# Patient Record
Sex: Female | Born: 1990
Health system: Southern US, Community
[De-identification: ages and names within clinical notes are randomized; demographics above are authoritative.]

## PROBLEM LIST (undated history)

## (undated) DIAGNOSIS — G4730 Sleep apnea, unspecified: Secondary | ICD-10-CM

## (undated) DIAGNOSIS — F319 Bipolar disorder, unspecified: Secondary | ICD-10-CM

## (undated) DIAGNOSIS — R159 Full incontinence of feces: Secondary | ICD-10-CM

## (undated) DIAGNOSIS — R519 Headache, unspecified: Secondary | ICD-10-CM

## (undated) DIAGNOSIS — M1990 Unspecified osteoarthritis, unspecified site: Secondary | ICD-10-CM

## (undated) DIAGNOSIS — F32A Depression, unspecified: Secondary | ICD-10-CM

## (undated) DIAGNOSIS — F79 Unspecified intellectual disabilities: Secondary | ICD-10-CM

## (undated) DIAGNOSIS — F329 Major depressive disorder, single episode, unspecified: Secondary | ICD-10-CM

## (undated) DIAGNOSIS — E119 Type 2 diabetes mellitus without complications: Secondary | ICD-10-CM

## (undated) HISTORY — PX: TOOTH EXTRACTION: SUR596

## (undated) HISTORY — DX: Unspecified intellectual disabilities: F79

## (undated) HISTORY — DX: Full incontinence of feces: R15.9

## (undated) HISTORY — PX: FOOT SURGERY: SHX648

---
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
## 2009-01-13 ENCOUNTER — Other Ambulatory Visit: Admission: RE | Admit: 2009-01-13 | Discharge: 2009-01-13 | Payer: Self-pay | Admitting: Family Medicine

## 2010-04-14 ENCOUNTER — Other Ambulatory Visit: Admission: RE | Admit: 2010-04-14 | Discharge: 2010-04-14 | Payer: Self-pay | Admitting: Family Medicine

## 2011-03-07 ENCOUNTER — Ambulatory Visit: Payer: Medicaid Other | Attending: Orthopedic Surgery | Admitting: Physical Therapy

## 2011-03-07 DIAGNOSIS — IMO0001 Reserved for inherently not codable concepts without codable children: Secondary | ICD-10-CM | POA: Insufficient documentation

## 2011-03-07 DIAGNOSIS — M25669 Stiffness of unspecified knee, not elsewhere classified: Secondary | ICD-10-CM | POA: Insufficient documentation

## 2011-03-07 DIAGNOSIS — M25569 Pain in unspecified knee: Secondary | ICD-10-CM | POA: Insufficient documentation

## 2011-03-08 ENCOUNTER — Encounter: Payer: Medicaid Other | Admitting: Physical Therapy

## 2011-03-14 ENCOUNTER — Ambulatory Visit: Payer: Medicaid Other | Admitting: Physical Therapy

## 2011-03-29 ENCOUNTER — Encounter: Payer: Medicaid Other | Admitting: Physical Therapy

## 2011-04-04 ENCOUNTER — Ambulatory Visit: Payer: Medicaid Other | Attending: Orthopedic Surgery | Admitting: Physical Therapy

## 2011-04-04 DIAGNOSIS — M25669 Stiffness of unspecified knee, not elsewhere classified: Secondary | ICD-10-CM | POA: Insufficient documentation

## 2011-04-04 DIAGNOSIS — IMO0001 Reserved for inherently not codable concepts without codable children: Secondary | ICD-10-CM | POA: Insufficient documentation

## 2011-04-04 DIAGNOSIS — M25569 Pain in unspecified knee: Secondary | ICD-10-CM | POA: Insufficient documentation

## 2011-04-18 ENCOUNTER — Ambulatory Visit: Payer: Medicaid Other | Admitting: Physical Therapy

## 2011-05-30 LAB — RAPID URINE DRUG SCREEN, HOSP PERFORMED
Barbiturates: NOT DETECTED
Benzodiazepines: NOT DETECTED
Cocaine: NOT DETECTED

## 2011-05-30 LAB — COMPREHENSIVE METABOLIC PANEL
Alkaline Phosphatase: 74
BUN: 9
Chloride: 107
Creatinine, Ser: 0.73
Glucose, Bld: 93
Potassium: 3.9
Total Bilirubin: 0.8

## 2011-05-30 LAB — DIFFERENTIAL
Basophils Absolute: 0.1
Eosinophils Absolute: 0.1
Lymphocytes Relative: 27
Monocytes Absolute: 0.4
Neutrophils Relative %: 64

## 2011-05-30 LAB — URINALYSIS, ROUTINE W REFLEX MICROSCOPIC
Bilirubin Urine: NEGATIVE
Glucose, UA: NEGATIVE
Ketones, ur: NEGATIVE
Leukocytes, UA: NEGATIVE
Nitrite: NEGATIVE
Protein, ur: 30 — AB
pH: 6

## 2011-05-30 LAB — CBC
HCT: 37.6
Hemoglobin: 12.8
MCV: 88.3
Platelets: 113 — ABNORMAL LOW
RDW: 13
WBC: 5.2

## 2011-05-30 LAB — URINE MICROSCOPIC-ADD ON

## 2011-05-30 LAB — ETHANOL: Alcohol, Ethyl (B): <5

## 2011-07-30 ENCOUNTER — Emergency Department (INDEPENDENT_AMBULATORY_CARE_PROVIDER_SITE_OTHER)
Admission: EM | Admit: 2011-07-30 | Discharge: 2011-07-30 | Disposition: A | Discharge status: Nursing Home (Includes ICF) | Payer: Medicare Other | Source: Home / Self Care | Attending: Emergency Medicine | Admitting: Emergency Medicine

## 2011-07-30 ENCOUNTER — Encounter (HOSPITAL_COMMUNITY): Payer: Self-pay | Admitting: *Deleted

## 2011-07-30 ENCOUNTER — Emergency Department (HOSPITAL_COMMUNITY)
Admission: EM | Admit: 2011-07-30 | Discharge: 2011-07-31 | Disposition: A | Discharge status: Home / Self Care | Payer: Medicaid Other | Attending: Emergency Medicine | Admitting: Emergency Medicine

## 2011-07-30 ENCOUNTER — Encounter: Payer: Self-pay | Admitting: Emergency Medicine

## 2011-07-30 ENCOUNTER — Emergency Department (INDEPENDENT_AMBULATORY_CARE_PROVIDER_SITE_OTHER): Payer: Medicare Other

## 2011-07-30 DIAGNOSIS — R109 Unspecified abdominal pain: Secondary | ICD-10-CM

## 2011-07-30 DIAGNOSIS — K5900 Constipation, unspecified: Secondary | ICD-10-CM | POA: Insufficient documentation

## 2011-07-30 DIAGNOSIS — R1110 Vomiting, unspecified: Secondary | ICD-10-CM | POA: Insufficient documentation

## 2011-07-30 LAB — POCT PREGNANCY, URINE: Preg Test, Ur: NEGATIVE

## 2011-07-30 IMAGING — CR DG ABDOMEN 1V
1 series · 1 of 1 positions shown · non-contrast
Comparison: None.

CLINICAL DATA: Left flank pain for 2-3 weeks

ABDOMEN - 1 VIEW

[view not recorded]
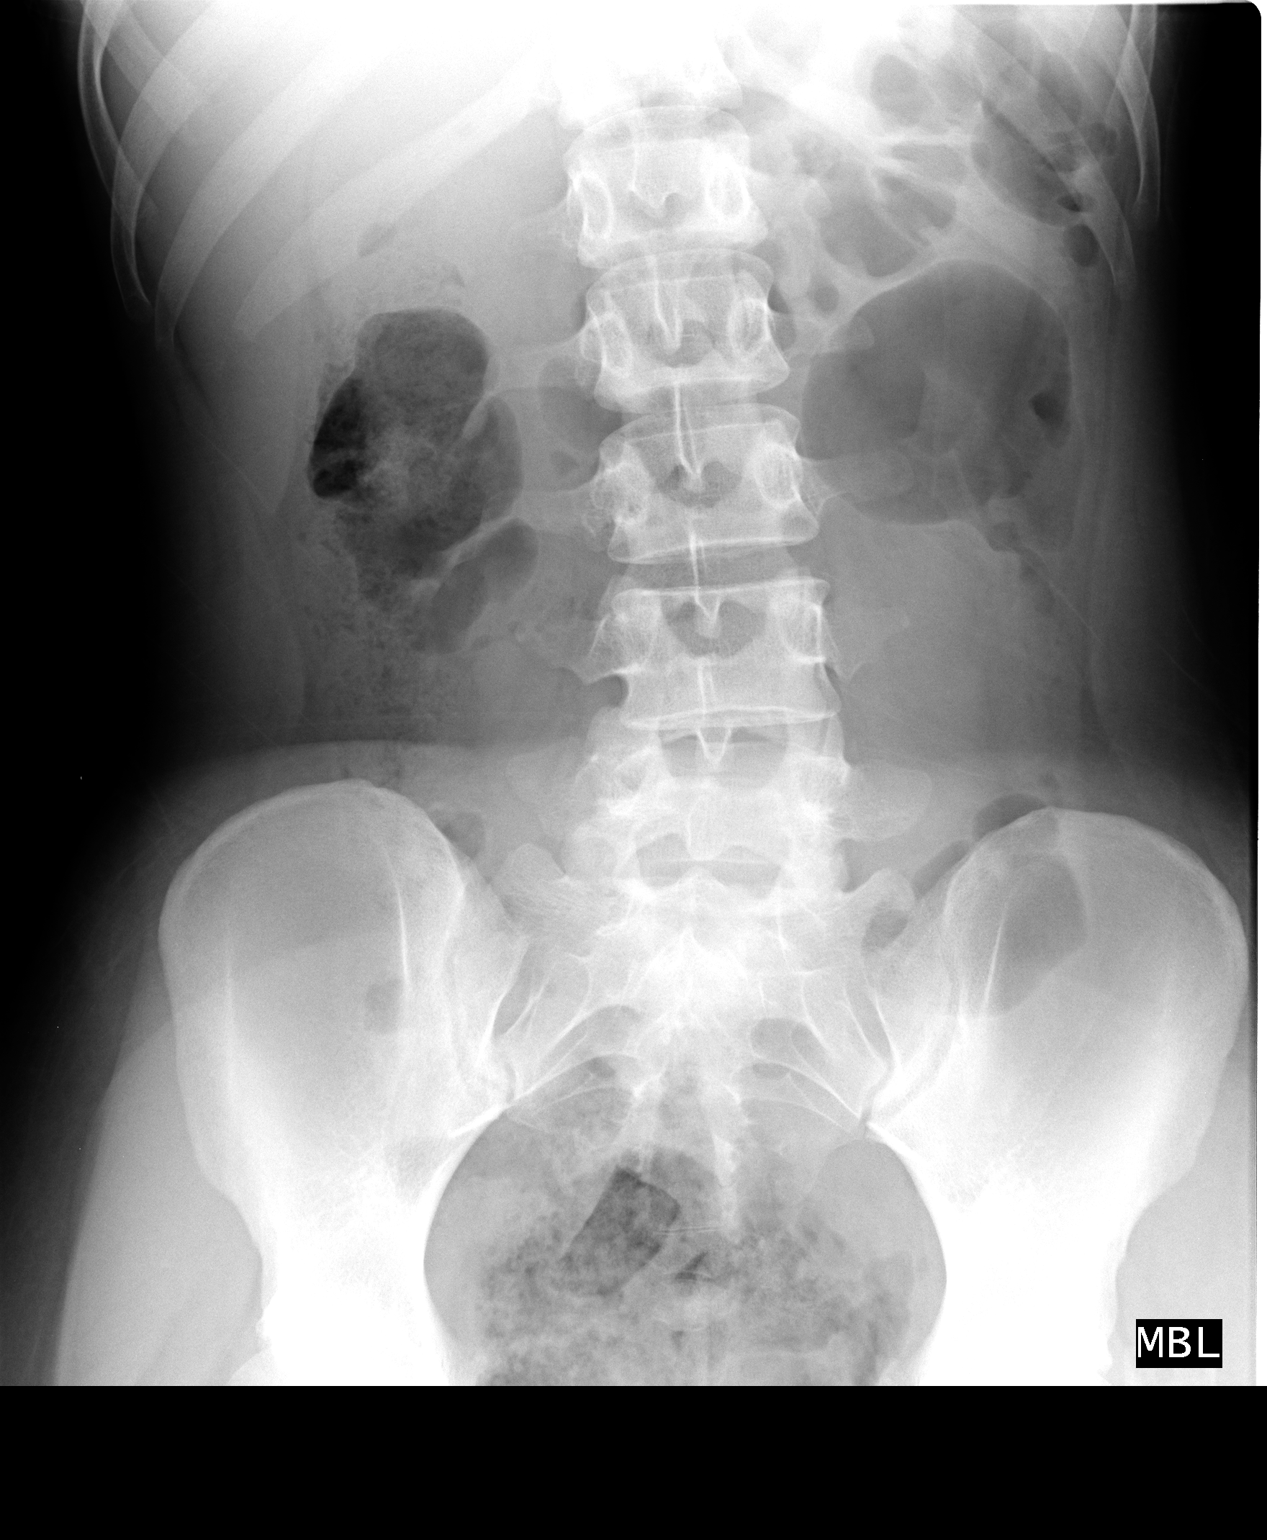

[1 of 1 positions shown; findings below may reference images not displayed]

FINDINGS: There is focal gaseous distention of a portion of the
bowel loop in the left abdomen.  This is favored to be focal
dilatation of a small segment of the colon. Otherwise, the bowel
gas pattern is within normal limits.  No significant stool burden.
No radiopaque urinary tract calculus. No evidence of free
peritoneal air on this supine view of the abdomen.  No acute or
suspicious bony abnormality.
IMPRESSION: Focal bowel dilatation in the left abdomen, favored to be colonic.
It is uncertain if this could be transient gaseous distention
related to peristalsis, or pathologic dilatation.

## 2011-07-30 NOTE — ED Provider Notes (Signed)
History     CSN: [id number] Arrival date & time: 07/30/2011  5:32 PM   First MD Initiated Contact with Patient 07/30/11 1707      Chief Complaint  Patient presents with  . Abdominal Cramping  . Constipation  . Emesis    (Consider location/radiation/quality/duration/timing/severity/associated sxs/prior treatment) Patient is a 20 y.o. female presenting with cramps, constipation, and vomiting. The history is provided by the patient and a relative.  Abdominal Cramping The primary symptoms of the illness include abdominal pain and vomiting. The primary symptoms of the illness do not include fever, fatigue, shortness of breath or vaginal discharge. The current episode started more than 2 days ago (patient vague historian" I don't know maybe 2 weeks"). The problem has been gradually worsening.  Additional symptoms associated with the illness include constipation. Symptoms associated with the illness do not include chills, anorexia, diaphoresis, urgency, hematuria, frequency or back pain. Significant associated medical issues do not include PUD.  Constipation  Associated symptoms include abdominal pain and vomiting. Pertinent negatives include no anorexia, no fever, no hematuria, no vaginal discharge and no coughing.  Emesis  Associated symptoms include abdominal pain. Pertinent negatives include no chills, no cough and no fever.    History reviewed. No pertinent past medical history.  History reviewed. No pertinent past surgical history.  No family history on file.  History  Substance Use Topics  . Smoking status: Never Smoker   . Smokeless tobacco: Not on file  . Alcohol Use: No    OB History    Grav Para Term Preterm Abortions TAB SAB Ect Mult Living                  Review of Systems  Constitutional: Negative for fever, chills, diaphoresis and fatigue.  Respiratory: Negative for cough and shortness of breath.   Gastrointestinal: Positive for vomiting, abdominal pain and  constipation. Negative for anorexia.  Genitourinary: Negative for urgency, frequency, hematuria and vaginal discharge.  Musculoskeletal: Negative for back pain.    Allergies  Review of patient's allergies indicates no known allergies.  Home Medications   Current Outpatient Rx  Name Route Sig Dispense Refill  . ARIPIPRAZOLE 30 MG PO TABS Oral Take 30 mg by mouth daily.      . SERTRALINE HCL 50 MG PO TABS Oral Take 50 mg by mouth daily.        BP 127/80  Pulse 86  Temp(Src) 98.1 F (36.7 C) (Oral)  Resp 16  SpO2 100%  Physical Exam  Nursing note and vitals reviewed. Constitutional: She appears well-developed and well-nourished. No distress.  HENT:  Head: Normocephalic.  Eyes: Conjunctivae are normal. Pupils are equal, round, and reactive to light.  Abdominal: Soft. She exhibits no shifting dullness, no distension, no ascites, no pulsatile midline mass and no mass. There is no hepatosplenomegaly. There is tenderness in the right lower quadrant, periumbilical area and left upper quadrant. There is rigidity. There is no rebound, no guarding, no tenderness at McBurney's point and negative Murphy's sign. No hernia.  Lymphadenopathy:    She has no cervical adenopathy.    ED Course  Procedures (including critical care time)   Labs Reviewed  POCT PREGNANCY, URINE  POCT PREGNANCY, URINE   Dg Abd 1 View  07/30/2011  *RADIOLOGY REPORT*  Clinical Data: Left flank pain for 2-3 weeks  ABDOMEN - 1 VIEW  Comparison: None.  Findings: There is focal gaseous distention of a portion of the bowel loop in the left abdomen.  This  is favored to be focal dilatation of a small segment of the colon. Otherwise, the bowel gas pattern is within normal limits.  No significant stool burden. No radiopaque urinary tract calculus. No evidence of free peritoneal air on this supine view of the abdomen.  No acute or suspicious bony abnormality.  IMPRESSION: Focal bowel dilatation in the left abdomen, favored to  be colonic. It is uncertain if this could be transient gaseous distention related to peristalsis, or pathologic dilatation.  Original Report Authenticated By: Britta Mccreedy, M.D.     1. Abdominal pain   2. Constipation       MDM  Ongoing abdominal pian x 2 weeks- Concerning exam- single small bowel loop (isolated) pathological? No fevers, No diarrheas. Afebrile transferred to ED for further evaluation.        Cindy Molly, MD 07/30/11 [phone]

## 2011-07-30 NOTE — ED Notes (Signed)
Pt c/o lt and pain for 2 weeks no nv or diarrhea

## 2011-07-30 NOTE — ED Notes (Signed)
Pt here with c/o abdominal intermitt cramping and pain in lower abd with vomiting and unable to tell the last time she had bm.sx started 07/23/11.no fever,chills.pt reports of x 5 emesis today

## 2011-08-06 ENCOUNTER — Encounter (HOSPITAL_COMMUNITY): Payer: Self-pay | Admitting: Emergency Medicine

## 2011-08-06 ENCOUNTER — Emergency Department (HOSPITAL_COMMUNITY): Payer: Medicaid Other

## 2011-08-06 ENCOUNTER — Emergency Department (HOSPITAL_COMMUNITY)
Admission: EM | Admit: 2011-08-06 | Discharge: 2011-08-06 | Disposition: A | Discharge status: Home / Self Care | Payer: Medicaid Other | Attending: Emergency Medicine | Admitting: Emergency Medicine

## 2011-08-06 DIAGNOSIS — R51 Headache: Secondary | ICD-10-CM | POA: Insufficient documentation

## 2011-08-06 DIAGNOSIS — K5904 Chronic idiopathic constipation: Secondary | ICD-10-CM

## 2011-08-06 DIAGNOSIS — R1084 Generalized abdominal pain: Secondary | ICD-10-CM | POA: Insufficient documentation

## 2011-08-06 DIAGNOSIS — K5909 Other constipation: Secondary | ICD-10-CM | POA: Insufficient documentation

## 2011-08-06 LAB — URINALYSIS, ROUTINE W REFLEX MICROSCOPIC
Glucose, UA: NEGATIVE mg/dL
Ketones, ur: 15 mg/dL — AB
Leukocytes, UA: NEGATIVE
Nitrite: NEGATIVE
Protein, ur: NEGATIVE mg/dL

## 2011-08-06 LAB — POCT PREGNANCY, URINE: Preg Test, Ur: NEGATIVE

## 2011-08-06 IMAGING — CR DG ABDOMEN 1V
2 series · 2 of 2 positions shown · non-contrast
Comparison: [DATE]

CLINICAL DATA: Lower abdominal pain, constipation.

ABDOMEN - 1 VIEW

[t abdomen supine (1 of 2)]
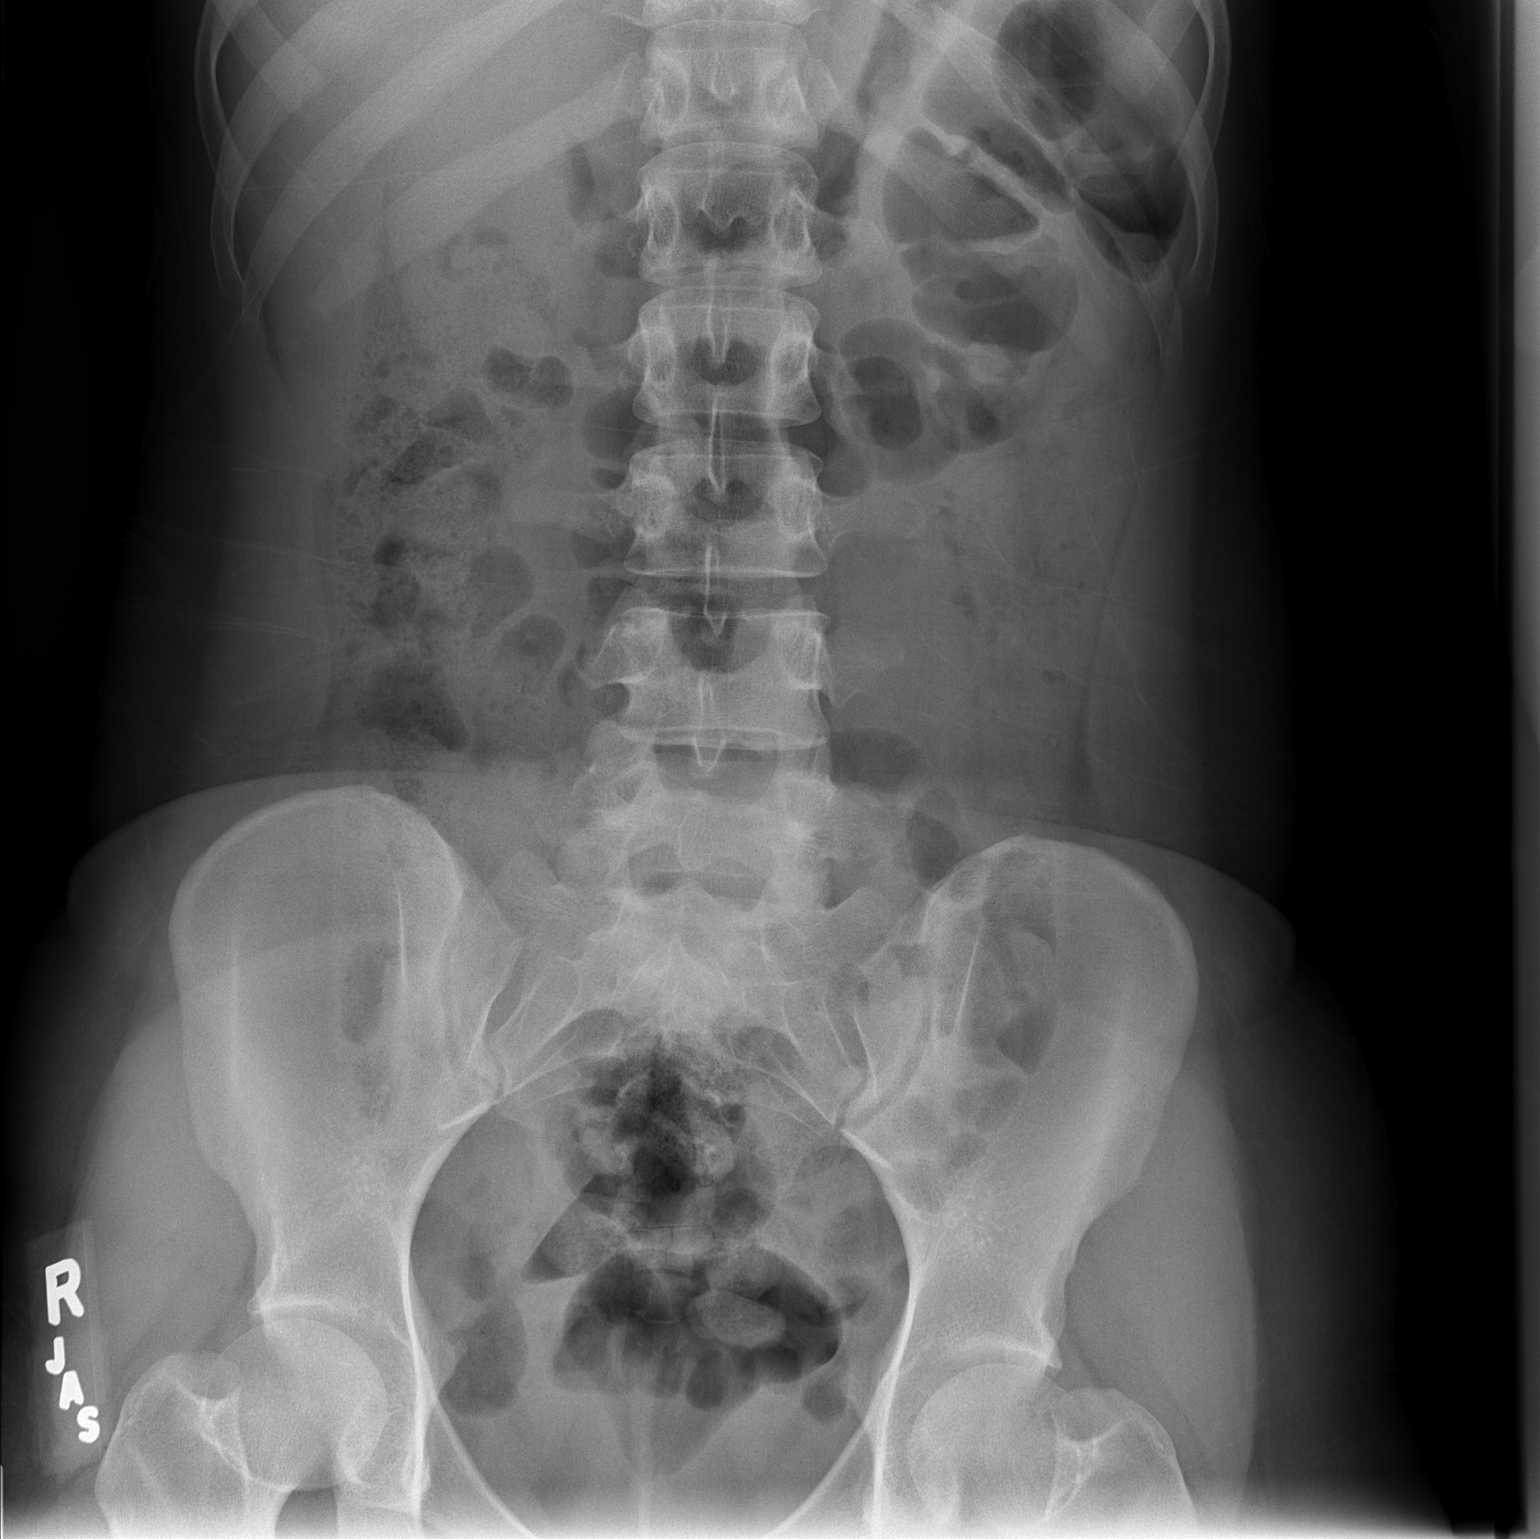

[t abdomen supine (2 of 2)]
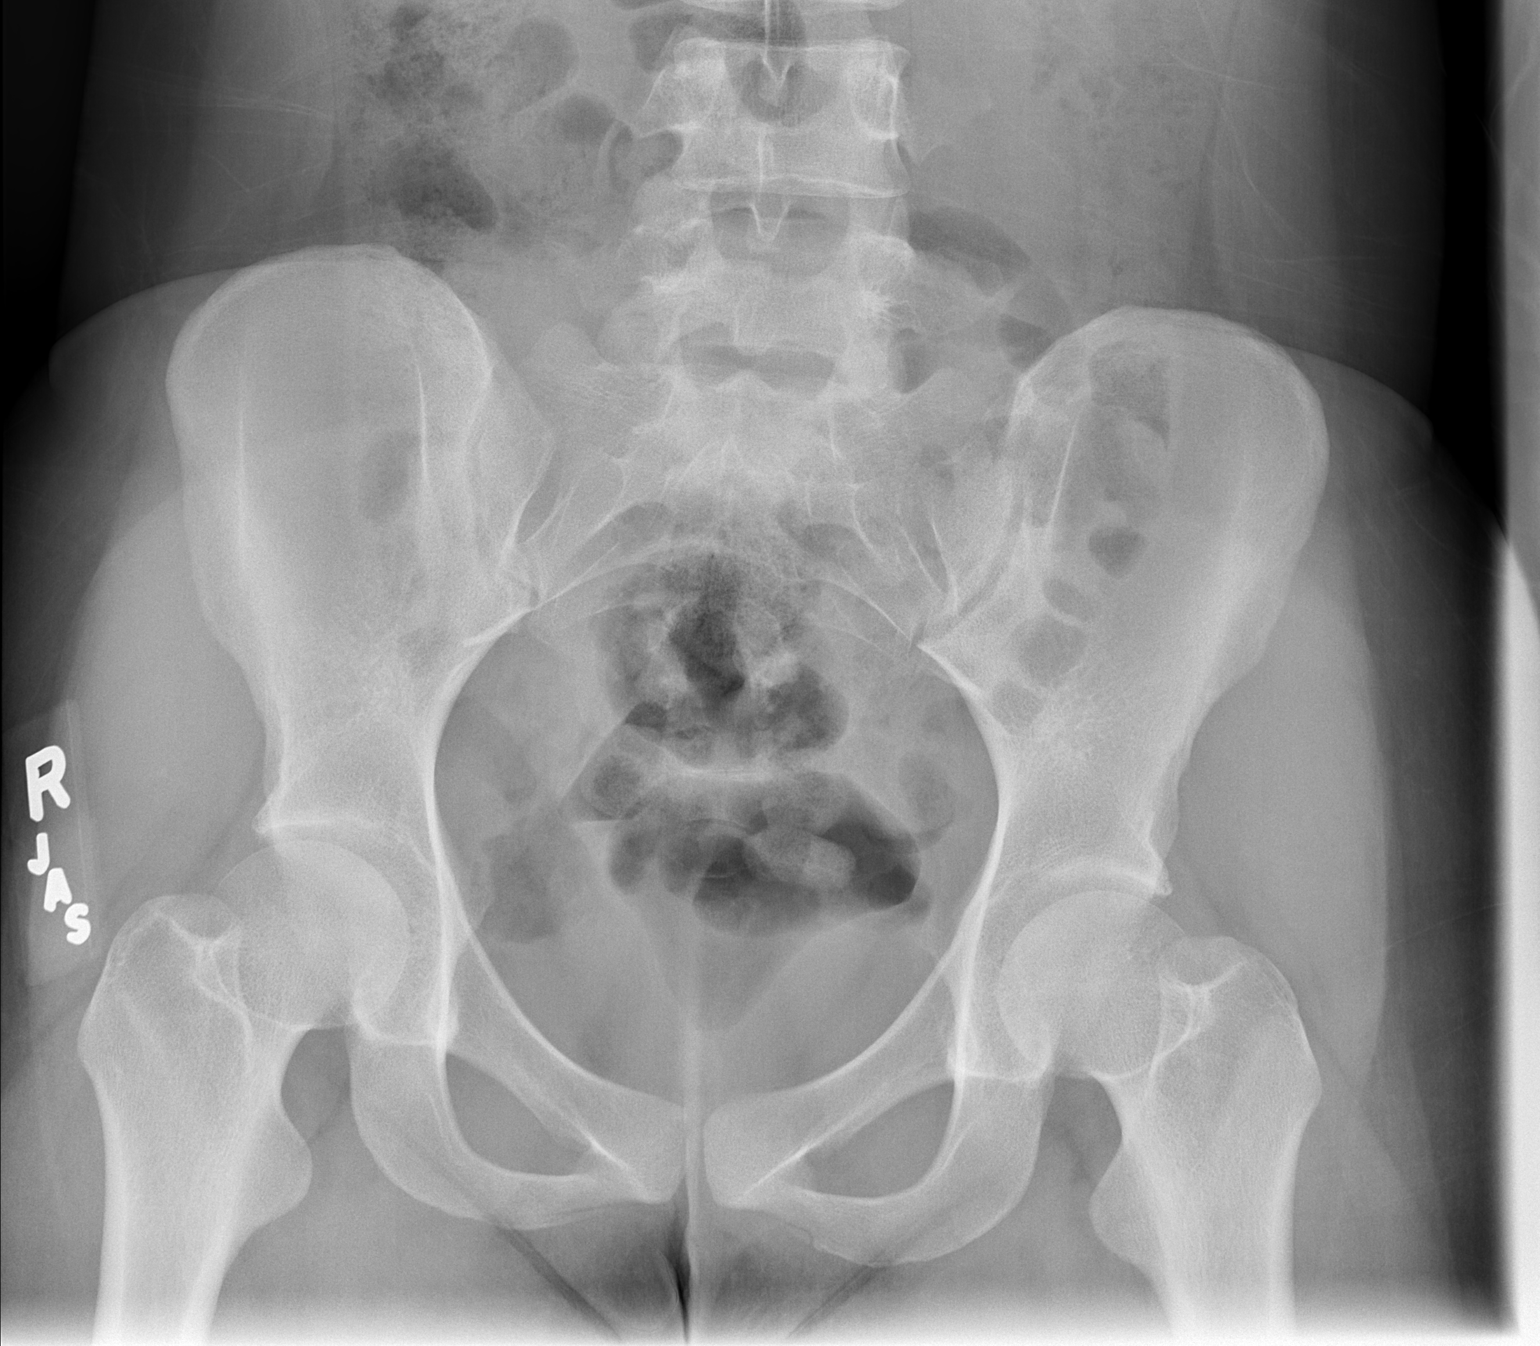

[2 of 2 positions shown; findings below may reference images not displayed]

FINDINGS: Hemidiaphragms are excluded from the image.
Nonobstructive bowel gas pattern. No abnormal loops of dilatation.
Organ outlines normal where seen.  No acute osseous abnormality.
IMPRESSION: Nonobstructive bowel gas pattern.

## 2011-08-06 MED ORDER — KETOROLAC TROMETHAMINE 30 MG/ML IJ SOLN: 30.0000 mg | Freq: Once | INTRAMUSCULAR | Status: DC

## 2011-08-06 MED ORDER — KETOROLAC TROMETHAMINE 60 MG/2ML IM SOLN
30.0000 mg | Freq: Once | INTRAMUSCULAR | Status: AC
  Administered 2011-08-06: 30 mg via INTRAMUSCULAR

## 2011-08-06 MED ORDER — KETOROLAC TROMETHAMINE 60 MG/2ML IM SOLN
INTRAMUSCULAR | Status: AC
  Administered 2011-08-06: 30 mg via INTRAMUSCULAR
  Filled 2011-08-06: qty 2

## 2011-08-06 MED ORDER — BISACODYL 5 MG PO TBEC: 5.0000 mg | DELAYED_RELEASE_TABLET | Freq: Every day | ORAL | Status: AC | PRN

## 2011-08-06 NOTE — ED Notes (Signed)
rx x 1, pt voiced understanding to f/u with [facility] and return for worsening of condition

## 2011-08-06 NOTE — ED Provider Notes (Signed)
History     CSN: [id number] Arrival date & time: 08/06/2011  4:27 PM   None     Chief Complaint  Patient presents with  . Abdominal Pain  . Headache    (Consider location/radiation/quality/duration/timing/severity/associated sxs/prior treatment) HPI Comments: Patient reports, that she has not had a bowel movement in over 2, weeks.  She's tried any over-the-counter meds to treat and MiraLAX without results.  Now having mild diffuse abdominal discomfort.  Also reports a frontal headache for a week has intermittently tried ibuprofen by mouth without relief.  She last took any medication for her headache 3 days ago.  Denies nasal congestion, seasonal allergies, sore throat, UTI symptoms, visual disturbances blurry vision, change in her eyeglass prescription.  Patient is a 20 y.o. female presenting with abdominal pain and headaches. The history is provided by the patient.  Abdominal Pain The primary symptoms of the illness include abdominal pain. The primary symptoms of the illness do not include fever, shortness of breath, nausea, vomiting, diarrhea, dysuria or vaginal discharge. The current episode started more than 2 days ago. The onset of the illness was gradual. The problem has been gradually worsening.  The abdominal pain began more than 2 days ago. The pain came on gradually. The abdominal pain has been gradually worsening since its onset. The abdominal pain is generalized. The severity of the abdominal pain is 6/10. The abdominal pain is exacerbated by certain positions.  The patient states that she believes she is currently not pregnant. Additional symptoms associated with the illness include constipation. Symptoms associated with the illness do not include frequency or back pain.  Headache  Pertinent negatives include no fever, no shortness of breath, no nausea and no vomiting.    History reviewed. No pertinent past medical history.  History reviewed. No pertinent past surgical  history.  History reviewed. No pertinent family history.  History  Substance Use Topics  . Smoking status: Never Smoker   . Smokeless tobacco: Not on file  . Alcohol Use: No    OB History    Grav Para Term Preterm Abortions TAB SAB Ect Mult Living                  Review of Systems  Constitutional: Negative for fever.  HENT: Negative for congestion, sore throat, rhinorrhea, neck pain, dental problem, sinus pressure and ear discharge.   Eyes: Negative for visual disturbance.  Respiratory: Negative for cough and shortness of breath.   Cardiovascular: Negative for chest pain.  Gastrointestinal: Positive for abdominal pain and constipation. Negative for nausea, vomiting and diarrhea.  Genitourinary: Negative for dysuria, frequency and vaginal discharge.  Musculoskeletal: Negative for back pain.  Skin: Negative.   Neurological: Positive for headaches. Negative for dizziness and weakness.  Hematological: Negative.   Psychiatric/Behavioral: Negative.     Allergies  Review of patient's allergies indicates no known allergies.  Home Medications   Current Outpatient Rx  Name Route Sig Dispense Refill  . ARIPIPRAZOLE 30 MG PO TABS Oral Take 30 mg by mouth daily.     . SERTRALINE HCL 50 MG PO TABS Oral Take 50 mg by mouth daily.       BP 93/59  Pulse 74  Temp(Src) 97.4 F (36.3 C) (Oral)  Resp 16  Ht 6' (1.829 m)  Wt 150 lb (68.04 kg)  BMI 20.34 kg/m2  SpO2 100%  Physical Exam  ED Course  Procedures (including critical care time)  Labs Reviewed  URINALYSIS, ROUTINE W REFLEX MICROSCOPIC - Abnormal;  Notable for the following:    Bilirubin Urine SMALL (*)    Ketones, ur 15 (*)    All other components within normal limits  POCT PREGNANCY, URINE  POCT PREGNANCY, URINE   Dg Abd 1 View  08/06/2011  *RADIOLOGY REPORT*  Clinical Data: Lower abdominal pain, constipation.  ABDOMEN - 1 VIEW  Comparison: 07/30/2011  Findings: Hemidiaphragms are excluded from the image.  Nonobstructive bowel gas pattern. No abnormal loops of dilatation. Organ outlines normal where seen.  No acute osseous abnormality.  IMPRESSION: Nonobstructive bowel gas pattern.  Original Report Authenticated By: Waneta Martins, M.D.     1. Constipation - functional   2. Headache       MDM  This is most likely simple constipation.  Will check a KUB.  Will treat headache with IM Toradol and reassess        Arman Filter, NP 08/06/11 2136  Arman Filter, NP 08/06/11 2318  Arman Filter, NP 08/06/11 2318  Arman Filter, NP 08/06/11 [phone]

## 2011-08-06 NOTE — ED Notes (Signed)
Pt c/o lower abd pain and HA x 1 week; pt does not know when last period was; pt denies UTI sx of vaginal discharge

## 2011-08-08 NOTE — ED Provider Notes (Signed)
Medical screening examination/treatment/procedure(s) were performed by non-physician practitioner and as supervising physician I was immediately available for consultation/collaboration.  Rayola Everhart, MD 08/08/11 0923 

## 2011-11-20 ENCOUNTER — Ambulatory Visit (INDEPENDENT_AMBULATORY_CARE_PROVIDER_SITE_OTHER): Payer: Medicare Other | Admitting: Family Medicine

## 2011-11-20 VITALS — BP 115/75 | HR 60 | Temp 98.6°F | Resp 16 | Ht 72.0 in | Wt 161.2 lb

## 2011-11-20 DIAGNOSIS — M25561 Pain in right knee: Secondary | ICD-10-CM

## 2011-11-20 DIAGNOSIS — F319 Bipolar disorder, unspecified: Secondary | ICD-10-CM

## 2011-11-20 DIAGNOSIS — M25559 Pain in unspecified hip: Secondary | ICD-10-CM

## 2011-11-20 DIAGNOSIS — M222X9 Patellofemoral disorders, unspecified knee: Secondary | ICD-10-CM

## 2011-11-20 DIAGNOSIS — F3289 Other specified depressive episodes: Secondary | ICD-10-CM

## 2011-11-20 DIAGNOSIS — M12269 Villonodular synovitis (pigmented), unspecified knee: Secondary | ICD-10-CM

## 2011-11-20 NOTE — Progress Notes (Signed)
  Subjective:    Patient ID: Cindy Phillips, female    DOB: May 12, 1991, 21 y.o.   MRN: [id number]  HPI Cindy Phillips is a 21 y.o. female Prior pt of [facility] - wants to transfer care here.  Will schedule physical and obtain records for establishing care.  R knee pain - started 2 weeks ago - NKI,  No prior R knee pain, but has sprained L knee 2/12.  Saw  Dr. Turner Daniels at [facility]. No swelling.  Tx: ice, no other tx's.  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  Non smoker.  Has form for therapy - will be having therapy with Cindy Phillips - to help with anger.  Hx of Bipolar disorder, psychiatrist at [facility] center.     Review of Systems  Psychiatric/Behavioral: Positive for dysphoric mood. The patient is nervous/anxious.        Objective:   Physical Exam  Constitutional: She is oriented to person, place, and time. She appears well-developed and well-nourished.  HENT:  Head: Normocephalic and atraumatic.  Pulmonary/Chest: Effort normal.  Musculoskeletal:       Right knee: She exhibits normal range of motion, no swelling, no effusion, no ecchymosis, no deformity, normal alignment, no LCL laxity and normal patellar mobility.       Legs: Neurological: She is alert and oriented to person, place, and time.  Skin: Skin is warm and dry.  Psychiatric: She has a normal mood and affect. Her behavior is normal.       Good eye contact.       Assessment & Plan:  Cindy Phillips is a 21 y.o. female 1.. right knee pain. suspected patellofemoral pain syndrome. No apparent effusion on exam guarded for anterior cruciate ligament testing but overall exam was reassuring. Hand out given from sports medicine database: Patellofemoral pain syndrome and discussed VMO strengthening. Can use over-the-counter ibuprofen or Aleve as needed and recheck in the next 2 weeks if not improving. Discussed x-ray today versus at recheck in 2 weeks - pt would prefer to wait until 2 weeks  recheck.  2. bipolar disorder, currently followed by psychiatrist at the [facility] center, and has a therapist scheduled. Paperwork completed for therapy/counseling.  Patient plans on arranging primary care here.  we'll obtain a release of information from her previous physician and can schedule a physical at [address].

## 2012-03-13 DIAGNOSIS — Z01419 Encounter for gynecological examination (general) (routine) without abnormal findings: Secondary | ICD-10-CM | POA: Diagnosis not present

## 2012-03-13 DIAGNOSIS — Z124 Encounter for screening for malignant neoplasm of cervix: Secondary | ICD-10-CM | POA: Diagnosis not present

## 2012-03-13 DIAGNOSIS — Z113 Encounter for screening for infections with a predominantly sexual mode of transmission: Secondary | ICD-10-CM | POA: Diagnosis not present

## 2012-03-28 DIAGNOSIS — Z3049 Encounter for surveillance of other contraceptives: Secondary | ICD-10-CM | POA: Diagnosis not present

## 2012-05-08 DIAGNOSIS — IMO0002 Reserved for concepts with insufficient information to code with codable children: Secondary | ICD-10-CM | POA: Diagnosis not present

## 2012-05-20 DIAGNOSIS — M1710 Unilateral primary osteoarthritis, unspecified knee: Secondary | ICD-10-CM | POA: Diagnosis not present

## 2012-05-28 DIAGNOSIS — M25569 Pain in unspecified knee: Secondary | ICD-10-CM | POA: Diagnosis not present

## 2012-06-03 ENCOUNTER — Ambulatory Visit: Payer: Self-pay

## 2012-06-04 ENCOUNTER — Ambulatory Visit (INDEPENDENT_AMBULATORY_CARE_PROVIDER_SITE_OTHER): Payer: Medicare Other | Admitting: Emergency Medicine

## 2012-06-04 VITALS — BP 110/72 | HR 80 | Temp 98.2°F | Resp 18 | Wt 162.0 lb

## 2012-06-04 DIAGNOSIS — H1030 Unspecified acute conjunctivitis, unspecified eye: Secondary | ICD-10-CM | POA: Diagnosis not present

## 2012-06-04 DIAGNOSIS — J018 Other acute sinusitis: Secondary | ICD-10-CM | POA: Diagnosis not present

## 2012-06-04 MED ORDER — AMOXICILLIN-POT CLAVULANATE 875-125 MG PO TABS: 1.0000 | ORAL_TABLET | Freq: Two times a day (BID) | ORAL | Status: DC

## 2012-06-04 MED ORDER — PSEUDOEPHEDRINE-GUAIFENESIN ER 60-600 MG PO TB12: 1.0000 | ORAL_TABLET | Freq: Two times a day (BID) | ORAL | Status: AC

## 2012-06-04 MED ORDER — SULFACETAMIDE SODIUM 10 % OP SOLN: 1.0000 [drp] | OPHTHALMIC | Status: DC

## 2012-06-04 NOTE — Progress Notes (Signed)
[facility] [address] [place] [address] [phone]  Date:  06/04/2012   Name:  Cindy Phillips   DOB:  October 18, 1990   MRN:  [id number]  PCP:  Per Patient No Pcp    Chief Complaint: Eye Pain   History of Present Illness:  Cindy Phillips is a 21 y.o. very pleasant female patient who presents with the following:  1 week duration of nasal congestion with a purulent drainage. No fever or chills.  Has non productive cough.  No wheezing or shortness of breath.  Has a sore throat.  This morning her left eye was glued shut.  Not painful and no light sensitivity or FB sensation.  Injected and red with yellow drainage.  There is no problem list on file for this patient.   No past medical history on file.  No past surgical history on file.  History  Substance Use Topics  . Smoking status: Never Smoker   . Smokeless tobacco: Not on file  . Alcohol Use: No    No family history on file.  No Known Allergies  Medication list has been reviewed and updated.  Current Outpatient Prescriptions on File Prior to Visit  Medication Sig Dispense Refill  . ARIPiprazole (ABILIFY) 30 MG tablet Take 10 mg by mouth daily.       . sertraline (ZOLOFT) 50 MG tablet Take 50 mg by mouth daily.         Review of Systems:  As per HPI, otherwise negative.    Physical Examination: Filed Vitals:   06/04/12 1502  BP: 110/72  Pulse: 80  Temp: 98.2 F (36.8 C)  Resp: 18   Filed Vitals:   06/04/12 1502  Weight: 162 lb (73.483 kg)   There is no height on file to calculate BMI. Ideal Body Weight:    GEN: WDWN, NAD, Non-toxic, A & O x 3.  No shortness of breath or sepsis or rash HEENT: Atraumatic, Normocephalic. Neck supple. No masses, No LAD.  Oropharynx negative.  Left scleral injection with drainage.  No FB or abrasion Ears and Nose: No external deformity.  TM negative.  Green nasal drainage. CV: RRR, No M/G/R. No JVD. No thrill. No extra heart sounds. PULM:  CTA B, no wheezes, crackles, rhonchi. No retractions. No resp. distress. No accessory muscle use. ABD: S, NT, ND, +BS. No rebound. No HSM. EXTR: No c/c/e NEURO Normal gait.  PSYCH: Normally interactive. Conversant. Not depressed or anxious appearing.  Calm demeanor.    Assessment and Plan: Conjunctivitis:  Sulfacetamide Sinusitis;  Augmentin, mucinex d Follow up as needed  Carmelina Dane, MD  I have reviewed and agree with documentation. Robert P. Merla Riches, M.D.

## 2012-06-05 ENCOUNTER — Ambulatory Visit: Payer: Medicare Other | Admitting: Rehabilitation

## 2012-06-12 ENCOUNTER — Ambulatory Visit: Payer: Medicare Other | Attending: Orthopedic Surgery | Admitting: Physical Therapy

## 2012-06-12 DIAGNOSIS — IMO0001 Reserved for inherently not codable concepts without codable children: Secondary | ICD-10-CM | POA: Diagnosis not present

## 2012-06-12 DIAGNOSIS — M25569 Pain in unspecified knee: Secondary | ICD-10-CM | POA: Insufficient documentation

## 2012-06-16 ENCOUNTER — Ambulatory Visit: Payer: Medicare Other | Admitting: Physical Therapy

## 2012-06-18 ENCOUNTER — Ambulatory Visit: Payer: Medicare Other | Admitting: Physical Therapy

## 2012-06-23 ENCOUNTER — Ambulatory Visit: Payer: Medicare Other | Attending: Orthopedic Surgery | Admitting: Physical Therapy

## 2012-06-23 DIAGNOSIS — IMO0001 Reserved for inherently not codable concepts without codable children: Secondary | ICD-10-CM | POA: Insufficient documentation

## 2012-06-23 DIAGNOSIS — M25569 Pain in unspecified knee: Secondary | ICD-10-CM | POA: Insufficient documentation

## 2012-06-25 ENCOUNTER — Ambulatory Visit: Payer: Medicare Other | Admitting: Physical Therapy

## 2012-06-30 ENCOUNTER — Ambulatory Visit: Payer: Medicare Other | Admitting: Physical Therapy

## 2012-07-02 ENCOUNTER — Encounter: Payer: Medicare Other | Admitting: Physical Therapy

## 2012-07-07 ENCOUNTER — Encounter: Payer: Medicare Other | Admitting: Physical Therapy

## 2012-07-08 ENCOUNTER — Ambulatory Visit: Payer: Medicare Other | Admitting: Physical Therapy

## 2012-07-09 ENCOUNTER — Ambulatory Visit: Payer: Medicare Other | Admitting: Physical Therapy

## 2012-07-14 ENCOUNTER — Encounter: Payer: Medicare Other | Admitting: Physical Therapy

## 2012-07-16 ENCOUNTER — Encounter: Payer: Medicare Other | Admitting: Physical Therapy

## 2012-07-16 DIAGNOSIS — M25569 Pain in unspecified knee: Secondary | ICD-10-CM | POA: Diagnosis not present

## 2012-08-26 DIAGNOSIS — M25569 Pain in unspecified knee: Secondary | ICD-10-CM | POA: Diagnosis not present

## 2012-09-02 DIAGNOSIS — M19019 Primary osteoarthritis, unspecified shoulder: Secondary | ICD-10-CM | POA: Diagnosis not present

## 2012-09-05 DIAGNOSIS — Z3202 Encounter for pregnancy test, result negative: Secondary | ICD-10-CM | POA: Diagnosis not present

## 2012-09-05 DIAGNOSIS — Z3161 Procreative counseling and advice using natural family planning: Secondary | ICD-10-CM | POA: Diagnosis not present

## 2012-09-08 DIAGNOSIS — F319 Bipolar disorder, unspecified: Secondary | ICD-10-CM | POA: Diagnosis not present

## 2012-09-09 DIAGNOSIS — M25569 Pain in unspecified knee: Secondary | ICD-10-CM | POA: Diagnosis not present

## 2012-09-24 DIAGNOSIS — F70 Mild intellectual disabilities: Secondary | ICD-10-CM | POA: Diagnosis not present

## 2012-09-24 DIAGNOSIS — F3132 Bipolar disorder, current episode depressed, moderate: Secondary | ICD-10-CM | POA: Diagnosis not present

## 2012-10-16 DIAGNOSIS — F70 Mild intellectual disabilities: Secondary | ICD-10-CM | POA: Diagnosis not present

## 2012-10-16 DIAGNOSIS — F3132 Bipolar disorder, current episode depressed, moderate: Secondary | ICD-10-CM | POA: Diagnosis not present

## 2012-11-17 DIAGNOSIS — F319 Bipolar disorder, unspecified: Secondary | ICD-10-CM | POA: Diagnosis not present

## 2012-11-25 DIAGNOSIS — H6690 Otitis media, unspecified, unspecified ear: Secondary | ICD-10-CM | POA: Diagnosis not present

## 2012-11-25 DIAGNOSIS — Z3009 Encounter for other general counseling and advice on contraception: Secondary | ICD-10-CM | POA: Diagnosis not present

## 2013-01-19 DIAGNOSIS — F319 Bipolar disorder, unspecified: Secondary | ICD-10-CM | POA: Diagnosis not present

## 2013-02-11 DIAGNOSIS — R040 Epistaxis: Secondary | ICD-10-CM | POA: Diagnosis not present

## 2013-05-12 DIAGNOSIS — N390 Urinary tract infection, site not specified: Secondary | ICD-10-CM | POA: Diagnosis not present

## 2013-06-11 DIAGNOSIS — F319 Bipolar disorder, unspecified: Secondary | ICD-10-CM | POA: Diagnosis not present

## 2013-07-28 DIAGNOSIS — F319 Bipolar disorder, unspecified: Secondary | ICD-10-CM | POA: Diagnosis not present

## 2013-08-06 DIAGNOSIS — F70 Mild intellectual disabilities: Secondary | ICD-10-CM | POA: Diagnosis not present

## 2013-08-06 DIAGNOSIS — F3132 Bipolar disorder, current episode depressed, moderate: Secondary | ICD-10-CM | POA: Diagnosis not present

## 2013-09-23 DIAGNOSIS — N390 Urinary tract infection, site not specified: Secondary | ICD-10-CM | POA: Diagnosis not present

## 2013-09-23 DIAGNOSIS — N912 Amenorrhea, unspecified: Secondary | ICD-10-CM | POA: Diagnosis not present

## 2013-10-21 ENCOUNTER — Other Ambulatory Visit: Payer: Self-pay | Admitting: Family Medicine

## 2013-10-21 ENCOUNTER — Other Ambulatory Visit (HOSPITAL_COMMUNITY)
Admission: RE | Admit: 2013-10-21 | Discharge: 2013-10-21 | Disposition: A | Discharge status: Home / Self Care | Payer: Medicare Other | Source: Ambulatory Visit | Attending: Family Medicine | Admitting: Family Medicine

## 2013-10-21 DIAGNOSIS — N760 Acute vaginitis: Secondary | ICD-10-CM | POA: Insufficient documentation

## 2013-10-21 DIAGNOSIS — Z113 Encounter for screening for infections with a predominantly sexual mode of transmission: Secondary | ICD-10-CM | POA: Diagnosis not present

## 2013-10-21 DIAGNOSIS — Z124 Encounter for screening for malignant neoplasm of cervix: Secondary | ICD-10-CM | POA: Diagnosis not present

## 2013-10-21 DIAGNOSIS — E781 Pure hyperglyceridemia: Secondary | ICD-10-CM | POA: Diagnosis not present

## 2013-10-21 DIAGNOSIS — Z01419 Encounter for gynecological examination (general) (routine) without abnormal findings: Secondary | ICD-10-CM | POA: Diagnosis not present

## 2014-01-07 DIAGNOSIS — F319 Bipolar disorder, unspecified: Secondary | ICD-10-CM | POA: Diagnosis not present

## 2014-02-10 DIAGNOSIS — J029 Acute pharyngitis, unspecified: Secondary | ICD-10-CM | POA: Diagnosis not present

## 2014-02-11 DIAGNOSIS — Z0000 Encounter for general adult medical examination without abnormal findings: Secondary | ICD-10-CM | POA: Diagnosis not present

## 2014-02-18 DIAGNOSIS — J029 Acute pharyngitis, unspecified: Secondary | ICD-10-CM | POA: Diagnosis not present

## 2014-02-24 DIAGNOSIS — F259 Schizoaffective disorder, unspecified: Secondary | ICD-10-CM | POA: Diagnosis not present

## 2014-03-01 DIAGNOSIS — F259 Schizoaffective disorder, unspecified: Secondary | ICD-10-CM | POA: Diagnosis not present

## 2014-03-02 DIAGNOSIS — M25569 Pain in unspecified knee: Secondary | ICD-10-CM | POA: Diagnosis not present

## 2014-03-08 DIAGNOSIS — F259 Schizoaffective disorder, unspecified: Secondary | ICD-10-CM | POA: Diagnosis not present

## 2014-03-25 DIAGNOSIS — M9420 Chondromalacia, unspecified site: Secondary | ICD-10-CM | POA: Diagnosis not present

## 2014-03-29 DIAGNOSIS — F259 Schizoaffective disorder, unspecified: Secondary | ICD-10-CM | POA: Diagnosis not present

## 2014-03-30 DIAGNOSIS — M9420 Chondromalacia, unspecified site: Secondary | ICD-10-CM | POA: Diagnosis not present

## 2014-04-01 DIAGNOSIS — F319 Bipolar disorder, unspecified: Secondary | ICD-10-CM | POA: Diagnosis not present

## 2014-04-07 ENCOUNTER — Ambulatory Visit: Payer: Medicare Other | Attending: Orthopedic Surgery

## 2014-04-07 DIAGNOSIS — M6281 Muscle weakness (generalized): Secondary | ICD-10-CM | POA: Insufficient documentation

## 2014-04-07 DIAGNOSIS — M25569 Pain in unspecified knee: Secondary | ICD-10-CM | POA: Diagnosis not present

## 2014-04-07 DIAGNOSIS — IMO0001 Reserved for inherently not codable concepts without codable children: Secondary | ICD-10-CM | POA: Diagnosis not present

## 2014-04-14 ENCOUNTER — Ambulatory Visit: Payer: Medicare Other | Admitting: Physical Therapy

## 2014-04-14 DIAGNOSIS — IMO0001 Reserved for inherently not codable concepts without codable children: Secondary | ICD-10-CM | POA: Diagnosis not present

## 2014-04-20 ENCOUNTER — Encounter: Payer: Medicare Other | Admitting: Rehabilitation

## 2014-04-21 ENCOUNTER — Encounter: Payer: Medicare Other | Admitting: Physical Therapy

## 2014-04-21 ENCOUNTER — Ambulatory Visit: Payer: Medicare Other | Attending: Orthopedic Surgery | Admitting: Physical Therapy

## 2014-04-21 DIAGNOSIS — M25569 Pain in unspecified knee: Secondary | ICD-10-CM | POA: Diagnosis not present

## 2014-04-21 DIAGNOSIS — IMO0001 Reserved for inherently not codable concepts without codable children: Secondary | ICD-10-CM | POA: Insufficient documentation

## 2014-04-21 DIAGNOSIS — M6281 Muscle weakness (generalized): Secondary | ICD-10-CM | POA: Insufficient documentation

## 2014-04-23 ENCOUNTER — Ambulatory Visit: Payer: Medicare Other

## 2014-04-23 DIAGNOSIS — IMO0001 Reserved for inherently not codable concepts without codable children: Secondary | ICD-10-CM | POA: Diagnosis not present

## 2014-04-27 ENCOUNTER — Encounter: Payer: Medicare Other | Admitting: Rehabilitation

## 2014-04-28 ENCOUNTER — Ambulatory Visit: Payer: Medicare Other | Admitting: Physical Therapy

## 2014-04-28 DIAGNOSIS — IMO0001 Reserved for inherently not codable concepts without codable children: Secondary | ICD-10-CM | POA: Diagnosis not present

## 2014-05-04 ENCOUNTER — Encounter: Payer: Medicare Other | Admitting: Physical Therapy

## 2014-05-06 DIAGNOSIS — Z3009 Encounter for other general counseling and advice on contraception: Secondary | ICD-10-CM | POA: Diagnosis not present

## 2014-05-07 ENCOUNTER — Ambulatory Visit: Payer: Medicare Other

## 2014-05-07 DIAGNOSIS — IMO0001 Reserved for inherently not codable concepts without codable children: Secondary | ICD-10-CM | POA: Diagnosis not present

## 2014-05-10 ENCOUNTER — Ambulatory Visit: Payer: Medicare Other | Admitting: Physical Therapy

## 2014-05-10 DIAGNOSIS — IMO0001 Reserved for inherently not codable concepts without codable children: Secondary | ICD-10-CM | POA: Diagnosis not present

## 2014-05-13 DIAGNOSIS — F319 Bipolar disorder, unspecified: Secondary | ICD-10-CM | POA: Diagnosis not present

## 2014-05-14 ENCOUNTER — Ambulatory Visit: Payer: Medicare Other

## 2014-05-18 ENCOUNTER — Ambulatory Visit: Payer: Medicare Other

## 2014-05-18 DIAGNOSIS — IMO0001 Reserved for inherently not codable concepts without codable children: Secondary | ICD-10-CM | POA: Diagnosis not present

## 2014-05-20 DIAGNOSIS — M545 Low back pain: Secondary | ICD-10-CM | POA: Diagnosis not present

## 2014-05-20 DIAGNOSIS — M25561 Pain in right knee: Secondary | ICD-10-CM | POA: Diagnosis not present

## 2014-05-31 ENCOUNTER — Ambulatory Visit: Payer: Medicare Other

## 2014-06-08 ENCOUNTER — Ambulatory Visit: Payer: Medicare Other | Attending: Orthopedic Surgery | Admitting: Physical Therapy

## 2014-06-08 DIAGNOSIS — M545 Low back pain: Secondary | ICD-10-CM | POA: Insufficient documentation

## 2014-06-10 ENCOUNTER — Ambulatory Visit: Payer: Medicare Other | Admitting: Physical Therapy

## 2014-06-10 DIAGNOSIS — M545 Low back pain: Secondary | ICD-10-CM | POA: Diagnosis not present

## 2014-06-15 DIAGNOSIS — F322 Major depressive disorder, single episode, severe without psychotic features: Secondary | ICD-10-CM | POA: Diagnosis not present

## 2014-06-16 ENCOUNTER — Encounter: Payer: Medicare Other | Admitting: Rehabilitation

## 2014-06-28 ENCOUNTER — Ambulatory Visit: Payer: Medicare Other | Attending: Orthopedic Surgery | Admitting: Physical Therapy

## 2014-06-28 DIAGNOSIS — M545 Low back pain: Secondary | ICD-10-CM | POA: Insufficient documentation

## 2014-06-29 DIAGNOSIS — F322 Major depressive disorder, single episode, severe without psychotic features: Secondary | ICD-10-CM | POA: Diagnosis not present

## 2014-06-30 ENCOUNTER — Encounter: Payer: Medicare Other | Admitting: Physical Therapy

## 2014-07-08 ENCOUNTER — Ambulatory Visit: Payer: Medicare Other | Admitting: Rehabilitation

## 2014-07-08 DIAGNOSIS — F322 Major depressive disorder, single episode, severe without psychotic features: Secondary | ICD-10-CM | POA: Diagnosis not present

## 2014-07-19 ENCOUNTER — Emergency Department (HOSPITAL_COMMUNITY)
Admission: EM | Admit: 2014-07-19 | Discharge: 2014-07-20 | Disposition: A | Discharge status: Home / Self Care | Payer: Medicare Other | Attending: Emergency Medicine | Admitting: Emergency Medicine

## 2014-07-19 ENCOUNTER — Encounter (HOSPITAL_COMMUNITY): Payer: Self-pay | Admitting: Emergency Medicine

## 2014-07-19 DIAGNOSIS — Z3202 Encounter for pregnancy test, result negative: Secondary | ICD-10-CM | POA: Diagnosis not present

## 2014-07-19 DIAGNOSIS — R45851 Suicidal ideations: Secondary | ICD-10-CM | POA: Diagnosis not present

## 2014-07-19 DIAGNOSIS — R519 Headache, unspecified: Secondary | ICD-10-CM

## 2014-07-19 DIAGNOSIS — Z79899 Other long term (current) drug therapy: Secondary | ICD-10-CM | POA: Insufficient documentation

## 2014-07-19 DIAGNOSIS — R51 Headache: Secondary | ICD-10-CM | POA: Insufficient documentation

## 2014-07-19 LAB — SALICYLATE LEVEL: Salicylate Lvl: <2 mg/dL — ABNORMAL LOW (ref 2.8–20.0)

## 2014-07-19 LAB — BASIC METABOLIC PANEL
Anion gap: 12 (ref 5–15)
BUN: 11 mg/dL (ref 6–23)
CALCIUM: 9.3 mg/dL (ref 8.4–10.5)
CHLORIDE: 103 meq/L (ref 96–112)
CO2: 22 meq/L (ref 19–32)
CREATININE: 0.79 mg/dL (ref 0.50–1.10)
GFR calc Af Amer: >90 mL/min (ref >90)
GFR calc non Af Amer: >90 mL/min (ref >90)
GLUCOSE: 96 mg/dL (ref 70–99)
Potassium: 4.5 mEq/L (ref 3.7–5.3)
SODIUM: 137 meq/L (ref 137–147)

## 2014-07-19 LAB — ACETAMINOPHEN LEVEL: Acetaminophen (Tylenol), Serum: <15 ug/mL (ref 10–30)

## 2014-07-19 LAB — ETHANOL

## 2014-07-19 MED ORDER — IBUPROFEN 400 MG PO TABS
600.0000 mg | ORAL_TABLET | Freq: Once | ORAL | Status: AC
  Administered 2014-07-19: 600 mg via ORAL
  Filled 2014-07-19 (×2): qty 1

## 2014-07-19 NOTE — ED Provider Notes (Signed)
CSN: [id number]     Arrival date & time 07/19/14  1918 History   First MD Initiated Contact with Patient 07/19/14 2128     Chief Complaint  Patient presents with  . Headache     (Consider location/radiation/quality/duration/timing/severity/associated sxs/prior Treatment) HPI ASANTI CRAIGO is a 23 y.o. female who presents to ED with complaint of "blacking out" episodes. Patient initially was sent to [facility], for suicidal thoughts and these blackouts. Patient is currently IVC. Patient states she has had these episodes for the last 17 years. States that she forgets what she is doing. She gives example of "walking down the street and suddenly forget what I'm doing, however I sent her member or someone has to tell me." She states she never falls down to the ground during this episodes. States never stops walking. She reports her mother has witnessed these episodes and reports him to be brief. She denies any prior evaluation of the same. Patient also reports intermittent headaches that she's had for years. Worsen in the last 2 days. She states she normally takes aspirin with some relief. No medications taken today. States at this time headache is mild. Reports associated photophobia. No changes in vision. No nausea, vomiting. No fevers.  History reviewed. No pertinent past medical history. History reviewed. No pertinent past surgical history. No family history on file. History  Substance Use Topics  . Smoking status: Never Smoker   . Smokeless tobacco: Not on file  . Alcohol Use: No   OB History    No data available     Review of Systems  Constitutional: Negative for fever and chills.  Respiratory: Negative for cough, chest tightness and shortness of breath.   Cardiovascular: Negative for chest pain, palpitations and leg swelling.  Gastrointestinal: Negative for nausea, vomiting, abdominal pain and diarrhea.  Genitourinary: Negative for dysuria and flank pain.  Musculoskeletal:  Negative for myalgias, arthralgias, neck pain and neck stiffness.  Skin: Negative for rash.  Neurological: Positive for headaches. Negative for dizziness and weakness.  All other systems reviewed and are negative.     Allergies  Review of patient's allergies indicates no known allergies.  Home Medications   Prior to Admission medications   Medication Sig Start Date End Date Taking? Authorizing Provider  ARIPiprazole (ABILIFY) 30 MG tablet Take 10 mg by mouth daily.    Yes Historical Provider, MD  hydrOXYzine (ATARAX/VISTARIL) 25 MG tablet Take 25 mg by mouth at bedtime.   Yes Historical Provider, MD  sertraline (ZOLOFT) 50 MG tablet Take 100 mg by mouth daily.    Yes Historical Provider, MD  amoxicillin-clavulanate (AUGMENTIN) 875-125 MG per tablet Take 1 tablet by mouth 2 (two) times daily. Patient not taking: Reported on 07/19/2014 06/04/12   Roselee Culver, MD  sulfacetamide (BLEPH-10) 10 % ophthalmic solution Place 1 drop into the left eye every 3 (three) hours. Patient not taking: Reported on 07/19/2014 06/04/12   Roselee Culver, MD   BP 111/74 mmHg  Pulse 55  Temp(Src) 98 F (36.7 C) (Oral)  Resp 18  Ht 6' (1.829 m)  Wt 140 lb (63.504 kg)  BMI 18.98 kg/m2  SpO2 100% Physical Exam  Constitutional: She is oriented to person, place, and time. She appears well-developed and well-nourished. No distress.  HENT:  Head: Normocephalic.  Right Ear: External ear normal.  Left Ear: External ear normal.  Nose: Nose normal.  Mouth/Throat: Oropharynx is clear and moist.  Eyes: Conjunctivae and EOM are normal. Pupils are equal, round, and  reactive to light.  Neck: Normal range of motion. Neck supple.  Cardiovascular: Normal rate, regular rhythm and normal heart sounds.   Pulmonary/Chest: Effort normal and breath sounds normal. No respiratory distress. She has no wheezes. She has no rales.  Abdominal: Soft. Bowel sounds are normal. She exhibits no distension. There is no  tenderness. There is no rebound.  Musculoskeletal: She exhibits no edema.  Neurological: She is alert and oriented to person, place, and time. No cranial nerve deficit. Coordination normal.  Skin: Skin is warm and dry.  Psychiatric: She has a normal mood and affect. Her behavior is normal.  Nursing note and vitals reviewed.   ED Course  Procedures (including critical care time) Labs Review Labs Reviewed  CBC WITH DIFFERENTIAL - Abnormal; Notable for the following:    RBC 3.69 (*)    Hemoglobin 10.7 (*)    HCT 32.2 (*)    All other components within normal limits  SALICYLATE LEVEL - Abnormal; Notable for the following:    Salicylate Lvl <7.8 (*)    All other components within normal limits  URINE RAPID DRUG SCREEN (HOSP PERFORMED)  ETHANOL  ACETAMINOPHEN LEVEL  BASIC METABOLIC PANEL  I-STAT CHEM 8, ED  POC URINE PREG, ED    Imaging Review No results found.   EKG Interpretation None      MDM   Final diagnoses:  Nonintractable headache, unspecified chronicity pattern, unspecified headache type  Suicidal ideation    Patient sent here for EEG given headaches and these blackout episodes. Unable to do EKG in emergency department. I talked to Dr. Jeanell Sparrow who agrees. Recommended referring patient for outpatient follow-up. These symptoms have been going on for 14 years, and it very unlikely that these are seizures from her description. Will send back to [facility]  11:06 PM Spoke with [facility] Sessions, they do want full medical labs for clearance. We'll place lab work and urine drug screen in. The paper patient came in with only asked for EEG, I verified with [facility] nurse, she did say that she wants full medical clearance.  12:35 AM Pt medically cleared. Will need follow up with neurology on outpatient basis. Given referral.   Filed Vitals:   07/19/14 2300 07/19/14 2330 07/19/14 2345 07/20/14 0000  BP: 108/68 116/63 109/71 103/63  Pulse: 50 52 49 55  Temp:      TempSrc:      Resp:       Height:      Weight:      SpO2: 100% 100% 100% 99%     Renold Genta, PA-C 07/20/14 0036  Shaune Pollack, MD 07/24/14 [phone]

## 2014-07-19 NOTE — ED Notes (Addendum)
Pt brought to ED from [facility] to have EEG for blacking out spells.  Pt is currently IVC'd and needs to be medically cleared.  Pt c/o headache onset this am, denies nausea or vomiting

## 2014-07-20 DIAGNOSIS — F70 Mild intellectual disabilities: Secondary | ICD-10-CM | POA: Diagnosis not present

## 2014-07-20 DIAGNOSIS — R45850 Homicidal ideations: Secondary | ICD-10-CM | POA: Diagnosis present

## 2014-07-20 DIAGNOSIS — D509 Iron deficiency anemia, unspecified: Secondary | ICD-10-CM | POA: Diagnosis present

## 2014-07-20 DIAGNOSIS — R4183 Borderline intellectual functioning: Secondary | ICD-10-CM | POA: Diagnosis not present

## 2014-07-20 DIAGNOSIS — F250 Schizoaffective disorder, bipolar type: Secondary | ICD-10-CM | POA: Diagnosis not present

## 2014-07-20 DIAGNOSIS — Z23 Encounter for immunization: Secondary | ICD-10-CM | POA: Diagnosis not present

## 2014-07-20 DIAGNOSIS — R45851 Suicidal ideations: Secondary | ICD-10-CM | POA: Diagnosis not present

## 2014-07-20 DIAGNOSIS — F332 Major depressive disorder, recurrent severe without psychotic features: Secondary | ICD-10-CM | POA: Diagnosis not present

## 2014-07-20 LAB — RAPID URINE DRUG SCREEN, HOSP PERFORMED
AMPHETAMINES: NOT DETECTED
BARBITURATES: NOT DETECTED
BENZODIAZEPINES: NOT DETECTED
Cocaine: NOT DETECTED
Opiates: NOT DETECTED
Tetrahydrocannabinol: NOT DETECTED

## 2014-07-20 LAB — CBC WITH DIFFERENTIAL/PLATELET
Basophils Absolute: 0 10*3/uL (ref 0.0–0.1)
Basophils Relative: 0 % (ref 0–1)
EOS PCT: 1 % (ref 0–5)
Eosinophils Absolute: 0.1 10*3/uL (ref 0.0–0.7)
HEMATOCRIT: 32.2 % — AB (ref 36.0–46.0)
HEMOGLOBIN: 10.7 g/dL — AB (ref 12.0–15.0)
LYMPHS ABS: 2.2 10*3/uL (ref 0.7–4.0)
LYMPHS PCT: 39 % (ref 12–46)
MCH: 29 pg (ref 26.0–34.0)
MCHC: 33.2 g/dL (ref 30.0–36.0)
MCV: 87.3 fL (ref 78.0–100.0)
MONO ABS: 0.3 10*3/uL (ref 0.1–1.0)
MONOS PCT: 6 % (ref 3–12)
Neutro Abs: 3 10*3/uL (ref 1.7–7.7)
Neutrophils Relative %: 54 % (ref 43–77)
Platelets: 166 10*3/uL (ref 150–400)
RBC: 3.69 MIL/uL — AB (ref 3.87–5.11)
RDW: 12.8 % (ref 11.5–15.5)
WBC: 5.6 10*3/uL (ref 4.0–10.5)
nRBC: 0 /100 WBC

## 2014-07-20 LAB — POC URINE PREG, ED: Preg Test, Ur: NEGATIVE

## 2014-07-20 NOTE — Discharge Instructions (Signed)
Labs unremarkable today. Please report back to [facility] for further evaluation. These episodes of confusion will need further evaluation on outpatient basis with neurology. '

## 2014-07-20 NOTE — ED Notes (Signed)
Pt dc per MD order, VSS, [facility] called for report and lab results faxed over, pt verbalize understanding of dc instructions. Officer at the bedside with her. Pt awaiting for a call back from [facility] to be transfer back.

## 2014-07-29 DIAGNOSIS — F322 Major depressive disorder, single episode, severe without psychotic features: Secondary | ICD-10-CM | POA: Diagnosis not present

## 2014-07-30 ENCOUNTER — Emergency Department (HOSPITAL_COMMUNITY)
Admission: EM | Admit: 2014-07-30 | Discharge: 2014-07-30 | Disposition: A | Discharge status: Home / Self Care | Payer: Medicare Other | Attending: Emergency Medicine | Admitting: Emergency Medicine

## 2014-07-30 ENCOUNTER — Encounter (HOSPITAL_COMMUNITY): Payer: Self-pay | Admitting: Emergency Medicine

## 2014-07-30 DIAGNOSIS — Y998 Other external cause status: Secondary | ICD-10-CM | POA: Insufficient documentation

## 2014-07-30 DIAGNOSIS — Z3202 Encounter for pregnancy test, result negative: Secondary | ICD-10-CM | POA: Insufficient documentation

## 2014-07-30 DIAGNOSIS — F329 Major depressive disorder, single episode, unspecified: Secondary | ICD-10-CM | POA: Insufficient documentation

## 2014-07-30 DIAGNOSIS — R112 Nausea with vomiting, unspecified: Secondary | ICD-10-CM | POA: Insufficient documentation

## 2014-07-30 DIAGNOSIS — Y9289 Other specified places as the place of occurrence of the external cause: Secondary | ICD-10-CM | POA: Diagnosis not present

## 2014-07-30 DIAGNOSIS — T50905A Adverse effect of unspecified drugs, medicaments and biological substances, initial encounter: Secondary | ICD-10-CM

## 2014-07-30 DIAGNOSIS — T421X5A Adverse effect of iminostilbenes, initial encounter: Secondary | ICD-10-CM | POA: Diagnosis not present

## 2014-07-30 DIAGNOSIS — Z79899 Other long term (current) drug therapy: Secondary | ICD-10-CM | POA: Diagnosis not present

## 2014-07-30 HISTORY — DX: Depression, unspecified: F32.A

## 2014-07-30 HISTORY — DX: Major depressive disorder, single episode, unspecified: F32.9

## 2014-07-30 LAB — URINALYSIS, ROUTINE W REFLEX MICROSCOPIC
BILIRUBIN URINE: NEGATIVE
Glucose, UA: NEGATIVE mg/dL
Ketones, ur: NEGATIVE mg/dL
Nitrite: NEGATIVE
Protein, ur: NEGATIVE mg/dL
Specific Gravity, Urine: <1.005 — ABNORMAL LOW (ref 1.005–1.030)
Urobilinogen, UA: 0.2 mg/dL (ref 0.0–1.0)
pH: 7 (ref 5.0–8.0)

## 2014-07-30 LAB — COMPREHENSIVE METABOLIC PANEL
ALK PHOS: 62 U/L (ref 39–117)
ALT: 12 U/L (ref 0–35)
AST: 16 U/L (ref 0–37)
Albumin: 4.2 g/dL (ref 3.5–5.2)
Anion gap: 13 (ref 5–15)
BILIRUBIN TOTAL: 0.4 mg/dL (ref 0.3–1.2)
BUN: 6 mg/dL (ref 6–23)
CHLORIDE: 101 meq/L (ref 96–112)
CO2: 23 mEq/L (ref 19–32)
CREATININE: 0.69 mg/dL (ref 0.50–1.10)
Calcium: 9.6 mg/dL (ref 8.4–10.5)
GFR calc Af Amer: >90 mL/min (ref >90)
GFR calc non Af Amer: >90 mL/min (ref >90)
Glucose, Bld: 91 mg/dL (ref 70–99)
POTASSIUM: 4.5 meq/L (ref 3.7–5.3)
SODIUM: 137 meq/L (ref 137–147)
Total Protein: 7.9 g/dL (ref 6.0–8.3)

## 2014-07-30 LAB — URINE MICROSCOPIC-ADD ON

## 2014-07-30 LAB — CBC WITH DIFFERENTIAL/PLATELET
BASOS PCT: 0 % (ref 0–1)
Basophils Absolute: 0 10*3/uL (ref 0.0–0.1)
Eosinophils Absolute: 0 10*3/uL (ref 0.0–0.7)
Eosinophils Relative: 1 % (ref 0–5)
HCT: 34.2 % — ABNORMAL LOW (ref 36.0–46.0)
Hemoglobin: 11.5 g/dL — ABNORMAL LOW (ref 12.0–15.0)
Lymphocytes Relative: 27 % (ref 12–46)
Lymphs Abs: 1 10*3/uL (ref 0.7–4.0)
MCH: 29.4 pg (ref 26.0–34.0)
MCHC: 33.6 g/dL (ref 30.0–36.0)
MCV: 87.5 fL (ref 78.0–100.0)
Monocytes Absolute: 0.3 10*3/uL (ref 0.1–1.0)
Monocytes Relative: 7 % (ref 3–12)
NEUTROS ABS: 2.5 10*3/uL (ref 1.7–7.7)
NEUTROS PCT: 65 % (ref 43–77)
PLATELETS: 132 10*3/uL — AB (ref 150–400)
RBC: 3.91 MIL/uL (ref 3.87–5.11)
RDW: 13.2 % (ref 11.5–15.5)
WBC: 3.9 10*3/uL — ABNORMAL LOW (ref 4.0–10.5)

## 2014-07-30 LAB — POC URINE PREG, ED: Preg Test, Ur: NEGATIVE

## 2014-07-30 LAB — LIPASE, BLOOD: LIPASE: 15 U/L (ref 11–59)

## 2014-07-30 LAB — CARBAMAZEPINE LEVEL, TOTAL: CARBAMAZEPINE LVL: 5.2 ug/mL (ref 4.0–12.0)

## 2014-07-30 MED ORDER — ONDANSETRON 4 MG PO TBDP
8.0000 mg | ORAL_TABLET | Freq: Once | ORAL | Status: AC
  Administered 2014-07-30: 8 mg via ORAL
  Filled 2014-07-30: qty 2

## 2014-07-30 MED ORDER — FENTANYL CITRATE 0.05 MG/ML IJ SOLN
50.0000 ug | Freq: Once | INTRAMUSCULAR | Status: AC
  Administered 2014-07-30: 50 ug via INTRAVENOUS
  Filled 2014-07-30: qty 2

## 2014-07-30 MED ORDER — SODIUM CHLORIDE 0.9 % IV BOLUS (SEPSIS)
1000.0000 mL | Freq: Once | INTRAVENOUS | Status: AC
  Administered 2014-07-30: 1000 mL via INTRAVENOUS

## 2014-07-30 MED ORDER — CARBAMAZEPINE ER 100 MG PO TB12: 100.0000 mg | ORAL_TABLET | Freq: Two times a day (BID) | ORAL | Status: DC

## 2014-07-30 MED ORDER — PROMETHAZINE HCL 25 MG/ML IJ SOLN
12.5000 mg | INTRAMUSCULAR | Status: DC | PRN
  Administered 2014-07-30: 12.5 mg via INTRAVENOUS
  Filled 2014-07-30: qty 1

## 2014-07-30 MED ORDER — PROMETHAZINE HCL 25 MG PO TABS: 25.0000 mg | ORAL_TABLET | Freq: Four times a day (QID) | ORAL | Status: DC | PRN

## 2014-07-30 NOTE — ED Provider Notes (Signed)
CSN: [id number]     Arrival date & time 07/30/14  1332 History   First MD Initiated Contact with Patient 07/30/14 1515     Chief Complaint  Patient presents with  . Emesis      HPI Patient been having nausea and vomiting in the last 4-48 hours.  She started 3 Medications on Friday last week.  She it's very nauseated after taking the carbamazepine.  That seems to be the medicine that triggers her nausea and vomiting. Past Medical History  Diagnosis Date  . Depression    History reviewed. No pertinent past surgical history. History reviewed. No pertinent family history. History  Substance Use Topics  . Smoking status: Never Smoker   . Smokeless tobacco: Not on file  . Alcohol Use: No   OB History    No data available     Review of Systems  All other systems reviewed and are negative  Allergies  Review of patient's allergies indicates no known allergies.  Home Medications   Prior to Admission medications   Medication Sig Start Date End Date Taking? Authorizing Provider  prazosin (MINIPRESS) 1 MG capsule Take 1 mg by mouth at bedtime.   Yes Historical Provider, MD  risperiDONE (RISPERDAL) 2 MG tablet Take 2 mg by mouth at bedtime. 07/24/14  Yes Historical Provider, MD  amoxicillin-clavulanate (AUGMENTIN) 875-125 MG per tablet Take 1 tablet by mouth 2 (two) times daily. Patient not taking: Reported on 07/19/2014 06/04/12   Roselee Culver, MD  ARIPiprazole (ABILIFY) 30 MG tablet Take 10 mg by mouth daily.     Historical Provider, MD  carbamazepine (TEGRETOL-XR) 100 MG 12 hr tablet Take 1 tablet (100 mg total) by mouth 2 (two) times daily. 07/30/14   Dot Lanes, MD  hydrOXYzine (ATARAX/VISTARIL) 25 MG tablet Take 25 mg by mouth at bedtime.    Historical Provider, MD  promethazine (PHENERGAN) 25 MG tablet Take 1 tablet (25 mg total) by mouth every 6 (six) hours as needed for nausea or vomiting. 07/30/14   Dot Lanes, MD  sertraline (ZOLOFT) 50 MG tablet Take 100  mg by mouth daily.     Historical Provider, MD  sulfacetamide (BLEPH-10) 10 % ophthalmic solution Place 1 drop into the left eye every 3 (three) hours. Patient not taking: Reported on 07/19/2014 06/04/12   Roselee Culver, MD   BP 128/72 mmHg  Pulse 81  Temp(Src) 98.4 F (36.9 C) (Oral)  Resp 18  SpO2 100% Physical Exam Physical Exam  Nursing note and vitals reviewed. Constitutional: She is oriented to person, place, and time. She appears well-developed and well-nourished. No distress.  HENT:  Head: Normocephalic and atraumatic.  Eyes: Pupils are equal, round, and reactive to light.  Neck: Normal range of motion.  Cardiovascular: Normal rate and intact distal pulses.   Pulmonary/Chest: No respiratory distress.  Abdominal: Normal appearance. She exhibits no distension.  Musculoskeletal: Normal range of motion.  Neurological: She is alert and oriented to person, place, and time. No cranial nerve deficit.  Skin: Skin is warm and dry. No rash noted.  Psychiatric: She has a normal mood and affect. Her behavior is normal.   ED Course  Procedures (including critical care time) Medications  promethazine (PHENERGAN) injection 12.5 mg (12.5 mg Intravenous Given 07/30/14 1553)  ondansetron (ZOFRAN-ODT) disintegrating tablet 8 mg (8 mg Oral Given 07/30/14 1452)  sodium chloride 0.9 % bolus 1,000 mL (1,000 mLs Intravenous New Bag/Given 07/30/14 1553)  fentaNYL (SUBLIMAZE) injection 50 mcg (50 mcg  Intravenous Given 07/30/14 1553)   After treatment in the ED the patient feels back to baseline and wants to go home. Labs Review Labs Reviewed  CBC WITH DIFFERENTIAL - Abnormal; Notable for the following:    WBC 3.9 (*)    Hemoglobin 11.5 (*)    HCT 34.2 (*)    Platelets 132 (*)    All other components within normal limits  URINALYSIS, ROUTINE W REFLEX MICROSCOPIC - Abnormal; Notable for the following:    Color, Urine STRAW (*)    APPearance HAZY (*)    Specific Gravity, Urine <1.005 (*)     Hgb urine dipstick SMALL (*)    Leukocytes, UA SMALL (*)    All other components within normal limits  URINE MICROSCOPIC-ADD ON - Abnormal; Notable for the following:    Squamous Epithelial / LPF MANY (*)    Bacteria, UA FEW (*)    All other components within normal limits  COMPREHENSIVE METABOLIC PANEL  LIPASE, BLOOD  CARBAMAZEPINE LEVEL, TOTAL  POC URINE PREG, ED         MDM   Final diagnoses:  Adverse effects of medication, initial encounter        Dot Lanes, MD 07/30/14 [phone]

## 2014-07-30 NOTE — ED Notes (Signed)
Pt c/o N/V since stating new psych meds earlier this week

## 2014-07-30 NOTE — Discharge Instructions (Signed)
Stop the carbamazepine 200 mg tablets and switch to 100 mg tablets but take twice a day   Nausea and Vomiting Nausea means you feel sick to your stomach. Throwing up (vomiting) is a reflex where stomach contents come out of your mouth. HOME CARE   Take medicine as told by your doctor.  Do not force yourself to eat. However, you do need to drink fluids.  If you feel like eating, eat a normal diet as told by your doctor.  Eat rice, wheat, potatoes, bread, lean meats, yogurt, fruits, and vegetables.  Avoid high-fat foods.  Drink enough fluids to keep your pee (urine) clear or pale yellow.  Ask your doctor how to replace body fluid losses (rehydrate). Signs of body fluid loss (dehydration) include:  Feeling very thirsty.  Dry lips and mouth.  Feeling dizzy.  Dark pee.  Peeing less than normal.  Feeling confused.  Fast breathing or heart rate. GET HELP RIGHT AWAY IF:   You have blood in your throw up.  You have black or bloody poop (stool).  You have a bad headache or stiff neck.  You feel confused.  You have bad belly (abdominal) pain.  You have chest pain or trouble breathing.  You do not pee at least once every 8 hours.  You have cold, clammy skin.  You keep throwing up after 24 to 48 hours.  You have a fever. MAKE SURE YOU:   Understand these instructions.  Will watch your condition.  Will get help right away if you are not doing well or get worse. Document Released: 01/23/2008 Document Revised: 10/29/2011 Document Reviewed: 01/05/2011 [facility] Patient Information 2015 [place], [place]. This information is not intended to replace advice given to you by your health care provider. Make sure you discuss any questions you have with your health care provider.

## 2014-08-02 DIAGNOSIS — R109 Unspecified abdominal pain: Secondary | ICD-10-CM | POA: Diagnosis not present

## 2014-08-02 DIAGNOSIS — Z3009 Encounter for other general counseling and advice on contraception: Secondary | ICD-10-CM | POA: Diagnosis not present

## 2014-08-06 DIAGNOSIS — R1030 Lower abdominal pain, unspecified: Secondary | ICD-10-CM | POA: Diagnosis not present

## 2014-08-09 DIAGNOSIS — R1030 Lower abdominal pain, unspecified: Secondary | ICD-10-CM | POA: Diagnosis not present

## 2014-08-26 DIAGNOSIS — F3132 Bipolar disorder, current episode depressed, moderate: Secondary | ICD-10-CM | POA: Diagnosis not present

## 2014-08-26 DIAGNOSIS — F339 Major depressive disorder, recurrent, unspecified: Secondary | ICD-10-CM | POA: Diagnosis not present

## 2014-08-30 DIAGNOSIS — F3132 Bipolar disorder, current episode depressed, moderate: Secondary | ICD-10-CM | POA: Diagnosis not present

## 2014-08-30 DIAGNOSIS — F339 Major depressive disorder, recurrent, unspecified: Secondary | ICD-10-CM | POA: Diagnosis not present

## 2014-08-31 DIAGNOSIS — F3132 Bipolar disorder, current episode depressed, moderate: Secondary | ICD-10-CM | POA: Diagnosis not present

## 2014-08-31 DIAGNOSIS — F339 Major depressive disorder, recurrent, unspecified: Secondary | ICD-10-CM | POA: Diagnosis not present

## 2014-09-02 DIAGNOSIS — F3132 Bipolar disorder, current episode depressed, moderate: Secondary | ICD-10-CM | POA: Diagnosis not present

## 2014-09-02 DIAGNOSIS — F339 Major depressive disorder, recurrent, unspecified: Secondary | ICD-10-CM | POA: Diagnosis not present

## 2014-09-09 ENCOUNTER — Encounter (HOSPITAL_COMMUNITY): Payer: Self-pay | Admitting: Emergency Medicine

## 2014-09-09 ENCOUNTER — Emergency Department (HOSPITAL_COMMUNITY)
Admission: EM | Admit: 2014-09-09 | Discharge: 2014-09-10 | Disposition: A | Discharge status: Other Acute Inpatient Hospital | Payer: Medicare Other | Attending: Emergency Medicine | Admitting: Emergency Medicine

## 2014-09-09 DIAGNOSIS — R630 Anorexia: Secondary | ICD-10-CM | POA: Insufficient documentation

## 2014-09-09 DIAGNOSIS — R4689 Other symptoms and signs involving appearance and behavior: Secondary | ICD-10-CM

## 2014-09-09 DIAGNOSIS — R45851 Suicidal ideations: Secondary | ICD-10-CM | POA: Insufficient documentation

## 2014-09-09 DIAGNOSIS — Z3202 Encounter for pregnancy test, result negative: Secondary | ICD-10-CM | POA: Insufficient documentation

## 2014-09-09 DIAGNOSIS — R45850 Homicidal ideations: Secondary | ICD-10-CM | POA: Diagnosis not present

## 2014-09-09 DIAGNOSIS — G4700 Insomnia, unspecified: Secondary | ICD-10-CM | POA: Diagnosis not present

## 2014-09-09 DIAGNOSIS — Z046 Encounter for general psychiatric examination, requested by authority: Secondary | ICD-10-CM | POA: Diagnosis present

## 2014-09-09 DIAGNOSIS — Z792 Long term (current) use of antibiotics: Secondary | ICD-10-CM | POA: Insufficient documentation

## 2014-09-09 DIAGNOSIS — Z79899 Other long term (current) drug therapy: Secondary | ICD-10-CM | POA: Diagnosis not present

## 2014-09-09 DIAGNOSIS — R4589 Other symptoms and signs involving emotional state: Secondary | ICD-10-CM

## 2014-09-09 HISTORY — DX: Bipolar disorder, unspecified: F31.9

## 2014-09-09 LAB — COMPREHENSIVE METABOLIC PANEL
ALBUMIN: 4.8 g/dL (ref 3.5–5.2)
ALT: 11 U/L (ref 0–35)
AST: 16 U/L (ref 0–37)
Alkaline Phosphatase: 59 U/L (ref 39–117)
Anion gap: 8 (ref 5–15)
BILIRUBIN TOTAL: 0.6 mg/dL (ref 0.3–1.2)
BUN: 12 mg/dL (ref 6–23)
CO2: 25 mmol/L (ref 19–32)
CREATININE: 0.92 mg/dL (ref 0.50–1.10)
Calcium: 9.5 mg/dL (ref 8.4–10.5)
Chloride: 103 mEq/L (ref 96–112)
GFR calc Af Amer: >90 mL/min (ref >90)
GFR, EST NON AFRICAN AMERICAN: 86 mL/min — AB (ref >90)
Glucose, Bld: 99 mg/dL (ref 70–99)
Potassium: 4 mmol/L (ref 3.5–5.1)
Sodium: 136 mmol/L (ref 135–145)
Total Protein: 8.3 g/dL (ref 6.0–8.3)

## 2014-09-09 LAB — URINE MICROSCOPIC-ADD ON

## 2014-09-09 LAB — URINALYSIS, ROUTINE W REFLEX MICROSCOPIC
GLUCOSE, UA: NEGATIVE mg/dL
Ketones, ur: NEGATIVE mg/dL
Leukocytes, UA: NEGATIVE
NITRITE: NEGATIVE
PH: 6 (ref 5.0–8.0)
Protein, ur: 30 mg/dL — AB
SPECIFIC GRAVITY, URINE: 1.025 (ref 1.005–1.030)
UROBILINOGEN UA: 1 mg/dL (ref 0.0–1.0)

## 2014-09-09 LAB — ETHANOL

## 2014-09-09 LAB — CBC WITH DIFFERENTIAL/PLATELET
Basophils Absolute: 0 10*3/uL (ref 0.0–0.1)
Basophils Relative: 0 % (ref 0–1)
Eosinophils Absolute: 0.1 10*3/uL (ref 0.0–0.7)
Eosinophils Relative: 1 % (ref 0–5)
HCT: 34.1 % — ABNORMAL LOW (ref 36.0–46.0)
Hemoglobin: 11.6 g/dL — ABNORMAL LOW (ref 12.0–15.0)
LYMPHS ABS: 1.9 10*3/uL (ref 0.7–4.0)
Lymphocytes Relative: 28 % (ref 12–46)
MCH: 30.1 pg (ref 26.0–34.0)
MCHC: 34 g/dL (ref 30.0–36.0)
MCV: 88.6 fL (ref 78.0–100.0)
Monocytes Absolute: 0.5 10*3/uL (ref 0.1–1.0)
Monocytes Relative: 7 % (ref 3–12)
Neutro Abs: 4.4 10*3/uL (ref 1.7–7.7)
Neutrophils Relative %: 64 % (ref 43–77)
PLATELETS: 172 10*3/uL (ref 150–400)
RBC: 3.85 MIL/uL — ABNORMAL LOW (ref 3.87–5.11)
RDW: 12.7 % (ref 11.5–15.5)
WBC: 6.9 10*3/uL (ref 4.0–10.5)

## 2014-09-09 LAB — POC URINE PREG, ED: Preg Test, Ur: NEGATIVE

## 2014-09-09 LAB — RAPID URINE DRUG SCREEN, HOSP PERFORMED
AMPHETAMINES: NOT DETECTED
BARBITURATES: NOT DETECTED
Benzodiazepines: NOT DETECTED
Cocaine: NOT DETECTED
Opiates: NOT DETECTED
TETRAHYDROCANNABINOL: NOT DETECTED

## 2014-09-09 NOTE — ED Notes (Signed)
Belongings have been properly documented.  Pt has been seen and wanded by security.

## 2014-09-09 NOTE — ED Provider Notes (Signed)
CSN: [id number]     Arrival date & time 09/09/14  1804 History   First MD Initiated Contact with Patient 09/09/14 2020     Chief Complaint  Patient presents with  . IVC      (Consider location/radiation/quality/duration/timing/severity/associated sxs/prior Treatment) Patient is a 24 y.o. female presenting with mental health disorder. The history is provided by the patient.  Mental Health Problem Presenting symptoms: depression, homicidal ideas and suicidal thoughts   Patient accompanied by:  Law enforcement Degree of incapacity (severity):  Severe Onset quality:  Gradual Duration:  2 months Timing:  Constant Progression:  Worsening Chronicity:  Recurrent Context: noncompliance and stressful life event   Context: not alcohol use and not drug abuse   Context comment:  Not getting along with roommate and things not working out Treatment compliance:  Untreated Time since last psychoactive medication taken:  1 month Relieved by:  None tried Worsened by:  Lack of sleep Ineffective treatments:  None tried Associated symptoms: anhedonia, appetite change, feelings of worthlessness, insomnia and poor judgment   Risk factors: hx of mental illness and hx of suicide attempts     Past Medical History  Diagnosis Date  . Depression   . Bipolar 1 disorder    History reviewed. No pertinent past surgical history. No family history on file. History  Substance Use Topics  . Smoking status: Never Smoker   . Smokeless tobacco: Not on file  . Alcohol Use: No   OB History    No data available     Review of Systems  Constitutional: Positive for appetite change.  Psychiatric/Behavioral: Positive for suicidal ideas and homicidal ideas. The patient has insomnia.   All other systems reviewed and are negative.     Allergies  Review of patient's allergies indicates no known allergies.  Home Medications   Prior to Admission medications   Medication Sig Start Date End Date Taking?  Authorizing Provider  amoxicillin-clavulanate (AUGMENTIN) 875-125 MG per tablet Take 1 tablet by mouth 2 (two) times daily. Patient not taking: Reported on 07/19/2014 06/04/12   Roselee Culver, MD  carbamazepine (TEGRETOL-XR) 100 MG 12 hr tablet Take 1 tablet (100 mg total) by mouth 2 (two) times daily. 07/30/14   Dot Lanes, MD  prazosin (MINIPRESS) 1 MG capsule Take 1 mg by mouth at bedtime.    Historical Provider, MD  promethazine (PHENERGAN) 25 MG tablet Take 1 tablet (25 mg total) by mouth every 6 (six) hours as needed for nausea or vomiting. 07/30/14   Dot Lanes, MD  risperiDONE (RISPERDAL) 2 MG tablet Take 2 mg by mouth at bedtime. 07/24/14   Historical Provider, MD  sertraline (ZOLOFT) 50 MG tablet Take 100 mg by mouth daily.     Historical Provider, MD  sulfacetamide (BLEPH-10) 10 % ophthalmic solution Place 1 drop into the left eye every 3 (three) hours. Patient not taking: Reported on 07/19/2014 06/04/12   Roselee Culver, MD   BP 123/67 mmHg  Pulse 76  Temp(Src) 98.1 F (36.7 C) (Oral)  Resp 18  SpO2 100% Physical Exam  Constitutional: She is oriented to person, place, and time. She appears well-developed and well-nourished. No distress.  HENT:  Head: Normocephalic and atraumatic.  Mouth/Throat: Oropharynx is clear and moist.  Eyes: Conjunctivae and EOM are normal. Pupils are equal, round, and reactive to light.  Neck: Normal range of motion. Neck supple.  Cardiovascular: Normal rate, regular rhythm and intact distal pulses.   No murmur heard. Pulmonary/Chest: Effort normal  and breath sounds normal. No respiratory distress. She has no wheezes. She has no rales.  Abdominal: Soft. She exhibits no distension. There is no tenderness. There is no rebound and no guarding.  Musculoskeletal: Normal range of motion. She exhibits no edema or tenderness.  Neurological: She is alert and oriented to person, place, and time.  Facial tics consistent with tardive dyskinesia   Skin: Skin is warm and dry. No rash noted. No erythema.  Psychiatric: Her speech is normal. She is actively hallucinating. She exhibits a depressed mood. She expresses homicidal and suicidal ideation. She expresses suicidal plans.  States that she is hearing things but it just sounds like paper cracking  Nursing note and vitals reviewed.   ED Course  Procedures (including critical care time) Labs Review Labs Reviewed  CBC WITH DIFFERENTIAL - Abnormal; Notable for the following:    RBC 3.85 (*)    Hemoglobin 11.6 (*)    HCT 34.1 (*)    All other components within normal limits  COMPREHENSIVE METABOLIC PANEL - Abnormal; Notable for the following:    GFR calc non Af Amer 86 (*)    All other components within normal limits  URINALYSIS, ROUTINE W REFLEX MICROSCOPIC - Abnormal; Notable for the following:    Color, Urine AMBER (*)    APPearance CLOUDY (*)    Hgb urine dipstick LARGE (*)    Bilirubin Urine SMALL (*)    Protein, ur 30 (*)    All other components within normal limits  URINE MICROSCOPIC-ADD ON - Abnormal; Notable for the following:    Casts HYALINE CASTS (*)    All other components within normal limits  URINE RAPID DRUG SCREEN (HOSP PERFORMED)  ETHANOL  POC URINE PREG, ED    Imaging Review No results found.   EKG Interpretation None      MDM   Final diagnoses:  Suicidal behavior  Homicidal behavior    Patient brought in by [employer] with IVC papers. She reports SI and HI that's worsened over the last several months. She thinks it's because she's not taking her medication and she is not getting along with her roommate and the situation is not working out. She states she wants to hang herself. She saw her psychiatrist today who sent her to behavioral health who sent her over here for further evaluation.  Patient is disheveled and depressed on exam.  Labs are within normal limits and patient is medically cleared. TTS to see and evaluate    Blanchie Dessert,  MD 09/09/14 2038

## 2014-09-09 NOTE — ED Notes (Addendum)
Pt under IVC presents with complaint of feeling HI towards others, plan to use her fist.  Denies SI.  Feeling hopeless, denies street drugs or alcohol use.  Pt reports diagnosed with Bipolar DO, was admitted to [facility] last month. Reports off meds x 1-2 mos.  Admits to hearing voices, states the voices are jumbled and visual hallucinations seeing one particular person.  Pt calm & cooperative at present, no distress noted, will monitor for safety.

## 2014-09-09 NOTE — ED Notes (Signed)
Per pt, took IVC papers out on herself-states she is sad because she has court dates -stopped taking meds because it made her sick-states she is suicidal and wants to hurt others

## 2014-09-09 NOTE — ED Notes (Signed)
Pt brought in by [employer] with IVC papers.  Papers reports pt has hx of Bipolar DO.  Was committed  In [facility] in November 2015. Reports pt has thoughts of hanging herself and her roommate.  Reports she has thoughts of just wanting to hit people she is around.  Pt is calm and cooperative at this time.

## 2014-09-10 ENCOUNTER — Encounter (HOSPITAL_COMMUNITY): Payer: Self-pay | Admitting: Psychiatry

## 2014-09-10 ENCOUNTER — Inpatient Hospital Stay (HOSPITAL_COMMUNITY)
Admission: AD | Admit: 2014-09-10 | Discharge: 2014-09-14 | DRG: 885 | Disposition: A | Discharge status: Home / Self Care | Payer: Medicare Other | Source: Intra-hospital | Attending: Psychiatry | Admitting: Psychiatry

## 2014-09-10 DIAGNOSIS — F3162 Bipolar disorder, current episode mixed, moderate: Secondary | ICD-10-CM | POA: Diagnosis not present

## 2014-09-10 DIAGNOSIS — R45851 Suicidal ideations: Secondary | ICD-10-CM | POA: Diagnosis present

## 2014-09-10 DIAGNOSIS — R45850 Homicidal ideations: Secondary | ICD-10-CM

## 2014-09-10 DIAGNOSIS — F79 Unspecified intellectual disabilities: Secondary | ICD-10-CM | POA: Diagnosis not present

## 2014-09-10 DIAGNOSIS — F6381 Intermittent explosive disorder: Secondary | ICD-10-CM | POA: Diagnosis not present

## 2014-09-10 DIAGNOSIS — F250 Schizoaffective disorder, bipolar type: Secondary | ICD-10-CM | POA: Diagnosis not present

## 2014-09-10 DIAGNOSIS — Z9114 Patient's other noncompliance with medication regimen: Secondary | ICD-10-CM | POA: Diagnosis present

## 2014-09-10 DIAGNOSIS — Z792 Long term (current) use of antibiotics: Secondary | ICD-10-CM | POA: Diagnosis not present

## 2014-09-10 DIAGNOSIS — F3163 Bipolar disorder, current episode mixed, severe, without psychotic features: Secondary | ICD-10-CM | POA: Diagnosis not present

## 2014-09-10 DIAGNOSIS — F419 Anxiety disorder, unspecified: Secondary | ICD-10-CM | POA: Diagnosis present

## 2014-09-10 DIAGNOSIS — F3164 Bipolar disorder, current episode mixed, severe, with psychotic features: Secondary | ICD-10-CM | POA: Diagnosis not present

## 2014-09-10 DIAGNOSIS — F70 Mild intellectual disabilities: Secondary | ICD-10-CM | POA: Diagnosis present

## 2014-09-10 DIAGNOSIS — G4700 Insomnia, unspecified: Secondary | ICD-10-CM | POA: Diagnosis not present

## 2014-09-10 DIAGNOSIS — R630 Anorexia: Secondary | ICD-10-CM | POA: Diagnosis not present

## 2014-09-10 DIAGNOSIS — Z3202 Encounter for pregnancy test, result negative: Secondary | ICD-10-CM | POA: Diagnosis not present

## 2014-09-10 MED ORDER — LAMOTRIGINE 25 MG PO TABS
25.0000 mg | ORAL_TABLET | Freq: Every day | ORAL | Status: DC
Start: 2014-09-10 — End: 2014-09-14
  Administered 2014-09-11 – 2014-09-14 (×4): 25 mg via ORAL
  Filled 2014-09-10 (×4): qty 1
  Filled 2014-09-10: qty 4
  Filled 2014-09-10 (×2): qty 1

## 2014-09-10 MED ORDER — LORAZEPAM 0.5 MG PO TABS
ORAL_TABLET | ORAL | Status: AC
  Administered 2014-09-10: 13:00:00
  Filled 2014-09-10: qty 1

## 2014-09-10 MED ORDER — BENZTROPINE MESYLATE 1 MG PO TABS: 1.0000 mg | ORAL_TABLET | Freq: Four times a day (QID) | ORAL | Status: DC | PRN

## 2014-09-10 MED ORDER — HALOPERIDOL 5 MG PO TABS: 5.0000 mg | ORAL_TABLET | Freq: Four times a day (QID) | ORAL | Status: DC | PRN

## 2014-09-10 MED ORDER — OLANZAPINE 5 MG PO TBDP
5.0000 mg | ORAL_TABLET | Freq: Every day | ORAL | Status: DC
  Administered 2014-09-10 – 2014-09-11 (×2): 5 mg via ORAL
  Filled 2014-09-10 (×5): qty 1

## 2014-09-10 MED ORDER — LORAZEPAM 0.5 MG PO TABS
0.5000 mg | ORAL_TABLET | Freq: Four times a day (QID) | ORAL | Status: DC | PRN
  Administered 2014-09-10: 0.5 mg via ORAL

## 2014-09-10 MED ORDER — TRAZODONE HCL 50 MG PO TABS
50.0000 mg | ORAL_TABLET | Freq: Every evening | ORAL | Status: AC | PRN
  Administered 2014-09-11: 50 mg via ORAL
  Filled 2014-09-10: qty 1

## 2014-09-10 MED ORDER — HYDROXYZINE HCL 25 MG PO TABS
25.0000 mg | ORAL_TABLET | ORAL | Status: DC | PRN
  Administered 2014-09-12: 25 mg via ORAL
  Filled 2014-09-10: qty 1

## 2014-09-10 MED ORDER — RISPERIDONE 2 MG PO TBDP: 2.0000 mg | ORAL_TABLET | Freq: Three times a day (TID) | ORAL | Status: DC | PRN

## 2014-09-10 MED ORDER — DIVALPROEX SODIUM 250 MG PO DR TAB: 250.0000 mg | DELAYED_RELEASE_TABLET | Freq: Two times a day (BID) | ORAL | Status: DC

## 2014-09-10 MED ORDER — BENZTROPINE MESYLATE 0.5 MG PO TABS
0.5000 mg | ORAL_TABLET | Freq: Every day | ORAL | Status: DC
  Administered 2014-09-10 – 2014-09-13 (×4): 0.5 mg via ORAL
  Filled 2014-09-10: qty 4
  Filled 2014-09-10 (×5): qty 1

## 2014-09-10 MED ORDER — ZIPRASIDONE MESYLATE 20 MG IM SOLR: 20.0000 mg | INTRAMUSCULAR | Status: DC | PRN

## 2014-09-10 MED ORDER — MAGNESIUM HYDROXIDE 400 MG/5ML PO SUSP: 30.0000 mL | Freq: Every day | ORAL | Status: DC | PRN

## 2014-09-10 MED ORDER — ALUM & MAG HYDROXIDE-SIMETH 200-200-20 MG/5ML PO SUSP: 30.0000 mL | ORAL | Status: DC | PRN

## 2014-09-10 MED ORDER — RISPERIDONE 1 MG PO TABS
1.0000 mg | ORAL_TABLET | Freq: Every day | ORAL | Status: DC
  Filled 2014-09-10: qty 1

## 2014-09-10 MED ORDER — ACETAMINOPHEN 325 MG PO TABS
650.0000 mg | ORAL_TABLET | Freq: Four times a day (QID) | ORAL | Status: DC | PRN
  Administered 2014-09-10 – 2014-09-14 (×4): 650 mg via ORAL
  Filled 2014-09-10 (×5): qty 2

## 2014-09-10 MED ORDER — LORAZEPAM 1 MG PO TABS: 1.0000 mg | ORAL_TABLET | ORAL | Status: DC | PRN

## 2014-09-10 NOTE — Tx Team (Signed)
  Interdisciplinary Treatment Plan Update   Date Reviewed:  09/10/2014  Time Reviewed:  11:32 AM  Progress in Treatment:   Attending groups: Yes Participating in groups: Appears uncomfortable in the group setting Taking medication as prescribed: Yes  Tolerating medication: Yes Family/Significant other contact made: Yes  Patient understands diagnosis: Yes AEB asking for help with depression, voices Discussing patient identified problems/goals with staff: Yes  See initial care plan Medical problems stabilized or resolved: Yes Denies suicidal/homicidal ideation: Yes  In tx team Patient has not harmed self or others: Yes  For review of initial/current patient goals, please see plan of care.  Estimated Length of Stay:  4-5 days  Reason for Continuation of Hospitalization: Depression Hallucinations Medication stabilization  New Problems/Goals identified:  N/A  Discharge Plan or Barriers:   return home, follow up outpt  Additional Comments:  This patient was brought in on IVC. She has HI towards someone and is also hearing voices. Patient is very quiet and does not give much detail to questions. She does endorse SI but has no plan. She has no previous attempts. Patient has HI towards a former roommate but has no plan or intention at this time. She has been hearing muffled, unintelligible voices and also sees someone but she does not know who it is. Patient lives with her family. She had moved out recently to move in with this roommate but it did not work out so she had to move back with family. Patient is non compliant with medications because she said that they make her sick. She has not taken any since December. Patient says she goes to [facility] and has an appt next week.   Zyprexa trail  Attendees:  Signature: Steva Colder, MD 09/10/2014 11:32 AM   Signature: Ripley Fraise, LCSW 09/10/2014 11:32 AM  Signature:  09/10/2014 11:32 AM  Signature: Marcello Moores, RN 09/10/2014 11:32 AM   Signature:  09/10/2014 11:32 AM  Signature:  09/10/2014 11:32 AM  Signature:   09/10/2014 11:32 AM  Signature:    Signature:    Signature:    Signature:    Signature:    Signature:      Scribe for Treatment Team:   Ripley Fraise, LCSW  09/10/2014 11:32 AM

## 2014-09-10 NOTE — H&P (Addendum)
Psychiatric Admission Assessment Adult  Patient Identification: Cindy Phillips MRN:  [id number] Date of Evaluation:  09/10/2014 Chief Complaint:Patient states "I am hearing voices .'    Principal Diagnosis: Bipolar 1 disorder, mixed, severe Diagnosis:   Primary Psychiatric Diagnosis: Bipolar disorder type I mixed ,severe with psychosis  Secondary Psychiatric Diagnosis: Intermittent explosive disorder Intellectual disability ,mild    Non Psychiatric Diagnosis: See pmh    Patient Active Problem List   Diagnosis Date Noted  . Bipolar 1 disorder, mixed, severe [F31.63] 09/10/2014  . Intellectual disability [F79] 09/10/2014     History of Present Illness::Cindy Phillips is a 24 y.o. AA female was brought in on IVC. Patient presented with  HI towards someone and  also hearing voices.Patient also has Intellectual disability , is a poor historian. She does endorse SI but has no plan. She has no previous attempts. Patient has HI towards a former roommate but has no plan or intention at this time. She has been hearing muffled, unintelligible voices . Patient also reports VH of a person, but does not know who it is.  Patient with previous hx of Bipolar disorder , reports medications as not working.Patient was tried on Risperdal which made her feel sick . She was also on Abilify in the past.  Patient does report some mood lability ,irritability at times. Reports sleep and appetite as fair.  Per initial evaluation patient lives with her family. She had moved out recently to move in with this roommate but it did not work out so she had to move back with family.   Patient used to follow up with [facility] in the past.  Patient reports several hospitalizations in the past -[place] ,[place] as well as [place].Patient was recently at [facility] a month ago.   Elements:  Location:  mood sx,psychosis. Quality:  irritability , anger issues ,AH of voices as well as VH  of seeing a person. Severity:  severe. Timing:  constant. Duration:  few days, worsening. Context:  hx of bipolar disorder,ID. Associated Signs/Symptoms: Depression Symptoms:  depressed mood, psychomotor agitation, feelings of worthlessness/guilt, difficulty concentrating, suicidal thoughts without plan, anxiety, disturbed sleep, (Hypo) Manic Symptoms:  Delusions, Distractibility, Elevated Mood, Impulsivity, Irritable Mood, Labiality of Mood, Anxiety Symptoms:  Excessive Worry, Psychotic Symptoms:  Delusions, Hallucinations: Auditory Visual Paranoia, PTSD Symptoms: Negative Total Time spent with patient: 1 hour  Past Medical History:  Past Medical History  Diagnosis Date  . Depression   . Bipolar 1 disorder    History reviewed. No pertinent past surgical history. Family History: History reviewed. No pertinent family history. Social History:  History  Alcohol Use No     History  Drug Use No    History   Social History  . Marital Status: Single    Spouse Name: N/A    Number of Children: N/A  . Years of Education: N/A   Social History Main Topics  . Smoking status: Never Smoker   . Smokeless tobacco: None  . Alcohol Use: No  . Drug Use: No  . Sexual Activity: None   Other Topics Concern  . None   Social History Narrative   Additional Social History:Patient lives with her mother. Patient is her own guardian. Patient reports a hx of ID ,likely mild. Patient was in special classes, graduated HS. Patient denies hx of sexual or physical abuse. Patient does report a hx of several legal charges in the past. Patient is on SSD.Patient is single ,has no children. Patient is religious.  Musculoskeletal: Strength & Muscle Tone: within normal limits Gait & Station: normal Patient leans: N/A  Psychiatric Specialty Exam: Physical Exam  Constitutional: She is oriented to person, place, and time. She appears well-nourished.   HENT:  Head: Normocephalic and atraumatic.  Eyes: Conjunctivae are normal. Pupils are equal, round, and reactive to light.  Neck: Normal range of motion.  Cardiovascular: Normal rate.   Respiratory: Effort normal.  GI: Soft.  Musculoskeletal: Normal range of motion.  Neurological: She is alert and oriented to person, place, and time.  Skin: Skin is warm.  Psychiatric: Her mood appears anxious. Her speech is delayed. She is withdrawn. Thought content is paranoid. Cognition and memory are impaired. She expresses impulsivity. She exhibits a depressed mood. She expresses suicidal ideation.    Review of Systems  Constitutional: Negative.   HENT: Negative.   Eyes: Negative.   Respiratory: Negative.   Cardiovascular: Negative.   Gastrointestinal: Negative.   Genitourinary: Negative.   Musculoskeletal: Negative.   Skin: Negative.   Neurological: Negative.   Psychiatric/Behavioral: Positive for depression, suicidal ideas and hallucinations. The patient is nervous/anxious.     Blood pressure 118/75, pulse 81, temperature 97.8 F (36.6 C), temperature source Oral, resp. rate 20, height '5\' 11"'  (1.803 m), weight 71.215 kg (157 lb), last menstrual period 09/09/2014.Body mass index is 21.91 kg/(m^2).  General Appearance: Disheveled  Eye Sport and exercise psychologist::  Fair  Speech:  Clear and Coherent  Volume:  Normal  Mood:  Anxious, Depressed, Dysphoric and Irritable  Affect:  Labile  Thought Process:  Disorganized  Orientation:  Full (Time, Place, and Person)  Thought Content:  Delusions, Hallucinations: Auditory Visual and Paranoid Ideation  Suicidal Thoughts:  No  Homicidal Thoughts:  Yes.  without intent/plan  Memory:  Immediate;   Fair Recent;   Fair Remote;   Poor  Judgement:  Impaired  Insight:  Lacking  Psychomotor Activity:  Restlessness  Concentration:  Poor  Recall:  [employer] of Knowledge:Fair  Language: Fair  Akathisia:  No  Handed:  Right  AIMS (if indicated):     Assets:  Physical  Health Social Support  ADL's:  Intact  Cognition: Impaired,  Mild  Sleep:  Number of Hours: 0.5   Risk to Self:   Risk to Others:   Prior Inpatient Therapy:   Prior Outpatient Therapy:    Alcohol Screening: 1. How often do you have a drink containing alcohol?: Never 9. Have you or someone else been injured as a result of your drinking?: No 10. Has a relative or friend or a doctor or another health worker been concerned about your drinking or suggested you cut down?: No Alcohol Use Disorder Identification Test Final Score (AUDIT): 0 Brief Intervention: AUDIT score less than 7 or less-screening does not suggest unhealthy drinking-brief intervention not indicated  Allergies:  No Known Allergies Lab Results:  Results for orders placed or performed during the hospital encounter of 09/09/14 (from the past 48 hour(s))  CBC WITH DIFFERENTIAL     Status: Abnormal   Collection Time: 09/09/14  6:46 PM  Result Value Ref Range   WBC 6.9 4.0 - 10.5 K/uL   RBC 3.85 (L) 3.87 - 5.11 MIL/uL   Hemoglobin 11.6 (L) 12.0 - 15.0 g/dL   HCT 34.1 (L) 36.0 - 46.0 %   MCV 88.6 78.0 - 100.0 fL   MCH 30.1 26.0 - 34.0 pg   MCHC 34.0 30.0 - 36.0 g/dL   RDW 12.7 11.5 - 15.5 %   Platelets 172 150 -  400 K/uL   Neutrophils Relative % 64 43 - 77 %   Neutro Abs 4.4 1.7 - 7.7 K/uL   Lymphocytes Relative 28 12 - 46 %   Lymphs Abs 1.9 0.7 - 4.0 K/uL   Monocytes Relative 7 3 - 12 %   Monocytes Absolute 0.5 0.1 - 1.0 K/uL   Eosinophils Relative 1 0 - 5 %   Eosinophils Absolute 0.1 0.0 - 0.7 K/uL   Basophils Relative 0 0 - 1 %   Basophils Absolute 0.0 0.0 - 0.1 K/uL  Comprehensive metabolic panel     Status: Abnormal   Collection Time: 09/09/14  6:46 PM  Result Value Ref Range   Sodium 136 135 - 145 mmol/L    Comment: Please note change in reference range.   Potassium 4.0 3.5 - 5.1 mmol/L    Comment: Please note change in reference range.   Chloride 103 96 - 112 mEq/L   CO2 25 19 - 32 mmol/L   Glucose, Bld  99 70 - 99 mg/dL   BUN 12 6 - 23 mg/dL   Creatinine, Ser 0.92 0.50 - 1.10 mg/dL   Calcium 9.5 8.4 - 10.5 mg/dL   Total Protein 8.3 6.0 - 8.3 g/dL   Albumin 4.8 3.5 - 5.2 g/dL   AST 16 0 - 37 U/L   ALT 11 0 - 35 U/L   Alkaline Phosphatase 59 39 - 117 U/L   Total Bilirubin 0.6 0.3 - 1.2 mg/dL   GFR calc non Af Amer 86 (L) >90 mL/min   GFR calc Af Amer >90 >90 mL/min    Comment: (NOTE) The eGFR has been calculated using the CKD EPI equation. This calculation has not been validated in all clinical situations. eGFR's persistently <90 mL/min signify possible Chronic Kidney Disease.    Anion gap 8 5 - 15  Ethanol     Status: None   Collection Time: 09/09/14  6:46 PM  Result Value Ref Range   Alcohol, Ethyl (B) <5 0 - 9 mg/dL    Comment:        LOWEST DETECTABLE LIMIT FOR SERUM ALCOHOL IS 11 mg/dL FOR MEDICAL PURPOSES ONLY   Drug screen panel, emergency     Status: None   Collection Time: 09/09/14  7:38 PM  Result Value Ref Range   Opiates NONE DETECTED NONE DETECTED   Cocaine NONE DETECTED NONE DETECTED   Benzodiazepines NONE DETECTED NONE DETECTED   Amphetamines NONE DETECTED NONE DETECTED   Tetrahydrocannabinol NONE DETECTED NONE DETECTED   Barbiturates NONE DETECTED NONE DETECTED    Comment:        DRUG SCREEN FOR MEDICAL PURPOSES ONLY.  IF CONFIRMATION IS NEEDED FOR ANY PURPOSE, NOTIFY LAB WITHIN 5 DAYS.        LOWEST DETECTABLE LIMITS FOR URINE DRUG SCREEN Drug Class       Cutoff (ng/mL) Amphetamine      1000 Barbiturate      200 Benzodiazepine   400 Tricyclics       867 Opiates          300 Cocaine          300 THC              50   Urinalysis, Routine w reflex microscopic     Status: Abnormal   Collection Time: 09/09/14  7:38 PM  Result Value Ref Range   Color, Urine AMBER (A) YELLOW    Comment: BIOCHEMICALS MAY BE AFFECTED BY COLOR  APPearance CLOUDY (A) CLEAR   Specific Gravity, Urine 1.025 1.005 - 1.030   pH 6.0 5.0 - 8.0   Glucose, UA NEGATIVE  NEGATIVE mg/dL   Hgb urine dipstick LARGE (A) NEGATIVE   Bilirubin Urine SMALL (A) NEGATIVE   Ketones, ur NEGATIVE NEGATIVE mg/dL   Protein, ur 30 (A) NEGATIVE mg/dL   Urobilinogen, UA 1.0 0.0 - 1.0 mg/dL   Nitrite NEGATIVE NEGATIVE   Leukocytes, UA NEGATIVE NEGATIVE  Urine microscopic-add on     Status: Abnormal   Collection Time: 09/09/14  7:38 PM  Result Value Ref Range   Squamous Epithelial / LPF RARE RARE   WBC, UA 0-2 <3 WBC/hpf   RBC / HPF 11-20 <3 RBC/hpf   Bacteria, UA RARE RARE   Casts HYALINE CASTS (A) NEGATIVE   Urine-Other MUCOUS PRESENT   POC Urine Pregnancy, ED (do NOT order at [facility])     Status: None   Collection Time: 09/09/14  7:42 PM  Result Value Ref Range   Preg Test, Ur NEGATIVE NEGATIVE    Comment:        THE SENSITIVITY OF THIS METHODOLOGY IS >24 mIU/mL    Current Medications: Current Facility-Administered Medications  Medication Dose Route Frequency Provider Last Rate Last Dose  . acetaminophen (TYLENOL) tablet 650 mg  650 mg Oral Q6H PRN Ursula Alert, MD      . alum & mag hydroxide-simeth (MAALOX/MYLANTA) 200-200-20 MG/5ML suspension 30 mL  30 mL Oral Q4H PRN Cresta Riden, MD      . benztropine (COGENTIN) tablet 0.5 mg  0.5 mg Oral QHS Halden Phegley, MD      . haloperidol (HALDOL) tablet 5 mg  5 mg Oral Q6H PRN Ursula Alert, MD       And  . benztropine (COGENTIN) tablet 1 mg  1 mg Oral Q6H PRN Ursula Alert, MD      . lamoTRIgine (LAMICTAL) tablet 25 mg  25 mg Oral Daily Arael Piccione, MD   25 mg at 09/10/14 0900  . LORazepam (ATIVAN) 0.5 MG tablet           . LORazepam (ATIVAN) tablet 0.5 mg  0.5 mg Oral Q6H PRN Jenne Campus, MD   0.5 mg at 09/10/14 1322  . magnesium hydroxide (MILK OF MAGNESIA) suspension 30 mL  30 mL Oral Daily PRN Mikki Ziff, MD      . OLANZapine zydis (ZYPREXA) disintegrating tablet 5 mg  5 mg Oral QHS Hadasa Gasner, MD      . traZODone (DESYREL) tablet 50 mg  50 mg Oral QHS PRN Ursula Alert, MD       PTA  Medications: Prescriptions prior to admission  Medication Sig Dispense Refill Last Dose  . amoxicillin-clavulanate (AUGMENTIN) 875-125 MG per tablet Take 1 tablet by mouth 2 (two) times daily. (Patient not taking: Reported on 07/19/2014) 20 tablet 0   . carbamazepine (TEGRETOL XR) 200 MG 12 hr tablet Take 200 mg by mouth at bedtime.    unknown at Unknown time  . carbamazepine (TEGRETOL-XR) 100 MG 12 hr tablet Take 1 tablet (100 mg total) by mouth 2 (two) times daily. 20 tablet 0 unknown  . medroxyPROGESTERone (DEPO-PROVERA) 150 MG/ML injection Inject 150 mg into the muscle every 3 (three) months.   unknown  . pantoprazole (PROTONIX) 40 MG tablet Take 40 mg by mouth daily.   unknown  . prazosin (MINIPRESS) 1 MG capsule Take 1 mg by mouth at bedtime.   unknown  . promethazine (PHENERGAN) 25 MG  tablet Take 1 tablet (25 mg total) by mouth every 6 (six) hours as needed for nausea or vomiting. 30 tablet 0 unknown  . risperiDONE (RISPERDAL) 2 MG tablet Take 2 mg by mouth at bedtime.  0 unknown  . sertraline (ZOLOFT) 100 MG tablet Take 100 mg by mouth daily.  0   . sertraline (ZOLOFT) 50 MG tablet Take 100 mg by mouth daily.    unknown  . sulfacetamide (BLEPH-10) 10 % ophthalmic solution Place 1 drop into the left eye every 3 (three) hours. (Patient not taking: Reported on 07/19/2014) 15 mL 0     Previous Psychotropic Medications: Yes   Substance Abuse History in the last 12 months:  No.    Consequences of Substance Abuse: NA  Results for orders placed or performed during the hospital encounter of 09/09/14 (from the past 72 hour(s))  CBC WITH DIFFERENTIAL     Status: Abnormal   Collection Time: 09/09/14  6:46 PM  Result Value Ref Range   WBC 6.9 4.0 - 10.5 K/uL   RBC 3.85 (L) 3.87 - 5.11 MIL/uL   Hemoglobin 11.6 (L) 12.0 - 15.0 g/dL   HCT 34.1 (L) 36.0 - 46.0 %   MCV 88.6 78.0 - 100.0 fL   MCH 30.1 26.0 - 34.0 pg   MCHC 34.0 30.0 - 36.0 g/dL   RDW 12.7 11.5 - 15.5 %   Platelets 172 150 -  400 K/uL   Neutrophils Relative % 64 43 - 77 %   Neutro Abs 4.4 1.7 - 7.7 K/uL   Lymphocytes Relative 28 12 - 46 %   Lymphs Abs 1.9 0.7 - 4.0 K/uL   Monocytes Relative 7 3 - 12 %   Monocytes Absolute 0.5 0.1 - 1.0 K/uL   Eosinophils Relative 1 0 - 5 %   Eosinophils Absolute 0.1 0.0 - 0.7 K/uL   Basophils Relative 0 0 - 1 %   Basophils Absolute 0.0 0.0 - 0.1 K/uL  Comprehensive metabolic panel     Status: Abnormal   Collection Time: 09/09/14  6:46 PM  Result Value Ref Range   Sodium 136 135 - 145 mmol/L    Comment: Please note change in reference range.   Potassium 4.0 3.5 - 5.1 mmol/L    Comment: Please note change in reference range.   Chloride 103 96 - 112 mEq/L   CO2 25 19 - 32 mmol/L   Glucose, Bld 99 70 - 99 mg/dL   BUN 12 6 - 23 mg/dL   Creatinine, Ser 0.92 0.50 - 1.10 mg/dL   Calcium 9.5 8.4 - 10.5 mg/dL   Total Protein 8.3 6.0 - 8.3 g/dL   Albumin 4.8 3.5 - 5.2 g/dL   AST 16 0 - 37 U/L   ALT 11 0 - 35 U/L   Alkaline Phosphatase 59 39 - 117 U/L   Total Bilirubin 0.6 0.3 - 1.2 mg/dL   GFR calc non Af Amer 86 (L) >90 mL/min   GFR calc Af Amer >90 >90 mL/min    Comment: (NOTE) The eGFR has been calculated using the CKD EPI equation. This calculation has not been validated in all clinical situations. eGFR's persistently <90 mL/min signify possible Chronic Kidney Disease.    Anion gap 8 5 - 15  Ethanol     Status: None   Collection Time: 09/09/14  6:46 PM  Result Value Ref Range   Alcohol, Ethyl (B) <5 0 - 9 mg/dL    Comment:  LOWEST DETECTABLE LIMIT FOR SERUM ALCOHOL IS 11 mg/dL FOR MEDICAL PURPOSES ONLY   Drug screen panel, emergency     Status: None   Collection Time: 09/09/14  7:38 PM  Result Value Ref Range   Opiates NONE DETECTED NONE DETECTED   Cocaine NONE DETECTED NONE DETECTED   Benzodiazepines NONE DETECTED NONE DETECTED   Amphetamines NONE DETECTED NONE DETECTED   Tetrahydrocannabinol NONE DETECTED NONE DETECTED   Barbiturates NONE DETECTED  NONE DETECTED    Comment:        DRUG SCREEN FOR MEDICAL PURPOSES ONLY.  IF CONFIRMATION IS NEEDED FOR ANY PURPOSE, NOTIFY LAB WITHIN 5 DAYS.        LOWEST DETECTABLE LIMITS FOR URINE DRUG SCREEN Drug Class       Cutoff (ng/mL) Amphetamine      1000 Barbiturate      200 Benzodiazepine   176 Tricyclics       160 Opiates          300 Cocaine          300 THC              50   Urinalysis, Routine w reflex microscopic     Status: Abnormal   Collection Time: 09/09/14  7:38 PM  Result Value Ref Range   Color, Urine AMBER (A) YELLOW    Comment: BIOCHEMICALS MAY BE AFFECTED BY COLOR   APPearance CLOUDY (A) CLEAR   Specific Gravity, Urine 1.025 1.005 - 1.030   pH 6.0 5.0 - 8.0   Glucose, UA NEGATIVE NEGATIVE mg/dL   Hgb urine dipstick LARGE (A) NEGATIVE   Bilirubin Urine SMALL (A) NEGATIVE   Ketones, ur NEGATIVE NEGATIVE mg/dL   Protein, ur 30 (A) NEGATIVE mg/dL   Urobilinogen, UA 1.0 0.0 - 1.0 mg/dL   Nitrite NEGATIVE NEGATIVE   Leukocytes, UA NEGATIVE NEGATIVE  Urine microscopic-add on     Status: Abnormal   Collection Time: 09/09/14  7:38 PM  Result Value Ref Range   Squamous Epithelial / LPF RARE RARE   WBC, UA 0-2 <3 WBC/hpf   RBC / HPF 11-20 <3 RBC/hpf   Bacteria, UA RARE RARE   Casts HYALINE CASTS (A) NEGATIVE   Urine-Other MUCOUS PRESENT   POC Urine Pregnancy, ED (do NOT order at [facility])     Status: None   Collection Time: 09/09/14  7:42 PM  Result Value Ref Range   Preg Test, Ur NEGATIVE NEGATIVE    Comment:        THE SENSITIVITY OF THIS METHODOLOGY IS >24 mIU/mL     Observation Level/Precautions:  15 minute checks  Laboratory:  HbAIC LIPID PANEL,TSH,EKG  Psychotherapy:  Group and individual therapy  Medications:  As needed  Consultations:  As needed  Discharge Concerns:stability and safety    Estimated LOS:5-7 days  Other:     Psychological Evaluations: No      Assessment: Patient with hx of Bipolar disorder ,Intellectual disability ,likely mild  ,presented with HI as well as mood swings,noncompliant on medications. Would start her on Zyprexa as well as Lamictal.Will try to obtain collateral from mother.   Treatment Plan Summary: Daily contact with patient to assess and evaluate symptoms and progress in treatment and Medication management    Patient will benefit from inpatient treatment and stabilization.  Estimated length of stay is 5-7 days.  Reviewed past medical records,treatment plan.   Will start a trial of Zyprexa Zydis 5 mg po qhs for psychosis. Will start Lamictal 25  mg po daily. Patient with hx of hypoprothrombinemia , hence Depakote may not be a good option even though she would like to be on it. Will make prn medications available for agitation.  Will continue to monitor vitals ,medication compliance and treatment side effects while patient is here.  Will monitor for medical issues as well as call consult as needed.  Reviewed labs ,will order TSH,Hba1c,lipid panel,ekg if not already done. CSW will start working on disposition.  Patient to participate in therapeutic milieu .          Medical Decision Making:  New problem, with additional work up planned, Review of Psycho-Social Stressors (1), Review or order clinical lab tests (1), Established Problem, Worsening (2), Review of Medication Regimen & Side Effects (2) and Review of New Medication or Change in Dosage (2)  I certify that inpatient services furnished can reasonably be expected to improve the patient's condition.   Shawndale Kilpatrick md 1/22/20161:39 PM

## 2014-09-10 NOTE — [facility] Suicide Risk Assessment (Deleted)
[facility] Admission Suicide Risk Assessment   Nursing information obtained from:    Demographic factors:    Current Mental Status:    Loss Factors:    Historical Factors:    Risk Reduction Factors:    Total Time spent with patient: 30 minutes Principal Problem: Bipolar 1 disorder, mixed, moderate Diagnosis:   Patient Active Problem List   Diagnosis Date Noted  . Bipolar 1 disorder, mixed, moderate [F31.62] 09/10/2014  . Intellectual disability [F79] 09/10/2014     Continued Clinical Symptoms:  Alcohol Use Disorder Identification Test Final Score (AUDIT): 0 The "Alcohol Use Disorders Identification Test", Guidelines for Use in Primary Care, Second Edition.  World Pharmacologist [facility]). Score between 0-7:  no or low risk or alcohol related problems. Score between 8-15:  moderate risk of alcohol related problems. Score between 16-19:  high risk of alcohol related problems. Score 20 or above:  warrants further diagnostic evaluation for alcohol dependence and treatment.   CLINICAL FACTORS:   Bipolar Disorder:   Mixed State Unstable or Poor Therapeutic Relationship Previous Psychiatric Diagnoses and Treatments   Musculoskeletal: Strength & Muscle Tone: within normal limits Gait & Station: normal Patient leans: N/A  Psychiatric Specialty Exam: Physical Exam  ROS  Blood pressure 118/75, pulse 81, temperature 97.8 F (36.6 C), temperature source Oral, resp. rate 20, height 5\' 11"  (1.803 m), weight 71.215 kg (157 lb), last menstrual period 09/09/2014.Body mass index is 21.91 kg/(m^2).   Please see H&P.                                     See h&p                       SUICIDE RISK:   Moderate:  Frequent suicidal ideation with limited intensity, and duration, some specificity in terms of plans, no associated intent, good self-control, limited dysphoria/symptomatology, some risk factors present, and identifiable protective factors, including available  and accessible social support.  PLAN OF CARE: Please see H&P.   Medical Decision Making:  Review of Psycho-Social Stressors (1), Review or order clinical lab tests (1), Established Problem, Worsening (2), Review of Last Therapy Session (1), Review or order medicine tests (1), Review of Medication Regimen & Side Effects (2) and Review of New Medication or Change in Dosage (2)  I certify that inpatient services furnished can reasonably be expected to improve the patient's condition.   Kimiyah Blick  md  09/10/2014, 12:52 PM

## 2014-09-10 NOTE — BHH Counselor (Signed)
Adult Comprehensive Assessment  Patient ID: Cindy Phillips, female   DOB: [birth year], 24 y.o.   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 / Lack of resources (include bankruptcy): Fixed income, dependent upon others  Living/Environment/Situation:  Living Arrangements: Parent Living conditions (as described by patient or guardian): good  "my mother takes care of me" How long has patient lived in current situation?: "All my life" What is atmosphere in current home: Comfortable, Supportive, Loving  Family History:  Marital status: Single Does patient have children?: No  Childhood History:  By whom was/is the patient raised?: Mother Additional childhood history information: "Met my father a couple of times." Description of patient's relationship with caregiver when they were a child: good Patient's description of current relationship with people who raised him/her: good Does patient have siblings?: No Did patient suffer any verbal/emotional/physical/sexual abuse as a child?: No Did patient suffer from severe childhood neglect?: No Has patient ever been sexually abused/assaulted/raped as an adolescent or adult?: No Was the patient ever a victim of a crime or a disaster?: No Witnessed domestic violence?: No Has patient been effected by domestic violence as an adult?: No  Education:  Learning disability?: Yes What learning problems does patient have?: Low IQ   Now participates in Surveyor, mining.  Employment/Work Situation:   Employment situation: On disability Why is patient on disability: "You would have to ask my mother" How long has patient been on disability: "All my life" Patient's job has been impacted by current illness: No What is the longest time patient has a held a job?: Unable to say Where was the patient employed at that  time?: Unable to say Has patient ever been in the [employer]?: No Has patient ever served in Recruitment consultant?: No  Financial Resources:   Financial resources: Teacher, early years/pre Does patient have a Programmer, applications or guardian?: No  Alcohol/Substance Abuse:      Social Support System:   Pensions consultant Support System: Good Describe Community Support System: Mother, family Type of faith/religion: Baptist How does patient's faith help to cope with current illness?: Unable to say  Leisure/Recreation:   Leisure and Hobbies: Play basketball with special olympics  Strengths/Needs:   What things does the patient do well?: basketball In what areas does patient struggle / problems for patient: Unable to say  Discharge Plan:   Does patient have access to transportation?: Yes Will patient be returning to same living situation after discharge?: Yes Currently receiving community mental health services: Yes (From Whom) Beverly Sessions) Does patient have financial barriers related to discharge medications?: No  Summary/Recommendations:   Summary and Recommendations (to be completed by the evaluator): Cindy Phillips is a 24 YO AA female who was admitted due to [facility] and [facility].  She is open at [facility], and has had multiple hospitalizations in the past, the most recent being a month ago at [facility] for same symptoms.  Her IQ is lower than average, and she depends on her mother for help, with whom she lives.  She currently has 4 misdeameanor charges, and admits that she has spent time in jail in the past.  She can benefit from crises stabilization, medication management, therapeutic milieu and referral for services.  Cindy Phillips. 09/10/2014

## 2014-09-10 NOTE — ED Notes (Signed)
Report called to [facility], [facility],  [employer] transport requested.

## 2014-09-10 NOTE — BH Assessment (Addendum)
Tele Assessment Note   Cindy Phillips is an 24 y.o. female.  -Clinician called to talk to Dr. Maryan Rued but she had gone.  Dr. Roxanne Gates was unfamiliar with this patient.  This patient was brought in on IVC.  She has HI towards someone and is also hearing voices.  Patient is very quiet and does not give much detail to questions.  She does endorse SI but has no plan.  She has no previous attempts.  Patient has HI towards a former roommate but has no plan or intention at this time.  She has been hearing muffled, unintelligible voices and also sees someone but she does not know who it is.  Patient lives with her family.  She had moved out recently to move in with this roommate but it did not work out so she had to move back with family.  Patient is non compliant with medications because she said that they make her sick.  She has not taken any since December.  Patient says she goes to [facility] and has an appt next week.  -Pt care discussed with Nena Polio, PA who said she would accept patient to [facility] [phone]   Pt will go to Dr. Shea Evans.  Dr. Doy Mince at [facility] was informed of the acceptance and will complete the 1st Opinion paper.  Axis I: Bipolar, Depressed Axis II: Deferred Axis III:  Past Medical History  Diagnosis Date  . Depression   . Bipolar 1 disorder    Axis IV: occupational problems, other psychosocial or environmental problems and problems related to social environment Axis V: 31-40 impairment in reality testing  Past Medical History:  Past Medical History  Diagnosis Date  . Depression   . Bipolar 1 disorder     History reviewed. No pertinent past surgical history.  Family History: No family history on file.  Social History:  reports that she has never smoked. She does not have any smokeless tobacco history on file. She reports that she does not drink alcohol or use illicit drugs.  Additional Social History:  Alcohol / Drug Use Pain Medications: Pt denies  Prescriptions:  Pt has not taken her psychiatric meds since December '15 Over the Counter: None History of alcohol / drug use?: No history of alcohol / drug abuse  CIWA: CIWA-Ar BP: 99/61 mmHg Pulse Rate: 69 COWS:    PATIENT STRENGTHS: (choose at least two) Communication skills Supportive family/friends  Allergies: No Known Allergies  Home Medications:  (Not in a hospital admission)  OB/GYN Status:  No LMP recorded. Patient has had an injection.  General Assessment Data Location of Assessment: [facility] ED Is this a Tele or Face-to-Face Assessment?: Tele Assessment Is this an Initial Assessment or a Re-assessment for this encounter?: Initial Assessment Living Arrangements: Other relatives (Pt lives with family.) Can pt return to current living arrangement?: Yes Admission Status: Involuntary Is patient capable of signing voluntary admission?: No Transfer from: [facility] Referral Source: Self/Family/Friend     [place] Living Arrangements: Other relatives (Pt lives with family.) Name of Psychiatrist: Beverly Sessions (Pt said she has an appt next week.)  Education Status Highest grade of school patient has completed: 12th grade  Risk to self with the past 6 months Suicidal Ideation: Yes-Currently Present Suicidal Intent: Yes-Currently Present Is patient at risk for suicide?: Yes Suicidal Plan?: No Access to Means: No What has been your use of drugs/alcohol within the last 12 months?: Pt denies Previous Attempts/Gestures: No How many times?: 0 Other  Self Harm Risks: None Triggers for Past Attempts: None known Intentional Self Injurious Behavior: None Family Suicide History: No Recent stressful life event(s): Conflict (Comment) (Had a relationship fail recently.) Persecutory voices/beliefs?: Yes Depression: Yes Depression Symptoms: Despondent, Feeling worthless/self pity, Insomnia, Loss of interest in usual pleasures, Isolating Substance abuse history and/or treatment for  substance abuse?: No Suicide prevention information given to non-admitted patients: Not applicable  Risk to Others within the past 6 months Homicidal Ideation: Yes-Currently Present Thoughts of Harm to Others: No Current Homicidal Intent: No Current Homicidal Plan: No Access to Homicidal Means: No Identified Victim: Her former roommate. History of harm to others?: Yes Assessment of Violence: In distant past Violent Behavior Description: Pt is calm and cooperative Does patient have access to weapons?: Yes (Comment) Criminal Charges Pending?: No Does patient have a court date: Yes Court Date: 09/14/14  Psychosis Hallucinations: Auditory, Visual (Can see a person; unintelligible voices.) Delusions: None noted  Mental Status Report Appear/Hygiene: Disheveled, In scrubs Eye Contact: Poor Motor Activity: Freedom of movement, Unremarkable Speech: Logical/coherent, Soft Level of Consciousness: Quiet/awake Mood: Anxious, Sad, Empty Affect: Depressed, Anxious, Sad Anxiety Level: Severe Thought Processes: Coherent, Relevant Judgement: Unimpaired Orientation: Person, Place, Situation, Time Obsessive Compulsive Thoughts/Behaviors: None  Cognitive Functioning Concentration: Decreased Memory: Recent Impaired, Remote Intact IQ: Average Insight: Poor Impulse Control: Poor Appetite: Poor Weight Loss: 0 Weight Gain: 0 Sleep: No Change Total Hours of Sleep: 6 Vegetative Symptoms: None  ADLScreening [facility] Assessment Services) Patient's cognitive ability adequate to safely complete daily activities?: Yes Patient able to express need for assistance with ADLs?: Yes Independently performs ADLs?: Yes (appropriate for developmental age)  Prior Inpatient Therapy Prior Inpatient Therapy: Yes Prior Therapy Dates: December 2015 Prior Therapy Facilty/Provider(s): [facility] Reason for Treatment: SI  Prior Outpatient Therapy Prior Outpatient Therapy: Yes Prior Therapy Dates: Years Prior Therapy  Facilty/Provider(s): [facility] Reason for Treatment: med management  ADL Screening (condition at time of admission) Patient's cognitive ability adequate to safely complete daily activities?: Yes Is the patient deaf or have difficulty hearing?: No Does the patient have difficulty seeing, even when wearing glasses/contacts?: No (Pt wears glasses.) Does the patient have difficulty concentrating, remembering, or making decisions?: Yes Patient able to express need for assistance with ADLs?: Yes Does the patient have difficulty dressing or bathing?: No Independently performs ADLs?: Yes (appropriate for developmental age) Does the patient have difficulty walking or climbing stairs?: No Weakness of Legs: None Weakness of Arms/Hands: None       Abuse/Neglect Assessment (Assessment to be complete while patient is alone) Physical Abuse: Denies Verbal Abuse: Denies Sexual Abuse: Denies Exploitation of patient/patient's resources: Denies Self-Neglect: Denies     Regulatory affairs officer (For Healthcare) Does patient have an advance directive?: No Would patient like information on creating an advanced directive?: No - patient declined information    Additional Information 1:1 In Past 12 Months?: No CIRT Risk: No Elopement Risk: No Does patient have medical clearance?: Yes     Disposition:  Disposition Initial Assessment Completed for this Encounter: Yes Disposition of Patient: Inpatient treatment program, Referred to Type of inpatient treatment program: Adult Patient referred to:  (Pt to be run by Nena Polio, PA)  Curlene Dolphin Ray 09/10/2014 12:28 AM

## 2014-09-10 NOTE — Progress Notes (Signed)
Cindy Phillips is adjusting to being in the hospital . She  Has begun to come into the dayrooms. Admission assessment is complete and she has attended 1 group..   A She has a "tic-like" movement that is observed as she stands.With one shoulder  Crunched higher than the other. She requested medicine  " for my anxiety"  At 1322 and she was given Ativan 0.5 mg po and said 1 hr later , that she felt better, but she wasn't sure if it was the medicine or not. She contracts for safety and says she is here because " I wasn't getting along with my roommqate".   R Safety in place and poc moves forward.

## 2014-09-10 NOTE — Progress Notes (Signed)
Incomplete Admission. Pt refused to signed admission forms. Pt also refused to answer assessment questions to allow writer to complete admission. Pt actively hallucinating.   Pt admitted for SI/HI/AVH. Pt identified no targets or stressors to this Probation officer. Pt was guarded and refused to complete the admission process. Pt possibly noted for "facial tics associated with tardive dyskinesia. Pt reported being off her medications for at least a month.

## 2014-09-10 NOTE — BHH Suicide Risk Assessment (Signed)
[facility] Admission Suicide Risk Assessment   Nursing information obtained from:    Demographic factors:    Current Mental Status:    Loss Factors:    Historical Factors:    Risk Reduction Factors:    Total Time spent with patient: 30 minutes Principal Problem: Bipolar 1 disorder, mixed, moderate Diagnosis:   Patient Active Problem List   Diagnosis Date Noted  . Bipolar 1 disorder, mixed, moderate [F31.62] 09/10/2014  . Intellectual disability [F79] 09/10/2014     Continued Clinical Symptoms:  Alcohol Use Disorder Identification Test Final Score (AUDIT): 0 The "Alcohol Use Disorders Identification Test", Guidelines for Use in Primary Care, Second Edition.  World Pharmacologist [facility]). Score between 0-7:  no or low risk or alcohol related problems. Score between 8-15:  moderate risk of alcohol related problems. Score between 16-19:  high risk of alcohol related problems. Score 20 or above:  warrants further diagnostic evaluation for alcohol dependence and treatment.   CLINICAL FACTORS:   Previous Psychiatric Diagnoses and Treatments Medical Diagnoses and Treatments/Surgeries   Musculoskeletal: Strength & Muscle Tone: within normal limits Gait & Station: normal Patient leans: N/A  Psychiatric Specialty Exam: Physical Exam  Review of Systems  Constitutional: Negative.   HENT: Negative.   Eyes: Negative.   Respiratory: Negative.   Cardiovascular: Negative.   Gastrointestinal: Negative.   Genitourinary: Negative.   Musculoskeletal: Negative.   Skin: Negative.   Neurological: Negative.   Psychiatric/Behavioral: Positive for depression. The patient is nervous/anxious and has insomnia.     Blood pressure 118/75, pulse 81, temperature 97.8 F (36.6 C), temperature source Oral, resp. rate 20, height 5\' 11"  (1.803 m), weight 71.215 kg (157 lb), last menstrual period 09/09/2014.Body mass index is 21.91 kg/(m^2).   SUICIDE RISK:   Mild:  Suicidal ideation of limited  frequency, intensity, duration, and specificity.  There are no identifiable plans, no associated intent, mild dysphoria and related symptoms, good self-control (both objective and subjective assessment), few other risk factors, and identifiable protective factors, including available and accessible social support.  PLAN OF CARE: Please see H&P.   Medical Decision Making:  New problem, with additional work up planned, Review of Psycho-Social Stressors (1), Order AIMS Test (2), Established Problem, Worsening (2), New Problem, with no additional work-up planned (3), Review of Last Therapy Session (1), Review of Medication Regimen & Side Effects (2) and Review of New Medication or Change in Dosage (2)  I certify that inpatient services furnished can reasonably be expected to improve the patient's condition.   Gennie Eisinger MD 09/10/2014, 9:40 AM

## 2014-09-11 DIAGNOSIS — F3163 Bipolar disorder, current episode mixed, severe, without psychotic features: Secondary | ICD-10-CM

## 2014-09-11 LAB — LIPID PANEL
CHOL/HDL RATIO: 4.1 ratio
Cholesterol: 184 mg/dL (ref 0–200)
HDL: 45 mg/dL (ref >39)
LDL CALC: 126 mg/dL — AB (ref 0–99)
Triglycerides: 66 mg/dL (ref <150)
VLDL: 13 mg/dL (ref 0–40)

## 2014-09-11 LAB — HEMOGLOBIN A1C
Hgb A1c MFr Bld: 5.4 % (ref <5.7)
Mean Plasma Glucose: 108 mg/dL (ref <117)

## 2014-09-11 LAB — TSH: TSH: 3.374 u[IU]/mL (ref 0.350–4.500)

## 2014-09-11 NOTE — Progress Notes (Signed)
.  Psychoeducational Group Note    Date: 09/11/2014 Time: 1000    Goal Setting Purpose of Group: To be able to set a goal that is measurable and that can be accomplished in one day Participation Level:  Active  Participation Quality:  Appropriate  Affect:  Flat and Lethargic  Cognitive:  Disorganized  Insight:  Limited  Engagement in Group:  Limited  Additional Comments:  Pt attended the group, paid attention and did provide feedback for a goal.  engaged in the group.  Paulino Rily

## 2014-09-11 NOTE — Progress Notes (Signed)
D) Pt has been attending the groups and interacting with her peers. Affect is brighter with interaction. Eye contact poor. Pt verbalized worrying about having to go to court this Tuesday. Wanted to make sure she would be able to go and that if not, she would be able to get a letter from the doctor. Pt rates her depression at a 5, hopelessness at a 3 and her anxiety at a 7. Admits to thoughts of SI. States "I am trying to getting voices out my head and to stop seeing the same person". States the auditory and visual hallucinations bother her a lot. Hurt her right thumb today and requested and received tylenol for the pain. A) Given support, reassurance and praise. Encouragement given. Provided with a 1:1. Therapeutic humor used. R) Contracting for her safety.

## 2014-09-11 NOTE — Progress Notes (Signed)
D: Patient sleeping on approach. Pt suspicious about her medications and needs reassurance to take it. Pt appeared to responding to internal stimuli but denied hallucinating. Pt denies pain.   A: Medications administered as prescribed. Emotional support given and will continue to monitor pt's progress for stabilization.  R: Patient remains safe and complaint with medications.

## 2014-09-11 NOTE — BHH Group Notes (Signed)
[place] LCSW Group Therapy Note  09/11/2014 / 10:30 AM  Type of Therapy and Topic:  Group Therapy: Avoiding Self-Sabotaging and Enabling Behaviors  Participation Level:  Minimal  Therapeutic Goals: 1. Patient will identify one obstacle that relates to self-sabotage and enabling behaviors 2. Patient will identify one personal self-sabotaging or enabling behavior they did prior to admission 3. Patient able to establish a plan to change the above identified behavior they did prior to admission:  4. Patient will demonstrate ability to communicate their needs through discussion and/or role plays.   Summary of Patient Progress: The main focus of today's process group was to discuss what "self-sabotage" means and use Motivational Interviewing to discuss what benefits, negative or positive, were involved in a self-identified self-sabotaging behavior. We then talked about reasons the patient may want to change the behavior and current desire to change. The patient identified with self sabotaging in the form of negative self talk. She was engaged in group process as evidenced by her tracking of group processing although she remained quiet and detached.     Therapeutic Modalities:   Cognitive Behavioral Therapy Person-Centered Therapy Motivational Interviewing   Sheilah Pigeon, LCSW

## 2014-09-11 NOTE — Progress Notes (Signed)
D: Patient mood and affect a lot brighter than yesterday. Pt more engaging to peers and staff. Pt concerned about her medication and use. Pt endorses SI/HI towards roommate at home but stated "I don't like feeling this way'. Pt reports the situation with the roommate is complicated and will not elaborate further. Pt contracted for safety and agreed to come to staff if feeling unsafe. Pt denies pain.   A: Medications administered as prescribed. Emotional support given and will continue to monitor pt's progress for stabilization.  R: Patient remains safe and complaint with medications.

## 2014-09-11 NOTE — Plan of Care (Signed)
Problem: Diagnosis: Increased Risk For Suicide Attempt Goal: STG-Patient Will Comply With Medication Regime Outcome: Progressing Pt compliant with scheduled medication regime

## 2014-09-12 MED ORDER — OLANZAPINE 10 MG PO TBDP
10.0000 mg | ORAL_TABLET | Freq: Every day | ORAL | Status: DC
  Administered 2014-09-12: 10 mg via ORAL
  Filled 2014-09-12 (×4): qty 1

## 2014-09-12 MED ORDER — HYDROXYZINE HCL 50 MG PO TABS
50.0000 mg | ORAL_TABLET | ORAL | Status: DC | PRN
  Administered 2014-09-12 – 2014-09-13 (×2): 50 mg via ORAL
  Filled 2014-09-12: qty 6
  Filled 2014-09-12 (×2): qty 1

## 2014-09-12 MED ORDER — TRAZODONE HCL 50 MG PO TABS
50.0000 mg | ORAL_TABLET | Freq: Every day | ORAL | Status: AC
  Administered 2014-09-12: 50 mg via ORAL
  Filled 2014-09-12: qty 1

## 2014-09-12 NOTE — Progress Notes (Addendum)
[facility] MD Progress Note  09/12/2014 11:03 AM Cindy Phillips  MRN:  [id number] Subjective:  "I'm ok." Objective:  Cindy Phillips is a 24 y.o. Patient presented with HI towards someone and also hearing voices.  Patient states that she is still hearing the voices and is easily irritated by other patients on the unit.  She stated it was difficult to control the voices that are muffled and unintelligible.  Reports sleep and appetite as fair.  Principal Problem: Bipolar 1 disorder, mixed, severe Diagnosis:   Patient Active Problem List   Diagnosis Date Noted  . Bipolar 1 disorder, mixed, severe [F31.63] 09/10/2014  . Intellectual disability [F79] 09/10/2014   Total Time spent with patient: 30 minutes   Past Medical History:  Past Medical History  Diagnosis Date  . Depression   . Bipolar 1 disorder    History reviewed. No pertinent past surgical history. Family History: History reviewed. No pertinent family history. Social History:  History  Alcohol Use No     History  Drug Use No    History   Social History  . Marital Status: Single    Spouse Name: N/A    Number of Children: N/A  . Years of Education: N/A   Social History Main Topics  . Smoking status: Never Smoker   . Smokeless tobacco: None  . Alcohol Use: No  . Drug Use: No  . Sexual Activity: None   Other Topics Concern  . None   Social History Narrative   Additional History:    Sleep: Fair  Appetite:  Fair  Assessment:  Cindy Phillips is a 24 yo that came in with a hx of bipolar mood disorder.  She is also experiencing AVH (voices and seeing an "unidentified" person).    Musculoskeletal: Strength & Muscle Tone: within normal limits Gait & Station: normal Patient leans: N/A  Psychiatric Specialty Exam: Physical Exam  Vitals reviewed. Psychiatric: Her mood appears anxious. Her affect is labile. She is agitated. She expresses impulsivity.    Review of Systems  Constitutional: Negative.   HENT:  Negative.   Eyes: Negative.   Respiratory: Negative.   Cardiovascular: Negative.   Gastrointestinal: Negative.   Genitourinary: Negative.   Musculoskeletal: Negative.   Skin: Negative.   Neurological: Negative.   Endo/Heme/Allergies: Negative.   Psychiatric/Behavioral: Positive for depression. Negative for suicidal ideas, hallucinations, memory loss and substance abuse. The patient is nervous/anxious. The patient does not have insomnia.     Blood pressure 125/63, pulse 85, temperature 98.7 F (37.1 C), temperature source Oral, resp. rate 18, height 5\' 11"  (1.803 m), weight 71.215 kg (157 lb), last menstrual period 09/09/2014.Body mass index is 21.91 kg/(m^2).   General Appearance: Disheveled  Eye Sport and exercise psychologist:: Fair  Speech: Clear and Coherent  Volume: Normal  Mood: Anxious, Depressed, Dysphoric and Irritable  Affect: Labile  Thought Process: Disorganized  Orientation: Full (Time, Place, and Person)  Thought Content: Delusions, Hallucinations: Auditory Visual and Paranoid Ideation  Suicidal Thoughts: No  Homicidal Thoughts: Yes. without intent/plan  Memory: Immediate; Fair Recent; Fair Remote; Poor  Judgement: Impaired  Insight: Lacking  Psychomotor Activity: Restlessness  Concentration: Poor  Recall: [employer] of Knowledge:Fair  Language: Fair  Akathisia: No  Handed: Right  AIMS (if indicated):    Assets: Physical Health Social Support  ADL's: Intact  Cognition: Impaired, Mild  Sleep: Number of Hours: 0.5    Current Medications: Current Facility-Administered Medications  Medication Dose Route Frequency Provider Last Rate Last Dose  . acetaminophen (  TYLENOL) tablet 650 mg  650 mg Oral Q6H PRN Ursula Alert, MD   650 mg at 09/11/14 0938  . alum & mag hydroxide-simeth (MAALOX/MYLANTA) 200-200-20 MG/5ML suspension 30 mL  30 mL Oral Q4H PRN Ursula Alert, MD      . benztropine (COGENTIN) tablet 0.5 mg  0.5 mg Oral  QHS Saramma Eappen, MD   0.5 mg at 09/11/14 2124  . haloperidol (HALDOL) tablet 5 mg  5 mg Oral Q6H PRN Ursula Alert, MD       And  . benztropine (COGENTIN) tablet 1 mg  1 mg Oral Q6H PRN Ursula Alert, MD      . hydrOXYzine (ATARAX/VISTARIL) tablet 25 mg  25 mg Oral Q4H PRN Ursula Alert, MD   25 mg at 09/12/14 0834  . lamoTRIgine (LAMICTAL) tablet 25 mg  25 mg Oral Daily Ursula Alert, MD   25 mg at 09/12/14 0832  . LORazepam (ATIVAN) tablet 0.5 mg  0.5 mg Oral Q6H PRN Jenne Campus, MD   0.5 mg at 09/10/14 1322  . magnesium hydroxide (MILK OF MAGNESIA) suspension 30 mL  30 mL Oral Daily PRN Ursula Alert, MD      . OLANZapine zydis (ZYPREXA) disintegrating tablet 5 mg  5 mg Oral QHS Ursula Alert, MD   5 mg at 09/11/14 2124    Lab Results:  Results for orders placed or performed during the hospital encounter of 09/10/14 (from the past 48 hour(s))  TSH     Status: None   Collection Time: 09/11/14  6:30 AM  Result Value Ref Range   TSH 3.374 0.350 - 4.500 uIU/mL    Comment: Performed at [facility]  Lipid panel     Status: Abnormal   Collection Time: 09/11/14  6:30 AM  Result Value Ref Range   Cholesterol 184 0 - 200 mg/dL   Triglycerides 66 <150 mg/dL   HDL 45 >39 mg/dL   Total CHOL/HDL Ratio 4.1 RATIO   VLDL 13 0 - 40 mg/dL   LDL Cholesterol 126 (H) 0 - 99 mg/dL    Comment:        Total Cholesterol/HDL:CHD Risk Coronary Heart Disease Risk Table                     Men   Women  1/2 Average Risk   3.4   3.3  Average Risk       5.0   4.4  2 X Average Risk   9.6   7.1  3 X Average Risk  23.4   11.0        Use the calculated Patient Ratio above and the CHD Risk Table to determine the patient's CHD Risk.        ATP III CLASSIFICATION (LDL):  <100     mg/dL   Optimal  100-129  mg/dL   Near or Above                    Optimal  130-159  mg/dL   Borderline  160-189  mg/dL   High  >190     mg/dL   Very High Performed at [facility]   Hemoglobin A1c      Status: None   Collection Time: 09/11/14  6:30 AM  Result Value Ref Range   Hgb A1c MFr Bld 5.4 <5.7 %    Comment: (NOTE)  According to the ADA Clinical Practice Recommendations for 2011, when HbA1c is used as a screening test:  >=6.5%   Diagnostic of Diabetes Mellitus           (if abnormal result is confirmed) 5.7-6.4%   Increased risk of developing Diabetes Mellitus References:Diagnosis and Classification of Diabetes Mellitus,Diabetes PYYF,1102,11(ZNBVA 1):S62-S69 and Standards of Medical Care in         Diabetes - 2011,Diabetes POLI,1030,13 (Suppl 1):S11-S61.    Mean Plasma Glucose 108 <117 mg/dL    Comment: Performed at [facility]   Physical Findings: AIMS: Facial and Oral Movements Muscles of Facial Expression: None, normal Lips and Perioral Area: None, normal Jaw: None, normal Tongue: None, normal,Extremity Movements Upper (arms, wrists, hands, fingers): None, normal Lower (legs, knees, ankles, toes): None, normal, Trunk Movements Neck, shoulders, hips: None, normal, Overall Severity Severity of abnormal movements (highest score from questions above): None, normal Incapacitation due to abnormal movements: None, normal Patient's awareness of abnormal movements (rate only patient's report): No Awareness, Dental Status Current problems with teeth and/or dentures?: No Does patient usually wear dentures?: No  CIWA:    COWS:     Treatment Plan Summary: Daily contact with patient to assess and evaluate symptoms and progress in treatment and Medication management IZyprexa zydis 5 mg daily mood control Trazadone 25 mg tonight one dose for sleep. Lamictal 25 mg daily for mood sx  Medical Decision Making:  New problem, with additional work up planned, Review of Psycho-Social Stressors (1), Discuss test with performing physician (1), New Problem, with no additional work-up planned (3), Review or order  medicine tests (1), Review of Medication Regimen & Side Effects (2) and Review of New Medication or Change in Dosage (2) Problem Points:  Established problem, worsening (2), Review of last therapy session (1) and Review of psycho-social stressors (1) Data Points:  Discuss tests with performing physician (1) Review or order clinical lab tests (1) Review or order medicine tests (1) Review and summation of old records (2) Review of medication regiment & side effects (2) Review of new medications or change in dosage (2)  AGUSTIN, [place], AGNP-BC 09/12/2014, 11:03 AM  I agreed with the findings, treatment and disposition plan of this patient. Berniece Andreas, MD

## 2014-09-12 NOTE — Plan of Care (Signed)
Problem: Diagnosis: Increased Risk For Suicide Attempt Goal: STG-Patient Will Attend All Groups On The Unit Outcome: Progressing Pt reported she attended most of group during the day. Pt attended evening wrap up group.

## 2014-09-12 NOTE — [facility] Group Notes (Signed)
[facility] Group Notes:  (Nursing/MHT/Case Management/Adjunct)  Date:  09/12/2014  Time:  1500pm  Type of Therapy:  Therapeutic Activity (ball)  Participation Level:  Minimal  Participation Quality:  Resistant  Affect:  Flat  Cognitive:  Alert  Insight:  Lacking  Engagement in Group:  Lacking and Limited  Modes of Intervention:  Activity  Summary of Progress/Problems: Patient attended group and responded to questions with "I dont have a goal" and "I don't have a favorite place." Pt remained resistant to sharing, but did attempt to participate during activity.  Charlyne Quale A 09/12/2014, 3:41 PM

## 2014-09-12 NOTE — Progress Notes (Signed)
D) Pt has attended the groups and participates in them. Affect is brighter with interaction. Pt reports that she hears voices that tell her to kill others but states "I won't do that. They tell me to kill myself too" States, " I see things too. Do you know why"? Rates depression, hopelessness and anxiety all at a 10. A) Pt given support and reassurance. Provided with several 1:1's throughout the day. Therapeutic humor used. Encouragement given. Verbal agreement made with Pt for her safety and the safety of others. R) Admits to SI but contracts for her safety.

## 2014-09-12 NOTE — Tx Team (Signed)
Initial Interdisciplinary Treatment Plan   PATIENT STRESSORS: Legal issue Medication change or noncompliance   PATIENT STRENGTHS: Ability for insight General fund of knowledge   PROBLEM LIST: Problem List/Patient Goals Date to be addressed Date deferred Reason deferred Estimated date of resolution  bipolar 09/12/2014     "I want to get back on my medication" 09/12/2014     SI/HI 09/12/2014                                          DISCHARGE CRITERIA:  Adequate post-discharge living arrangements Improved stabilization in mood, thinking, and/or behavior Verbal commitment to aftercare and medication compliance  PRELIMINARY DISCHARGE PLAN: Outpatient therapy Placement in alternative living arrangements Return to previous living arrangement  PATIENT/FAMIILY INVOLVEMENT: This treatment plan has been presented to and reviewed with the patient, Cindy Phillips, The patient and family have been given the opportunity to ask questions and make suggestions.  JEHU-APPIAH, Eugenie Harewood K 09/12/2014, 2:18 AM

## 2014-09-12 NOTE — Progress Notes (Signed)
Patient ID: Cindy Phillips, female   DOB: [birth year], 24 y.o.   MRN: [id number] [facility] MD Progress Note  09/12/2014 1:08 PM AZIZAH LISLE  MRN:  [id number] Subjective:  "I'm ok." Objective:  Cindy Phillips is a 24 y.o. Patient presented with HI towards someone and also hearing voices.  Patient states that she is still hearing the voices and is easily irritated by other patients on the unit.  Furthermore, she felt that the meds were not enough to control the voices that are muffled and unintelligible. Patient also reports VH of a person, but does not know who it is.  Reports sleep and appetite as fair.  Principal Problem: Bipolar 1 disorder, mixed, severe Diagnosis:   Patient Active Problem List   Diagnosis Date Noted  . Bipolar 1 disorder, mixed, severe [F31.63] 09/10/2014  . Intellectual disability [F79] 09/10/2014   Total Time spent with patient: 30 minutes   Past Medical History:  Past Medical History  Diagnosis Date  . Depression   . Bipolar 1 disorder    History reviewed. No pertinent past surgical history. Family History: History reviewed. No pertinent family history. Social History:  History  Alcohol Use No     History  Drug Use No    History   Social History  . Marital Status: Single    Spouse Name: N/A    Number of Children: N/A  . Years of Education: N/A   Social History Main Topics  . Smoking status: Never Smoker   . Smokeless tobacco: None  . Alcohol Use: No  . Drug Use: No  . Sexual Activity: None   Other Topics Concern  . None   Social History Narrative   Additional History:    Sleep: Fair  Appetite:  Fair  Assessment:  Cindy Phillips is a 24 yo that came in with a hx of bipolar mood disorder.  She is also experiencing AVH (voices and seeing an "unidentified" person).    Musculoskeletal: Strength & Muscle Tone: within normal limits Gait & Station: normal Patient leans: N/A  Psychiatric Specialty Exam: Physical Exam  Vitals  reviewed. Psychiatric: Her mood appears anxious. Her affect is labile. She is agitated. She expresses impulsivity.    ROS  Blood pressure 125/63, pulse 85, temperature 98.7 F (37.1 C), temperature source Oral, resp. rate 18, height 5\' 11"  (1.803 m), weight 71.215 kg (157 lb), last menstrual period 09/09/2014.Body mass index is 21.91 kg/(m^2).   General Appearance: Disheveled  Eye Sport and exercise psychologist:: Fair  Speech: Clear and Coherent  Volume: Normal  Mood: Anxious, Depressed, Dysphoric and Irritable  Affect: Labile  Thought Process: Disorganized  Orientation: Full (Time, Place, and Person)  Thought Content: Delusions, Hallucinations: Auditory Visual and Paranoid Ideation  Suicidal Thoughts: No  Homicidal Thoughts: Yes. without intent/plan  Memory: Immediate; Fair Recent; Fair Remote; Poor  Judgement: Impaired  Insight: Lacking  Psychomotor Activity: Restlessness  Concentration: Poor  Recall: [employer] of Knowledge:Fair  Language: Fair  Akathisia: No  Handed: Right  AIMS (if indicated):    Assets: Physical Health Social Support  ADL's: Intact  Cognition: Impaired, Mild  Sleep: Number of Hours: 0.5    Current Medications: Current Facility-Administered Medications  Medication Dose Route Frequency Provider Last Rate Last Dose  . acetaminophen (TYLENOL) tablet 650 mg  650 mg Oral Q6H PRN Ursula Alert, MD   650 mg at 09/11/14 0938  . alum & mag hydroxide-simeth (MAALOX/MYLANTA) 200-200-20 MG/5ML suspension 30 mL  30 mL Oral Q4H PRN Saramma Eappen,  MD      . benztropine (COGENTIN) tablet 0.5 mg  0.5 mg Oral QHS Saramma Eappen, MD   0.5 mg at 09/11/14 2124  . haloperidol (HALDOL) tablet 5 mg  5 mg Oral Q6H PRN Ursula Alert, MD       And  . benztropine (COGENTIN) tablet 1 mg  1 mg Oral Q6H PRN Ursula Alert, MD      . hydrOXYzine (ATARAX/VISTARIL) tablet 50 mg  50 mg Oral Q4H PRN Freda Munro May Agustin, NP      . lamoTRIgine  (LAMICTAL) tablet 25 mg  25 mg Oral Daily Ursula Alert, MD   25 mg at 09/12/14 9417  . LORazepam (ATIVAN) tablet 0.5 mg  0.5 mg Oral Q6H PRN Jenne Campus, MD   0.5 mg at 09/10/14 1322  . magnesium hydroxide (MILK OF MAGNESIA) suspension 30 mL  30 mL Oral Daily PRN Ursula Alert, MD      . OLANZapine zydis (ZYPREXA) disintegrating tablet 10 mg  10 mg Oral QHS Sheila May Agustin, NP      . traZODone (DESYREL) tablet 50 mg  50 mg Oral QHS Janett Labella, NP        Lab Results:  Results for orders placed or performed during the hospital encounter of 09/10/14 (from the past 48 hour(s))  TSH     Status: None   Collection Time: 09/11/14  6:30 AM  Result Value Ref Range   TSH 3.374 0.350 - 4.500 uIU/mL    Comment: Performed at [facility]  Lipid panel     Status: Abnormal   Collection Time: 09/11/14  6:30 AM  Result Value Ref Range   Cholesterol 184 0 - 200 mg/dL   Triglycerides 66 <150 mg/dL   HDL 45 >39 mg/dL   Total CHOL/HDL Ratio 4.1 RATIO   VLDL 13 0 - 40 mg/dL   LDL Cholesterol 126 (H) 0 - 99 mg/dL    Comment:        Total Cholesterol/HDL:CHD Risk Coronary Heart Disease Risk Table                     Men   Women  1/2 Average Risk   3.4   3.3  Average Risk       5.0   4.4  2 X Average Risk   9.6   7.1  3 X Average Risk  23.4   11.0        Use the calculated Patient Ratio above and the CHD Risk Table to determine the patient's CHD Risk.        ATP III CLASSIFICATION (LDL):  <100     mg/dL   Optimal  100-129  mg/dL   Near or Above                    Optimal  130-159  mg/dL   Borderline  160-189  mg/dL   High  >190     mg/dL   Very High Performed at [facility]   Hemoglobin A1c     Status: None   Collection Time: 09/11/14  6:30 AM  Result Value Ref Range   Hgb A1c MFr Bld 5.4 <5.7 %    Comment: (NOTE)  According to the ADA Clinical Practice Recommendations for 2011, when HbA1c  is used as a screening test:  >=6.5%   Diagnostic of Diabetes Mellitus           (if abnormal result is confirmed) 5.7-6.4%   Increased risk of developing Diabetes Mellitus References:Diagnosis and Classification of Diabetes Mellitus,Diabetes YFVC,9449,67(RFFMB 1):S62-S69 and Standards of Medical Care in         Diabetes - 2011,Diabetes WGYK,5993,57 (Suppl 1):S11-S61.    Mean Plasma Glucose 108 <117 mg/dL    Comment: Performed at [facility]   Physical Findings: AIMS: Facial and Oral Movements Muscles of Facial Expression: None, normal Lips and Perioral Area: None, normal Jaw: None, normal Tongue: None, normal,Extremity Movements Upper (arms, wrists, hands, fingers): None, normal Lower (legs, knees, ankles, toes): None, normal, Trunk Movements Neck, shoulders, hips: None, normal, Overall Severity Severity of abnormal movements (highest score from questions above): None, normal Incapacitation due to abnormal movements: None, normal Patient's awareness of abnormal movements (rate only patient's report): No Awareness, Dental Status Current problems with teeth and/or dentures?: No Does patient usually wear dentures?: No  CIWA:    COWS:     Treatment Plan Summary: Daily contact with patient to assess and evaluate symptoms and progress in treatment and Medication management Increased Zyprexa zydis 10 mg daily mood control Trazadone 50 mg tonight one dose for sleep. Lamictal 25 mg daily for mood sx TSH results normal 3.374.  LDL elevated 126  Medical Decision Making:  New problem, with additional work up planned, Review of Psycho-Social Stressors (1), Discuss test with performing physician (1), New Problem, with no additional work-up planned (3), Review or order medicine tests (1), Review of Medication Regimen & Side Effects (2) and Review of New Medication or Change in Dosage (2) Problem Points:  Established problem, worsening (2), Review of last therapy session (1) and Review  of psycho-social stressors (1) Data Points:  Discuss tests with performing physician (1) Review or order clinical lab tests (1) Review or order medicine tests (1) Review and summation of old records (2) Review of medication regiment & side effects (2) Review of new medications or change in dosage (2)  AGUSTIN, SHEILA MAY, AGNP-BC 09/12/2014, 1:08 PM  I agreed with the findings, treatment and disposition plan of this patient. Berniece Andreas, MD

## 2014-09-12 NOTE — BHH Group Notes (Signed)
[place] LCSW Group Therapy  09/12/2014   11:00 AM   Type of Therapy:  Group Therapy  Participation Level:  Active  Participation Quality:  Appropriate and Attentive  Affect:  Appropriate, Flat and Depressed  Cognitive:  Alert and Appropriate  Insight:  Developing/Improving and Engaged  Engagement in Therapy:  Developing/Improving and Engaged  Modes of Intervention:  Clarification, Confrontation, Discussion, Education, Exploration, Limit-setting, Orientation, Problem-solving, Rapport Building, Art therapist, Socialization and Support  Summary of Progress/Problems: The main focus of today's process group was to identify the patient's current support system and decide on other supports that can be put in place.  An emphasis was placed on using counselor, doctor, therapy groups, 12-step groups, and problem-specific support groups to expand supports, as well as doing something different than has been done before. Pt shared that her family and therapist are supportive but she doesn't like to feel like a burden or share how she is really feeling.  Pt was quiet but actively listened to group discussion.     Regan Lemming, LCSW 09/12/2014 12:21 PM

## 2014-09-13 DIAGNOSIS — F250 Schizoaffective disorder, bipolar type: Principal | ICD-10-CM

## 2014-09-13 DIAGNOSIS — F3162 Bipolar disorder, current episode mixed, moderate: Secondary | ICD-10-CM

## 2014-09-13 DIAGNOSIS — F79 Unspecified intellectual disabilities: Secondary | ICD-10-CM

## 2014-09-13 MED ORDER — OLANZAPINE 10 MG PO TBDP
15.0000 mg | ORAL_TABLET | Freq: Every day | ORAL | Status: DC
  Administered 2014-09-13: 15 mg via ORAL
  Filled 2014-09-13 (×5): qty 1

## 2014-09-13 MED ORDER — SALINE SPRAY 0.65 % NA SOLN
1.0000 | NASAL | Status: DC | PRN
  Administered 2014-09-14 (×2): 1 via NASAL
  Filled 2014-09-13: qty 44

## 2014-09-13 NOTE — Progress Notes (Signed)
D. Pt had been up and visible in milieu this evening, interacting appropriately with her peers. Pt spoke about on-going anxiety but spoke that she was feeling ok today. Pt did receive all medications without incident and did not verbalize any complaints. A. Support and encouragement provided. R. Safety maintained, will continue to monitor.

## 2014-09-13 NOTE — BHH Group Notes (Signed)
[facility] LCSW Aftercare Discharge Planning Group Note   09/13/2014 11:11 AM  Participation Quality:  Engaged  Mood/Affect:  Flat  Depression Rating:  3  Anxiety Rating:  3  Thoughts of Suicide:  No Will you contract for safety?   NA  Current AVH:  Yes  Muffled  Plan for Discharge/Comments:  States she believes the medication is helping  "It's OK."  Does not know the name of the agency with whom she follows up for services, but gave me mom's number so I could call and find out.  "I have remembering problems."  States she used to work at [employer], but "not for [employer] to find employment soon.  Stated she is working with someone to help with this.  Transportation Means: mother  Supports: mother  Trish Mage

## 2014-09-13 NOTE — Progress Notes (Signed)
D: Patient denies SI/HI and A/V hallucinations;patient had complaints of a headache; patient is reports that she did not sleep well; patient reports that she slept a few hours and woke up and could not go to back to sleep  A: Monitored q 15 minutes; patient encouraged to attend groups; patient educated about medications; patient given medications per physician orders; patient encouraged to express feelings and/or concerns  R: Patient is cooperative and was irritable after another patient escalated but calm down; patient reports relief of headache and reports she was able to get a small nap; patient's interaction with staff and peers is appropriate; patient was able to set goal to talk with staff 1:1 when having feelings of SI; patient is taking medications as prescribed and tolerating medications; patient is attending all groups

## 2014-09-13 NOTE — Progress Notes (Signed)
Patient ID: Cindy Phillips, female   DOB: 03/17/1991, 24 y.o.   MRN: [id number] [facility] MD Progress Note  09/13/2014 3:16 PM Cindy Phillips  MRN:  [id number] Subjective:  "I'm ok." Objective:  Cindy Phillips is a 24 y.o. Patient presented with HI and also AH .  Patient today appears to be improving ,with good hygiene unlike last week , fairly groomed ,is less irritable. Reports AH as reduced and VH was last on Friday ,the day that she was admitted. Patient reports she will only hurt some one if they get on her nerves ,but understands the consequences and agrees to stay away from any kind of altercations. Patient reports sleep as improved. Has concerns about dryness in her nostrils ,no other sx noted.  Per staff ,patient noted to be anxious at times ,has been compliant on medications.   Principal Problem: Bipolar 1 disorder, mixed, severe, with psychosis  Diagnosis:   DSM 5 diagnosis  Primary Psychiatric Diagnosis: Bipolar disorder type I mixed ,severe with psychosis  Secondary Psychiatric Diagnosis: Intermittent explosive disorder Intellectual disability ,mild    Non Psychiatric Diagnosis: See pmh  Patient Active Problem List   Diagnosis Date Noted  . Bipolar 1 disorder, mixed, severe [F31.63] 09/10/2014  . Intellectual disability [F79] 09/10/2014   Total Time spent with patient: 30 minutes   Past Medical History:  Past Medical History  Diagnosis Date  . Depression   . Bipolar 1 disorder    History reviewed. No pertinent past surgical history. Family History: History reviewed. No pertinent family history. Social History:  History  Alcohol Use No     History  Drug Use No    History   Social History  . Marital Status: Single    Spouse Name: N/A    Number of Children: N/A  . Years of Education: N/A   Social History Main Topics  . Smoking status: Never Smoker   . Smokeless tobacco: None  . Alcohol Use: No  . Drug Use: No  . Sexual Activity: None    Other Topics Concern  . None   Social History Narrative   Additional History:    Sleep: Fair  Appetite:  Fair  Assessment:  Cindy Phillips is a 24 yo that came in with a hx of bipolar mood disorder.  She is also experiencing AVH (voices and seeing an "unidentified" person).    Musculoskeletal: Strength & Muscle Tone: within normal limits Gait & Station: normal Patient leans: N/A  Psychiatric Specialty Exam: Physical Exam  Vitals reviewed. Psychiatric: Her mood appears anxious. Her affect is labile. She is agitated. She expresses impulsivity.    ROS  Blood pressure 115/65, pulse 61, temperature 98.5 F (36.9 C), temperature source Oral, resp. rate 16, height 5\' 11"  (1.803 m), weight 71.215 kg (157 lb), last menstrual period 09/09/2014.Body mass index is 21.91 kg/(m^2).   General Appearance: fairly groomed  Engineer, water:: Fair  Speech: Clear and Coherent  Volume: Normal  Mood: Anxious, Depressed- improved  Affect: Labile  Thought Process: Disorganized  Orientation: Full (Time, Place, and Person)  Thought Content: Delusions, Hallucinations: Auditory Visual and Paranoid Ideation- improving  Suicidal Thoughts: No  Homicidal Thoughts: denies  Memory: Immediate; Fair Recent; Fair Remote; Poor  Judgement: Impaired  Insight: Lacking  Psychomotor Activity: Restlessness  Concentration: Poor  Recall: [employer] of Knowledge:Fair  Language: Fair  Akathisia: No  Handed: Right  AIMS (if indicated):  0  Assets: Physical Health Social Support  ADL's: Intact  Cognition: Impaired,  Mild  Sleep: Number of Hours: 6    Current Medications: Current Facility-Administered Medications  Medication Dose Route Frequency Provider Last Rate Last Dose  . acetaminophen (TYLENOL) tablet 650 mg  650 mg Oral Q6H PRN Ursula Alert, MD   650 mg at 09/13/14 1252  . alum & mag hydroxide-simeth (MAALOX/MYLANTA) 200-200-20 MG/5ML suspension  30 mL  30 mL Oral Q4H PRN Ursula Alert, MD      . benztropine (COGENTIN) tablet 0.5 mg  0.5 mg Oral QHS Roque Schill, MD   0.5 mg at 09/12/14 2110  . haloperidol (HALDOL) tablet 5 mg  5 mg Oral Q6H PRN Ursula Alert, MD       And  . benztropine (COGENTIN) tablet 1 mg  1 mg Oral Q6H PRN Ursula Alert, MD      . hydrOXYzine (ATARAX/VISTARIL) tablet 50 mg  50 mg Oral Q4H PRN Freda Munro May Agustin, NP   50 mg at 09/12/14 1614  . lamoTRIgine (LAMICTAL) tablet 25 mg  25 mg Oral Daily Ursula Alert, MD   25 mg at 09/13/14 0811  . LORazepam (ATIVAN) tablet 0.5 mg  0.5 mg Oral Q6H PRN Jenne Campus, MD   0.5 mg at 09/10/14 1322  . magnesium hydroxide (MILK OF MAGNESIA) suspension 30 mL  30 mL Oral Daily PRN Ursula Alert, MD      . OLANZapine zydis (ZYPREXA) disintegrating tablet 10 mg  10 mg Oral QHS Janett Labella, NP   10 mg at 09/12/14 2110  . sodium chloride (OCEAN) 0.65 % nasal spray 1 spray  1 spray Each Nare PRN Ursula Alert, MD        Lab Results:  No results found for this or any previous visit (from the past 48 hour(s)). Physical Findings: AIMS: Facial and Oral Movements Muscles of Facial Expression: None, normal Lips and Perioral Area: None, normal Jaw: None, normal Tongue: None, normal,Extremity Movements Upper (arms, wrists, hands, fingers): None, normal Lower (legs, knees, ankles, toes): None, normal, Trunk Movements Neck, shoulders, hips: None, normal, Overall Severity Severity of abnormal movements (highest score from questions above): None, normal Incapacitation due to abnormal movements: None, normal Patient's awareness of abnormal movements (rate only patient's report): No Awareness, Dental Status Current problems with teeth and/or dentures?: No Does patient usually wear dentures?: No  CIWA:    COWS:      Assessment:  Cindy Phillips is a 24 yo AAF that came in with a hx of bipolar  Disorder as well as ID .  She is also experiencing AVH (voices and seeing an  "unidentified" person) with some improvement since admission.       Treatment Plan Summary: Daily contact with patient to assess and evaluate symptoms and progress in treatment and Medication management Increase Zyprexa zydis to 15 mg daily mood control. AIMS - 0 (09/13/14) Trazadone 50 mg tonight one dose for sleep. Lamictal 25 mg daily for mood sx Add Nasal saline to both nares as needed for congestion.  Medical Decision Making:  Review or order clinical lab tests (1), Order AIMS Test (2), New Problem, with no additional work-up planned (3), Review of Last Therapy Session (1), Review or order medicine tests (1), Review of Medication Regimen & Side Effects (2) and Review of New Medication or Change in Dosage (2) Problem Points:  Established problem, stable/improving (1), Review of last therapy session (1) and Review of psycho-social stressors (1) Data Points:  Review or order clinical lab tests (1) Review of medication regiment & side effects (  2) Review of new medications or change in dosage (2)  Chelsea Pedretti MD 09/13/2014, 3:16 PM

## 2014-09-13 NOTE — [facility] Suicide Risk Assessment (Signed)
[facility] INPATIENT:  Family/Significant Other Suicide Prevention Education  Suicide Prevention Education:  Education Completed; Cindy Phillips, mother, [phone] has been identified by the patient as the family member/significant other with whom the patient will be residing, and identified as the person(s) who will aid the patient in the event of a mental health crisis (suicidal ideations/suicide attempt).  With written consent from the patient, the family member/significant other has been provided the following suicide prevention education, prior to the and/or following the discharge of the patient.  The suicide prevention education provided includes the following:  Suicide risk factors  Suicide prevention and interventions  National Suicide Hotline telephone number  [facility] assessment telephone number  [facility] Emergency Assistance [place] and/or Residential Mobile Crisis Unit telephone number  Request made of family/significant other to:  Remove weapons (e.g., guns, rifles, knives), all items previously/currently identified as safety concern.    Remove drugs/medications (over-the-counter, prescriptions, illicit drugs), all items previously/currently identified as a safety concern.  The family member/significant other verbalizes understanding of the suicide prevention education information provided.  The family member/significant other agrees to remove the items of safety concern listed above.  Cindy Phillips 09/13/2014, 10:57 AM

## 2014-09-13 NOTE — BHH Group Notes (Signed)
[facility] LCSW Group Therapy  09/13/2014 1:15 pm  Type of Therapy: Process Group Therapy  Participation Level:  Active  Participation Quality:  Appropriate  Affect:  Flat  Cognitive:  Oriented  Insight:  Improving  Engagement in Group:  Limited  Engagement in Therapy:  Limited  Modes of Intervention:  Activity, Clarification, Education, Problem-solving and Support  Summary of Progress/Problems: Today's group addressed the issue of overcoming obstacles.  Patients were asked to identify their biggest obstacle post d/c that stands in the way of their on-going success, and then problem solve as to how to manage this.  Slept through group.  [name] B 09/13/2014   3:24 PM

## 2014-09-13 NOTE — Progress Notes (Signed)
Adult Psychoeducational Group Note  Date:  09/13/2014 Time:  10:08 PM  Group Topic/Focus:  Wrap-Up Group:   The focus of this group is to help patients review their daily goal of treatment and discuss progress on daily workbooks.  Participation Level:  Minimal  Participation Quality:  Sharing  Affect:  Appropriate  Cognitive:  Alert  Insight: Good  Engagement in Group:  Engaged  Modes of Intervention:  Discussion  Additional Comments:  Patient says that she had a pretty good day. Says that she wanted to call her mom because she has a court date on tomorrow. She says that she was a little worried about that, but she did talk to a Education officer, museum about it. Patient says that her goal is to stay on her medicine.  Donney Rankins 09/13/2014, 10:08 PM

## 2014-09-14 DIAGNOSIS — F250 Schizoaffective disorder, bipolar type: Secondary | ICD-10-CM | POA: Diagnosis present

## 2014-09-14 MED ORDER — MEDROXYPROGESTERONE ACETATE 150 MG/ML IM SUSP: 150.0000 mg | INTRAMUSCULAR | Status: DC

## 2014-09-14 MED ORDER — OLANZAPINE 5 MG PO TBDP
15.0000 mg | ORAL_TABLET | Freq: Every day | ORAL | Status: DC
  Filled 2014-09-14: qty 12

## 2014-09-14 MED ORDER — LAMOTRIGINE 25 MG PO TABS: 25.0000 mg | ORAL_TABLET | Freq: Every day | ORAL | Status: DC

## 2014-09-14 MED ORDER — LORAZEPAM 0.5 MG PO TABS: 0.5000 mg | ORAL_TABLET | Freq: Four times a day (QID) | ORAL | Status: DC | PRN

## 2014-09-14 MED ORDER — OLANZAPINE 15 MG PO TBDP: 15.0000 mg | ORAL_TABLET | Freq: Every day | ORAL | Status: DC

## 2014-09-14 MED ORDER — HYDROXYZINE HCL 50 MG PO TABS: 50.0000 mg | ORAL_TABLET | ORAL | Status: DC | PRN

## 2014-09-14 MED ORDER — BENZTROPINE MESYLATE 0.5 MG PO TABS: 0.5000 mg | ORAL_TABLET | Freq: Every day | ORAL | Status: DC

## 2014-09-14 MED ORDER — SALINE SPRAY 0.65 % NA SOLN: 1.0000 | NASAL | Status: DC | PRN

## 2014-09-14 NOTE — Tx Team (Signed)
  Interdisciplinary Treatment Plan Update   Date Reviewed:  09/14/2014  Time Reviewed:  3:20 PM  Progress in Treatment:   Attending groups: Yes Participating in groups: Yes Taking medication as prescribed: Yes  Tolerating medication: Yes Family/Significant other contact made: Yes  Patient understands diagnosis: Yes  Discussing patient identified problems/goals with staff: Yes  See initial care plan Medical problems stabilized or resolved: Yes Denies suicidal/homicidal ideation: Yes  In tx team Patient has not harmed self or others: Yes  For review of initial/current patient goals, please see plan of care.  Estimated Length of Stay:  D/C today  Reason for Continuation of Hospitalization:   New Problems/Goals identified:  N/A  Discharge Plan or Barriers:   return home, follow up outpt  Additional Comments:  Attendees:  Signature: Steva Colder, MD 09/14/2014 3:20 PM   Signature: Ripley Fraise, LCSW 09/14/2014 3:20 PM  Signature: Elmarie Shiley, NP 09/14/2014 3:20 PM  Signature: Charlyne Quale, RN 09/14/2014 3:20 PM  Signature:  09/14/2014 3:20 PM  Signature:  09/14/2014 3:20 PM  Signature:   09/14/2014 3:20 PM  Signature:    Signature:    Signature:    Signature:    Signature:    Signature:      Scribe for Treatment Team:   Ripley Fraise, LCSW  09/14/2014 3:20 PM

## 2014-09-14 NOTE — Progress Notes (Signed)
  [facility] Adult Case Management Discharge Plan :  Will you be returning to the same living situation after discharge:  Yes,  home with mother At discharge, do you have transportation home?: Yes,  mother Do you have the ability to pay for your medications: Yes,  MCD  Release of information consent forms completed and in the chart;  Patient's signature needed at discharge.  Patient to Follow up at: Follow-up Information    Follow up with [facility] On 09/15/2014.   Why:  Wednesday at 11:20AM with Pauline Good   Contact information:   [place]  [phone]      Follow up with [facility] On 09/16/2014.   Why:  Thursday at 10:00 with Ms [facility] information:   [place] [phone]      Patient denies SI/HI: Yes,  yes    Safety Planning and Suicide Prevention discussed: Yes,  yes  Has patient been referred to the Quitline?: N/A patient is not a smoker  [place] B 09/14/2014, 3:22 PM

## 2014-09-14 NOTE — Progress Notes (Signed)
D: Patient is alert and oriented. Pt's mood and affect is "good", pleasant, and appropriate to circumstance. Pt's eye contact is fair. Pt reports she is ready to go home today. Pt denies depression and hopelessness, pt rates her anxiety 4/10. Pt states her goal for the day is "taking medication like I need to." Pt denies SI/HI and AVH at this time. Pt is attending groups and is active in the milieu. Pt complained of congestion this morning, with relief from PRN medication. Pt complained of headache this afternoon 9/10 with relief from PRN medication. A: Encouragement/Support provided to pt. PRN medication administered for congestion and pain per providers orders (See MAR). Scheduled medications administered per providers orders (See MAR). 15 minute checks continued per protocol for patient safety.  R: Patient cooperative and receptive to nursing interventions. Pt remains safe.

## 2014-09-14 NOTE — Progress Notes (Signed)
D: Pt presents appropriate in affect and pleasant in mood. Pt actively participates within the milieu. Pt denies any SI/HI/AVH. Pt reports that her medications are working okay. She plans to follow-up accordingly with [facility] and other recommended services. She also wants to get back into playing basketball. Pt is compliant with her current POC.  A: Writer administered scheduled medications to pt, per MD orders. Continued support and availability as needed was extended to this pt. Staff continue to monitor pt with q3min checks.  R: No adverse drug reactions noted. Pt receptive to treatment. Pt remains safe at this time.

## 2014-09-14 NOTE — BHH Group Notes (Signed)
[facility] Group Notes:  (Nursing/MHT/Case Management/Adjunct)  Date:  09/14/2014  Time:  0920am  Type of Therapy:  Nurse Education  Participation Level:  Minimal  Participation Quality:  Attentive  Affect:  Appropriate  Cognitive:  Appropriate  Insight:  Appropriate  Engagement in Group:  Engaged  Modes of Intervention:  Discussion, Education and Support  Summary of Progress/Problems: Patient attended group and was engaged but responded minimally. Affect appropriate.  Charlyne Quale A 09/14/2014, 9:46 AM

## 2014-09-14 NOTE — Discharge Summary (Signed)
Physician Discharge Summary Note  Patient:  Cindy Phillips is an 24 y.o., female MRN:  [id number] DOB:  [birth year] Patient phone:  [phone] (home)  Patient address:   [address] [place] [address],  Total Time spent with patient: Greater than 30 minutes  Date of Admission:  09/10/2014 Date of Discharge: 09/14/13  Reason for Admission: Mood stabilization treatments  Principal Problem: Schizoaffective disorder, bipolar type Discharge Diagnoses: Patient Active Problem List   Diagnosis Date Noted  . Schizoaffective disorder, bipolar type [F25.0] 09/14/2014  . Intellectual disability [F79] 09/10/2014   Musculoskeletal: Strength & Muscle Tone: within normal limits Gait & Station: normal Patient leans: N/A  Psychiatric Specialty Exam: Physical Exam  Psychiatric: Her speech is normal and behavior is normal. Judgment and thought content normal. Her mood appears not anxious. Her affect is not angry, not blunt, not labile and not inappropriate. Cognition and memory are normal. She does not exhibit a depressed mood.    Review of Systems  Constitutional: Negative.   HENT: Negative.   Eyes: Negative.   Respiratory: Negative.   Cardiovascular: Negative.   Gastrointestinal: Negative.   Genitourinary: Negative.   Musculoskeletal: Negative.   Skin: Negative.   Neurological: Negative.   Endo/Heme/Allergies: Negative.   Psychiatric/Behavioral: Positive for depression (Stable) and hallucinations (Hx of). Negative for suicidal ideas, memory loss and substance abuse. The patient has insomnia (Stable). The patient is not nervous/anxious.     Blood pressure 102/63, pulse 91, temperature 97.6 F (36.4 C), temperature source Oral, resp. rate 18, height 5\' 11"  (1.803 m), weight 71.215 kg (157 lb), last menstrual period 09/09/2014.Body mass index is 21.91 kg/(m^2).  See Md's SRA   Past Medical History:  Past Medical History  Diagnosis Date  . Depression   . Bipolar 1 disorder     History reviewed. No pertinent past surgical history. Family History: History reviewed. No pertinent family history. Social History:  History  Alcohol Use No     History  Drug Use No    History   Social History  . Marital Status: Single    Spouse Name: N/A    Number of Children: N/A  . Years of Education: N/A   Social History Main Topics  . Smoking status: Never Smoker   . Smokeless tobacco: None  . Alcohol Use: No  . Drug Use: No  . Sexual Activity: None   Other Topics Concern  . None   Social History Narrative   Risk to Self: Is patient at risk for suicide?: No Risk to Others:   Prior Inpatient Therapy:   Prior Outpatient Therapy:    Level of Care:  OP  Hospital Course:  Cindy Phillips is a 24 y.o. AA female was brought in on IVC. Patient presented with HI towards someone and also hearing voices.Patient also has Intellectual disability , is a poor historian. She does endorse SI but has no plan. She has no previous attempts. Patient has HI towards a former roommate but has no plan or intention at this time. She has been hearing muffled, unintelligible voices . Patient also reports VH of a person, but does not know who it is. Patient with previous hx of Bipolar disorder. Patient was tried on Risperdal which made her feel sick . She was also on Abilify in the past.Patient does report some mood lability, irritability at times. Reports sleep and appetite as fair. Per initial evaluation patient lives with her family. She had moved out recently to move in with this roommate  but it did not work out so she had to move back with family. Patient used to follow up with [facility] in the past. Patient reports several hospitalizations in the past -[place] as well as [place].Patient was recently at [facility] a month ago.         Cindy Phillips was admitted to the adult unit where she was evaluated and her symptoms were identified. Medication management  was discussed and implemented. The patient was started on Zyprexa Zydis for psychosis, which was gradually increased to 15 mg at bedtime to control of symptoms. She was also started on Lamictal 25 mg daily for improved mood stability. 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 She was encouraged to participate in unit programming. Medical problems were identified and treated appropriately. Home medication was restarted as needed. She was evaluated each day by a clinical provider to ascertain the patient's response to treatment.  Improvement was noted by the patient's report of decreasing symptoms, improved sleep and appetite, affect, medication tolerance, behavior, and participation in unit programming.  The patient was asked each day to complete a self inventory noting mood, mental status, pain, new symptoms, anxiety and concerns.         She responded well to medication and being in a therapeutic and supportive environment. Positive and appropriate behavior was noted and the patient was motivated for recovery. As she responded to medications it was noted by staff that he grooming significantly improved. Patient reported a steady decrease in her auditory and visual hallucinations. Patient reports she will only hurt some one if they get on her nerves,but understands the consequences and agrees to stay away from any kind of altercations. She worked closely with the treatment team and case manager to develop a discharge plan with appropriate goals. Coping skills, problem solving as well as relaxation therapies were also part of the unit programming.         By the day of discharge she was in much improved condition than upon admission.  Symptoms were reported as significantly decreased or resolved completely. The patient denied SI/HI and voiced no AVH. She was motivated to continue taking medication with a goal of continued improvement in  mental health. Cindy Phillips was discharged home with a plan to follow up as noted below. The patient was provided with sample medications and prescriptions at time of discharge. She left [facility] in stable condition with all belongings returned to her.   Consults:  psychiatry  Significant Diagnostic Studies:  labs: CBC with diff, CMP, UDS, toxicology tests, U/A, results reviewed, no changes  Discharge Vitals:   Blood pressure 102/63, pulse 91, temperature 97.6 F (36.4 C), temperature source Oral, resp. rate 18, height 5\' 11"  (1.803 m), weight 71.215 kg (157 lb), last menstrual period 09/09/2014. Body mass index is 21.91 kg/(m^2). Lab Results:   No results found for this or any previous visit (from the past 72 hour(s)).  Physical Findings: AIMS: Facial and Oral Movements Muscles of Facial Expression: None, normal Lips and Perioral Area: None, normal Jaw: None, normal Tongue: None, normal,Extremity Movements Upper (arms, wrists, hands, fingers): None, normal Lower (legs, knees, ankles, toes): None, normal, Trunk Movements Neck, shoulders, hips: None, normal, Overall Severity Severity of abnormal movements (highest score from questions above): None, normal Incapacitation due to abnormal movements: None, normal Patient's awareness of abnormal movements (rate only patient's report): No Awareness, Dental Status Current problems with teeth  and/or dentures?: No Does patient usually wear dentures?: No  CIWA:    COWS:      See Psychiatric Specialty Exam and Suicide Risk Assessment completed by Attending Physician prior to discharge.  Discharge destination:  Home  Is patient on multiple antipsychotic therapies at discharge:  No   Has Patient had three or more failed trials of antipsychotic monotherapy by history:  No   Recommended Plan for Multiple Antipsychotic Therapies: NA    Medication List    STOP taking these medications        amoxicillin-clavulanate 875-125 MG per tablet   Commonly known as:  AUGMENTIN     carbamazepine 100 MG 12 hr tablet  Commonly known as:  TEGRETOL-XR     carbamazepine 200 MG 12 hr tablet  Commonly known as:  TEGRETOL XR     pantoprazole 40 MG tablet  Commonly known as:  PROTONIX     prazosin 1 MG capsule  Commonly known as:  MINIPRESS     promethazine 25 MG tablet  Commonly known as:  PHENERGAN     risperiDONE 2 MG tablet  Commonly known as:  RISPERDAL     sertraline 100 MG tablet  Commonly known as:  ZOLOFT     sertraline 50 MG tablet  Commonly known as:  ZOLOFT     sulfacetamide 10 % ophthalmic solution  Commonly known as:  BLEPH-10      TAKE these medications      Indication   benztropine 0.5 MG tablet  Commonly known as:  COGENTIN  Take 1 tablet (0.5 mg total) by mouth at bedtime. For prevention of drug induced involuntary movements   Indication:  Extrapyramidal Reaction caused by Medications     hydrOXYzine 50 MG tablet  Commonly known as:  ATARAX/VISTARIL  Take 1 tablet (50 mg total) by mouth every 4 (four) hours as needed for anxiety.   Indication:  Anxiety     lamoTRIgine 25 MG tablet  Commonly known as:  LAMICTAL  Take 1 tablet (25 mg total) by mouth daily. For mood stabilization   Indication:  Mood stabilization     LORazepam 0.5 MG tablet  Commonly known as:  ATIVAN  Take 1 tablet (0.5 mg total) by mouth every 6 (six) hours as needed (anxety / agitation).   Indication:  Feeling Anxious, Agitation     medroxyPROGESTERone 150 MG/ML injection  Commonly known as:  DEPO-PROVERA  Inject 1 mL (150 mg total) into the muscle every 3 (three) months. For prevention of pregnancy   Indication:  Birth control method     olanzapine zydis 15 MG disintegrating tablet  Commonly known as:  ZYPREXA  Take 1 tablet (15 mg total) by mouth at bedtime. For mood control   Indication:  Manic-Depression, Mood control     sodium chloride 0.65 % Soln nasal spray  Commonly known as:  OCEAN  Place 1 spray into both  nostrils as needed for congestion.   Indication:  Nasal congestion       Follow-up Information    Follow up with [facility] On 09/15/2014.   Why:  Wednesday at 11:20AM with Pauline Good   Contact information:   [place]  [phone]      Follow up with [facility] On 09/16/2014.   Why:  Thursday at 10:00 with Ms Consuella Lose information:   [place] [phone]  Follow-up recommendations:  Activity:  As tolerated Diet: As recommended by your primary care doctor. Keep all scheduled follow-up appointments as recommended.  Comments: Take all your medications as prescribed by your mental healthcare provider. Report any adverse effects and or reactions from your medicines to your outpatient provider promptly. Patient is instructed and cautioned to not engage in alcohol and or illegal drug use while on prescription medicines. In the event of worsening symptoms, patient is instructed to call the crisis hotline, 911 and or go to the nearest ED for appropriate evaluation and treatment of symptoms. Follow-up with your primary care provider for your other medical issues, concerns and or health care needs.   Total Discharge Time: 35 minutes  Signed: Elmarie Shiley, NP-C 09/14/2014, 7:05 PM

## 2014-09-14 NOTE — Plan of Care (Signed)
Problem: Diagnosis: Increased Risk For Suicide Attempt Goal: STG-Patient Will Report Suicidal Feelings to Staff Outcome: Progressing Pt denied any SI. Pt verbally contracts for safety with staff.

## 2014-09-14 NOTE — [facility] Suicide Risk Assessment (Signed)
[facility] Discharge Suicide Risk Assessment   Demographic Factors:  NA  Total Time spent with patient: 30 minutes  Musculoskeletal: Strength & Muscle Tone: within normal limits Gait & Station: normal Patient leans: N/A  Psychiatric Specialty Exam: Physical Exam  Review of Systems  Constitutional: Negative.   HENT: Negative.   Eyes: Negative.   Respiratory: Negative.   Cardiovascular: Negative.   Gastrointestinal: Negative.   Genitourinary: Negative.   Musculoskeletal: Negative.   Skin: Negative.   Neurological: Negative.   Psychiatric/Behavioral: Negative.     Blood pressure 102/63, pulse 91, temperature 97.6 F (36.4 C), temperature source Oral, resp. rate 18, height 5\' 11"  (1.803 m), weight 71.215 kg (157 lb), last menstrual period 09/09/2014.Body mass index is 21.91 kg/(m^2).  General Appearance: Casual  Eye Contact::  Fair  Speech:  Clear and [id number]  Volume:  Normal  Mood:  Euthymic  Affect:  Appropriate  Thought Process:  Coherent  Orientation:  Full (Time, Place, and Person)  Thought Content:  WDL  Suicidal Thoughts:  No  Homicidal Thoughts:  No  Memory:  Immediate;   Fair Recent;   Fair Remote;   Fair  Judgement:  Fair  Insight:  Fair  Psychomotor Activity:  Normal  Concentration:  Fair  Recall:  [employer] of Knowledge:Fair  Language: Fair  Akathisia:  No  Handed:  Right  AIMS (if indicated):     Assets:  Communication Skills Desire for Improvement  Sleep:  Number of Hours: 6.75  Cognition: WNL  ADL's:  Intact   Have you used any form of tobacco in the last 30 days? (Cigarettes, Smokeless Tobacco, Cigars, and/or Pipes): No  Has this patient used any form of tobacco in the last 30 days? (Cigarettes, Smokeless Tobacco, Cigars, and/or Pipes) No  Mental Status Per Nursing Assessment::   On Admission:     Current Mental Status by Physician: patient denies SI/HI/AH/VH    Loss Factors: NA  Historical Factors: Impulsivity  Risk Reduction  Factors:   Living with another person, especially a relative and Positive social support  Continued Clinical Symptoms:  Previous Psychiatric Diagnoses and Treatments  Cognitive Features That Contribute To Risk:  Polarized thinking    Suicide Risk:  Minimal: No identifiable suicidal ideation.  Patients presenting with no risk factors but with morbid ruminations; may be classified as minimal risk based on the severity of the depressive symptoms  Principal Problem: Schizoaffective disorder, bipolar type Discharge;  Diagnosis:DSM 5   Primary Psychiatric Diagnosis: Schizoaffective disorder ,bipolar type,multiple episodes ,currently in acute episode (improved)     Secondary Psychiatric Diagnosis: Intellectual disability ,likely mild      Non Psychiatric Diagnosis: See PMH     Patient Active Problem List   Diagnosis Date Noted  . Schizoaffective disorder, bipolar type [F25.0] 09/14/2014  . Intellectual disability [F79] 09/10/2014    Follow-up Information    Follow up with [facility] On 09/15/2014.   Why:  Wednesday at 11:20AM with Pauline Good   Contact information:   [place]  [phone]      Follow up with [facility] On 09/16/2014.   Why:  Thursday at 10:00 with Ms [facility] information:   [place] Care/Follow-up recommendations:  Activity:  NO RESTRICTIONS Diet:  REGULAR Tests:  NONE Other:  FOLLOW UP WITH AFTERCARE AS SCHEDULED  Is patient on multiple antipsychotic therapies  at discharge:  No   Has Patient had three or more failed trials of antipsychotic monotherapy by history:  No  Recommended Plan for Multiple Antipsychotic Therapies: NA    Jaculin Rasmus md 09/14/2014, 9:43 AM

## 2014-09-14 NOTE — Progress Notes (Signed)
DISCHARGE NOTE: D: Patient was alert, oriented, in stable condition, and ambulatory with a steady gait upon discharge. Pt denies SI/HI and AVH. A: AVS reviewed and given to pt. Follow up reviewed with pt. Resources reviewed with pt, including [facility]. Prescriptions/Medications given to pt. Belongings returned to pt. Pt given time to ask questions and express concerns. R: Pt D/C'd to mother.

## 2014-09-15 DIAGNOSIS — F250 Schizoaffective disorder, bipolar type: Secondary | ICD-10-CM | POA: Diagnosis not present

## 2014-09-17 NOTE — Progress Notes (Signed)
Patient Discharge Instructions:  After Visit Summary (AVS):   Faxed to:  09/17/14 Discharge Summary Note:   Faxed to:  09/17/14 Psychiatric Admission Assessment Note:   Faxed to:  09/17/14 Suicide Risk Assessment - Discharge Assessment:   Faxed to:  09/17/14 Faxed/Sent to the [facility] Care provider:  09/17/14 Faxed to [facility] @ [phone] Faxed to [facility] @ [place], 09/17/2014, 3:42 PM

## 2014-09-28 DIAGNOSIS — R040 Epistaxis: Secondary | ICD-10-CM | POA: Diagnosis not present

## 2014-10-07 DIAGNOSIS — F3132 Bipolar disorder, current episode depressed, moderate: Secondary | ICD-10-CM | POA: Diagnosis not present

## 2014-10-13 DIAGNOSIS — F250 Schizoaffective disorder, bipolar type: Secondary | ICD-10-CM | POA: Diagnosis not present

## 2014-10-14 DIAGNOSIS — F3132 Bipolar disorder, current episode depressed, moderate: Secondary | ICD-10-CM | POA: Diagnosis not present

## 2014-10-28 DIAGNOSIS — F3132 Bipolar disorder, current episode depressed, moderate: Secondary | ICD-10-CM | POA: Diagnosis not present

## 2014-11-04 DIAGNOSIS — F3132 Bipolar disorder, current episode depressed, moderate: Secondary | ICD-10-CM | POA: Diagnosis not present

## 2014-11-27 DIAGNOSIS — E669 Obesity, unspecified: Secondary | ICD-10-CM | POA: Diagnosis not present

## 2014-11-27 DIAGNOSIS — F319 Bipolar disorder, unspecified: Secondary | ICD-10-CM | POA: Diagnosis not present

## 2014-12-10 DIAGNOSIS — F250 Schizoaffective disorder, bipolar type: Secondary | ICD-10-CM | POA: Diagnosis not present

## 2015-03-02 DIAGNOSIS — Z3009 Encounter for other general counseling and advice on contraception: Secondary | ICD-10-CM | POA: Diagnosis not present

## 2015-03-15 DIAGNOSIS — F250 Schizoaffective disorder, bipolar type: Secondary | ICD-10-CM | POA: Diagnosis not present

## 2015-03-21 DIAGNOSIS — H10413 Chronic giant papillary conjunctivitis, bilateral: Secondary | ICD-10-CM | POA: Diagnosis not present

## 2015-05-19 DIAGNOSIS — Z3042 Encounter for surveillance of injectable contraceptive: Secondary | ICD-10-CM | POA: Diagnosis not present

## 2015-08-26 DIAGNOSIS — F259 Schizoaffective disorder, unspecified: Secondary | ICD-10-CM | POA: Diagnosis not present

## 2015-08-26 DIAGNOSIS — R4183 Borderline intellectual functioning: Secondary | ICD-10-CM | POA: Diagnosis not present

## 2015-08-30 DIAGNOSIS — M79672 Pain in left foot: Secondary | ICD-10-CM | POA: Diagnosis not present

## 2015-08-30 DIAGNOSIS — M79671 Pain in right foot: Secondary | ICD-10-CM | POA: Diagnosis not present

## 2015-09-01 DIAGNOSIS — F259 Schizoaffective disorder, unspecified: Secondary | ICD-10-CM | POA: Diagnosis not present

## 2015-09-01 DIAGNOSIS — R4183 Borderline intellectual functioning: Secondary | ICD-10-CM | POA: Diagnosis not present

## 2015-09-06 DIAGNOSIS — Z6838 Body mass index (BMI) 38.0-38.9, adult: Secondary | ICD-10-CM | POA: Diagnosis not present

## 2015-09-06 DIAGNOSIS — M79672 Pain in left foot: Secondary | ICD-10-CM | POA: Diagnosis not present

## 2015-09-09 DIAGNOSIS — F259 Schizoaffective disorder, unspecified: Secondary | ICD-10-CM | POA: Diagnosis not present

## 2015-09-09 DIAGNOSIS — R4183 Borderline intellectual functioning: Secondary | ICD-10-CM | POA: Diagnosis not present

## 2015-09-12 DIAGNOSIS — M7731 Calcaneal spur, right foot: Secondary | ICD-10-CM | POA: Diagnosis not present

## 2015-09-12 DIAGNOSIS — G5762 Lesion of plantar nerve, left lower limb: Secondary | ICD-10-CM | POA: Diagnosis not present

## 2015-09-12 DIAGNOSIS — M7732 Calcaneal spur, left foot: Secondary | ICD-10-CM | POA: Diagnosis not present

## 2015-09-12 DIAGNOSIS — M71572 Other bursitis, not elsewhere classified, left ankle and foot: Secondary | ICD-10-CM | POA: Diagnosis not present

## 2015-09-12 DIAGNOSIS — M71571 Other bursitis, not elsewhere classified, right ankle and foot: Secondary | ICD-10-CM | POA: Diagnosis not present

## 2015-09-12 DIAGNOSIS — M79672 Pain in left foot: Secondary | ICD-10-CM | POA: Diagnosis not present

## 2015-09-16 DIAGNOSIS — R4183 Borderline intellectual functioning: Secondary | ICD-10-CM | POA: Diagnosis not present

## 2015-09-16 DIAGNOSIS — F259 Schizoaffective disorder, unspecified: Secondary | ICD-10-CM | POA: Diagnosis not present

## 2015-09-24 DIAGNOSIS — R4183 Borderline intellectual functioning: Secondary | ICD-10-CM | POA: Diagnosis not present

## 2015-09-24 DIAGNOSIS — F259 Schizoaffective disorder, unspecified: Secondary | ICD-10-CM | POA: Diagnosis not present

## 2015-09-26 DIAGNOSIS — M7752 Other enthesopathy of left foot: Secondary | ICD-10-CM | POA: Diagnosis not present

## 2015-09-26 DIAGNOSIS — G5762 Lesion of plantar nerve, left lower limb: Secondary | ICD-10-CM | POA: Diagnosis not present

## 2015-09-30 DIAGNOSIS — F259 Schizoaffective disorder, unspecified: Secondary | ICD-10-CM | POA: Diagnosis not present

## 2015-09-30 DIAGNOSIS — R4183 Borderline intellectual functioning: Secondary | ICD-10-CM | POA: Diagnosis not present

## 2015-10-08 DIAGNOSIS — R4183 Borderline intellectual functioning: Secondary | ICD-10-CM | POA: Diagnosis not present

## 2015-10-08 DIAGNOSIS — F259 Schizoaffective disorder, unspecified: Secondary | ICD-10-CM | POA: Diagnosis not present

## 2015-10-21 DIAGNOSIS — F259 Schizoaffective disorder, unspecified: Secondary | ICD-10-CM | POA: Diagnosis not present

## 2015-10-21 DIAGNOSIS — R4183 Borderline intellectual functioning: Secondary | ICD-10-CM | POA: Diagnosis not present

## 2015-10-28 DIAGNOSIS — F259 Schizoaffective disorder, unspecified: Secondary | ICD-10-CM | POA: Diagnosis not present

## 2015-10-28 DIAGNOSIS — R4183 Borderline intellectual functioning: Secondary | ICD-10-CM | POA: Diagnosis not present

## 2015-10-31 DIAGNOSIS — J399 Disease of upper respiratory tract, unspecified: Secondary | ICD-10-CM | POA: Diagnosis not present

## 2015-11-04 DIAGNOSIS — F259 Schizoaffective disorder, unspecified: Secondary | ICD-10-CM | POA: Diagnosis not present

## 2015-11-04 DIAGNOSIS — R4183 Borderline intellectual functioning: Secondary | ICD-10-CM | POA: Diagnosis not present

## 2015-11-18 DIAGNOSIS — R4183 Borderline intellectual functioning: Secondary | ICD-10-CM | POA: Diagnosis not present

## 2015-11-18 DIAGNOSIS — F259 Schizoaffective disorder, unspecified: Secondary | ICD-10-CM | POA: Diagnosis not present

## 2015-12-08 DIAGNOSIS — F259 Schizoaffective disorder, unspecified: Secondary | ICD-10-CM | POA: Diagnosis not present

## 2015-12-08 DIAGNOSIS — R4183 Borderline intellectual functioning: Secondary | ICD-10-CM | POA: Diagnosis not present

## 2015-12-22 DIAGNOSIS — R4183 Borderline intellectual functioning: Secondary | ICD-10-CM | POA: Diagnosis not present

## 2015-12-22 DIAGNOSIS — F259 Schizoaffective disorder, unspecified: Secondary | ICD-10-CM | POA: Diagnosis not present

## 2015-12-26 DIAGNOSIS — G4730 Sleep apnea, unspecified: Secondary | ICD-10-CM | POA: Diagnosis not present

## 2015-12-26 DIAGNOSIS — E669 Obesity, unspecified: Secondary | ICD-10-CM | POA: Diagnosis not present

## 2015-12-26 DIAGNOSIS — F319 Bipolar disorder, unspecified: Secondary | ICD-10-CM | POA: Diagnosis not present

## 2015-12-26 DIAGNOSIS — R0683 Snoring: Secondary | ICD-10-CM | POA: Diagnosis not present

## 2016-01-05 DIAGNOSIS — R4183 Borderline intellectual functioning: Secondary | ICD-10-CM | POA: Diagnosis not present

## 2016-01-05 DIAGNOSIS — F259 Schizoaffective disorder, unspecified: Secondary | ICD-10-CM | POA: Diagnosis not present

## 2016-01-12 DIAGNOSIS — F259 Schizoaffective disorder, unspecified: Secondary | ICD-10-CM | POA: Diagnosis not present

## 2016-01-12 DIAGNOSIS — R4183 Borderline intellectual functioning: Secondary | ICD-10-CM | POA: Diagnosis not present

## 2016-01-19 DIAGNOSIS — F259 Schizoaffective disorder, unspecified: Secondary | ICD-10-CM | POA: Diagnosis not present

## 2016-01-19 DIAGNOSIS — R4183 Borderline intellectual functioning: Secondary | ICD-10-CM | POA: Diagnosis not present

## 2016-01-25 DIAGNOSIS — Z3042 Encounter for surveillance of injectable contraceptive: Secondary | ICD-10-CM | POA: Diagnosis not present

## 2016-01-26 DIAGNOSIS — R4183 Borderline intellectual functioning: Secondary | ICD-10-CM | POA: Diagnosis not present

## 2016-01-26 DIAGNOSIS — F250 Schizoaffective disorder, bipolar type: Secondary | ICD-10-CM | POA: Diagnosis not present

## 2016-01-30 DIAGNOSIS — G4730 Sleep apnea, unspecified: Secondary | ICD-10-CM | POA: Diagnosis not present

## 2016-02-02 DIAGNOSIS — F259 Schizoaffective disorder, unspecified: Secondary | ICD-10-CM | POA: Diagnosis not present

## 2016-02-02 DIAGNOSIS — R4183 Borderline intellectual functioning: Secondary | ICD-10-CM | POA: Diagnosis not present

## 2016-02-09 DIAGNOSIS — F259 Schizoaffective disorder, unspecified: Secondary | ICD-10-CM | POA: Diagnosis not present

## 2016-02-09 DIAGNOSIS — R4183 Borderline intellectual functioning: Secondary | ICD-10-CM | POA: Diagnosis not present

## 2016-02-16 DIAGNOSIS — R4183 Borderline intellectual functioning: Secondary | ICD-10-CM | POA: Diagnosis not present

## 2016-02-16 DIAGNOSIS — F259 Schizoaffective disorder, unspecified: Secondary | ICD-10-CM | POA: Diagnosis not present

## 2016-02-23 DIAGNOSIS — M722 Plantar fascial fibromatosis: Secondary | ICD-10-CM | POA: Diagnosis not present

## 2016-02-23 DIAGNOSIS — M71571 Other bursitis, not elsewhere classified, right ankle and foot: Secondary | ICD-10-CM | POA: Diagnosis not present

## 2016-02-23 DIAGNOSIS — M71572 Other bursitis, not elsewhere classified, left ankle and foot: Secondary | ICD-10-CM | POA: Diagnosis not present

## 2016-02-28 DIAGNOSIS — F259 Schizoaffective disorder, unspecified: Secondary | ICD-10-CM | POA: Diagnosis not present

## 2016-02-28 DIAGNOSIS — R4183 Borderline intellectual functioning: Secondary | ICD-10-CM | POA: Diagnosis not present

## 2016-03-08 DIAGNOSIS — R4183 Borderline intellectual functioning: Secondary | ICD-10-CM | POA: Diagnosis not present

## 2016-03-08 DIAGNOSIS — F259 Schizoaffective disorder, unspecified: Secondary | ICD-10-CM | POA: Diagnosis not present

## 2016-03-12 DIAGNOSIS — M71571 Other bursitis, not elsewhere classified, right ankle and foot: Secondary | ICD-10-CM | POA: Diagnosis not present

## 2016-03-12 DIAGNOSIS — M722 Plantar fascial fibromatosis: Secondary | ICD-10-CM | POA: Diagnosis not present

## 2016-03-12 DIAGNOSIS — M71572 Other bursitis, not elsewhere classified, left ankle and foot: Secondary | ICD-10-CM | POA: Diagnosis not present

## 2016-03-15 DIAGNOSIS — R4183 Borderline intellectual functioning: Secondary | ICD-10-CM | POA: Diagnosis not present

## 2016-03-15 DIAGNOSIS — F250 Schizoaffective disorder, bipolar type: Secondary | ICD-10-CM | POA: Diagnosis not present

## 2016-03-22 DIAGNOSIS — R4183 Borderline intellectual functioning: Secondary | ICD-10-CM | POA: Diagnosis not present

## 2016-03-22 DIAGNOSIS — F259 Schizoaffective disorder, unspecified: Secondary | ICD-10-CM | POA: Diagnosis not present

## 2016-03-26 DIAGNOSIS — M71571 Other bursitis, not elsewhere classified, right ankle and foot: Secondary | ICD-10-CM | POA: Diagnosis not present

## 2016-03-26 DIAGNOSIS — M722 Plantar fascial fibromatosis: Secondary | ICD-10-CM | POA: Diagnosis not present

## 2016-03-27 DIAGNOSIS — R300 Dysuria: Secondary | ICD-10-CM | POA: Diagnosis not present

## 2016-03-27 DIAGNOSIS — E1165 Type 2 diabetes mellitus with hyperglycemia: Secondary | ICD-10-CM | POA: Diagnosis not present

## 2016-04-02 DIAGNOSIS — R4183 Borderline intellectual functioning: Secondary | ICD-10-CM | POA: Diagnosis not present

## 2016-04-02 DIAGNOSIS — F411 Generalized anxiety disorder: Secondary | ICD-10-CM | POA: Diagnosis not present

## 2016-04-02 DIAGNOSIS — F3164 Bipolar disorder, current episode mixed, severe, with psychotic features: Secondary | ICD-10-CM | POA: Diagnosis not present

## 2016-04-02 DIAGNOSIS — F250 Schizoaffective disorder, bipolar type: Secondary | ICD-10-CM | POA: Diagnosis not present

## 2016-04-02 DIAGNOSIS — Z5181 Encounter for therapeutic drug level monitoring: Secondary | ICD-10-CM | POA: Diagnosis not present

## 2016-04-02 DIAGNOSIS — F3132 Bipolar disorder, current episode depressed, moderate: Secondary | ICD-10-CM | POA: Diagnosis not present

## 2016-04-02 DIAGNOSIS — F259 Schizoaffective disorder, unspecified: Secondary | ICD-10-CM | POA: Diagnosis not present

## 2016-04-04 DIAGNOSIS — F319 Bipolar disorder, unspecified: Secondary | ICD-10-CM | POA: Diagnosis not present

## 2016-04-04 DIAGNOSIS — E1165 Type 2 diabetes mellitus with hyperglycemia: Secondary | ICD-10-CM | POA: Diagnosis not present

## 2016-04-05 DIAGNOSIS — F250 Schizoaffective disorder, bipolar type: Secondary | ICD-10-CM | POA: Diagnosis not present

## 2016-04-05 DIAGNOSIS — R4183 Borderline intellectual functioning: Secondary | ICD-10-CM | POA: Diagnosis not present

## 2016-04-12 DIAGNOSIS — F250 Schizoaffective disorder, bipolar type: Secondary | ICD-10-CM | POA: Diagnosis not present

## 2016-04-12 DIAGNOSIS — M79671 Pain in right foot: Secondary | ICD-10-CM | POA: Diagnosis not present

## 2016-04-12 DIAGNOSIS — B079 Viral wart, unspecified: Secondary | ICD-10-CM | POA: Diagnosis not present

## 2016-04-13 ENCOUNTER — Encounter: Payer: Self-pay | Admitting: *Deleted

## 2016-04-13 ENCOUNTER — Encounter: Payer: Medicare Other | Attending: Family Medicine | Admitting: *Deleted

## 2016-04-13 DIAGNOSIS — E119 Type 2 diabetes mellitus without complications: Secondary | ICD-10-CM | POA: Diagnosis not present

## 2016-04-13 DIAGNOSIS — Z713 Dietary counseling and surveillance: Secondary | ICD-10-CM | POA: Diagnosis not present

## 2016-04-13 DIAGNOSIS — R635 Abnormal weight gain: Secondary | ICD-10-CM | POA: Insufficient documentation

## 2016-04-13 NOTE — Progress Notes (Signed)
Diabetes Self-Management Education  Visit Type: First/Initial  Appt. Start Time: 0815 Appt. End Time: 0915  04/13/2016  [name], identified by name and date of birth, is a 25 y.o. female with a diagnosis of Diabetes: Type 2.   ASSESSMENT  Height 6' (1.829 m), weight 280 lb 4.8 oz (127.1 kg). Body mass index is 38.02 kg/m.      Diabetes Self-Management Education - 04/13/16 0808      Visit Information   Visit Type First/Initial     Initial Visit   Diabetes Type Type 2   Are you taking your medications as prescribed? Yes   Date Diagnosed 03/29/2016     Health Coping   How would you rate your overall health? Fair     Psychosocial Assessment   Patient Belief/Attitude about Diabetes Afraid   Other persons present Patient;Family Member  Here with her grandmother, who she lives with   Patient Concerns Nutrition/Meal planning;Weight Control;Glycemic Control   Special Needs Simplified materials   Preferred Learning Style Auditory   Learning Readiness Contemplating   What is the last grade level you completed in school? 12     Pre-Education Assessment   Patient understands the diabetes disease and treatment process. Needs Instruction   Patient understands incorporating nutritional management into lifestyle. Needs Instruction   Patient undertands incorporating physical activity into lifestyle. Needs Instruction   Patient understands using medications safely. Needs Instruction   Patient understands monitoring blood glucose, interpreting and using results Needs Instruction   Patient understands prevention, detection, and treatment of acute complications. Needs Instruction   Patient understands prevention, detection, and treatment of chronic complications. Needs Instruction   Patient understands how to develop strategies to address psychosocial issues. Needs Instruction   Patient understands how to develop strategies to promote health/change behavior. Needs Instruction     Complications   Last HgB A1C per patient/outside source 6.5 %   How often do you check your blood sugar? 1-2 times/day   Fasting Blood glucose range (mg/dL) 70-129;130-179   Number of hypoglycemic episodes per month 0   Have you had a dilated eye exam in the past 12 months? Yes   Have you had a dental exam in the past 12 months? No   Are you checking your feet? Yes   How many days per week are you checking your feet? 2     Dietary Intake   Breakfast 2 pkts grits OR Cheerios cereal   Snack (morning) grapes   Lunch sandwich OR burger OR salad with chicken   Snack (afternoon) occasionally chips out of the bag   Dinner meat, 2-3 scoops of starch, usually a vegetable   Snack (evening) used to have chips   Beverage(s) water, 12 oz fruit juice, Kool-aid with less sugar     Exercise   Exercise Type ADL's   How many days per week to you exercise? 0   How many minutes per day do you exercise? 0   Total minutes per week of exercise 0     Patient Education   Previous Diabetes Education No   Disease state  Definition of diabetes, type 1 and 2, and the diagnosis of diabetes   Nutrition management  Role of diet in the treatment of diabetes and the relationship between the three main macronutrients and blood glucose level;Carbohydrate counting;Meal options for control of blood glucose level and chronic complications.   Physical activity and exercise  Role of exercise on diabetes management, blood pressure control and cardiac  health.;Helped patient identify appropriate exercises in relation to his/her diabetes, diabetes complications and other health issue.   Monitoring Identified appropriate SMBG and/or A1C goals.   Chronic complications Relationship between chronic complications and blood glucose control     Individualized Goals (developed by patient)   Nutrition Follow meal plan discussed   Physical Activity Exercise 3-5 times per week   Medications take my medication as prescribed    Monitoring  test my blood glucose as discussed     Post-Education Assessment   Patient understands the diabetes disease and treatment process. Demonstrates understanding / competency   Patient understands incorporating nutritional management into lifestyle. Demonstrates understanding / competency   Patient undertands incorporating physical activity into lifestyle. Demonstrates understanding / competency   Patient understands using medications safely. Demonstrates understanding / competency   Patient understands monitoring blood glucose, interpreting and using results Demonstrates understanding / competency     Outcomes   Expected Outcomes Demonstrated interest in learning. Expect positive outcomes   Future DMSE 4-6 wks   Program Status Not Completed      Individualized Plan for Diabetes Self-Management Training:   Learning Objective:  Patient will have a greater understanding of diabetes self-management. Patient education plan is to attend individual and/or group sessions per assessed needs and concerns.   Plan:   Patient Instructions  Plan:  Aim for 4 Carb Choices per meal (60 grams) +/- 1 either way  Aim for 0-2 Carbs per snack if hungry  Include protein in moderation with your meals and snacks Consider  increasing your activity level by playing basketball for 30-60 minutes daily as tolerated Continue checking BG at alternate times per day as directed by MD  Continue taking medication Metformin as directed by MD      Expected Outcomes:  Demonstrated interest in learning. Expect positive outcomes  Education material provided: Living Well with Diabetes, A1C conversion sheet, Meal plan card and Carbohydrate counting sheet  If problems or questions, patient to contact team via:  Phone and Email  Future DSME appointment: 4-6 wks

## 2016-04-13 NOTE — Patient Instructions (Signed)
Plan:  Aim for 4 Carb Choices per meal (60 grams) +/- 1 either way  Aim for 0-2 Carbs per snack if hungry  Include protein in moderation with your meals and snacks Consider  increasing your activity level by playing basketball for 30-60 minutes daily as tolerated Continue checking BG at alternate times per day as directed by MD  Continue taking medication Metformin as directed by MD

## 2016-04-18 ENCOUNTER — Encounter: Payer: Self-pay | Admitting: *Deleted

## 2016-04-18 DIAGNOSIS — Z0000 Encounter for general adult medical examination without abnormal findings: Secondary | ICD-10-CM | POA: Diagnosis not present

## 2016-04-18 DIAGNOSIS — E1165 Type 2 diabetes mellitus with hyperglycemia: Secondary | ICD-10-CM | POA: Diagnosis not present

## 2016-04-19 DIAGNOSIS — F250 Schizoaffective disorder, bipolar type: Secondary | ICD-10-CM | POA: Diagnosis not present

## 2016-04-24 DIAGNOSIS — F3132 Bipolar disorder, current episode depressed, moderate: Secondary | ICD-10-CM | POA: Diagnosis not present

## 2016-04-24 DIAGNOSIS — Z79899 Other long term (current) drug therapy: Secondary | ICD-10-CM | POA: Diagnosis not present

## 2016-04-24 DIAGNOSIS — F3164 Bipolar disorder, current episode mixed, severe, with psychotic features: Secondary | ICD-10-CM | POA: Diagnosis not present

## 2016-04-24 DIAGNOSIS — R4183 Borderline intellectual functioning: Secondary | ICD-10-CM | POA: Diagnosis not present

## 2016-04-24 DIAGNOSIS — F411 Generalized anxiety disorder: Secondary | ICD-10-CM | POA: Diagnosis not present

## 2016-04-24 DIAGNOSIS — F259 Schizoaffective disorder, unspecified: Secondary | ICD-10-CM | POA: Diagnosis not present

## 2016-04-24 DIAGNOSIS — F250 Schizoaffective disorder, bipolar type: Secondary | ICD-10-CM | POA: Diagnosis not present

## 2016-04-26 DIAGNOSIS — R4183 Borderline intellectual functioning: Secondary | ICD-10-CM | POA: Diagnosis not present

## 2016-04-26 DIAGNOSIS — F250 Schizoaffective disorder, bipolar type: Secondary | ICD-10-CM | POA: Diagnosis not present

## 2016-05-03 DIAGNOSIS — F250 Schizoaffective disorder, bipolar type: Secondary | ICD-10-CM | POA: Diagnosis not present

## 2016-05-03 DIAGNOSIS — R4183 Borderline intellectual functioning: Secondary | ICD-10-CM | POA: Diagnosis not present

## 2016-05-08 DIAGNOSIS — E119 Type 2 diabetes mellitus without complications: Secondary | ICD-10-CM | POA: Diagnosis not present

## 2016-05-10 DIAGNOSIS — F250 Schizoaffective disorder, bipolar type: Secondary | ICD-10-CM | POA: Diagnosis not present

## 2016-05-10 DIAGNOSIS — R4183 Borderline intellectual functioning: Secondary | ICD-10-CM | POA: Diagnosis not present

## 2016-05-11 DIAGNOSIS — M71571 Other bursitis, not elsewhere classified, right ankle and foot: Secondary | ICD-10-CM | POA: Diagnosis not present

## 2016-05-11 DIAGNOSIS — M722 Plantar fascial fibromatosis: Secondary | ICD-10-CM | POA: Diagnosis not present

## 2016-05-16 ENCOUNTER — Encounter: Payer: Medicare Other | Attending: Family Medicine | Admitting: *Deleted

## 2016-05-16 DIAGNOSIS — E119 Type 2 diabetes mellitus without complications: Secondary | ICD-10-CM | POA: Insufficient documentation

## 2016-05-16 DIAGNOSIS — Z713 Dietary counseling and surveillance: Secondary | ICD-10-CM | POA: Insufficient documentation

## 2016-05-16 DIAGNOSIS — R635 Abnormal weight gain: Secondary | ICD-10-CM | POA: Insufficient documentation

## 2016-05-16 NOTE — Patient Instructions (Signed)
Plan:  Aim for 4 Carb Choices per meal (60 grams) +/- 1 either way  Aim for 0-2 Carbs per snack if hungry  Include protein in moderation with your meals and snacks Continue with your activity level by playing basketball for 30-60 minutes daily as tolerated. Great job! Continue checking BG at alternate times per day as directed by MD  Continue recording your BG in your Log Book. Thank you for bringing it today, most of your numbers are within the target ranges. Continue taking medication Metformin as directed by MD

## 2016-05-16 NOTE — Progress Notes (Signed)
Diabetes Self-Management Education  Visit Type:  Follow-up  Appt. Start Time: 1130 Appt. End Time: 1200  05/16/2016  Ms. Elba Barman, identified by name and date of birth, is a 25 y.o. female with a diagnosis of Diabetes: Type 2.   ASSESSMENT  Height 6' (1.829 m), weight 280 lb (127 kg). Body mass index is 37.97 kg/m.       Diabetes Self-Management Education - [id number]      Complications   Last HgB A1C per patient/outside source --  6.5     Outcomes   Program Status Completed      Learning Objective:  Patient will have a greater understanding of diabetes self-management. Patient education plan is to attend individual and/or group sessions per assessed needs and concerns.  Plan:   Patient Instructions  Plan:  Aim for 4 Carb Choices per meal (60 grams) +/- 1 either way  Aim for 0-2 Carbs per snack if hungry  Include protein in moderation with your meals and snacks Continue with your activity level by playing basketball for 30-60 minutes daily as tolerated. Great job! Continue checking BG at alternate times per day as directed by MD  Continue recording your BG in your Log Book. Thank you for bringing it today, most of your numbers are within the target ranges. Continue taking medication Metformin as directed by MD  Expected Outcomes:  Demonstrated interest in learning. Expect positive outcomes  Education material provided: No new handouts today.   If problems or questions, patient to contact team via:  Phone and Email  Future DSME appointment: - 4-6 wks

## 2016-05-17 DIAGNOSIS — R4183 Borderline intellectual functioning: Secondary | ICD-10-CM | POA: Diagnosis not present

## 2016-05-17 DIAGNOSIS — F250 Schizoaffective disorder, bipolar type: Secondary | ICD-10-CM | POA: Diagnosis not present

## 2016-05-24 DIAGNOSIS — R4183 Borderline intellectual functioning: Secondary | ICD-10-CM | POA: Diagnosis not present

## 2016-05-24 DIAGNOSIS — F250 Schizoaffective disorder, bipolar type: Secondary | ICD-10-CM | POA: Diagnosis not present

## 2016-05-24 DIAGNOSIS — M25561 Pain in right knee: Secondary | ICD-10-CM | POA: Diagnosis not present

## 2016-05-28 DIAGNOSIS — F250 Schizoaffective disorder, bipolar type: Secondary | ICD-10-CM | POA: Diagnosis not present

## 2016-05-28 DIAGNOSIS — Z5181 Encounter for therapeutic drug level monitoring: Secondary | ICD-10-CM | POA: Diagnosis not present

## 2016-05-28 DIAGNOSIS — F3132 Bipolar disorder, current episode depressed, moderate: Secondary | ICD-10-CM | POA: Diagnosis not present

## 2016-05-28 DIAGNOSIS — F259 Schizoaffective disorder, unspecified: Secondary | ICD-10-CM | POA: Diagnosis not present

## 2016-05-28 DIAGNOSIS — R4183 Borderline intellectual functioning: Secondary | ICD-10-CM | POA: Diagnosis not present

## 2016-05-28 DIAGNOSIS — F411 Generalized anxiety disorder: Secondary | ICD-10-CM | POA: Diagnosis not present

## 2016-05-28 DIAGNOSIS — F3164 Bipolar disorder, current episode mixed, severe, with psychotic features: Secondary | ICD-10-CM | POA: Diagnosis not present

## 2016-05-31 DIAGNOSIS — R4183 Borderline intellectual functioning: Secondary | ICD-10-CM | POA: Diagnosis not present

## 2016-05-31 DIAGNOSIS — F250 Schizoaffective disorder, bipolar type: Secondary | ICD-10-CM | POA: Diagnosis not present

## 2016-06-01 DIAGNOSIS — M7752 Other enthesopathy of left foot: Secondary | ICD-10-CM | POA: Diagnosis not present

## 2016-06-01 DIAGNOSIS — M12272 Villonodular synovitis (pigmented), left ankle and foot: Secondary | ICD-10-CM | POA: Diagnosis not present

## 2016-06-01 DIAGNOSIS — M25572 Pain in left ankle and joints of left foot: Secondary | ICD-10-CM | POA: Diagnosis not present

## 2016-06-01 DIAGNOSIS — S93492A Sprain of other ligament of left ankle, initial encounter: Secondary | ICD-10-CM | POA: Diagnosis not present

## 2016-06-07 DIAGNOSIS — F250 Schizoaffective disorder, bipolar type: Secondary | ICD-10-CM | POA: Diagnosis not present

## 2016-06-07 DIAGNOSIS — R4183 Borderline intellectual functioning: Secondary | ICD-10-CM | POA: Diagnosis not present

## 2016-06-15 ENCOUNTER — Ambulatory Visit: Payer: Self-pay | Admitting: *Deleted

## 2016-06-15 DIAGNOSIS — M65872 Other synovitis and tenosynovitis, left ankle and foot: Secondary | ICD-10-CM | POA: Diagnosis not present

## 2016-06-15 DIAGNOSIS — M7672 Peroneal tendinitis, left leg: Secondary | ICD-10-CM | POA: Diagnosis not present

## 2016-06-21 DIAGNOSIS — R4183 Borderline intellectual functioning: Secondary | ICD-10-CM | POA: Diagnosis not present

## 2016-06-21 DIAGNOSIS — F250 Schizoaffective disorder, bipolar type: Secondary | ICD-10-CM | POA: Diagnosis not present

## 2016-06-25 DIAGNOSIS — E119 Type 2 diabetes mellitus without complications: Secondary | ICD-10-CM | POA: Diagnosis not present

## 2016-06-27 DIAGNOSIS — Z3042 Encounter for surveillance of injectable contraceptive: Secondary | ICD-10-CM | POA: Diagnosis not present

## 2016-07-02 DIAGNOSIS — F3164 Bipolar disorder, current episode mixed, severe, with psychotic features: Secondary | ICD-10-CM | POA: Diagnosis not present

## 2016-07-02 DIAGNOSIS — F411 Generalized anxiety disorder: Secondary | ICD-10-CM | POA: Diagnosis not present

## 2016-07-02 DIAGNOSIS — F3132 Bipolar disorder, current episode depressed, moderate: Secondary | ICD-10-CM | POA: Diagnosis not present

## 2016-07-02 DIAGNOSIS — F250 Schizoaffective disorder, bipolar type: Secondary | ICD-10-CM | POA: Diagnosis not present

## 2016-07-02 DIAGNOSIS — F259 Schizoaffective disorder, unspecified: Secondary | ICD-10-CM | POA: Diagnosis not present

## 2016-07-02 DIAGNOSIS — Z5181 Encounter for therapeutic drug level monitoring: Secondary | ICD-10-CM | POA: Diagnosis not present

## 2016-07-05 DIAGNOSIS — F250 Schizoaffective disorder, bipolar type: Secondary | ICD-10-CM | POA: Diagnosis not present

## 2016-07-05 DIAGNOSIS — R4183 Borderline intellectual functioning: Secondary | ICD-10-CM | POA: Diagnosis not present

## 2016-07-09 DIAGNOSIS — R4183 Borderline intellectual functioning: Secondary | ICD-10-CM | POA: Diagnosis not present

## 2016-07-09 DIAGNOSIS — F250 Schizoaffective disorder, bipolar type: Secondary | ICD-10-CM | POA: Diagnosis not present

## 2016-07-13 ENCOUNTER — Emergency Department (HOSPITAL_COMMUNITY)
Admission: EM | Admit: 2016-07-13 | Discharge: 2016-07-13 | Disposition: A | Discharge status: Home / Self Care | Payer: Medicare Other | Attending: Emergency Medicine | Admitting: Emergency Medicine

## 2016-07-13 ENCOUNTER — Encounter: Payer: Self-pay | Admitting: Emergency Medicine

## 2016-07-13 DIAGNOSIS — Z7984 Long term (current) use of oral hypoglycemic drugs: Secondary | ICD-10-CM | POA: Insufficient documentation

## 2016-07-13 DIAGNOSIS — B376 Candidal endocarditis: Secondary | ICD-10-CM | POA: Diagnosis not present

## 2016-07-13 DIAGNOSIS — B379 Candidiasis, unspecified: Secondary | ICD-10-CM

## 2016-07-13 DIAGNOSIS — R21 Rash and other nonspecific skin eruption: Secondary | ICD-10-CM | POA: Diagnosis present

## 2016-07-13 MED ORDER — NYSTATIN-TRIAMCINOLONE 100000-0.1 UNIT/GM-% EX CREA: TOPICAL_CREAM | CUTANEOUS | 0 refills | Status: DC

## 2016-07-13 MED ORDER — FLUCONAZOLE 100 MG PO TABS
200.0000 mg | ORAL_TABLET | Freq: Once | ORAL | Status: AC
  Administered 2016-07-13: 200 mg via ORAL
  Filled 2016-07-13: qty 2

## 2016-07-13 NOTE — ED Triage Notes (Signed)
Pt reports rash below her right buttocks. She does not know how long it has been there. No redness or swelling noted.

## 2016-07-13 NOTE — ED Provider Notes (Signed)
[facility] DEPT Provider Note   CSN: [id number] Arrival date & time: 07/13/16  1724   By signing my name below, I, Cindy Phillips, attest that this documentation has been prepared under the direction and in the presence of Cindy Baller, NP. Electronically Signed: Neta Phillips, ED Scribe. 07/13/2016. 5:51 PM.   History   Chief Complaint Chief Complaint  Patient presents with  . Rash     The history is provided by the patient. No language interpreter was used.  Rash   This is a new problem. The current episode started 1 to 2 hours ago. The problem has not changed since onset.The problem is associated with nothing. There has been no fever. The rash is present on the right buttock. The pain is moderate. The pain has been constant since onset. Associated symptoms include pain. Pertinent negatives include no itching. She has tried nothing for the symptoms.   HPI Comments:  Cindy Phillips is a 25 y.o. female who presents to the Emergency Department complaining of a constant rash to her right buttock. Pt is unsure of the onset, but just noticed the rash today. Pt states that the rash is burning, but not itching. No alleviating factors noted. Pt denies fever, chills, nausea, vomiting, abdominal pain.    Past Medical History:  Diagnosis Date  . Cindy Phillips (Cindy Phillips)   . Depression     Patient Active Problem List   Diagnosis Date Noted  . Cindy Phillips, Cindy type (Cindy Phillips) 09/14/2014  . Intellectual Phillips 09/10/2014    No past surgical history on file.  OB History    No data available       Home Medications    Prior to Admission medications   Medication Sig Start Date End Date Taking? Authorizing Provider  benztropine (COGENTIN) 0.5 MG tablet Take 1 tablet (0.5 mg total) by mouth at bedtime. For prevention of drug induced involuntary movements 09/14/14   Cindy Slates, NP  hydrOXYzine (ATARAX/VISTARIL) 50 MG tablet Take 1 tablet (50 mg total)  by mouth every 4 (four) hours as needed for anxiety. 09/14/14   Cindy Slates, NP  lamoTRIgine (LAMICTAL) 25 MG tablet Take 1 tablet (25 mg total) by mouth daily. For mood stabilization Patient not taking: Reported on 05/16/2016 09/14/14   Cindy Slates, NP  LORazepam (ATIVAN) 0.5 MG tablet Take 1 tablet (0.5 mg total) by mouth every 6 (six) hours as needed (anxety / agitation). 09/14/14   Cindy Slates, NP  medroxyPROGESTERone (DEPO-PROVERA) 150 MG/ML injection Inject 1 mL (150 mg total) into the muscle every 3 (three) months. For prevention of pregnancy 09/14/14   Cindy Slates, NP  metFORMIN (GLUCOPHAGE) 500 MG tablet Take 500 mg by mouth 2 (two) times daily with a meal.    Historical Provider, MD  nystatin-triamcinolone (MYCOLOG II) cream Apply to affected area daily 07/13/16   Cindy [place] Bunnie Pion, NP  OLANZapine zydis (ZYPREXA) 15 MG disintegrating tablet Take 1 tablet (15 mg total) by mouth at bedtime. For mood control 09/14/14   Cindy Slates, NP  sodium chloride (OCEAN) 0.65 % SOLN nasal spray Place 1 spray into both nostrils as needed for congestion. 09/14/14   Cindy Slates, NP    Family History No family history on file.  Social History Social History  Substance Use Topics  . Smoking status: Never Smoker  . Smokeless tobacco: Not on file  . Alcohol use No     Allergies   Patient has no  known allergies.   Review of Systems Review of Systems  Constitutional: Negative for chills and fever.  Gastrointestinal: Negative for abdominal pain, diarrhea, nausea and vomiting.  Skin: Positive for rash. Negative for itching.  All other systems reviewed and are negative.    Physical Exam Updated Vital Signs BP 152/87   Pulse 101   Temp 97.9 F (36.6 C) (Oral)   Resp 18   SpO2 99%   Physical Exam  Constitutional: She is oriented to person, place, and time. No distress.  Morbidly obese  HENT:  Head: Normocephalic and atraumatic.  Eyes: Conjunctivae are normal.  Neck: Neck supple.    Cardiovascular: Normal rate.   Pulmonary/Chest: Effort normal.  Musculoskeletal: Normal range of motion.  Neurological: She is alert and oriented to person, place, and time.  Skin: Skin is warm and dry.  Skin between folds of buttocks wet, denuded, consistent with yeast infection. There is brown stool in the area and a large amount of stool in the patient's panties.   Psychiatric: She has a normal mood and affect. Her behavior is normal.  Nursing note and vitals reviewed.    ED Treatments / Results  DIAGNOSTIC STUDIES:  Oxygen Saturation is 99% on RA, normal by my interpretation.    COORDINATION OF CARE:  5:52 PM Discussed treatment plan with pt at bedside and pt agreed to plan.   Labs (all labs ordered are listed, but only abnormal results are displayed) Labs Reviewed - No data to display   Radiology No results found.  Procedures Procedures (including critical care time)  Medications Ordered in ED Medications  fluconazole (DIFLUCAN) tablet 200 mg (200 mg Oral Given 07/13/16 1819)     Initial Impression / Assessment and Plan / ED Course   Patient with yeast infection. Will treat with anti-fungal medication. Pt instructed to keep the area dry. Contact precautions given. No signs of secondary infection. Follow up with PCP in 2-3 days. Return precautions discussed. Pt is safe for discharge at this time.     I have reviewed the triage vital signs and the nursing notes.   Clinical Course     Final Clinical Impressions(s) / ED Diagnoses   Final diagnoses:  Yeast infection    New Prescriptions Discharge Medication List as of 07/13/2016  6:02 PM    START taking these medications   Details  nystatin-triamcinolone (MYCOLOG II) cream Apply to affected area daily, Print      I personally performed the services described in this documentation, which was scribed in my presence. The recorded information has been reviewed and is accurate.     Cindy Grande,  NP 07/14/16 2243    Leo Grosser, MD 07/14/16 [phone]

## 2016-07-17 DIAGNOSIS — F250 Schizoaffective disorder, bipolar type: Secondary | ICD-10-CM | POA: Diagnosis not present

## 2016-07-17 DIAGNOSIS — R4183 Borderline intellectual functioning: Secondary | ICD-10-CM | POA: Diagnosis not present

## 2016-07-24 ENCOUNTER — Encounter: Payer: Medicare Other | Attending: Family Medicine | Admitting: *Deleted

## 2016-07-24 DIAGNOSIS — E119 Type 2 diabetes mellitus without complications: Secondary | ICD-10-CM | POA: Diagnosis not present

## 2016-07-24 DIAGNOSIS — Z713 Dietary counseling and surveillance: Secondary | ICD-10-CM | POA: Insufficient documentation

## 2016-07-24 DIAGNOSIS — R635 Abnormal weight gain: Secondary | ICD-10-CM | POA: Insufficient documentation

## 2016-07-26 DIAGNOSIS — R4183 Borderline intellectual functioning: Secondary | ICD-10-CM | POA: Diagnosis not present

## 2016-07-26 DIAGNOSIS — F250 Schizoaffective disorder, bipolar type: Secondary | ICD-10-CM | POA: Diagnosis not present

## 2016-07-27 DIAGNOSIS — E119 Type 2 diabetes mellitus without complications: Secondary | ICD-10-CM | POA: Diagnosis not present

## 2016-07-27 DIAGNOSIS — B353 Tinea pedis: Secondary | ICD-10-CM | POA: Diagnosis not present

## 2016-07-27 DIAGNOSIS — Z6835 Body mass index (BMI) 35.0-35.9, adult: Secondary | ICD-10-CM | POA: Diagnosis not present

## 2016-07-27 NOTE — Progress Notes (Signed)
Diabetes Self-Management Education  Visit Type:  Follow-up  Appt. Start Time: 1530 Appt. End Time: 1600  07/24/2016  [name], identified by name and date of birth, is a 25 y.o. female with a diagnosis of Diabetes: Type 2.  She is happy with her continued activity level and weight loss of 3.5 pounds since last visit. She is not comfortable with Carb Counting so we will try something else this visit. She does check her BG 3-4 times a week with reported BG of 80-120 mg/dl pre and post meals  ASSESSMENT  Height 6' (1.829 m), weight 276 lb 8 oz (125.4 kg). Body mass index is 37.5 kg/m.       Diabetes Self-Management Education - 07/27/16 1400      Psychosocial Assessment   Patient Concerns Nutrition/Meal planning;Glycemic Control;Weight Control   Special Needs Simplified materials   Learning Readiness Change in progress     Complications   Fasting Blood glucose range (mg/dL) 70-129   Postprandial Blood glucose range (mg/dL) 130-179   Number of hypoglycemic episodes per month --  0     Exercise   Exercise Type Light (walking / raking leaves)   How many days per week to you exercise? --  4   How many minutes per day do you exercise? --  60 minutes     Individualized Goals (developed by patient)   Nutrition General guidelines for healthy choices and portions discussed  patient prefers Plate Method to Carb Counting.   Physical Activity Exercise 3-5 times per week   Medications take my medication as prescribed   Monitoring  test blood glucose pre and post meals as discussed     Patient Self-Evaluation of Goals - Patient rates self as meeting previously set goals (% of time)   Nutrition 50 - 75 %   Physical Activity 50 - 75 %   Medications >75%   Monitoring >75%     Outcomes   Program Status Completed     Subsequent Visit   Since your last visit have you continued or begun to take your medications as prescribed? Yes   Since your last visit have you experienced  any weight changes? Loss   Weight Loss (lbs) 3.5  3.5   Since your last visit, are you checking your blood glucose at least once a day? Yes      Learning Objective:  Patient will have a greater understanding of diabetes self-management. Patient education plan is to attend individual and/or group sessions per assessed needs and concerns.   Plan:   Patient Instructions  Plan:  Use the My Plate idea with vegetables on 1/2 of you plate, meat and starch sharing the other half.  Continue with your activity level by walking for 60 minutes every other day as tolerated Continue checking BG at alternate times per day   Continue taking medication as directed by MD    Expected Outcomes:  Demonstrated interest in learning. Expect positive outcomes  Education material provided: My Plate  If problems or questions, patient to contact team via:  Phone and Email  Future DSME appointment: - 4-6 wks

## 2016-07-27 NOTE — Patient Instructions (Signed)
Plan:  Use the My Plate idea with vegetables on 1/2 of you plate, meat and starch sharing the other half.  Continue with your activity level by walking for 60 minutes every other day as tolerated Continue checking BG at alternate times per day   Continue taking medication as directed by MD

## 2016-07-30 DIAGNOSIS — F3164 Bipolar disorder, current episode mixed, severe, with psychotic features: Secondary | ICD-10-CM | POA: Diagnosis not present

## 2016-07-30 DIAGNOSIS — F410 Panic disorder [episodic paroxysmal anxiety] without agoraphobia: Secondary | ICD-10-CM | POA: Diagnosis not present

## 2016-07-30 DIAGNOSIS — F3132 Bipolar disorder, current episode depressed, moderate: Secondary | ICD-10-CM | POA: Diagnosis not present

## 2016-07-30 DIAGNOSIS — Z5181 Encounter for therapeutic drug level monitoring: Secondary | ICD-10-CM | POA: Diagnosis not present

## 2016-07-30 DIAGNOSIS — F259 Schizoaffective disorder, unspecified: Secondary | ICD-10-CM | POA: Diagnosis not present

## 2016-07-30 DIAGNOSIS — F250 Schizoaffective disorder, bipolar type: Secondary | ICD-10-CM | POA: Diagnosis not present

## 2016-08-02 DIAGNOSIS — M722 Plantar fascial fibromatosis: Secondary | ICD-10-CM | POA: Diagnosis not present

## 2016-08-02 DIAGNOSIS — R4183 Borderline intellectual functioning: Secondary | ICD-10-CM | POA: Diagnosis not present

## 2016-08-02 DIAGNOSIS — M71572 Other bursitis, not elsewhere classified, left ankle and foot: Secondary | ICD-10-CM | POA: Diagnosis not present

## 2016-08-02 DIAGNOSIS — F250 Schizoaffective disorder, bipolar type: Secondary | ICD-10-CM | POA: Diagnosis not present

## 2016-08-09 DIAGNOSIS — F250 Schizoaffective disorder, bipolar type: Secondary | ICD-10-CM | POA: Diagnosis not present

## 2016-08-09 DIAGNOSIS — R4183 Borderline intellectual functioning: Secondary | ICD-10-CM | POA: Diagnosis not present

## 2016-08-23 DIAGNOSIS — M71572 Other bursitis, not elsewhere classified, left ankle and foot: Secondary | ICD-10-CM | POA: Diagnosis not present

## 2016-08-23 DIAGNOSIS — M65872 Other synovitis and tenosynovitis, left ankle and foot: Secondary | ICD-10-CM | POA: Diagnosis not present

## 2016-08-27 DIAGNOSIS — F411 Generalized anxiety disorder: Secondary | ICD-10-CM | POA: Diagnosis not present

## 2016-08-27 DIAGNOSIS — F3164 Bipolar disorder, current episode mixed, severe, with psychotic features: Secondary | ICD-10-CM | POA: Diagnosis not present

## 2016-08-27 DIAGNOSIS — Z5181 Encounter for therapeutic drug level monitoring: Secondary | ICD-10-CM | POA: Diagnosis not present

## 2016-08-27 DIAGNOSIS — F3132 Bipolar disorder, current episode depressed, moderate: Secondary | ICD-10-CM | POA: Diagnosis not present

## 2016-08-27 DIAGNOSIS — F250 Schizoaffective disorder, bipolar type: Secondary | ICD-10-CM | POA: Diagnosis not present

## 2016-08-27 DIAGNOSIS — F259 Schizoaffective disorder, unspecified: Secondary | ICD-10-CM | POA: Diagnosis not present

## 2016-08-30 DIAGNOSIS — F250 Schizoaffective disorder, bipolar type: Secondary | ICD-10-CM | POA: Diagnosis not present

## 2016-08-30 DIAGNOSIS — R4183 Borderline intellectual functioning: Secondary | ICD-10-CM | POA: Diagnosis not present

## 2016-09-12 DIAGNOSIS — B353 Tinea pedis: Secondary | ICD-10-CM | POA: Diagnosis not present

## 2016-09-12 DIAGNOSIS — E118 Type 2 diabetes mellitus with unspecified complications: Secondary | ICD-10-CM | POA: Diagnosis not present

## 2016-09-13 DIAGNOSIS — M65872 Other synovitis and tenosynovitis, left ankle and foot: Secondary | ICD-10-CM | POA: Diagnosis not present

## 2016-09-13 DIAGNOSIS — M25572 Pain in left ankle and joints of left foot: Secondary | ICD-10-CM | POA: Diagnosis not present

## 2016-09-25 ENCOUNTER — Other Ambulatory Visit (HOSPITAL_BASED_OUTPATIENT_CLINIC_OR_DEPARTMENT_OTHER): Payer: Self-pay

## 2016-09-25 DIAGNOSIS — R0683 Snoring: Secondary | ICD-10-CM

## 2016-09-25 DIAGNOSIS — G4730 Sleep apnea, unspecified: Secondary | ICD-10-CM

## 2016-09-28 ENCOUNTER — Ambulatory Visit (HOSPITAL_BASED_OUTPATIENT_CLINIC_OR_DEPARTMENT_OTHER): Payer: Medicare Other | Attending: Family Medicine | Admitting: Internal Medicine

## 2016-09-28 VITALS — Ht 72.0 in | Wt 280.0 lb

## 2016-09-28 DIAGNOSIS — G4733 Obstructive sleep apnea (adult) (pediatric): Secondary | ICD-10-CM | POA: Diagnosis not present

## 2016-09-28 DIAGNOSIS — G4730 Sleep apnea, unspecified: Secondary | ICD-10-CM

## 2016-09-28 DIAGNOSIS — R0902 Hypoxemia: Secondary | ICD-10-CM | POA: Insufficient documentation

## 2016-09-28 DIAGNOSIS — R0683 Snoring: Secondary | ICD-10-CM

## 2016-09-28 DIAGNOSIS — M65872 Other synovitis and tenosynovitis, left ankle and foot: Secondary | ICD-10-CM | POA: Diagnosis not present

## 2016-09-28 DIAGNOSIS — B353 Tinea pedis: Secondary | ICD-10-CM | POA: Diagnosis not present

## 2016-09-28 DIAGNOSIS — R4183 Borderline intellectual functioning: Secondary | ICD-10-CM | POA: Diagnosis not present

## 2016-09-28 DIAGNOSIS — F250 Schizoaffective disorder, bipolar type: Secondary | ICD-10-CM | POA: Diagnosis not present

## 2016-09-28 DIAGNOSIS — M12272 Villonodular synovitis (pigmented), left ankle and foot: Secondary | ICD-10-CM | POA: Diagnosis not present

## 2016-10-01 DIAGNOSIS — F411 Generalized anxiety disorder: Secondary | ICD-10-CM | POA: Diagnosis not present

## 2016-10-01 DIAGNOSIS — Z5181 Encounter for therapeutic drug level monitoring: Secondary | ICD-10-CM | POA: Diagnosis not present

## 2016-10-01 DIAGNOSIS — F250 Schizoaffective disorder, bipolar type: Secondary | ICD-10-CM | POA: Diagnosis not present

## 2016-10-01 DIAGNOSIS — F3164 Bipolar disorder, current episode mixed, severe, with psychotic features: Secondary | ICD-10-CM | POA: Diagnosis not present

## 2016-10-01 DIAGNOSIS — F3132 Bipolar disorder, current episode depressed, moderate: Secondary | ICD-10-CM | POA: Diagnosis not present

## 2016-10-01 DIAGNOSIS — F259 Schizoaffective disorder, unspecified: Secondary | ICD-10-CM | POA: Diagnosis not present

## 2016-10-04 DIAGNOSIS — R4183 Borderline intellectual functioning: Secondary | ICD-10-CM | POA: Diagnosis not present

## 2016-10-04 DIAGNOSIS — F250 Schizoaffective disorder, bipolar type: Secondary | ICD-10-CM | POA: Diagnosis not present

## 2016-10-09 DIAGNOSIS — G4733 Obstructive sleep apnea (adult) (pediatric): Secondary | ICD-10-CM

## 2016-10-09 NOTE — Procedures (Signed)
Patient Name: Cindy Phillips, Cindy Phillips Date: 09/28/2016 Gender: Female D.O.B: 01/09/1991 Age (years): 25 Referring Provider: Iona Beard Height (inches): 62 Interpreting Physician: Baird Lyons MD, ABSM Weight (lbs): 280 RPSGT: Jonna Coup BMI: 38 MRN: [id number] Neck Size: 16.00 CLINICAL INFORMATION Sleep Study Type: NPSG     Indication for sleep study: Diabetes, Excessive Daytime Sleepiness, Fatigue, Morning Headaches, Obesity, Snoring, Witnessed Apneas     Epworth Sleepiness Score: 8/24     SLEEP STUDY TECHNIQUE As per the AASM Manual for the Scoring of Sleep and Associated Events v2.3 (April 2016) with a hypopnea requiring 4% desaturations.  The channels recorded and monitored were frontal, central and occipital EEG, electrooculogram (EOG), submentalis EMG (chin), nasal and oral airflow, thoracic and abdominal wall motion, anterior tibialis EMG, snore microphone, electrocardiogram, and pulse oximetry.  MEDICATIONS Medications self-administered by patient taken the night of the study : olanzapine, hydroyzine, lamotrigine  SLEEP ARCHITECTURE The study was initiated at 9:58:35 PM and ended at 4:06:01 AM.  Sleep onset time was 6.9 minutes and the sleep efficiency was 96.1%. The total sleep time was 353.0 minutes.  Stage REM latency was 180.5 minutes.  The patient spent 1.13% of the night in stage N1 sleep, 88.67% in stage N2 sleep, 0.00% in stage N3 and 10.20% in REM.  Alpha intrusion was absent.  Supine sleep was 0.00%.  RESPIRATORY PARAMETERS The overall apnea/hypopnea index (AHI) was 30.9 per hour. There were 12 total apneas, including 12 obstructive, 0 central and 0 mixed apneas. There were 170 hypopneas and 3 RERAs.  The AHI during Stage REM sleep was 1.7 per hour.  AHI while supine was N/A per hour.  The mean oxygen saturation was 94.28%. The minimum SpO2 during sleep was 80.00%.  Loud snoring was noted during this study.  CARDIAC DATA The 2  lead EKG demonstrated sinus rhythm. The mean heart rate was 83.34 beats per minute. Other EKG findings include: None.  LEG MOVEMENT DATA The total PLMS were 8 with a resulting PLMS index of 1.36. Associated arousal with leg movement index was 1.5 .  IMPRESSIONS - Moderate obstructive sleep apnea occurred during this study (AHI = 30.9/h). - Patient refused to wear CPAP mask. - Patient wouldnot sleep on back due to back pain. - No significant central sleep apnea occurred during this study (CAI = 0.0/h). - Moderate oxygen desaturation was noted during this study (Min O2 = 80.00%). - The patient snored with Loud snoring volume. - No cardiac abnormalities were noted during this study. - Clinically significant periodic limb movements did not occur during sleep. No significant associated arousals.  DIAGNOSIS - Obstructive Sleep Apnea (327.23 [G47.33 ICD-10]) - Nocturnal Hypoxemia (327.26 [G47.36 ICD-10])  RECOMMENDATIONS - Therapeutic CPAP titration to include effort to desensitize the patient to CPAP. If she agrees to try CPAP, then suggest return for CPAP titration study to include CPAP desensitization. Alternatives might include ENT surgical consultation, or a fitted dental oral appliance. - Avoid alcohol, sedatives and other CNS depressants that may worsen sleep apnea and disrupt normal sleep architecture. - Sleep hygiene should be reviewed to assess factors that may improve sleep quality. - Weight management and regular exercise should be initiated or continued if appropriate.  [Electronically signed] 10/09/2016 03:09 PM  Baird Lyons MD, [place], American Board of Sleep Medicine   NPI: [id number]  [place], American Board of Sleep Medicine  ELECTRONICALLY SIGNED ON:  10/09/2016, 3:03 PM [place] PH: [phone]   FX: [phone] ACCREDITED BY  THE AMERICAN ACADEMY OF SLEEP MEDICINE

## 2016-10-11 DIAGNOSIS — R4183 Borderline intellectual functioning: Secondary | ICD-10-CM | POA: Diagnosis not present

## 2016-10-11 DIAGNOSIS — F250 Schizoaffective disorder, bipolar type: Secondary | ICD-10-CM | POA: Diagnosis not present

## 2016-10-12 ENCOUNTER — Encounter: Payer: Self-pay | Admitting: Physician Assistant

## 2016-10-12 ENCOUNTER — Ambulatory Visit (INDEPENDENT_AMBULATORY_CARE_PROVIDER_SITE_OTHER): Payer: Medicare Other | Admitting: Physician Assistant

## 2016-10-12 VITALS — BP 140/78 | HR 100 | Temp 98.3°F | Ht 72.0 in | Wt 280.6 lb

## 2016-10-12 DIAGNOSIS — N390 Urinary tract infection, site not specified: Secondary | ICD-10-CM

## 2016-10-12 DIAGNOSIS — N760 Acute vaginitis: Secondary | ICD-10-CM | POA: Diagnosis not present

## 2016-10-12 DIAGNOSIS — N898 Other specified noninflammatory disorders of vagina: Secondary | ICD-10-CM

## 2016-10-12 DIAGNOSIS — B9689 Other specified bacterial agents as the cause of diseases classified elsewhere: Secondary | ICD-10-CM

## 2016-10-12 DIAGNOSIS — R300 Dysuria: Secondary | ICD-10-CM | POA: Diagnosis not present

## 2016-10-12 DIAGNOSIS — R319 Hematuria, unspecified: Secondary | ICD-10-CM

## 2016-10-12 DIAGNOSIS — R350 Frequency of micturition: Secondary | ICD-10-CM

## 2016-10-12 LAB — POCT URINALYSIS DIP (MANUAL ENTRY)
Bilirubin, UA: NEGATIVE
Glucose, UA: NEGATIVE
Ketones, POC UA: NEGATIVE
Leukocytes, UA: NEGATIVE
Nitrite, UA: NEGATIVE
Protein Ur, POC: NEGATIVE
Spec Grav, UA: <=1.005
Urobilinogen, UA: 0.2
pH, UA: 6

## 2016-10-12 LAB — POCT WET + KOH PREP
Trich by wet prep: ABSENT
Yeast by KOH: ABSENT
Yeast by wet prep: ABSENT

## 2016-10-12 LAB — POC MICROSCOPIC URINALYSIS (UMFC): Mucus: ABSENT

## 2016-10-12 MED ORDER — METRONIDAZOLE 500 MG PO TABS: 500.0000 mg | ORAL_TABLET | Freq: Two times a day (BID) | ORAL | 0 refills | Status: DC

## 2016-10-12 NOTE — Patient Instructions (Addendum)
You have bacterial vaginosis. Please start taking Flagyl today. Pick this up at your pharmacy.  Read the directions and take this as directed.  Please drink at least 64 oz of water a day. Come back if you are not better in one week.   Thank you for coming in today. I hope you feel we met your needs.  Feel free to call [facility] if you have any questions or further requests.  Please consider signing up for MyChart if you do not already have it, as this is a great way to communicate with me.  Best,  Whitney McVey, PA-C   Bacterial Vaginosis Bacterial vaginosis is an infection of the vagina. It happens when too many germs (bacteria) grow in the vagina. This infection puts you at risk for infections from sex (STIs). Treating this infection can lower your risk for some STIs. You should also treat this if you are pregnant. It can cause your baby to be born early. Follow these instructions at home: Medicines  Take over-the-counter and prescription medicines only as told by your doctor.  Take or use your antibiotic medicine as told by your doctor. Do not stop taking or using it even if you start to feel better. General instructions  If you your sexual partner is a woman, tell her that you have this infection. She needs to get treatment if she has symptoms. If you have a female partner, he does not need to be treated.  During treatment:  Avoid sex.  Do not douche.  Avoid alcohol as told.  Avoid breastfeeding as told.  Drink enough fluid to keep your pee (urine) clear or pale yellow.  Keep your vagina and butt (rectum) clean.  Wash the area with warm water every day.  Wipe from front to back after you use the toilet.  Keep all follow-up visits as told by your doctor. This is important. Preventing this condition  Do not douche.  Use only warm water to wash around your vagina.  Use protection when you have sex. This includes:  Latex condoms.  Dental dams.  Limit how many people  you have sex with. It is best to only have sex with the same person (be monogamous).  Get tested for STIs. Have your partner get tested.  Wear underwear that is cotton or lined with cotton.  Avoid tight pants and pantyhose. This is most important in summer.  Do not use any products that have nicotine or tobacco in them. These include cigarettes and e-cigarettes. If you need help quitting, ask your doctor.  Do not use illegal drugs.  Limit how much alcohol you drink. Contact a doctor if:  Your symptoms do not get better, even after you are treated.  You have more discharge or pain when you pee (urinate).  You have a fever.  You have pain in your belly (abdomen).  You have pain with sex.  Your bleed from your vagina between periods. Summary  This infection happens when too many germs (bacteria) grow in the vagina.  Treating this condition can lower your risk for some infections from sex (STIs).  You should also treat this if you are pregnant. It can cause early (premature) birth.  Do not stop taking or using your antibiotic medicine even if you start to feel better. This information is not intended to replace advice given to you by your health care provider. Make sure you discuss any questions you have with your health care provider. Document Released: 05/15/2008 Document Revised:  04/21/2016 Document Reviewed: 04/21/2016 Elsevier Interactive Patient Education  2017 [employer].  IF you received an x-ray today, you will receive an invoice from [facility]. Please contact [facility] at [phone] with questions or concerns regarding your invoice.   IF you received labwork today, you will receive an invoice from [facility]. Please contact [facility] at [phone] with questions or concerns regarding your invoice.   Our billing staff will not be able to assist you with questions regarding bills from these companies.  You will be contacted with the lab  results as soon as they are available. The fastest way to get your results is to activate your My Chart account. Instructions are located on the last page of this paperwork. If you have not heard from [place] regarding the results in 2 weeks, please contact this office.

## 2016-10-12 NOTE — Progress Notes (Signed)
Cindy Phillips  MRN: [id number] DOB: [birth year]  PCP: Elyn Peers, MD  Subjective:  Pt is a 26 year old female PMH intellectual disability and schizoaffective disorder who presents to clinic for vaginal discharge.  +Burning with urination x three days. Dark urine, increased frequency, increased urgency, odor.  Denies vaginal discharge, itching, fever, chills, abdominal pain, back pain  Last coitus was last month. + condoms. She is on birth control.   Review of Systems  Constitutional: Negative for chills, fatigue and fever.  Respiratory: Negative for cough, shortness of breath and wheezing.   Gastrointestinal: Negative for abdominal pain, diarrhea, nausea and vomiting.  Genitourinary: Positive for dysuria, frequency and urgency. Negative for decreased urine volume, difficulty urinating, enuresis, flank pain, hematuria, menstrual problem, pelvic pain, vaginal discharge and vaginal pain.  Musculoskeletal: Negative for back pain.  Neurological: Negative for dizziness, weakness, light-headedness and headaches.    Patient Active Problem List   Diagnosis Date Noted  . Schizoaffective disorder, bipolar type ([place]) 09/14/2014  . Intellectual disability 09/10/2014    Current Outpatient Prescriptions on File Prior to Visit  Medication Sig Dispense Refill  . benztropine (COGENTIN) 0.5 MG tablet Take 1 tablet (0.5 mg total) by mouth at bedtime. For prevention of drug induced involuntary movements 30 tablet 0  . hydrOXYzine (ATARAX/VISTARIL) 50 MG tablet Take 1 tablet (50 mg total) by mouth every 4 (four) hours as needed for anxiety. 45 tablet 0  . LORazepam (ATIVAN) 0.5 MG tablet Take 1 tablet (0.5 mg total) by mouth every 6 (six) hours as needed (anxety / agitation). 7 tablet 0  . metFORMIN (GLUCOPHAGE) 500 MG tablet Take 500 mg by mouth 2 (two) times daily with a meal.    . OLANZapine zydis (ZYPREXA) 15 MG disintegrating tablet Take 1 tablet (15 mg total) by mouth at bedtime. For mood  control 30 tablet 0  . lamoTRIgine (LAMICTAL) 25 MG tablet Take 1 tablet (25 mg total) by mouth daily. For mood stabilization (Patient not taking: Reported on 07/24/2016) 30 tablet 0  . medroxyPROGESTERone (DEPO-PROVERA) 150 MG/ML injection Inject 1 mL (150 mg total) into the muscle every 3 (three) months. For prevention of pregnancy 1 mL   . nystatin-triamcinolone (MYCOLOG II) cream Apply to affected area daily (Patient not taking: Reported on 07/24/2016) 30 g 0  . sodium chloride (OCEAN) 0.65 % SOLN nasal spray Place 1 spray into both nostrils as needed for congestion. (Patient not taking: Reported on 07/24/2016)  0   No current facility-administered medications on file prior to visit.     No Known Allergies   Objective:  BP 140/78 (BP Location: Right Arm, Patient Position: Sitting, Cuff Size: Large)   Pulse 100   Temp 98.3 F (36.8 C) (Oral)   Ht 6' (1.829 m)   Wt 280 lb 9.6 oz (127.3 kg)   SpO2 97%   BMI 38.06 kg/m   Physical Exam  Constitutional: She is oriented to person, place, and time and well-developed, well-nourished, and in no distress. No distress.  Cardiovascular: Normal rate, regular rhythm and normal heart sounds.   Abdominal: There is no tenderness. There is no CVA tenderness.  Genitourinary: Cervix normal and vulva normal. Watery  white and vaginal discharge found.  Neurological: She is alert and oriented to person, place, and time. GCS score is 15.  Skin: Skin is warm and dry.  Psychiatric: Mood, memory, affect and judgment normal.  Vitals reviewed.  Results for orders placed or performed in visit on 10/12/16  POCT Microscopic  Urinalysis ([facility])  Result Value Ref Range   WBC,UR,HPF,POC None None WBC/hpf   RBC,UR,HPF,POC None None RBC/hpf   Bacteria Few (A) None, Too numerous to count   Mucus Absent Absent   Epithelial Cells, UR Per Microscopy Many (A) None, Too numerous to count cells/hpf  POCT urinalysis dipstick  Result Value Ref Range   Color, UA yellow  yellow   Clarity, UA clear clear   Glucose, UA negative negative   Bilirubin, UA negative negative   Ketones, POC UA negative negative   Spec Grav, UA <=1.005    Blood, UA trace-intact (A) negative   pH, UA 6.0    Protein Ur, POC negative negative   Urobilinogen, UA 0.2    Nitrite, UA Negative Negative   Leukocytes, UA Negative Negative  POCT Wet + KOH Prep  Result Value Ref Range   Yeast by KOH Absent Absent   Yeast by wet prep Absent Absent   WBC by wet prep None (A) Few   Clue Cells Wet Prep HPF POC Few (A) None   Trich by wet prep Absent Absent   Bacteria Wet Prep HPF POC Many (A) Few   Epithelial Cells By [employer] Pref ([facility]) Many (A) None, Few, Too numerous to count   RBC,UR,HPF,POC None None RBC/hpf    Assessment and Plan :  1. BV (bacterial vaginosis) 2. Urinary frequency 3. Burning with urination 4. Vaginal odor - Urine culture - POCT Microscopic Urinalysis ([facility]) - POCT urinalysis dipstick - GC/Chlamydia Probe Amp - POCT Wet + KOH Prep - metroNIDAZOLE (FLAGYL) 500 MG tablet; Take 1 tablet (500 mg total) by mouth 2 (two) times daily with a meal. DO NOT CONSUME ALCOHOL WHILE TAKING THIS MEDICATION.  Dispense: 14 tablet; Refill: 0 - KOH +clue cells. Will treat for BV Push fluids. RTC in 5-7 days if no improvement.   Mercer Pod, PA-C  [facility] [place] 10/12/2016 12:48 PM

## 2016-10-15 LAB — URINE CULTURE

## 2016-10-15 LAB — GC/CHLAMYDIA PROBE AMP
Chlamydia trachomatis, NAA: NEGATIVE
Neisseria gonorrhoeae by PCR: NEGATIVE

## 2016-10-15 MED ORDER — NITROFURANTOIN MONOHYD MACRO 100 MG PO CAPS: 100.0000 mg | ORAL_CAPSULE | Freq: Two times a day (BID) | ORAL | 0 refills | Status: DC

## 2016-10-15 NOTE — Addendum Note (Signed)
Addended by: Dorise Hiss on: 10/15/2016 08:35 AM   Modules accepted: Orders

## 2016-10-18 DIAGNOSIS — F250 Schizoaffective disorder, bipolar type: Secondary | ICD-10-CM | POA: Diagnosis not present

## 2016-10-18 DIAGNOSIS — R4183 Borderline intellectual functioning: Secondary | ICD-10-CM | POA: Diagnosis not present

## 2016-10-25 DIAGNOSIS — F250 Schizoaffective disorder, bipolar type: Secondary | ICD-10-CM | POA: Diagnosis not present

## 2016-10-25 DIAGNOSIS — R4183 Borderline intellectual functioning: Secondary | ICD-10-CM | POA: Diagnosis not present

## 2016-10-30 DIAGNOSIS — F411 Generalized anxiety disorder: Secondary | ICD-10-CM | POA: Diagnosis not present

## 2016-10-30 DIAGNOSIS — Z5181 Encounter for therapeutic drug level monitoring: Secondary | ICD-10-CM | POA: Diagnosis not present

## 2016-10-30 DIAGNOSIS — F3164 Bipolar disorder, current episode mixed, severe, with psychotic features: Secondary | ICD-10-CM | POA: Diagnosis not present

## 2016-10-30 DIAGNOSIS — F3132 Bipolar disorder, current episode depressed, moderate: Secondary | ICD-10-CM | POA: Diagnosis not present

## 2016-10-30 DIAGNOSIS — F259 Schizoaffective disorder, unspecified: Secondary | ICD-10-CM | POA: Diagnosis not present

## 2016-10-30 DIAGNOSIS — F250 Schizoaffective disorder, bipolar type: Secondary | ICD-10-CM | POA: Diagnosis not present

## 2016-11-01 DIAGNOSIS — F250 Schizoaffective disorder, bipolar type: Secondary | ICD-10-CM | POA: Diagnosis not present

## 2016-11-01 DIAGNOSIS — M65872 Other synovitis and tenosynovitis, left ankle and foot: Secondary | ICD-10-CM | POA: Diagnosis not present

## 2016-11-01 DIAGNOSIS — M12272 Villonodular synovitis (pigmented), left ankle and foot: Secondary | ICD-10-CM | POA: Diagnosis not present

## 2016-11-01 DIAGNOSIS — R4183 Borderline intellectual functioning: Secondary | ICD-10-CM | POA: Diagnosis not present

## 2016-11-08 DIAGNOSIS — Z7289 Other problems related to lifestyle: Secondary | ICD-10-CM | POA: Diagnosis not present

## 2016-11-08 DIAGNOSIS — R4183 Borderline intellectual functioning: Secondary | ICD-10-CM | POA: Diagnosis not present

## 2016-11-08 DIAGNOSIS — F250 Schizoaffective disorder, bipolar type: Secondary | ICD-10-CM | POA: Diagnosis not present

## 2016-11-15 DIAGNOSIS — F250 Schizoaffective disorder, bipolar type: Secondary | ICD-10-CM | POA: Diagnosis not present

## 2016-11-15 DIAGNOSIS — Z7289 Other problems related to lifestyle: Secondary | ICD-10-CM | POA: Diagnosis not present

## 2016-11-15 DIAGNOSIS — R4183 Borderline intellectual functioning: Secondary | ICD-10-CM | POA: Diagnosis not present

## 2016-11-22 DIAGNOSIS — F250 Schizoaffective disorder, bipolar type: Secondary | ICD-10-CM | POA: Diagnosis not present

## 2016-11-22 DIAGNOSIS — R4183 Borderline intellectual functioning: Secondary | ICD-10-CM | POA: Diagnosis not present

## 2016-12-05 DIAGNOSIS — E669 Obesity, unspecified: Secondary | ICD-10-CM | POA: Diagnosis not present

## 2016-12-05 DIAGNOSIS — B353 Tinea pedis: Secondary | ICD-10-CM | POA: Diagnosis not present

## 2016-12-05 DIAGNOSIS — E119 Type 2 diabetes mellitus without complications: Secondary | ICD-10-CM | POA: Diagnosis not present

## 2016-12-05 DIAGNOSIS — Z6839 Body mass index (BMI) 39.0-39.9, adult: Secondary | ICD-10-CM | POA: Diagnosis not present

## 2016-12-05 DIAGNOSIS — B359 Dermatophytosis, unspecified: Secondary | ICD-10-CM | POA: Diagnosis not present

## 2016-12-06 DIAGNOSIS — Z634 Disappearance and death of family member: Secondary | ICD-10-CM | POA: Diagnosis not present

## 2016-12-06 DIAGNOSIS — F4310 Post-traumatic stress disorder, unspecified: Secondary | ICD-10-CM | POA: Diagnosis not present

## 2016-12-07 DIAGNOSIS — M722 Plantar fascial fibromatosis: Secondary | ICD-10-CM | POA: Diagnosis not present

## 2016-12-07 DIAGNOSIS — M71571 Other bursitis, not elsewhere classified, right ankle and foot: Secondary | ICD-10-CM | POA: Diagnosis not present

## 2016-12-13 DIAGNOSIS — F250 Schizoaffective disorder, bipolar type: Secondary | ICD-10-CM | POA: Diagnosis not present

## 2016-12-13 DIAGNOSIS — R4183 Borderline intellectual functioning: Secondary | ICD-10-CM | POA: Diagnosis not present

## 2016-12-17 DIAGNOSIS — F259 Schizoaffective disorder, unspecified: Secondary | ICD-10-CM | POA: Diagnosis not present

## 2016-12-17 DIAGNOSIS — F411 Generalized anxiety disorder: Secondary | ICD-10-CM | POA: Diagnosis not present

## 2016-12-17 DIAGNOSIS — F3164 Bipolar disorder, current episode mixed, severe, with psychotic features: Secondary | ICD-10-CM | POA: Diagnosis not present

## 2016-12-17 DIAGNOSIS — F3132 Bipolar disorder, current episode depressed, moderate: Secondary | ICD-10-CM | POA: Diagnosis not present

## 2016-12-17 DIAGNOSIS — F250 Schizoaffective disorder, bipolar type: Secondary | ICD-10-CM | POA: Diagnosis not present

## 2016-12-17 DIAGNOSIS — Z5181 Encounter for therapeutic drug level monitoring: Secondary | ICD-10-CM | POA: Diagnosis not present

## 2016-12-17 DIAGNOSIS — R4183 Borderline intellectual functioning: Secondary | ICD-10-CM | POA: Diagnosis not present

## 2016-12-20 DIAGNOSIS — F250 Schizoaffective disorder, bipolar type: Secondary | ICD-10-CM | POA: Diagnosis not present

## 2016-12-20 DIAGNOSIS — R4183 Borderline intellectual functioning: Secondary | ICD-10-CM | POA: Diagnosis not present

## 2016-12-27 DIAGNOSIS — R4183 Borderline intellectual functioning: Secondary | ICD-10-CM | POA: Diagnosis not present

## 2016-12-27 DIAGNOSIS — F250 Schizoaffective disorder, bipolar type: Secondary | ICD-10-CM | POA: Diagnosis not present

## 2017-01-03 DIAGNOSIS — R4183 Borderline intellectual functioning: Secondary | ICD-10-CM | POA: Diagnosis not present

## 2017-01-03 DIAGNOSIS — F250 Schizoaffective disorder, bipolar type: Secondary | ICD-10-CM | POA: Diagnosis not present

## 2017-01-04 DIAGNOSIS — M7672 Peroneal tendinitis, left leg: Secondary | ICD-10-CM | POA: Diagnosis not present

## 2017-01-04 DIAGNOSIS — M12272 Villonodular synovitis (pigmented), left ankle and foot: Secondary | ICD-10-CM | POA: Diagnosis not present

## 2017-01-08 DIAGNOSIS — R4183 Borderline intellectual functioning: Secondary | ICD-10-CM | POA: Diagnosis not present

## 2017-01-08 DIAGNOSIS — Z6839 Body mass index (BMI) 39.0-39.9, adult: Secondary | ICD-10-CM | POA: Diagnosis not present

## 2017-01-08 DIAGNOSIS — S31809A Unspecified open wound of unspecified buttock, initial encounter: Secondary | ICD-10-CM | POA: Diagnosis not present

## 2017-01-08 DIAGNOSIS — G4700 Insomnia, unspecified: Secondary | ICD-10-CM | POA: Diagnosis not present

## 2017-01-08 DIAGNOSIS — E119 Type 2 diabetes mellitus without complications: Secondary | ICD-10-CM | POA: Diagnosis not present

## 2017-01-08 DIAGNOSIS — F250 Schizoaffective disorder, bipolar type: Secondary | ICD-10-CM | POA: Diagnosis not present

## 2017-01-21 ENCOUNTER — Other Ambulatory Visit (HOSPITAL_BASED_OUTPATIENT_CLINIC_OR_DEPARTMENT_OTHER): Payer: Self-pay

## 2017-01-21 DIAGNOSIS — G4730 Sleep apnea, unspecified: Secondary | ICD-10-CM

## 2017-01-24 DIAGNOSIS — F250 Schizoaffective disorder, bipolar type: Secondary | ICD-10-CM | POA: Diagnosis not present

## 2017-01-24 DIAGNOSIS — R4183 Borderline intellectual functioning: Secondary | ICD-10-CM | POA: Diagnosis not present

## 2017-01-28 DIAGNOSIS — M792 Neuralgia and neuritis, unspecified: Secondary | ICD-10-CM | POA: Diagnosis not present

## 2017-01-29 ENCOUNTER — Ambulatory Visit (HOSPITAL_BASED_OUTPATIENT_CLINIC_OR_DEPARTMENT_OTHER): Payer: Medicare Other | Attending: Family Medicine | Admitting: Radiology

## 2017-01-29 DIAGNOSIS — G4730 Sleep apnea, unspecified: Secondary | ICD-10-CM

## 2017-01-31 DIAGNOSIS — R4183 Borderline intellectual functioning: Secondary | ICD-10-CM | POA: Diagnosis not present

## 2017-01-31 DIAGNOSIS — F250 Schizoaffective disorder, bipolar type: Secondary | ICD-10-CM | POA: Diagnosis not present

## 2017-02-06 DIAGNOSIS — E119 Type 2 diabetes mellitus without complications: Secondary | ICD-10-CM | POA: Diagnosis not present

## 2017-02-07 DIAGNOSIS — F250 Schizoaffective disorder, bipolar type: Secondary | ICD-10-CM | POA: Diagnosis not present

## 2017-02-07 DIAGNOSIS — R4183 Borderline intellectual functioning: Secondary | ICD-10-CM | POA: Diagnosis not present

## 2017-02-14 DIAGNOSIS — R4183 Borderline intellectual functioning: Secondary | ICD-10-CM | POA: Diagnosis not present

## 2017-02-14 DIAGNOSIS — F250 Schizoaffective disorder, bipolar type: Secondary | ICD-10-CM | POA: Diagnosis not present

## 2017-02-18 DIAGNOSIS — M71571 Other bursitis, not elsewhere classified, right ankle and foot: Secondary | ICD-10-CM | POA: Diagnosis not present

## 2017-02-18 DIAGNOSIS — M722 Plantar fascial fibromatosis: Secondary | ICD-10-CM | POA: Diagnosis not present

## 2017-02-25 DIAGNOSIS — F3164 Bipolar disorder, current episode mixed, severe, with psychotic features: Secondary | ICD-10-CM | POA: Diagnosis not present

## 2017-02-25 DIAGNOSIS — Z5181 Encounter for therapeutic drug level monitoring: Secondary | ICD-10-CM | POA: Diagnosis not present

## 2017-02-25 DIAGNOSIS — F3132 Bipolar disorder, current episode depressed, moderate: Secondary | ICD-10-CM | POA: Diagnosis not present

## 2017-02-25 DIAGNOSIS — R4183 Borderline intellectual functioning: Secondary | ICD-10-CM | POA: Diagnosis not present

## 2017-02-25 DIAGNOSIS — F250 Schizoaffective disorder, bipolar type: Secondary | ICD-10-CM | POA: Diagnosis not present

## 2017-02-25 DIAGNOSIS — F259 Schizoaffective disorder, unspecified: Secondary | ICD-10-CM | POA: Diagnosis not present

## 2017-02-25 DIAGNOSIS — F411 Generalized anxiety disorder: Secondary | ICD-10-CM | POA: Diagnosis not present

## 2017-02-26 DIAGNOSIS — Z3042 Encounter for surveillance of injectable contraceptive: Secondary | ICD-10-CM | POA: Diagnosis not present

## 2017-03-07 DIAGNOSIS — F250 Schizoaffective disorder, bipolar type: Secondary | ICD-10-CM | POA: Diagnosis not present

## 2017-03-07 DIAGNOSIS — R4183 Borderline intellectual functioning: Secondary | ICD-10-CM | POA: Diagnosis not present

## 2017-03-11 DIAGNOSIS — M722 Plantar fascial fibromatosis: Secondary | ICD-10-CM | POA: Diagnosis not present

## 2017-03-11 DIAGNOSIS — M71571 Other bursitis, not elsewhere classified, right ankle and foot: Secondary | ICD-10-CM | POA: Diagnosis not present

## 2017-03-14 DIAGNOSIS — R4183 Borderline intellectual functioning: Secondary | ICD-10-CM | POA: Diagnosis not present

## 2017-03-14 DIAGNOSIS — F250 Schizoaffective disorder, bipolar type: Secondary | ICD-10-CM | POA: Diagnosis not present

## 2017-03-21 DIAGNOSIS — R4183 Borderline intellectual functioning: Secondary | ICD-10-CM | POA: Diagnosis not present

## 2017-03-21 DIAGNOSIS — F250 Schizoaffective disorder, bipolar type: Secondary | ICD-10-CM | POA: Diagnosis not present

## 2017-03-25 DIAGNOSIS — M722 Plantar fascial fibromatosis: Secondary | ICD-10-CM | POA: Diagnosis not present

## 2017-03-25 DIAGNOSIS — M7661 Achilles tendinitis, right leg: Secondary | ICD-10-CM | POA: Diagnosis not present

## 2017-03-28 DIAGNOSIS — R4183 Borderline intellectual functioning: Secondary | ICD-10-CM | POA: Diagnosis not present

## 2017-03-28 DIAGNOSIS — F250 Schizoaffective disorder, bipolar type: Secondary | ICD-10-CM | POA: Diagnosis not present

## 2017-04-01 DIAGNOSIS — F3164 Bipolar disorder, current episode mixed, severe, with psychotic features: Secondary | ICD-10-CM | POA: Diagnosis not present

## 2017-04-01 DIAGNOSIS — F3132 Bipolar disorder, current episode depressed, moderate: Secondary | ICD-10-CM | POA: Diagnosis not present

## 2017-04-01 DIAGNOSIS — Z5181 Encounter for therapeutic drug level monitoring: Secondary | ICD-10-CM | POA: Diagnosis not present

## 2017-04-01 DIAGNOSIS — F250 Schizoaffective disorder, bipolar type: Secondary | ICD-10-CM | POA: Diagnosis not present

## 2017-04-01 DIAGNOSIS — F411 Generalized anxiety disorder: Secondary | ICD-10-CM | POA: Diagnosis not present

## 2017-04-04 DIAGNOSIS — Z7289 Other problems related to lifestyle: Secondary | ICD-10-CM | POA: Diagnosis not present

## 2017-04-04 DIAGNOSIS — R4183 Borderline intellectual functioning: Secondary | ICD-10-CM | POA: Diagnosis not present

## 2017-04-04 DIAGNOSIS — F250 Schizoaffective disorder, bipolar type: Secondary | ICD-10-CM | POA: Diagnosis not present

## 2017-04-11 DIAGNOSIS — F250 Schizoaffective disorder, bipolar type: Secondary | ICD-10-CM | POA: Diagnosis not present

## 2017-04-11 DIAGNOSIS — R4183 Borderline intellectual functioning: Secondary | ICD-10-CM | POA: Diagnosis not present

## 2017-04-12 DIAGNOSIS — M24571 Contracture, right ankle: Secondary | ICD-10-CM | POA: Diagnosis not present

## 2017-04-12 DIAGNOSIS — E119 Type 2 diabetes mellitus without complications: Secondary | ICD-10-CM | POA: Diagnosis not present

## 2017-04-12 DIAGNOSIS — M7661 Achilles tendinitis, right leg: Secondary | ICD-10-CM | POA: Diagnosis not present

## 2017-04-12 DIAGNOSIS — Z79899 Other long term (current) drug therapy: Secondary | ICD-10-CM | POA: Diagnosis not present

## 2017-04-12 DIAGNOSIS — M722 Plantar fascial fibromatosis: Secondary | ICD-10-CM | POA: Diagnosis not present

## 2017-04-12 DIAGNOSIS — Z01812 Encounter for preprocedural laboratory examination: Secondary | ICD-10-CM | POA: Diagnosis not present

## 2017-04-18 DIAGNOSIS — R4183 Borderline intellectual functioning: Secondary | ICD-10-CM | POA: Diagnosis not present

## 2017-04-18 DIAGNOSIS — F250 Schizoaffective disorder, bipolar type: Secondary | ICD-10-CM | POA: Diagnosis not present

## 2017-04-18 DIAGNOSIS — Z7289 Other problems related to lifestyle: Secondary | ICD-10-CM | POA: Diagnosis not present

## 2017-04-30 DIAGNOSIS — M24571 Contracture, right ankle: Secondary | ICD-10-CM | POA: Diagnosis not present

## 2017-04-30 DIAGNOSIS — M7661 Achilles tendinitis, right leg: Secondary | ICD-10-CM | POA: Diagnosis not present

## 2017-04-30 DIAGNOSIS — M722 Plantar fascial fibromatosis: Secondary | ICD-10-CM | POA: Diagnosis not present

## 2017-05-02 DIAGNOSIS — M722 Plantar fascial fibromatosis: Secondary | ICD-10-CM | POA: Diagnosis not present

## 2017-05-02 DIAGNOSIS — M7661 Achilles tendinitis, right leg: Secondary | ICD-10-CM | POA: Diagnosis not present

## 2017-05-09 DIAGNOSIS — R4183 Borderline intellectual functioning: Secondary | ICD-10-CM | POA: Diagnosis not present

## 2017-05-09 DIAGNOSIS — F250 Schizoaffective disorder, bipolar type: Secondary | ICD-10-CM | POA: Diagnosis not present

## 2017-05-13 DIAGNOSIS — E118 Type 2 diabetes mellitus with unspecified complications: Secondary | ICD-10-CM | POA: Diagnosis not present

## 2017-05-13 DIAGNOSIS — M25511 Pain in right shoulder: Secondary | ICD-10-CM | POA: Diagnosis not present

## 2017-05-13 DIAGNOSIS — Z23 Encounter for immunization: Secondary | ICD-10-CM | POA: Diagnosis not present

## 2017-05-15 DIAGNOSIS — M722 Plantar fascial fibromatosis: Secondary | ICD-10-CM | POA: Diagnosis not present

## 2017-05-15 DIAGNOSIS — M7661 Achilles tendinitis, right leg: Secondary | ICD-10-CM | POA: Diagnosis not present

## 2017-05-15 DIAGNOSIS — M7751 Other enthesopathy of right foot: Secondary | ICD-10-CM | POA: Diagnosis not present

## 2017-05-16 DIAGNOSIS — R4183 Borderline intellectual functioning: Secondary | ICD-10-CM | POA: Diagnosis not present

## 2017-05-16 DIAGNOSIS — F250 Schizoaffective disorder, bipolar type: Secondary | ICD-10-CM | POA: Diagnosis not present

## 2017-05-29 DIAGNOSIS — M722 Plantar fascial fibromatosis: Secondary | ICD-10-CM | POA: Diagnosis not present

## 2017-06-06 DIAGNOSIS — R262 Difficulty in walking, not elsewhere classified: Secondary | ICD-10-CM | POA: Diagnosis not present

## 2017-06-06 DIAGNOSIS — M7661 Achilles tendinitis, right leg: Secondary | ICD-10-CM | POA: Diagnosis not present

## 2017-06-06 DIAGNOSIS — M79671 Pain in right foot: Secondary | ICD-10-CM | POA: Diagnosis not present

## 2017-06-06 DIAGNOSIS — M799 Soft tissue disorder, unspecified: Secondary | ICD-10-CM | POA: Diagnosis not present

## 2017-06-10 DIAGNOSIS — M7661 Achilles tendinitis, right leg: Secondary | ICD-10-CM | POA: Diagnosis not present

## 2017-06-10 DIAGNOSIS — M799 Soft tissue disorder, unspecified: Secondary | ICD-10-CM | POA: Diagnosis not present

## 2017-06-10 DIAGNOSIS — R262 Difficulty in walking, not elsewhere classified: Secondary | ICD-10-CM | POA: Diagnosis not present

## 2017-06-10 DIAGNOSIS — M79671 Pain in right foot: Secondary | ICD-10-CM | POA: Diagnosis not present

## 2017-06-11 DIAGNOSIS — M7661 Achilles tendinitis, right leg: Secondary | ICD-10-CM | POA: Diagnosis not present

## 2017-06-11 DIAGNOSIS — M79671 Pain in right foot: Secondary | ICD-10-CM | POA: Diagnosis not present

## 2017-06-11 DIAGNOSIS — M799 Soft tissue disorder, unspecified: Secondary | ICD-10-CM | POA: Diagnosis not present

## 2017-06-11 DIAGNOSIS — R262 Difficulty in walking, not elsewhere classified: Secondary | ICD-10-CM | POA: Diagnosis not present

## 2017-06-14 DIAGNOSIS — R262 Difficulty in walking, not elsewhere classified: Secondary | ICD-10-CM | POA: Diagnosis not present

## 2017-06-14 DIAGNOSIS — M79671 Pain in right foot: Secondary | ICD-10-CM | POA: Diagnosis not present

## 2017-06-14 DIAGNOSIS — M7661 Achilles tendinitis, right leg: Secondary | ICD-10-CM | POA: Diagnosis not present

## 2017-06-14 DIAGNOSIS — M799 Soft tissue disorder, unspecified: Secondary | ICD-10-CM | POA: Diagnosis not present

## 2017-06-17 DIAGNOSIS — M79671 Pain in right foot: Secondary | ICD-10-CM | POA: Diagnosis not present

## 2017-06-17 DIAGNOSIS — R262 Difficulty in walking, not elsewhere classified: Secondary | ICD-10-CM | POA: Diagnosis not present

## 2017-06-17 DIAGNOSIS — M7661 Achilles tendinitis, right leg: Secondary | ICD-10-CM | POA: Diagnosis not present

## 2017-06-17 DIAGNOSIS — M799 Soft tissue disorder, unspecified: Secondary | ICD-10-CM | POA: Diagnosis not present

## 2017-06-18 DIAGNOSIS — R262 Difficulty in walking, not elsewhere classified: Secondary | ICD-10-CM | POA: Diagnosis not present

## 2017-06-18 DIAGNOSIS — M79671 Pain in right foot: Secondary | ICD-10-CM | POA: Diagnosis not present

## 2017-06-18 DIAGNOSIS — M7661 Achilles tendinitis, right leg: Secondary | ICD-10-CM | POA: Diagnosis not present

## 2017-06-18 DIAGNOSIS — M799 Soft tissue disorder, unspecified: Secondary | ICD-10-CM | POA: Diagnosis not present

## 2017-06-19 DIAGNOSIS — M79671 Pain in right foot: Secondary | ICD-10-CM | POA: Diagnosis not present

## 2017-06-19 DIAGNOSIS — R262 Difficulty in walking, not elsewhere classified: Secondary | ICD-10-CM | POA: Diagnosis not present

## 2017-06-19 DIAGNOSIS — M799 Soft tissue disorder, unspecified: Secondary | ICD-10-CM | POA: Diagnosis not present

## 2017-06-19 DIAGNOSIS — M7661 Achilles tendinitis, right leg: Secondary | ICD-10-CM | POA: Diagnosis not present

## 2017-06-24 DIAGNOSIS — M79671 Pain in right foot: Secondary | ICD-10-CM | POA: Diagnosis not present

## 2017-06-24 DIAGNOSIS — M7661 Achilles tendinitis, right leg: Secondary | ICD-10-CM | POA: Diagnosis not present

## 2017-06-24 DIAGNOSIS — R262 Difficulty in walking, not elsewhere classified: Secondary | ICD-10-CM | POA: Diagnosis not present

## 2017-06-24 DIAGNOSIS — M799 Soft tissue disorder, unspecified: Secondary | ICD-10-CM | POA: Diagnosis not present

## 2017-06-25 DIAGNOSIS — R262 Difficulty in walking, not elsewhere classified: Secondary | ICD-10-CM | POA: Diagnosis not present

## 2017-06-25 DIAGNOSIS — M799 Soft tissue disorder, unspecified: Secondary | ICD-10-CM | POA: Diagnosis not present

## 2017-06-25 DIAGNOSIS — M79671 Pain in right foot: Secondary | ICD-10-CM | POA: Diagnosis not present

## 2017-06-25 DIAGNOSIS — M7661 Achilles tendinitis, right leg: Secondary | ICD-10-CM | POA: Diagnosis not present

## 2017-06-26 DIAGNOSIS — M7661 Achilles tendinitis, right leg: Secondary | ICD-10-CM | POA: Diagnosis not present

## 2017-06-28 DIAGNOSIS — R262 Difficulty in walking, not elsewhere classified: Secondary | ICD-10-CM | POA: Diagnosis not present

## 2017-06-28 DIAGNOSIS — M799 Soft tissue disorder, unspecified: Secondary | ICD-10-CM | POA: Diagnosis not present

## 2017-06-28 DIAGNOSIS — M79671 Pain in right foot: Secondary | ICD-10-CM | POA: Diagnosis not present

## 2017-06-28 DIAGNOSIS — M7661 Achilles tendinitis, right leg: Secondary | ICD-10-CM | POA: Diagnosis not present

## 2017-07-01 DIAGNOSIS — E119 Type 2 diabetes mellitus without complications: Secondary | ICD-10-CM | POA: Diagnosis not present

## 2017-07-02 DIAGNOSIS — M79671 Pain in right foot: Secondary | ICD-10-CM | POA: Diagnosis not present

## 2017-07-02 DIAGNOSIS — R262 Difficulty in walking, not elsewhere classified: Secondary | ICD-10-CM | POA: Diagnosis not present

## 2017-07-02 DIAGNOSIS — M799 Soft tissue disorder, unspecified: Secondary | ICD-10-CM | POA: Diagnosis not present

## 2017-07-02 DIAGNOSIS — M7661 Achilles tendinitis, right leg: Secondary | ICD-10-CM | POA: Diagnosis not present

## 2017-07-04 DIAGNOSIS — M79671 Pain in right foot: Secondary | ICD-10-CM | POA: Diagnosis not present

## 2017-07-04 DIAGNOSIS — M799 Soft tissue disorder, unspecified: Secondary | ICD-10-CM | POA: Diagnosis not present

## 2017-07-04 DIAGNOSIS — M7661 Achilles tendinitis, right leg: Secondary | ICD-10-CM | POA: Diagnosis not present

## 2017-07-04 DIAGNOSIS — R262 Difficulty in walking, not elsewhere classified: Secondary | ICD-10-CM | POA: Diagnosis not present

## 2017-07-08 DIAGNOSIS — R262 Difficulty in walking, not elsewhere classified: Secondary | ICD-10-CM | POA: Diagnosis not present

## 2017-07-08 DIAGNOSIS — M79671 Pain in right foot: Secondary | ICD-10-CM | POA: Diagnosis not present

## 2017-07-08 DIAGNOSIS — M7661 Achilles tendinitis, right leg: Secondary | ICD-10-CM | POA: Diagnosis not present

## 2017-07-08 DIAGNOSIS — M799 Soft tissue disorder, unspecified: Secondary | ICD-10-CM | POA: Diagnosis not present

## 2017-07-09 DIAGNOSIS — M79671 Pain in right foot: Secondary | ICD-10-CM | POA: Diagnosis not present

## 2017-07-09 DIAGNOSIS — M7661 Achilles tendinitis, right leg: Secondary | ICD-10-CM | POA: Diagnosis not present

## 2017-07-09 DIAGNOSIS — M799 Soft tissue disorder, unspecified: Secondary | ICD-10-CM | POA: Diagnosis not present

## 2017-07-09 DIAGNOSIS — R262 Difficulty in walking, not elsewhere classified: Secondary | ICD-10-CM | POA: Diagnosis not present

## 2017-07-15 DIAGNOSIS — M7661 Achilles tendinitis, right leg: Secondary | ICD-10-CM | POA: Diagnosis not present

## 2017-07-15 DIAGNOSIS — K5900 Constipation, unspecified: Secondary | ICD-10-CM | POA: Diagnosis not present

## 2017-07-15 DIAGNOSIS — R262 Difficulty in walking, not elsewhere classified: Secondary | ICD-10-CM | POA: Diagnosis not present

## 2017-07-15 DIAGNOSIS — R040 Epistaxis: Secondary | ICD-10-CM | POA: Diagnosis not present

## 2017-07-15 DIAGNOSIS — E119 Type 2 diabetes mellitus without complications: Secondary | ICD-10-CM | POA: Diagnosis not present

## 2017-07-15 DIAGNOSIS — E1165 Type 2 diabetes mellitus with hyperglycemia: Secondary | ICD-10-CM | POA: Diagnosis not present

## 2017-07-15 DIAGNOSIS — M799 Soft tissue disorder, unspecified: Secondary | ICD-10-CM | POA: Diagnosis not present

## 2017-07-15 DIAGNOSIS — M79671 Pain in right foot: Secondary | ICD-10-CM | POA: Diagnosis not present

## 2017-07-15 DIAGNOSIS — E118 Type 2 diabetes mellitus with unspecified complications: Secondary | ICD-10-CM | POA: Diagnosis not present

## 2017-07-17 DIAGNOSIS — R262 Difficulty in walking, not elsewhere classified: Secondary | ICD-10-CM | POA: Diagnosis not present

## 2017-07-17 DIAGNOSIS — M7661 Achilles tendinitis, right leg: Secondary | ICD-10-CM | POA: Diagnosis not present

## 2017-07-17 DIAGNOSIS — M79671 Pain in right foot: Secondary | ICD-10-CM | POA: Diagnosis not present

## 2017-07-17 DIAGNOSIS — M799 Soft tissue disorder, unspecified: Secondary | ICD-10-CM | POA: Diagnosis not present

## 2017-07-22 DIAGNOSIS — M7661 Achilles tendinitis, right leg: Secondary | ICD-10-CM | POA: Diagnosis not present

## 2017-07-22 DIAGNOSIS — R262 Difficulty in walking, not elsewhere classified: Secondary | ICD-10-CM | POA: Diagnosis not present

## 2017-07-22 DIAGNOSIS — M799 Soft tissue disorder, unspecified: Secondary | ICD-10-CM | POA: Diagnosis not present

## 2017-07-22 DIAGNOSIS — M79671 Pain in right foot: Secondary | ICD-10-CM | POA: Diagnosis not present

## 2017-07-23 DIAGNOSIS — R262 Difficulty in walking, not elsewhere classified: Secondary | ICD-10-CM | POA: Diagnosis not present

## 2017-07-23 DIAGNOSIS — M7661 Achilles tendinitis, right leg: Secondary | ICD-10-CM | POA: Diagnosis not present

## 2017-07-23 DIAGNOSIS — M799 Soft tissue disorder, unspecified: Secondary | ICD-10-CM | POA: Diagnosis not present

## 2017-07-23 DIAGNOSIS — M79671 Pain in right foot: Secondary | ICD-10-CM | POA: Diagnosis not present

## 2017-07-24 DIAGNOSIS — R262 Difficulty in walking, not elsewhere classified: Secondary | ICD-10-CM | POA: Diagnosis not present

## 2017-07-24 DIAGNOSIS — M79671 Pain in right foot: Secondary | ICD-10-CM | POA: Diagnosis not present

## 2017-07-24 DIAGNOSIS — M7661 Achilles tendinitis, right leg: Secondary | ICD-10-CM | POA: Diagnosis not present

## 2017-07-24 DIAGNOSIS — M799 Soft tissue disorder, unspecified: Secondary | ICD-10-CM | POA: Diagnosis not present

## 2017-07-31 DIAGNOSIS — M79671 Pain in right foot: Secondary | ICD-10-CM | POA: Diagnosis not present

## 2017-07-31 DIAGNOSIS — M799 Soft tissue disorder, unspecified: Secondary | ICD-10-CM | POA: Diagnosis not present

## 2017-07-31 DIAGNOSIS — M7661 Achilles tendinitis, right leg: Secondary | ICD-10-CM | POA: Diagnosis not present

## 2017-07-31 DIAGNOSIS — R262 Difficulty in walking, not elsewhere classified: Secondary | ICD-10-CM | POA: Diagnosis not present

## 2017-08-06 DIAGNOSIS — R262 Difficulty in walking, not elsewhere classified: Secondary | ICD-10-CM | POA: Diagnosis not present

## 2017-08-06 DIAGNOSIS — M79671 Pain in right foot: Secondary | ICD-10-CM | POA: Diagnosis not present

## 2017-08-06 DIAGNOSIS — M7661 Achilles tendinitis, right leg: Secondary | ICD-10-CM | POA: Diagnosis not present

## 2017-08-06 DIAGNOSIS — M799 Soft tissue disorder, unspecified: Secondary | ICD-10-CM | POA: Diagnosis not present

## 2017-08-07 DIAGNOSIS — M7989 Other specified soft tissue disorders: Secondary | ICD-10-CM | POA: Diagnosis not present

## 2017-08-07 DIAGNOSIS — R262 Difficulty in walking, not elsewhere classified: Secondary | ICD-10-CM | POA: Diagnosis not present

## 2017-08-07 DIAGNOSIS — M79671 Pain in right foot: Secondary | ICD-10-CM | POA: Diagnosis not present

## 2017-08-07 DIAGNOSIS — M799 Soft tissue disorder, unspecified: Secondary | ICD-10-CM | POA: Diagnosis not present

## 2017-08-07 DIAGNOSIS — M792 Neuralgia and neuritis, unspecified: Secondary | ICD-10-CM | POA: Diagnosis not present

## 2017-08-07 DIAGNOSIS — M7661 Achilles tendinitis, right leg: Secondary | ICD-10-CM | POA: Diagnosis not present

## 2017-08-12 DIAGNOSIS — E118 Type 2 diabetes mellitus with unspecified complications: Secondary | ICD-10-CM | POA: Diagnosis not present

## 2017-08-15 DIAGNOSIS — R262 Difficulty in walking, not elsewhere classified: Secondary | ICD-10-CM | POA: Diagnosis not present

## 2017-08-15 DIAGNOSIS — M79671 Pain in right foot: Secondary | ICD-10-CM | POA: Diagnosis not present

## 2017-08-15 DIAGNOSIS — M799 Soft tissue disorder, unspecified: Secondary | ICD-10-CM | POA: Diagnosis not present

## 2017-08-15 DIAGNOSIS — M7661 Achilles tendinitis, right leg: Secondary | ICD-10-CM | POA: Diagnosis not present

## 2017-08-19 DIAGNOSIS — M7661 Achilles tendinitis, right leg: Secondary | ICD-10-CM | POA: Diagnosis not present

## 2017-08-19 DIAGNOSIS — M79671 Pain in right foot: Secondary | ICD-10-CM | POA: Diagnosis not present

## 2017-08-19 DIAGNOSIS — M799 Soft tissue disorder, unspecified: Secondary | ICD-10-CM | POA: Diagnosis not present

## 2017-08-19 DIAGNOSIS — R262 Difficulty in walking, not elsewhere classified: Secondary | ICD-10-CM | POA: Diagnosis not present

## 2017-08-21 DIAGNOSIS — M7661 Achilles tendinitis, right leg: Secondary | ICD-10-CM | POA: Diagnosis not present

## 2017-08-21 DIAGNOSIS — M799 Soft tissue disorder, unspecified: Secondary | ICD-10-CM | POA: Diagnosis not present

## 2017-08-21 DIAGNOSIS — M79671 Pain in right foot: Secondary | ICD-10-CM | POA: Diagnosis not present

## 2017-08-21 DIAGNOSIS — R262 Difficulty in walking, not elsewhere classified: Secondary | ICD-10-CM | POA: Diagnosis not present

## 2017-08-22 DIAGNOSIS — M7661 Achilles tendinitis, right leg: Secondary | ICD-10-CM | POA: Diagnosis not present

## 2017-08-22 DIAGNOSIS — R262 Difficulty in walking, not elsewhere classified: Secondary | ICD-10-CM | POA: Diagnosis not present

## 2017-08-22 DIAGNOSIS — M799 Soft tissue disorder, unspecified: Secondary | ICD-10-CM | POA: Diagnosis not present

## 2017-08-22 DIAGNOSIS — M79671 Pain in right foot: Secondary | ICD-10-CM | POA: Diagnosis not present

## 2017-08-27 DIAGNOSIS — M7661 Achilles tendinitis, right leg: Secondary | ICD-10-CM | POA: Diagnosis not present

## 2017-08-27 DIAGNOSIS — M799 Soft tissue disorder, unspecified: Secondary | ICD-10-CM | POA: Diagnosis not present

## 2017-08-27 DIAGNOSIS — R262 Difficulty in walking, not elsewhere classified: Secondary | ICD-10-CM | POA: Diagnosis not present

## 2017-08-27 DIAGNOSIS — M79671 Pain in right foot: Secondary | ICD-10-CM | POA: Diagnosis not present

## 2017-08-29 DIAGNOSIS — M7661 Achilles tendinitis, right leg: Secondary | ICD-10-CM | POA: Diagnosis not present

## 2017-08-29 DIAGNOSIS — R262 Difficulty in walking, not elsewhere classified: Secondary | ICD-10-CM | POA: Diagnosis not present

## 2017-08-29 DIAGNOSIS — M79671 Pain in right foot: Secondary | ICD-10-CM | POA: Diagnosis not present

## 2017-08-29 DIAGNOSIS — M799 Soft tissue disorder, unspecified: Secondary | ICD-10-CM | POA: Diagnosis not present

## 2017-09-02 DIAGNOSIS — M7661 Achilles tendinitis, right leg: Secondary | ICD-10-CM | POA: Diagnosis not present

## 2017-09-02 DIAGNOSIS — R262 Difficulty in walking, not elsewhere classified: Secondary | ICD-10-CM | POA: Diagnosis not present

## 2017-09-02 DIAGNOSIS — M79671 Pain in right foot: Secondary | ICD-10-CM | POA: Diagnosis not present

## 2017-09-02 DIAGNOSIS — M799 Soft tissue disorder, unspecified: Secondary | ICD-10-CM | POA: Diagnosis not present

## 2017-09-04 DIAGNOSIS — M79671 Pain in right foot: Secondary | ICD-10-CM | POA: Diagnosis not present

## 2017-09-04 DIAGNOSIS — R262 Difficulty in walking, not elsewhere classified: Secondary | ICD-10-CM | POA: Diagnosis not present

## 2017-09-04 DIAGNOSIS — M7661 Achilles tendinitis, right leg: Secondary | ICD-10-CM | POA: Diagnosis not present

## 2017-09-04 DIAGNOSIS — M799 Soft tissue disorder, unspecified: Secondary | ICD-10-CM | POA: Diagnosis not present

## 2017-09-10 DIAGNOSIS — M79671 Pain in right foot: Secondary | ICD-10-CM | POA: Diagnosis not present

## 2017-09-10 DIAGNOSIS — M799 Soft tissue disorder, unspecified: Secondary | ICD-10-CM | POA: Diagnosis not present

## 2017-09-10 DIAGNOSIS — R262 Difficulty in walking, not elsewhere classified: Secondary | ICD-10-CM | POA: Diagnosis not present

## 2017-09-10 DIAGNOSIS — M7661 Achilles tendinitis, right leg: Secondary | ICD-10-CM | POA: Diagnosis not present

## 2017-09-11 DIAGNOSIS — M7661 Achilles tendinitis, right leg: Secondary | ICD-10-CM | POA: Diagnosis not present

## 2017-09-11 DIAGNOSIS — M799 Soft tissue disorder, unspecified: Secondary | ICD-10-CM | POA: Diagnosis not present

## 2017-09-11 DIAGNOSIS — R262 Difficulty in walking, not elsewhere classified: Secondary | ICD-10-CM | POA: Diagnosis not present

## 2017-09-11 DIAGNOSIS — M79671 Pain in right foot: Secondary | ICD-10-CM | POA: Diagnosis not present

## 2017-11-05 ENCOUNTER — Encounter (INDEPENDENT_AMBULATORY_CARE_PROVIDER_SITE_OTHER): Payer: Medicare Other

## 2017-11-11 DIAGNOSIS — E1165 Type 2 diabetes mellitus with hyperglycemia: Secondary | ICD-10-CM | POA: Diagnosis not present

## 2017-11-11 DIAGNOSIS — I10 Essential (primary) hypertension: Secondary | ICD-10-CM | POA: Diagnosis not present

## 2017-11-11 DIAGNOSIS — E119 Type 2 diabetes mellitus without complications: Secondary | ICD-10-CM | POA: Diagnosis not present

## 2017-11-11 DIAGNOSIS — E785 Hyperlipidemia, unspecified: Secondary | ICD-10-CM | POA: Diagnosis not present

## 2017-11-11 DIAGNOSIS — R040 Epistaxis: Secondary | ICD-10-CM | POA: Diagnosis not present

## 2017-11-26 ENCOUNTER — Ambulatory Visit (INDEPENDENT_AMBULATORY_CARE_PROVIDER_SITE_OTHER): Payer: Medicare Other | Admitting: Family Medicine

## 2017-11-26 ENCOUNTER — Encounter (INDEPENDENT_AMBULATORY_CARE_PROVIDER_SITE_OTHER): Payer: Self-pay | Admitting: Family Medicine

## 2017-11-26 VITALS — BP 115/71 | HR 87 | Temp 98.0°F | Ht 71.0 in | Wt 289.0 lb

## 2017-11-26 DIAGNOSIS — E559 Vitamin D deficiency, unspecified: Secondary | ICD-10-CM

## 2017-11-26 DIAGNOSIS — R5383 Other fatigue: Secondary | ICD-10-CM

## 2017-11-26 DIAGNOSIS — Z0289 Encounter for other administrative examinations: Secondary | ICD-10-CM

## 2017-11-26 DIAGNOSIS — R0602 Shortness of breath: Secondary | ICD-10-CM

## 2017-11-26 DIAGNOSIS — Z1331 Encounter for screening for depression: Secondary | ICD-10-CM

## 2017-11-26 DIAGNOSIS — Z6841 Body Mass Index (BMI) 40.0 and over, adult: Secondary | ICD-10-CM

## 2017-11-26 DIAGNOSIS — E119 Type 2 diabetes mellitus without complications: Secondary | ICD-10-CM | POA: Diagnosis not present

## 2017-11-26 DIAGNOSIS — E7849 Other hyperlipidemia: Secondary | ICD-10-CM

## 2017-11-26 NOTE — Progress Notes (Signed)
. .  Office: [phone]  /  Fax: [phone]   HPI:   Chief Complaint: [place] (MR# [id number]) is a 27 y.o. female who presents on 11/26/2017 for obesity evaluation and treatment. Current BMI is Body mass index is 40.31 kg/m.Cindy Phillips has struggled with obesity for years and has been unsuccessful in either losing weight or maintaining long term weight loss. Cindy Phillips has an intellectual disability and schizoaffective disorder. She is here by herself, but she lives with her mother and grandmother. She had help filling out her paperwork. Cindy Phillips attended our information session and states she is currently in the action stage of change and ready to dedicate time achieving and maintaining a healthier weight.  Cindy Phillips states her family eats meals together she thinks her family will eat healthier with  her her desired weight loss is 130 lbs she started gaining weight when she got diabetes her heaviest weight ever was 280 lbs. she is a picky eater and doesn't like to eat healthier foods  she has significant food cravings issues  she is frequently drinking liquids with calories she frequently makes poor food choices she struggles with emotional eating    Cindy Phillips feels her energy is lower than it should be. This has worsened with weight gain and has not worsened recently. Cindy Phillips admits to daytime somnolence and admits to waking up still tired. Patient is at risk for obstructive sleep apnea. Patent has a history of symptoms of daytime Cindy and morning Cindy. Patient generally gets 8 hours of sleep per night, and states they generally have restful sleep. Snoring is present. Apneic episodes are present. Epworth Sleepiness Score is 9  Dyspnea on exertion Cindy Phillips notes increasing shortness of breath with exercising and seems to be worsening over time with weight gain. She notes getting out of breath sooner with activity than she used to. This has not gotten worse  recently. Cindy Phillips denies orthopnea.  Vitamin D deficiency Cindy Phillips has a diagnosis of vitamin D deficiency. Cindy Phillips is not currently taking vit D and she admits Cindy but denies nausea, vomiting or muscle weakness.  Hyperlipidemia Cindy Phillips has hyperlipidemia and is on statin. She is attempting to improve her cholesterol levels with intensive lifestyle modification including a low saturated fat diet, exercise and weight loss. She denies any chest pain or myalgias.  Diabetes II Cindy Phillips has a diagnosis of diabetes type II. Cindy Phillips states fasting BGs range between 92 and 151, 2 hour post prandial BGs  range between 83 and 165 on metformin. Cindy Phillips denies any hypoglycemic episodes, nausea or vomiting. There is no recent A1c. She is attempting to work on intensive lifestyle modifications including diet, exercise, and weight loss to help control her blood glucose levels.  Depression Screen Cindy Phillips's Food and Mood (modified PHQ-9) score was  Depression screen PHQ 2/9 11/26/2017  Decreased Interest 0  Down, Depressed, Hopeless 2  PHQ - 2 Score 2  Altered sleeping 3  Tired, decreased energy 3  Change in appetite 2  Feeling bad or failure about yourself  0  Trouble concentrating 3  Moving slowly or fidgety/restless 2  Suicidal thoughts 3  PHQ-9 Score 18  Difficult doing work/chores Very difficult    ALLERGIES: No Known Allergies  MEDICATIONS: Current Outpatient Medications on File Prior to Visit  Medication Sig Dispense Refill  . atorvastatin (LIPITOR) 10 MG tablet Take 10 mg by mouth daily.    . hydrOXYzine (ATARAX/VISTARIL) 50 MG tablet Take 1 tablet (50 mg total) by mouth every 4 (  four) hours as needed for anxiety. 45 tablet 0  . lamoTRIgine (LAMICTAL) 25 MG tablet Take 1 tablet (25 mg total) by mouth daily. For mood stabilization 30 tablet 0  . medroxyPROGESTERone (DEPO-PROVERA) 150 MG/ML injection Inject 1 mL (150 mg total) into the muscle every 3 (three) months. For prevention of  pregnancy 1 mL   . metFORMIN (GLUCOPHAGE) 500 MG tablet Take 500 mg by mouth 2 (two) times daily with a meal.    . OLANZapine zydis (ZYPREXA) 15 MG disintegrating tablet Take 1 tablet (15 mg total) by mouth at bedtime. For mood control 30 tablet 0  . sodium chloride (OCEAN) 0.65 % SOLN nasal spray Place 1 spray into both nostrils as needed for congestion.  0   No current facility-administered medications on file prior to visit.     PAST MEDICAL HISTORY: Past Medical History:  Diagnosis Date  . Bipolar 1 disorder ([place])   . Bowel incontinence   . Depression   . Mental disability     PAST SURGICAL HISTORY: Past Surgical History:  Procedure Laterality Date  . FOOT SURGERY      SOCIAL HISTORY: Social History   Tobacco Use  . Smoking status: Never Smoker  . Smokeless tobacco: Never Used  Substance Use Topics  . Alcohol use: No  . Drug use: No    FAMILY HISTORY: Family History  Problem Relation Age of Onset  . High blood pressure Mother   . Anxiety disorder Mother   . Diabetes Maternal Grandmother     ROS: Review of Systems  Constitutional: Positive for malaise/Cindy.  HENT: Positive for nosebleeds and sinus pain.        Dry Mouth  Eyes:       Wear Glasses or Contacts  Respiratory: Positive for cough and shortness of breath (on exertion).   Cardiovascular: Negative for chest pain and orthopnea.  Gastrointestinal: Negative for nausea and vomiting.  Musculoskeletal: Negative for myalgias.       Negative for muscle weakness  Neurological: Positive for headaches.  Endo/Heme/Allergies:       Negative for hypoglycemia  Psychiatric/Behavioral: The patient has insomnia.     PHYSICAL EXAM: Blood pressure 115/71, pulse 87, temperature 98 F (36.7 C), temperature source Oral, height 5\' 11"  (1.803 m), weight 289 lb (131.1 kg), SpO2 97 %. Body mass index is 40.31 kg/m. Physical Exam  Constitutional: She is oriented to person, place, and time. She appears well-developed  and well-nourished.  HENT:  Head: Normocephalic and atraumatic.  Nose: Nose normal.  Eyes: EOM are normal. No scleral icterus.  Neck: Normal range of motion. Neck supple. No thyromegaly present.  Cardiovascular: Normal rate and regular rhythm.  Pulmonary/Chest: Effort normal. No respiratory distress.  Abdominal: Soft. There is no tenderness.  + obesity  Musculoskeletal: Normal range of motion.  Range of Motion is normal in all 4 extremities  Neurological: She is alert and oriented to person, place, and time. Coordination normal.  Skin: Skin is warm and dry.  Psychiatric: She has a normal mood and affect. Her behavior is normal.  Vitals reviewed.   RECENT LABS AND TESTS: BMET    Component Value Date/Time   NA 136 09/09/2014 1846   K 4.0 09/09/2014 1846   CL 103 09/09/2014 1846   CO2 25 09/09/2014 1846   GLUCOSE 99 09/09/2014 1846   BUN 12 09/09/2014 1846   CREATININE 0.92 09/09/2014 1846   CALCIUM 9.5 09/09/2014 1846   GFRNONAA 86 (L) 09/09/2014 1846   GFRAA >90  09/09/2014 1846   Lab Results  Component Value Date   HGBA1C 5.4 09/11/2014   No results found for: INSULIN CBC    Component Value Date/Time   WBC 6.9 09/09/2014 1846   RBC 3.85 (L) 09/09/2014 1846   HGB 11.6 (L) 09/09/2014 1846   HCT 34.1 (L) 09/09/2014 1846   PLT 172 09/09/2014 1846   MCV 88.6 09/09/2014 1846   MCH 30.1 09/09/2014 1846   MCHC 34.0 09/09/2014 1846   RDW 12.7 09/09/2014 1846   LYMPHSABS 1.9 09/09/2014 1846   MONOABS 0.5 09/09/2014 1846   EOSABS 0.1 09/09/2014 1846   BASOSABS 0.0 09/09/2014 1846   Iron/TIBC/Ferritin/ %Sat No results found for: IRON, TIBC, FERRITIN, IRONPCTSAT Lipid Panel     Component Value Date/Time   CHOL 184 09/11/2014 0630   TRIG 66 09/11/2014 0630   HDL 45 09/11/2014 0630   CHOLHDL 4.1 09/11/2014 0630   VLDL 13 09/11/2014 0630   LDLCALC 126 (H) 09/11/2014 0630   Hepatic Function Panel     Component Value Date/Time   PROT 8.3 09/09/2014 1846   ALBUMIN  4.8 09/09/2014 1846   AST 16 09/09/2014 1846   ALT 11 09/09/2014 1846   ALKPHOS 59 09/09/2014 1846   BILITOT 0.6 09/09/2014 1846      Component Value Date/Time   TSH 3.374 09/11/2014 0630   Vitamin D There are no recent results  ECG  shows NSR with a rate of 95 BPM INDIRECT CALORIMETER done today shows a VO2 of 220 and a REE of 1533. Her calculated basal metabolic rate is 2706 thus her basal metabolic rate is worse than expected.    ASSESSMENT AND PLAN: Other Cindy - Plan: EKG 12-Lead, T3, T4, free, TSH, CBC With Differential  Shortness of breath on exertion  Type 2 diabetes mellitus without complication, without long-term current use of insulin (HCC) - Plan: Comprehensive metabolic panel, Hemoglobin A1c, Insulin, random, Microalbumin / creatinine urine ratio  Other hyperlipidemia - Plan: Lipid Panel With LDL/HDL Ratio  Vitamin D deficiency - Plan: VITAMIN D 25 Hydroxy (Vit-D Deficiency, Fractures)  Depression screening  Class 3 severe obesity with serious comorbidity and body mass index (BMI) of 40.0 to 44.9 in adult, unspecified obesity type (HCC)  PLAN:  Cindy Ariella was informed that her Cindy may be related to obesity, depression or many other causes. Labs will be ordered, and in the meanwhile Antonique has agreed to work on diet, exercise and weight loss to help with Cindy. Proper sleep hygiene was discussed including the need for 7-8 hours of quality sleep each night. A sleep study was not ordered based on symptoms and Epworth score.  Dyspnea on exertion Liany's shortness of breath appears to be obesity related and exercise induced. She has agreed to work on weight loss and gradually increase exercise to treat her exercise induced shortness of breath. If Jaquala follows our instructions and loses weight without improvement of her shortness of breath, we will plan to refer to pulmonology. We will monitor this condition regularly. Denicia agrees to this  plan.  Vitamin D Deficiency Thanya was informed that low vitamin D levels contributes to Cindy and are associated with obesity, breast, and colon cancer. We will check labs and she will follow up for routine testing of vitamin D, at least 2-3 times per year. Noralee agrees to follow up with our clinic in 2 weeks.  Hyperlipidemia Fumiye was informed of the American Heart Association Guidelines emphasizing intensive lifestyle modifications as the first line treatment  for hyperlipidemia. We discussed many lifestyle modifications today in depth, and Gearldene will start to work on decreasing saturated fats such as fatty red meat, butter and many fried foods. She will also increase vegetables and lean protein in her diet and work on exercise and weight loss efforts. Sameen will continue her medications as prescribed.We will check labs and Wrigley will follow up as directed.  Diabetes II Tenessa has been given extensive diabetes education by myself today including ideal fasting and post-prandial blood glucose readings, individual ideal Hgb A1c goals and hypoglycemia prevention. We discussed the importance of good blood sugar control to decrease the likelihood of diabetic complications such as nephropathy, neuropathy, limb loss, blindness, coronary artery disease, and death. We discussed the importance of intensive lifestyle modification including diet, exercise and weight loss as the first line treatment for diabetes. Lyn agrees to start diet and check her blood sugar 2 times daily. We will check labs and she will follow up at the agreed upon time.  Depression Screen Sharonann had a strongly positive depression screening. Depression is commonly associated with obesity and often results in emotional eating behaviors. We will monitor this closely and work on CBT to help improve the non-hunger eating patterns. Referral to Psychology may be required if no improvement is seen as she continues in our  clinic.  Obesity Jeselle is currently in the action stage of change and her goal is to continue with weight loss efforts She has agreed to follow the Category 2 plan Mamta has been instructed to work up to a goal of 150 minutes of combined cardio and strengthening exercise per week for weight loss and overall health benefits. We discussed the following Behavioral Modification Strategies today: increasing lean protein intake, decreasing simple carbohydrates  and work on meal planning and easy cooking plans  Shereece has agreed to follow up with our clinic in 2 weeks. She was informed of the importance of frequent follow up visits to maximize her success with intensive lifestyle modifications for her multiple health conditions. She was informed we would discuss her lab results at her next visit unless there is a critical issue that needs to be addressed sooner. Shaden agreed to keep her next visit at the agreed upon time to discuss these results.    OBESITY BEHAVIORAL INTERVENTION VISIT  Today's visit was # 1 out of 22.  Starting weight: 289 lbs Starting date: 11/26/17 Today's weight : 289 lbs  Today's date: 11/26/2017 Total lbs lost to date: 0 (Patients must lose 7 lbs in the first 6 months to continue with counseling)   ASK: We discussed the diagnosis of obesity with [name] today and Jeweliana agreed to give [place] permission to discuss obesity behavioral modification therapy today.  ASSESS: Eun has the diagnosis of obesity and her BMI today is 40.33 Catrina is in the action stage of change   ADVISE: Jason was educated on the multiple health risks of obesity as well as the benefit of weight loss to improve her health. She was advised of the need for long term treatment and the importance of lifestyle modifications.  AGREE: Multiple dietary modification options and treatment options were discussed and  Amaiya agreed to the above obesity treatment plan.   I, Doreene Nest, am acting as transcriptionist for Dennard Nip, MD  I have reviewed the above documentation for accuracy and completeness, and I agree with the above. -Dennard Nip, MD

## 2017-11-27 LAB — CBC WITH DIFFERENTIAL
Basophils Absolute: 0 10*3/uL (ref 0.0–0.2)
Basos: 0 %
EOS (ABSOLUTE): 0 10*3/uL (ref 0.0–0.4)
Eos: 0 %
Hematocrit: 38.8 % (ref 34.0–46.6)
Hemoglobin: 13 g/dL (ref 11.1–15.9)
Immature Grans (Abs): 0 10*3/uL (ref 0.0–0.1)
Immature Granulocytes: 0 %
Lymphocytes Absolute: 2.1 10*3/uL (ref 0.7–3.1)
Lymphs: 32 %
MCH: 29.4 pg (ref 26.6–33.0)
MCHC: 33.5 g/dL (ref 31.5–35.7)
MCV: 88 fL (ref 79–97)
Monocytes Absolute: 0.4 10*3/uL (ref 0.1–0.9)
Monocytes: 6 %
Neutrophils Absolute: 4 10*3/uL (ref 1.4–7.0)
Neutrophils: 62 %
RBC: 4.42 x10E6/uL (ref 3.77–5.28)
RDW: 13.7 % (ref 12.3–15.4)
WBC: 6.4 10*3/uL (ref 3.4–10.8)

## 2017-11-27 LAB — COMPREHENSIVE METABOLIC PANEL
ALT: 13 IU/L (ref 0–32)
AST: 14 IU/L (ref 0–40)
Albumin/Globulin Ratio: 1.6 (ref 1.2–2.2)
Albumin: 4.6 g/dL (ref 3.5–5.5)
Alkaline Phosphatase: 114 IU/L (ref 39–117)
BUN / CREAT RATIO: 11 (ref 9–23)
BUN: 9 mg/dL (ref 6–20)
Bilirubin Total: 0.3 mg/dL (ref 0.0–1.2)
CHLORIDE: 103 mmol/L (ref 96–106)
CO2: 20 mmol/L (ref 20–29)
Calcium: 9.8 mg/dL (ref 8.7–10.2)
Creatinine, Ser: 0.84 mg/dL (ref 0.57–1.00)
GFR calc non Af Amer: 95 mL/min/{1.73_m2} (ref >59)
GFR, EST AFRICAN AMERICAN: 109 mL/min/{1.73_m2} (ref >59)
GLUCOSE: 94 mg/dL (ref 65–99)
Globulin, Total: 2.8 g/dL (ref 1.5–4.5)
Potassium: 4.5 mmol/L (ref 3.5–5.2)
Sodium: 139 mmol/L (ref 134–144)
TOTAL PROTEIN: 7.4 g/dL (ref 6.0–8.5)

## 2017-11-27 LAB — MICROALBUMIN / CREATININE URINE RATIO
Creatinine, Urine: 10.5 mg/dL
Microalb/Creat Ratio: <28.6 mg/g creat (ref 0.0–30.0)

## 2017-11-27 LAB — LIPID PANEL WITH LDL/HDL RATIO
CHOLESTEROL TOTAL: 164 mg/dL (ref 100–199)
HDL: 44 mg/dL (ref >39)
LDL Calculated: 97 mg/dL (ref 0–99)
LDl/HDL Ratio: 2.2 ratio (ref 0.0–3.2)
TRIGLYCERIDES: 117 mg/dL (ref 0–149)
VLDL CHOLESTEROL CAL: 23 mg/dL (ref 5–40)

## 2017-11-27 LAB — HEMOGLOBIN A1C
Est. average glucose Bld gHb Est-mCnc: 108 mg/dL
Hgb A1c MFr Bld: 5.4 % (ref 4.8–5.6)

## 2017-11-27 LAB — VITAMIN D 25 HYDROXY (VIT D DEFICIENCY, FRACTURES): Vit D, 25-Hydroxy: 18 ng/mL — ABNORMAL LOW (ref 30.0–100.0)

## 2017-11-27 LAB — TSH: TSH: 2.8 u[IU]/mL (ref 0.450–4.500)

## 2017-11-27 LAB — INSULIN, RANDOM: INSULIN: 44 u[IU]/mL — AB (ref 2.6–24.9)

## 2017-11-27 LAB — T4, FREE: FREE T4: 1.14 ng/dL (ref 0.82–1.77)

## 2017-11-27 LAB — T3: T3, Total: 138 ng/dL (ref 71–180)

## 2017-12-11 ENCOUNTER — Encounter (INDEPENDENT_AMBULATORY_CARE_PROVIDER_SITE_OTHER): Payer: Self-pay

## 2017-12-11 ENCOUNTER — Ambulatory Visit (INDEPENDENT_AMBULATORY_CARE_PROVIDER_SITE_OTHER): Payer: Medicare Other | Admitting: Family Medicine

## 2017-12-24 DIAGNOSIS — M25561 Pain in right knee: Secondary | ICD-10-CM | POA: Diagnosis not present

## 2017-12-24 DIAGNOSIS — M25562 Pain in left knee: Secondary | ICD-10-CM | POA: Diagnosis not present

## 2018-01-06 DIAGNOSIS — E119 Type 2 diabetes mellitus without complications: Secondary | ICD-10-CM | POA: Diagnosis not present

## 2018-01-06 DIAGNOSIS — J219 Acute bronchiolitis, unspecified: Secondary | ICD-10-CM | POA: Diagnosis not present

## 2018-01-06 DIAGNOSIS — Z0000 Encounter for general adult medical examination without abnormal findings: Secondary | ICD-10-CM | POA: Diagnosis not present

## 2018-01-14 DIAGNOSIS — E119 Type 2 diabetes mellitus without complications: Secondary | ICD-10-CM | POA: Diagnosis not present

## 2018-01-14 DIAGNOSIS — R21 Rash and other nonspecific skin eruption: Secondary | ICD-10-CM | POA: Diagnosis not present

## 2018-01-20 ENCOUNTER — Ambulatory Visit (INDEPENDENT_AMBULATORY_CARE_PROVIDER_SITE_OTHER): Payer: Medicare Other | Admitting: Family Medicine

## 2018-01-20 VITALS — BP 113/75 | HR 77 | Temp 97.9°F | Ht 71.0 in | Wt 290.0 lb

## 2018-01-20 DIAGNOSIS — E559 Vitamin D deficiency, unspecified: Secondary | ICD-10-CM

## 2018-01-20 DIAGNOSIS — Z6841 Body Mass Index (BMI) 40.0 and over, adult: Secondary | ICD-10-CM

## 2018-01-20 DIAGNOSIS — E119 Type 2 diabetes mellitus without complications: Secondary | ICD-10-CM

## 2018-01-20 MED ORDER — METFORMIN HCL 500 MG PO TABS: 500.0000 mg | ORAL_TABLET | ORAL | 0 refills | Status: DC

## 2018-01-20 MED ORDER — VITAMIN D (ERGOCALCIFEROL) 1.25 MG (50000 UNIT) PO CAPS: 50000.0000 [IU] | ORAL_CAPSULE | ORAL | 0 refills | Status: DC

## 2018-01-20 NOTE — Progress Notes (Signed)
Office: [phone]  /  Fax: [phone]   HPI:   Chief Complaint: OBESITY Cindy Phillips is here to discuss her progress with her obesity treatment plan. She is on the Category 2 plan and is following her eating plan approximately 0 % of the time. She states she is exercising 0 minutes 0 times per week. Cindy Phillips's first visit was approximately one month ago and she was supposed to be seen in 2 weeks, but she had transportation problems. She was unable to follow he diet prescription and she didn't like many food options. Her finances are extremely limited as well. Her weight is 290 lb (131.5 kg) today and has had a weight gain of 1 pounds over a period of 8 weeks since her last visit. She has gained 1 lb since starting treatment with [place].  Diabetes II Cindy Phillips has a diagnosis of diabetes type II. Cindy Phillips  Didn't bring in her glucose log today, but she states her glucose ranges between 90 and 144. Cindy Phillips denies any hypoglycemic episodes. Last A1c was at 5.4 She has been working on intensive lifestyle modifications including diet, exercise, and weight loss to help control her blood glucose levels.  Vitamin D deficiency Cindy Phillips has a diagnosis of vitamin D deficiency and her levels are low. Cindy Phillips is not currently taking vit D. She admits fatigue and denies nausea, vomiting or muscle weakness.  ALLERGIES: No Known Allergies  MEDICATIONS: Current Outpatient Medications on File Prior to Visit  Medication Sig Dispense Refill  . atorvastatin (LIPITOR) 10 MG tablet Take 10 mg by mouth daily.    . hydrOXYzine (ATARAX/VISTARIL) 50 MG tablet Take 1 tablet (50 mg total) by mouth every 4 (four) hours as needed for anxiety. 45 tablet 0  . lamoTRIgine (LAMICTAL) 25 MG tablet Take 1 tablet (25 mg total) by mouth daily. For mood stabilization 30 tablet 0  . medroxyPROGESTERone (DEPO-PROVERA) 150 MG/ML injection Inject 1 mL (150 mg total) into the muscle every 3 (three) months. For prevention of pregnancy 1 mL    . metFORMIN (GLUCOPHAGE) 500 MG tablet Take 500 mg by mouth 2 (two) times daily with a meal.    . OLANZapine zydis (ZYPREXA) 15 MG disintegrating tablet Take 1 tablet (15 mg total) by mouth at bedtime. For mood control 30 tablet 0  . sodium chloride (OCEAN) 0.65 % SOLN nasal spray Place 1 spray into both nostrils as needed for congestion.  0   No current facility-administered medications on file prior to visit.     PAST MEDICAL HISTORY: Past Medical History:  Diagnosis Date  . Bipolar 1 disorder ([place])   . Bowel incontinence   . Depression   . Mental disability     PAST SURGICAL HISTORY: Past Surgical History:  Procedure Laterality Date  . FOOT SURGERY      SOCIAL HISTORY: Social History   Tobacco Use  . Smoking status: Never Smoker  . Smokeless tobacco: Never Used  Substance Use Topics  . Alcohol use: No  . Drug use: No    FAMILY HISTORY: Family History  Problem Relation Age of Onset  . High blood pressure Mother   . Anxiety disorder Mother   . Diabetes Maternal Grandmother     ROS: Review of Systems  Constitutional: Positive for malaise/fatigue. Negative for weight loss.  Gastrointestinal: Negative for nausea and vomiting.  Musculoskeletal:       Negative for muscle weakness  Endo/Heme/Allergies:       Negative for hypoglycemia    PHYSICAL EXAM: Blood pressure  113/75, pulse 77, temperature 97.9 F (36.6 C), temperature source Oral, height 5\' 11"  (1.803 m), weight 290 lb (131.5 kg), SpO2 99 %. Body mass index is 40.45 kg/m. Physical Exam  Constitutional: She is oriented to person, place, and time. She appears well-developed and well-nourished.  Cardiovascular: Normal rate.  Pulmonary/Chest: Effort normal.  Musculoskeletal: Normal range of motion.  Neurological: She is oriented to person, place, and time.  Skin: Skin is warm and dry.  Psychiatric: She has a normal mood and affect. Her behavior is normal.  Vitals reviewed.   RECENT LABS AND  TESTS: BMET    Component Value Date/Time   NA 139 11/26/2017 0859   K 4.5 11/26/2017 0859   CL 103 11/26/2017 0859   CO2 20 11/26/2017 0859   GLUCOSE 94 11/26/2017 0859   GLUCOSE 99 09/09/2014 1846   BUN 9 11/26/2017 0859   CREATININE 0.84 11/26/2017 0859   CALCIUM 9.8 11/26/2017 0859   GFRNONAA 95 11/26/2017 0859   GFRAA 109 11/26/2017 0859   Lab Results  Component Value Date   HGBA1C 5.4 11/26/2017   HGBA1C 5.4 09/11/2014   Lab Results  Component Value Date   INSULIN 44.0 (H) 11/26/2017   CBC    Component Value Date/Time   WBC 6.4 11/26/2017 0859   WBC 6.9 09/09/2014 1846   RBC 4.42 11/26/2017 0859   RBC 3.85 (L) 09/09/2014 1846   HGB 13.0 11/26/2017 0859   HCT 38.8 11/26/2017 0859   PLT 172 09/09/2014 1846   MCV 88 11/26/2017 0859   MCH 29.4 11/26/2017 0859   MCH 30.1 09/09/2014 1846   MCHC 33.5 11/26/2017 0859   MCHC 34.0 09/09/2014 1846   RDW 13.7 11/26/2017 0859   LYMPHSABS 2.1 11/26/2017 0859   MONOABS 0.5 09/09/2014 1846   EOSABS 0.0 11/26/2017 0859   BASOSABS 0.0 11/26/2017 0859   Iron/TIBC/Ferritin/ %Sat No results found for: IRON, TIBC, FERRITIN, IRONPCTSAT Lipid Panel     Component Value Date/Time   CHOL 164 11/26/2017 0859   TRIG 117 11/26/2017 0859   HDL 44 11/26/2017 0859   CHOLHDL 4.1 09/11/2014 0630   VLDL 13 09/11/2014 0630   LDLCALC 97 11/26/2017 0859   Hepatic Function Panel     Component Value Date/Time   PROT 7.4 11/26/2017 0859   ALBUMIN 4.6 11/26/2017 0859   AST 14 11/26/2017 0859   ALT 13 11/26/2017 0859   ALKPHOS 114 11/26/2017 0859   BILITOT 0.3 11/26/2017 0859      Component Value Date/Time   TSH 2.800 11/26/2017 0859   TSH 3.374 09/11/2014 0630   Results for SHULAMIS, Cindy Phillips (MRN [id number]) as of 01/20/2018 16:52  Ref. Range 11/26/2017 08:59  Vitamin D, 25-Hydroxy Latest Ref Range: 30.0 - 100.0 ng/mL 18.0 (L)   ASSESSMENT AND PLAN: Type 2 diabetes mellitus without complication, without long-term current use of  insulin (HCC)  Vitamin D deficiency  Class 3 severe obesity with serious comorbidity and body mass index (BMI) of 40.0 to 44.9 in adult, unspecified obesity type ([place])  PLAN:  Diabetes II Jolyssa has been given extensive diabetes education by myself today including ideal fasting and post-prandial blood glucose readings, individual ideal Hgb A1c goals and hypoglycemia prevention. We discussed the importance of good blood sugar control to decrease the likelihood of diabetic complications such as nephropathy, neuropathy, limb loss, blindness, coronary artery disease, and death. We discussed the importance of intensive lifestyle modification including diet, exercise and weight loss as the first line treatment for diabetes.  Vesta agrees to increase metformin to 2 tablets qAM #90 with no refills and follow up at the agreed upon time.  Vitamin D Deficiency Othello was informed that low vitamin D levels contributes to fatigue and are associated with obesity, breast, and colon cancer. She agrees to start prescription Vit D @50 ,000 IU every week #30 with no refills and will follow up for routine testing of vitamin D, at least 2-3 times per year. She was informed of the risk of over-replacement of vitamin D and agrees to not increase her dose unless she discusses this with [place] first. Mavery agrees to follow up as directed.  We spent > than 50% of the 30 minute visit on the counseling as documented in the note.  Obesity Rica is currently in the action stage of change. Patient may not have the ability to follow a structured plan or keep a food journal with her bipolar disorder and mental disabilities. As such, her goal is to concentrate on generalized portion control, making smarter food choices strategies.  She has agreed to portion control better and make smarter food choices, such as increase vegetables and decrease simple carbohydrates  Brytni has been instructed to work up to a goal of 150 minutes  of combined cardio and strengthening exercise per week for weight loss and overall health benefits. We discussed the following Behavioral Modification Strategies today: increase H2O intake, increasing lean protein intake, decreasing simple carbohydrates , increasing vegetables, work on meal planning and easy cooking plans and decrease liquid calories  Karington has agreed to follow up with our clinic in 4 weeks. She was informed of the importance of frequent follow up visits to maximize her success with intensive lifestyle modifications for her multiple health conditions.   OBESITY BEHAVIORAL INTERVENTION VISIT  Today's visit was # 2 out of 22.  Starting weight: 289 lbs Starting date: 11/26/17 Today's weight : 290 lbs Today's date: 01/20/2018 Total lbs lost to date: 0 (Patients must lose 7 lbs in the first 6 months to continue with counseling)   ASK: We discussed the diagnosis of obesity with [name] today and [name] agreed to give [place] permission to discuss obesity behavioral modification therapy today.  ASSESS: Alyn has the diagnosis of obesity and her BMI today is 40.46 Jonie is in the action stage of change   ADVISE: Kinisha was educated on the multiple health risks of obesity as well as the benefit of weight loss to improve her health. She was advised of the need for long term treatment and the importance of lifestyle modifications.  AGREE: Multiple dietary modification options and treatment options were discussed and  Page agreed to the above obesity treatment plan.  I, Doreene Nest, am acting as transcriptionist for Dennard Nip, MD  I have reviewed the above documentation for accuracy and completeness, and I agree with the above. -Dennard Nip, MD

## 2018-01-27 DIAGNOSIS — E119 Type 2 diabetes mellitus without complications: Secondary | ICD-10-CM | POA: Diagnosis not present

## 2018-01-27 DIAGNOSIS — R05 Cough: Secondary | ICD-10-CM | POA: Diagnosis not present

## 2018-01-27 DIAGNOSIS — K5900 Constipation, unspecified: Secondary | ICD-10-CM | POA: Diagnosis not present

## 2018-01-29 ENCOUNTER — Other Ambulatory Visit: Payer: Self-pay | Admitting: Family Medicine

## 2018-01-29 ENCOUNTER — Other Ambulatory Visit (HOSPITAL_COMMUNITY)
Admission: RE | Admit: 2018-01-29 | Discharge: 2018-01-29 | Disposition: A | Discharge status: Home / Self Care | Payer: Medicare Other | Source: Ambulatory Visit | Attending: Family Medicine | Admitting: Family Medicine

## 2018-01-29 DIAGNOSIS — Z01419 Encounter for gynecological examination (general) (routine) without abnormal findings: Secondary | ICD-10-CM | POA: Diagnosis not present

## 2018-01-29 DIAGNOSIS — Z01411 Encounter for gynecological examination (general) (routine) with abnormal findings: Secondary | ICD-10-CM | POA: Insufficient documentation

## 2018-01-29 DIAGNOSIS — E119 Type 2 diabetes mellitus without complications: Secondary | ICD-10-CM | POA: Diagnosis not present

## 2018-01-30 LAB — CYTOLOGY - PAP: Diagnosis: NEGATIVE

## 2018-02-03 LAB — URINE CYTOLOGY ANCILLARY ONLY
Bacterial vaginitis: NEGATIVE
Candida vaginitis: NEGATIVE
Chlamydia: NEGATIVE
Neisseria Gonorrhea: NEGATIVE
Trichomonas: NEGATIVE

## 2018-02-10 ENCOUNTER — Other Ambulatory Visit (INDEPENDENT_AMBULATORY_CARE_PROVIDER_SITE_OTHER): Payer: Self-pay | Admitting: Family Medicine

## 2018-02-17 ENCOUNTER — Ambulatory Visit (INDEPENDENT_AMBULATORY_CARE_PROVIDER_SITE_OTHER): Payer: Medicare Other | Admitting: Family Medicine

## 2018-02-17 VITALS — BP 123/82 | HR 76 | Temp 98.3°F | Ht 71.0 in | Wt 287.0 lb

## 2018-02-17 DIAGNOSIS — E119 Type 2 diabetes mellitus without complications: Secondary | ICD-10-CM | POA: Diagnosis not present

## 2018-02-17 DIAGNOSIS — Z6841 Body Mass Index (BMI) 40.0 and over, adult: Secondary | ICD-10-CM | POA: Diagnosis not present

## 2018-02-17 DIAGNOSIS — E559 Vitamin D deficiency, unspecified: Secondary | ICD-10-CM

## 2018-02-17 MED ORDER — VITAMIN D (ERGOCALCIFEROL) 1.25 MG (50000 UNIT) PO CAPS: 50000.0000 [IU] | ORAL_CAPSULE | ORAL | 0 refills | Status: DC

## 2018-02-17 MED ORDER — METFORMIN HCL 500 MG PO TABS: 500.0000 mg | ORAL_TABLET | ORAL | 0 refills | Status: DC

## 2018-02-17 NOTE — Progress Notes (Signed)
Office: [phone]  /  Fax: [phone]   HPI:   Chief Complaint: OBESITY Chance is here to discuss her progress with her obesity treatment plan. She is on the  portion control better and make smarter food choices plan and is following her eating plan approximately 30 % of the time. She states she is exercising 0 minutes 0 times per week. Kimanh did well with weight loss. She reports not eating all her food due to increased metformin decreasing her appetite. Her weight is 287 lb (130.2 kg) today and has had a weight loss of 3 pounds over a period of 4 weeks since her last visit. She has lost 2 lbs since starting treatment with [place].  Vitamin D deficiency Kearsten has a diagnosis of vitamin D deficiency. Last level was not at goal She is currently taking prescription vit D and denies nausea, vomiting or muscle weakness.  Diabetes II Starlina has a diagnosis of diabetes type II. Donnajean is on metformin and denies any hypoglycemic episodes, nausea or diarrhea. She has decreased appetite and she is currently taking metformin 2 tablets in the morning and 1 tablet at dinner. Last A1c was at 5.4 She has been working on intensive lifestyle modifications including diet, exercise, and weight loss to help control her blood glucose levels.  ALLERGIES: No Known Allergies  MEDICATIONS: Current Outpatient Medications on File Prior to Visit  Medication Sig Dispense Refill  . atorvastatin (LIPITOR) 10 MG tablet Take 10 mg by mouth daily.    . hydrOXYzine (ATARAX/VISTARIL) 50 MG tablet Take 1 tablet (50 mg total) by mouth every 4 (four) hours as needed for anxiety. 45 tablet 0  . lamoTRIgine (LAMICTAL) 25 MG tablet Take 1 tablet (25 mg total) by mouth daily. For mood stabilization 30 tablet 0  . medroxyPROGESTERone (DEPO-PROVERA) 150 MG/ML injection Inject 1 mL (150 mg total) into the muscle every 3 (three) months. For prevention of pregnancy 1 mL   . OLANZapine zydis (ZYPREXA) 15 MG disintegrating  tablet Take 1 tablet (15 mg total) by mouth at bedtime. For mood control 30 tablet 0  . sodium chloride (OCEAN) 0.65 % SOLN nasal spray Place 1 spray into both nostrils as needed for congestion.  0   No current facility-administered medications on file prior to visit.     PAST MEDICAL HISTORY: Past Medical History:  Diagnosis Date  . Bipolar 1 disorder ([place])   . Bowel incontinence   . Depression   . Mental disability     PAST SURGICAL HISTORY: Past Surgical History:  Procedure Laterality Date  . FOOT SURGERY      SOCIAL HISTORY: Social History   Tobacco Use  . Smoking status: Never Smoker  . Smokeless tobacco: Never Used  Substance Use Topics  . Alcohol use: No  . Drug use: No    FAMILY HISTORY: Family History  Problem Relation Age of Onset  . High blood pressure Mother   . Anxiety disorder Mother   . Diabetes Maternal Grandmother     ROS: Review of Systems  Constitutional: Positive for weight loss.  Gastrointestinal: Negative for diarrhea, nausea and vomiting.  Musculoskeletal:       Negative for muscle weakness  Endo/Heme/Allergies:       Negative for hypoglycemia    PHYSICAL EXAM: Blood pressure 123/82, pulse 76, temperature 98.3 F (36.8 C), temperature source Oral, height 5\' 11"  (1.803 m), weight 287 lb (130.2 kg), SpO2 98 %. Body mass index is 40.03 kg/m. Physical Exam  Constitutional: She  is oriented to person, place, and time. She appears well-developed and well-nourished.  Cardiovascular: Normal rate.  Pulmonary/Chest: Effort normal.  Musculoskeletal: Normal range of motion.  Neurological: She is oriented to person, place, and time.  Skin: Skin is warm and dry.  Psychiatric: She has a normal mood and affect. Her behavior is normal.  Vitals reviewed.   RECENT LABS AND TESTS: BMET    Component Value Date/Time   NA 139 11/26/2017 0859   K 4.5 11/26/2017 0859   CL 103 11/26/2017 0859   CO2 20 11/26/2017 0859   GLUCOSE 94 11/26/2017 0859     GLUCOSE 99 09/09/2014 1846   BUN 9 11/26/2017 0859   CREATININE 0.84 11/26/2017 0859   CALCIUM 9.8 11/26/2017 0859   GFRNONAA 95 11/26/2017 0859   GFRAA 109 11/26/2017 0859   Lab Results  Component Value Date   HGBA1C 5.4 11/26/2017   HGBA1C 5.4 09/11/2014   Lab Results  Component Value Date   INSULIN 44.0 (H) 11/26/2017   CBC    Component Value Date/Time   WBC 6.4 11/26/2017 0859   WBC 6.9 09/09/2014 1846   RBC 4.42 11/26/2017 0859   RBC 3.85 (L) 09/09/2014 1846   HGB 13.0 11/26/2017 0859   HCT 38.8 11/26/2017 0859   PLT 172 09/09/2014 1846   MCV 88 11/26/2017 0859   MCH 29.4 11/26/2017 0859   MCH 30.1 09/09/2014 1846   MCHC 33.5 11/26/2017 0859   MCHC 34.0 09/09/2014 1846   RDW 13.7 11/26/2017 0859   LYMPHSABS 2.1 11/26/2017 0859   MONOABS 0.5 09/09/2014 1846   EOSABS 0.0 11/26/2017 0859   BASOSABS 0.0 11/26/2017 0859   Iron/TIBC/Ferritin/ %Sat No results found for: IRON, TIBC, FERRITIN, IRONPCTSAT Lipid Panel     Component Value Date/Time   CHOL 164 11/26/2017 0859   TRIG 117 11/26/2017 0859   HDL 44 11/26/2017 0859   CHOLHDL 4.1 09/11/2014 0630   VLDL 13 09/11/2014 0630   LDLCALC 97 11/26/2017 0859   Hepatic Function Panel     Component Value Date/Time   PROT 7.4 11/26/2017 0859   ALBUMIN 4.6 11/26/2017 0859   AST 14 11/26/2017 0859   ALT 13 11/26/2017 0859   ALKPHOS 114 11/26/2017 0859   BILITOT 0.3 11/26/2017 0859      Component Value Date/Time   TSH 2.800 11/26/2017 0859   TSH 3.374 09/11/2014 0630   Results for DASHAWNA, DELBRIDGE (MRN [id number]) as of 02/17/2018 16:12  Ref. Range 11/26/2017 08:59  Vitamin D, 25-Hydroxy Latest Ref Range: 30.0 - 100.0 ng/mL 18.0 (L)   ASSESSMENT AND PLAN: Type 2 diabetes mellitus without complication, without long-term current use of insulin (HCC) - Plan: metFORMIN (GLUCOPHAGE) 500 MG tablet  Vitamin D deficiency - Plan: Vitamin D, Ergocalciferol, (DRISDOL) 50000 units CAPS capsule  Class 3 severe obesity  with serious comorbidity and body mass index (BMI) of 40.0 to 44.9 in adult, unspecified obesity type ([place])  PLAN:  Vitamin D Deficiency Taelyn was informed that low vitamin D levels contributes to fatigue and are associated with obesity, breast, and colon cancer. She agrees to continue to take prescription Vit D @50 ,000 IU every week #4 with no refills. We will recheck labs next month and she will follow up for routine testing of vitamin D, at least 2-3 times per year. She was informed of the risk of over-replacement of vitamin D and agrees to not increase her dose unless she discusses this with [place] first. Danyle agrees to follow up as  directed.   Diabetes II Ellora has been given extensive diabetes education by myself today including ideal fasting and post-prandial blood glucose readings, individual ideal Hgb A1c goals  and hypoglycemia prevention. We discussed the importance of good blood sugar control to decrease the likelihood of diabetic complications such as nephropathy, neuropathy, limb loss, blindness, coronary artery disease, and death. We discussed the importance of intensive lifestyle modification including diet, exercise and weight loss as the first line treatment for diabetes. Malayzia agrees to change metformin to 1 tablet qAM and 2 tablets at dinner #90 with no refills and follow up at the agreed upon time.  Obesity Kalila is currently in the action stage of change. As such, her goal is to continue with weight loss efforts She has agreed to change back to the Category 2 plan Morris has been instructed to work up to a goal of 150 minutes of combined cardio and strengthening exercise per week for weight loss and overall health benefits. We discussed the following Behavioral Modification Strategies today: no skipping meals, increasing lean protein intake and work on meal planning and easy cooking plans  Ideas were given for protein options. She was advised to ensure she is eating all  the food on the plan.  Stefania has agreed to follow up with our clinic in 2 to 3 weeks. She was informed of the importance of frequent follow up visits to maximize her success with intensive lifestyle modifications for her multiple health conditions.   OBESITY BEHAVIORAL INTERVENTION VISIT  Today's visit was # 3 out of 22.  Starting weight: 289 lbs Starting date: 11/26/17 Today's weight : 287 lbs Today's date: 02/17/2018 Total lbs lost to date: 2 (Patients must lose 7 lbs in the first 6 months to continue with counseling)   ASK: We discussed the diagnosis of obesity with [name] today and Ezelle agreed to give [place] permission to discuss obesity behavioral modification therapy today.  ASSESS: Lilienne has the diagnosis of obesity and her BMI today is 40.05 Jamar is in the action stage of change   ADVISE: Yudit was educated on the multiple health risks of obesity as well as the benefit of weight loss to improve her health. She was advised of the need for long term treatment and the importance of lifestyle modifications.  AGREE: Multiple dietary modification options and treatment options were discussed and  Sung agreed to the above obesity treatment plan.  I, Doreene Nest, am acting as transcriptionist for Dennard Nip, MD  I have reviewed the above documentation for accuracy and completeness, and I agree with the above. -Dennard Nip, MD

## 2018-02-26 DIAGNOSIS — M25562 Pain in left knee: Secondary | ICD-10-CM | POA: Diagnosis not present

## 2018-02-26 DIAGNOSIS — M25561 Pain in right knee: Secondary | ICD-10-CM | POA: Diagnosis not present

## 2018-03-10 ENCOUNTER — Ambulatory Visit (INDEPENDENT_AMBULATORY_CARE_PROVIDER_SITE_OTHER): Payer: Medicare Other | Admitting: Family Medicine

## 2018-03-10 ENCOUNTER — Encounter (INDEPENDENT_AMBULATORY_CARE_PROVIDER_SITE_OTHER): Payer: Self-pay | Admitting: Family Medicine

## 2018-03-10 VITALS — BP 102/70 | HR 99 | Temp 98.2°F | Ht 71.0 in | Wt 268.0 lb

## 2018-03-10 DIAGNOSIS — G4733 Obstructive sleep apnea (adult) (pediatric): Secondary | ICD-10-CM | POA: Diagnosis not present

## 2018-03-10 DIAGNOSIS — Z6839 Body mass index (BMI) 39.0-39.9, adult: Secondary | ICD-10-CM

## 2018-03-10 DIAGNOSIS — E559 Vitamin D deficiency, unspecified: Secondary | ICD-10-CM

## 2018-03-10 DIAGNOSIS — E66812 Obesity, class 2: Secondary | ICD-10-CM

## 2018-03-10 MED ORDER — VITAMIN D (ERGOCALCIFEROL) 1.25 MG (50000 UNIT) PO CAPS
50000.0000 [IU] | ORAL_CAPSULE | ORAL | 0 refills | Status: DC
Start: 2018-03-10 — End: 2018-04-23

## 2018-03-11 ENCOUNTER — Other Ambulatory Visit (INDEPENDENT_AMBULATORY_CARE_PROVIDER_SITE_OTHER): Payer: Self-pay | Admitting: Family Medicine

## 2018-03-11 DIAGNOSIS — E559 Vitamin D deficiency, unspecified: Secondary | ICD-10-CM

## 2018-03-11 NOTE — Progress Notes (Signed)
Office: [phone]  /  Fax: [phone]   HPI:   Chief Complaint: OBESITY Cindy Phillips is here to discuss her progress with her obesity treatment plan. She is on the Category 2 plan and is following her eating plan approximately 50 % of the time. She states she is exercising 0 minutes 0 times per week. Cindy Phillips states hunger is controlled, but she is often skipping meals and then eats too many calories. She is struggling with meal planning and prepping. Her weight is 268 lb (121.6 kg) today and has had a weight loss of 1 pound over a period of 3 weeks since her last visit. She has lost 21 lbs since starting treatment with [place].  Vitamin D deficiency Cindy Phillips has a diagnosis of vitamin D deficiency. She is stable on vit D but she is not yet at goal. Cindy Phillips denies nausea, vomiting or muscle weakness.  Sleep Apnea Patient notes feeling very tired in the daytime. She has a history of sleep apnea and is not using CPAP as her last mask didn't fit and she no longer has a sleep doctor.  ALLERGIES: No Known Allergies  MEDICATIONS: Current Outpatient Medications on File Prior to Visit  Medication Sig Dispense Refill  . atorvastatin (LIPITOR) 10 MG tablet Take 10 mg by mouth daily.    . hydrOXYzine (ATARAX/VISTARIL) 50 MG tablet Take 1 tablet (50 mg total) by mouth every 4 (four) hours as needed for anxiety. 45 tablet 0  . lamoTRIgine (LAMICTAL) 25 MG tablet Take 1 tablet (25 mg total) by mouth daily. For mood stabilization 30 tablet 0  . medroxyPROGESTERone (DEPO-PROVERA) 150 MG/ML injection Inject 1 mL (150 mg total) into the muscle every 3 (three) months. For prevention of pregnancy 1 mL   . metFORMIN (GLUCOPHAGE) 500 MG tablet Take 1 tablet (500 mg total) by mouth as directed. Take one tab every AM and 2 tabs every PM 90 tablet 0  . OLANZapine zydis (ZYPREXA) 15 MG disintegrating tablet Take 1 tablet (15 mg total) by mouth at bedtime. For mood control 30 tablet 0  . sodium chloride (OCEAN) 0.65 %  SOLN nasal spray Place 1 spray into both nostrils as needed for congestion.  0   No current facility-administered medications on file prior to visit.     PAST MEDICAL HISTORY: Past Medical History:  Diagnosis Date  . Bipolar 1 disorder ([place])   . Bowel incontinence   . Depression   . Mental disability     PAST SURGICAL HISTORY: Past Surgical History:  Procedure Laterality Date  . FOOT SURGERY      SOCIAL HISTORY: Social History   Tobacco Use  . Smoking status: Never Smoker  . Smokeless tobacco: Never Used  Substance Use Topics  . Alcohol use: No  . Drug use: No    FAMILY HISTORY: Family History  Problem Relation Age of Onset  . High blood pressure Mother   . Anxiety disorder Mother   . Diabetes Maternal Grandmother     ROS: Review of Systems  Constitutional: Positive for malaise/fatigue and weight loss.  Gastrointestinal: Negative for nausea and vomiting.  Musculoskeletal:       Negative for muscle weakness  Endo/Heme/Allergies:       Negative for muscle weakness    PHYSICAL EXAM: Blood pressure 102/70, pulse 99, temperature 98.2 F (36.8 C), temperature source Oral, height 5\' 11"  (1.803 m), weight 268 lb (121.6 kg), SpO2 100 %. Body mass index is 37.38 kg/m. Physical Exam  Constitutional: She is oriented  to person, place, and time. She appears well-developed and well-nourished.  Cardiovascular: Normal rate.  Pulmonary/Chest: Effort normal.  Musculoskeletal: Normal range of motion.  Neurological: She is oriented to person, place, and time.  Skin: Skin is warm and dry.  Psychiatric: She has a normal mood and affect. Her behavior is normal.  Vitals reviewed.   RECENT LABS AND TESTS: BMET    Component Value Date/Time   NA 139 11/26/2017 0859   K 4.5 11/26/2017 0859   CL 103 11/26/2017 0859   CO2 20 11/26/2017 0859   GLUCOSE 94 11/26/2017 0859   GLUCOSE 99 09/09/2014 1846   BUN 9 11/26/2017 0859   CREATININE 0.84 11/26/2017 0859   CALCIUM 9.8  11/26/2017 0859   GFRNONAA 95 11/26/2017 0859   GFRAA 109 11/26/2017 0859   Lab Results  Component Value Date   HGBA1C 5.4 11/26/2017   HGBA1C 5.4 09/11/2014   Lab Results  Component Value Date   INSULIN 44.0 (H) 11/26/2017   CBC    Component Value Date/Time   WBC 6.4 11/26/2017 0859   WBC 6.9 09/09/2014 1846   RBC 4.42 11/26/2017 0859   RBC 3.85 (L) 09/09/2014 1846   HGB 13.0 11/26/2017 0859   HCT 38.8 11/26/2017 0859   PLT 172 09/09/2014 1846   MCV 88 11/26/2017 0859   MCH 29.4 11/26/2017 0859   MCH 30.1 09/09/2014 1846   MCHC 33.5 11/26/2017 0859   MCHC 34.0 09/09/2014 1846   RDW 13.7 11/26/2017 0859   LYMPHSABS 2.1 11/26/2017 0859   MONOABS 0.5 09/09/2014 1846   EOSABS 0.0 11/26/2017 0859   BASOSABS 0.0 11/26/2017 0859   Iron/TIBC/Ferritin/ %Sat No results found for: IRON, TIBC, FERRITIN, IRONPCTSAT Lipid Panel     Component Value Date/Time   CHOL 164 11/26/2017 0859   TRIG 117 11/26/2017 0859   HDL 44 11/26/2017 0859   CHOLHDL 4.1 09/11/2014 0630   VLDL 13 09/11/2014 0630   LDLCALC 97 11/26/2017 0859   Hepatic Function Panel     Component Value Date/Time   PROT 7.4 11/26/2017 0859   ALBUMIN 4.6 11/26/2017 0859   AST 14 11/26/2017 0859   ALT 13 11/26/2017 0859   ALKPHOS 114 11/26/2017 0859   BILITOT 0.3 11/26/2017 0859      Component Value Date/Time   TSH 2.800 11/26/2017 0859   TSH 3.374 09/11/2014 0630   Results for BIANA, HAGGAR (MRN [id number]) as of 03/11/2018 09:44  Ref. Range 11/26/2017 08:59  Vitamin D, 25-Hydroxy Latest Ref Range: 30.0 - 100.0 ng/mL 18.0 (L)   ASSESSMENT AND PLAN: Obstructive sleep apnea syndrome - Plan: Ambulatory referral to Sleep Studies  Vitamin D deficiency - Plan: Vitamin D, Ergocalciferol, (DRISDOL) 50000 units CAPS capsule  Class 2 severe obesity with serious comorbidity and body mass index (BMI) of 39.0 to 39.9 in adult, unspecified obesity type (HCC)  PLAN:  Vitamin D Deficiency Cindy Phillips was informed  that low vitamin D levels contributes to fatigue and are associated with obesity, breast, and colon cancer. She agrees to continue to take prescription Vit D @50 ,000 IU every week #4 with no refills and will follow up for routine testing of vitamin D, at least 2-3 times per year. She was informed of the risk of over-replacement of vitamin D and agrees to not increase her dose unless she discusses this with [place] first. Cindy Phillips agrees to follow up as directed.  Sleep Apnea We will refer to Dr. Brett Fairy for evaluation and treatment. Patient is strongly advised to  look at other CPAP masks and not give up. (referred to [facility]).  Obesity Cindy Phillips is currently in the action stage of change. As such, her goal is to continue with weight loss efforts She has agreed to follow the Category 2 plan Cindy Phillips has been instructed to work up to a goal of 150 minutes of combined cardio and strengthening exercise per week for weight loss and overall health benefits. We discussed the following Behavioral Modification Strategies today: increasing lean protein intake, decreasing simple carbohydrates , decrease eating out and work on meal planning and easy cooking plans  Cindy Phillips has agreed to follow up with our clinic in 2 to 3 weeks with Cindy Phillips our PA. She was informed of the importance of frequent follow up visits to maximize her success with intensive lifestyle modifications for her multiple health conditions.   OBESITY BEHAVIORAL INTERVENTION VISIT  Today's visit was # 4 out of 22.  Starting weight: 289 lbs Starting date: 11/26/17 Today's weight : 268 lbs  Today's date: 03/10/2018 Total lbs lost to date: 21    ASK: We discussed the diagnosis of obesity with Cindy Phillips today and Cindy Phillips agreed to give [place] permission to discuss obesity behavioral modification therapy today.  ASSESS: Cindy Phillips has the diagnosis of obesity and her BMI today is 37.39 Cindy Phillips is in the action stage of  change   ADVISE: Cindy Phillips was educated on the multiple health risks of obesity as well as the benefit of weight loss to improve her health. She was advised of the need for long term treatment and the importance of lifestyle modifications.  AGREE: Multiple dietary modification options and treatment options were discussed and  Cindy Phillips agreed to the above obesity treatment plan.  I, Doreene Nest, am acting as transcriptionist for [employer], MD/  I have reviewed the above documentation for accuracy and completeness, and I agree with the above. -Dennard Nip, MD

## 2018-03-12 ENCOUNTER — Encounter (INDEPENDENT_AMBULATORY_CARE_PROVIDER_SITE_OTHER): Payer: Self-pay | Admitting: Family Medicine

## 2018-03-13 ENCOUNTER — Other Ambulatory Visit (INDEPENDENT_AMBULATORY_CARE_PROVIDER_SITE_OTHER): Payer: Self-pay | Admitting: Family Medicine

## 2018-03-18 ENCOUNTER — Encounter (INDEPENDENT_AMBULATORY_CARE_PROVIDER_SITE_OTHER): Payer: Self-pay | Admitting: Family Medicine

## 2018-03-18 DIAGNOSIS — M25561 Pain in right knee: Secondary | ICD-10-CM | POA: Diagnosis not present

## 2018-03-25 ENCOUNTER — Telehealth (INDEPENDENT_AMBULATORY_CARE_PROVIDER_SITE_OTHER): Payer: Self-pay

## 2018-03-25 ENCOUNTER — Encounter (INDEPENDENT_AMBULATORY_CARE_PROVIDER_SITE_OTHER): Payer: Self-pay | Admitting: Family Medicine

## 2018-03-25 DIAGNOSIS — G4733 Obstructive sleep apnea (adult) (pediatric): Secondary | ICD-10-CM

## 2018-03-25 NOTE — Telephone Encounter (Signed)
Is she referring to [place]?

## 2018-03-28 DIAGNOSIS — M25561 Pain in right knee: Secondary | ICD-10-CM | POA: Diagnosis not present

## 2018-04-01 DIAGNOSIS — M25561 Pain in right knee: Secondary | ICD-10-CM | POA: Diagnosis not present

## 2018-04-02 ENCOUNTER — Ambulatory Visit (INDEPENDENT_AMBULATORY_CARE_PROVIDER_SITE_OTHER): Payer: Medicare Other | Admitting: Family Medicine

## 2018-04-02 VITALS — BP 121/76 | HR 94 | Temp 98.0°F | Ht 71.0 in | Wt 283.0 lb

## 2018-04-02 DIAGNOSIS — Z6839 Body mass index (BMI) 39.0-39.9, adult: Secondary | ICD-10-CM | POA: Diagnosis not present

## 2018-04-02 DIAGNOSIS — E8881 Metabolic syndrome: Secondary | ICD-10-CM

## 2018-04-02 MED ORDER — METFORMIN HCL 500 MG PO TABS: ORAL_TABLET | ORAL | 0 refills | Status: DC

## 2018-04-02 NOTE — Progress Notes (Signed)
Office: [phone]  /  Fax: [phone]   HPI:   Chief Complaint: OBESITY Cindy Phillips is here to discuss her progress with her obesity treatment plan. She is on the Category 2 plan and is following her eating plan approximately 75 % of the time. She states she is walking for 20 minutes 2 times per week. Cindy Phillips is doing well with weight loss, hunger is improved. She is deviating from her plan more especially for breakfast.  Her weight is 283 lb (128.4 kg) today and has had a weight loss of 3 pounds over a period of 3 weeks since her last visit. She has lost 6 lbs since starting treatment with [place].  Insulin Resistance Cindy Phillips has a diagnosis of insulin resistance based on her elevated fasting insulin level >5. Although Cindy Phillips blood glucose readings are still under good control, insulin resistance puts her at greater risk of metabolic syndrome and diabetes. She is doing well on metformin and is working on diet and exercise to decrease risk of diabetes. She denies nausea, vomiting, or hypoglycemia.  ALLERGIES: No Known Allergies  MEDICATIONS: Current Outpatient Medications on File Prior to Visit  Medication Sig Dispense Refill  . atorvastatin (LIPITOR) 10 MG tablet Take 10 mg by mouth daily.    . hydrOXYzine (ATARAX/VISTARIL) 50 MG tablet Take 1 tablet (50 mg total) by mouth every 4 (four) hours as needed for anxiety. 45 tablet 0  . lamoTRIgine (LAMICTAL) 25 MG tablet Take 1 tablet (25 mg total) by mouth daily. For mood stabilization 30 tablet 0  . medroxyPROGESTERone (DEPO-PROVERA) 150 MG/ML injection Inject 1 mL (150 mg total) into the muscle every 3 (three) months. For prevention of pregnancy 1 mL   . OLANZapine zydis (ZYPREXA) 15 MG disintegrating tablet Take 1 tablet (15 mg total) by mouth at bedtime. For mood control 30 tablet 0  . sodium chloride (OCEAN) 0.65 % SOLN nasal spray Place 1 spray into both nostrils as needed for congestion.  0  . Vitamin D, Ergocalciferol, (DRISDOL)  50000 units CAPS capsule Take 1 capsule (50,000 Units total) by mouth every 7 (seven) days. 4 capsule 0   No current facility-administered medications on file prior to visit.     PAST MEDICAL HISTORY: Past Medical History:  Diagnosis Date  . Bipolar 1 disorder ([place])   . Bowel incontinence   . Depression   . Mental disability     PAST SURGICAL HISTORY: Past Surgical History:  Procedure Laterality Date  . FOOT SURGERY      SOCIAL HISTORY: Social History   Tobacco Use  . Smoking status: Never Smoker  . Smokeless tobacco: Never Used  Substance Use Topics  . Alcohol use: No  . Drug use: No    FAMILY HISTORY: Family History  Problem Relation Age of Onset  . High blood pressure Mother   . Anxiety disorder Mother   . Diabetes Maternal Grandmother     ROS: Review of Systems  Constitutional: Positive for weight loss.  Gastrointestinal: Negative for nausea and vomiting.  Endo/Heme/Allergies:       Negative hypoglycemia    PHYSICAL EXAM: Blood pressure 121/76, pulse 94, temperature 98 F (36.7 C), temperature source Oral, height 5\' 11"  (1.803 m), weight 283 lb (128.4 kg), SpO2 98 %. Body mass index is 39.47 kg/m. Physical Exam  Constitutional: She is oriented to person, place, and time. She appears well-developed and well-nourished.  Cardiovascular: Normal rate.  Pulmonary/Chest: Effort normal.  Musculoskeletal: Normal range of motion.  Neurological: She is oriented  to person, place, and time.  Skin: Skin is warm and dry.  Psychiatric: She has a normal mood and affect. Her behavior is normal.  Vitals reviewed.   RECENT LABS AND TESTS: BMET    Component Value Date/Time   NA 139 11/26/2017 0859   K 4.5 11/26/2017 0859   CL 103 11/26/2017 0859   CO2 20 11/26/2017 0859   GLUCOSE 94 11/26/2017 0859   GLUCOSE 99 09/09/2014 1846   BUN 9 11/26/2017 0859   CREATININE 0.84 11/26/2017 0859   CALCIUM 9.8 11/26/2017 0859   GFRNONAA 95 11/26/2017 0859   GFRAA 109  11/26/2017 0859   Lab Results  Component Value Date   HGBA1C 5.4 11/26/2017   HGBA1C 5.4 09/11/2014   Lab Results  Component Value Date   INSULIN 44.0 (H) 11/26/2017   CBC    Component Value Date/Time   WBC 6.4 11/26/2017 0859   WBC 6.9 09/09/2014 1846   RBC 4.42 11/26/2017 0859   RBC 3.85 (L) 09/09/2014 1846   HGB 13.0 11/26/2017 0859   HCT 38.8 11/26/2017 0859   PLT 172 09/09/2014 1846   MCV 88 11/26/2017 0859   MCH 29.4 11/26/2017 0859   MCH 30.1 09/09/2014 1846   MCHC 33.5 11/26/2017 0859   MCHC 34.0 09/09/2014 1846   RDW 13.7 11/26/2017 0859   LYMPHSABS 2.1 11/26/2017 0859   MONOABS 0.5 09/09/2014 1846   EOSABS 0.0 11/26/2017 0859   BASOSABS 0.0 11/26/2017 0859   Iron/TIBC/Ferritin/ %Sat No results found for: IRON, TIBC, FERRITIN, IRONPCTSAT Lipid Panel     Component Value Date/Time   CHOL 164 11/26/2017 0859   TRIG 117 11/26/2017 0859   HDL 44 11/26/2017 0859   CHOLHDL 4.1 09/11/2014 0630   VLDL 13 09/11/2014 0630   LDLCALC 97 11/26/2017 0859   Hepatic Function Panel     Component Value Date/Time   PROT 7.4 11/26/2017 0859   ALBUMIN 4.6 11/26/2017 0859   AST 14 11/26/2017 0859   ALT 13 11/26/2017 0859   ALKPHOS 114 11/26/2017 0859   BILITOT 0.3 11/26/2017 0859      Component Value Date/Time   TSH 2.800 11/26/2017 0859   TSH 3.374 09/11/2014 0630    ASSESSMENT AND PLAN: Insulin resistance - Plan: metFORMIN (GLUCOPHAGE) 500 MG tablet  Class 2 severe obesity with serious comorbidity and body mass index (BMI) of 39.0 to 39.9 in adult, unspecified obesity type ([place] )  PLAN:  Insulin Resistance Cindy Phillips will continue to work on weight loss, diet, exercise, and decreasing simple carbohydrates in her diet to help decrease the risk of diabetes. We dicussed metformin including benefits and risks. She was informed that eating too many simple carbohydrates or too many calories at one sitting increases the likelihood of GI side effects. Cindy Phillips to  continue taking metformin 500 mg 2 tablets q AM and 1 tablet q PM #90 and we will refill for 1 month. Cindy Phillips to follow up with our clinic in 2 to 3 weeks as directed to monitor her progress.  Obesity Cindy Phillips is currently in the action stage of change. As such, her goal is to continue with weight loss efforts She has agreed to follow the Category 2 plan with breakfast options Cindy Phillips has been instructed to work up to a goal of 150 minutes of combined cardio and strengthening exercise per week for weight loss and overall health benefits. We discussed the following Behavioral Modification Strategies today: increasing lean protein intake and decreasing simple carbohydrates  Cindy Phillips has agreed to follow up with our clinic in 2 to 3 weeks. She was informed of the importance of frequent follow up visits to maximize her success with intensive lifestyle modifications for her multiple health conditions.   OBESITY BEHAVIORAL INTERVENTION VISIT  Today's visit was # 5 out of 22.  Starting weight: 289 lbs Starting date: 11/26/17 Today's weight : 283 lbs  Today's date: 04/02/2018 Total lbs lost to date: 6    ASK: We discussed the diagnosis of obesity with Cindy Phillips today and Cindy Phillips agreed to give [place] permission to discuss obesity behavioral modification therapy today.  ASSESS: Cindy Phillips has the diagnosis of obesity and her BMI today is 39.49 Cindy Phillips is in the action stage of change   ADVISE: Cindy Phillips was educated on the multiple health risks of obesity as well as the benefit of weight loss to improve her health. She was advised of the need for long term treatment and the importance of lifestyle modifications.  AGREE: Multiple dietary modification options and treatment options were discussed and  Cindy Phillips agreed to the above obesity treatment plan.  I, Trixie Dredge, am acting as transcriptionist for Dennard Nip, MD  I have reviewed the above documentation for accuracy and  completeness, and I agree with the above. -Dennard Nip, MD

## 2018-04-11 ENCOUNTER — Other Ambulatory Visit (INDEPENDENT_AMBULATORY_CARE_PROVIDER_SITE_OTHER): Payer: Self-pay | Admitting: Family Medicine

## 2018-04-14 ENCOUNTER — Other Ambulatory Visit: Payer: Self-pay

## 2018-04-14 NOTE — Patient Outreach (Signed)
[facility]) Care Management  04/14/2018  Cindy Phillips 01-09-1991 [id number]   Medication Adherence call to Cindy Phillips spoke with patient she said she already pick up Atorvastatin 10 mg for a 90 days supply.Cindy Phillips is showing past due under [place].   [place] Management Direct Dial [phone]  Fax [phone] [email or web address]

## 2018-04-23 ENCOUNTER — Encounter: Payer: Self-pay | Admitting: Pulmonary Disease

## 2018-04-23 ENCOUNTER — Ambulatory Visit (INDEPENDENT_AMBULATORY_CARE_PROVIDER_SITE_OTHER): Payer: Medicare Other | Admitting: Pulmonary Disease

## 2018-04-23 ENCOUNTER — Ambulatory Visit (INDEPENDENT_AMBULATORY_CARE_PROVIDER_SITE_OTHER): Payer: Medicare Other | Admitting: Family Medicine

## 2018-04-23 VITALS — BP 112/74 | HR 80 | Temp 98.0°F | Ht 71.0 in | Wt 278.0 lb

## 2018-04-23 DIAGNOSIS — Z6838 Body mass index (BMI) 38.0-38.9, adult: Secondary | ICD-10-CM | POA: Diagnosis not present

## 2018-04-23 DIAGNOSIS — E559 Vitamin D deficiency, unspecified: Secondary | ICD-10-CM

## 2018-04-23 DIAGNOSIS — E8881 Metabolic syndrome: Secondary | ICD-10-CM | POA: Diagnosis not present

## 2018-04-23 DIAGNOSIS — G4733 Obstructive sleep apnea (adult) (pediatric): Secondary | ICD-10-CM

## 2018-04-23 DIAGNOSIS — E119 Type 2 diabetes mellitus without complications: Secondary | ICD-10-CM | POA: Diagnosis not present

## 2018-04-23 MED ORDER — METFORMIN HCL 500 MG PO TABS: ORAL_TABLET | ORAL | 0 refills | Status: DC

## 2018-04-23 MED ORDER — VITAMIN D (ERGOCALCIFEROL) 1.25 MG (50000 UNIT) PO CAPS: 50000.0000 [IU] | ORAL_CAPSULE | ORAL | 0 refills | Status: DC

## 2018-04-23 NOTE — Progress Notes (Signed)
Office: [phone]  /  Fax: [phone]   HPI:   Chief Complaint: OBESITY Cindy Phillips is here to discuss her progress with her obesity treatment plan. She is on the Category 2 plan with breakfast options and is following her eating plan approximately 70 % of the time. She states she walks a lot for exercise. Cindy Phillips continues to do well with weight loss, but she is not eating all of her food on the category 2 plan. Cindy Phillips is still working on [facility]. Her weight is 278 lb (126.1 kg) today and has had a weight loss of 5 pounds over a period of 3 weeks since her last visit. She has lost 11 lbs since starting treatment with [place].  Insulin Resistance with hyperglycemia Cindy Phillips has a diagnosis of insulin resistance based on her elevated fasting insulin level >5. Although Cindy Phillips's blood glucose readings are still under good control, insulin resistance puts her at greater risk of metabolic syndrome and diabetes. She is taking metformin currently and continues to work on diet and exercise to decrease risk of diabetes. Cindy Phillips denies, nausea, vomiting or hypoglycemia.  Vitamin D deficiency Cindy Phillips has a diagnosis of vitamin D deficiency and she is due for labs. She is stable on vit D and denies nausea, vomiting or muscle weakness.   ALLERGIES: No Known Allergies  MEDICATIONS: Current Outpatient Medications on File Prior to Visit  Medication Sig Dispense Refill  . atorvastatin (LIPITOR) 10 MG tablet Take 10 mg by mouth daily.    . hydrOXYzine (ATARAX/VISTARIL) 50 MG tablet Take 1 tablet (50 mg total) by mouth every 4 (four) hours as needed for anxiety. 45 tablet 0  . lamoTRIgine (LAMICTAL) 25 MG tablet Take 1 tablet (25 mg total) by mouth daily. For mood stabilization 30 tablet 0  . medroxyPROGESTERone (DEPO-PROVERA) 150 MG/ML injection Inject 1 mL (150 mg total) into the muscle every 3 (three) months. For prevention of pregnancy 1 mL   . OLANZapine zydis (ZYPREXA) 15 MG disintegrating tablet  Take 1 tablet (15 mg total) by mouth at bedtime. For mood control 30 tablet 0  . sodium chloride (OCEAN) 0.65 % SOLN nasal spray Place 1 spray into both nostrils as needed for congestion.  0   No current facility-administered medications on file prior to visit.     PAST MEDICAL HISTORY: Past Medical History:  Diagnosis Date  . Bipolar 1 disorder ([place])   . Bowel incontinence   . Depression   . Mental disability     PAST SURGICAL HISTORY: Past Surgical History:  Procedure Laterality Date  . FOOT SURGERY      SOCIAL HISTORY: Social History   Tobacco Use  . Smoking status: Never Smoker  . Smokeless tobacco: Never Used  Substance Use Topics  . Alcohol use: No  . Drug use: No    FAMILY HISTORY: Family History  Problem Relation Age of Onset  . High blood pressure Mother   . Anxiety disorder Mother   . Diabetes Maternal Grandmother     ROS: Review of Systems  Constitutional: Positive for weight loss.  Gastrointestinal: Negative for nausea and vomiting.  Musculoskeletal:       Negative for muscle weakness  Endo/Heme/Allergies:       Negative for hypoglycemia Positive for hyperglycemia    PHYSICAL EXAM: Blood pressure 112/74, pulse 80, temperature 98 F (36.7 C), temperature source Oral, height 5\' 11"  (1.803 m), weight 278 lb (126.1 kg), SpO2 99 %. Body mass index is 38.77 kg/m. Physical Exam  Constitutional:  She is oriented to person, place, and time. She appears well-developed and well-nourished.  Cardiovascular: Normal rate.  Pulmonary/Chest: Effort normal.  Musculoskeletal: Normal range of motion.  Neurological: She is oriented to person, place, and time.  Skin: Skin is warm and dry.  Psychiatric: She has a normal mood and affect. Her behavior is normal.  Vitals reviewed.   RECENT LABS AND TESTS: BMET    Component Value Date/Time   NA 139 11/26/2017 0859   K 4.5 11/26/2017 0859   CL 103 11/26/2017 0859   CO2 20 11/26/2017 0859   GLUCOSE 94  11/26/2017 0859   GLUCOSE 99 09/09/2014 1846   BUN 9 11/26/2017 0859   CREATININE 0.84 11/26/2017 0859   CALCIUM 9.8 11/26/2017 0859   GFRNONAA 95 11/26/2017 0859   GFRAA 109 11/26/2017 0859   Lab Results  Component Value Date   HGBA1C 5.4 11/26/2017   HGBA1C 5.4 09/11/2014   Lab Results  Component Value Date   INSULIN 44.0 (H) 11/26/2017   CBC    Component Value Date/Time   WBC 6.4 11/26/2017 0859   WBC 6.9 09/09/2014 1846   RBC 4.42 11/26/2017 0859   RBC 3.85 (L) 09/09/2014 1846   HGB 13.0 11/26/2017 0859   HCT 38.8 11/26/2017 0859   PLT 172 09/09/2014 1846   MCV 88 11/26/2017 0859   MCH 29.4 11/26/2017 0859   MCH 30.1 09/09/2014 1846   MCHC 33.5 11/26/2017 0859   MCHC 34.0 09/09/2014 1846   RDW 13.7 11/26/2017 0859   LYMPHSABS 2.1 11/26/2017 0859   MONOABS 0.5 09/09/2014 1846   EOSABS 0.0 11/26/2017 0859   BASOSABS 0.0 11/26/2017 0859   Iron/TIBC/Ferritin/ %Sat No results found for: IRON, TIBC, FERRITIN, IRONPCTSAT Lipid Panel     Component Value Date/Time   CHOL 164 11/26/2017 0859   TRIG 117 11/26/2017 0859   HDL 44 11/26/2017 0859   CHOLHDL 4.1 09/11/2014 0630   VLDL 13 09/11/2014 0630   LDLCALC 97 11/26/2017 0859   Hepatic Function Panel     Component Value Date/Time   PROT 7.4 11/26/2017 0859   ALBUMIN 4.6 11/26/2017 0859   AST 14 11/26/2017 0859   ALT 13 11/26/2017 0859   ALKPHOS 114 11/26/2017 0859   BILITOT 0.3 11/26/2017 0859      Component Value Date/Time   TSH 2.800 11/26/2017 0859   TSH 3.374 09/11/2014 0630   Results for DRESDEN, AMENT (MRN [id number]) as of 04/23/2018 14:43  Ref. Range 11/26/2017 08:59  Vitamin D, 25-Hydroxy Latest Ref Range: 30.0 - 100.0 ng/mL 18.0 (L)   ASSESSMENT AND PLAN: Insulin resistance - Plan: metFORMIN (GLUCOPHAGE) 500 MG tablet, Comprehensive metabolic panel, Hemoglobin A1c, Insulin, random  Vitamin D deficiency - Plan: Vitamin D, Ergocalciferol, (DRISDOL) 50000 units CAPS capsule, VITAMIN D 25 Hydroxy  (Vit-D Deficiency, Fractures)  Class 2 severe obesity with serious comorbidity and body mass index (BMI) of 38.0 to 38.9 in adult, unspecified obesity type (HCC)  PLAN:  Insulin Resistance with hyperglycemia [name] will continue to work on weight loss, exercise, and decreasing simple carbohydrates in her diet to help decrease the risk of diabetes. We dicussed metformin including benefits and risks. She was informed that eating too many simple carbohydrates or too many calories at one sitting increases the likelihood of GI side effects. Karolina agreed to continue metformin for now and prescription was written today for 1 month refill. We will check labs and Lucresia agreed to follow up with [place] as directed to monitor her progress.  Vitamin D Deficiency Lakya was informed that low vitamin D levels contributes to fatigue and are associated with obesity, breast, and colon cancer. She agrees to continue to take prescription Vit D @50 ,000 IU every week #4 with no refills and will follow up for routine testing of vitamin D, at least 2-3 times per year. She was informed of the risk of over-replacement of vitamin D and agrees to not increase her dose unless she discusses this with [place] first. We will check labs and Lyliana agrees to follow up as directed.  Obesity Tyhesha is currently in the action stage of change. As such, her goal is to continue with weight loss efforts She has agreed to follow the Category 2 plan Shalice has been instructed to work up to a goal of 150 minutes of combined cardio and strengthening exercise per week for weight loss and overall health benefits. We discussed the following Behavioral Modification Strategies today: increasing lean protein intake, increasing vegetables and work on meal planning and easy cooking plans  Jeannemarie has agreed to follow up with our clinic in 3 weeks. She was informed of the importance of frequent follow up visits to maximize her success with intensive  lifestyle modifications for her multiple health conditions.   OBESITY BEHAVIORAL INTERVENTION VISIT  Today's visit was # 6   Starting weight: 289 lbs Starting date: 11/26/17 Today's weight : 278 lbs  Today's date: 04/23/2018 Total lbs lost to date: 11 At least 15 minutes were spent on discussing the following behavioral intervention visit.   ASK: We discussed the diagnosis of obesity with [name] today and Nyelle agreed to give [place] permission to discuss obesity behavioral modification therapy today.  ASSESS: Hester has the diagnosis of obesity and her BMI today is 38.79 Kamilya is in the action stage of change   ADVISE: Terrance was educated on the multiple health risks of obesity as well as the benefit of weight loss to improve her health. She was advised of the need for long term treatment and the importance of lifestyle modifications to improve her current health and to decrease her risk of future health problems.  AGREE: Multiple dietary modification options and treatment options were discussed and  Breda agreed to follow the recommendations documented in the above note.  ARRANGE: Merla was educated on the importance of frequent visits to treat obesity as outlined per CMS and USPSTF guidelines and agreed to schedule her next follow up appointment today.  I, Doreene Nest, am acting as transcriptionist for Dennard Nip, MD  I have reviewed the above documentation for accuracy and completeness, and I agree with the above. -Dennard Nip, MD

## 2018-04-23 NOTE — Patient Instructions (Signed)
We will try and obtain some information from DME company  To see if we are able to reduce CPAP pressures  We need to try CPAP for a while before saying it is not the appropriate treatment here  Other options of care will include referral to dental surgery for an oral device   I will see you back in about 3 months

## 2018-04-23 NOTE — Progress Notes (Signed)
Cindy Phillips    [id number]    [birth year]  Primary Care Physician:Bland, Myra Rude, MD  Referring Physician: Lucianne Lei, [address] [place], [place] [address]  Chief complaint:   History of obstructive sleep apnea Has not been compliant with treatment  HPI: Diagnosed with severe obstructive sleep apnea Could not tolerate CPAP She was prescribed CPAP therapy through her primary doctors Unclear at present blood pressure she is using but she states she cannot tolerate it   Outpatient Encounter Medications as of 04/23/2018  Medication Sig  . atorvastatin (LIPITOR) 10 MG tablet Take 10 mg by mouth daily.  . hydrOXYzine (ATARAX/VISTARIL) 50 MG tablet Take 1 tablet (50 mg total) by mouth every 4 (four) hours as needed for anxiety.  . lamoTRIgine (LAMICTAL) 25 MG tablet Take 1 tablet (25 mg total) by mouth daily. For mood stabilization  . medroxyPROGESTERone (DEPO-PROVERA) 150 MG/ML injection Inject 1 mL (150 mg total) into the muscle every 3 (three) months. For prevention of pregnancy  . metFORMIN (GLUCOPHAGE) 500 MG tablet TAKE 2 TABLETS EVERY MORNING AND TAKE 1 TABLET EVERY EVENING  . OLANZapine zydis (ZYPREXA) 15 MG disintegrating tablet Take 1 tablet (15 mg total) by mouth at bedtime. For mood control  . sodium chloride (OCEAN) 0.65 % SOLN nasal spray Place 1 spray into both nostrils as needed for congestion.  . Vitamin D, Ergocalciferol, (DRISDOL) 50000 units CAPS capsule Take 1 capsule (50,000 Units total) by mouth every 7 (seven) days.  . [DISCONTINUED] metFORMIN (GLUCOPHAGE) 500 MG tablet TAKE 2 TABLETS EVERY MORNING AND TAKE 1 TABLET EVERY EVENING  . [DISCONTINUED] Vitamin D, Ergocalciferol, (DRISDOL) 50000 units CAPS capsule Take 1 capsule (50,000 Units total) by mouth every 7 (seven) days.   No facility-administered encounter medications on file as of 04/23/2018.     Allergies as of 04/23/2018  . (No Known Allergies)    Past Medical History:  Diagnosis  Date  . Bipolar 1 disorder ([place])   . Bowel incontinence   . Depression   . Mental disability     Past Surgical History:  Procedure Laterality Date  . FOOT SURGERY      Family History  Problem Relation Age of Onset  . High blood pressure Mother   . Anxiety disorder Mother   . Diabetes Maternal Grandmother     Social History   Socioeconomic History  . Marital status: Single    Spouse name: Not on file  . Number of children: 0  . Years of education: Not on file  . Highest education level: Not on file  Occupational History  . Occupation: Clinical research associate     Comment: [employer]   Social Needs  . Financial resource strain: Not on file  . Food insecurity:    Worry: Not on file    Inability: Not on file  . Transportation needs:    Medical: Not on file    Non-medical: Not on file  Tobacco Use  . Smoking status: Never Smoker  . Smokeless tobacco: Never Used  Substance and Sexual Activity  . Alcohol use: No  . Drug use: No  . Sexual activity: Not Currently  Lifestyle  . Physical activity:    Days per week: Not on file    Minutes per session: Not on file  . Stress: Not on file  Relationships  . Social connections:    Talks on phone: Not on file    Gets together: Not on  file    Attends religious service: Not on file    Active member of club or organization: Not on file    Attends meetings of clubs or organizations: Not on file    Relationship status: Not on file  . Intimate partner violence:    Fear of current or ex partner: Not on file    Emotionally abused: Not on file    Physically abused: Not on file    Forced sexual activity: Not on file  Other Topics Concern  . Not on file  Social History Narrative  . Not on file    Review of Systems  Constitutional: Negative.   HENT: Negative.   Eyes: Negative.   Respiratory: Positive for apnea.   Cardiovascular: Negative.   Gastrointestinal: Negative.   Endocrine: Negative.   Genitourinary: Negative.     Psychiatric/Behavioral: Positive for behavioral problems, dysphoric mood and sleep disturbance.    Vitals:   04/23/18 1428  BP: (!) 148/72  Pulse: (!) 116  SpO2: 98%     Physical Exam  Constitutional: She is oriented to person, place, and time. She appears well-developed.  obese  HENT:  Head: Normocephalic and atraumatic.  Eyes: Pupils are equal, round, and reactive to light. Conjunctivae and EOM are normal. Right eye exhibits no discharge. Left eye exhibits no discharge.  Neck: Normal range of motion. Neck supple. No tracheal deviation present. No thyromegaly present.  Cardiovascular: Normal rate and regular rhythm.  Pulmonary/Chest: Effort normal and breath sounds normal. No respiratory distress. She has no wheezes.  Abdominal: Soft. Bowel sounds are normal. She exhibits no distension. There is no tenderness.  Musculoskeletal: Normal range of motion. She exhibits no edema or deformity.  Neurological: She is alert and oriented to person, place, and time. She has normal reflexes. No cranial nerve deficit.  Skin: Skin is warm and dry. No erythema.   Data Reviewed: Polysomnogram reviewed showing severe obstructive sleep apnea  Assessment:  .  Obstructive sleep apnea-noncompliant with treatment .  Noncompliance .  History of bipolar disorder  Plan/Recommendations:  .  Obtain more information from primary doctor's  .  Patient cannot provide any significant history with respect to what she is on regarding CPAP therapy at present, no information on the computer system as well regarding current pressure settings  .  We did discuss the need to treat a sleep apnea  .  The physiology of obstructive sleep apnea discussed  .  Treatment options disordered breathing discussed  .  Importance of weight loss and exercise discussed  Tentatively scheduled for 3 months follow-up The plan will be to reduce CPAP pressure settings If she is not able to tolerate CPAP, referral for  evaluation for an oral device was discussed   Sherrilyn Rist MD  Pulmonary and Critical Care 04/23/2018, 2:52 PM  CC: Lucianne Lei, MD

## 2018-04-24 LAB — COMPREHENSIVE METABOLIC PANEL
A/G RATIO: 1.6 (ref 1.2–2.2)
ALBUMIN: 4.6 g/dL (ref 3.5–5.5)
ALT: 14 IU/L (ref 0–32)
AST: 17 IU/L (ref 0–40)
Alkaline Phosphatase: 113 IU/L (ref 39–117)
BUN/Creatinine Ratio: 7 — ABNORMAL LOW (ref 9–23)
BUN: 6 mg/dL (ref 6–20)
Bilirubin Total: 0.3 mg/dL (ref 0.0–1.2)
CO2: 19 mmol/L — ABNORMAL LOW (ref 20–29)
Calcium: 9.5 mg/dL (ref 8.7–10.2)
Chloride: 103 mmol/L (ref 96–106)
Creatinine, Ser: 0.81 mg/dL (ref 0.57–1.00)
GFR, EST AFRICAN AMERICAN: 114 mL/min/{1.73_m2} (ref >59)
GFR, EST NON AFRICAN AMERICAN: 99 mL/min/{1.73_m2} (ref >59)
Globulin, Total: 2.8 g/dL (ref 1.5–4.5)
Glucose: 87 mg/dL (ref 65–99)
Potassium: 4 mmol/L (ref 3.5–5.2)
Sodium: 140 mmol/L (ref 134–144)
TOTAL PROTEIN: 7.4 g/dL (ref 6.0–8.5)

## 2018-04-24 LAB — HEMOGLOBIN A1C
Est. average glucose Bld gHb Est-mCnc: 105 mg/dL
Hgb A1c MFr Bld: 5.3 % (ref 4.8–5.6)

## 2018-04-24 LAB — INSULIN, RANDOM: INSULIN: 31.2 u[IU]/mL — ABNORMAL HIGH (ref 2.6–24.9)

## 2018-04-24 LAB — VITAMIN D 25 HYDROXY (VIT D DEFICIENCY, FRACTURES): VIT D 25 HYDROXY: 41.9 ng/mL (ref 30.0–100.0)

## 2018-05-08 DIAGNOSIS — E119 Type 2 diabetes mellitus without complications: Secondary | ICD-10-CM | POA: Diagnosis not present

## 2018-05-08 DIAGNOSIS — R1010 Upper abdominal pain, unspecified: Secondary | ICD-10-CM | POA: Diagnosis not present

## 2018-05-14 ENCOUNTER — Other Ambulatory Visit: Payer: Self-pay | Admitting: Family Medicine

## 2018-05-14 ENCOUNTER — Ambulatory Visit (INDEPENDENT_AMBULATORY_CARE_PROVIDER_SITE_OTHER): Payer: Medicare Other | Admitting: Family Medicine

## 2018-05-14 ENCOUNTER — Telehealth: Payer: Self-pay | Admitting: Pulmonary Disease

## 2018-05-14 VITALS — BP 105/72 | HR 88 | Temp 98.1°F | Ht 71.0 in | Wt 280.0 lb

## 2018-05-14 DIAGNOSIS — E8881 Metabolic syndrome: Secondary | ICD-10-CM | POA: Diagnosis not present

## 2018-05-14 DIAGNOSIS — K5900 Constipation, unspecified: Secondary | ICD-10-CM | POA: Diagnosis not present

## 2018-05-14 DIAGNOSIS — Z6839 Body mass index (BMI) 39.0-39.9, adult: Secondary | ICD-10-CM | POA: Diagnosis not present

## 2018-05-14 MED ORDER — METFORMIN HCL 500 MG PO TABS: ORAL_TABLET | ORAL | 0 refills | Status: DC

## 2018-05-14 NOTE — Telephone Encounter (Signed)
Patient is calling to give [place] the company name that she gets her cpap supplies from  [facility] [phone].I have called [facility] to obtain her complaince record and also her current setting, I had to leave a message and asked for a call back.

## 2018-05-15 NOTE — Progress Notes (Signed)
Office: [phone]  /  Fax: [phone]   HPI:   Chief Complaint: OBESITY Cindy Phillips is here to discuss her progress with her obesity treatment plan. She is on the Category 2 plan and is following her eating plan approximately 65 % of the time. She states she is exercising 0 minutes 0 times per week. Cindy Phillips continues to struggles with weight loss. She says she likes her Category 2 plan but is struggling to follow closely. She stopped exercising a couple of days ago due to abdominal pain which she is having worked up at her primary care physician office.  Her weight is 280 lb (127 kg) today and has gained 2 pounds since her last visit. She has lost 9 lbs since starting treatment with [place].  Insulin Resistance Cindy Phillips has a diagnosis of insulin resistance based on her elevated fasting insulin level >5. Although Cindy Phillips's blood glucose readings are still under good control, insulin resistance puts her at greater risk of metabolic syndrome and diabetes. She is doing well on her metformin. Her A1c is well controlled and her fating insulin is improving. She continues to work on diet and exercise to decrease risk of diabetes. She denies hypoglycemia.  ALLERGIES: No Known Allergies  MEDICATIONS: Current Outpatient Medications on File Prior to Visit  Medication Sig Dispense Refill  . atorvastatin (LIPITOR) 10 MG tablet Take 10 mg by mouth daily.    . hydrOXYzine (ATARAX/VISTARIL) 50 MG tablet Take 1 tablet (50 mg total) by mouth every 4 (four) hours as needed for anxiety. 45 tablet 0  . lamoTRIgine (LAMICTAL) 25 MG tablet Take 1 tablet (25 mg total) by mouth daily. For mood stabilization 30 tablet 0  . medroxyPROGESTERone (DEPO-PROVERA) 150 MG/ML injection Inject 1 mL (150 mg total) into the muscle every 3 (three) months. For prevention of pregnancy 1 mL   . OLANZapine zydis (ZYPREXA) 15 MG disintegrating tablet Take 1 tablet (15 mg total) by mouth at bedtime. For mood control 30 tablet 0  . sodium  chloride (OCEAN) 0.65 % SOLN nasal spray Place 1 spray into both nostrils as needed for congestion.  0  . Vitamin D, Ergocalciferol, (DRISDOL) 50000 units CAPS capsule Take 1 capsule (50,000 Units total) by mouth every 7 (seven) days. 4 capsule 0   No current facility-administered medications on file prior to visit.     PAST MEDICAL HISTORY: Past Medical History:  Diagnosis Date  . Bipolar 1 disorder ([place])   . Bowel incontinence   . Depression   . Mental disability     PAST SURGICAL HISTORY: Past Surgical History:  Procedure Laterality Date  . FOOT SURGERY      SOCIAL HISTORY: Social History   Tobacco Use  . Smoking status: Never Smoker  . Smokeless tobacco: Never Used  Substance Use Topics  . Alcohol use: No  . Drug use: No    FAMILY HISTORY: Family History  Problem Relation Age of Onset  . High blood pressure Mother   . Anxiety disorder Mother   . Diabetes Maternal Grandmother     ROS: Review of Systems  Constitutional: Negative for weight loss.  Gastrointestinal: Positive for abdominal pain.  Endo/Heme/Allergies:       Negative hypoglycemia    PHYSICAL EXAM: Blood pressure 105/72, pulse 88, temperature 98.1 F (36.7 C), temperature source Oral, height 5\' 11"  (1.803 m), weight 280 lb (127 kg), SpO2 97 %. Body mass index is 39.05 kg/m. Physical Exam  Constitutional: She is oriented to person, place, and time. She  appears well-developed and well-nourished.  Cardiovascular: Normal rate.  Pulmonary/Chest: Effort normal.  Musculoskeletal: Normal range of motion.  Neurological: She is oriented to person, place, and time.  Skin: Skin is warm and dry.  Psychiatric: She has a normal mood and affect. Her behavior is normal.  Vitals reviewed.   RECENT LABS AND TESTS: BMET    Component Value Date/Time   NA 140 04/23/2018 1234   K 4.0 04/23/2018 1234   CL 103 04/23/2018 1234   CO2 19 (L) 04/23/2018 1234   GLUCOSE 87 04/23/2018 1234   GLUCOSE 99 09/09/2014  1846   BUN 6 04/23/2018 1234   CREATININE 0.81 04/23/2018 1234   CALCIUM 9.5 04/23/2018 1234   GFRNONAA 99 04/23/2018 1234   GFRAA 114 04/23/2018 1234   Lab Results  Component Value Date   HGBA1C 5.3 04/23/2018   HGBA1C 5.4 11/26/2017   HGBA1C 5.4 09/11/2014   Lab Results  Component Value Date   INSULIN 31.2 (H) 04/23/2018   INSULIN 44.0 (H) 11/26/2017   CBC    Component Value Date/Time   WBC 6.4 11/26/2017 0859   WBC 6.9 09/09/2014 1846   RBC 4.42 11/26/2017 0859   RBC 3.85 (L) 09/09/2014 1846   HGB 13.0 11/26/2017 0859   HCT 38.8 11/26/2017 0859   PLT 172 09/09/2014 1846   MCV 88 11/26/2017 0859   MCH 29.4 11/26/2017 0859   MCH 30.1 09/09/2014 1846   MCHC 33.5 11/26/2017 0859   MCHC 34.0 09/09/2014 1846   RDW 13.7 11/26/2017 0859   LYMPHSABS 2.1 11/26/2017 0859   MONOABS 0.5 09/09/2014 1846   EOSABS 0.0 11/26/2017 0859   BASOSABS 0.0 11/26/2017 0859   Iron/TIBC/Ferritin/ %Sat No results found for: IRON, TIBC, FERRITIN, IRONPCTSAT Lipid Panel     Component Value Date/Time   CHOL 164 11/26/2017 0859   TRIG 117 11/26/2017 0859   HDL 44 11/26/2017 0859   CHOLHDL 4.1 09/11/2014 0630   VLDL 13 09/11/2014 0630   LDLCALC 97 11/26/2017 0859   Hepatic Function Panel     Component Value Date/Time   PROT 7.4 04/23/2018 1234   ALBUMIN 4.6 04/23/2018 1234   AST 17 04/23/2018 1234   ALT 14 04/23/2018 1234   ALKPHOS 113 04/23/2018 1234   BILITOT 0.3 04/23/2018 1234      Component Value Date/Time   TSH 2.800 11/26/2017 0859   TSH 3.374 09/11/2014 0630    ASSESSMENT AND PLAN: Insulin resistance - Plan: metFORMIN (GLUCOPHAGE) 500 MG tablet  Class 2 severe obesity with serious comorbidity and body mass index (BMI) of 39.0 to 39.9 in adult, unspecified obesity type ([place])  PLAN:  Insulin Resistance Cindy Phillips Cindy Phillips continue to work on weight loss, diet, exercise, and decreasing simple carbohydrates in her diet to help decrease the risk of diabetes. We dicussed  metformin including benefits and risks. She was informed that eating too many simple carbohydrates or too many calories at one sitting increases the likelihood of GI side effects. Cindy Phillips agrees to continue taking metformin 500 mg qhs #30 and we Cindy Phillips refill for 1 month. Cindy Phillips agrees to follow up with our clinic in 3 weeks as directed to monitor her progress.  Obesity Cindy Phillips is currently in the action stage of change. As such, her goal is to continue with weight loss efforts She has agreed to follow the Category 2 plan Cindy Phillips has been instructed to work up to a goal of 150 minutes of combined cardio and strengthening exercise per week for weight loss and  overall health benefits. We discussed the following Behavioral Modification Strategies today: increasing lean protein intake and work on meal planning and easy cooking plans   Cindy Phillips has agreed to follow up with our clinic in 3 weeks. She was informed of the importance of frequent follow up visits to maximize her success with intensive lifestyle modifications for her multiple health conditions.   OBESITY BEHAVIORAL INTERVENTION VISIT  Today's visit was # 7   Starting weight: 289 lbs Starting date: 11/26/17 Today's weight : 280 lbs  Today's date: 05/14/2018 Total lbs lost to date: 9 At least 15 minutes were spent on discussing the following behavioral intervention visit.   ASK: We discussed the diagnosis of obesity with Cindy Phillips today and Cindy Phillips agreed to give [place] permission to discuss obesity behavioral modification therapy today.  ASSESS: Cindy Phillips has the diagnosis of obesity and her BMI today is 39.07 Cindy Phillips is in the action stage of change   ADVISE: Cindy Phillips was educated on the multiple health risks of obesity as well as the benefit of weight loss to improve her health. She was advised of the need for long term treatment and the importance of lifestyle modifications to improve her current health and to decrease her risk  of future health problems.  AGREE: Multiple dietary modification options and treatment options were discussed and  Cindy Phillips agreed to follow the recommendations documented in the above note.  ARRANGE: Cindy Phillips was educated on the importance of frequent visits to treat obesity as outlined per CMS and USPSTF guidelines and agreed to schedule her next follow up appointment today.  I, Trixie Dredge, am acting as transcriptionist for Dennard Nip, MD  I have reviewed the above documentation for accuracy and completeness, and I agree with the above. -Dennard Nip, MD

## 2018-05-15 NOTE — Telephone Encounter (Signed)
Called and spoke to Richey with [employer].  I have requested that [facility] fax over compliance report and settings. Aldona Bar stated that it appears that either pt's machine is not working or she has used it recently.   Will leave message in triage until DL is received.

## 2018-05-16 NOTE — Telephone Encounter (Signed)
Called and spoke to [place] with [employer].  I have requested that [facility] fax over compliance report and settings. Benita Gutter stated that it appears that either pt's machine is not working or she has used it recently.   We have received this I will place this in AO look at folder.

## 2018-05-19 ENCOUNTER — Ambulatory Visit
Admission: RE | Admit: 2018-05-19 | Discharge: 2018-05-19 | Disposition: A | Discharge status: Home / Self Care | Payer: Medicare Other | Source: Ambulatory Visit | Attending: Family Medicine | Admitting: Family Medicine

## 2018-05-19 DIAGNOSIS — K5900 Constipation, unspecified: Secondary | ICD-10-CM | POA: Diagnosis not present

## 2018-05-19 IMAGING — CT CT ABD-PELV W/ CM
2 of 4 series · 14 of 46 positions shown, 16 images · IV contrast (iopamidol)
Comparison: KUB [DATE]

CLINICAL DATA: Constipation.  Abdominal pain 2-3 weeks

EXAM:
CT ABDOMEN AND PELVIS WITH CONTRAST
TECHNIQUE: Multidetector CT imaging of the abdomen and pelvis was performed
using the standard protocol following bolus administration of
intravenous contrast.
CONTRAST:  125mL [B3] IOPAMIDOL ([B3]) INJECTION 61%

[Series 2: abd pelvis 5.00 br40 s3 ax · axial · 0.68mm/px · z∈[+1165,+1600]mm · 11 of 105 slices shown, 13 images]
[im 9/105  soft-tissue]
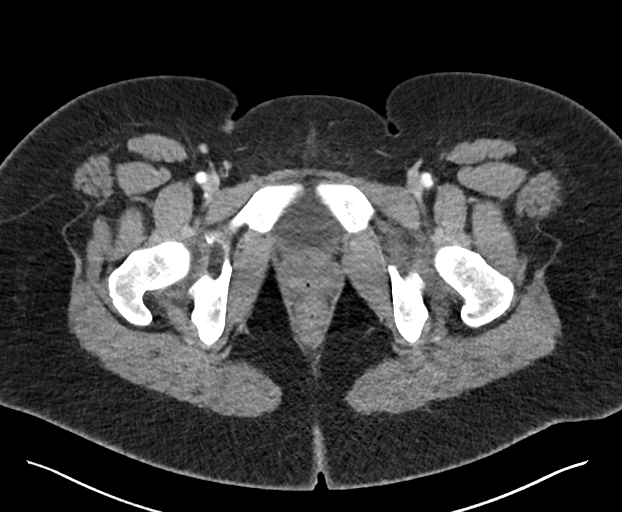
[im 9/105  bone]
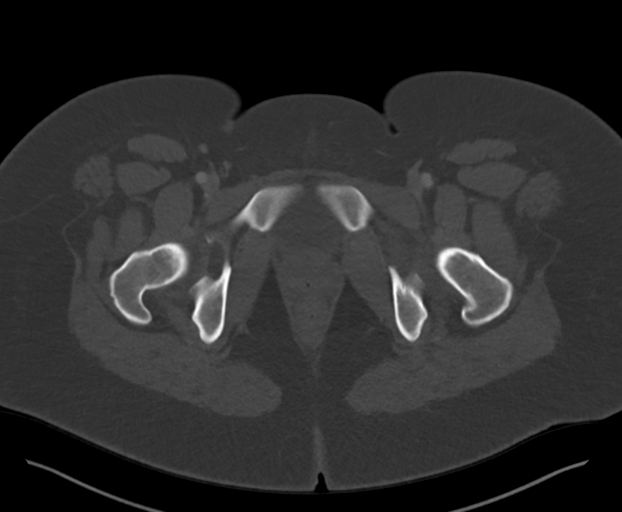
[im 17/105  soft-tissue]
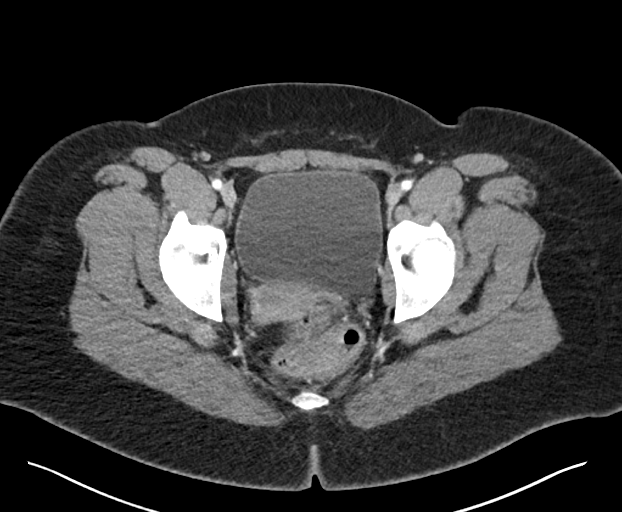
[im 25/105  soft-tissue]
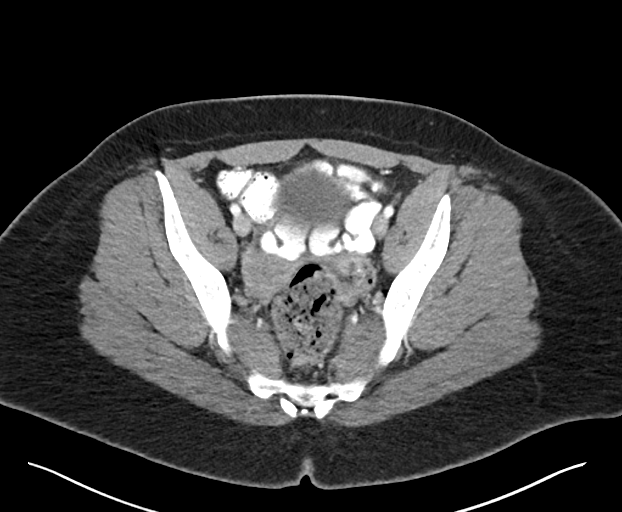
[im 34/105  soft-tissue]
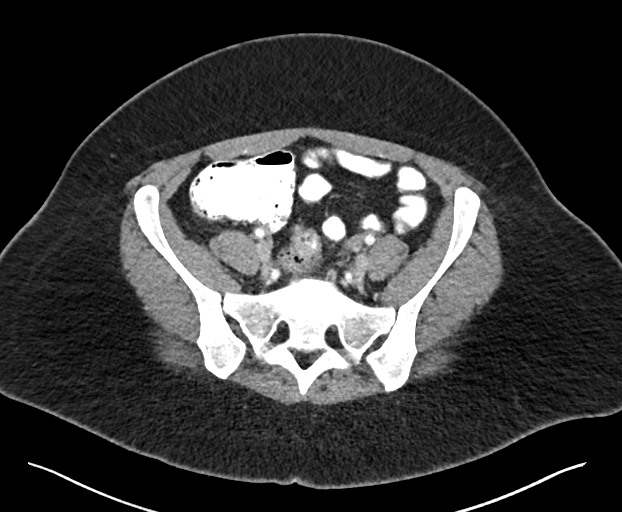
[im 42/105  soft-tissue]
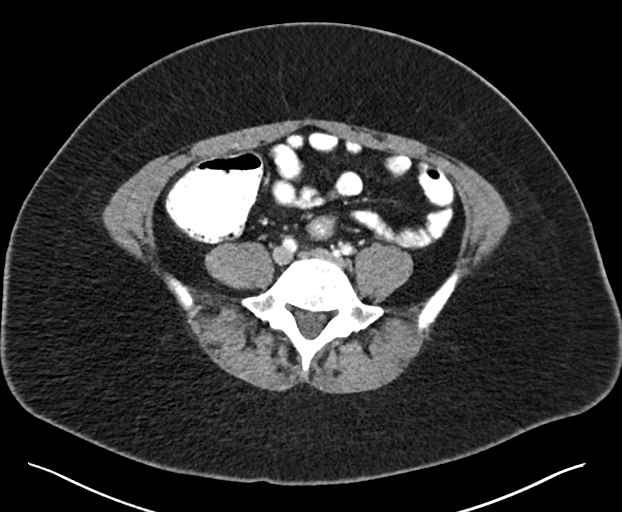
[im 55/105  soft-tissue]
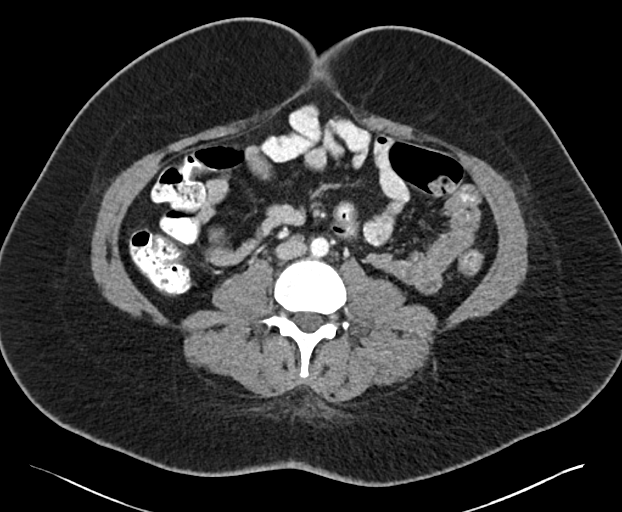
[im 63/105  soft-tissue]
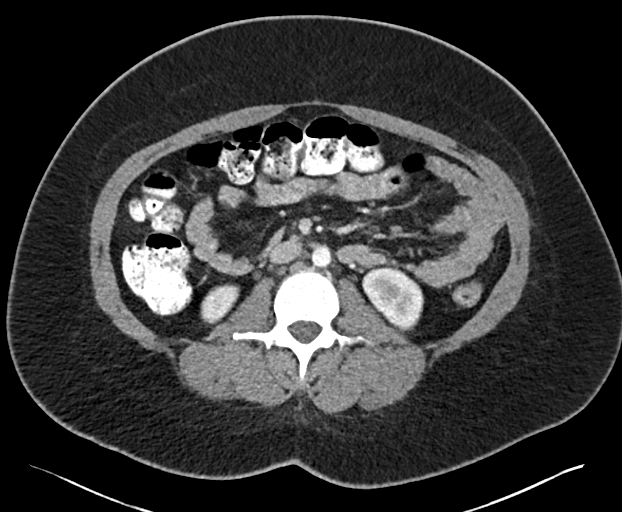
[im 71/105  soft-tissue]
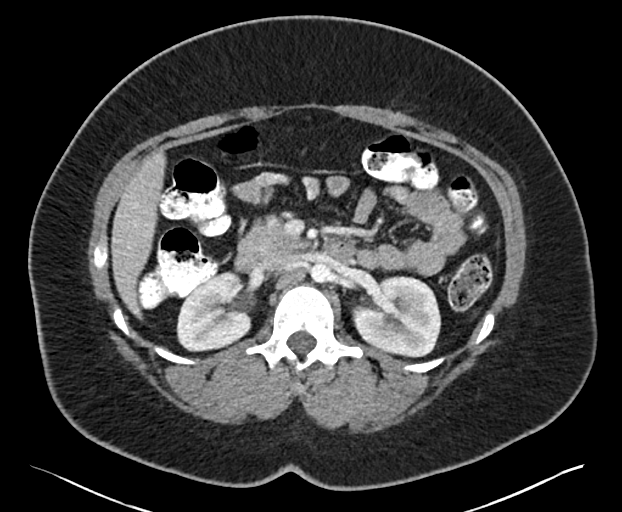
[im 80/105  soft-tissue]
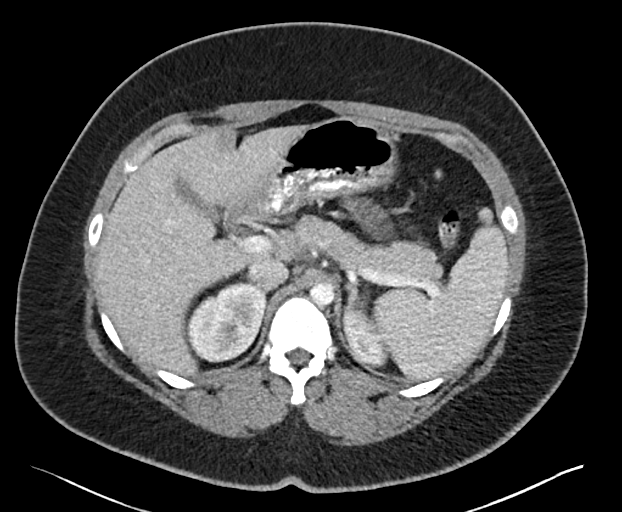
[im 80/105  bone]
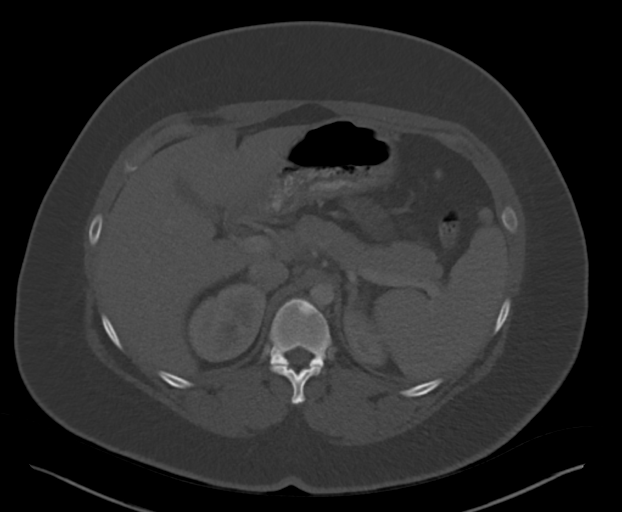
[im 88/105  soft-tissue]
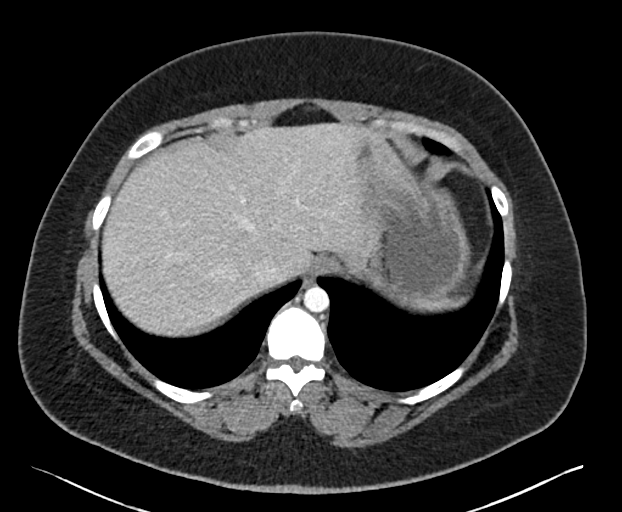
[im 96/105  soft-tissue]
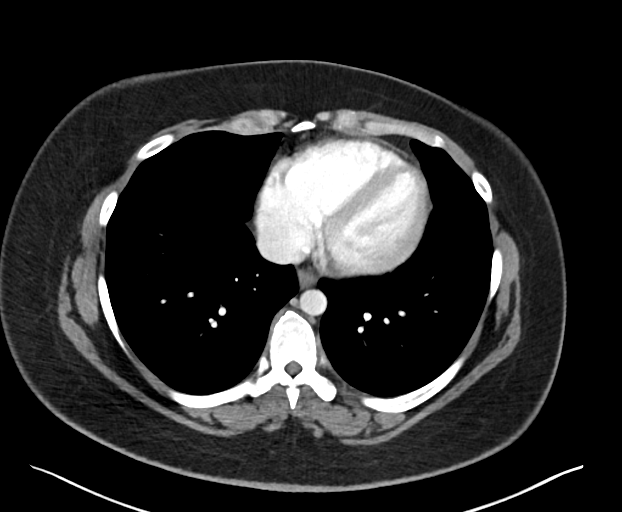

[Series 6: abd pelvis 2.00 br40 s3 cor · coronal · 0.82mm/px · 3 of 172 slices shown]
[im 58/172  soft-tissue]
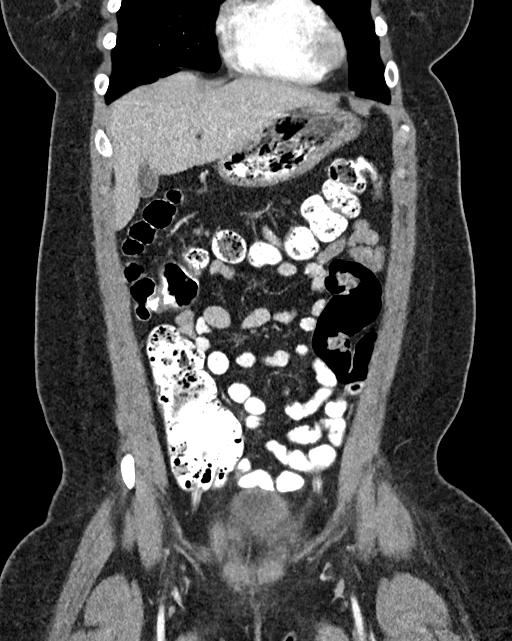
[im 77/172  soft-tissue]
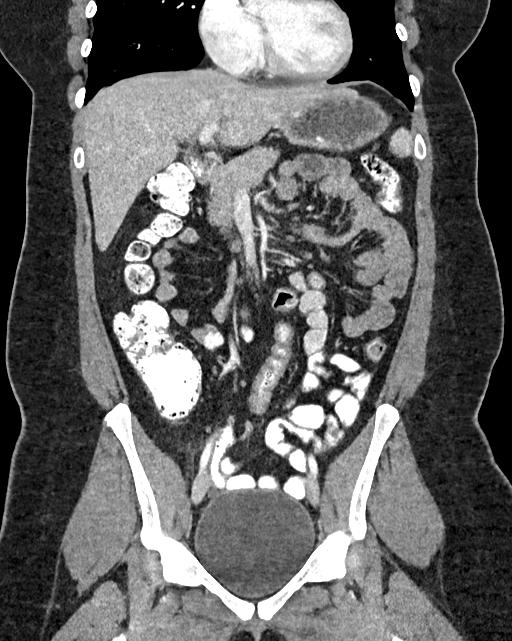
[im 96/172  soft-tissue]
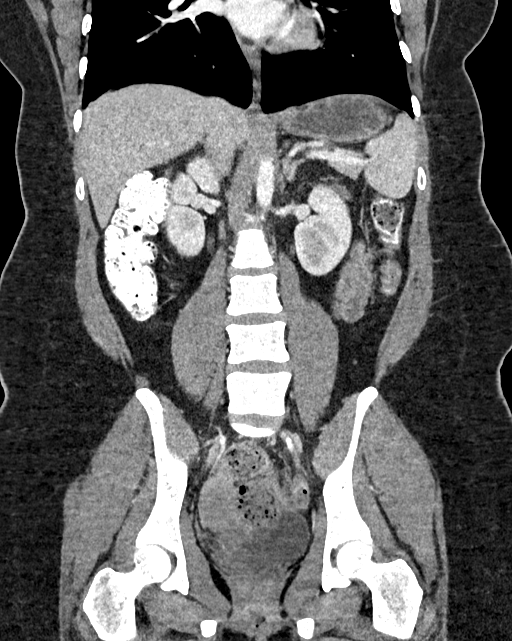

[14 of 46 positions shown; findings below may reference images not displayed]

FINDINGS: Lower chest: Lung bases clear bilaterally.

Hepatobiliary: Negative for liver mass. Focal fatty liver adjacent
to the falciform ligament. Normal gallbladder and bile ducts.

Pancreas: Negative

Spleen: Negative

Adrenals/Urinary Tract: Negative

Stomach/Bowel: Negative for bowel obstruction. Negative for bowel
mass or edema.

Vascular/Lymphatic: Negative for vascular calcification. Negative
for lymphadenopathy

Reproductive: Negative for pelvic mass. Normal uterus and adnexa.
Uterus to the right of midline.

Other: No free fluid. Periumbilical hernia containing fat and small
bowel loops. No edema or fluid

Musculoskeletal: Negative
IMPRESSION: No acute abnormality.  Mild amount of stool in the colon

2 x 3 cm hypodensity in the liver adjacent the falciform ligament
compatible with focal fatty infiltration.

## 2018-05-19 MED ORDER — IOPAMIDOL (ISOVUE-300) INJECTION 61%
125.0000 mL | Freq: Once | INTRAVENOUS | Status: AC | PRN
  Administered 2018-05-19: 125 mL via INTRAVENOUS

## 2018-05-22 ENCOUNTER — Other Ambulatory Visit (INDEPENDENT_AMBULATORY_CARE_PROVIDER_SITE_OTHER): Payer: Self-pay | Admitting: Family Medicine

## 2018-05-22 DIAGNOSIS — E559 Vitamin D deficiency, unspecified: Secondary | ICD-10-CM

## 2018-05-23 DIAGNOSIS — M25571 Pain in right ankle and joints of right foot: Secondary | ICD-10-CM | POA: Diagnosis not present

## 2018-05-23 DIAGNOSIS — M65871 Other synovitis and tenosynovitis, right ankle and foot: Secondary | ICD-10-CM | POA: Diagnosis not present

## 2018-05-23 DIAGNOSIS — M12271 Villonodular synovitis (pigmented), right ankle and foot: Secondary | ICD-10-CM | POA: Diagnosis not present

## 2018-05-28 DIAGNOSIS — R1010 Upper abdominal pain, unspecified: Secondary | ICD-10-CM | POA: Diagnosis not present

## 2018-05-28 DIAGNOSIS — K5900 Constipation, unspecified: Secondary | ICD-10-CM | POA: Diagnosis not present

## 2018-06-04 ENCOUNTER — Telehealth: Payer: Self-pay | Admitting: Pulmonary Disease

## 2018-06-04 ENCOUNTER — Ambulatory Visit (INDEPENDENT_AMBULATORY_CARE_PROVIDER_SITE_OTHER): Payer: Medicare Other | Admitting: Family Medicine

## 2018-06-04 VITALS — BP 108/74 | HR 96 | Temp 98.0°F | Ht 71.0 in | Wt 282.0 lb

## 2018-06-04 DIAGNOSIS — G4733 Obstructive sleep apnea (adult) (pediatric): Secondary | ICD-10-CM

## 2018-06-04 DIAGNOSIS — E8881 Metabolic syndrome: Secondary | ICD-10-CM

## 2018-06-04 DIAGNOSIS — E559 Vitamin D deficiency, unspecified: Secondary | ICD-10-CM | POA: Diagnosis not present

## 2018-06-04 DIAGNOSIS — Z6839 Body mass index (BMI) 39.0-39.9, adult: Secondary | ICD-10-CM | POA: Diagnosis not present

## 2018-06-04 MED ORDER — VITAMIN D (ERGOCALCIFEROL) 1.25 MG (50000 UNIT) PO CAPS: 50000.0000 [IU] | ORAL_CAPSULE | ORAL | 0 refills | Status: DC

## 2018-06-04 MED ORDER — METFORMIN HCL 500 MG PO TABS: ORAL_TABLET | ORAL | 0 refills | Status: DC

## 2018-06-04 NOTE — Telephone Encounter (Signed)
Spoke with patient-states she was told by AO he wanted her to use her CPAP but needed to check for correct settings and advise her DME what settings she should be at. AO and Heather-please advise if you guys rec'd the DL and what pressure(s) patient needs to be on. Thanks.

## 2018-06-05 NOTE — Progress Notes (Signed)
Office: [phone]  /  Fax: [phone]   HPI:   Chief Complaint: OBESITY Cindy Phillips is here to discuss her progress with her obesity treatment plan. She is on the Category 2 plan and is following her eating plan approximately 60 % of the time. She states she is exercising 0 minutes 0 times per week. Cindy Phillips eats what her grandmother cooks and her grandmother deep fries all the meat. She is trying to increase vegetables, but not getting much family support.  Her weight is 282 lb (127.9 kg) today and has gained 2 pounds since her last visit. She has lost 7 lbs since starting treatment with [place].  Insulin Resistance Cindy Phillips has a diagnosis of insulin resistance based on her elevated fasting insulin level >5. Although Cindy Phillips's blood glucose readings are still under good control, insulin resistance puts her at greater risk of metabolic syndrome and diabetes. She is stable on metformin and diet, continue to work on exercise to decrease risk of diabetes. She denies nausea, vomiting, hypoglycemia.  Vitamin D Deficiency Cindy Phillips has a diagnosis of vitamin D deficiency. She is stable on prescription Vit D, but level is not yet at goal. She denies nausea, vomiting or muscle weakness.  Obstructive Sleep Apnea Cindy Phillips is still working on her CPAP machine. She spends approximately 10 hours per night in bed, but still wakes up unrefreshed and has daytime somnolence.  ALLERGIES: No Known Allergies  MEDICATIONS: Current Outpatient Medications on File Prior to Visit  Medication Sig Dispense Refill  . atorvastatin (LIPITOR) 10 MG tablet Take 10 mg by mouth daily.    . hydrOXYzine (ATARAX/VISTARIL) 50 MG tablet Take 1 tablet (50 mg total) by mouth every 4 (four) hours as needed for anxiety. 45 tablet 0  . lamoTRIgine (LAMICTAL) 25 MG tablet Take 1 tablet (25 mg total) by mouth daily. For mood stabilization 30 tablet 0  . medroxyPROGESTERone (DEPO-PROVERA) 150 MG/ML injection Inject 1 mL (150 mg total)  into the muscle every 3 (three) months. For prevention of pregnancy 1 mL   . OLANZapine zydis (ZYPREXA) 15 MG disintegrating tablet Take 1 tablet (15 mg total) by mouth at bedtime. For mood control 30 tablet 0  . sodium chloride (OCEAN) 0.65 % SOLN nasal spray Place 1 spray into both nostrils as needed for congestion.  0   No current facility-administered medications on file prior to visit.     PAST MEDICAL HISTORY: Past Medical History:  Diagnosis Date  . Bipolar 1 disorder ([place])   . Bowel incontinence   . Depression   . Mental disability     PAST SURGICAL HISTORY: Past Surgical History:  Procedure Laterality Date  . FOOT SURGERY      SOCIAL HISTORY: Social History   Tobacco Use  . Smoking status: Never Smoker  . Smokeless tobacco: Never Used  Substance Use Topics  . Alcohol use: No  . Drug use: No    FAMILY HISTORY: Family History  Problem Relation Age of Onset  . High blood pressure Mother   . Anxiety disorder Mother   . Diabetes Maternal Grandmother     ROS: Review of Systems  Constitutional: Negative for weight loss.  Gastrointestinal: Negative for nausea and vomiting.  Musculoskeletal:       Negative muscle weakness  Endo/Heme/Allergies:       Negative hypoglycemia    PHYSICAL EXAM: Blood pressure 108/74, pulse 96, temperature 98 F (36.7 C), temperature source Oral, height 5\' 11"  (1.803 m), weight 282 lb (127.9 kg), SpO2 98 %.  Body mass index is 39.33 kg/m. Physical Exam  Constitutional: She is oriented to person, place, and time. She appears well-developed and well-nourished.  Cardiovascular: Normal rate.  Pulmonary/Chest: Effort normal.  Musculoskeletal: Normal range of motion.  Neurological: She is oriented to person, place, and time.  Skin: Skin is warm and dry.  Psychiatric: She has a normal mood and affect. Her behavior is normal.  Vitals reviewed.   RECENT LABS AND TESTS: BMET    Component Value Date/Time   NA 140 04/23/2018 1234    K 4.0 04/23/2018 1234   CL 103 04/23/2018 1234   CO2 19 (L) 04/23/2018 1234   GLUCOSE 87 04/23/2018 1234   GLUCOSE 99 09/09/2014 1846   BUN 6 04/23/2018 1234   CREATININE 0.81 04/23/2018 1234   CALCIUM 9.5 04/23/2018 1234   GFRNONAA 99 04/23/2018 1234   GFRAA 114 04/23/2018 1234   Lab Results  Component Value Date   HGBA1C 5.3 04/23/2018   HGBA1C 5.4 11/26/2017   HGBA1C 5.4 09/11/2014   Lab Results  Component Value Date   INSULIN 31.2 (H) 04/23/2018   INSULIN 44.0 (H) 11/26/2017   CBC    Component Value Date/Time   WBC 6.4 11/26/2017 0859   WBC 6.9 09/09/2014 1846   RBC 4.42 11/26/2017 0859   RBC 3.85 (L) 09/09/2014 1846   HGB 13.0 11/26/2017 0859   HCT 38.8 11/26/2017 0859   PLT 172 09/09/2014 1846   MCV 88 11/26/2017 0859   MCH 29.4 11/26/2017 0859   MCH 30.1 09/09/2014 1846   MCHC 33.5 11/26/2017 0859   MCHC 34.0 09/09/2014 1846   RDW 13.7 11/26/2017 0859   LYMPHSABS 2.1 11/26/2017 0859   MONOABS 0.5 09/09/2014 1846   EOSABS 0.0 11/26/2017 0859   BASOSABS 0.0 11/26/2017 0859   Iron/TIBC/Ferritin/ %Sat No results found for: IRON, TIBC, FERRITIN, IRONPCTSAT Lipid Panel     Component Value Date/Time   CHOL 164 11/26/2017 0859   TRIG 117 11/26/2017 0859   HDL 44 11/26/2017 0859   CHOLHDL 4.1 09/11/2014 0630   VLDL 13 09/11/2014 0630   LDLCALC 97 11/26/2017 0859   Hepatic Function Panel     Component Value Date/Time   PROT 7.4 04/23/2018 1234   ALBUMIN 4.6 04/23/2018 1234   AST 17 04/23/2018 1234   ALT 14 04/23/2018 1234   ALKPHOS 113 04/23/2018 1234   BILITOT 0.3 04/23/2018 1234      Component Value Date/Time   TSH 2.800 11/26/2017 0859   TSH 3.374 09/11/2014 0630  Results for Cindy Phillips (MRN [id number]) as of 06/05/2018 16:06  Ref. Range 04/23/2018 12:34  Vitamin D, 25-Hydroxy Latest Ref Range: 30.0 - 100.0 ng/mL 41.9    ASSESSMENT AND PLAN: Vitamin D deficiency - Plan: Vitamin D, Ergocalciferol, (DRISDOL) 50000 units CAPS  capsule  Insulin resistance - Plan: metFORMIN (GLUCOPHAGE) 500 MG tablet  Obstructive sleep apnea syndrome  Class 2 severe obesity with serious comorbidity and body mass index (BMI) of 39.0 to 39.9 in adult, unspecified obesity type ([facility])  PLAN:  Insulin Resistance Cindy Phillips will continue to work on weight loss, exercise, and decreasing simple carbohydrates in her diet to help decrease the risk of diabetes. We dicussed metformin including benefits and risks. She was informed that eating too many simple carbohydrates or too many calories at one sitting increases the likelihood of GI side effects. Feiga agrees to continue taking metformin 500 mg 2 tablets q AM and 1 tablet q PM #90 and we will refill for 1  month. Cindy Phillips agrees to follow up with our clinic in 4 weeks as directed to monitor her progress.  Vitamin D Deficiency Cindy Phillips was informed that low vitamin D levels contributes to fatigue and are associated with obesity, breast, and colon cancer. Cindy Phillips agrees to continue taking prescription Vit D @50 ,000 IU every week #4 and we will refill for 1 month. She will follow up for routine testing of vitamin D, at least 2-3 times per year. She was informed of the risk of over-replacement of vitamin D and agrees to not increase her dose unless she discusses this with [place] first.  Obstructive Sleep Apnea Cindy Phillips agreed to call her sleep doctor today and ask about the CPAP machine. Cindy Phillips agrees to follow up with our clinic in 4 weeks.  Obesity Cindy Phillips is currently in the action stage of change. As such, her goal is to continue with weight loss efforts She has agreed to follow the Category 2 plan Cindy Phillips has been instructed to work up to a goal of 150 minutes of combined cardio and strengthening exercise per week for weight loss and overall health benefits. We discussed the following Behavioral Modification Strategies today: increasing lean protein intake, decreasing simple carbohydrates  and  dealing with family or coworker sabotage   Cindy Phillips has agreed to follow up with our clinic in 4 weeks. She was informed of the importance of frequent follow up visits to maximize her success with intensive lifestyle modifications for her multiple health conditions.   OBESITY BEHAVIORAL INTERVENTION VISIT  Today's visit was # 8   Starting weight: 289 lbs Starting date: 11/26/17 Today's weight : 282 lbs  Today's date: 06/04/2018 Total lbs lost to date: 7 At least 15 minutes were spent on discussing the following behavioral intervention visit.   ASK: We discussed the diagnosis of obesity with Cindy Phillips today and Cindy Phillips agreed to give [place] permission to discuss obesity behavioral modification therapy today.  ASSESS: Cindy Phillips has the diagnosis of obesity and her BMI today is 39.35 Cindy Phillips is in the action stage of change   ADVISE: Cindy Phillips was educated on the multiple health risks of obesity as well as the benefit of weight loss to improve her health. She was advised of the need for long term treatment and the importance of lifestyle modifications to improve her current health and to decrease her risk of future health problems.  AGREE: Multiple dietary modification options and treatment options were discussed and  Cindy Phillips agreed to follow the recommendations documented in the above note.  ARRANGE: Cindy Phillips was educated on the importance of frequent visits to treat obesity as outlined per CMS and USPSTF guidelines and agreed to schedule her next follow up appointment today.  I, Trixie Dredge, am acting as transcriptionist for Dennard Nip, MD  I have reviewed the above documentation for accuracy and completeness, and I agree with the above. -Dennard Nip, MD

## 2018-06-10 NOTE — Telephone Encounter (Signed)
Spoke with on 06/05/18 AO about this message he stated he would address this when he returns to the office on 06/11/18.

## 2018-06-10 NOTE — Telephone Encounter (Signed)
Heather please advise. Thanks.

## 2018-06-12 ENCOUNTER — Other Ambulatory Visit (INDEPENDENT_AMBULATORY_CARE_PROVIDER_SITE_OTHER): Payer: Self-pay | Admitting: Family Medicine

## 2018-06-12 DIAGNOSIS — E559 Vitamin D deficiency, unspecified: Secondary | ICD-10-CM

## 2018-06-12 NOTE — Telephone Encounter (Signed)
Spoke with AO he will review 06/13/18

## 2018-06-13 ENCOUNTER — Telehealth: Payer: Self-pay | Admitting: Pulmonary Disease

## 2018-06-13 DIAGNOSIS — G4733 Obstructive sleep apnea (adult) (pediatric): Secondary | ICD-10-CM

## 2018-06-13 DIAGNOSIS — M7751 Other enthesopathy of right foot: Secondary | ICD-10-CM | POA: Diagnosis not present

## 2018-06-13 DIAGNOSIS — M7989 Other specified soft tissue disorders: Secondary | ICD-10-CM | POA: Diagnosis not present

## 2018-06-13 NOTE — Telephone Encounter (Signed)
Please see 06/13/18 phone note.

## 2018-06-13 NOTE — Telephone Encounter (Signed)
Patient to resume CPAP  Pressure setting of 5-15, auto titrating CPAP  We will follow-up with compliance monitoring

## 2018-06-13 NOTE — Telephone Encounter (Signed)
Heather, please advise if this had been reviewed by AO and if we can close this encounter. Thanks!

## 2018-06-13 NOTE — Telephone Encounter (Signed)
Patient is aware and order has been placed and nothing further is needed at this time.

## 2018-06-16 NOTE — Telephone Encounter (Signed)
Noted. Will close open encounter. 

## 2018-06-18 DIAGNOSIS — E119 Type 2 diabetes mellitus without complications: Secondary | ICD-10-CM | POA: Diagnosis not present

## 2018-06-18 DIAGNOSIS — K5900 Constipation, unspecified: Secondary | ICD-10-CM | POA: Diagnosis not present

## 2018-06-19 DIAGNOSIS — G4733 Obstructive sleep apnea (adult) (pediatric): Secondary | ICD-10-CM | POA: Diagnosis not present

## 2018-06-24 DIAGNOSIS — G4733 Obstructive sleep apnea (adult) (pediatric): Secondary | ICD-10-CM | POA: Diagnosis not present

## 2018-06-30 DIAGNOSIS — M7989 Other specified soft tissue disorders: Secondary | ICD-10-CM | POA: Diagnosis not present

## 2018-06-30 DIAGNOSIS — M7671 Peroneal tendinitis, right leg: Secondary | ICD-10-CM | POA: Diagnosis not present

## 2018-07-01 DIAGNOSIS — G4733 Obstructive sleep apnea (adult) (pediatric): Secondary | ICD-10-CM | POA: Diagnosis not present

## 2018-07-02 ENCOUNTER — Ambulatory Visit (INDEPENDENT_AMBULATORY_CARE_PROVIDER_SITE_OTHER): Payer: Medicare Other | Admitting: Family Medicine

## 2018-07-02 VITALS — BP 115/75 | HR 81 | Temp 97.9°F | Ht 71.0 in | Wt 284.0 lb

## 2018-07-02 DIAGNOSIS — Z6839 Body mass index (BMI) 39.0-39.9, adult: Secondary | ICD-10-CM

## 2018-07-02 DIAGNOSIS — R7303 Prediabetes: Secondary | ICD-10-CM | POA: Diagnosis not present

## 2018-07-02 MED ORDER — METFORMIN HCL 500 MG PO TABS: 500.0000 mg | ORAL_TABLET | Freq: Every day | ORAL | 0 refills | Status: DC

## 2018-07-03 NOTE — Progress Notes (Signed)
Office: [phone]  /  Fax: [phone]   HPI:   Chief Complaint: OBESITY Cindy Phillips is here to discuss her progress with her obesity treatment plan. She is on the Category 2 plan and is following her eating plan approximately 60 % of the time. She states she is exercising 0 minutes 0 times per week. Cindy Phillips struggled to stay on track with diet prescription. She notes increased temptations in the last month.  Her weight is 284 lb (128.8 kg) today and has gained 2 pounds since her last visit. She has lost 5 lbs since starting treatment with [place].  Pre-Diabetes Cindy Phillips has a diagnosis of pre-diabetes based on her elevated Hgb A1c and was informed this puts her at greater risk of developing diabetes. She is stable on metformin and she denies nausea, vomiting, or hypoglycemia after she decreased her dose due to GI upset. She continues to work on diet and exercise to decrease risk of diabetes.   ALLERGIES: No Known Allergies  MEDICATIONS: Current Outpatient Medications on File Prior to Visit  Medication Sig Dispense Refill  . atorvastatin (LIPITOR) 10 MG tablet Take 10 mg by mouth daily.    . hydrOXYzine (ATARAX/VISTARIL) 50 MG tablet Take 1 tablet (50 mg total) by mouth every 4 (four) hours as needed for anxiety. 45 tablet 0  . lamoTRIgine (LAMICTAL) 25 MG tablet Take 1 tablet (25 mg total) by mouth daily. For mood stabilization 30 tablet 0  . medroxyPROGESTERone (DEPO-PROVERA) 150 MG/ML injection Inject 1 mL (150 mg total) into the muscle every 3 (three) months. For prevention of pregnancy 1 mL   . OLANZapine zydis (ZYPREXA) 15 MG disintegrating tablet Take 1 tablet (15 mg total) by mouth at bedtime. For mood control 30 tablet 0  . sodium chloride (OCEAN) 0.65 % SOLN nasal spray Place 1 spray into both nostrils as needed for congestion.  0  . Vitamin D, Ergocalciferol, (DRISDOL) 50000 units CAPS capsule Take 1 capsule (50,000 Units total) by mouth every 7 (seven) days. 4 capsule 0   No  current facility-administered medications on file prior to visit.     PAST MEDICAL HISTORY: Past Medical History:  Diagnosis Date  . Bipolar 1 disorder ([place])   . Bowel incontinence   . Depression   . Mental disability     PAST SURGICAL HISTORY: Past Surgical History:  Procedure Laterality Date  . FOOT SURGERY      SOCIAL HISTORY: Social History   Tobacco Use  . Smoking status: Never Smoker  . Smokeless tobacco: Never Used  Substance Use Topics  . Alcohol use: No  . Drug use: No    FAMILY HISTORY: Family History  Problem Relation Age of Onset  . High blood pressure Mother   . Anxiety disorder Mother   . Diabetes Maternal Grandmother     ROS: Review of Systems  Constitutional: Negative for weight loss.  Gastrointestinal: Negative for nausea and vomiting.  Endo/Heme/Allergies:       Negative hypoglycemia    PHYSICAL EXAM: Blood pressure 115/75, pulse 81, temperature 97.9 F (36.6 C), temperature source Oral, height 5\' 11"  (1.803 m), weight 284 lb (128.8 kg), SpO2 99 %. Body mass index is 39.61 kg/m. Physical Exam  Constitutional: She is oriented to person, place, and time. She appears well-developed and well-nourished.  Cardiovascular: Normal rate.  Pulmonary/Chest: Effort normal.  Musculoskeletal: Normal range of motion.  Neurological: She is oriented to person, place, and time.  Skin: Skin is warm and dry.  Psychiatric: She has a  normal mood and affect. Her behavior is normal.  Vitals reviewed.   RECENT LABS AND TESTS: BMET    Component Value Date/Time   NA 140 04/23/2018 1234   K 4.0 04/23/2018 1234   CL 103 04/23/2018 1234   CO2 19 (L) 04/23/2018 1234   GLUCOSE 87 04/23/2018 1234   GLUCOSE 99 09/09/2014 1846   BUN 6 04/23/2018 1234   CREATININE 0.81 04/23/2018 1234   CALCIUM 9.5 04/23/2018 1234   GFRNONAA 99 04/23/2018 1234   GFRAA 114 04/23/2018 1234   Lab Results  Component Value Date   HGBA1C 5.3 04/23/2018   HGBA1C 5.4 11/26/2017    HGBA1C 5.4 09/11/2014   Lab Results  Component Value Date   INSULIN 31.2 (H) 04/23/2018   INSULIN 44.0 (H) 11/26/2017   CBC    Component Value Date/Time   WBC 6.4 11/26/2017 0859   WBC 6.9 09/09/2014 1846   RBC 4.42 11/26/2017 0859   RBC 3.85 (L) 09/09/2014 1846   HGB 13.0 11/26/2017 0859   HCT 38.8 11/26/2017 0859   PLT 172 09/09/2014 1846   MCV 88 11/26/2017 0859   MCH 29.4 11/26/2017 0859   MCH 30.1 09/09/2014 1846   MCHC 33.5 11/26/2017 0859   MCHC 34.0 09/09/2014 1846   RDW 13.7 11/26/2017 0859   LYMPHSABS 2.1 11/26/2017 0859   MONOABS 0.5 09/09/2014 1846   EOSABS 0.0 11/26/2017 0859   BASOSABS 0.0 11/26/2017 0859   Iron/TIBC/Ferritin/ %Sat No results found for: IRON, TIBC, FERRITIN, IRONPCTSAT Lipid Panel     Component Value Date/Time   CHOL 164 11/26/2017 0859   TRIG 117 11/26/2017 0859   HDL 44 11/26/2017 0859   CHOLHDL 4.1 09/11/2014 0630   VLDL 13 09/11/2014 0630   LDLCALC 97 11/26/2017 0859   Hepatic Function Panel     Component Value Date/Time   PROT 7.4 04/23/2018 1234   ALBUMIN 4.6 04/23/2018 1234   AST 17 04/23/2018 1234   ALT 14 04/23/2018 1234   ALKPHOS 113 04/23/2018 1234   BILITOT 0.3 04/23/2018 1234      Component Value Date/Time   TSH 2.800 11/26/2017 0859   TSH 3.374 09/11/2014 0630    ASSESSMENT AND PLAN: Class 2 severe obesity with serious comorbidity and body mass index (BMI) of 39.0 to 39.9 in adult, unspecified obesity type (HCC)  Insulin resistance - Plan: metFORMIN (GLUCOPHAGE) 500 MG tablet  PLAN:  Pre-Diabetes Cindy Phillips will continue to work on weight loss, exercise, and decreasing simple carbohydrates in her diet to help decrease the risk of diabetes. We dicussed metformin including benefits and risks. She was informed that eating too many simple carbohydrates or too many calories at one sitting increases the likelihood of GI side effects. Cindy Phillips agrees to decrease metformin to 500 mg qhs #30 with no refills. Cindy Phillips  agrees to follow up with our clinic in 4 weeks as directed to monitor her progress.  Obesity Cindy Phillips is currently in the action stage of change. As such, her goal is to continue with weight loss efforts She has agreed to follow the Category 2 plan Cindy Phillips has been instructed to work up to a goal of 150 minutes of combined cardio and strengthening exercise per week or walking for weight loss and overall health benefits. We discussed the following Behavioral Modification Strategies today: increasing lean protein intake, decreasing simple carbohydrates, and travel eating strategies    Cindy Phillips has agreed to follow up with our clinic in 4 weeks. She was informed of the importance of  frequent follow up visits to maximize her success with intensive lifestyle modifications for her multiple health conditions.   OBESITY BEHAVIORAL INTERVENTION VISIT  Today's visit was # 9   Starting weight: 289 lbs Starting date: 11/26/17 Today's weight : 284 lbs Today's date: 07/02/2018 Total lbs lost to date: 5 At least 15 minutes were spent on discussing the following behavioral intervention visit.   ASK: We discussed the diagnosis of obesity with Cindy Phillips today and Cindy Phillips agreed to give [place] permission to discuss obesity behavioral modification therapy today.  ASSESS: Cindy Phillips has the diagnosis of obesity and her BMI today is 39.63 Cindy Phillips is in the action stage of change   ADVISE: Cindy Phillips was educated on the multiple health risks of obesity as well as the benefit of weight loss to improve her health. She was advised of the need for long term treatment and the importance of lifestyle modifications to improve her current health and to decrease her risk of future health problems.  AGREE: Multiple dietary modification options and treatment options were discussed and  Cindy Phillips agreed to follow the recommendations documented in the above note.  ARRANGE: Cindy Phillips was educated on the importance of  frequent visits to treat obesity as outlined per CMS and USPSTF guidelines and agreed to schedule her next follow up appointment today.  I, Trixie Dredge, am acting as transcriptionist for Dennard Nip, MD  I have reviewed the above documentation for accuracy and completeness, and I agree with the above. -Dennard Nip, MD

## 2018-07-23 ENCOUNTER — Ambulatory Visit (INDEPENDENT_AMBULATORY_CARE_PROVIDER_SITE_OTHER): Payer: Medicare Other | Admitting: Family Medicine

## 2018-07-23 VITALS — BP 117/81 | HR 87 | Temp 98.4°F | Ht 71.0 in | Wt 285.0 lb

## 2018-07-23 DIAGNOSIS — E559 Vitamin D deficiency, unspecified: Secondary | ICD-10-CM | POA: Diagnosis not present

## 2018-07-23 DIAGNOSIS — Z6839 Body mass index (BMI) 39.0-39.9, adult: Secondary | ICD-10-CM | POA: Diagnosis not present

## 2018-07-23 MED ORDER — VITAMIN D (ERGOCALCIFEROL) 1.25 MG (50000 UNIT) PO CAPS: 50000.0000 [IU] | ORAL_CAPSULE | ORAL | 0 refills | Status: DC

## 2018-07-28 ENCOUNTER — Encounter (INDEPENDENT_AMBULATORY_CARE_PROVIDER_SITE_OTHER): Payer: Self-pay | Admitting: Family Medicine

## 2018-07-28 DIAGNOSIS — M7662 Achilles tendinitis, left leg: Secondary | ICD-10-CM | POA: Diagnosis not present

## 2018-07-28 NOTE — Progress Notes (Signed)
Office: [phone]  /  Fax: [phone]   HPI:   Chief Complaint: OBESITY Cindy Phillips is here to discuss her progress with her obesity treatment plan. She is on the Category 2 plan and is following her eating plan approximately 25 % of the time. She states she is exercising 0 minutes 0 times per week. Cindy Phillips struggles to follow her plan closely. She has been eating more simple carbs and not enough vegetables and protein. She did well mostly maintaining weight over Thanksgiving.  Her weight is 285 lb (129.3 kg) today and has had a weight gain of 1 pound over a period of 3 weeks since her last visit. She has lost 4 lbs since starting treatment with [place].  Vitamin D deficiency Cindy Phillips has a diagnosis of vitamin D deficiency. She is currently stable on vit D. She is not yet at goal, but is improving. She denies nausea, vomiting, or muscle weakness.  ALLERGIES: No Known Allergies  MEDICATIONS: Current Outpatient Medications on File Prior to Visit  Medication Sig Dispense Refill  . atorvastatin (LIPITOR) 10 MG tablet Take 10 mg by mouth daily.    . hydrOXYzine (ATARAX/VISTARIL) 50 MG tablet Take 1 tablet (50 mg total) by mouth every 4 (four) hours as needed for anxiety. 45 tablet 0  . lamoTRIgine (LAMICTAL) 25 MG tablet Take 1 tablet (25 mg total) by mouth daily. For mood stabilization 30 tablet 0  . medroxyPROGESTERone (DEPO-PROVERA) 150 MG/ML injection Inject 1 mL (150 mg total) into the muscle every 3 (three) months. For prevention of pregnancy 1 mL   . metFORMIN (GLUCOPHAGE) 500 MG tablet Take 1 tablet (500 mg total) by mouth daily with breakfast. 30 tablet 0  . OLANZapine zydis (ZYPREXA) 15 MG disintegrating tablet Take 1 tablet (15 mg total) by mouth at bedtime. For mood control 30 tablet 0  . sodium chloride (OCEAN) 0.65 % SOLN nasal spray Place 1 spray into both nostrils as needed for congestion.  0   No current facility-administered medications on file prior to visit.     PAST  MEDICAL HISTORY: Past Medical History:  Diagnosis Date  . Bipolar 1 disorder ([place])   . Bowel incontinence   . Depression   . Mental disability     PAST SURGICAL HISTORY: Past Surgical History:  Procedure Laterality Date  . FOOT SURGERY      SOCIAL HISTORY: Social History   Tobacco Use  . Smoking status: Never Smoker  . Smokeless tobacco: Never Used  Substance Use Topics  . Alcohol use: No  . Drug use: No    FAMILY HISTORY: Family History  Problem Relation Age of Onset  . High blood pressure Mother   . Anxiety disorder Mother   . Diabetes Maternal Grandmother     ROS: Review of Systems  Constitutional: Negative for weight loss.  Gastrointestinal: Negative for nausea and vomiting.  Musculoskeletal:       Negative for muscle weakness.    PHYSICAL EXAM: Blood pressure 117/81, pulse 87, temperature 98.4 F (36.9 C), temperature source Oral, height 5\' 11"  (1.803 m), weight 285 lb (129.3 kg), SpO2 94 %. Body mass index is 39.75 kg/m. Physical Exam  Constitutional: She is oriented to person, place, and time. She appears well-developed and well-nourished.  Cardiovascular: Normal rate.  Pulmonary/Chest: Effort normal.  Musculoskeletal: Normal range of motion.  Neurological: She is oriented to person, place, and time.  Skin: Skin is warm and dry.  Psychiatric: She has a normal mood and affect. Her behavior is  normal.  Vitals reviewed.   RECENT LABS AND TESTS: BMET    Component Value Date/Time   NA 140 04/23/2018 1234   K 4.0 04/23/2018 1234   CL 103 04/23/2018 1234   CO2 19 (L) 04/23/2018 1234   GLUCOSE 87 04/23/2018 1234   GLUCOSE 99 09/09/2014 1846   BUN 6 04/23/2018 1234   CREATININE 0.81 04/23/2018 1234   CALCIUM 9.5 04/23/2018 1234   GFRNONAA 99 04/23/2018 1234   GFRAA 114 04/23/2018 1234   Lab Results  Component Value Date   HGBA1C 5.3 04/23/2018   HGBA1C 5.4 11/26/2017   HGBA1C 5.4 09/11/2014   Lab Results  Component Value Date   INSULIN  31.2 (H) 04/23/2018   INSULIN 44.0 (H) 11/26/2017   CBC    Component Value Date/Time   WBC 6.4 11/26/2017 0859   WBC 6.9 09/09/2014 1846   RBC 4.42 11/26/2017 0859   RBC 3.85 (L) 09/09/2014 1846   HGB 13.0 11/26/2017 0859   HCT 38.8 11/26/2017 0859   PLT 172 09/09/2014 1846   MCV 88 11/26/2017 0859   MCH 29.4 11/26/2017 0859   MCH 30.1 09/09/2014 1846   MCHC 33.5 11/26/2017 0859   MCHC 34.0 09/09/2014 1846   RDW 13.7 11/26/2017 0859   LYMPHSABS 2.1 11/26/2017 0859   MONOABS 0.5 09/09/2014 1846   EOSABS 0.0 11/26/2017 0859   BASOSABS 0.0 11/26/2017 0859   Iron/TIBC/Ferritin/ %Sat No results found for: IRON, TIBC, FERRITIN, IRONPCTSAT Lipid Panel     Component Value Date/Time   CHOL 164 11/26/2017 0859   TRIG 117 11/26/2017 0859   HDL 44 11/26/2017 0859   CHOLHDL 4.1 09/11/2014 0630   VLDL 13 09/11/2014 0630   LDLCALC 97 11/26/2017 0859   Hepatic Function Panel     Component Value Date/Time   PROT 7.4 04/23/2018 1234   ALBUMIN 4.6 04/23/2018 1234   AST 17 04/23/2018 1234   ALT 14 04/23/2018 1234   ALKPHOS 113 04/23/2018 1234   BILITOT 0.3 04/23/2018 1234      Component Value Date/Time   TSH 2.800 11/26/2017 0859   TSH 3.374 09/11/2014 0630   Results for SONNY, POTH (MRN [id number]) as of 07/28/2018 06:16  Ref. Range 04/23/2018 12:34  Vitamin D, 25-Hydroxy Latest Ref Range: 30.0 - 100.0 ng/mL 41.9   ASSESSMENT AND PLAN: Vitamin D deficiency - Plan: Vitamin D, Ergocalciferol, (DRISDOL) 1.25 MG (50000 UT) CAPS capsule  Class 2 severe obesity with serious comorbidity and body mass index (BMI) of 39.0 to 39.9 in adult, unspecified obesity type (HCC)  PLAN:  Vitamin D Deficiency Cindy Phillips was informed that low vitamin D levels contributes to fatigue and are associated with obesity, breast, and colon cancer. She agrees to continue to take prescription Vit D @50 ,000 IU every week #4 with no refills and will follow up for routine testing of vitamin D, at least 2-3  times per year. She was informed of the risk of over-replacement of vitamin D and agrees to not increase her dose unless she discusses this with [place] first. We will check labs next month and Cindy Phillips agrees to follow up at the agreed upon time.  Obesity Cindy Phillips is currently in the action stage of change. As such, her goal is to continue with weight loss efforts. She has agreed to follow the Category 2 plan. Cindy Phillips has been instructed to work up to a goal of 150 minutes of combined cardio and strengthening exercise per week for weight loss and overall health benefits. We  discussed the following Behavioral Modification Strategies today: increasing lean protein intake, increasing vegetables, keeping healthy foods in the home, and holiday eating strategies.    Cindy Phillips has agreed to follow up with our clinic in 4 weeks. She was informed of the importance of frequent follow up visits to maximize her success with intensive lifestyle modifications for her multiple health conditions.   OBESITY BEHAVIORAL INTERVENTION VISIT  Today's visit was # 10   Starting weight: 289 lbs Starting date: 11/26/17 Today's weight : Weight: 285 lb (129.3 kg)  Today's date: 07/23/2018 Total lbs lost to date: 4 At least 15 minutes were spent on discussing the following behavioral intervention visit.  ASK: We discussed the diagnosis of obesity with Cindy Phillips today and Cindy Phillips agreed to give [place] permission to discuss obesity behavioral modification therapy today.  ASSESS: Cindy Phillips has the diagnosis of obesity and her BMI today is 39.75. Cindy Phillips is in the action stage of change   ADVISE: Cindy Phillips was educated on the multiple health risks of obesity as well as the benefit of weight loss to improve her health. She was advised of the need for long term treatment and the importance of lifestyle modifications to improve her current health and to decrease her risk of future health problems.  AGREE: Multiple dietary  modification options and treatment options were discussed and Cindy Phillips agreed to follow the recommendations documented in the above note.  ARRANGE: [place] was educated on the importance of frequent visits to treat obesity as outlined per CMS and USPSTF guidelines and agreed to schedule her next follow up appointment today.  I, Cindy Phillips, am acting as transcriptionist for Starlyn Skeans, MD  I have reviewed the above documentation for accuracy and completeness, and I agree with the above. -Dennard Nip, MD

## 2018-07-30 ENCOUNTER — Ambulatory Visit (INDEPENDENT_AMBULATORY_CARE_PROVIDER_SITE_OTHER): Payer: Medicare Other | Admitting: Family Medicine

## 2018-07-31 DIAGNOSIS — G4733 Obstructive sleep apnea (adult) (pediatric): Secondary | ICD-10-CM | POA: Diagnosis not present

## 2018-08-05 DIAGNOSIS — E119 Type 2 diabetes mellitus without complications: Secondary | ICD-10-CM | POA: Diagnosis not present

## 2018-08-05 DIAGNOSIS — E118 Type 2 diabetes mellitus with unspecified complications: Secondary | ICD-10-CM | POA: Diagnosis not present

## 2018-08-05 DIAGNOSIS — R1030 Lower abdominal pain, unspecified: Secondary | ICD-10-CM | POA: Diagnosis not present

## 2018-08-18 DIAGNOSIS — M7662 Achilles tendinitis, left leg: Secondary | ICD-10-CM | POA: Diagnosis not present

## 2018-08-18 DIAGNOSIS — M7752 Other enthesopathy of left foot: Secondary | ICD-10-CM | POA: Diagnosis not present

## 2018-08-20 ENCOUNTER — Other Ambulatory Visit (INDEPENDENT_AMBULATORY_CARE_PROVIDER_SITE_OTHER): Payer: Self-pay | Admitting: Family Medicine

## 2018-08-20 DIAGNOSIS — E559 Vitamin D deficiency, unspecified: Secondary | ICD-10-CM

## 2018-08-25 ENCOUNTER — Encounter (INDEPENDENT_AMBULATORY_CARE_PROVIDER_SITE_OTHER): Payer: Self-pay | Admitting: Family Medicine

## 2018-08-25 ENCOUNTER — Ambulatory Visit (INDEPENDENT_AMBULATORY_CARE_PROVIDER_SITE_OTHER): Payer: Medicare Other | Admitting: Family Medicine

## 2018-08-25 VITALS — BP 125/77 | HR 111 | Temp 97.9°F | Ht 71.0 in | Wt 294.0 lb

## 2018-08-25 DIAGNOSIS — E559 Vitamin D deficiency, unspecified: Secondary | ICD-10-CM

## 2018-08-25 DIAGNOSIS — Z6841 Body Mass Index (BMI) 40.0 and over, adult: Secondary | ICD-10-CM | POA: Diagnosis not present

## 2018-08-25 MED ORDER — VITAMIN D (ERGOCALCIFEROL) 1.25 MG (50000 UNIT) PO CAPS: 50000.0000 [IU] | ORAL_CAPSULE | ORAL | 0 refills | Status: DC

## 2018-08-26 NOTE — Progress Notes (Signed)
Office: [phone]  /  Fax: [phone]   HPI:   Chief Complaint: OBESITY Cindy Phillips is here to discuss her progress with her obesity treatment plan. She was advised to follow the Category 2 plan and is following her eating plan approximately 10 % of the time. She states she is exercising 0 minutes 0 times per week. [name] hasn't been following her plan closely. She was mostly trying to follow the portion control and smarter choices plan. She states that she has been eating about 6 peanut butter and jelly sandwiches per day for the last 2 weeks, which is at least 400 calories each. She is drinking more regular soda and juice as well.  Her weight is 294 lb (133.4 kg) today and has had a weight gain of 9 pounds over a period of 5 weeks since her last visit. She has lost 0 lbs since starting treatment with [place].  Vitamin D deficiency Cindy Phillips has a diagnosis of vitamin D deficiency. She is currently taking vit D, but is not yet at goal. She denies nausea, vomiting, or muscle weakness.  ASSESSMENT AND PLAN:  Vitamin D deficiency - Plan: Vitamin D, Ergocalciferol, (DRISDOL) 1.25 MG (50000 UT) CAPS capsule  Class 3 severe obesity with serious comorbidity and body mass index (BMI) of 40.0 to 44.9 in adult, unspecified obesity type ([place])  PLAN:  Vitamin D Deficiency Cindy Phillips was informed that low vitamin D levels contributes to fatigue and are associated with obesity, breast, and colon cancer. She agrees to continue to take prescription Vit D @50 ,000 IU every week #4 with no refills and will follow up for routine testing of vitamin D, at least 2-3 times per year. She was informed of the risk of over-replacement of vitamin D and agrees to not increase her dose unless she discusses this with [place] first. Cindy Phillips agrees to follow up in 3 to 4 weeks.  Obesity Cindy Phillips is currently in the action stage of change. As such, her goal is to continue with weight loss efforts. She has agreed to change to a lower  carbohydrate, vegetable and lean protein rich diet plan. Cindy Phillips was educated on calories and sugar in juice and other foods. Cindy Phillips has been instructed to work up to a goal of 150 minutes of combined cardio and strengthening exercise per week for weight loss and overall health benefits. We discussed the following Behavioral Modification Strategies today: increasing lean protein intake, decreasing simple carbohydrates, better snacking choices, and decrease liquid calories.  Cindy Phillips has agreed to follow up with our clinic in 3 to 4 weeks. She was informed of the importance of frequent follow up visits to maximize her success with intensive lifestyle modifications for her multiple health conditions.  ALLERGIES: No Known Allergies  MEDICATIONS: Current Outpatient Medications on File Prior to Visit  Medication Sig Dispense Refill  . atorvastatin (LIPITOR) 10 MG tablet Take 10 mg by mouth daily.    . hydrOXYzine (ATARAX/VISTARIL) 50 MG tablet Take 1 tablet (50 mg total) by mouth every 4 (four) hours as needed for anxiety. 45 tablet 0  . lamoTRIgine (LAMICTAL) 25 MG tablet Take 1 tablet (25 mg total) by mouth daily. For mood stabilization 30 tablet 0  . medroxyPROGESTERone (DEPO-PROVERA) 150 MG/ML injection Inject 1 mL (150 mg total) into the muscle every 3 (three) months. For prevention of pregnancy 1 mL   . metFORMIN (GLUCOPHAGE) 500 MG tablet Take 1 tablet (500 mg total) by mouth daily with breakfast. 30 tablet 0  . OLANZapine zydis (ZYPREXA)  15 MG disintegrating tablet Take 1 tablet (15 mg total) by mouth at bedtime. For mood control 30 tablet 0  . sodium chloride (OCEAN) 0.65 % SOLN nasal spray Place 1 spray into both nostrils as needed for congestion.  0   No current facility-administered medications on file prior to visit.     PAST MEDICAL HISTORY: Past Medical History:  Diagnosis Date  . Bipolar 1 disorder ([place])   . Bowel incontinence   . Depression   . Mental disability     PAST  SURGICAL HISTORY: Past Surgical History:  Procedure Laterality Date  . FOOT SURGERY      SOCIAL HISTORY: Social History   Tobacco Use  . Smoking status: Never Smoker  . Smokeless tobacco: Never Used  Substance Use Topics  . Alcohol use: No  . Drug use: No    FAMILY HISTORY: Family History  Problem Relation Age of Onset  . High blood pressure Mother   . Anxiety disorder Mother   . Diabetes Maternal Grandmother     ROS: Review of Systems  Constitutional: Negative for weight loss.  Gastrointestinal: Negative for nausea and vomiting.  Musculoskeletal:       Negative for muscle weakness.   PHYSICAL EXAM: Blood pressure 125/77, pulse (!) 111, temperature 97.9 F (36.6 C), temperature source Oral, height 5\' 11"  (1.803 m), weight 294 lb (133.4 kg), SpO2 98 %. Body mass index is 41 kg/m. Physical Exam Vitals signs reviewed.  Constitutional:      Appearance: Normal appearance. She is obese.  Cardiovascular:     Rate and Rhythm: Normal rate.  Pulmonary:     Effort: Pulmonary effort is normal.  Musculoskeletal: Normal range of motion.  Skin:    General: Skin is warm and dry.  Neurological:     Mental Status: She is alert and oriented to person, place, and time.  Psychiatric:        Mood and Affect: Mood normal.        Behavior: Behavior normal.    RECENT LABS AND TESTS: BMET    Component Value Date/Time   NA 140 04/23/2018 1234   K 4.0 04/23/2018 1234   CL 103 04/23/2018 1234   CO2 19 (L) 04/23/2018 1234   GLUCOSE 87 04/23/2018 1234   GLUCOSE 99 09/09/2014 1846   BUN 6 04/23/2018 1234   CREATININE 0.81 04/23/2018 1234   CALCIUM 9.5 04/23/2018 1234   GFRNONAA 99 04/23/2018 1234   GFRAA 114 04/23/2018 1234   Lab Results  Component Value Date   HGBA1C 5.3 04/23/2018   HGBA1C 5.4 11/26/2017   HGBA1C 5.4 09/11/2014   Lab Results  Component Value Date   INSULIN 31.2 (H) 04/23/2018   INSULIN 44.0 (H) 11/26/2017   CBC    Component Value Date/Time   WBC  6.4 11/26/2017 0859   WBC 6.9 09/09/2014 1846   RBC 4.42 11/26/2017 0859   RBC 3.85 (L) 09/09/2014 1846   HGB 13.0 11/26/2017 0859   HCT 38.8 11/26/2017 0859   PLT 172 09/09/2014 1846   MCV 88 11/26/2017 0859   MCH 29.4 11/26/2017 0859   MCH 30.1 09/09/2014 1846   MCHC 33.5 11/26/2017 0859   MCHC 34.0 09/09/2014 1846   RDW 13.7 11/26/2017 0859   LYMPHSABS 2.1 11/26/2017 0859   MONOABS 0.5 09/09/2014 1846   EOSABS 0.0 11/26/2017 0859   BASOSABS 0.0 11/26/2017 0859   Iron/TIBC/Ferritin/ %Sat No results found for: IRON, TIBC, FERRITIN, IRONPCTSAT Lipid Panel     Component  Value Date/Time   CHOL 164 11/26/2017 0859   TRIG 117 11/26/2017 0859   HDL 44 11/26/2017 0859   CHOLHDL 4.1 09/11/2014 0630   VLDL 13 09/11/2014 0630   LDLCALC 97 11/26/2017 0859   Hepatic Function Panel     Component Value Date/Time   PROT 7.4 04/23/2018 1234   ALBUMIN 4.6 04/23/2018 1234   AST 17 04/23/2018 1234   ALT 14 04/23/2018 1234   ALKPHOS 113 04/23/2018 1234   BILITOT 0.3 04/23/2018 1234      Component Value Date/Time   TSH 2.800 11/26/2017 0859   TSH 3.374 09/11/2014 0630   Results for REIGHLYNN, SWINEY (MRN [id number]) as of 08/26/2018 07:16  Ref. Range 04/23/2018 12:34  Vitamin D, 25-Hydroxy Latest Ref Range: 30.0 - 100.0 ng/mL 41.9    OBESITY BEHAVIORAL INTERVENTION VISIT  Today's visit was # 11   Starting weight: 289 lbs Starting date: 11/26/17 Today's weight : Weight: 294 lb (133.4 kg)  Today's date: 08/25/2018 Total lbs lost to date: 0 At least 15 minutes were spent on discussing the following behavioral intervention visit.  ASK: We discussed the diagnosis of obesity with [name] today and Anusha agreed to give [place] permission to discuss obesity behavioral modification therapy today.  ASSESS: Zurri has the diagnosis of obesity and her BMI today is 41.0. Carolyna is in the action stage of change.   ADVISE: Clorine was educated on the multiple health risks of  obesity as well as the benefit of weight loss to improve her health. She was advised of the need for long term treatment and the importance of lifestyle modifications to improve her current health and to decrease her risk of future health problems.  AGREE: Multiple dietary modification options and treatment options were discussed and Alleah agreed to follow the recommendations documented in the above note.  ARRANGE: Sophira was educated on the importance of frequent visits to treat obesity as outlined per CMS and USPSTF guidelines and agreed to schedule her next follow up appointment today.  I, Marcille Blanco, am acting as transcriptionist for Starlyn Skeans, MD I have reviewed the above documentation for accuracy and completeness, and I agree with the above. -Dennard Nip, MD

## 2018-08-31 DIAGNOSIS — G4733 Obstructive sleep apnea (adult) (pediatric): Secondary | ICD-10-CM | POA: Diagnosis not present

## 2018-09-01 DIAGNOSIS — M7752 Other enthesopathy of left foot: Secondary | ICD-10-CM | POA: Diagnosis not present

## 2018-09-01 DIAGNOSIS — M7662 Achilles tendinitis, left leg: Secondary | ICD-10-CM | POA: Diagnosis not present

## 2018-09-06 DIAGNOSIS — E118 Type 2 diabetes mellitus with unspecified complications: Secondary | ICD-10-CM | POA: Diagnosis not present

## 2018-09-06 DIAGNOSIS — R42 Dizziness and giddiness: Secondary | ICD-10-CM | POA: Diagnosis not present

## 2018-09-07 ENCOUNTER — Other Ambulatory Visit (INDEPENDENT_AMBULATORY_CARE_PROVIDER_SITE_OTHER): Payer: Self-pay | Admitting: Family Medicine

## 2018-09-07 DIAGNOSIS — E559 Vitamin D deficiency, unspecified: Secondary | ICD-10-CM

## 2018-09-08 DIAGNOSIS — R42 Dizziness and giddiness: Secondary | ICD-10-CM | POA: Diagnosis not present

## 2018-09-08 DIAGNOSIS — K5900 Constipation, unspecified: Secondary | ICD-10-CM | POA: Diagnosis not present

## 2018-09-08 DIAGNOSIS — E119 Type 2 diabetes mellitus without complications: Secondary | ICD-10-CM | POA: Diagnosis not present

## 2018-09-08 DIAGNOSIS — E118 Type 2 diabetes mellitus with unspecified complications: Secondary | ICD-10-CM | POA: Diagnosis not present

## 2018-09-08 LAB — LIPID PANEL
Cholesterol: 172 (ref 0–200)
HDL: 59 (ref 35–70)
LDL Cholesterol: 90
Triglycerides: 131 (ref 40–160)

## 2018-09-08 LAB — CBC AND DIFFERENTIAL
HCT: 18 — AB (ref 36–46)
Hemoglobin: 12.8 (ref 12.0–16.0)
Platelets: 233 (ref 150–399)
WBC: 9.5

## 2018-09-08 LAB — BASIC METABOLIC PANEL
Creatinine: 0.9 (ref 0.5–1.1)
Glucose: 79

## 2018-09-08 LAB — HEPATIC FUNCTION PANEL
ALT: 25 (ref 7–35)
AST: 15 (ref 13–35)
Alkaline Phosphatase: 91 (ref 25–125)

## 2018-09-08 LAB — HEMOGLOBIN A1C: Hemoglobin A1C: 5.6

## 2018-09-10 ENCOUNTER — Other Ambulatory Visit (INDEPENDENT_AMBULATORY_CARE_PROVIDER_SITE_OTHER): Payer: Self-pay | Admitting: Family Medicine

## 2018-09-10 DIAGNOSIS — E559 Vitamin D deficiency, unspecified: Secondary | ICD-10-CM

## 2018-09-15 ENCOUNTER — Ambulatory Visit (INDEPENDENT_AMBULATORY_CARE_PROVIDER_SITE_OTHER): Payer: Medicare Other | Admitting: Family Medicine

## 2018-09-15 ENCOUNTER — Encounter (INDEPENDENT_AMBULATORY_CARE_PROVIDER_SITE_OTHER): Payer: Self-pay | Admitting: Family Medicine

## 2018-09-15 VITALS — BP 124/79 | HR 111 | Ht 71.0 in | Wt 300.0 lb

## 2018-09-15 DIAGNOSIS — R7303 Prediabetes: Secondary | ICD-10-CM | POA: Diagnosis not present

## 2018-09-15 DIAGNOSIS — E559 Vitamin D deficiency, unspecified: Secondary | ICD-10-CM

## 2018-09-15 DIAGNOSIS — Z6841 Body Mass Index (BMI) 40.0 and over, adult: Secondary | ICD-10-CM | POA: Diagnosis not present

## 2018-09-15 DIAGNOSIS — E66813 Obesity, class 3: Secondary | ICD-10-CM

## 2018-09-15 MED ORDER — METFORMIN HCL 500 MG PO TABS: 500.0000 mg | ORAL_TABLET | Freq: Two times a day (BID) | ORAL | 0 refills | Status: DC

## 2018-09-15 MED ORDER — VITAMIN D (ERGOCALCIFEROL) 1.25 MG (50000 UNIT) PO CAPS: 50000.0000 [IU] | ORAL_CAPSULE | ORAL | 0 refills | Status: DC

## 2018-09-16 NOTE — Progress Notes (Signed)
Office: [phone]  /  Fax: [phone]   HPI:   Chief Complaint: OBESITY Cindy Phillips is here to discuss her progress with her obesity treatment plan. She was advised to follow the Category 3 plan and is following her eating plan approximately 0 % of the time. She states she is exercising 0 minutes 0 times per week. Cindy Phillips is currently struggling to follow any of our structured plans and continues to gain weight. She is now up 11 pounds from her original weight at her 1st visit. She denies excessive hunger, but cannot seem to plan meals ahead of time, which is required to do well with weight loss.  Her weight is 300 lb (136.1 kg) today and has had a weight gain of 6 pounds over a period of 3 weeks since her last visit. She has lost 0 lbs since starting treatment with [place].  Pre-Diabetes Cindy Phillips has a diagnosis of pre-diabetes based on her elevated Hgb A1c and was informed this puts her at greater risk of developing diabetes. She is taking metformin currently. She struggles to follow her plan and has been gaining weight. She admits polyphagia and denies nausea, vomiting, or hypoglycemia.  Vitamin D deficiency Cindy Phillips has a diagnosis of vitamin D deficiency. She is currently taking vit D, but is not yet at goal. She denies nausea, vomiting, or muscle weakness.  ASSESSMENT AND PLAN:  Prediabetes - Plan: metFORMIN (GLUCOPHAGE) 500 MG tablet  Vitamin D deficiency - Plan: Vitamin D, Ergocalciferol, (DRISDOL) 1.25 MG (50000 UT) CAPS capsule  Class 3 severe obesity with serious comorbidity and body mass index (BMI) of 40.0 to 44.9 in adult, unspecified obesity type Cindy Phillips)  PLAN:  Pre-Diabetes Cindy Phillips will continue to work on weight loss, exercise, and decreasing simple carbohydrates in her diet to help decrease the risk of diabetes. We discussed metformin including benefits and risks. She was informed that eating too many simple carbohydrates or too many calories at one sitting increases the  likelihood of GI side effects. Cindy Phillips agreed to increase metformin 500mg  to BID #60 with no refills and a prescription was written today. Cindy Phillips agreed to follow up with [place] as directed to monitor her progress in 4 weeks.  Vitamin D Deficiency Cindy Phillips was informed that low vitamin D levels contributes to fatigue and are associated with obesity, breast, and colon cancer. She agrees to continue to take prescription Vit D @50 ,000 IU every week #4 with no refills and will follow up for routine testing of vitamin D, at least 2-3 times per year. She was informed of the risk of over-replacement of vitamin D and agrees to not increase her dose unless she discusses this with [place] first. Cindy Phillips agrees to follow up as directed.  Obesity Cindy Phillips may or may not be currently in the action stage of change.  her goal is to continue with weight loss efforts. She has agreed to follow the Category 3 plan. Cindy Phillips has been instructed to work up to a goal of 150 minutes of combined cardio and strengthening exercise per week for weight loss and overall health benefits. We discussed the following Behavioral Modification Strategies today: increasing lean protein intake and decreasing simple carbohydrates.  Even though she is not doing well with her weight loss efforts we will continue to follow Cindy Phillips as she is at high risk of gaining significant weight while on anti-psychotics and our goal is to minimize weight gain.  Cindy Phillips has agreed to follow up with our clinic in 4 weeks. She was informed of  the importance of frequent follow up visits to maximize her success with intensive lifestyle modifications for her multiple health conditions.  ALLERGIES: No Known Allergies  MEDICATIONS: Current Outpatient Medications on File Prior to Visit  Medication Sig Dispense Refill  . atorvastatin (LIPITOR) 10 MG tablet Take 10 mg by mouth daily.    . hydrOXYzine (ATARAX/VISTARIL) 50 MG tablet Take 1 tablet (50 mg total) by mouth  every 4 (four) hours as needed for anxiety. 45 tablet 0  . lamoTRIgine (LAMICTAL) 25 MG tablet Take 1 tablet (25 mg total) by mouth daily. For mood stabilization 30 tablet 0  . medroxyPROGESTERone (DEPO-PROVERA) 150 MG/ML injection Inject 1 mL (150 mg total) into the muscle every 3 (three) months. For prevention of pregnancy 1 mL   . OLANZapine zydis (ZYPREXA) 15 MG disintegrating tablet Take 1 tablet (15 mg total) by mouth at bedtime. For mood control 30 tablet 0  . polyethylene glycol powder (GLYCOLAX/MIRALAX) powder Take 1 Container by mouth once.    . sodium chloride (OCEAN) 0.65 % SOLN nasal spray Place 1 spray into both nostrils as needed for congestion.  0   No current facility-administered medications on file prior to visit.     PAST MEDICAL HISTORY: Past Medical History:  Diagnosis Date  . Bipolar 1 disorder ([place])   . Bowel incontinence   . Depression   . Mental disability     PAST SURGICAL HISTORY: Past Surgical History:  Procedure Laterality Date  . FOOT SURGERY      SOCIAL HISTORY: Social History   Tobacco Use  . Smoking status: Never Smoker  . Smokeless tobacco: Never Used  Substance Use Topics  . Alcohol use: No  . Drug use: No    FAMILY HISTORY: Family History  Problem Relation Age of Onset  . High blood pressure Mother   . Anxiety disorder Mother   . Diabetes Maternal Grandmother    ROS: Review of Systems  Gastrointestinal: Negative for nausea and vomiting.  Musculoskeletal:       Negative for muscle weakness.  Endo/Heme/Allergies:       Negative for hypoglycemia. Positive for polyphagia.   PHYSICAL EXAM: Blood pressure 124/79, pulse (!) 111, height 5\' 11"  (1.803 m), weight 300 lb (136.1 kg), SpO2 98 %. Body mass index is 41.84 kg/m. Physical Exam Vitals signs reviewed.  Constitutional:      Appearance: Normal appearance. She is obese.  Cardiovascular:     Rate and Rhythm: Normal rate.  Pulmonary:     Effort: Pulmonary effort is normal.    Musculoskeletal: Normal range of motion.  Skin:    General: Skin is warm and dry.  Neurological:     Mental Status: She is alert and oriented to person, place, and time.  Psychiatric:        Mood and Affect: Mood normal.        Behavior: Behavior normal.    RECENT LABS AND TESTS: BMET    Component Value Date/Time   NA 140 04/23/2018 1234   K 4.0 04/23/2018 1234   CL 103 04/23/2018 1234   CO2 19 (L) 04/23/2018 1234   GLUCOSE 87 04/23/2018 1234   GLUCOSE 99 09/09/2014 1846   BUN 6 04/23/2018 1234   CREATININE 0.81 04/23/2018 1234   CALCIUM 9.5 04/23/2018 1234   GFRNONAA 99 04/23/2018 1234   GFRAA 114 04/23/2018 1234   Lab Results  Component Value Date   HGBA1C 5.3 04/23/2018   HGBA1C 5.4 11/26/2017   HGBA1C 5.4 09/11/2014  Lab Results  Component Value Date   INSULIN 31.2 (H) 04/23/2018   INSULIN 44.0 (H) 11/26/2017   CBC    Component Value Date/Time   WBC 6.4 11/26/2017 0859   WBC 6.9 09/09/2014 1846   RBC 4.42 11/26/2017 0859   RBC 3.85 (L) 09/09/2014 1846   HGB 13.0 11/26/2017 0859   HCT 38.8 11/26/2017 0859   PLT 172 09/09/2014 1846   MCV 88 11/26/2017 0859   MCH 29.4 11/26/2017 0859   MCH 30.1 09/09/2014 1846   MCHC 33.5 11/26/2017 0859   MCHC 34.0 09/09/2014 1846   RDW 13.7 11/26/2017 0859   LYMPHSABS 2.1 11/26/2017 0859   MONOABS 0.5 09/09/2014 1846   EOSABS 0.0 11/26/2017 0859   BASOSABS 0.0 11/26/2017 0859   Iron/TIBC/Ferritin/ %Sat No results found for: IRON, TIBC, FERRITIN, IRONPCTSAT Lipid Panel     Component Value Date/Time   CHOL 164 11/26/2017 0859   TRIG 117 11/26/2017 0859   HDL 44 11/26/2017 0859   CHOLHDL 4.1 09/11/2014 0630   VLDL 13 09/11/2014 0630   LDLCALC 97 11/26/2017 0859   Hepatic Function Panel     Component Value Date/Time   PROT 7.4 04/23/2018 1234   ALBUMIN 4.6 04/23/2018 1234   AST 17 04/23/2018 1234   ALT 14 04/23/2018 1234   ALKPHOS 113 04/23/2018 1234   BILITOT 0.3 04/23/2018 1234      Component Value  Date/Time   TSH 2.800 11/26/2017 0859   TSH 3.374 09/11/2014 0630   Results for KETRA, DUCHESNE (MRN [id number]) as of 09/16/2018 08:58  Ref. Range 04/23/2018 12:34  Vitamin D, 25-Hydroxy Latest Ref Range: 30.0 - 100.0 ng/mL 41.9    OBESITY BEHAVIORAL INTERVENTION VISIT  Today's visit was # 12   Starting weight: 289 lbs Starting date: 11/26/17 Today's weight : Weight: 300 lb (136.1 kg)  Today's date: 09/15/2018 Total lbs lost to date: 0 At least 15 minutes were spent on discussing the following behavioral intervention visit.  ASK: We discussed the diagnosis of obesity with Cindy Phillips today and Cindy Phillips agreed to give [place] permission to discuss obesity behavioral modification therapy today.  ASSESS: Cindy Phillips has the diagnosis of obesity and her BMI today is 41.8. Cindy Phillips is in the action stage of change.   ADVISE: Cindy Phillips was educated on the multiple health risks of obesity as well as the benefit of weight loss to improve her health. She was advised of the need for long term treatment and the importance of lifestyle modifications to improve her current health and to decrease her risk of future health problems.  AGREE: Multiple dietary modification options and treatment options were discussed and Cindy Phillips agreed to follow the recommendations documented in the above note.  ARRANGE: Cindy Phillips was educated on the importance of frequent visits to treat obesity as outlined per CMS and USPSTF guidelines and agreed to schedule her next follow up appointment today.  I, Marcille Blanco, am acting as transcriptionist for Starlyn Skeans, MD  I have reviewed the above documentation for accuracy and completeness, and I agree with the above. -Dennard Nip, MD

## 2018-09-19 ENCOUNTER — Ambulatory Visit (INDEPENDENT_AMBULATORY_CARE_PROVIDER_SITE_OTHER): Payer: Medicare Other | Admitting: Pulmonary Disease

## 2018-09-19 ENCOUNTER — Encounter: Payer: Self-pay | Admitting: Pulmonary Disease

## 2018-09-19 VITALS — BP 126/84 | HR 104 | Ht 71.0 in | Wt 306.0 lb

## 2018-09-19 DIAGNOSIS — Z9989 Dependence on other enabling machines and devices: Secondary | ICD-10-CM

## 2018-09-19 DIAGNOSIS — G4733 Obstructive sleep apnea (adult) (pediatric): Secondary | ICD-10-CM

## 2018-09-19 NOTE — Progress Notes (Signed)
Cindy Phillips    [id number]    27-May-1991  Primary Care Physician:Bland, Myra Rude, MD  Referring Physician: Lucianne Lei, [address], [place] [address]  Chief complaint:   History of obstructive sleep apnea She claims improved compliance with treatment  HPI: Diagnosed with severe obstructive sleep apnea Has been trying to use CPAP more often She was prescribed CPAP therapy through her primary doctors  She feels she is functioning better She gets enough hours of sleep at night Denies any shortness of breath at rest  Wakes up in the morning feeling like she has had a decent nights rest on most days   Outpatient Encounter Medications as of 09/19/2018  Medication Sig  . atorvastatin (LIPITOR) 10 MG tablet Take 10 mg by mouth daily.  . hydrOXYzine (ATARAX/VISTARIL) 50 MG tablet Take 1 tablet (50 mg total) by mouth every 4 (four) hours as needed for anxiety.  . lamoTRIgine (LAMICTAL) 25 MG tablet Take 1 tablet (25 mg total) by mouth daily. For mood stabilization  . medroxyPROGESTERone (DEPO-PROVERA) 150 MG/ML injection Inject 1 mL (150 mg total) into the muscle every 3 (three) months. For prevention of pregnancy  . metFORMIN (GLUCOPHAGE) 500 MG tablet Take 1 tablet (500 mg total) by mouth 2 (two) times daily with a meal.  . OLANZapine zydis (ZYPREXA) 15 MG disintegrating tablet Take 1 tablet (15 mg total) by mouth at bedtime. For mood control  . polyethylene glycol powder (GLYCOLAX/MIRALAX) powder Take 1 Container by mouth once.  . sodium chloride (OCEAN) 0.65 % SOLN nasal spray Place 1 spray into both nostrils as needed for congestion.  . Vitamin D, Ergocalciferol, (DRISDOL) 1.25 MG (50000 UT) CAPS capsule Take 1 capsule (50,000 Units total) by mouth every 7 (seven) days.   No facility-administered encounter medications on file as of 09/19/2018.     Allergies as of 09/19/2018  . (No Known Allergies)    Past Medical History:  Diagnosis Date  .  Bipolar 1 disorder ([place])   . Bowel incontinence   . Depression   . Mental disability     Past Surgical History:  Procedure Laterality Date  . FOOT SURGERY      Family History  Problem Relation Age of Onset  . High blood pressure Mother   . Anxiety disorder Mother   . Diabetes Maternal Grandmother     Social History   Socioeconomic History  . Marital status: Single    Spouse name: Not on file  . Number of children: 0  . Years of education: Not on file  . Highest education level: Not on file  Occupational History  . Occupation: Clinical research associate     Comment: [employer]   Social Needs  . Financial resource strain: Not on file  . Food insecurity:    Worry: Not on file    Inability: Not on file  . Transportation needs:    Medical: Not on file    Non-medical: Not on file  Tobacco Use  . Smoking status: Never Smoker  . Smokeless tobacco: Never Used  Substance and Sexual Activity  . Alcohol use: No  . Drug use: No  . Sexual activity: Not Currently  Lifestyle  . Physical activity:    Days per week: Not on file    Minutes per session: Not on file  . Stress: Not on file  Relationships  . Social connections:    Talks on phone: Not on file  Gets together: Not on file    Attends religious service: Not on file    Active member of club or organization: Not on file    Attends meetings of clubs or organizations: Not on file    Relationship status: Not on file  . Intimate partner violence:    Fear of current or ex partner: Not on file    Emotionally abused: Not on file    Physically abused: Not on file    Forced sexual activity: Not on file  Other Topics Concern  . Not on file  Social History Narrative  . Not on file    Review of Systems  Constitutional: Negative.   HENT: Negative.   Eyes: Negative.   Respiratory: Positive for apnea.   Cardiovascular: Negative.   Gastrointestinal: Negative.   Endocrine: Negative.   Genitourinary: Negative.   Psychiatric/Behavioral:  Positive for behavioral problems, dysphoric mood and sleep disturbance.    Vitals:   09/19/18 1330  BP: 126/84  Pulse: (!) 104  SpO2: 98%     Physical Exam  Constitutional: She appears well-developed.  obese  HENT:  Head: Normocephalic and atraumatic.  Eyes: Pupils are equal, round, and reactive to light. Conjunctivae and EOM are normal. Right eye exhibits no discharge. Left eye exhibits no discharge.  Neck: Normal range of motion. Neck supple. No tracheal deviation present. No thyromegaly present.  Cardiovascular: Normal rate and regular rhythm.  Pulmonary/Chest: Effort normal and breath sounds normal. No respiratory distress. She has no wheezes.  Abdominal: Soft. Bowel sounds are normal. She exhibits no distension. There is no abdominal tenderness.   Data Reviewed: Polysomnogram reviewed showing severe obstructive sleep apnea  Information from compliance record is suboptimal  Assessment:  .  Obstructive sleep apnea-improving compliance with treatment .  History of bipolar disorder  Plan/Recommendations:  .  We will request for more education from medical supply company to see whether this helps patients compliance  .  We did discuss the need to treat a sleep apnea  .  Treatment options disordered breathing discussed  .  Importance of weight loss and exercise discussed  Tentatively scheduled for 3 months follow-up  If she is not able to tolerate CPAP, referral for evaluation for an oral device was discussed   Sherrilyn Rist MD [facility] 09/19/2018, 1:50 PM  CC: Lucianne Lei, MD

## 2018-09-19 NOTE — Patient Instructions (Signed)
History of obstructive sleep apnea  Continue using CPAP on a regular basis  I will see you back in 3 months Call with any significant concerns  Continue with weight loss efforts  Try and make sure you get about 7 hours of sleep at night

## 2018-09-23 ENCOUNTER — Encounter (INDEPENDENT_AMBULATORY_CARE_PROVIDER_SITE_OTHER): Payer: Self-pay

## 2018-10-01 DIAGNOSIS — G4733 Obstructive sleep apnea (adult) (pediatric): Secondary | ICD-10-CM | POA: Diagnosis not present

## 2018-10-13 ENCOUNTER — Ambulatory Visit (INDEPENDENT_AMBULATORY_CARE_PROVIDER_SITE_OTHER): Payer: Medicare Other | Admitting: Family Medicine

## 2018-10-13 ENCOUNTER — Encounter (INDEPENDENT_AMBULATORY_CARE_PROVIDER_SITE_OTHER): Payer: Self-pay

## 2018-10-14 DIAGNOSIS — M7989 Other specified soft tissue disorders: Secondary | ICD-10-CM | POA: Diagnosis not present

## 2018-10-20 DIAGNOSIS — Z0000 Encounter for general adult medical examination without abnormal findings: Secondary | ICD-10-CM | POA: Diagnosis not present

## 2018-10-20 DIAGNOSIS — E1169 Type 2 diabetes mellitus with other specified complication: Secondary | ICD-10-CM | POA: Diagnosis not present

## 2018-10-21 DIAGNOSIS — E1165 Type 2 diabetes mellitus with hyperglycemia: Secondary | ICD-10-CM | POA: Diagnosis not present

## 2018-10-30 DIAGNOSIS — G4733 Obstructive sleep apnea (adult) (pediatric): Secondary | ICD-10-CM | POA: Diagnosis not present

## 2018-11-03 ENCOUNTER — Encounter (INDEPENDENT_AMBULATORY_CARE_PROVIDER_SITE_OTHER): Payer: Self-pay | Admitting: Family Medicine

## 2018-11-03 ENCOUNTER — Ambulatory Visit (INDEPENDENT_AMBULATORY_CARE_PROVIDER_SITE_OTHER): Payer: Medicare Other | Admitting: Family Medicine

## 2018-11-03 ENCOUNTER — Other Ambulatory Visit: Payer: Self-pay

## 2018-11-03 VITALS — BP 117/83 | HR 107 | Ht 71.0 in | Wt 301.0 lb

## 2018-11-03 DIAGNOSIS — Z6841 Body Mass Index (BMI) 40.0 and over, adult: Secondary | ICD-10-CM | POA: Diagnosis not present

## 2018-11-03 DIAGNOSIS — R7303 Prediabetes: Secondary | ICD-10-CM | POA: Diagnosis not present

## 2018-11-03 DIAGNOSIS — E559 Vitamin D deficiency, unspecified: Secondary | ICD-10-CM

## 2018-11-03 MED ORDER — VITAMIN D (ERGOCALCIFEROL) 1.25 MG (50000 UNIT) PO CAPS: 50000.0000 [IU] | ORAL_CAPSULE | ORAL | 0 refills | Status: DC

## 2018-11-03 MED ORDER — METFORMIN HCL 500 MG PO TABS: 500.0000 mg | ORAL_TABLET | Freq: Two times a day (BID) | ORAL | 0 refills | Status: DC

## 2018-11-03 NOTE — Progress Notes (Signed)
Office: [phone]  /  Fax: [phone]   HPI:   Chief Complaint: OBESITY Cindy Phillips is here to discuss her progress with her obesity treatment plan. She is on the Category 2 plan and is following her eating plan approximately 65 to 70 % of the time. She states she is walking 10 to 15 minutes 1 time per week. Cindy Phillips struggles to follow a structured meal plan. She is gaining weight again and would like to look at other options.  Her weight is (!) 301 lb (136.5 kg) today and has had a weight gain of 1 pound over a period of 7 weeks since her last visit. She has lost 0 lbs since starting treatment with [place].  Vitamin D Deficiency Cindy Phillips has a diagnosis of vitamin D deficiency. She is currently stable on vit D. Cindy Phillips denies nausea, vomiting, or muscle weakness.  Pre-Diabetes Cindy Phillips has a diagnosis of pre-diabetes based on her elevated Hgb A1c of 5.6 on 09/08/18 and was informed this puts her at greater risk of developing diabetes. She notes decreased polyphagia on metformin and continues to work on diet and exercise to decrease risk of diabetes.   ASSESSMENT AND PLAN:  Vitamin D deficiency - Plan: Vitamin D, Ergocalciferol, (DRISDOL) 1.25 MG (50000 UT) CAPS capsule  Prediabetes - Plan: metFORMIN (GLUCOPHAGE) 500 MG tablet  Class 3 severe obesity with serious comorbidity and body mass index (BMI) of 40.0 to 44.9 in adult, unspecified obesity type ([place])  PLAN:  Vitamin D Deficiency Cindy Phillips was informed that low vitamin D levels contribute to fatigue and are associated with obesity, breast, and colon cancer. Cindy Phillips agrees to continue to take prescription Vit D @50 ,000 IU every week #4 with no refills and will follow up for routine testing of vitamin D, at least 2-3 times per year. She was informed of the risk of over-replacement of vitamin D and agrees to not increase her dose unless she discusses this with [place] first. Cindy Phillips agrees to follow up in 4 weeks as directed.  Pre-Diabetes  Cindy Phillips will continue to work on weight loss, exercise, and decreasing simple carbohydrates in her diet to help decrease the risk of diabetes. She was informed that eating too many simple carbohydrates or too many calories at one sitting increases the likelihood of GI side effects. Cindy Phillips agreed to continue metformin 500 mg BID #30 with no refills and a prescription was written today. Cindy Phillips agreed to follow up with [place] as directed to monitor her progress.   Obesity Cindy Phillips is currently in the action stage of change. As such, her goal is to continue with weight loss efforts. She has agreed to follow a lower carbohydrate, vegetable, and lean protein rich diet plan. Cindy Phillips has been instructed to work up to a goal of 150 minutes of combined cardio and strengthening exercise per week for weight loss and overall health benefits. We discussed the following Behavioral Modification Strategies today: increasing lean protein intake, decreasing simple carbohydrates, work on meal planning and easy cooking plans, and emotional eating strategies.  Cindy Phillips has agreed to follow up with our clinic in 4 weeks. She was informed of the importance of frequent follow up visits to maximize her success with intensive lifestyle modifications for her multiple health conditions.  ALLERGIES: No Known Allergies  MEDICATIONS: Current Outpatient Medications on File Prior to Visit  Medication Sig Dispense Refill  . atorvastatin (LIPITOR) 10 MG tablet Take 10 mg by mouth daily.    . hydrOXYzine (ATARAX/VISTARIL) 50 MG tablet Take 1 tablet (50  mg total) by mouth every 4 (four) hours as needed for anxiety. 45 tablet 0  . lamoTRIgine (LAMICTAL) 25 MG tablet Take 1 tablet (25 mg total) by mouth daily. For mood stabilization 30 tablet 0  . medroxyPROGESTERone (DEPO-PROVERA) 150 MG/ML injection Inject 1 mL (150 mg total) into the muscle every 3 (three) months. For prevention of pregnancy 1 mL   . OLANZapine zydis (ZYPREXA) 15 MG  disintegrating tablet Take 1 tablet (15 mg total) by mouth at bedtime. For mood control 30 tablet 0  . polyethylene glycol powder (GLYCOLAX/MIRALAX) powder Take 1 Container by mouth once.    . sodium chloride (OCEAN) 0.65 % SOLN nasal spray Place 1 spray into both nostrils as needed for congestion.  0   No current facility-administered medications on file prior to visit.     PAST MEDICAL HISTORY: Past Medical History:  Diagnosis Date  . Bipolar 1 disorder ([place])   . Bowel incontinence   . Depression   . Mental disability     PAST SURGICAL HISTORY: Past Surgical History:  Procedure Laterality Date  . FOOT SURGERY      SOCIAL HISTORY: Social History   Tobacco Use  . Smoking status: Never Smoker  . Smokeless tobacco: Never Used  Substance Use Topics  . Alcohol use: No  . Drug use: No    FAMILY HISTORY: Family History  Problem Relation Age of Onset  . High blood pressure Mother   . Anxiety disorder Mother   . Diabetes Maternal Grandmother    ROS: Review of Systems  Constitutional: Negative for weight loss.  Gastrointestinal: Negative for nausea and vomiting.  Musculoskeletal:       Negative for muscle weakness.  Endo/Heme/Allergies:       Positive for polyphagia.   PHYSICAL EXAM: Blood pressure 117/83, pulse (!) 107, height 5\' 11"  (1.803 m), weight (!) 301 lb (136.5 kg), SpO2 98 %. Body mass index is 41.98 kg/m. Physical Exam Vitals signs reviewed.  Constitutional:      Appearance: Normal appearance. She is obese.  Cardiovascular:     Rate and Rhythm: Normal rate.  Pulmonary:     Effort: Pulmonary effort is normal.  Musculoskeletal: Normal range of motion.  Skin:    General: Skin is warm and dry.  Neurological:     Mental Status: She is alert and oriented to person, place, and time.  Psychiatric:        Mood and Affect: Mood normal.        Behavior: Behavior normal.    RECENT LABS AND TESTS: BMET    Component Value Date/Time   NA 140 04/23/2018  1234   K 4.0 04/23/2018 1234   CL 103 04/23/2018 1234   CO2 19 (L) 04/23/2018 1234   GLUCOSE 87 04/23/2018 1234   GLUCOSE 99 09/09/2014 1846   BUN 6 04/23/2018 1234   CREATININE 0.9 09/08/2018   CREATININE 0.81 04/23/2018 1234   CALCIUM 9.5 04/23/2018 1234   GFRNONAA 99 04/23/2018 1234   GFRAA 114 04/23/2018 1234   Lab Results  Component Value Date   HGBA1C 5.6 09/08/2018   HGBA1C 5.3 04/23/2018   HGBA1C 5.4 11/26/2017   HGBA1C 5.4 09/11/2014   Lab Results  Component Value Date   INSULIN 31.2 (H) 04/23/2018   INSULIN 44.0 (H) 11/26/2017   CBC    Component Value Date/Time   WBC 9.5 09/08/2018   WBC 6.9 09/09/2014 1846   RBC 4.42 11/26/2017 0859   RBC 3.85 (L) 09/09/2014 1846  HGB 12.8 09/08/2018   HGB 13.0 11/26/2017 0859   HCT 18 (A) 09/08/2018   HCT 38.8 11/26/2017 0859   PLT 233 09/08/2018   MCV 88 11/26/2017 0859   MCH 29.4 11/26/2017 0859   MCH 30.1 09/09/2014 1846   MCHC 33.5 11/26/2017 0859   MCHC 34.0 09/09/2014 1846   RDW 13.7 11/26/2017 0859   LYMPHSABS 2.1 11/26/2017 0859   MONOABS 0.5 09/09/2014 1846   EOSABS 0.0 11/26/2017 0859   BASOSABS 0.0 11/26/2017 0859   Iron/TIBC/Ferritin/ %Sat No results found for: IRON, TIBC, FERRITIN, IRONPCTSAT Lipid Panel     Component Value Date/Time   CHOL 172 09/08/2018   CHOL 164 11/26/2017 0859   TRIG 131 09/08/2018   HDL 59 09/08/2018   HDL 44 11/26/2017 0859   CHOLHDL 4.1 09/11/2014 0630   VLDL 13 09/11/2014 0630   LDLCALC 90 09/08/2018   LDLCALC 97 11/26/2017 0859   Hepatic Function Panel     Component Value Date/Time   PROT 7.4 04/23/2018 1234   ALBUMIN 4.6 04/23/2018 1234   AST 15 09/08/2018   ALT 25 09/08/2018   ALKPHOS 91 09/08/2018   BILITOT 0.3 04/23/2018 1234      Component Value Date/Time   TSH 2.800 11/26/2017 0859   TSH 3.374 09/11/2014 0630   Results for CALANDRIA, MULLINGS (MRN [id number]) as of 11/03/2018 16:09  Ref. Range 04/23/2018 12:34  Vitamin D, 25-Hydroxy Latest Ref Range:  30.0 - 100.0 ng/mL 41.9    OBESITY BEHAVIORAL INTERVENTION VISIT  Today's visit was # 13   Starting weight: 289 lbs Starting date: 11/26/17 Today's weight : Weight: (!) 301 lb (136.5 kg)  Today's date: 11/03/2018 Total lbs lost to date: 0 At least 15 minutes were spent on discussing the following behavioral intervention visit.    11/03/2018  Height 5\' 11"  (1.803 m)  Weight 301 lb (136.5 kg) (A)  BMI (Calculated) 42  BLOOD PRESSURE - SYSTOLIC 825  BLOOD PRESSURE - DIASTOLIC 83   Body Fat % 05.3 %  Total Body Water (lbs) 98.2 lbs   ASK: We discussed the diagnosis of obesity with [name] today and Alaiah agreed to give [place] permission to discuss obesity behavioral modification therapy today.  ASSESS: Keora has the diagnosis of obesity and her BMI today is 42. Datra is in the action stage of change.   ADVISE: Capitola was educated on the multiple health risks of obesity as well as the benefit of weight loss to improve her health. She was advised of the need for long term treatment and the importance of lifestyle modifications to improve her current health and to decrease her risk of future health problems.  AGREE: Multiple dietary modification options and treatment options were discussed and Drusilla agreed to follow the recommendations documented in the above note.  ARRANGE: Andy was educated on the importance of frequent visits to treat obesity as outlined per CMS and USPSTF guidelines and agreed to schedule her next follow up appointment today.  IMarcille Blanco, CMA, am acting as transcriptionist for Starlyn Skeans, MD  I have reviewed the above documentation for accuracy and completeness, and I agree with the above. -Dennard Nip, MD

## 2018-11-11 ENCOUNTER — Encounter (INDEPENDENT_AMBULATORY_CARE_PROVIDER_SITE_OTHER): Payer: Self-pay

## 2018-11-16 ENCOUNTER — Other Ambulatory Visit (INDEPENDENT_AMBULATORY_CARE_PROVIDER_SITE_OTHER): Payer: Self-pay | Admitting: Family Medicine

## 2018-11-16 DIAGNOSIS — R7303 Prediabetes: Secondary | ICD-10-CM

## 2018-11-18 ENCOUNTER — Other Ambulatory Visit (INDEPENDENT_AMBULATORY_CARE_PROVIDER_SITE_OTHER): Payer: Self-pay | Admitting: Family Medicine

## 2018-11-18 DIAGNOSIS — R7303 Prediabetes: Secondary | ICD-10-CM

## 2018-11-24 ENCOUNTER — Other Ambulatory Visit: Payer: Self-pay

## 2018-11-24 ENCOUNTER — Ambulatory Visit (INDEPENDENT_AMBULATORY_CARE_PROVIDER_SITE_OTHER): Payer: Medicare Other | Admitting: Family Medicine

## 2018-11-24 ENCOUNTER — Encounter (INDEPENDENT_AMBULATORY_CARE_PROVIDER_SITE_OTHER): Payer: Self-pay | Admitting: Family Medicine

## 2018-11-24 DIAGNOSIS — E559 Vitamin D deficiency, unspecified: Secondary | ICD-10-CM | POA: Diagnosis not present

## 2018-11-24 DIAGNOSIS — Z6841 Body Mass Index (BMI) 40.0 and over, adult: Secondary | ICD-10-CM | POA: Diagnosis not present

## 2018-11-24 DIAGNOSIS — R7303 Prediabetes: Secondary | ICD-10-CM | POA: Diagnosis not present

## 2018-11-24 DIAGNOSIS — E66813 Obesity, class 3: Secondary | ICD-10-CM

## 2018-11-24 MED ORDER — METFORMIN HCL 500 MG PO TABS: 500.0000 mg | ORAL_TABLET | Freq: Two times a day (BID) | ORAL | 0 refills | Status: DC

## 2018-11-24 MED ORDER — VITAMIN D (ERGOCALCIFEROL) 1.25 MG (50000 UNIT) PO CAPS: 50000.0000 [IU] | ORAL_CAPSULE | ORAL | 0 refills | Status: DC

## 2018-11-24 NOTE — Progress Notes (Signed)
Office: [phone]  /  Fax: [phone] TeleHealth Visit:  Cindy Phillips has verbally consented to this TeleHealth visit today. The patient is located at home, the provider is located at the [facility] office. The participants in this visit include the listed provider and patient. Cindy Phillips was unable to use Webex today and the Telehealth visit was conducted via telephone.  HPI:   Chief Complaint: OBESITY Cindy Phillips is here to discuss her progress with her obesity treatment plan. She is on the  follow a lower carbohydrate, vegetable, and lean protein rich diet plan and is following her eating plan approximately 60 % of the time. She states she is walking 20 to 30 minutes 2 to 5 times per week. Cindy Phillips is not currently motivated to make the changes needed for weight loss. She is trying to portion control and smarter choices, but has not been able to follow a structured plan. She thinks that she has gained some weight, but isn't weighing at home.  We were unable to weigh the patient today for this TeleHealth visit. She feels as if she has gained weight since her last visit. She has lost 0 lbs since starting treatment with [place].  Pre-Diabetes Cindy Phillips has a diagnosis of pre-diabetes based on her Hgb A1c that is starting to rise with increasing simple carbs in her diet. She was informed this puts her at greater risk of developing diabetes. She is still taking her metformin regularly and continues to work on diet and exercise to decrease risk of diabetes. She denies nausea, vomiting, or hypoglycemia.   Vitamin D Deficiency Cindy Phillips has a diagnosis of vitamin D deficiency. Her vit D is slowly improving, but is not yet at goal. Cindy Phillips denies nausea, vomiting, or muscle weakness.  ASSESSMENT AND PLAN:  Prediabetes - Plan: metFORMIN (GLUCOPHAGE) 500 MG tablet  Vitamin D deficiency - Plan: Vitamin D, Ergocalciferol, (DRISDOL) 1.25 MG (50000 UT) CAPS capsule  Class 3 severe obesity  with serious comorbidity and body mass index (BMI) of 40.0 to 44.9 in adult, unspecified obesity type [facility])  PLAN:  Pre-Diabetes Cindy Phillips will continue to work on weight loss, exercise, and decreasing simple carbohydrates in her diet to help decrease the risk of diabetes. She was informed that eating too many simple carbohydrates or too many calories at one sitting increases the likelihood of GI side effects. Cindy Phillips agreed to continue metformin 500 mg BID #60 with no refills and a prescription was written today. Cindy Phillips agreed to follow up with [place] as directed to monitor her progress in 3 weeks.   Vitamin D Deficiency Cindy Phillips was informed that low vitamin D levels contribute to fatigue and are associated with obesity, breast, and colon cancer. Cindy Phillips agrees to continue to take prescription Vit D @50 ,000 IU every week #4 with no refills and will follow up for routine testing of vitamin D, at least 2-3 times per year. She was informed of the risk of over-replacement of vitamin D and agrees to not increase her dose unless she discusses this with [place] first. Cindy Phillips agrees to follow up in 3 weeks as directed.  Obesity Cindy Phillips is not currently in the action stage of change. As such, her goal is to maintain weight for now during the [place] isolation and we will reassess her readiness to change at our next visit. She has agreed to follow the portion control and smarter choices plan. Cindy Phillips has been instructed to work up to a goal of 150 minutes of combined cardio and strengthening  exercise per week for weight loss and overall health benefits. We discussed the following Behavioral Modification Strategies today: work on meal planning and easy cooking plans and no skipping meals.  Cindy Phillips has agreed to follow up with our clinic in 3 weeks. She was informed of the importance of frequent follow up visits to maximize her success with intensive lifestyle modifications for her multiple health conditions.   ALLERGIES: No Known Allergies  MEDICATIONS: Current Outpatient Medications on File Prior to Visit  Medication Sig Dispense Refill  . atorvastatin (LIPITOR) 10 MG tablet Take 10 mg by mouth daily.    . hydrOXYzine (ATARAX/VISTARIL) 50 MG tablet Take 1 tablet (50 mg total) by mouth every 4 (four) hours as needed for anxiety. 45 tablet 0  . lamoTRIgine (LAMICTAL) 25 MG tablet Take 1 tablet (25 mg total) by mouth daily. For mood stabilization 30 tablet 0  . medroxyPROGESTERone (DEPO-PROVERA) 150 MG/ML injection Inject 1 mL (150 mg total) into the muscle every 3 (three) months. For prevention of pregnancy 1 mL   . OLANZapine zydis (ZYPREXA) 15 MG disintegrating tablet Take 1 tablet (15 mg total) by mouth at bedtime. For mood control 30 tablet 0  . polyethylene glycol powder (GLYCOLAX/MIRALAX) powder Take 1 Container by mouth once.    . sodium chloride (OCEAN) 0.65 % SOLN nasal spray Place 1 spray into both nostrils as needed for congestion.  0   No current facility-administered medications on file prior to visit.     PAST MEDICAL HISTORY: Past Medical History:  Diagnosis Date  . Bipolar 1 disorder ([place])   . Bowel incontinence   . Depression   . Mental disability     PAST SURGICAL HISTORY: Past Surgical History:  Procedure Laterality Date  . FOOT SURGERY      SOCIAL HISTORY: Social History   Tobacco Use  . Smoking status: Never Smoker  . Smokeless tobacco: Never Used  Substance Use Topics  . Alcohol use: No  . Drug use: No    FAMILY HISTORY: Family History  Problem Relation Age of Onset  . High blood pressure Mother   . Anxiety disorder Mother   . Diabetes Maternal Grandmother     ROS: Review of Systems  Gastrointestinal: Negative for nausea and vomiting.  Musculoskeletal:       Negative for muscle weakness.  Endo/Heme/Allergies:       Negative for hypoglycemia.    PHYSICAL EXAM: Pt in no acute distress  RECENT LABS AND TESTS: BMET    Component Value  Date/Time   NA 140 04/23/2018 1234   K 4.0 04/23/2018 1234   CL 103 04/23/2018 1234   CO2 19 (L) 04/23/2018 1234   GLUCOSE 87 04/23/2018 1234   GLUCOSE 99 09/09/2014 1846   BUN 6 04/23/2018 1234   CREATININE 0.9 09/08/2018   CREATININE 0.81 04/23/2018 1234   CALCIUM 9.5 04/23/2018 1234   GFRNONAA 99 04/23/2018 1234   GFRAA 114 04/23/2018 1234   Lab Results  Component Value Date   HGBA1C 5.6 09/08/2018   HGBA1C 5.3 04/23/2018   HGBA1C 5.4 11/26/2017   HGBA1C 5.4 09/11/2014   Lab Results  Component Value Date   INSULIN 31.2 (H) 04/23/2018   INSULIN 44.0 (H) 11/26/2017   CBC    Component Value Date/Time   WBC 9.5 09/08/2018   WBC 6.9 09/09/2014 1846   RBC 4.42 11/26/2017 0859   RBC 3.85 (L) 09/09/2014 1846   HGB 12.8 09/08/2018   HGB 13.0 11/26/2017 0859   HCT  18 (A) 09/08/2018   HCT 38.8 11/26/2017 0859   PLT 233 09/08/2018   MCV 88 11/26/2017 0859   MCH 29.4 11/26/2017 0859   MCH 30.1 09/09/2014 1846   MCHC 33.5 11/26/2017 0859   MCHC 34.0 09/09/2014 1846   RDW 13.7 11/26/2017 0859   LYMPHSABS 2.1 11/26/2017 0859   MONOABS 0.5 09/09/2014 1846   EOSABS 0.0 11/26/2017 0859   BASOSABS 0.0 11/26/2017 0859   Iron/TIBC/Ferritin/ %Sat No results found for: IRON, TIBC, FERRITIN, IRONPCTSAT Lipid Panel     Component Value Date/Time   CHOL 172 09/08/2018   CHOL 164 11/26/2017 0859   TRIG 131 09/08/2018   HDL 59 09/08/2018   HDL 44 11/26/2017 0859   CHOLHDL 4.1 09/11/2014 0630   VLDL 13 09/11/2014 0630   LDLCALC 90 09/08/2018   LDLCALC 97 11/26/2017 0859   Hepatic Function Panel     Component Value Date/Time   PROT 7.4 04/23/2018 1234   ALBUMIN 4.6 04/23/2018 1234   AST 15 09/08/2018   ALT 25 09/08/2018   ALKPHOS 91 09/08/2018   BILITOT 0.3 04/23/2018 1234      Component Value Date/Time   TSH 2.800 11/26/2017 0859   TSH 3.374 09/11/2014 0630   Results for ELYSSA, PENDELTON (MRN [id number]) as of 11/24/2018 12:09  Ref. Range 04/23/2018 12:34  Vitamin D,  25-Hydroxy Latest Ref Range: 30.0 - 100.0 ng/mL 41.9    I, Marcille Blanco, CMA, am acting as transcriptionist for Starlyn Skeans, MD I have reviewed the above documentation for accuracy and completeness, and I agree with the above. -Dennard Nip, MD

## 2018-11-30 DIAGNOSIS — G4733 Obstructive sleep apnea (adult) (pediatric): Secondary | ICD-10-CM | POA: Diagnosis not present

## 2018-12-04 ENCOUNTER — Encounter (INDEPENDENT_AMBULATORY_CARE_PROVIDER_SITE_OTHER): Payer: Self-pay | Admitting: Family Medicine

## 2018-12-10 ENCOUNTER — Encounter (INDEPENDENT_AMBULATORY_CARE_PROVIDER_SITE_OTHER): Payer: Self-pay | Admitting: Family Medicine

## 2018-12-15 ENCOUNTER — Encounter (INDEPENDENT_AMBULATORY_CARE_PROVIDER_SITE_OTHER): Payer: Self-pay | Admitting: Family Medicine

## 2018-12-15 ENCOUNTER — Other Ambulatory Visit: Payer: Self-pay

## 2018-12-15 ENCOUNTER — Ambulatory Visit (INDEPENDENT_AMBULATORY_CARE_PROVIDER_SITE_OTHER): Payer: Medicare Other | Admitting: Family Medicine

## 2018-12-15 DIAGNOSIS — R7303 Prediabetes: Secondary | ICD-10-CM

## 2018-12-15 DIAGNOSIS — Z6841 Body Mass Index (BMI) 40.0 and over, adult: Secondary | ICD-10-CM | POA: Diagnosis not present

## 2018-12-15 DIAGNOSIS — E559 Vitamin D deficiency, unspecified: Secondary | ICD-10-CM

## 2018-12-15 MED ORDER — VITAMIN D (ERGOCALCIFEROL) 1.25 MG (50000 UNIT) PO CAPS: 50000.0000 [IU] | ORAL_CAPSULE | ORAL | 0 refills | Status: DC

## 2018-12-15 MED ORDER — METFORMIN HCL 500 MG PO TABS: 500.0000 mg | ORAL_TABLET | Freq: Two times a day (BID) | ORAL | 0 refills | Status: DC

## 2018-12-15 NOTE — Progress Notes (Signed)
Office: [phone]  /  Fax: [phone] TeleHealth Visit:  NAZARETH NORENBERG has verbally consented to this TeleHealth visit today. The patient is located at home, the provider is located at the [employer] [facility] office. The participants in this visit include the listed provider and patient. Kyah was unable to use realtime audiovisual technology today and the telehealth visit was conducted via telephone.  HPI:   Chief Complaint: OBESITY Drishti is here to discuss her progress with her obesity treatment plan. She is on the Category 1 plan and is following her eating plan approximately 60 % of the time. She states she is walking 20 to 30 minutes 7 times per week. Amen feels that she has lost 1 to 2 pounds since her last visit. She is not following her plan closely, but she is trying to eat healthier. She is still snacking on sweets and chips frequently.  We were unable to weigh the patient today for this TeleHealth visit. She feels as if she has lost weight since her last visit. She has lost 0 lbs since starting treatment with [place].  Vitamin D Deficiency Iqra has a diagnosis of vitamin D deficiency. She is currently stable on vit D, but is not yet at goal. Alleta denies nausea, vomiting, or muscle weakness.  Pre-Diabetes Delmy has a diagnosis of pre-diabetes based on her elevated Hgb A1c of 5.6 on 09/08/18 and is stable. She was informed this puts her at greater risk of developing diabetes. She is tolerating metformin well, but she is struggling with her eating plan. She continues to work on diet and exercise to decrease risk of diabetes. She denies nausea, vomiting, or hypoglycemia.   ASSESSMENT AND PLAN:  Vitamin D deficiency - Plan: Vitamin D, Ergocalciferol, (DRISDOL) 1.25 MG (50000 UT) CAPS capsule  Prediabetes - Plan: metFORMIN (GLUCOPHAGE) 500 MG tablet  Class 3 severe obesity with serious comorbidity and body mass index (BMI) of 40.0 to 44.9 in adult, unspecified  obesity type ([place])  PLAN:  Vitamin D Deficiency Shawnay was informed that low vitamin D levels contribute to fatigue and are associated with obesity, breast, and colon cancer. Lynnetta agrees to continue to take prescription Vit D @50 ,000 IU every week #4 with no refills and will follow up for routine testing of vitamin D, at least 2-3 times per year. She was informed of the risk of over-replacement of vitamin D and agrees to not increase her dose unless she discusses this with [place] first. Ronda agrees to follow up in 2 weeks as directed.  Pre-Diabetes Tonilynn will continue to work on weight loss, exercise, and decreasing simple carbohydrates in her diet to help decrease the risk of diabetes. She was informed that eating too many simple carbohydrates or too many calories at one sitting increases the likelihood of GI side effects. Adisynn agreed to continue metformin 500 mg BID #60 with no refills and a prescription was written today. Hadiya agreed to follow up with [place] as directed to monitor her progress in 2 weeks.   Obesity Genita is currently in the action stage of change. As such, her goal is to continue with weight loss efforts. She has agreed to follow the Category 1 plan. Kaydyn has been instructed to work up to a goal of 150 minutes of combined cardio and strengthening exercise per week for weight loss and overall health benefits. We discussed the following Behavioral Modification Strategies today: ways to avoid night time snacking and better snacking choices.  Kinsey has agreed to  follow up with our clinic in 2 weeks. She was informed of the importance of frequent follow up visits to maximize her success with intensive lifestyle modifications for her multiple health conditions.  ALLERGIES: No Known Allergies  MEDICATIONS: Current Outpatient Medications on File Prior to Visit  Medication Sig Dispense Refill  . atorvastatin (LIPITOR) 10 MG tablet Take 10 mg by mouth daily.    .  hydrOXYzine (ATARAX/VISTARIL) 50 MG tablet Take 1 tablet (50 mg total) by mouth every 4 (four) hours as needed for anxiety. 45 tablet 0  . lamoTRIgine (LAMICTAL) 25 MG tablet Take 1 tablet (25 mg total) by mouth daily. For mood stabilization 30 tablet 0  . medroxyPROGESTERone (DEPO-PROVERA) 150 MG/ML injection Inject 1 mL (150 mg total) into the muscle every 3 (three) months. For prevention of pregnancy 1 mL   . OLANZapine zydis (ZYPREXA) 15 MG disintegrating tablet Take 1 tablet (15 mg total) by mouth at bedtime. For mood control 30 tablet 0  . polyethylene glycol powder (GLYCOLAX/MIRALAX) powder Take 1 Container by mouth once.    . sodium chloride (OCEAN) 0.65 % SOLN nasal spray Place 1 spray into both nostrils as needed for congestion.  0   No current facility-administered medications on file prior to visit.     PAST MEDICAL HISTORY: Past Medical History:  Diagnosis Date  . Bipolar 1 disorder ([place])   . Bowel incontinence   . Depression   . Mental disability     PAST SURGICAL HISTORY: Past Surgical History:  Procedure Laterality Date  . FOOT SURGERY      SOCIAL HISTORY: Social History   Tobacco Use  . Smoking status: Never Smoker  . Smokeless tobacco: Never Used  Substance Use Topics  . Alcohol use: No  . Drug use: No    FAMILY HISTORY: Family History  Problem Relation Age of Onset  . High blood pressure Mother   . Anxiety disorder Mother   . Diabetes Maternal Grandmother     ROS: Review of Systems  Gastrointestinal: Negative for nausea and vomiting.  Musculoskeletal:       Negative for muscle weakness.  Endo/Heme/Allergies:       Negative for hypoglycemia.    PHYSICAL EXAM: Pt in no acute distress  RECENT LABS AND TESTS: BMET    Component Value Date/Time   NA 140 04/23/2018 1234   K 4.0 04/23/2018 1234   CL 103 04/23/2018 1234   CO2 19 (L) 04/23/2018 1234   GLUCOSE 87 04/23/2018 1234   GLUCOSE 99 09/09/2014 1846   BUN 6 04/23/2018 1234    CREATININE 0.9 09/08/2018   CREATININE 0.81 04/23/2018 1234   CALCIUM 9.5 04/23/2018 1234   GFRNONAA 99 04/23/2018 1234   GFRAA 114 04/23/2018 1234   Lab Results  Component Value Date   HGBA1C 5.6 09/08/2018   HGBA1C 5.3 04/23/2018   HGBA1C 5.4 11/26/2017   HGBA1C 5.4 09/11/2014   Lab Results  Component Value Date   INSULIN 31.2 (H) 04/23/2018   INSULIN 44.0 (H) 11/26/2017   CBC    Component Value Date/Time   WBC 9.5 09/08/2018   WBC 6.9 09/09/2014 1846   RBC 4.42 11/26/2017 0859   RBC 3.85 (L) 09/09/2014 1846   HGB 12.8 09/08/2018   HGB 13.0 11/26/2017 0859   HCT 18 (A) 09/08/2018   HCT 38.8 11/26/2017 0859   PLT 233 09/08/2018   MCV 88 11/26/2017 0859   MCH 29.4 11/26/2017 0859   MCH 30.1 09/09/2014 1846   MCHC  33.5 11/26/2017 0859   MCHC 34.0 09/09/2014 1846   RDW 13.7 11/26/2017 0859   LYMPHSABS 2.1 11/26/2017 0859   MONOABS 0.5 09/09/2014 1846   EOSABS 0.0 11/26/2017 0859   BASOSABS 0.0 11/26/2017 0859   Iron/TIBC/Ferritin/ %Sat No results found for: IRON, TIBC, FERRITIN, IRONPCTSAT Lipid Panel     Component Value Date/Time   CHOL 172 09/08/2018   CHOL 164 11/26/2017 0859   TRIG 131 09/08/2018   HDL 59 09/08/2018   HDL 44 11/26/2017 0859   CHOLHDL 4.1 09/11/2014 0630   VLDL 13 09/11/2014 0630   LDLCALC 90 09/08/2018   LDLCALC 97 11/26/2017 0859   Hepatic Function Panel     Component Value Date/Time   PROT 7.4 04/23/2018 1234   ALBUMIN 4.6 04/23/2018 1234   AST 15 09/08/2018   ALT 25 09/08/2018   ALKPHOS 91 09/08/2018   BILITOT 0.3 04/23/2018 1234      Component Value Date/Time   TSH 2.800 11/26/2017 0859   TSH 3.374 09/11/2014 0630    Results for BENNYE, NIX (MRN [id number]) as of 12/15/2018 15:33  Ref. Range 04/23/2018 12:34  Vitamin D, 25-Hydroxy Latest Ref Range: 30.0 - 100.0 ng/mL 41.9    I, Marcille Blanco, CMA, am acting as transcriptionist for Starlyn Skeans, MD I have reviewed the above documentation for accuracy and  completeness, and I agree with the above. -Dennard Nip, MD

## 2018-12-26 ENCOUNTER — Ambulatory Visit: Payer: Self-pay | Admitting: Pulmonary Disease

## 2018-12-29 ENCOUNTER — Ambulatory Visit (INDEPENDENT_AMBULATORY_CARE_PROVIDER_SITE_OTHER): Payer: Medicare Other | Admitting: Family Medicine

## 2018-12-29 ENCOUNTER — Other Ambulatory Visit: Payer: Self-pay

## 2018-12-29 ENCOUNTER — Encounter (INDEPENDENT_AMBULATORY_CARE_PROVIDER_SITE_OTHER): Payer: Self-pay | Admitting: Family Medicine

## 2018-12-29 DIAGNOSIS — R0602 Shortness of breath: Secondary | ICD-10-CM

## 2018-12-29 DIAGNOSIS — Z6841 Body Mass Index (BMI) 40.0 and over, adult: Secondary | ICD-10-CM | POA: Diagnosis not present

## 2018-12-30 DIAGNOSIS — G4733 Obstructive sleep apnea (adult) (pediatric): Secondary | ICD-10-CM | POA: Diagnosis not present

## 2018-12-30 NOTE — Progress Notes (Signed)
Office: [phone]  /  Fax: [phone] TeleHealth Visit:  Cindy Phillips has verbally consented to this TeleHealth visit today. The patient is located at home, the provider is located at the [employer] [facility] office. The participants in this visit include the listed provider and patient. Cindy Phillips was unable to use realtime audiovisual technology today and the telehealth visit was conducted via telephone.  HPI:   Chief Complaint: OBESITY Cindy Phillips is here to discuss her progress with her obesity treatment plan. She was advised to follow the Category 1 plan and is following her eating plan approximately 60 % of the time. She states she is walking 10 to 20 minutes 7 times per week. Cindy Phillips is currently struggling to stay on track. She suspects that she has gained weight and is not following her plan closely. She has increased walking and hoped this would be enough to lose weight, but it was not. She is not really able to follow a structured plan at this point.  We were unable to weigh the patient today for this TeleHealth visit. She feels as if she has gained weight since her last visit. She has lost 0 lbs since starting treatment with [place].  Shortness of Breath Cindy Phillips has started increasing walking and thinks that she is getting shorter of breath earlier than she used to. She hasn't done much walking in the last month and feels this shortness of breath is about the same as last month. Her allergies are worsening and she denies fever, cough or orthopnea. The shortness of breath resolves quickly when she rests and only happens when she pushes herself. Cindy Phillips denies a history of asthma, COPD, smoking, or living with smokers. She denies chest pain.  ASSESSMENT AND PLAN:  SOB (shortness of breath)  Class 3 severe obesity with serious comorbidity and body mass index (BMI) of 40.0 to 44.9 in adult, unspecified obesity type ([facility])  PLAN:  Shortness of Breath I am unable to auscultate her  lungs, but she is speaking freely without labored breathing. It appears likely to be related to exercise intolerance due to decreased activity. Cindy Phillips agreed to continue walking and let me or her PCP know if her shortness of breath worsens at any time. Cindy Phillips agreed with this plan and agreed to follow up 2 weeks.  I spent > than 50% of the 25 minute visit on counseling as documented in the note.  Obesity Cindy Phillips is currently in the action stage of change. As such, her goal is to maintain weight for now. She will continue to follow and help her weight loss when she is ready. She has agreed to portion control better and make smarter food choices, such as increase vegetables and decrease simple carbohydrates.  Cindy Phillips has been instructed to work up to a goal of 150 minutes of combined cardio and strengthening exercise per week for weight loss and overall health benefits. We discussed the following Behavioral Modification Strategies today: increasing lean protein intake, work on meal planning and easy cooking plans,no skipping meals, better snacking choices, and ways to avoid boredom eating.  Cindy Phillips has agreed to follow up with our clinic in 2 weeks. She was informed of the importance of frequent follow up visits to maximize her success with intensive lifestyle modifications for her multiple health conditions.  ALLERGIES: No Known Allergies  MEDICATIONS: Current Outpatient Medications on File Prior to Visit  Medication Sig Dispense Refill   atorvastatin (LIPITOR) 10 MG tablet Take 10 mg by mouth daily.  hydrOXYzine (ATARAX/VISTARIL) 50 MG tablet Take 1 tablet (50 mg total) by mouth every 4 (four) hours as needed for anxiety. 45 tablet 0   lamoTRIgine (LAMICTAL) 25 MG tablet Take 1 tablet (25 mg total) by mouth daily. For mood stabilization 30 tablet 0   medroxyPROGESTERone (DEPO-PROVERA) 150 MG/ML injection Inject 1 mL (150 mg total) into the muscle every 3 (three) months. For prevention  of pregnancy 1 mL    metFORMIN (GLUCOPHAGE) 500 MG tablet Take 1 tablet (500 mg total) by mouth 2 (two) times daily with a meal. 60 tablet 0   OLANZapine zydis (ZYPREXA) 15 MG disintegrating tablet Take 1 tablet (15 mg total) by mouth at bedtime. For mood control 30 tablet 0   polyethylene glycol powder (GLYCOLAX/MIRALAX) powder Take 1 Container by mouth once.     sodium chloride (OCEAN) 0.65 % SOLN nasal spray Place 1 spray into both nostrils as needed for congestion.  0   Vitamin D, Ergocalciferol, (DRISDOL) 1.25 MG (50000 UT) CAPS capsule Take 1 capsule (50,000 Units total) by mouth every 7 (seven) days. 4 capsule 0   No current facility-administered medications on file prior to visit.     PAST MEDICAL HISTORY: Past Medical History:  Diagnosis Date   Bipolar 1 disorder ([place])    Bowel incontinence    Depression    Mental disability     PAST SURGICAL HISTORY: Past Surgical History:  Procedure Laterality Date   FOOT SURGERY      SOCIAL HISTORY: Social History   Tobacco Use   Smoking status: Never Smoker   Smokeless tobacco: Never Used  Substance Use Topics   Alcohol use: No   Drug use: No    FAMILY HISTORY: Family History  Problem Relation Age of Onset   High blood pressure Mother    Anxiety disorder Mother    Diabetes Maternal Grandmother     ROS: Review of Systems  Constitutional: Negative for fever.  Respiratory: Negative for cough.   Cardiovascular: Negative for chest pain and orthopnea.    PHYSICAL EXAM: Pt in no acute distress  RECENT LABS AND TESTS: BMET    Component Value Date/Time   NA 140 04/23/2018 1234   K 4.0 04/23/2018 1234   CL 103 04/23/2018 1234   CO2 19 (L) 04/23/2018 1234   GLUCOSE 87 04/23/2018 1234   GLUCOSE 99 09/09/2014 1846   BUN 6 04/23/2018 1234   CREATININE 0.9 09/08/2018   CREATININE 0.81 04/23/2018 1234   CALCIUM 9.5 04/23/2018 1234   GFRNONAA 99 04/23/2018 1234   GFRAA 114 04/23/2018 1234   Lab  Results  Component Value Date   HGBA1C 5.6 09/08/2018   HGBA1C 5.3 04/23/2018   HGBA1C 5.4 11/26/2017   HGBA1C 5.4 09/11/2014   Lab Results  Component Value Date   INSULIN 31.2 (H) 04/23/2018   INSULIN 44.0 (H) 11/26/2017   CBC    Component Value Date/Time   WBC 9.5 09/08/2018   WBC 6.9 09/09/2014 1846   RBC 4.42 11/26/2017 0859   RBC 3.85 (L) 09/09/2014 1846   HGB 12.8 09/08/2018   HGB 13.0 11/26/2017 0859   HCT 18 (A) 09/08/2018   HCT 38.8 11/26/2017 0859   PLT 233 09/08/2018   MCV 88 11/26/2017 0859   MCH 29.4 11/26/2017 0859   MCH 30.1 09/09/2014 1846   MCHC 33.5 11/26/2017 0859   MCHC 34.0 09/09/2014 1846   RDW 13.7 11/26/2017 0859   LYMPHSABS 2.1 11/26/2017 0859   MONOABS 0.5 09/09/2014 1846  EOSABS 0.0 11/26/2017 0859   BASOSABS 0.0 11/26/2017 0859   Iron/TIBC/Ferritin/ %Sat No results found for: IRON, TIBC, FERRITIN, IRONPCTSAT Lipid Panel     Component Value Date/Time   CHOL 172 09/08/2018   CHOL 164 11/26/2017 0859   TRIG 131 09/08/2018   HDL 59 09/08/2018   HDL 44 11/26/2017 0859   CHOLHDL 4.1 09/11/2014 0630   VLDL 13 09/11/2014 0630   LDLCALC 90 09/08/2018   LDLCALC 97 11/26/2017 0859   Hepatic Function Panel     Component Value Date/Time   PROT 7.4 04/23/2018 1234   ALBUMIN 4.6 04/23/2018 1234   AST 15 09/08/2018   ALT 25 09/08/2018   ALKPHOS 91 09/08/2018   BILITOT 0.3 04/23/2018 1234      Component Value Date/Time   TSH 2.800 11/26/2017 0859   TSH 3.374 09/11/2014 0630   Results for GERTURDE, KUBA (MRN [id number]) as of 12/30/2018 16:05  Ref. Range 04/23/2018 12:34  Vitamin D, 25-Hydroxy Latest Ref Range: 30.0 - 100.0 ng/mL 41.9    I, Marcille Blanco, CMA, am acting as transcriptionist for Starlyn Skeans, MD I have reviewed the above documentation for accuracy and completeness, and I agree with the above. -Dennard Nip, MD

## 2019-01-14 ENCOUNTER — Encounter (INDEPENDENT_AMBULATORY_CARE_PROVIDER_SITE_OTHER): Payer: Self-pay | Admitting: Family Medicine

## 2019-01-14 ENCOUNTER — Ambulatory Visit (INDEPENDENT_AMBULATORY_CARE_PROVIDER_SITE_OTHER): Payer: Medicare Other | Admitting: Family Medicine

## 2019-01-14 ENCOUNTER — Other Ambulatory Visit: Payer: Self-pay

## 2019-01-14 DIAGNOSIS — R7303 Prediabetes: Secondary | ICD-10-CM | POA: Diagnosis not present

## 2019-01-14 DIAGNOSIS — Z6841 Body Mass Index (BMI) 40.0 and over, adult: Secondary | ICD-10-CM

## 2019-01-14 MED ORDER — METFORMIN HCL 500 MG PO TABS: ORAL_TABLET | ORAL | 0 refills | Status: DC

## 2019-01-15 NOTE — Progress Notes (Signed)
Office: [phone]  /  Fax: [phone] TeleHealth Visit:  Cindy Phillips has verbally consented to this TeleHealth visit today. The patient is located in community, the provider is located at the [employer] [facility] office. The participants in this visit include the listed provider and patient. The visit was conducted today via audio.  HPI:   Chief Complaint: OBESITY Cindy Phillips is here to discuss her progress with her obesity treatment plan. She is on the  portion control better and make smarter food choices, such as increase vegetables and decrease simple carbohydrates  and is following her eating plan approximately 50 % of the time. She states she is exercising by walking for 10-15 minutes 5-7 times per week. Cindy Phillips has lost motivation to follow her plan closely, she is mostly just portion control and making smarter choices but continues to gain weight. She is eating out a lot again but trying to walk for exercise.  We were unable to weigh the patient today for this TeleHealth visit. She feels as if she has gained weight since her last visit. She has lost 0 lbs since starting treatment with [place].  Pre-Diabetes Cindy Phillips has a diagnosis of prediabetes based on her elevated HgA1c and was informed this puts her at greater risk of developing diabetes. She is not taking second dose of Metformin, she forgot she was suppose to do that and continues to work on diet and exercise to decrease risk of diabetes. She has increased PM snacking at the same time.  She denies nausea or hypoglycemia.   ASSESSMENT AND PLAN:  Prediabetes - Plan: metFORMIN (GLUCOPHAGE) 500 MG tablet  Class 3 severe obesity with serious comorbidity and body mass index (BMI) of 40.0 to 44.9 in adult, unspecified obesity type [facility])  PLAN: Pre-Diabetes Cindy Phillips will continue to work on weight loss, exercise, and decreasing simple carbohydrates in her diet to help decrease the risk of diabetes. We dicussed metformin  including benefits and risks. She was informed that eating too many simple carbohydrates or too many calories at one sitting increases the likelihood of GI side effects. Andora agrees to increase metformin to 1,000 mg qAM #60 wih no refills. She agrees to get back to prescribed diet strictly.  Cindy Phillips agreed to follow up with [place] as directed to monitor her progress.   I spent > than 50% of the 15 minute visit on counseling as documented in the note.  Obesity Cindy Phillips is currently in the action stage of change. As such, her goal is to maintain weight for now She has agreed to portion control better and make smarter food choices, such as increase vegetables and decrease simple carbohydrates  Cindy Phillips has been instructed to work up to a goal of 150 minutes of combined cardio and strengthening exercise per week for weight loss and overall health benefits. We discussed the following Behavioral Modification Stratagies today: decrease eating out, work on meal planning and easy cooking plans and ways to avoid night time snacking She is not currently in the action stage of change. She was encouraged to try to maintain her weight until she is ready to make needed changes.   Cindy Phillips has agreed to follow up with our clinic in  3 weeks. She was informed of the importance of frequent follow up visits to maximize her success with intensive lifestyle modifications for her multiple health conditions.  ALLERGIES: No Known Allergies  MEDICATIONS: Current Outpatient Medications on File Prior to Visit  Medication Sig Dispense Refill  . atorvastatin (LIPITOR)  10 MG tablet Take 10 mg by mouth daily.    . hydrOXYzine (ATARAX/VISTARIL) 50 MG tablet Take 1 tablet (50 mg total) by mouth every 4 (four) hours as needed for anxiety. 45 tablet 0  . lamoTRIgine (LAMICTAL) 25 MG tablet Take 1 tablet (25 mg total) by mouth daily. For mood stabilization 30 tablet 0  . medroxyPROGESTERone (DEPO-PROVERA) 150 MG/ML injection Inject  1 mL (150 mg total) into the muscle every 3 (three) months. For prevention of pregnancy 1 mL   . OLANZapine zydis (ZYPREXA) 15 MG disintegrating tablet Take 1 tablet (15 mg total) by mouth at bedtime. For mood control 30 tablet 0  . polyethylene glycol powder (GLYCOLAX/MIRALAX) powder Take 1 Container by mouth once.    . sodium chloride (OCEAN) 0.65 % SOLN nasal spray Place 1 spray into both nostrils as needed for congestion.  0  . Vitamin D, Ergocalciferol, (DRISDOL) 1.25 MG (50000 UT) CAPS capsule Take 1 capsule (50,000 Units total) by mouth every 7 (seven) days. 4 capsule 0   No current facility-administered medications on file prior to visit.     PAST MEDICAL HISTORY: Past Medical History:  Diagnosis Date  . Bipolar 1 disorder ([place])   . Bowel incontinence   . Depression   . Mental disability     PAST SURGICAL HISTORY: Past Surgical History:  Procedure Laterality Date  . FOOT SURGERY      SOCIAL HISTORY: Social History   Tobacco Use  . Smoking status: Never Smoker  . Smokeless tobacco: Never Used  Substance Use Topics  . Alcohol use: No  . Drug use: No    FAMILY HISTORY: Family History  Problem Relation Age of Onset  . High blood pressure Mother   . Anxiety disorder Mother   . Diabetes Maternal Grandmother     ROS: Review of Systems  Gastrointestinal: Negative for nausea.       Negative for hypoglycemia    PHYSICAL EXAM: Pt in no acute distress  RECENT LABS AND TESTS: BMET    Component Value Date/Time   NA 140 04/23/2018 1234   K 4.0 04/23/2018 1234   CL 103 04/23/2018 1234   CO2 19 (L) 04/23/2018 1234   GLUCOSE 87 04/23/2018 1234   GLUCOSE 99 09/09/2014 1846   BUN 6 04/23/2018 1234   CREATININE 0.9 09/08/2018   CREATININE 0.81 04/23/2018 1234   CALCIUM 9.5 04/23/2018 1234   GFRNONAA 99 04/23/2018 1234   GFRAA 114 04/23/2018 1234   Lab Results  Component Value Date   HGBA1C 5.6 09/08/2018   HGBA1C 5.3 04/23/2018   HGBA1C 5.4 11/26/2017    HGBA1C 5.4 09/11/2014   Lab Results  Component Value Date   INSULIN 31.2 (H) 04/23/2018   INSULIN 44.0 (H) 11/26/2017   CBC    Component Value Date/Time   WBC 9.5 09/08/2018   WBC 6.9 09/09/2014 1846   RBC 4.42 11/26/2017 0859   RBC 3.85 (L) 09/09/2014 1846   HGB 12.8 09/08/2018   HGB 13.0 11/26/2017 0859   HCT 18 (A) 09/08/2018   HCT 38.8 11/26/2017 0859   PLT 233 09/08/2018   MCV 88 11/26/2017 0859   MCH 29.4 11/26/2017 0859   MCH 30.1 09/09/2014 1846   MCHC 33.5 11/26/2017 0859   MCHC 34.0 09/09/2014 1846   RDW 13.7 11/26/2017 0859   LYMPHSABS 2.1 11/26/2017 0859   MONOABS 0.5 09/09/2014 1846   EOSABS 0.0 11/26/2017 0859   BASOSABS 0.0 11/26/2017 0859   Iron/TIBC/Ferritin/ %Sat No results  found for: IRON, TIBC, FERRITIN, IRONPCTSAT Lipid Panel     Component Value Date/Time   CHOL 172 09/08/2018   CHOL 164 11/26/2017 0859   TRIG 131 09/08/2018   HDL 59 09/08/2018   HDL 44 11/26/2017 0859   CHOLHDL 4.1 09/11/2014 0630   VLDL 13 09/11/2014 0630   LDLCALC 90 09/08/2018   LDLCALC 97 11/26/2017 0859   Hepatic Function Panel     Component Value Date/Time   PROT 7.4 04/23/2018 1234   ALBUMIN 4.6 04/23/2018 1234   AST 15 09/08/2018   ALT 25 09/08/2018   ALKPHOS 91 09/08/2018   BILITOT 0.3 04/23/2018 1234      Component Value Date/Time   TSH 2.800 11/26/2017 0859   TSH 3.374 09/11/2014 0630      I, Frances Maywood, am acting as transcriptionist for Dr. Dennard Nip I have reviewed the above documentation for accuracy and completeness, and I agree with the above. -Dennard Nip, MD

## 2019-01-20 DIAGNOSIS — E1169 Type 2 diabetes mellitus with other specified complication: Secondary | ICD-10-CM | POA: Diagnosis not present

## 2019-01-20 DIAGNOSIS — Z7189 Other specified counseling: Secondary | ICD-10-CM | POA: Diagnosis not present

## 2019-01-23 ENCOUNTER — Other Ambulatory Visit: Payer: Self-pay

## 2019-01-23 NOTE — Patient Outreach (Signed)
[facility]) Care Management  01/23/2019  Cindy Phillips 09-19-1990 [id number]   Medication Adherence call to [name]  [place] Identifiers Verify spoke with patient she is due on Atorvastatin 10  Mg she is taking 1 tablet daily and has enough for a few more days,patient ask if we can call [facility] and order this medication for her. [name] is showing past due under [place].   [facility] Management Direct Dial [phone]  Fax [phone] [email or web address]

## 2019-01-28 ENCOUNTER — Encounter (INDEPENDENT_AMBULATORY_CARE_PROVIDER_SITE_OTHER): Payer: Self-pay | Admitting: Family Medicine

## 2019-01-28 ENCOUNTER — Ambulatory Visit (INDEPENDENT_AMBULATORY_CARE_PROVIDER_SITE_OTHER): Payer: Medicare Other | Admitting: Family Medicine

## 2019-01-28 ENCOUNTER — Other Ambulatory Visit: Payer: Self-pay

## 2019-01-28 DIAGNOSIS — Z6841 Body Mass Index (BMI) 40.0 and over, adult: Secondary | ICD-10-CM | POA: Diagnosis not present

## 2019-01-28 DIAGNOSIS — E119 Type 2 diabetes mellitus without complications: Secondary | ICD-10-CM

## 2019-01-28 DIAGNOSIS — R7303 Prediabetes: Secondary | ICD-10-CM | POA: Diagnosis not present

## 2019-01-28 MED ORDER — METFORMIN HCL 500 MG PO TABS: ORAL_TABLET | ORAL | 0 refills | Status: DC

## 2019-01-30 DIAGNOSIS — G4733 Obstructive sleep apnea (adult) (pediatric): Secondary | ICD-10-CM | POA: Diagnosis not present

## 2019-02-02 NOTE — Progress Notes (Signed)
Office: [phone]  /  Fax: [phone] TeleHealth Visit:  Cindy Phillips has verbally consented to this TeleHealth visit today. The patient is located at home, the provider is located at the [facility] office. The participants in this visit include the listed provider and patient. Cindy Phillips was unable to use Face Time today and the telehealth visit was conducted via telephone.  HPI:   Chief Complaint: OBESITY Cindy Phillips is here to discuss her progress with her obesity treatment plan. She is on the Category 3 plan and is following her eating plan approximately 30 % of the time. She states she is walking 10 to 20 minutes 3 to 4 times per week. Cindy Phillips has been struggling to follow a structured plan and feels that she is gaining weight again. She struggles most in the evenings with hunger and boredom eating.  We were unable to weigh the patient today for this TeleHealth visit. She feels as if she has gained weight since her last visit. She has lost 0 lbs since starting treatment with [place].  Diabetes II Cindy Phillips has a diagnosis of diabetes type II. Cindy Phillips states that she does not check blood sugars at home. She was changed to metformin 2 tablets every morning and feels that her polyphagia has decreased in the day time, but she is struggling again in the evening. Her last A1c was 5.6 on 09/08/18 and was well controlled. She has been working on intensive lifestyle modifications including diet, exercise, and weight loss to help control her blood glucose levels.  ASSESSMENT AND PLAN:  Type 2 diabetes mellitus without complication, without long-term current use of insulin (HCC) - Plan: metFORMIN (GLUCOPHAGE) 500 MG tablet  Prediabetes  Class 3 severe obesity with serious comorbidity and body mass index (BMI) of 40.0 to 44.9 in adult, unspecified obesity type ([place])  PLAN:  Diabetes II Cindy Phillips has been given extensive diabetes education by myself today including ideal fasting and  post-prandial blood glucose readings, individual ideal Hgb A1c goals, and hypoglycemia prevention. We discussed the importance of good blood sugar control to decrease the likelihood of diabetic complications such as nephropathy, neuropathy, limb loss, blindness, coronary artery disease, and death. We discussed the importance of intensive lifestyle modification including diet, exercise, and weight loss as the first line treatment for diabetes. Cindy Phillips agrees to increase her metformin to 500 mg, 2 tablets qAM and 1 tablet qPM #90 with no refills. She agrees to get back to her diet and we will continue to monitor her progress. Cindy Phillips will follow up at the agreed upon time in 2 weeks.  I spent > than 50% of the 15 minute visit on counseling as documented in the note.  Obesity Cindy Phillips is currently in the action stage of change. As such, her goal is to continue with weight loss efforts. She has agreed to follow the Category 3 plan. Cindy Phillips has been instructed to work up to a goal of 150 minutes of combined cardio and strengthening exercise per week for weight loss and overall health benefits. We discussed the following Behavioral Modification Strategies today: work on meal planning and easy cooking plans, ways to avoid boredom eating, and ways to avoid night time snacking.  Cindy Phillips has agreed to follow up with our clinic in 2 weeks. She was informed of the importance of frequent follow up visits to maximize her success with intensive lifestyle modifications for her multiple health conditions.  ALLERGIES: No Known Allergies  MEDICATIONS: Current Outpatient Medications on File Prior to Visit  Medication Sig Dispense Refill   atorvastatin (LIPITOR) 10 MG tablet Take 10 mg by mouth daily.     hydrOXYzine (ATARAX/VISTARIL) 50 MG tablet Take 1 tablet (50 mg total) by mouth every 4 (four) hours as needed for anxiety. 45 tablet 0   lamoTRIgine (LAMICTAL) 25 MG tablet Take 1 tablet (25 mg total) by mouth  daily. For mood stabilization 30 tablet 0   medroxyPROGESTERone (DEPO-PROVERA) 150 MG/ML injection Inject 1 mL (150 mg total) into the muscle every 3 (three) months. For prevention of pregnancy 1 mL    OLANZapine zydis (ZYPREXA) 15 MG disintegrating tablet Take 1 tablet (15 mg total) by mouth at bedtime. For mood control 30 tablet 0   polyethylene glycol powder (GLYCOLAX/MIRALAX) powder Take 1 Container by mouth once.     sodium chloride (OCEAN) 0.65 % SOLN nasal spray Place 1 spray into both nostrils as needed for congestion.  0   Vitamin D, Ergocalciferol, (DRISDOL) 1.25 MG (50000 UT) CAPS capsule Take 1 capsule (50,000 Units total) by mouth every 7 (seven) days. 4 capsule 0   No current facility-administered medications on file prior to visit.     PAST MEDICAL HISTORY: Past Medical History:  Diagnosis Date   Bipolar 1 disorder ([place])    Bowel incontinence    Depression    Mental disability     PAST SURGICAL HISTORY: Past Surgical History:  Procedure Laterality Date   FOOT SURGERY      SOCIAL HISTORY: Social History   Tobacco Use   Smoking status: Never Smoker   Smokeless tobacco: Never Used  Substance Use Topics   Alcohol use: No   Drug use: No    FAMILY HISTORY: Family History  Problem Relation Age of Onset   High blood pressure Mother    Anxiety disorder Mother    Diabetes Maternal Grandmother     ROS: Review of Systems  Endo/Heme/Allergies:       Positive for polyphagia.    PHYSICAL EXAM: Pt in no acute distress  RECENT LABS AND TESTS: BMET    Component Value Date/Time   NA 140 04/23/2018 1234   K 4.0 04/23/2018 1234   CL 103 04/23/2018 1234   CO2 19 (L) 04/23/2018 1234   GLUCOSE 87 04/23/2018 1234   GLUCOSE 99 09/09/2014 1846   BUN 6 04/23/2018 1234   CREATININE 0.9 09/08/2018   CREATININE 0.81 04/23/2018 1234   CALCIUM 9.5 04/23/2018 1234   GFRNONAA 99 04/23/2018 1234   GFRAA 114 04/23/2018 1234   Lab Results  Component  Value Date   HGBA1C 5.6 09/08/2018   HGBA1C 5.3 04/23/2018   HGBA1C 5.4 11/26/2017   HGBA1C 5.4 09/11/2014   Lab Results  Component Value Date   INSULIN 31.2 (H) 04/23/2018   INSULIN 44.0 (H) 11/26/2017   CBC    Component Value Date/Time   WBC 9.5 09/08/2018   WBC 6.9 09/09/2014 1846   RBC 4.42 11/26/2017 0859   RBC 3.85 (L) 09/09/2014 1846   HGB 12.8 09/08/2018   HGB 13.0 11/26/2017 0859   HCT 18 (A) 09/08/2018   HCT 38.8 11/26/2017 0859   PLT 233 09/08/2018   MCV 88 11/26/2017 0859   MCH 29.4 11/26/2017 0859   MCH 30.1 09/09/2014 1846   MCHC 33.5 11/26/2017 0859   MCHC 34.0 09/09/2014 1846   RDW 13.7 11/26/2017 0859   LYMPHSABS 2.1 11/26/2017 0859   MONOABS 0.5 09/09/2014 1846   EOSABS 0.0 11/26/2017 0859   BASOSABS 0.0 11/26/2017 0859  Iron/TIBC/Ferritin/ %Sat No results found for: IRON, TIBC, FERRITIN, IRONPCTSAT Lipid Panel     Component Value Date/Time   CHOL 172 09/08/2018   CHOL 164 11/26/2017 0859   TRIG 131 09/08/2018   HDL 59 09/08/2018   HDL 44 11/26/2017 0859   CHOLHDL 4.1 09/11/2014 0630   VLDL 13 09/11/2014 0630   LDLCALC 90 09/08/2018   LDLCALC 97 11/26/2017 0859   Hepatic Function Panel     Component Value Date/Time   PROT 7.4 04/23/2018 1234   ALBUMIN 4.6 04/23/2018 1234   AST 15 09/08/2018   ALT 25 09/08/2018   ALKPHOS 91 09/08/2018   BILITOT 0.3 04/23/2018 1234      Component Value Date/Time   TSH 2.800 11/26/2017 0859   TSH 3.374 09/11/2014 0630   Results for YU, CRAGUN (MRN [id number]) as of 02/02/2019 15:23  Ref. Range 04/23/2018 12:34  Vitamin D, 25-Hydroxy Latest Ref Range: 30.0 - 100.0 ng/mL 41.9     I, Marcille Blanco, CMA, am acting as transcriptionist for Starlyn Skeans, MD I have reviewed the above documentation for accuracy and completeness, and I agree with the above. -Dennard Nip, MD

## 2019-02-03 DIAGNOSIS — R7303 Prediabetes: Secondary | ICD-10-CM | POA: Insufficient documentation

## 2019-02-11 ENCOUNTER — Telehealth (INDEPENDENT_AMBULATORY_CARE_PROVIDER_SITE_OTHER): Payer: Medicare Other | Admitting: Family Medicine

## 2019-02-11 ENCOUNTER — Other Ambulatory Visit: Payer: Self-pay

## 2019-02-11 ENCOUNTER — Encounter (INDEPENDENT_AMBULATORY_CARE_PROVIDER_SITE_OTHER): Payer: Self-pay | Admitting: Family Medicine

## 2019-02-11 DIAGNOSIS — E559 Vitamin D deficiency, unspecified: Secondary | ICD-10-CM

## 2019-02-11 DIAGNOSIS — Z6841 Body Mass Index (BMI) 40.0 and over, adult: Secondary | ICD-10-CM

## 2019-02-12 NOTE — Progress Notes (Signed)
Office: [phone]  /  Fax: [phone] TeleHealth Visit:  Cindy Phillips has verbally consented to this TeleHealth visit today. The patient is located at home, the provider is located at the [facility] office. The participants in this visit include the listed provider and patient. Cindy Phillips was unable to use realtime audiovisual technology today and the telehealth visit was conducted via telephone.   HPI:   Chief Complaint: OBESITY Cindy Phillips is here to discuss her progress with her obesity treatment plan. She is on the Category 3 plan and is following her eating plan approximately 20 % of the time. She states she is exercising 0 minutes 0 times per week. [name] is struggling to stay on track with her eating. She has a difficult time following any structured plan, as she lives with family and doesn't do her own grocery shopping. Her motivation to improve her health seems to have decreased.  We were unable to weigh the patient today for this TeleHealth visit. She feels as if she has gained 1-2 lbs since her last visit. She has lost 0 lbs since starting treatment with [place].  Vitamin D Deficiency Cindy Phillips has a diagnosis of vitamin D deficiency. She is stable on prescription Vit D and denies nausea, vomiting or muscle weakness.  ASSESSMENT AND PLAN:  Vitamin D deficiency  Class 3 severe obesity with serious comorbidity and body mass index (BMI) of 40.0 to 44.9 in adult, unspecified obesity type ([place])  PLAN:  Vitamin D Deficiency [name] was informed that low vitamin D levels contributes to fatigue and are associated with obesity, breast, and colon cancer. Cindy Phillips agrees to continue taking prescription Vit D 50,000 IU every week #4 and we will refill for 1 month. She will follow up for routine testing of vitamin D, at least 2-3 times per year. She was informed of the risk of over-replacement of vitamin D and agrees to not increase her dose unless she discusses this with [place]  first. We will recheck labs at next in office visit. Cindy Phillips agrees to follow up with our clinic in 3 weeks.  I spent > than 50% of the 15 minute visit on counseling as documented in the note.  Obesity Cindy Phillips is not currently in the action stage of change. As such, her goal is to maintain weight for now, and follow in 3 weeks to reassess readiness to change She has agreed to follow the Category 1 plan, sent via [place] has been instructed to work up to a goal of 150 minutes of combined cardio and strengthening exercise per week for weight loss and overall health benefits. We discussed the following Behavioral Modification Strategies today: increasing lean protein intake, increasing vegetables and work on meal planning and easy cooking plans   Cindy Phillips has agreed to follow up with our clinic in 3 weeks. She was informed of the importance of frequent follow up visits to maximize her success with intensive lifestyle modifications for her multiple health conditions.  ALLERGIES: No Known Allergies  MEDICATIONS: Current Outpatient Medications on File Prior to Visit  Medication Sig Dispense Refill  . atorvastatin (LIPITOR) 10 MG tablet Take 10 mg by mouth daily.    . hydrOXYzine (ATARAX/VISTARIL) 50 MG tablet Take 1 tablet (50 mg total) by mouth every 4 (four) hours as needed for anxiety. 45 tablet 0  . lamoTRIgine (LAMICTAL) 25 MG tablet Take 1 tablet (25 mg total) by mouth daily. For mood stabilization 30 tablet 0  . medroxyPROGESTERone (DEPO-PROVERA) 150 MG/ML injection  Inject 1 mL (150 mg total) into the muscle every 3 (three) months. For prevention of pregnancy 1 mL   . metFORMIN (GLUCOPHAGE) 500 MG tablet Take two tabs p o Qam and One in the  PM 90 tablet 0  . OLANZapine zydis (ZYPREXA) 15 MG disintegrating tablet Take 1 tablet (15 mg total) by mouth at bedtime. For mood control 30 tablet 0  . polyethylene glycol powder (GLYCOLAX/MIRALAX) powder Take 1 Container by mouth once.    .  sodium chloride (OCEAN) 0.65 % SOLN nasal spray Place 1 spray into both nostrils as needed for congestion.  0  . Vitamin D, Ergocalciferol, (DRISDOL) 1.25 MG (50000 UT) CAPS capsule Take 1 capsule (50,000 Units total) by mouth every 7 (seven) days. 4 capsule 0   No current facility-administered medications on file prior to visit.     PAST MEDICAL HISTORY: Past Medical History:  Diagnosis Date  . Bipolar 1 disorder ([place])   . Bowel incontinence   . Depression   . Mental disability     PAST SURGICAL HISTORY: Past Surgical History:  Procedure Laterality Date  . FOOT SURGERY      SOCIAL HISTORY: Social History   Tobacco Use  . Smoking status: Never Smoker  . Smokeless tobacco: Never Used  Substance Use Topics  . Alcohol use: No  . Drug use: No    FAMILY HISTORY: Family History  Problem Relation Age of Onset  . High blood pressure Mother   . Anxiety disorder Mother   . Diabetes Maternal Grandmother     ROS: Review of Systems  Constitutional: Negative for weight loss.  Gastrointestinal: Negative for nausea and vomiting.  Musculoskeletal:       Negative muscle weakness    PHYSICAL EXAM: Pt in no acute distress  RECENT LABS AND TESTS: BMET    Component Value Date/Time   NA 140 04/23/2018 1234   K 4.0 04/23/2018 1234   CL 103 04/23/2018 1234   CO2 19 (L) 04/23/2018 1234   GLUCOSE 87 04/23/2018 1234   GLUCOSE 99 09/09/2014 1846   BUN 6 04/23/2018 1234   CREATININE 0.9 09/08/2018   CREATININE 0.81 04/23/2018 1234   CALCIUM 9.5 04/23/2018 1234   GFRNONAA 99 04/23/2018 1234   GFRAA 114 04/23/2018 1234   Lab Results  Component Value Date   HGBA1C 5.6 09/08/2018   HGBA1C 5.3 04/23/2018   HGBA1C 5.4 11/26/2017   HGBA1C 5.4 09/11/2014   Lab Results  Component Value Date   INSULIN 31.2 (H) 04/23/2018   INSULIN 44.0 (H) 11/26/2017   CBC    Component Value Date/Time   WBC 9.5 09/08/2018   WBC 6.9 09/09/2014 1846   RBC 4.42 11/26/2017 0859   RBC 3.85  (L) 09/09/2014 1846   HGB 12.8 09/08/2018   HGB 13.0 11/26/2017 0859   HCT 18 (A) 09/08/2018   HCT 38.8 11/26/2017 0859   PLT 233 09/08/2018   MCV 88 11/26/2017 0859   MCH 29.4 11/26/2017 0859   MCH 30.1 09/09/2014 1846   MCHC 33.5 11/26/2017 0859   MCHC 34.0 09/09/2014 1846   RDW 13.7 11/26/2017 0859   LYMPHSABS 2.1 11/26/2017 0859   MONOABS 0.5 09/09/2014 1846   EOSABS 0.0 11/26/2017 0859   BASOSABS 0.0 11/26/2017 0859   Iron/TIBC/Ferritin/ %Sat No results found for: IRON, TIBC, FERRITIN, IRONPCTSAT Lipid Panel     Component Value Date/Time   CHOL 172 09/08/2018   CHOL 164 11/26/2017 0859   TRIG 131 09/08/2018   HDL  59 09/08/2018   HDL 44 11/26/2017 0859   CHOLHDL 4.1 09/11/2014 0630   VLDL 13 09/11/2014 0630   LDLCALC 90 09/08/2018   LDLCALC 97 11/26/2017 0859   Hepatic Function Panel     Component Value Date/Time   PROT 7.4 04/23/2018 1234   ALBUMIN 4.6 04/23/2018 1234   AST 15 09/08/2018   ALT 25 09/08/2018   ALKPHOS 91 09/08/2018   BILITOT 0.3 04/23/2018 1234      Component Value Date/Time   TSH 2.800 11/26/2017 0859   TSH 3.374 09/11/2014 0630      I, Trixie Dredge, am acting as transcriptionist for Dennard Nip, MD I have reviewed the above documentation for accuracy and completeness, and I agree with the above. -Dennard Nip, MD

## 2019-02-16 MED ORDER — VITAMIN D (ERGOCALCIFEROL) 1.25 MG (50000 UNIT) PO CAPS: 50000.0000 [IU] | ORAL_CAPSULE | ORAL | 0 refills | Status: DC

## 2019-02-22 ENCOUNTER — Other Ambulatory Visit (INDEPENDENT_AMBULATORY_CARE_PROVIDER_SITE_OTHER): Payer: Self-pay | Admitting: Family Medicine

## 2019-02-22 DIAGNOSIS — E119 Type 2 diabetes mellitus without complications: Secondary | ICD-10-CM

## 2019-03-01 DIAGNOSIS — G4733 Obstructive sleep apnea (adult) (pediatric): Secondary | ICD-10-CM | POA: Diagnosis not present

## 2019-03-02 ENCOUNTER — Ambulatory Visit (INDEPENDENT_AMBULATORY_CARE_PROVIDER_SITE_OTHER): Payer: Medicare Other | Admitting: Family Medicine

## 2019-03-02 ENCOUNTER — Encounter (INDEPENDENT_AMBULATORY_CARE_PROVIDER_SITE_OTHER): Payer: Self-pay | Admitting: Family Medicine

## 2019-03-02 ENCOUNTER — Other Ambulatory Visit: Payer: Self-pay

## 2019-03-02 VITALS — BP 117/79 | HR 93 | Temp 98.0°F | Ht 71.0 in | Wt 313.0 lb

## 2019-03-02 DIAGNOSIS — E559 Vitamin D deficiency, unspecified: Secondary | ICD-10-CM

## 2019-03-02 DIAGNOSIS — Z6841 Body Mass Index (BMI) 40.0 and over, adult: Secondary | ICD-10-CM

## 2019-03-02 DIAGNOSIS — E119 Type 2 diabetes mellitus without complications: Secondary | ICD-10-CM | POA: Diagnosis not present

## 2019-03-02 DIAGNOSIS — R7303 Prediabetes: Secondary | ICD-10-CM | POA: Diagnosis not present

## 2019-03-02 MED ORDER — METFORMIN HCL 500 MG PO TABS: ORAL_TABLET | ORAL | 0 refills | Status: DC

## 2019-03-02 MED ORDER — VITAMIN D (ERGOCALCIFEROL) 1.25 MG (50000 UNIT) PO CAPS: 50000.0000 [IU] | ORAL_CAPSULE | ORAL | 0 refills | Status: DC

## 2019-03-02 NOTE — Progress Notes (Signed)
Office: [phone]  /  Fax: [phone]   HPI:   Chief Complaint: OBESITY Cindy Phillips is here to discuss her progress with her obesity treatment plan. She is on the Category 1 plan and is following her eating plan approximately 60 % of the time. She states she is walking for 10-20 minutes 7 times per week. Cindy Phillips's last in office visit was 4 months ago, and she has gained 7 lbs. She is not at a point where she can concentrate on weight loss, but she wants to stop weight gain.  Her weight is (!) 313 lb (142 kg) today and has gained 7 lbs since her last visit. She has lost 0 lbs since starting treatment with [place].  Diabetes II Cindy Phillips has a diagnosis of diabetes type II. Cindy Phillips is stable on metformin and denies nausea, vomiting, hypoglycemia. She is due for labs. Last A1c was 5.6. She has been working on intensive lifestyle modifications including diet, exercise, and weight loss to help control her blood glucose levels.  Vitamin D Deficiency Cindy Phillips has a diagnosis of vitamin D deficiency. She has not been taking Vit D religiously, but level is not yet at goal. She denies nausea, vomiting or muscle weakness, but notes fatigue.  ASSESSMENT AND PLAN:  Prediabetes - Plan: Hemoglobin A1c, Insulin, random, Comprehensive metabolic panel  Vitamin D deficiency - Plan: VITAMIN D 25 Hydroxy (Vit-D Deficiency, Fractures), Vitamin D, Ergocalciferol, (DRISDOL) 1.25 MG (50000 UT) CAPS capsule  Type 2 diabetes mellitus without complication, without long-term current use of insulin (HCC) - Plan: metFORMIN (GLUCOPHAGE) 500 MG tablet  Class 3 severe obesity with serious comorbidity and body mass index (BMI) of 40.0 to 44.9 in adult, unspecified obesity type ([place])  PLAN:  Diabetes II Cindy Phillips has been given extensive diabetes education by myself today including ideal fasting and post-prandial blood glucose readings, individual ideal Hgb A1c goals and hypoglycemia prevention. We discussed the importance of  good blood sugar control to decrease the likelihood of diabetic complications such as nephropathy, neuropathy, limb loss, blindness, coronary artery disease, and death. We discussed the importance of intensive lifestyle modification including diet, exercise and weight loss as the first line treatment for diabetes. Cindy Phillips agrees to continue taking metformin 500 mg PO q AM #30 and we will refill for 1 month. We will check labs today. Cindy Phillips agrees to follow up with our clinic in 4 weeks.  Vitamin D Deficiency Cindy Phillips was informed that low vitamin D levels contributes to fatigue and are associated with obesity, breast, and colon cancer. Cindy Phillips agrees to continue taking prescription Vit D 50,000 IU every week #4 and we will refill for 1 month. She will follow up for routine testing of vitamin D, at least 2-3 times per year. She was informed of the risk of over-replacement of vitamin D and agrees to not increase her dose unless she discusses this with [place] first. Cindy Phillips agrees to follow up with our clinic in 4 weeks.  Obesity Cindy Phillips is currently in the action stage of change. As such, her goal is to maintain weight for now She has agreed to follow the Category 1 plan Cindy Phillips has been instructed to work up to a goal of 150 minutes of combined cardio and strengthening exercise per week for weight loss and overall health benefits. We discussed the following Behavioral Modification Strategies today: increasing lean protein intake, work on meal planning and easy cooking plans, ways to avoid boredom eating, ways to avoid night time snacking, and better snacking choices  Cindy Phillips has agreed to follow up with our clinic in 4 weeks. She was informed of the importance of frequent follow up visits to maximize her success with intensive lifestyle modifications for her multiple health conditions.  ALLERGIES: No Known Allergies  MEDICATIONS: Current Outpatient Medications on File Prior to Visit  Medication Sig  Dispense Refill  . atorvastatin (LIPITOR) 10 MG tablet Take 10 mg by mouth daily.    . hydrOXYzine (ATARAX/VISTARIL) 50 MG tablet Take 1 tablet (50 mg total) by mouth every 4 (four) hours as needed for anxiety. 45 tablet 0  . lamoTRIgine (LAMICTAL) 25 MG tablet Take 1 tablet (25 mg total) by mouth daily. For mood stabilization 30 tablet 0  . medroxyPROGESTERone (DEPO-PROVERA) 150 MG/ML injection Inject 1 mL (150 mg total) into the muscle every 3 (three) months. For prevention of pregnancy 1 mL   . OLANZapine zydis (ZYPREXA) 15 MG disintegrating tablet Take 1 tablet (15 mg total) by mouth at bedtime. For mood control 30 tablet 0  . polyethylene glycol powder (GLYCOLAX/MIRALAX) powder Take 1 Container by mouth once.    . sodium chloride (OCEAN) 0.65 % SOLN nasal spray Place 1 spray into both nostrils as needed for congestion.  0   No current facility-administered medications on file prior to visit.     PAST MEDICAL HISTORY: Past Medical History:  Diagnosis Date  . Bipolar 1 disorder ([place])   . Bowel incontinence   . Depression   . Mental disability     PAST SURGICAL HISTORY: Past Surgical History:  Procedure Laterality Date  . FOOT SURGERY      SOCIAL HISTORY: Social History   Tobacco Use  . Smoking status: Never Smoker  . Smokeless tobacco: Never Used  Substance Use Topics  . Alcohol use: No  . Drug use: No    FAMILY HISTORY: Family History  Problem Relation Age of Onset  . High blood pressure Mother   . Anxiety disorder Mother   . Diabetes Maternal Grandmother     ROS: Review of Systems  Constitutional: Positive for malaise/fatigue. Negative for weight loss.  Gastrointestinal: Negative for nausea and vomiting.  Musculoskeletal:       Negative muscle weakness  Endo/Heme/Allergies:       Negative hypoglycemia    PHYSICAL EXAM: Blood pressure 117/79, pulse 93, temperature 98 F (36.7 C), temperature source Oral, height 5\' 11"  (1.803 m), weight (!) 313 lb (142  kg), SpO2 97 %. Body mass index is 43.65 kg/m. Physical Exam Vitals signs reviewed.  Constitutional:      Appearance: Normal appearance. She is obese.  Cardiovascular:     Rate and Rhythm: Normal rate.     Pulses: Normal pulses.  Pulmonary:     Effort: Pulmonary effort is normal.     Breath sounds: Normal breath sounds.  Musculoskeletal: Normal range of motion.  Skin:    General: Skin is warm and dry.  Neurological:     Mental Status: She is alert and oriented to person, place, and time.  Psychiatric:        Mood and Affect: Mood normal.        Behavior: Behavior normal.     RECENT LABS AND TESTS: BMET    Component Value Date/Time   NA 140 04/23/2018 1234   K 4.0 04/23/2018 1234   CL 103 04/23/2018 1234   CO2 19 (L) 04/23/2018 1234   GLUCOSE 87 04/23/2018 1234   GLUCOSE 99 09/09/2014 1846   BUN 6 04/23/2018 1234  CREATININE 0.9 09/08/2018   CREATININE 0.81 04/23/2018 1234   CALCIUM 9.5 04/23/2018 1234   GFRNONAA 99 04/23/2018 1234   GFRAA 114 04/23/2018 1234   Lab Results  Component Value Date   HGBA1C 5.6 09/08/2018   HGBA1C 5.3 04/23/2018   HGBA1C 5.4 11/26/2017   HGBA1C 5.4 09/11/2014   Lab Results  Component Value Date   INSULIN 31.2 (H) 04/23/2018   INSULIN 44.0 (H) 11/26/2017   CBC    Component Value Date/Time   WBC 9.5 09/08/2018   WBC 6.9 09/09/2014 1846   RBC 4.42 11/26/2017 0859   RBC 3.85 (L) 09/09/2014 1846   HGB 12.8 09/08/2018   HGB 13.0 11/26/2017 0859   HCT 18 (A) 09/08/2018   HCT 38.8 11/26/2017 0859   PLT 233 09/08/2018   MCV 88 11/26/2017 0859   MCH 29.4 11/26/2017 0859   MCH 30.1 09/09/2014 1846   MCHC 33.5 11/26/2017 0859   MCHC 34.0 09/09/2014 1846   RDW 13.7 11/26/2017 0859   LYMPHSABS 2.1 11/26/2017 0859   MONOABS 0.5 09/09/2014 1846   EOSABS 0.0 11/26/2017 0859   BASOSABS 0.0 11/26/2017 0859   Iron/TIBC/Ferritin/ %Sat No results found for: IRON, TIBC, FERRITIN, IRONPCTSAT Lipid Panel     Component Value  Date/Time   CHOL 172 09/08/2018   CHOL 164 11/26/2017 0859   TRIG 131 09/08/2018   HDL 59 09/08/2018   HDL 44 11/26/2017 0859   CHOLHDL 4.1 09/11/2014 0630   VLDL 13 09/11/2014 0630   LDLCALC 90 09/08/2018   LDLCALC 97 11/26/2017 0859   Hepatic Function Panel     Component Value Date/Time   PROT 7.4 04/23/2018 1234   ALBUMIN 4.6 04/23/2018 1234   AST 15 09/08/2018   ALT 25 09/08/2018   ALKPHOS 91 09/08/2018   BILITOT 0.3 04/23/2018 1234      Component Value Date/Time   TSH 2.800 11/26/2017 0859   TSH 3.374 09/11/2014 0630      OBESITY BEHAVIORAL INTERVENTION VISIT  Today's visit was # 20   Starting weight: 289 lbs Starting date: 11/26/17 Today's weight : 313 lbs Today's date: 03/02/2019 Total lbs lost to date: 0 At least 15 minutes were spent on discussing the following behavioral intervention visit.   ASK: We discussed the diagnosis of obesity with Cindy Phillips today and Cindy Phillips agreed to give [place] permission to discuss obesity behavioral modification therapy today.  ASSESS: Cindy Phillips has the diagnosis of obesity and her BMI today is 43.67 [place] is in the action stage of change   ADVISE: Cindy Phillips was educated on the multiple health risks of obesity as well as the benefit of weight loss to improve her health. She was advised of the need for long term treatment and the importance of lifestyle modifications to improve her current health and to decrease her risk of future health problems.  AGREE: Multiple dietary modification options and treatment options were discussed and  Cindy Phillips agreed to follow the recommendations documented in the above note.  ARRANGE: Cindy Phillips was educated on the importance of frequent visits to treat obesity as outlined per CMS and USPSTF guidelines and agreed to schedule her next follow up appointment today.  I, Trixie Dredge, am acting as transcriptionist for Dennard Nip, MD  I have reviewed the above documentation for accuracy and  completeness, and I agree with the above. -Dennard Nip, MD

## 2019-03-03 LAB — COMPREHENSIVE METABOLIC PANEL
ALT: 13 IU/L (ref 0–32)
AST: 16 IU/L (ref 0–40)
Albumin/Globulin Ratio: 1.7 (ref 1.2–2.2)
Albumin: 4.6 g/dL (ref 3.9–5.0)
Alkaline Phosphatase: 103 IU/L (ref 39–117)
BUN/Creatinine Ratio: 10 (ref 9–23)
BUN: 8 mg/dL (ref 6–20)
Bilirubin Total: 0.4 mg/dL (ref 0.0–1.2)
CO2: 19 mmol/L — ABNORMAL LOW (ref 20–29)
Calcium: 10.1 mg/dL (ref 8.7–10.2)
Chloride: 105 mmol/L (ref 96–106)
Creatinine, Ser: 0.78 mg/dL (ref 0.57–1.00)
GFR calc Af Amer: 119 mL/min/{1.73_m2} (ref >59)
GFR calc non Af Amer: 103 mL/min/{1.73_m2} (ref >59)
Globulin, Total: 2.7 g/dL (ref 1.5–4.5)
Glucose: 91 mg/dL (ref 65–99)
Potassium: 4.2 mmol/L (ref 3.5–5.2)
Sodium: 140 mmol/L (ref 134–144)
Total Protein: 7.3 g/dL (ref 6.0–8.5)

## 2019-03-03 LAB — VITAMIN D 25 HYDROXY (VIT D DEFICIENCY, FRACTURES): Vit D, 25-Hydroxy: 40 ng/mL (ref 30.0–100.0)

## 2019-03-03 LAB — INSULIN, RANDOM: INSULIN: 48.3 u[IU]/mL — ABNORMAL HIGH (ref 2.6–24.9)

## 2019-03-03 LAB — HEMOGLOBIN A1C
Est. average glucose Bld gHb Est-mCnc: 120 mg/dL
Hgb A1c MFr Bld: 5.8 % — ABNORMAL HIGH (ref 4.8–5.6)

## 2019-03-26 ENCOUNTER — Encounter: Payer: Self-pay | Admitting: Orthopaedic Surgery

## 2019-03-26 ENCOUNTER — Ambulatory Visit: Payer: Self-pay

## 2019-03-26 ENCOUNTER — Ambulatory Visit (INDEPENDENT_AMBULATORY_CARE_PROVIDER_SITE_OTHER): Payer: Medicare Other | Admitting: Orthopaedic Surgery

## 2019-03-26 DIAGNOSIS — M25561 Pain in right knee: Secondary | ICD-10-CM

## 2019-03-26 DIAGNOSIS — G8929 Other chronic pain: Secondary | ICD-10-CM | POA: Diagnosis not present

## 2019-03-26 MED ORDER — BUPIVACAINE HCL 0.25 % IJ SOLN
2.0000 mL | INTRAMUSCULAR | Status: AC | PRN
  Administered 2019-03-26: 09:00:00 2 mL via INTRA_ARTICULAR

## 2019-03-26 MED ORDER — LIDOCAINE HCL 1 % IJ SOLN
2.0000 mL | INTRAMUSCULAR | Status: AC | PRN
  Administered 2019-03-26: 2 mL

## 2019-03-26 MED ORDER — METHYLPREDNISOLONE ACETATE 40 MG/ML IJ SUSP
40.0000 mg | INTRAMUSCULAR | Status: AC | PRN
  Administered 2019-03-26: 09:00:00 40 mg via INTRA_ARTICULAR

## 2019-03-26 NOTE — Progress Notes (Signed)
Office Visit Note   Patient: Cindy Phillips           Date of Birth: 1991/01/19           MRN: [id number] Visit Date: 03/26/2019              Requested by: Lucianne Lei, MD [address] [place],  [place] [address] PCP: Lucianne Lei, MD   Assessment & Plan: Visit Diagnoses:  1. Chronic pain of right knee     Plan: Impression is right knee pain.  We will inject the right knee with cortisone today.  She will call us in 2 weeks if she is not any better at that point we may consider MRI.  She will follow-up with [place] as needed.  Follow-Up Instructions: Return if symptoms worsen or fail to improve.   Orders:  Orders Placed This Encounter  Procedures  . Large Joint Inj: R knee  . XR KNEE 3 VIEW RIGHT   No orders of the defined types were placed in this encounter.     Procedures: Large Joint Inj: R knee on 03/26/2019 8:49 AM Indications: pain Details: 22 G needle, anterolateral approach Medications: 2 mL bupivacaine 0.25 %; 2 mL lidocaine 1 %; 40 mg methylPREDNISolone acetate 40 MG/ML      Clinical Data: No additional findings.   Subjective: Chief Complaint  Patient presents with  . Right Knee - Pain    HPI patient is a pleasant 28 year old female who presents our clinic today with right knee pain.  This has been ongoing for the past 1 to 2 months.  No known injury or change in activity.  The pain she has is to the anteromedial aspect.  She describes this as a constant throb worse when she is going from a seated to standing position or when she sits for too long period of time.  She notes occasional locking.  No instability.  She has been using icy hot and over-the-counter anti-inflammatories without relief of symptoms.  No radicular symptoms noted.  No anterior thigh or groin pain.  She does note a remote history of right knee cortisone injection approximately 2 to 3 years ago with mild relief of symptoms.  No surgical intervention to the right knee.  Review of  Systems as detailed in HPI.  All others reviewed and are negative.   Objective: Vital Signs: There were no vitals taken for this visit.  Physical Exam well-developed well-nourished female no acute distress.  Alert and oriented x3.  Ortho Exam examination of the right knee shows a trace effusion.  Range of motion 0 to 120 degrees.  Medial and lateral joint line tenderness.  Mild patellofemoral crepitus.  Ligaments are stable.  Negative logroll and negative straight leg raise.  She is neurovascular intact distally.  Specialty Comments:  No specialty comments available.  Imaging: Xr Knee 3 View Right  Result Date: 03/26/2019 No acute or structural abnormalities    PMFS History: Patient Active Problem List   Diagnosis Date Noted  . Prediabetes 02/03/2019  . Obstructive sleep apnea 04/23/2018  . Other fatigue 11/26/2017  . Shortness of breath on exertion 11/26/2017  . Type 2 diabetes mellitus without complication, without long-term current use of insulin ([place]) 11/26/2017  . Other hyperlipidemia 11/26/2017  . Vitamin D deficiency 11/26/2017  . Schizoaffective disorder, bipolar type ([place]) 09/14/2014  . Intellectual disability 09/10/2014   Past Medical History:  Diagnosis Date  . Bipolar 1 disorder ([place])   . Bowel  incontinence   . Depression   . Mental disability     Family History  Problem Relation Age of Onset  . High blood pressure Mother   . Anxiety disorder Mother   . Diabetes Maternal Grandmother     Past Surgical History:  Procedure Laterality Date  . FOOT SURGERY     Social History   Occupational History  . Occupation: Clinical research associate     Comment: [employer]   Tobacco Use  . Smoking status: Never Smoker  . Smokeless tobacco: Never Used  Substance and Sexual Activity  . Alcohol use: No  . Drug use: No  . Sexual activity: Not Currently

## 2019-03-30 ENCOUNTER — Ambulatory Visit (INDEPENDENT_AMBULATORY_CARE_PROVIDER_SITE_OTHER): Payer: Medicare Other | Admitting: Family Medicine

## 2019-03-30 ENCOUNTER — Encounter (INDEPENDENT_AMBULATORY_CARE_PROVIDER_SITE_OTHER): Payer: Self-pay | Admitting: Family Medicine

## 2019-03-30 ENCOUNTER — Other Ambulatory Visit: Payer: Self-pay

## 2019-03-30 VITALS — BP 126/74 | HR 88 | Temp 97.9°F | Ht 71.0 in | Wt 311.0 lb

## 2019-03-30 DIAGNOSIS — Z6841 Body Mass Index (BMI) 40.0 and over, adult: Secondary | ICD-10-CM | POA: Diagnosis not present

## 2019-03-30 DIAGNOSIS — E119 Type 2 diabetes mellitus without complications: Secondary | ICD-10-CM

## 2019-03-30 DIAGNOSIS — Z9189 Other specified personal risk factors, not elsewhere classified: Secondary | ICD-10-CM | POA: Diagnosis not present

## 2019-03-30 DIAGNOSIS — E559 Vitamin D deficiency, unspecified: Secondary | ICD-10-CM | POA: Diagnosis not present

## 2019-03-30 MED ORDER — METFORMIN HCL 500 MG PO TABS: ORAL_TABLET | ORAL | 0 refills | Status: DC

## 2019-03-30 MED ORDER — VITAMIN D (ERGOCALCIFEROL) 1.25 MG (50000 UNIT) PO CAPS: 50000.0000 [IU] | ORAL_CAPSULE | ORAL | 0 refills | Status: DC

## 2019-04-01 DIAGNOSIS — G4733 Obstructive sleep apnea (adult) (pediatric): Secondary | ICD-10-CM | POA: Diagnosis not present

## 2019-04-01 NOTE — Progress Notes (Signed)
Office: [phone]  /  Fax: [phone]   HPI:   Chief Complaint: OBESITY Cindy Phillips is here to discuss her progress with her obesity treatment plan. She is on the Category 1 plan and is following her eating plan approximately 0% of the time. She states she is exercising 0 minutes 0 times per week. Cindy Phillips has done well with a 2 lb weight loss, but she is not following a structured eating plan - is mostly trying to PC/[place]. She is still eating more simple carbs and less vegetables.  Her weight is (!) 311 lb (141.1 kg) today and has had a weight loss of 2 pounds over a period of 4 weeks since her last visit. She has lost 0 lbs since starting treatment with [place].  Pre-Diabetes Cindy Phillips has a diagnosis of prediabetes based on her elevated Hgb A1c and was informed this puts her at greater risk of developing diabetes. Her A1c continues to rise as she is not following her plan closely. She also is skipping her 2nd dose of metformin mostly. She is taking metformin currently and continues to work on diet and exercise to decrease risk of diabetes.  At risk for diabetes Cindy Phillips is at higher than average risk for developing diabetes due to her obesity. She currently denies polyuria or polydipsia.  Vitamin D deficiency Cindy Phillips has a diagnosis of Vitamin D deficiency, which is not yet at goal. She is currently taking prescription Vit D and denies nausea, vomiting or muscle weakness.  ASSESSMENT AND PLAN:  Type 2 diabetes mellitus without complication, without long-term current use of insulin (HCC) - Plan: metFORMIN (GLUCOPHAGE) 500 MG tablet  Vitamin D deficiency - Plan: Vitamin D, Ergocalciferol, (DRISDOL) 1.25 MG (50000 UT) CAPS capsule  At risk for diabetes mellitus  Class 3 severe obesity with serious comorbidity and body mass index (BMI) of 40.0 to 44.9 in adult, unspecified obesity type [facility])  PLAN:  Pre-Diabetes Cindy Phillips will continue to work on weight loss, exercise, and decreasing simple  carbohydrates in her diet to help decrease the risk of diabetes. We dicussed metformin including benefits and risks. She was informed that eating too many simple carbohydrates or too many calories at one sitting increases the likelihood of GI side effects. Cindy Phillips was given a refill on her metformin 500 mg BID #60 and agrees to follow-up with our clinic in 2 weeks.  Diabetes risk counseling Cindy Phillips was given extended (15 minutes) diabetes prevention counseling today. She is 28 y.o. female and has risk factors for diabetes including obesity. We discussed intensive lifestyle modifications today with an emphasis on weight loss as well as increasing exercise and decreasing simple carbohydrates in her diet.  Vitamin D Deficiency Cindy Phillips was informed that low Vitamin D levels contributes to fatigue and are associated with obesity, breast, and colon cancer. She agrees to continue to take prescription Vit D @ 50,000 IU every week #4 with 0 refills and will follow-up for routine testing of Vitamin D, at least 2-3 times per year. She was informed of the risk of over-replacement of Vitamin D and agrees to not increase her dose unless she discusses this with [place] first. Cindy Phillips agrees to follow-up with our clinic in 2 weeks.  Obesity Cindy Phillips is currently in the action stage of change. As such, her goal is to continue with weight loss efforts. She has agreed to portion control better and make smarter food choices, such as increase vegetables and decrease simple carbohydrates.  Cindy Phillips has been instructed to work up to a  goal of 150 minutes of combined cardio and strengthening exercise per week for weight loss and overall health benefits. We discussed the following Behavioral Modification Strategies today: increasing lean protein intake, decreasing simple carbohydrates, increasing vegetables, work on meal planning and easy cooking plans.  Cindy Phillips has agreed to follow-up with our clinic in 2 weeks. She was informed of  the importance of frequent follow-up visits to maximize her success with intensive lifestyle modifications for her multiple health conditions.  ALLERGIES: No Known Allergies  MEDICATIONS: Current Outpatient Medications on File Prior to Visit  Medication Sig Dispense Refill  . atorvastatin (LIPITOR) 10 MG tablet Take 10 mg by mouth daily.    . hydrOXYzine (ATARAX/VISTARIL) 50 MG tablet Take 1 tablet (50 mg total) by mouth every 4 (four) hours as needed for anxiety. 45 tablet 0  . lamoTRIgine (LAMICTAL) 25 MG tablet Take 1 tablet (25 mg total) by mouth daily. For mood stabilization 30 tablet 0  . medroxyPROGESTERone (DEPO-PROVERA) 150 MG/ML injection Inject 1 mL (150 mg total) into the muscle every 3 (three) months. For prevention of pregnancy 1 mL   . OLANZapine zydis (ZYPREXA) 15 MG disintegrating tablet Take 1 tablet (15 mg total) by mouth at bedtime. For mood control 30 tablet 0  . polyethylene glycol powder (GLYCOLAX/MIRALAX) powder Take 1 Container by mouth once.    . sodium chloride (OCEAN) 0.65 % SOLN nasal spray Place 1 spray into both nostrils as needed for congestion.  0   No current facility-administered medications on file prior to visit.     PAST MEDICAL HISTORY: Past Medical History:  Diagnosis Date  . Bipolar 1 disorder ([place])   . Bowel incontinence   . Depression   . Mental disability     PAST SURGICAL HISTORY: Past Surgical History:  Procedure Laterality Date  . FOOT SURGERY      SOCIAL HISTORY: Social History   Tobacco Use  . Smoking status: Never Smoker  . Smokeless tobacco: Never Used  Substance Use Topics  . Alcohol use: No  . Drug use: No    FAMILY HISTORY: Family History  Problem Relation Age of Onset  . High blood pressure Mother   . Anxiety disorder Mother   . Diabetes Maternal Grandmother    ROS: Review of Systems  Gastrointestinal: Negative for nausea and vomiting.  Musculoskeletal:       Negative for muscle weakness.   PHYSICAL EXAM:  Blood pressure 126/74, pulse 88, temperature 97.9 F (36.6 C), temperature source Oral, height 5\' 11"  (1.803 m), weight (!) 311 lb (141.1 kg), SpO2 98 %. Body mass index is 43.38 kg/m. Physical Exam Vitals signs reviewed.  Constitutional:      Appearance: Normal appearance. She is obese.  Cardiovascular:     Rate and Rhythm: Normal rate.     Pulses: Normal pulses.  Pulmonary:     Effort: Pulmonary effort is normal.     Breath sounds: Normal breath sounds.  Musculoskeletal: Normal range of motion.  Skin:    General: Skin is warm and dry.  Neurological:     Mental Status: She is alert and oriented to person, place, and time.  Psychiatric:        Behavior: Behavior normal.   RECENT LABS AND TESTS: BMET    Component Value Date/Time   NA 140 03/02/2019 1323   K 4.2 03/02/2019 1323   CL 105 03/02/2019 1323   CO2 19 (L) 03/02/2019 1323   GLUCOSE 91 03/02/2019 1323   GLUCOSE 99 09/09/2014  1846   BUN 8 03/02/2019 1323   CREATININE 0.78 03/02/2019 1323   CALCIUM 10.1 03/02/2019 1323   GFRNONAA 103 03/02/2019 1323   GFRAA 119 03/02/2019 1323   Lab Results  Component Value Date   HGBA1C 5.8 (H) 03/02/2019   HGBA1C 5.6 09/08/2018   HGBA1C 5.3 04/23/2018   HGBA1C 5.4 11/26/2017   HGBA1C 5.4 09/11/2014   Lab Results  Component Value Date   INSULIN 48.3 (H) 03/02/2019   INSULIN 31.2 (H) 04/23/2018   INSULIN 44.0 (H) 11/26/2017   CBC    Component Value Date/Time   WBC 9.5 09/08/2018   WBC 6.9 09/09/2014 1846   RBC 4.42 11/26/2017 0859   RBC 3.85 (L) 09/09/2014 1846   HGB 12.8 09/08/2018   HGB 13.0 11/26/2017 0859   HCT 18 (A) 09/08/2018   HCT 38.8 11/26/2017 0859   PLT 233 09/08/2018   MCV 88 11/26/2017 0859   MCH 29.4 11/26/2017 0859   MCH 30.1 09/09/2014 1846   MCHC 33.5 11/26/2017 0859   MCHC 34.0 09/09/2014 1846   RDW 13.7 11/26/2017 0859   LYMPHSABS 2.1 11/26/2017 0859   MONOABS 0.5 09/09/2014 1846   EOSABS 0.0 11/26/2017 0859   BASOSABS 0.0 11/26/2017  0859   Iron/TIBC/Ferritin/ %Sat No results found for: IRON, TIBC, FERRITIN, IRONPCTSAT Lipid Panel     Component Value Date/Time   CHOL 172 09/08/2018   CHOL 164 11/26/2017 0859   TRIG 131 09/08/2018   HDL 59 09/08/2018   HDL 44 11/26/2017 0859   CHOLHDL 4.1 09/11/2014 0630   VLDL 13 09/11/2014 0630   LDLCALC 90 09/08/2018   LDLCALC 97 11/26/2017 0859   Hepatic Function Panel     Component Value Date/Time   PROT 7.3 03/02/2019 1323   ALBUMIN 4.6 03/02/2019 1323   AST 16 03/02/2019 1323   ALT 13 03/02/2019 1323   ALKPHOS 103 03/02/2019 1323   BILITOT 0.4 03/02/2019 1323      Component Value Date/Time   TSH 2.800 11/26/2017 0859   TSH 3.374 09/11/2014 0630   Results for NARY, SNEED (MRN [id number]) as of 04/01/2019 07:13  Ref. Range 03/02/2019 13:23  Vitamin D, 25-Hydroxy Latest Ref Range: 30.0 - 100.0 ng/mL 40.0   OBESITY BEHAVIORAL INTERVENTION VISIT  Today's visit was #21   Starting weight: 289 lbs Starting date: 11/26/2017 Today's weight: 311 lbs  Today's date: 03/30/2019 Total lbs lost to date: 0    03/30/2019  Height 5\' 11"  (1.803 m)  Weight 311 lb (141.1 kg) (A)  BMI (Calculated) 43.4  BLOOD PRESSURE - SYSTOLIC 151  BLOOD PRESSURE - DIASTOLIC 74   Body Fat % 76.1 %  Total Body Water (lbs) 101.4 lbs   ASK: We discussed the diagnosis of obesity with Cindy Phillips today and Cindy Phillips agreed to give [place] permission to discuss obesity behavioral modification therapy today.  ASSESS: Cindy Phillips has the diagnosis of obesity and her BMI today is 43.5. Cindy Phillips is in the action stage of change.   ADVISE: Cindy Phillips was educated on the multiple health risks of obesity as well as the benefit of weight loss to improve her health. She was advised of the need for long term treatment and the importance of lifestyle modifications to improve her current health and to decrease her risk of future health problems.  AGREE: Multiple dietary modification options and  treatment options were discussed and  Cindy Phillips agreed to follow the recommendations documented in the above note.  ARRANGE: Cindy Phillips was educated on the  importance of frequent visits to treat obesity as outlined per CMS and USPSTF guidelines and agreed to schedule her next follow up appointment today.  I, Michaelene Song, am acting as Location manager for Dennard Nip, MD I have reviewed the above documentation for accuracy and completeness, and I agree with the above. -Dennard Nip, MD

## 2019-04-20 ENCOUNTER — Other Ambulatory Visit (INDEPENDENT_AMBULATORY_CARE_PROVIDER_SITE_OTHER): Payer: Self-pay | Admitting: Family Medicine

## 2019-04-20 DIAGNOSIS — E559 Vitamin D deficiency, unspecified: Secondary | ICD-10-CM

## 2019-04-22 ENCOUNTER — Encounter (INDEPENDENT_AMBULATORY_CARE_PROVIDER_SITE_OTHER): Payer: Self-pay | Admitting: Family Medicine

## 2019-04-22 ENCOUNTER — Other Ambulatory Visit: Payer: Self-pay

## 2019-04-22 ENCOUNTER — Telehealth (INDEPENDENT_AMBULATORY_CARE_PROVIDER_SITE_OTHER): Payer: Medicare Other | Admitting: Family Medicine

## 2019-04-22 DIAGNOSIS — Z6841 Body Mass Index (BMI) 40.0 and over, adult: Secondary | ICD-10-CM

## 2019-04-22 DIAGNOSIS — E119 Type 2 diabetes mellitus without complications: Secondary | ICD-10-CM

## 2019-04-22 DIAGNOSIS — E559 Vitamin D deficiency, unspecified: Secondary | ICD-10-CM | POA: Diagnosis not present

## 2019-04-22 MED ORDER — VITAMIN D (ERGOCALCIFEROL) 1.25 MG (50000 UNIT) PO CAPS: 50000.0000 [IU] | ORAL_CAPSULE | ORAL | 0 refills | Status: DC

## 2019-04-22 MED ORDER — METFORMIN HCL 500 MG PO TABS: ORAL_TABLET | ORAL | 0 refills | Status: DC

## 2019-04-28 DIAGNOSIS — L299 Pruritus, unspecified: Secondary | ICD-10-CM | POA: Diagnosis not present

## 2019-04-28 DIAGNOSIS — E1165 Type 2 diabetes mellitus with hyperglycemia: Secondary | ICD-10-CM | POA: Diagnosis not present

## 2019-04-28 DIAGNOSIS — E1169 Type 2 diabetes mellitus with other specified complication: Secondary | ICD-10-CM | POA: Diagnosis not present

## 2019-04-28 NOTE — Progress Notes (Addendum)
Office: [phone]  /  Fax: [phone] TeleHealth Visit:  [name] has verbally consented to this TeleHealth visit today. The patient is located at home, the provider is located at the [facility] office. The participants in this visit include the listed provider and patient. The visit was conducted today via telephone call (Doxy failed - changed to telephone call). I spent > than 50% of the 25 minute visit on counseling as documented in the note.   HPI:   Chief Complaint: OBESITY Cindy Phillips is here to discuss her progress with her obesity treatment plan. She is on the Category 1 plan and is following her eating plan approximately 60% of the time. She states she has been doing a lot of walking for exercise. Hadeel feels she has lost another 2 lbs. She admits to not following her plan and instead is mostly [facility]/[place].  We were unable to weigh the patient today for this TeleHealth visit. She feels as if she has lost weight since her last visit. She has lost 0 lbs since starting treatment with [place].  Pre-Diabetes Cindy Phillips has a diagnosis of prediabetes based on her elevated Hgb A1c and was informed this puts her at greater risk of developing diabetes. She is stable on metformin currently, but not recently following her plan. She started taking both doses in the a.m. and had increased GI upset. She denies nausea or hypoglycemia.  Vitamin D deficiency Cindy Phillips has a diagnosis of Vitamin D deficiency, which is not yet at goal. She is currently stable on prescription Vit D and denies nausea, vomiting or muscle weakness.  ASSESSMENT AND PLAN:  Class 3 severe obesity with serious comorbidity and body mass index (BMI) of 40.0 to 44.9 in adult, unspecified obesity type (HCC)  Vitamin D deficiency - Plan: Vitamin D, Ergocalciferol, (DRISDOL) 1.25 MG (50000 UT) CAPS capsule  Type 2 diabetes mellitus without complication, without long-term current use of insulin ([place]) - Plan:  metFORMIN (GLUCOPHAGE) 500 MG tablet  PLAN:  Pre-Diabetes Cindy Phillips will continue to work on weight loss, exercise, and decreasing simple carbohydrates in her diet to help decrease the risk of diabetes. We dicussed metformin including benefits and risks. She was informed that eating too many simple carbohydrates or too many calories at one sitting increases the likelihood of GI side effects. Cindy Phillips was given a refill on her metformin 500 mg #90 1 QAM and 1 QPM. She agrees to follow-up with our clinic in 3 weeks for an in-office visit.  Vitamin D Deficiency Cindy Phillips was informed that low Vitamin D levels contributes to fatigue and are associated with obesity, breast, and colon cancer. She agrees to continue to take prescription Vit D @ 50,000 IU every week #4 with 0 refills and will follow-up for routine testing of Vitamin D, at least 2-3 times per year. She was informed of the risk of over-replacement of Vitamin D and agrees to not increase her dose unless she discusses this with [place] first. Cindy Phillips agrees to follow-up with our clinic in 3 weeks.  Obesity Cindy Phillips is currently in the action stage of change. As such, her goal is to continue with weight loss efforts. She has agreed to portion control better and make smarter food choices, such as increase vegetables and decrease simple carbohydrates.  Cindy Phillips has been instructed to work up to a goal of 150 minutes of combined cardio and strengthening exercise per week for weight loss and overall health benefits. We discussed the following Behavioral Modification Strategies today: keeping  healthy foods in the home and better snacking choices.  Cindy Phillips has agreed to follow-up with our clinic in 3 weeks. She was informed of the importance of frequent follow-up visits to maximize her success with intensive lifestyle modifications for her multiple health conditions.  ALLERGIES: No Known Allergies  MEDICATIONS: Current Outpatient Medications on File Prior  to Visit  Medication Sig Dispense Refill  . atorvastatin (LIPITOR) 10 MG tablet Take 10 mg by mouth daily.    . hydrOXYzine (ATARAX/VISTARIL) 50 MG tablet Take 1 tablet (50 mg total) by mouth every 4 (four) hours as needed for anxiety. 45 tablet 0  . lamoTRIgine (LAMICTAL) 25 MG tablet Take 1 tablet (25 mg total) by mouth daily. For mood stabilization 30 tablet 0  . medroxyPROGESTERone (DEPO-PROVERA) 150 MG/ML injection Inject 1 mL (150 mg total) into the muscle every 3 (three) months. For prevention of pregnancy 1 mL   . OLANZapine zydis (ZYPREXA) 15 MG disintegrating tablet Take 1 tablet (15 mg total) by mouth at bedtime. For mood control 30 tablet 0  . polyethylene glycol powder (GLYCOLAX/MIRALAX) powder Take 1 Container by mouth once.    . sodium chloride (OCEAN) 0.65 % SOLN nasal spray Place 1 spray into both nostrils as needed for congestion.  0   No current facility-administered medications on file prior to visit.     PAST MEDICAL HISTORY: Past Medical History:  Diagnosis Date  . Bipolar 1 disorder ([place])   . Bowel incontinence   . Depression   . Mental disability     PAST SURGICAL HISTORY: Past Surgical History:  Procedure Laterality Date  . FOOT SURGERY      SOCIAL HISTORY: Social History   Tobacco Use  . Smoking status: Never Smoker  . Smokeless tobacco: Never Used  Substance Use Topics  . Alcohol use: No  . Drug use: No    FAMILY HISTORY: Family History  Problem Relation Age of Onset  . High blood pressure Mother   . Anxiety disorder Mother   . Diabetes Maternal Grandmother    ROS: Review of Systems  Gastrointestinal: Negative for nausea and vomiting.  Musculoskeletal:       Negative for muscle weakness.  Endo/Heme/Allergies:       Negative for hypoglycemia.   PHYSICAL EXAM: Pt in no acute distress  RECENT LABS AND TESTS: BMET    Component Value Date/Time   NA 140 03/02/2019 1323   K 4.2 03/02/2019 1323   CL 105 03/02/2019 1323   CO2 19 (L)  03/02/2019 1323   GLUCOSE 91 03/02/2019 1323   GLUCOSE 99 09/09/2014 1846   BUN 8 03/02/2019 1323   CREATININE 0.78 03/02/2019 1323   CALCIUM 10.1 03/02/2019 1323   GFRNONAA 103 03/02/2019 1323   GFRAA 119 03/02/2019 1323   Lab Results  Component Value Date   HGBA1C 5.8 (H) 03/02/2019   HGBA1C 5.6 09/08/2018   HGBA1C 5.3 04/23/2018   HGBA1C 5.4 11/26/2017   HGBA1C 5.4 09/11/2014   Lab Results  Component Value Date   INSULIN 48.3 (H) 03/02/2019   INSULIN 31.2 (H) 04/23/2018   INSULIN 44.0 (H) 11/26/2017   CBC    Component Value Date/Time   WBC 9.5 09/08/2018   WBC 6.9 09/09/2014 1846   RBC 4.42 11/26/2017 0859   RBC 3.85 (L) 09/09/2014 1846   HGB 12.8 09/08/2018   HGB 13.0 11/26/2017 0859   HCT 18 (A) 09/08/2018   HCT 38.8 11/26/2017 0859   PLT 233 09/08/2018   MCV 88  11/26/2017 0859   MCH 29.4 11/26/2017 0859   MCH 30.1 09/09/2014 1846   MCHC 33.5 11/26/2017 0859   MCHC 34.0 09/09/2014 1846   RDW 13.7 11/26/2017 0859   LYMPHSABS 2.1 11/26/2017 0859   MONOABS 0.5 09/09/2014 1846   EOSABS 0.0 11/26/2017 0859   BASOSABS 0.0 11/26/2017 0859   Iron/TIBC/Ferritin/ %Sat No results found for: IRON, TIBC, FERRITIN, IRONPCTSAT Lipid Panel     Component Value Date/Time   CHOL 172 09/08/2018   CHOL 164 11/26/2017 0859   TRIG 131 09/08/2018   HDL 59 09/08/2018   HDL 44 11/26/2017 0859   CHOLHDL 4.1 09/11/2014 0630   VLDL 13 09/11/2014 0630   LDLCALC 90 09/08/2018   LDLCALC 97 11/26/2017 0859   Hepatic Function Panel     Component Value Date/Time   PROT 7.3 03/02/2019 1323   ALBUMIN 4.6 03/02/2019 1323   AST 16 03/02/2019 1323   ALT 13 03/02/2019 1323   ALKPHOS 103 03/02/2019 1323   BILITOT 0.4 03/02/2019 1323      Component Value Date/Time   TSH 2.800 11/26/2017 0859   TSH 3.374 09/11/2014 0630   Results for KARI, AQUILA (MRN [id number]) as of 04/28/2019 07:18  Ref. Range 03/02/2019 13:23  Vitamin D, 25-Hydroxy Latest Ref Range: 30.0 - 100.0 ng/mL  40.0   I, Michaelene Song, am acting as Location manager for Dennard Nip, MD I have reviewed the above documentation for accuracy and completeness, and I agree with the above. -Dennard Nip, MD

## 2019-05-11 ENCOUNTER — Other Ambulatory Visit: Payer: Self-pay

## 2019-05-11 ENCOUNTER — Encounter (INDEPENDENT_AMBULATORY_CARE_PROVIDER_SITE_OTHER): Payer: Self-pay | Admitting: Family Medicine

## 2019-05-11 ENCOUNTER — Ambulatory Visit (INDEPENDENT_AMBULATORY_CARE_PROVIDER_SITE_OTHER): Payer: Medicare Other | Admitting: Family Medicine

## 2019-05-11 VITALS — BP 142/80 | HR 86 | Temp 97.9°F | Ht 71.0 in | Wt 312.0 lb

## 2019-05-11 DIAGNOSIS — R7303 Prediabetes: Secondary | ICD-10-CM | POA: Diagnosis not present

## 2019-05-11 DIAGNOSIS — E119 Type 2 diabetes mellitus without complications: Secondary | ICD-10-CM

## 2019-05-11 DIAGNOSIS — Z6841 Body Mass Index (BMI) 40.0 and over, adult: Secondary | ICD-10-CM

## 2019-05-11 DIAGNOSIS — E559 Vitamin D deficiency, unspecified: Secondary | ICD-10-CM | POA: Diagnosis not present

## 2019-05-11 MED ORDER — VITAMIN D (ERGOCALCIFEROL) 1.25 MG (50000 UNIT) PO CAPS: 50000.0000 [IU] | ORAL_CAPSULE | ORAL | 0 refills | Status: DC

## 2019-05-11 MED ORDER — METFORMIN HCL 500 MG PO TABS: ORAL_TABLET | ORAL | 0 refills | Status: DC

## 2019-05-13 ENCOUNTER — Encounter (HOSPITAL_COMMUNITY): Payer: Self-pay | Admitting: Emergency Medicine

## 2019-05-13 ENCOUNTER — Emergency Department (HOSPITAL_COMMUNITY)
Admission: EM | Admit: 2019-05-13 | Discharge: 2019-05-14 | Disposition: A | Discharge status: Home / Self Care | Payer: Medicare Other | Attending: Emergency Medicine | Admitting: Emergency Medicine

## 2019-05-13 ENCOUNTER — Other Ambulatory Visit: Payer: Self-pay

## 2019-05-13 DIAGNOSIS — R51 Headache: Secondary | ICD-10-CM | POA: Insufficient documentation

## 2019-05-13 DIAGNOSIS — Z7984 Long term (current) use of oral hypoglycemic drugs: Secondary | ICD-10-CM | POA: Diagnosis not present

## 2019-05-13 DIAGNOSIS — E119 Type 2 diabetes mellitus without complications: Secondary | ICD-10-CM | POA: Insufficient documentation

## 2019-05-13 DIAGNOSIS — Z79899 Other long term (current) drug therapy: Secondary | ICD-10-CM | POA: Insufficient documentation

## 2019-05-13 DIAGNOSIS — R110 Nausea: Secondary | ICD-10-CM | POA: Diagnosis not present

## 2019-05-13 DIAGNOSIS — R519 Headache, unspecified: Secondary | ICD-10-CM

## 2019-05-13 HISTORY — DX: Type 2 diabetes mellitus without complications: E11.9

## 2019-05-13 LAB — BASIC METABOLIC PANEL
Anion gap: 11 (ref 5–15)
BUN: 7 mg/dL (ref 6–20)
CO2: 22 mmol/L (ref 22–32)
Calcium: 9.7 mg/dL (ref 8.9–10.3)
Chloride: 106 mmol/L (ref 98–111)
Creatinine, Ser: 0.86 mg/dL (ref 0.44–1.00)
GFR calc Af Amer: >60 mL/min (ref >60)
GFR calc non Af Amer: >60 mL/min (ref >60)
Glucose, Bld: 99 mg/dL (ref 70–99)
Potassium: 3.5 mmol/L (ref 3.5–5.1)
Sodium: 139 mmol/L (ref 135–145)

## 2019-05-13 LAB — CBC WITH DIFFERENTIAL/PLATELET
Abs Immature Granulocytes: 0.02 10*3/uL (ref 0.00–0.07)
Basophils Absolute: 0 10*3/uL (ref 0.0–0.1)
Basophils Relative: 0 %
Eosinophils Absolute: 0 10*3/uL (ref 0.0–0.5)
Eosinophils Relative: 0 %
HCT: 40.1 % (ref 36.0–46.0)
Hemoglobin: 13.3 g/dL (ref 12.0–15.0)
Immature Granulocytes: 0 %
Lymphocytes Relative: 43 %
Lymphs Abs: 2.1 10*3/uL (ref 0.7–4.0)
MCH: 29.2 pg (ref 26.0–34.0)
MCHC: 33.2 g/dL (ref 30.0–36.0)
MCV: 88.1 fL (ref 80.0–100.0)
Monocytes Absolute: 0.5 10*3/uL (ref 0.1–1.0)
Monocytes Relative: 9 %
Neutro Abs: 2.4 10*3/uL (ref 1.7–7.7)
Neutrophils Relative %: 48 %
Platelets: 201 10*3/uL (ref 150–400)
RBC: 4.55 MIL/uL (ref 3.87–5.11)
RDW: 13.3 % (ref 11.5–15.5)
WBC: 5 10*3/uL (ref 4.0–10.5)
nRBC: 0 % (ref 0.0–0.2)

## 2019-05-13 LAB — I-STAT BETA HCG BLOOD, ED (MC, WL, AP ONLY): I-stat hCG, quantitative: <5 m[IU]/mL (ref <5)

## 2019-05-13 NOTE — Progress Notes (Signed)
Office: [phone]  /  Fax: [phone]   HPI:   Chief Complaint: OBESITY Cindy Phillips is here to discuss her progress with her obesity treatment plan. She is on the portion control better and make smarter food choices, such as increase vegetables and decrease simple carbohydrates and is following her eating plan approximately 0 % of the time. She states she is walking for 30-60 minutes 3 times per week. Cindy Phillips is working on maintaining her weight. She is not prepared to follow a structured plan and instead is trying to portion control and make smarter choices.  Her weight is (!) 312 lb (141.5 kg) today and has gained 1 lb since her last visit. She has lost 0 lbs since starting treatment with [place].  Pre-Diabetes Cindy Phillips has a diagnosis of pre-diabetes based on her elevated Hgb A1c and was informed this puts her at greater risk of developing diabetes. She is stable on metformin and denies nausea, vomiting, hypoglycemia. She has not been working on diet recently.  Vitamin D Deficiency Cindy Phillips has a diagnosis of vitamin D deficiency. She is stable on prescription Vit D and denies nausea, vomiting or muscle weakness.  ASSESSMENT AND PLAN:  Prediabetes - Plan: metFORMIN (GLUCOPHAGE) 500 MG tablet  Vitamin D deficiency - Plan: Vitamin D, Ergocalciferol, (DRISDOL) 1.25 MG (50000 UT) CAPS capsule  Class 3 severe obesity with serious comorbidity and body mass index (BMI) of 40.0 to 44.9 in adult, unspecified obesity type [facility])  PLAN:  Pre-Diabetes Cindy Phillips will continue to work on weight loss, exercise, and decreasing simple carbohydrates in her diet to help decrease the risk of diabetes. We dicussed metformin including benefits and risks. She was informed that eating too many simple carbohydrates or too many calories at one sitting increases the likelihood of GI side effects. Cindy Phillips requested metformin for now and a prescription was written today. Cindy Phillips agreed to follow up with [place] as directed  to monitor her progress.  Vitamin D Deficiency Cindy Phillips was informed that low vitamin D levels contributes to fatigue and are associated with obesity, breast, and colon cancer. She agrees to continue to take prescription Vit D 50,000 IU every week and will follow up for routine testing of vitamin D, at least 2-3 times per year. She was informed of the risk of over-replacement of vitamin D and agrees to not increase her dose unless she discusses this with [place] first.  Obesity Cindy Phillips is currently in the action stage of change. As such, her goal is to maintain weight for now She has agreed to portion control better and make smarter food choices, such as increase vegetables and decrease simple carbohydrates  Cindy Phillips has been instructed to work up to a goal of 150 minutes of combined cardio and strengthening exercise per week for weight loss and overall health benefits. We discussed the following Behavioral Modification Strategies today: increasing vegetables and emotional eating strategies   Cindy Phillips has agreed to follow up with our clinic in 3 weeks. She was informed of the importance of frequent follow up visits to maximize her success with intensive lifestyle modifications for her multiple health conditions.  ALLERGIES: No Known Allergies  MEDICATIONS: Current Outpatient Medications on File Prior to Visit  Medication Sig Dispense Refill  . atorvastatin (LIPITOR) 10 MG tablet Take 10 mg by mouth daily.    . hydrOXYzine (ATARAX/VISTARIL) 50 MG tablet Take 1 tablet (50 mg total) by mouth every 4 (four) hours as needed for anxiety. 45 tablet 0  . lamoTRIgine (LAMICTAL) 25 MG tablet  Take 1 tablet (25 mg total) by mouth daily. For mood stabilization 30 tablet 0  . medroxyPROGESTERone (DEPO-PROVERA) 150 MG/ML injection Inject 1 mL (150 mg total) into the muscle every 3 (three) months. For prevention of pregnancy 1 mL   . OLANZapine zydis (ZYPREXA) 15 MG disintegrating tablet Take 1 tablet (15 mg total)  by mouth at bedtime. For mood control 30 tablet 0  . polyethylene glycol powder (GLYCOLAX/MIRALAX) powder Take 1 Container by mouth once.    . sodium chloride (OCEAN) 0.65 % SOLN nasal spray Place 1 spray into both nostrils as needed for congestion.  0   No current facility-administered medications on file prior to visit.     PAST MEDICAL HISTORY: Past Medical History:  Diagnosis Date  . Bipolar 1 disorder ([place])   . Bowel incontinence   . Depression   . Mental disability     PAST SURGICAL HISTORY: Past Surgical History:  Procedure Laterality Date  . FOOT SURGERY      SOCIAL HISTORY: Social History   Tobacco Use  . Smoking status: Never Smoker  . Smokeless tobacco: Never Used  Substance Use Topics  . Alcohol use: No  . Drug use: No    FAMILY HISTORY: Family History  Problem Relation Age of Onset  . High blood pressure Mother   . Anxiety disorder Mother   . Diabetes Maternal Grandmother     ROS: Review of Systems  Constitutional: Negative for weight loss.  Gastrointestinal: Negative for nausea and vomiting.  Musculoskeletal:       Negative muscle weakness  Endo/Heme/Allergies:       Negative hypoglycemia    PHYSICAL EXAM: Blood pressure (!) 142/80, pulse 86, temperature 97.9 F (36.6 C), temperature source Oral, height 5\' 11"  (1.803 m), weight (!) 312 lb (141.5 kg), SpO2 96 %. Body mass index is 43.52 kg/m. Physical Exam Vitals signs reviewed.  Constitutional:      Appearance: Normal appearance. She is obese.  Cardiovascular:     Rate and Rhythm: Normal rate.     Pulses: Normal pulses.  Pulmonary:     Effort: Pulmonary effort is normal.     Breath sounds: Normal breath sounds.  Musculoskeletal: Normal range of motion.  Skin:    General: Skin is warm and dry.  Neurological:     Mental Status: She is alert and oriented to person, place, and time.  Psychiatric:        Mood and Affect: Mood normal.        Behavior: Behavior normal.     RECENT  LABS AND TESTS: BMET    Component Value Date/Time   NA 140 03/02/2019 1323   K 4.2 03/02/2019 1323   CL 105 03/02/2019 1323   CO2 19 (L) 03/02/2019 1323   GLUCOSE 91 03/02/2019 1323   GLUCOSE 99 09/09/2014 1846   BUN 8 03/02/2019 1323   CREATININE 0.78 03/02/2019 1323   CALCIUM 10.1 03/02/2019 1323   GFRNONAA 103 03/02/2019 1323   GFRAA 119 03/02/2019 1323   Lab Results  Component Value Date   HGBA1C 5.8 (H) 03/02/2019   HGBA1C 5.6 09/08/2018   HGBA1C 5.3 04/23/2018   HGBA1C 5.4 11/26/2017   HGBA1C 5.4 09/11/2014   Lab Results  Component Value Date   INSULIN 48.3 (H) 03/02/2019   INSULIN 31.2 (H) 04/23/2018   INSULIN 44.0 (H) 11/26/2017   CBC    Component Value Date/Time   WBC 9.5 09/08/2018   WBC 6.9 09/09/2014 1846   RBC 4.42  11/26/2017 0859   RBC 3.85 (L) 09/09/2014 1846   HGB 12.8 09/08/2018   HGB 13.0 11/26/2017 0859   HCT 18 (A) 09/08/2018   HCT 38.8 11/26/2017 0859   PLT 233 09/08/2018   MCV 88 11/26/2017 0859   MCH 29.4 11/26/2017 0859   MCH 30.1 09/09/2014 1846   MCHC 33.5 11/26/2017 0859   MCHC 34.0 09/09/2014 1846   RDW 13.7 11/26/2017 0859   LYMPHSABS 2.1 11/26/2017 0859   MONOABS 0.5 09/09/2014 1846   EOSABS 0.0 11/26/2017 0859   BASOSABS 0.0 11/26/2017 0859   Iron/TIBC/Ferritin/ %Sat No results found for: IRON, TIBC, FERRITIN, IRONPCTSAT Lipid Panel     Component Value Date/Time   CHOL 172 09/08/2018   CHOL 164 11/26/2017 0859   TRIG 131 09/08/2018   HDL 59 09/08/2018   HDL 44 11/26/2017 0859   CHOLHDL 4.1 09/11/2014 0630   VLDL 13 09/11/2014 0630   LDLCALC 90 09/08/2018   LDLCALC 97 11/26/2017 0859   Hepatic Function Panel     Component Value Date/Time   PROT 7.3 03/02/2019 1323   ALBUMIN 4.6 03/02/2019 1323   AST 16 03/02/2019 1323   ALT 13 03/02/2019 1323   ALKPHOS 103 03/02/2019 1323   BILITOT 0.4 03/02/2019 1323      Component Value Date/Time   TSH 2.800 11/26/2017 0859   TSH 3.374 09/11/2014 0630      I have  reviewed the above documentation for accuracy and completeness, and I agree with the above. -Dennard Nip, MD

## 2019-05-13 NOTE — ED Triage Notes (Signed)
Patient reports chronic migraine headache for several weeks unrelieved by OTC pain medications  , denies head injury , alert and oriented , no emesis or fever .

## 2019-05-14 DIAGNOSIS — R51 Headache: Secondary | ICD-10-CM | POA: Diagnosis not present

## 2019-05-14 MED ORDER — KETOROLAC TROMETHAMINE 30 MG/ML IJ SOLN
30.0000 mg | Freq: Once | INTRAMUSCULAR | Status: DC
  Filled 2019-05-14: qty 1

## 2019-05-14 MED ORDER — KETOROLAC TROMETHAMINE 60 MG/2ML IM SOLN
30.0000 mg | Freq: Once | INTRAMUSCULAR | Status: AC
  Administered 2019-05-14: 01:00:00 30 mg via INTRAMUSCULAR

## 2019-05-14 MED ORDER — DIPHENHYDRAMINE HCL 50 MG/ML IJ SOLN
25.0000 mg | Freq: Once | INTRAMUSCULAR | Status: DC
  Filled 2019-05-14: qty 1

## 2019-05-14 MED ORDER — DIPHENHYDRAMINE HCL 50 MG/ML IJ SOLN
25.0000 mg | Freq: Once | INTRAMUSCULAR | Status: AC
  Administered 2019-05-14: 25 mg via INTRAMUSCULAR

## 2019-05-14 MED ORDER — PROCHLORPERAZINE EDISYLATE 10 MG/2ML IJ SOLN
10.0000 mg | Freq: Once | INTRAMUSCULAR | Status: DC
  Filled 2019-05-14: qty 2

## 2019-05-14 MED ORDER — METOCLOPRAMIDE HCL 5 MG/ML IJ SOLN
10.0000 mg | Freq: Once | INTRAMUSCULAR | Status: AC
  Administered 2019-05-14: 10 mg via INTRAMUSCULAR
  Filled 2019-05-14: qty 2

## 2019-05-14 MED ORDER — PROCHLORPERAZINE EDISYLATE 10 MG/2ML IJ SOLN
10.0000 mg | Freq: Once | INTRAMUSCULAR | Status: AC
  Administered 2019-05-14: 01:00:00 10 mg via INTRAMUSCULAR

## 2019-05-14 MED ORDER — ONDANSETRON 4 MG PO TBDP: 4.0000 mg | ORAL_TABLET | Freq: Three times a day (TID) | ORAL | 0 refills | Status: DC | PRN

## 2019-05-14 MED ORDER — SODIUM CHLORIDE 0.9 % IV BOLUS: 1000.0000 mL | Freq: Once | INTRAVENOUS | Status: DC

## 2019-05-14 MED ORDER — DEXAMETHASONE SODIUM PHOSPHATE 10 MG/ML IJ SOLN
10.0000 mg | Freq: Once | INTRAMUSCULAR | Status: AC
  Administered 2019-05-14: 02:00:00 10 mg via INTRAMUSCULAR
  Filled 2019-05-14: qty 1

## 2019-05-14 NOTE — Discharge Instructions (Signed)
Thank you for allowing me to care for you today in the Emergency Department.   You can let 1 tablet of Zofran dissolve under your tongue every 8 hours as needed for nausea.  Make sure that you are drinking plenty of fluids.  Try to drink at least 64 ounces of fluids every day to avoid dehydration.  Take 650 mg of Tylenol or 600 mg of ibuprofen with food every 6 hours for pain.  You can alternate between these 2 medications every 3 hours if your pain returns.  For instance, you can take Tylenol at noon, followed by a dose of ibuprofen at 3, followed by second dose of Tylenol and 6.  Follow-up with primary care if you continue to have recurrent headaches.  Return to the emergency department if you develop a sudden onset severe headache with numbness, weakness, slurred speech, facial drooping, chest pain, high fevers, confusion, or other new, concerning symptoms.

## 2019-05-14 NOTE — ED Notes (Signed)
Pt verbalized understanding of d/c instructions, scripts and s/s requiring return to ed

## 2019-05-14 NOTE — ED Provider Notes (Signed)
[facility] EMERGENCY DEPARTMENT Provider Note   CSN: [id number] Arrival date & time: 05/13/19  1945     History   Chief Complaint Chief Complaint  Patient presents with  . Headache    HPI Cindy Phillips is a 28 y.o. female with a history of diabetes mellitus type 2, depression, bipolar disorder who presents to the emergency department with a chief complaint of headache.  The patient reports that she has been having intermittent headaches for the last 3 weeks.  She reports that her current headache is located over her left forehead and has been present since she awoke this morning.  She characterizes the pain as "feeling like I am getting hit in the head" she reports that her headaches that have been coming and going over the last few weeks have been coming on gradually as well as dissipating gradually.  She reports that she has been feeling nauseated so she has not been eating and drinking as well as she usually does.  She reports a history of similar headaches over the left forehead, but states that this is worse.  She does report that she had some sneezing and nasal congestion intermittently over the last few weeks.  She denies tinnitus, diplopia, blurred vision, change in visual fields, dizziness, lightheadedness, gait changes, neck pain or stiffness, chest pain, shortness of breath, numbness, weakness, fever, chills, slurred speech, facial droop, abdominal pain, vomiting, diarrhea, or constipation.  She reports that she took 400 mg of ibuprofen earlier today with no improvement in her headache so she came to the ER for further evaluation.    The history is provided by the patient. No language interpreter was used.    Past Medical History:  Diagnosis Date  . Bipolar 1 disorder ([place])   . Bowel incontinence   . Depression   . Diabetes mellitus without complication ([place])   . Mental disability     Patient Active Problem List   Diagnosis Date Noted  .  Prediabetes 02/03/2019  . Obstructive sleep apnea 04/23/2018  . Other fatigue 11/26/2017  . Shortness of breath on exertion 11/26/2017  . Type 2 diabetes mellitus without complication, without long-term current use of insulin ([place]) 11/26/2017  . Other hyperlipidemia 11/26/2017  . Vitamin D deficiency 11/26/2017  . Schizoaffective disorder, bipolar type ([place]) 09/14/2014  . Intellectual disability 09/10/2014    Past Surgical History:  Procedure Laterality Date  . FOOT SURGERY       OB History    Gravida  0   Para  0   Term  0   Preterm  0   AB  0   Living  0     SAB  0   TAB  0   Ectopic  0   Multiple  0   Live Births  0            Home Medications    Prior to Admission medications   Medication Sig Start Date End Date Taking? Authorizing Provider  atorvastatin (LIPITOR) 10 MG tablet Take 10 mg by mouth daily.    [provider]  hydrOXYzine (ATARAX/VISTARIL) 50 MG tablet Take 1 tablet (50 mg total) by mouth every 4 (four) hours as needed for anxiety. 09/14/14   Lindell Spar I, NP  lamoTRIgine (LAMICTAL) 25 MG tablet Take 1 tablet (25 mg total) by mouth daily. For mood stabilization 09/14/14   Lindell Spar I, NP  medroxyPROGESTERone (DEPO-PROVERA) 150 MG/ML injection Inject 1 mL (150 mg total)  into the muscle every 3 (three) months. For prevention of pregnancy 09/14/14   Lindell Spar I, NP  metFORMIN (GLUCOPHAGE) 500 MG tablet Take two tabs p o Qam and One in the  PM 05/11/19   Dennard Nip D, MD  OLANZapine zydis (ZYPREXA) 15 MG disintegrating tablet Take 1 tablet (15 mg total) by mouth at bedtime. For mood control 09/14/14   Lindell Spar I, NP  ondansetron (ZOFRAN ODT) 4 MG disintegrating tablet Take 1 tablet (4 mg total) by mouth every 8 (eight) hours as needed. 05/14/19   Olly Shiner A, PA-C  polyethylene glycol powder (GLYCOLAX/MIRALAX) powder Take 1 Container by mouth once.    [provider]  sodium chloride (OCEAN) 0.65 % SOLN nasal spray  Place 1 spray into both nostrils as needed for congestion. 09/14/14   Lindell Spar I, NP  Vitamin D, Ergocalciferol, (DRISDOL) 1.25 MG (50000 UT) CAPS capsule Take 1 capsule (50,000 Units total) by mouth every 7 (seven) days. 05/11/19   Starlyn Skeans, MD    Family History Family History  Problem Relation Age of Onset  . High blood pressure Mother   . Anxiety disorder Mother   . Diabetes Maternal Grandmother     Social History Social History   Tobacco Use  . Smoking status: Never Smoker  . Smokeless tobacco: Never Used  Substance Use Topics  . Alcohol use: No  . Drug use: No     Allergies   Patient has no known allergies.   Review of Systems Review of Systems  Constitutional: Negative for activity change, chills and fever.  HENT: Positive for congestion. Negative for ear discharge, ear pain, facial swelling, mouth sores, nosebleeds, sinus pressure, sinus pain and sore throat.   Eyes: Negative for visual disturbance.  Respiratory: Negative for shortness of breath and wheezing.   Cardiovascular: Negative for chest pain.  Gastrointestinal: Positive for nausea. Negative for abdominal pain, anal bleeding, blood in stool, constipation, diarrhea and vomiting.  Genitourinary: Negative for dysuria, flank pain and urgency.  Musculoskeletal: Negative for arthralgias, back pain, myalgias, neck pain and neck stiffness.  Skin: Negative for rash.  Allergic/Immunologic: Negative for immunocompromised state.  Neurological: Positive for headaches. Negative for dizziness, seizures, syncope, weakness and numbness.  Psychiatric/Behavioral: Negative for confusion.     Physical Exam Updated Vital Signs BP 127/67 (BP Location: Right Arm)   Pulse 88   Temp 98.5 F (36.9 C) (Oral)   Resp 18   SpO2 99%   Physical Exam Vitals signs and nursing note reviewed.  Constitutional:      General: She is not in acute distress.    Appearance: She is obese. She is not ill-appearing,  toxic-appearing or diaphoretic.  HENT:     Head: Normocephalic.     Comments: Tender to palpation over the left frontal sinus.  No tenderness palpation of the right frontal sinus.  No pain with palpation over the bilateral temporal arteries.  Mild congestion in the nose.  Erythema noted in the right canal, but TMs are normal bilaterally. Eyes:     Extraocular Movements: Extraocular movements intact.     Conjunctiva/sclera: Conjunctivae normal.     Pupils: Pupils are equal, round, and reactive to light.  Neck:     Musculoskeletal: Neck supple.     Comments: No meningeal signs. Cardiovascular:     Rate and Rhythm: Normal rate and regular rhythm.     Heart sounds: No murmur. No friction rub. No gallop.   Pulmonary:  Effort: Pulmonary effort is normal. No respiratory distress.  Abdominal:     General: There is no distension.     Palpations: Abdomen is soft. There is no mass.     Tenderness: There is no abdominal tenderness. There is no right CVA tenderness, left CVA tenderness, guarding or rebound.     Hernia: No hernia is present.  Skin:    General: Skin is warm.     Findings: No rash.  Neurological:     Mental Status: She is alert.     Comments: Alert and oriented x3.  Cranial nerves II through XII are grossly intact.  5-5 strength against resistance of the bilateral upper and lower extremities.  Sensation is intact and equal throughout.  Gait is not ataxic.  Follows simple commands. No pronator drift.  Psychiatric:        Behavior: Behavior normal.      ED Treatments / Results  Labs (all labs ordered are listed, but only abnormal results are displayed) Labs Reviewed  CBC WITH DIFFERENTIAL/PLATELET  BASIC METABOLIC PANEL  I-STAT BETA HCG BLOOD, ED ([facility], [facility], [facility] ONLY)    EKG None  Radiology No results found.  Procedures Procedures (including critical care time)  Medications Ordered in ED Medications  diphenhydrAMINE (BENADRYL) injection 25 mg (25 mg Intramuscular  Given 05/14/19 0035)  ketorolac (TORADOL) injection 30 mg (30 mg Intramuscular Given 05/14/19 0036)  prochlorperazine (COMPAZINE) injection 10 mg (10 mg Intramuscular Given 05/14/19 0035)  dexamethasone (DECADRON) injection 10 mg (10 mg Intramuscular Given 05/14/19 0152)  metoCLOPramide (REGLAN) injection 10 mg (10 mg Intramuscular Given 05/14/19 0152)     Initial Impression / Assessment and Plan / ED Course  I have reviewed the triage vital signs and the nursing notes.  Pertinent labs & imaging results that were available during my care of the patient were reviewed by me and considered in my medical decision making (see chart for details).        28 year old female with a history of diabetes mellitus type 2, depression, bipolar disorder presenting with a headache, onset today.  She does report she has been having more frequent headaches over the last few weeks that are been coming and going.  Her headache today is localized over her left forehead.  She has had a history of previous headaches over several years and is previously had to come to the ER.  No change in quality of headache today.  She is neurologically intact without focal deficit.  Doubt aneurysm, pseudotumor cerebri, intracranial mass, CVA, ICH, or SAH.  No meningeal signs.  Although she has been endorsing nasal congestion, her headaches have been intermittent so less likely acute bacterial sinusitis.  Initial blood pressure was 150/85, but I suspect this is falsely elevated as cough is not appropriately placed on the patient.  Heart rate of 116 was with Dinamap, but now the patient is on a cardiac monitor, heart rate has been less than 100 on multiple re-evaluations in the room.  She is normotensive without treatment and afebrile.  She is initially given migraine cocktail with Toradol, Compazine, and Benadryl.  Staff is unable to gain IV access and was given IM medications instead.  On reevaluation, she reports no improvement in her  headache.  Will give Reglan and Decadron and reassess.   On reevaluation, she reports that her headache is resolved, she feels much better, and she is ready to be discharged home.  She has been advised to follow-up with primary care if she  continues to have frequent intermittent headaches.  ER return precautions also given.  She is hemodynamically stable and in no acute distress.  Safe for discharge to home with outpatient follow-up as needed.  Final Clinical Impressions(s) / ED Diagnoses   Final diagnoses:  Acute nonintractable headache, unspecified headache type  Nausea    ED Discharge Orders         Ordered    ondansetron (ZOFRAN ODT) 4 MG disintegrating tablet  Every 8 hours PRN     05/14/19 0238           Keimari  A, PA-C [id number]    Delora Fuel, MD [phone]

## 2019-05-18 ENCOUNTER — Encounter (INDEPENDENT_AMBULATORY_CARE_PROVIDER_SITE_OTHER): Payer: Self-pay

## 2019-06-06 DIAGNOSIS — E1169 Type 2 diabetes mellitus with other specified complication: Secondary | ICD-10-CM | POA: Diagnosis not present

## 2019-06-06 DIAGNOSIS — R519 Headache, unspecified: Secondary | ICD-10-CM | POA: Diagnosis not present

## 2019-06-08 ENCOUNTER — Ambulatory Visit (INDEPENDENT_AMBULATORY_CARE_PROVIDER_SITE_OTHER): Payer: Medicare Other | Admitting: Physician Assistant

## 2019-06-08 ENCOUNTER — Other Ambulatory Visit: Payer: Self-pay

## 2019-06-08 ENCOUNTER — Ambulatory Visit (INDEPENDENT_AMBULATORY_CARE_PROVIDER_SITE_OTHER): Payer: Medicare Other | Admitting: Family Medicine

## 2019-06-08 ENCOUNTER — Encounter (INDEPENDENT_AMBULATORY_CARE_PROVIDER_SITE_OTHER): Payer: Self-pay | Admitting: Physician Assistant

## 2019-06-08 VITALS — BP 134/85 | HR 86 | Temp 98.2°F | Ht 71.0 in | Wt 306.0 lb

## 2019-06-08 DIAGNOSIS — Z6841 Body Mass Index (BMI) 40.0 and over, adult: Secondary | ICD-10-CM | POA: Diagnosis not present

## 2019-06-08 DIAGNOSIS — E559 Vitamin D deficiency, unspecified: Secondary | ICD-10-CM | POA: Diagnosis not present

## 2019-06-10 NOTE — Progress Notes (Signed)
Office: [phone]  /  Fax: [phone]   HPI:   Chief Complaint: OBESITY Cindy Phillips is here to discuss her progress with her obesity treatment plan. She is on the portion control better and make smarter food choices plan and she is following her eating plan approximately 0 % of the time. She states she is walking 60 minutes 3 times per week. Cathyann reports that she is bored at home and she is snacking all day on cookies and chips. She is interested in looking at a vegetarian meal plan, as she is tired of eating meat. Her weight is (!) 306 lb (138.8 kg) today and has had a weight loss of 6 pounds over a period of 4 weeks since her last visit. She has gained 17 lbs since starting treatment with [place].  Vitamin D deficiency Cindy Phillips has a diagnosis of vitamin D deficiency. Cindy Phillips is currently taking vit D and she denies nausea, vomiting or muscle weakness.  ASSESSMENT AND PLAN:  Vitamin D deficiency  Class 3 severe obesity with serious comorbidity and body mass index (BMI) of 40.0 to 44.9 in adult, unspecified obesity type ([place])  PLAN:  Vitamin D Deficiency Cindy Phillips was informed that low vitamin D levels contributes to fatigue and are associated with obesity, breast, and colon cancer. Cindy Phillips agrees to continue to take prescription Vit D @50 ,000 IU every week and she will follow up for routine testing of vitamin D, at least 2-3 times per year. She was informed of the risk of over-replacement of vitamin D and agrees to not increase her dose unless she discusses this with [place] first.  Obesity Cindy Phillips is currently in the action stage of change. As such, her goal is to continue with weight loss efforts She has agreed to portion control better and make smarter food choices, such as increase vegetables and decrease simple carbohydrates  Cindy Phillips has been instructed to work up to a goal of 150 minutes of combined cardio and strengthening exercise per week for weight loss and overall health  benefits. We discussed the following Behavioral Modification Strategies today: decreasing simple carbohydrates  and work on meal planning and easy cooking plans  Cindy Phillips has agreed to follow up with our clinic in 3 to 4 weeks. She was informed of the importance of frequent follow up visits to maximize her success with intensive lifestyle modifications for her multiple health conditions.  ALLERGIES: No Known Allergies  MEDICATIONS: Current Outpatient Medications on File Prior to Visit  Medication Sig Dispense Refill   atorvastatin (LIPITOR) 10 MG tablet Take 10 mg by mouth daily.     hydrOXYzine (ATARAX/VISTARIL) 50 MG tablet Take 1 tablet (50 mg total) by mouth every 4 (four) hours as needed for anxiety. 45 tablet 0   lamoTRIgine (LAMICTAL) 25 MG tablet Take 1 tablet (25 mg total) by mouth daily. For mood stabilization 30 tablet 0   medroxyPROGESTERone (DEPO-PROVERA) 150 MG/ML injection Inject 1 mL (150 mg total) into the muscle every 3 (three) months. For prevention of pregnancy 1 mL    metFORMIN (GLUCOPHAGE) 500 MG tablet Take two tabs p o Qam and One in the  PM 90 tablet 0   OLANZapine zydis (ZYPREXA) 15 MG disintegrating tablet Take 1 tablet (15 mg total) by mouth at bedtime. For mood control 30 tablet 0   ondansetron (ZOFRAN ODT) 4 MG disintegrating tablet Take 1 tablet (4 mg total) by mouth every 8 (eight) hours as needed. 20 tablet 0   polyethylene glycol powder (GLYCOLAX/MIRALAX) powder Take 1  Container by mouth once.     sodium chloride (OCEAN) 0.65 % SOLN nasal spray Place 1 spray into both nostrils as needed for congestion.  0   Vitamin D, Ergocalciferol, (DRISDOL) 1.25 MG (50000 UT) CAPS capsule Take 1 capsule (50,000 Units total) by mouth every 7 (seven) days. 4 capsule 0   No current facility-administered medications on file prior to visit.     PAST MEDICAL HISTORY: Past Medical History:  Diagnosis Date   Bipolar 1 disorder ([place])    Bowel incontinence     Depression    Diabetes mellitus without complication ([place])    Mental disability     PAST SURGICAL HISTORY: Past Surgical History:  Procedure Laterality Date   FOOT SURGERY      SOCIAL HISTORY: Social History   Tobacco Use   Smoking status: Never Smoker   Smokeless tobacco: Never Used  Substance Use Topics   Alcohol use: No   Drug use: No    FAMILY HISTORY: Family History  Problem Relation Age of Onset   High blood pressure Mother    Anxiety disorder Mother    Diabetes Maternal Grandmother     ROS: Review of Systems  Constitutional: Positive for weight loss.  Gastrointestinal: Negative for nausea and vomiting.  Musculoskeletal:       Negative for muscle weakness    PHYSICAL EXAM: Blood pressure 134/85, pulse 86, temperature 98.2 F (36.8 C), temperature source Oral, height 5\' 11"  (1.803 m), weight (!) 306 lb (138.8 kg), SpO2 97 %. Body mass index is 42.68 kg/m. Physical Exam Vitals signs reviewed.  Constitutional:      Appearance: Normal appearance. She is well-developed. She is obese.  Cardiovascular:     Rate and Rhythm: Normal rate.  Pulmonary:     Effort: Pulmonary effort is normal.  Musculoskeletal: Normal range of motion.  Skin:    General: Skin is warm and dry.  Neurological:     Mental Status: She is alert and oriented to person, place, and time.  Psychiatric:        Mood and Affect: Mood normal.        Behavior: Behavior normal.     RECENT LABS AND TESTS: BMET    Component Value Date/Time   NA 139 05/13/2019 2010   NA 140 03/02/2019 1323   K 3.5 05/13/2019 2010   CL 106 05/13/2019 2010   CO2 22 05/13/2019 2010   GLUCOSE 99 05/13/2019 2010   BUN 7 05/13/2019 2010   BUN 8 03/02/2019 1323   CREATININE 0.86 05/13/2019 2010   CALCIUM 9.7 05/13/2019 2010   GFRNONAA >60 05/13/2019 2010   GFRAA >60 05/13/2019 2010   Lab Results  Component Value Date   HGBA1C 5.8 (H) 03/02/2019   HGBA1C 5.6 09/08/2018   HGBA1C 5.3 04/23/2018    HGBA1C 5.4 11/26/2017   HGBA1C 5.4 09/11/2014   Lab Results  Component Value Date   INSULIN 48.3 (H) 03/02/2019   INSULIN 31.2 (H) 04/23/2018   INSULIN 44.0 (H) 11/26/2017   CBC    Component Value Date/Time   WBC 5.0 05/13/2019 2010   RBC 4.55 05/13/2019 2010   HGB 13.3 05/13/2019 2010   HGB 13.0 11/26/2017 0859   HCT 40.1 05/13/2019 2010   HCT 38.8 11/26/2017 0859   PLT 201 05/13/2019 2010   MCV 88.1 05/13/2019 2010   MCV 88 11/26/2017 0859   MCH 29.2 05/13/2019 2010   MCHC 33.2 05/13/2019 2010   RDW 13.3 05/13/2019 2010  RDW 13.7 11/26/2017 0859   LYMPHSABS 2.1 05/13/2019 2010   LYMPHSABS 2.1 11/26/2017 0859   MONOABS 0.5 05/13/2019 2010   EOSABS 0.0 05/13/2019 2010   EOSABS 0.0 11/26/2017 0859   BASOSABS 0.0 05/13/2019 2010   BASOSABS 0.0 11/26/2017 0859   Iron/TIBC/Ferritin/ %Sat No results found for: IRON, TIBC, FERRITIN, IRONPCTSAT Lipid Panel     Component Value Date/Time   CHOL 172 09/08/2018   CHOL 164 11/26/2017 0859   TRIG 131 09/08/2018   HDL 59 09/08/2018   HDL 44 11/26/2017 0859   CHOLHDL 4.1 09/11/2014 0630   VLDL 13 09/11/2014 0630   LDLCALC 90 09/08/2018   LDLCALC 97 11/26/2017 0859   Hepatic Function Panel     Component Value Date/Time   PROT 7.3 03/02/2019 1323   ALBUMIN 4.6 03/02/2019 1323   AST 16 03/02/2019 1323   ALT 13 03/02/2019 1323   ALKPHOS 103 03/02/2019 1323   BILITOT 0.4 03/02/2019 1323      Component Value Date/Time   TSH 2.800 11/26/2017 0859   TSH 3.374 09/11/2014 0630     Ref. Range 03/02/2019 13:23  Vitamin D, 25-Hydroxy Latest Ref Range: 30.0 - 100.0 ng/mL 40.0    OBESITY BEHAVIORAL INTERVENTION VISIT  Today's visit was # 17   Starting weight: 289 lbs Starting date: 11/26/2017 Today's weight : 306 lbs Today's date: 06/08/2019 Total lbs lost to date: 0    06/08/2019  Height 5\' 11"  (1.803 m)  Weight 306 lb (138.8 kg) (A)  BMI (Calculated) 42.7  BLOOD PRESSURE - SYSTOLIC [id number]  BLOOD PRESSURE -  DIASTOLIC 85   Body Fat % [id number] %  Total Body Water (lbs) 104.4 lbs    ASK: We discussed the diagnosis of obesity with Cindy Phillips today and Cindy Phillips agreed to give [place] permission to discuss obesity behavioral modification therapy today.  ASSESS: Cindy Phillips has the diagnosis of obesity and her BMI today is 42.7 Cindy Phillips is in the action stage of change   ADVISE: Cindy Phillips was educated on the multiple health risks of obesity as well as the benefit of weight loss to improve her health. She was advised of the need for long term treatment and the importance of lifestyle modifications to improve her current health and to decrease her risk of future health problems.  AGREE: Multiple dietary modification options and treatment options were discussed and  Cindy Phillips agreed to follow the recommendations documented in the above note.  ARRANGE: Cindy Phillips was educated on the importance of frequent visits to treat obesity as outlined per CMS and USPSTF guidelines and agreed to schedule her next follow up appointment today.  Corey Skains, am acting as transcriptionist for Abby Potash, PA-C I, Abby Potash, PA-C have reviewed above note and agree with its content

## 2019-06-15 ENCOUNTER — Other Ambulatory Visit: Payer: Self-pay | Admitting: Family Medicine

## 2019-06-15 DIAGNOSIS — R519 Headache, unspecified: Secondary | ICD-10-CM | POA: Diagnosis not present

## 2019-06-18 ENCOUNTER — Ambulatory Visit
Admission: RE | Admit: 2019-06-18 | Discharge: 2019-06-18 | Disposition: A | Discharge status: Home / Self Care | Payer: Medicaid Other | Source: Ambulatory Visit | Attending: Family Medicine | Admitting: Family Medicine

## 2019-06-18 DIAGNOSIS — R519 Headache, unspecified: Secondary | ICD-10-CM

## 2019-06-18 IMAGING — CT CT HEAD W/O CM
3 of 4 series · 14 of 47 positions shown, 16 images · non-contrast
Comparison: None.

CLINICAL DATA: Left temporal region headache

EXAM:
CT HEAD WITHOUT CONTRAST
TECHNIQUE: Contiguous axial images were obtained from the base of the skull
through the vertex without intravenous contrast.

[Series 2: head 5.00 hr40 s3 axial ibhc · axial · 0.47mm/px · z∈[-546,-411]mm · 8 of 33 slices shown, 10 images]
[im 3/33  brain]
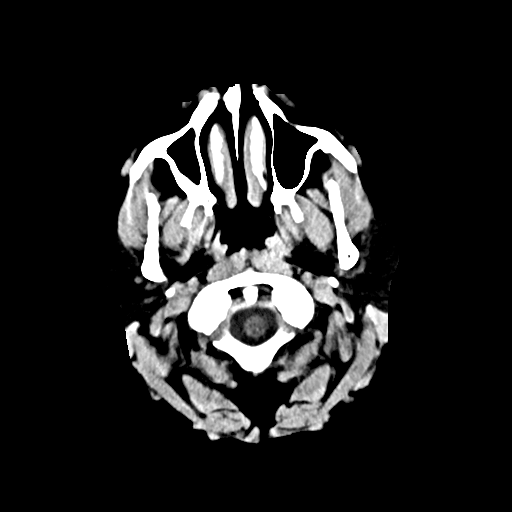
[im 3/33  bone]
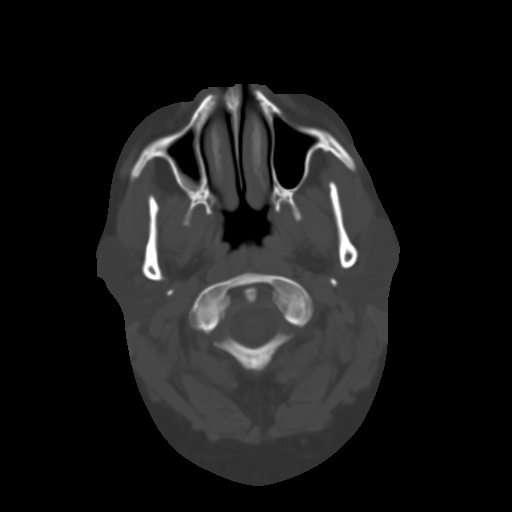
[im 7/33  brain]
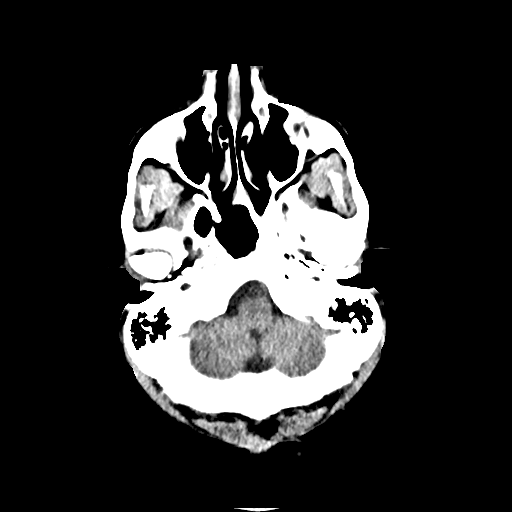
[im 12/33  brain]
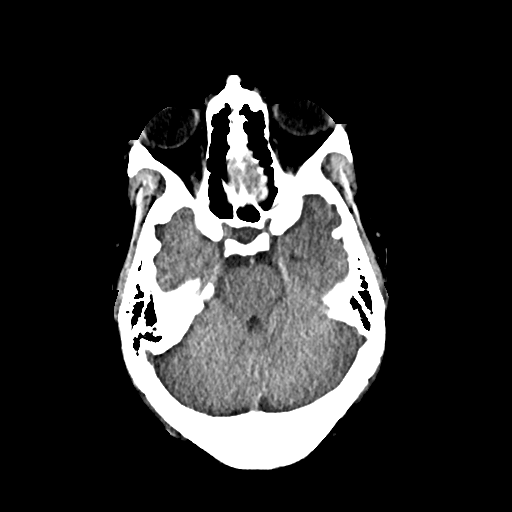
[im 14/33  brain]
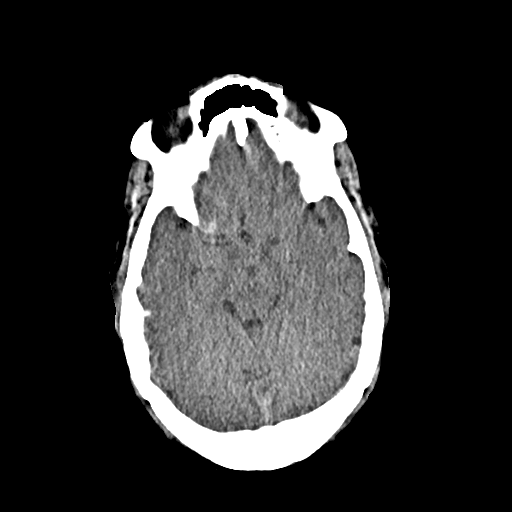
[im 19/33  brain]
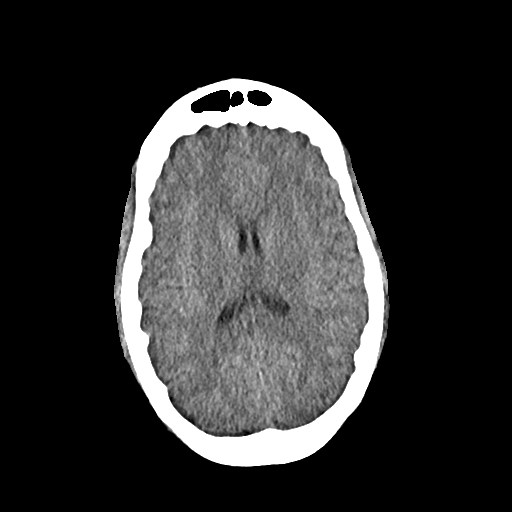
[im 19/33  bone]
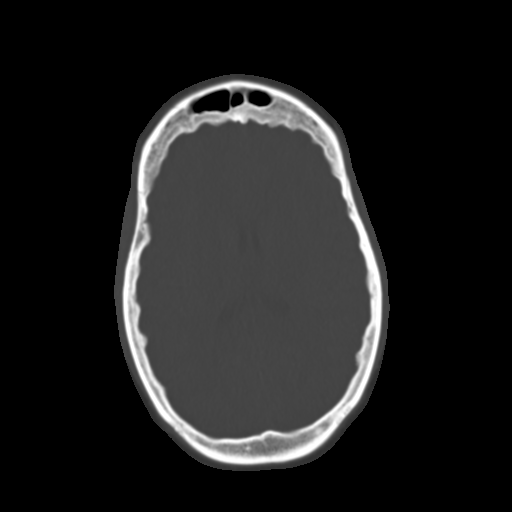
[im 21/33  brain]
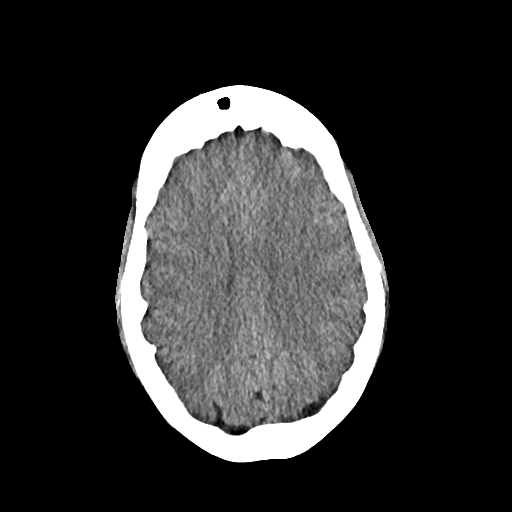
[im 26/33  brain]
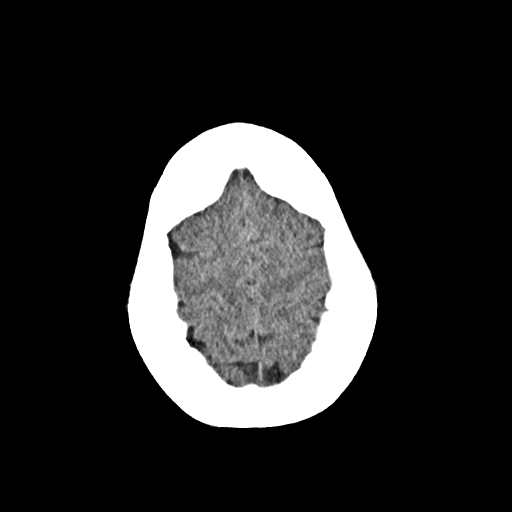
[im 30/33  brain]
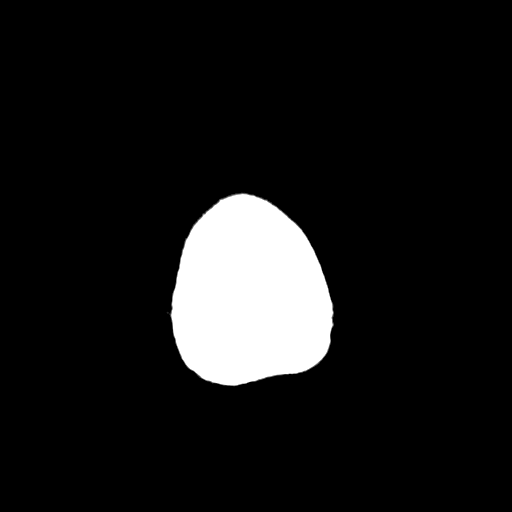

[Series 4: head 3.00 hr40 s3 sag · sagittal · 0.33mm/px · 3 of 80 slices shown]
[im 27/80  brain]
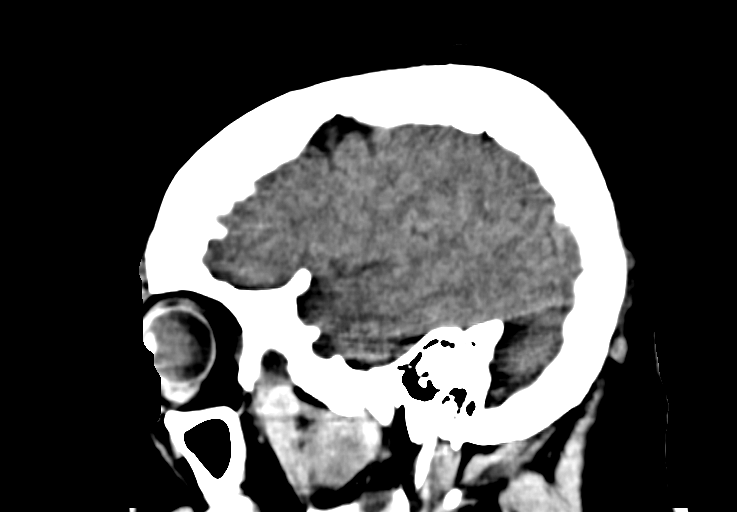
[im 40/80  brain]
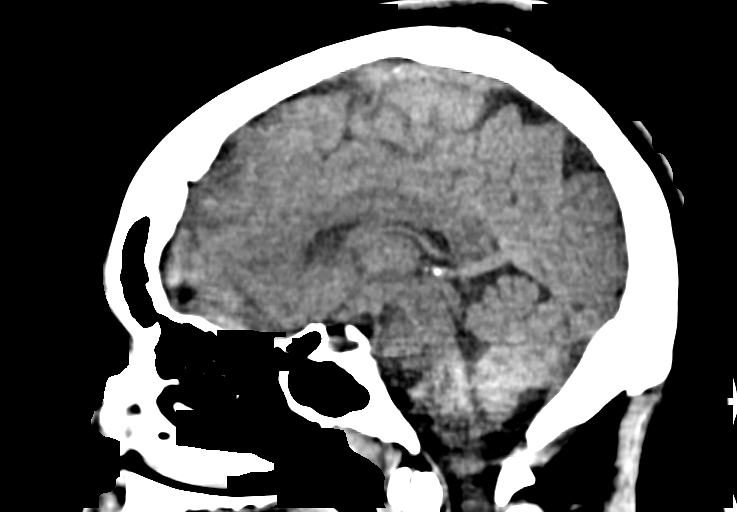
[im 53/80  brain]
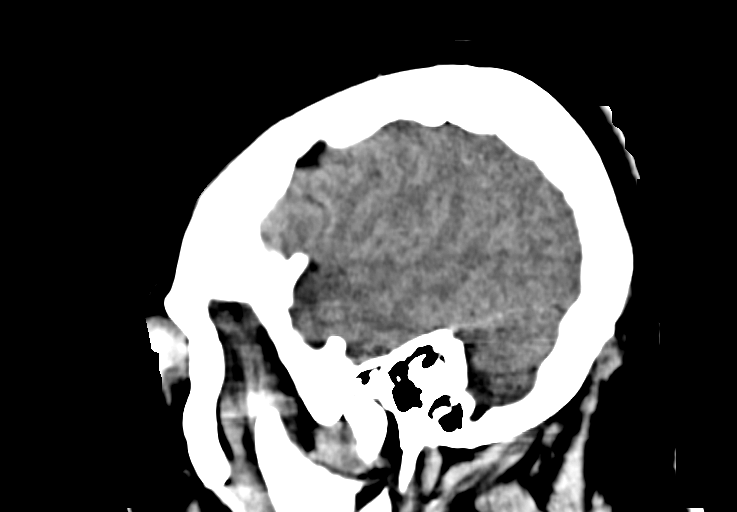

[Series 6: head 3.00 hr40 s3 cor · coronal · 0.33mm/px · 3 of 80 slices shown]
[im 27/80  brain]
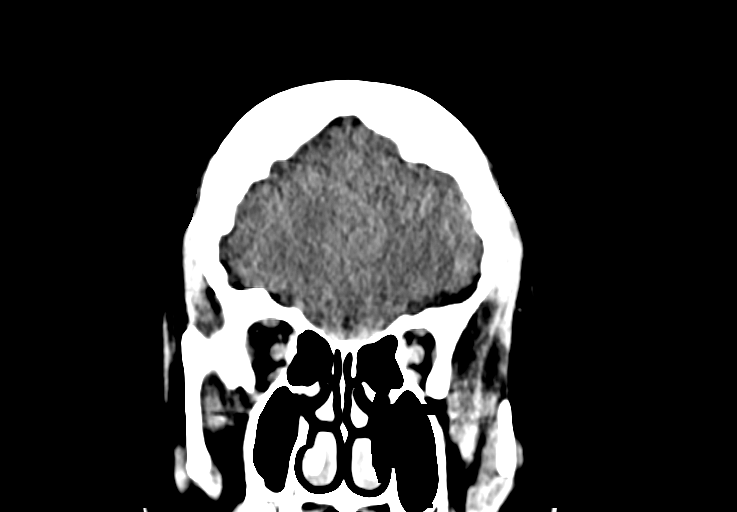
[im 36/80  brain]
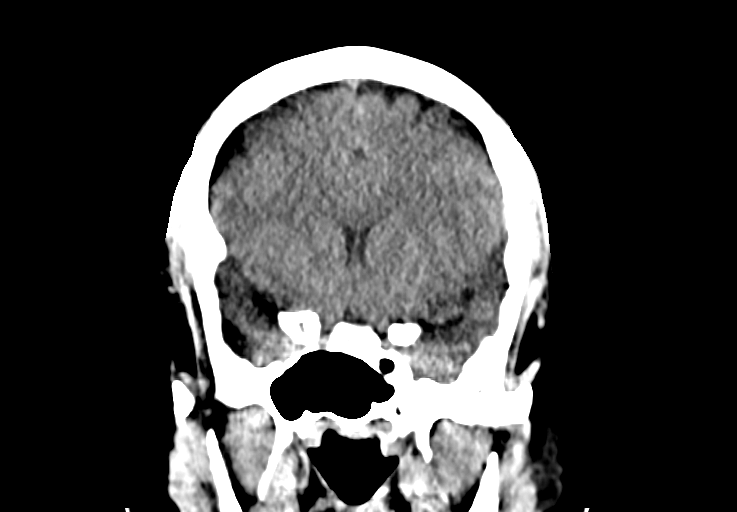
[im 44/80  brain]
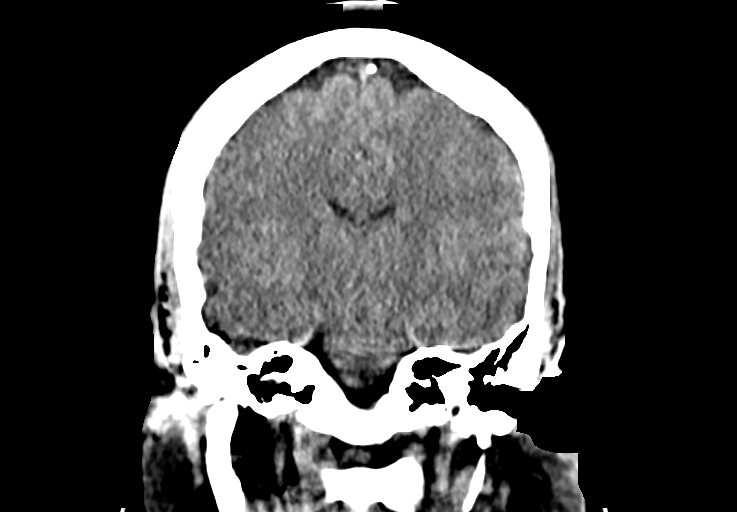

[14 of 47 positions shown; findings below may reference images not displayed]

FINDINGS: Brain: Ventricles are normal in size and configuration. Mild sulcal
prominence is noted in the posterior parietal lobes bilaterally in a
symmetric manner, a finding of questionable significance. There is
no intracranial mass, hemorrhage, extra-axial fluid collection, or
midline shift. Brain parenchyma appears unremarkable. No acute
infarct

Vascular: No hyperdense vessels.  No vascular calcification evident.

Skull: The bony calvarium appears intact.

Sinuses/Orbits: There is mild mucosal thickening in several ethmoid
air cells. There is mild mucosal thickening in the right inferior
maxillary antrum. Other paranasal sinuses are clear. Orbits appear
symmetric bilaterally.

Other: Mastoid air cells are clear.
IMPRESSION: Brain parenchyma appears unremarkable. No acute infarct. No mass or
hemorrhage. Ventricles appear normal. Sulcal prominence in the
posterior parietal lobes bilaterally noted, a finding of
questionable significance.

Areas of mild paranasal sinus disease noted.

## 2019-06-29 ENCOUNTER — Encounter (INDEPENDENT_AMBULATORY_CARE_PROVIDER_SITE_OTHER): Payer: Self-pay | Admitting: Family Medicine

## 2019-06-29 ENCOUNTER — Ambulatory Visit (INDEPENDENT_AMBULATORY_CARE_PROVIDER_SITE_OTHER): Payer: Medicare Other | Admitting: Family Medicine

## 2019-06-29 ENCOUNTER — Other Ambulatory Visit: Payer: Self-pay

## 2019-06-29 VITALS — BP 125/84 | HR 89 | Temp 97.8°F | Ht 71.0 in | Wt 303.0 lb

## 2019-06-29 DIAGNOSIS — E559 Vitamin D deficiency, unspecified: Secondary | ICD-10-CM

## 2019-06-29 DIAGNOSIS — Z6841 Body Mass Index (BMI) 40.0 and over, adult: Secondary | ICD-10-CM

## 2019-06-29 MED ORDER — VITAMIN D (ERGOCALCIFEROL) 1.25 MG (50000 UNIT) PO CAPS: 50000.0000 [IU] | ORAL_CAPSULE | ORAL | 0 refills | Status: DC

## 2019-06-29 NOTE — Progress Notes (Signed)
Office: [phone]  /  Fax: [phone]   HPI:   Chief Complaint: OBESITY Cindy Phillips is here to discuss her progress with her obesity treatment plan. She is on the portion control better and make smarter food choices, such as increase vegetables and decrease simple carbohydrates and is following her eating plan approximately 60-70 % of the time. She states she is walking and doing stretches for 20-30 minutes 7 times per week. Cindy Phillips has been working on decreasing sugar and increasing protein. She has considered trying to be vegetarian, but doesn't know how she will eat enough protein.  Her weight is (!) 303 lb (137.4 kg) today and has had a weight loss of 3 pounds over a period of 3 weeks since her last visit. She has lost 0 lbs since starting treatment with [place].  Vitamin D Deficiency Cindy Phillips has a diagnosis of vitamin D deficiency. She is currently taking prescription Vit D, but not yet at goal. She sometimes forgets to take it. She denies nausea, vomiting or muscle weakness.  ASSESSMENT AND PLAN:  Vitamin D deficiency - Plan: Vitamin D, Ergocalciferol, (DRISDOL) 1.25 MG (50000 UT) CAPS capsule  Class 3 severe obesity with serious comorbidity and body mass index (BMI) of 40.0 to 44.9 in adult, unspecified obesity type ([place])  PLAN:  Vitamin D Deficiency Cindy Phillips was informed that low vitamin D levels contributes to fatigue and are associated with obesity, breast, and colon cancer. Cindy Phillips agrees to continue taking prescription Vit D 50,000 IU every week #4 and we will refill for 1 month. She will follow up for routine testing of vitamin D, at least 2-3 times per year. She was informed of the risk of over-replacement of vitamin D and agrees to not increase her dose unless she discusses this with [place] first. Cindy Phillips agrees to follow up with our clinic in 4 weeks.  Obesity Cindy Phillips is currently in the action stage of change. As such, her goal is to continue with weight loss efforts She has  agreed to portion control better and make smarter food choices, such as increase vegetables and decrease simple carbohydrates   Cindy Phillips was encouraged to increase her vegetables, but reconsider being vegetarian as this will be especially difficult for her. Cindy Phillips has been instructed to work up to a goal of 150 minutes of combined cardio and strengthening exercise per week for weight loss and overall health benefits. We discussed the following Behavioral Modification Strategies today: decreasing simple carbohydrates  and increasing vegetables   Cindy Phillips has agreed to follow up with our clinic in 4 weeks. She was informed of the importance of frequent follow up visits to maximize her success with intensive lifestyle modifications for her multiple health conditions.  ALLERGIES: No Known Allergies  MEDICATIONS: Current Outpatient Medications on File Prior to Visit  Medication Sig Dispense Refill  . atorvastatin (LIPITOR) 10 MG tablet Take 10 mg by mouth daily.    . hydrOXYzine (ATARAX/VISTARIL) 50 MG tablet Take 1 tablet (50 mg total) by mouth every 4 (four) hours as needed for anxiety. 45 tablet 0  . lamoTRIgine (LAMICTAL) 25 MG tablet Take 1 tablet (25 mg total) by mouth daily. For mood stabilization 30 tablet 0  . medroxyPROGESTERone (DEPO-PROVERA) 150 MG/ML injection Inject 1 mL (150 mg total) into the muscle every 3 (three) months. For prevention of pregnancy 1 mL   . metFORMIN (GLUCOPHAGE) 500 MG tablet Take two tabs p o Qam and One in the  PM 90 tablet 0  . OLANZapine zydis (ZYPREXA)  15 MG disintegrating tablet Take 1 tablet (15 mg total) by mouth at bedtime. For mood control 30 tablet 0  . ondansetron (ZOFRAN ODT) 4 MG disintegrating tablet Take 1 tablet (4 mg total) by mouth every 8 (eight) hours as needed. 20 tablet 0  . polyethylene glycol powder (GLYCOLAX/MIRALAX) powder Take 1 Container by mouth once.    . sodium chloride (OCEAN) 0.65 % SOLN nasal spray Place 1 spray into both nostrils  as needed for congestion.  0   No current facility-administered medications on file prior to visit.     PAST MEDICAL HISTORY: Past Medical History:  Diagnosis Date  . Bipolar 1 disorder ([place])   . Bowel incontinence   . Depression   . Diabetes mellitus without complication ([place])   . Mental disability     PAST SURGICAL HISTORY: Past Surgical History:  Procedure Laterality Date  . FOOT SURGERY      SOCIAL HISTORY: Social History   Tobacco Use  . Smoking status: Never Smoker  . Smokeless tobacco: Never Used  Substance Use Topics  . Alcohol use: No  . Drug use: No    FAMILY HISTORY: Family History  Problem Relation Age of Onset  . High blood pressure Mother   . Anxiety disorder Mother   . Diabetes Maternal Grandmother     ROS: Review of Systems  Constitutional: Positive for weight loss.  Gastrointestinal: Negative for nausea and vomiting.  Musculoskeletal:       Negative muscle weakness    PHYSICAL EXAM: Blood pressure 125/84, pulse 89, temperature 97.8 F (36.6 C), temperature source Oral, height 5\' 11"  (1.803 m), weight (!) 303 lb (137.4 kg), SpO2 98 %. Body mass index is 42.26 kg/m. Physical Exam Vitals signs reviewed.  Constitutional:      Appearance: Normal appearance. She is obese.  Cardiovascular:     Rate and Rhythm: Normal rate.     Pulses: Normal pulses.  Pulmonary:     Effort: Pulmonary effort is normal.     Breath sounds: Normal breath sounds.  Musculoskeletal: Normal range of motion.  Skin:    General: Skin is warm and dry.  Neurological:     Mental Status: She is alert and oriented to person, place, and time.  Psychiatric:        Mood and Affect: Mood normal.        Behavior: Behavior normal.     RECENT LABS AND TESTS: BMET    Component Value Date/Time   NA 139 05/13/2019 2010   NA 140 03/02/2019 1323   K 3.5 05/13/2019 2010   CL 106 05/13/2019 2010   CO2 22 05/13/2019 2010   GLUCOSE 99 05/13/2019 2010   BUN 7 05/13/2019  2010   BUN 8 03/02/2019 1323   CREATININE 0.86 05/13/2019 2010   CALCIUM 9.7 05/13/2019 2010   GFRNONAA >60 05/13/2019 2010   GFRAA >60 05/13/2019 2010   Lab Results  Component Value Date   HGBA1C 5.8 (H) 03/02/2019   HGBA1C 5.6 09/08/2018   HGBA1C 5.3 04/23/2018   HGBA1C 5.4 11/26/2017   HGBA1C 5.4 09/11/2014   Lab Results  Component Value Date   INSULIN 48.3 (H) 03/02/2019   INSULIN 31.2 (H) 04/23/2018   INSULIN 44.0 (H) 11/26/2017   CBC    Component Value Date/Time   WBC 5.0 05/13/2019 2010   RBC 4.55 05/13/2019 2010   HGB 13.3 05/13/2019 2010   HGB 13.0 11/26/2017 0859   HCT 40.1 05/13/2019 2010   HCT 38.8  11/26/2017 0859   PLT 201 05/13/2019 2010   MCV 88.1 05/13/2019 2010   MCV 88 11/26/2017 0859   MCH 29.2 05/13/2019 2010   MCHC 33.2 05/13/2019 2010   RDW 13.3 05/13/2019 2010   RDW 13.7 11/26/2017 0859   LYMPHSABS 2.1 05/13/2019 2010   LYMPHSABS 2.1 11/26/2017 0859   MONOABS 0.5 05/13/2019 2010   EOSABS 0.0 05/13/2019 2010   EOSABS 0.0 11/26/2017 0859   BASOSABS 0.0 05/13/2019 2010   BASOSABS 0.0 11/26/2017 0859   Iron/TIBC/Ferritin/ %Sat No results found for: IRON, TIBC, FERRITIN, IRONPCTSAT Lipid Panel     Component Value Date/Time   CHOL 172 09/08/2018   CHOL 164 11/26/2017 0859   TRIG 131 09/08/2018   HDL 59 09/08/2018   HDL 44 11/26/2017 0859   CHOLHDL 4.1 09/11/2014 0630   VLDL 13 09/11/2014 0630   LDLCALC 90 09/08/2018   LDLCALC 97 11/26/2017 0859   Hepatic Function Panel     Component Value Date/Time   PROT 7.3 03/02/2019 1323   ALBUMIN 4.6 03/02/2019 1323   AST 16 03/02/2019 1323   ALT 13 03/02/2019 1323   ALKPHOS 103 03/02/2019 1323   BILITOT 0.4 03/02/2019 1323      Component Value Date/Time   TSH 2.800 11/26/2017 0859   TSH 3.374 09/11/2014 0630      OBESITY BEHAVIORAL INTERVENTION VISIT  Today's visit was # 25   Starting weight: 289 lbs Starting date: 11/26/17 Today's weight : 303 lbs Today's date: 06/29/2019 Total  lbs lost to date: 0 At least 15 minutes were spent on discussing the following behavioral intervention visit.   ASK: We discussed the diagnosis of obesity with Cindy Phillips today and Cindy Phillips agreed to give [place] permission to discuss obesity behavioral modification therapy today.  ASSESS: Cindy Phillips has the diagnosis of obesity and her BMI today is 42.28 Kaydence is in the action stage of change   ADVISE: Cindy Phillips was educated on the multiple health risks of obesity as well as the benefit of weight loss to improve her health. She was advised of the need for long term treatment and the importance of lifestyle modifications to improve her current health and to decrease her risk of future health problems.  AGREE: Multiple dietary modification options and treatment options were discussed and  Cindy Phillips agreed to follow the recommendations documented in the above note.  ARRANGE: Cindy Phillips was educated on the importance of frequent visits to treat obesity as outlined per CMS and USPSTF guidelines and agreed to schedule her next follow up appointment today.  I, Trixie Dredge, am acting as transcriptionist for Dennard Nip, MD I have reviewed the above documentation for accuracy and completeness, and I agree with the above. -Dennard Nip, MD

## 2019-06-30 DIAGNOSIS — R519 Headache, unspecified: Secondary | ICD-10-CM | POA: Diagnosis not present

## 2019-06-30 DIAGNOSIS — E1169 Type 2 diabetes mellitus with other specified complication: Secondary | ICD-10-CM | POA: Diagnosis not present

## 2019-07-20 DIAGNOSIS — Z049 Encounter for examination and observation for unspecified reason: Secondary | ICD-10-CM | POA: Diagnosis not present

## 2019-07-20 DIAGNOSIS — G43719 Chronic migraine without aura, intractable, without status migrainosus: Secondary | ICD-10-CM | POA: Diagnosis not present

## 2019-07-22 DIAGNOSIS — G43719 Chronic migraine without aura, intractable, without status migrainosus: Secondary | ICD-10-CM | POA: Diagnosis not present

## 2019-07-22 DIAGNOSIS — M791 Myalgia, unspecified site: Secondary | ICD-10-CM | POA: Diagnosis not present

## 2019-07-22 DIAGNOSIS — M542 Cervicalgia: Secondary | ICD-10-CM | POA: Diagnosis not present

## 2019-07-23 ENCOUNTER — Other Ambulatory Visit (INDEPENDENT_AMBULATORY_CARE_PROVIDER_SITE_OTHER): Payer: Self-pay | Admitting: Family Medicine

## 2019-07-23 DIAGNOSIS — E559 Vitamin D deficiency, unspecified: Secondary | ICD-10-CM

## 2019-07-27 ENCOUNTER — Encounter (INDEPENDENT_AMBULATORY_CARE_PROVIDER_SITE_OTHER): Payer: Self-pay | Admitting: Family Medicine

## 2019-07-27 ENCOUNTER — Ambulatory Visit (INDEPENDENT_AMBULATORY_CARE_PROVIDER_SITE_OTHER): Payer: Medicare Other | Admitting: Family Medicine

## 2019-07-27 ENCOUNTER — Other Ambulatory Visit: Payer: Self-pay

## 2019-07-27 VITALS — BP 111/65 | HR 96 | Temp 98.3°F | Ht 71.0 in | Wt 299.0 lb

## 2019-07-27 DIAGNOSIS — Z9189 Other specified personal risk factors, not elsewhere classified: Secondary | ICD-10-CM | POA: Diagnosis not present

## 2019-07-27 DIAGNOSIS — G4733 Obstructive sleep apnea (adult) (pediatric): Secondary | ICD-10-CM | POA: Diagnosis not present

## 2019-07-27 DIAGNOSIS — E559 Vitamin D deficiency, unspecified: Secondary | ICD-10-CM | POA: Diagnosis not present

## 2019-07-27 DIAGNOSIS — E7849 Other hyperlipidemia: Secondary | ICD-10-CM | POA: Diagnosis not present

## 2019-07-27 DIAGNOSIS — E119 Type 2 diabetes mellitus without complications: Secondary | ICD-10-CM | POA: Diagnosis not present

## 2019-07-27 DIAGNOSIS — E66813 Obesity, class 3: Secondary | ICD-10-CM

## 2019-07-27 DIAGNOSIS — Z6841 Body Mass Index (BMI) 40.0 and over, adult: Secondary | ICD-10-CM

## 2019-07-27 MED ORDER — VITAMIN D (ERGOCALCIFEROL) 1.25 MG (50000 UNIT) PO CAPS: 50000.0000 [IU] | ORAL_CAPSULE | ORAL | 0 refills | Status: DC

## 2019-07-28 LAB — LIPID PANEL WITH LDL/HDL RATIO
Cholesterol, Total: 201 mg/dL — ABNORMAL HIGH (ref 100–199)
HDL: 44 mg/dL (ref >39)
LDL Chol Calc (NIH): 120 mg/dL — ABNORMAL HIGH (ref 0–99)
LDL/HDL Ratio: 2.7 ratio (ref 0.0–3.2)
Triglycerides: 210 mg/dL — ABNORMAL HIGH (ref 0–149)
VLDL Cholesterol Cal: 37 mg/dL (ref 5–40)

## 2019-07-28 LAB — CBC WITH DIFFERENTIAL/PLATELET
Basophils Absolute: 0 10*3/uL (ref 0.0–0.2)
Basos: 0 %
EOS (ABSOLUTE): 0 10*3/uL (ref 0.0–0.4)
Eos: 0 %
Hematocrit: 42.4 % (ref 34.0–46.6)
Hemoglobin: 13.9 g/dL (ref 11.1–15.9)
Immature Grans (Abs): 0 10*3/uL (ref 0.0–0.1)
Immature Granulocytes: 0 %
Lymphocytes Absolute: 2.4 10*3/uL (ref 0.7–3.1)
Lymphs: 31 %
MCH: 28.9 pg (ref 26.6–33.0)
MCHC: 32.8 g/dL (ref 31.5–35.7)
MCV: 88 fL (ref 79–97)
Monocytes Absolute: 0.5 10*3/uL (ref 0.1–0.9)
Monocytes: 7 %
Neutrophils Absolute: 4.7 10*3/uL (ref 1.4–7.0)
Neutrophils: 62 %
Platelets: 227 10*3/uL (ref 150–450)
RBC: 4.81 x10E6/uL (ref 3.77–5.28)
RDW: 12.7 % (ref 11.7–15.4)
WBC: 7.6 10*3/uL (ref 3.4–10.8)

## 2019-07-28 LAB — VITAMIN D 25 HYDROXY (VIT D DEFICIENCY, FRACTURES): Vit D, 25-Hydroxy: 30.7 ng/mL (ref 30.0–100.0)

## 2019-07-28 LAB — COMPREHENSIVE METABOLIC PANEL
ALT: 10 IU/L (ref 0–32)
AST: 12 IU/L (ref 0–40)
Albumin/Globulin Ratio: 1.7 (ref 1.2–2.2)
Albumin: 5 g/dL (ref 3.9–5.0)
Alkaline Phosphatase: 132 IU/L — ABNORMAL HIGH (ref 39–117)
BUN/Creatinine Ratio: 8 — ABNORMAL LOW (ref 9–23)
BUN: 7 mg/dL (ref 6–20)
Bilirubin Total: 0.6 mg/dL (ref 0.0–1.2)
CO2: 20 mmol/L (ref 20–29)
Calcium: 10.2 mg/dL (ref 8.7–10.2)
Chloride: 103 mmol/L (ref 96–106)
Creatinine, Ser: 0.86 mg/dL (ref 0.57–1.00)
GFR calc Af Amer: 106 mL/min/{1.73_m2} (ref >59)
GFR calc non Af Amer: 92 mL/min/{1.73_m2} (ref >59)
Globulin, Total: 3 g/dL (ref 1.5–4.5)
Glucose: 90 mg/dL (ref 65–99)
Potassium: 4.4 mmol/L (ref 3.5–5.2)
Sodium: 142 mmol/L (ref 134–144)
Total Protein: 8 g/dL (ref 6.0–8.5)

## 2019-07-28 LAB — INSULIN, RANDOM: INSULIN: 56.4 u[IU]/mL — ABNORMAL HIGH (ref 2.6–24.9)

## 2019-07-28 LAB — HEMOGLOBIN A1C
Est. average glucose Bld gHb Est-mCnc: 114 mg/dL
Hgb A1c MFr Bld: 5.6 % (ref 4.8–5.6)

## 2019-07-28 NOTE — Progress Notes (Signed)
Office: [phone]  /  Fax: [phone]   HPI:   Chief Complaint: OBESITY Cindy Phillips is here to discuss her progress with her obesity treatment plan. She is on the portion control better and make smarter food choices, such as increase vegetables and decrease simple carbohydrates and is following her eating plan approximately 60 % of the time. She states she is walking for 10-20 minutes 7 times per week. Cindy Phillips is adhering to her diet fairly well. She would like more options.  Her weight is 299 lb (135.6 kg) today and has had a weight loss of 4 pounds over a period of 4 weeks since her last visit. She has lost 0 lbs since starting treatment with [place].  Vitamin D Deficiency Cindy Phillips has a diagnosis of vitamin D deficiency. She is currently taking prescription Vit D and denies nausea, vomiting or muscle weakness.  At risk for osteopenia and osteoporosis Cindy Phillips is at higher risk of osteopenia and osteoporosis due to vitamin D deficiency.   Diabetes II Cindy Phillips has a diagnosis of diabetes type II. Cindy Phillips is taking metformin currently. She has been working on intensive lifestyle modifications including diet, exercise, and weight loss to help control her blood glucose levels.  Obstructive Sleep Apnea Cindy Phillips has a diagnosis of obstructive sleep apnea. She is noncompliant with CPAP.  Hyperlipidemia Cindy Phillips has hyperlipidemia and has been trying to improve her cholesterol levels with intensive lifestyle modification including a low saturated fat diet, exercise and weight loss. She denies any chest pain, claudication or myalgias.  ASSESSMENT AND PLAN:  Vitamin D deficiency - Plan: CBC with Differential/Platelet, Vitamin D (25 hydroxy), Vitamin D, Ergocalciferol, (DRISDOL) 1.25 MG (50000 UT) CAPS capsule  Type 2 diabetes mellitus without complication, without long-term current use of insulin ([facility]) - Plan: Comprehensive metabolic panel, CBC with Differential/Platelet, Hemoglobin A1c, Insulin,  random  OSA (obstructive sleep apnea)  Other hyperlipidemia - Plan: CBC with Differential/Platelet, Lipid Panel With LDL/HDL Ratio  At risk for osteoporosis  Class 3 severe obesity with serious comorbidity and body mass index (BMI) of 40.0 to 44.9 in adult, unspecified obesity type ([facility])  PLAN:  Vitamin D Deficiency Low vitamin D level contributes to fatigue and are associated with obesity, breast, and colon cancer. Cindy Phillips agrees to continue taking prescription Vit D 50,000 IU every week #4 and we will refill for 1 month. She will follow up for routine testing of vitamin D, at least 2-3 times per year to avoid over-replacement. We will check labs today. Cindy Phillips agrees to follow up with our clinic in 2 weeks.  At risk for osteopenia and osteoporosis Cindy Phillips was given extended (15 minutes) osteoporosis prevention counseling today. Cindy Phillips is at risk for osteopenia and osteoporsis due to her vitamin D deficiency. She was encouraged to take her vitamin D and follow her higher calcium diet and increase strengthening exercise to help strengthen her bones and decrease her risk of osteopenia and osteoporosis.  Diabetes II Cindy Phillips has been given diabetes education by myself today. Good blood sugar control is important to decrease the likelihood of diabetic complications such as nephropathy, neuropathy, limb loss, blindness, coronary artery disease, and death. Intensive lifestyle modification including diet, exercise and weight loss were discussed as the first line treatment for diabetes. Brenetta agrees to continue taking metformin, and we will check labs today. Cindy Phillips agrees to follow up with our clinic in 2 weeks.  Obstructive Sleep Apnea Cindy Phillips will work on weight loss efforts, and we will continue to monitor.  Hyperlipidemia Intensive lifestyle modifications  as the first line treatment for hyperlipidemia. We discussed many lifestyle modifications today and Cindy Phillips will continue to work on  diet, exercise and weight loss efforts. We will recheck labs today.  Obesity Cindy Phillips is currently in the action stage of change. As such, her goal is to continue with weight loss efforts She has agreed to portion control better and make smarter food choices, such as increase vegetables and decrease simple carbohydrates  Cindy Phillips has been instructed to work up to a goal of 150 minutes of combined cardio and strengthening exercise per week for weight loss and overall health benefits. We discussed the following Behavioral Modification Strategies today: increasing lean protein intake, decreasing simple carbohydrates, increasing vegetables, increase H20 intake, work on meal planning and easy cooking plans, holiday eating strategies  and decrease liquid calories   Cindy Phillips has agreed to follow up with our clinic in 2 weeks. She was informed of the importance of frequent follow up visits to maximize her success with intensive lifestyle modifications for her multiple health conditions.  ALLERGIES: No Known Allergies  MEDICATIONS: Current Outpatient Medications on File Prior to Visit  Medication Sig Dispense Refill  . atorvastatin (LIPITOR) 10 MG tablet Take 10 mg by mouth daily.    . hydrOXYzine (ATARAX/VISTARIL) 50 MG tablet Take 1 tablet (50 mg total) by mouth every 4 (four) hours as needed for anxiety. 45 tablet 0  . lamoTRIgine (LAMICTAL) 25 MG tablet Take 1 tablet (25 mg total) by mouth daily. For mood stabilization 30 tablet 0  . medroxyPROGESTERone (DEPO-PROVERA) 150 MG/ML injection Inject 1 mL (150 mg total) into the muscle every 3 (three) months. For prevention of pregnancy 1 mL   . metFORMIN (GLUCOPHAGE) 500 MG tablet Take two tabs p o Qam and One in the  PM 90 tablet 0  . OLANZapine zydis (ZYPREXA) 15 MG disintegrating tablet Take 1 tablet (15 mg total) by mouth at bedtime. For mood control 30 tablet 0  . ondansetron (ZOFRAN ODT) 4 MG disintegrating tablet Take 1 tablet (4 mg total) by mouth  every 8 (eight) hours as needed. 20 tablet 0  . polyethylene glycol powder (GLYCOLAX/MIRALAX) powder Take 1 Container by mouth once.    . sodium chloride (OCEAN) 0.65 % SOLN nasal spray Place 1 spray into both nostrils as needed for congestion.  0   No current facility-administered medications on file prior to visit.     PAST MEDICAL HISTORY: Past Medical History:  Diagnosis Date  . Bipolar 1 disorder ([place])   . Bowel incontinence   . Depression   . Diabetes mellitus without complication ([place])   . Mental disability    PAST SURGICAL HISTORY: Past Surgical History:  Procedure Laterality Date  . FOOT SURGERY     SOCIAL HISTORY: Social History   Tobacco Use  . Smoking status: Never Smoker  . Smokeless tobacco: Never Used  Substance Use Topics  . Alcohol use: No  . Drug use: No    FAMILY HISTORY: Family History  Problem Relation Age of Onset  . High blood pressure Mother   . Anxiety disorder Mother   . Diabetes Maternal Grandmother    ROS: Review of Systems  Constitutional: Positive for weight loss.  Cardiovascular: Negative for chest pain and claudication.  Gastrointestinal: Negative for nausea and vomiting.  Musculoskeletal: Negative for myalgias.       Negative muscle weakness   PHYSICAL EXAM: Blood pressure 111/65, pulse 96, temperature 98.3 F (36.8 C), temperature source Oral, height 5\' 11"  (1.803 m),  weight 299 lb (135.6 kg), SpO2 97 %. Body mass index is 41.7 kg/m.   Physical Exam Vitals signs reviewed.  Constitutional:      Appearance: Normal appearance. She is obese.  Cardiovascular:     Rate and Rhythm: Normal rate.     Pulses: Normal pulses.  Pulmonary:     Effort: Pulmonary effort is normal.     Breath sounds: Normal breath sounds.  Musculoskeletal: Normal range of motion.  Skin:    General: Skin is warm and dry.  Neurological:     Mental Status: She is alert and oriented to person, place, and time.  Psychiatric:        Mood and Affect: Mood  normal.        Behavior: Behavior normal.    RECENT LABS AND TESTS:    Component Value Date/Time   NA 142 07/27/2019 1309   K 4.4 07/27/2019 1309   CL 103 07/27/2019 1309   CO2 20 07/27/2019 1309   GLUCOSE 90 07/27/2019 1309   GLUCOSE 99 05/13/2019 2010   BUN 7 07/27/2019 1309   CREATININE 0.86 07/27/2019 1309   CALCIUM 10.2 07/27/2019 1309   GFRNONAA 92 07/27/2019 1309   GFRAA 106 07/27/2019 1309   Lab Results  Component Value Date   HGBA1C 5.6 07/27/2019   HGBA1C 5.8 (H) 03/02/2019   HGBA1C 5.6 09/08/2018   HGBA1C 5.3 04/23/2018   HGBA1C 5.4 11/26/2017   Lab Results  Component Value Date   INSULIN 56.4 (H) 07/27/2019   INSULIN 48.3 (H) 03/02/2019   INSULIN 31.2 (H) 04/23/2018   INSULIN 44.0 (H) 11/26/2017   CBC    Component Value Date/Time   WBC 7.6 07/27/2019 1309   WBC 5.0 05/13/2019 2010   RBC 4.81 07/27/2019 1309   RBC 4.55 05/13/2019 2010   HGB 13.9 07/27/2019 1309   HCT 42.4 07/27/2019 1309   PLT 227 07/27/2019 1309   MCV 88 07/27/2019 1309   MCH 28.9 07/27/2019 1309   MCH 29.2 05/13/2019 2010   MCHC 32.8 07/27/2019 1309   MCHC 33.2 05/13/2019 2010   RDW 12.7 07/27/2019 1309   LYMPHSABS 2.4 07/27/2019 1309   MONOABS 0.5 05/13/2019 2010   EOSABS 0.0 07/27/2019 1309   BASOSABS 0.0 07/27/2019 1309   Lipid Panel     Component Value Date/Time   CHOL 201 (H) 07/27/2019 1309   TRIG 210 (H) 07/27/2019 1309   HDL 44 07/27/2019 1309   CHOLHDL 4.1 09/11/2014 0630   VLDL 13 09/11/2014 0630   LDLCALC 120 (H) 07/27/2019 1309   Hepatic Function Panel     Component Value Date/Time   PROT 8.0 07/27/2019 1309   ALBUMIN 5.0 07/27/2019 1309   AST 12 07/27/2019 1309   ALT 10 07/27/2019 1309   ALKPHOS 132 (H) 07/27/2019 1309   BILITOT 0.6 07/27/2019 1309      Component Value Date/Time   TSH 2.800 11/26/2017 0859   TSH 3.374 09/11/2014 0630    OBESITY BEHAVIORAL INTERVENTION VISIT  Today's visit was # 26   Starting weight: 289 lbs Starting date:  11/26/17 Today's weight : 299 lbs Today's date: 07/27/2019 Total lbs lost to date: 0    ASK: We discussed the diagnosis of obesity with Cindy Phillips today and Cindy Phillips agreed to give [place] permission to discuss obesity behavioral modification therapy today.  ASSESS: Cindy Phillips has the diagnosis of obesity and her BMI today is 41.72 Cindy Phillips is in the action stage of change   ADVISE: Cindy Phillips was  educated on the multiple health risks of obesity as well as the benefit of weight loss to improve her health. She was advised of the need for long term treatment and the importance of lifestyle modifications to improve her current health and to decrease her risk of future health problems.  AGREE: Multiple dietary modification options and treatment options were discussed and  Cindy Phillips agreed to follow the recommendations documented in the above note.  ARRANGE: Lealani was educated on the importance of frequent visits to treat obesity as outlined per CMS and USPSTF guidelines and agreed to schedule her next follow up appointment today.  Wilhemena Durie, am acting as transcriptionist for Briscoe Deutscher, DO  I have reviewed the above documentation for accuracy and completeness, and I agree with the above. Briscoe Deutscher, DO

## 2019-08-03 DIAGNOSIS — E1169 Type 2 diabetes mellitus with other specified complication: Secondary | ICD-10-CM | POA: Diagnosis not present

## 2019-08-03 DIAGNOSIS — Z7189 Other specified counseling: Secondary | ICD-10-CM | POA: Diagnosis not present

## 2019-08-03 DIAGNOSIS — R519 Headache, unspecified: Secondary | ICD-10-CM | POA: Diagnosis not present

## 2019-08-10 ENCOUNTER — Ambulatory Visit (INDEPENDENT_AMBULATORY_CARE_PROVIDER_SITE_OTHER): Payer: Medicare Other | Admitting: Family Medicine

## 2019-08-10 ENCOUNTER — Encounter (INDEPENDENT_AMBULATORY_CARE_PROVIDER_SITE_OTHER): Payer: Self-pay

## 2019-08-12 ENCOUNTER — Other Ambulatory Visit: Payer: Self-pay

## 2019-08-12 ENCOUNTER — Ambulatory Visit (INDEPENDENT_AMBULATORY_CARE_PROVIDER_SITE_OTHER): Payer: Medicare Other | Admitting: Family Medicine

## 2019-08-12 VITALS — BP 134/86 | HR 98 | Temp 97.5°F | Ht 71.0 in | Wt 297.0 lb

## 2019-08-12 DIAGNOSIS — G4733 Obstructive sleep apnea (adult) (pediatric): Secondary | ICD-10-CM

## 2019-08-12 DIAGNOSIS — E559 Vitamin D deficiency, unspecified: Secondary | ICD-10-CM | POA: Diagnosis not present

## 2019-08-12 DIAGNOSIS — E7849 Other hyperlipidemia: Secondary | ICD-10-CM | POA: Diagnosis not present

## 2019-08-12 DIAGNOSIS — E119 Type 2 diabetes mellitus without complications: Secondary | ICD-10-CM

## 2019-08-12 DIAGNOSIS — Z6841 Body Mass Index (BMI) 40.0 and over, adult: Secondary | ICD-10-CM

## 2019-08-18 ENCOUNTER — Encounter (INDEPENDENT_AMBULATORY_CARE_PROVIDER_SITE_OTHER): Payer: Self-pay | Admitting: Family Medicine

## 2019-08-18 NOTE — Progress Notes (Signed)
Chief Complaint: Cindy Phillips is here to discuss her progress with her obesity treatment plan. Cindy Phillips is on the Category 1 plan and states she is following her eating plan approximately 60 % of the time. Cindy Phillips states she is walking for 30 minutes 7 times per week.  Today's visit was # 27  Starting weight: 289 lbs Starting date: 11/26/17 Today's weight : 297 lbs Today's date: 08/12/2019 Total lbs lost to date: 0 Total lbs lost since last in-office visit: 2  Subjective:   Interim History: Cindy Phillips is planning to eat a lot of Christmas meals.  1. Vitamin D deficiency Cindy Phillips is taking prescription Vit D 50,000 IU weekly.  2. Type 2 diabetes mellitus without complication, without long-term current use of insulin ([place]) Cindy Phillips is taking metformin 500 mg BID.  Lab Results  Component Value Date   HGBA1C 5.6 07/27/2019   3. OSA (obstructive sleep apnea) Cindy Phillips has obstructive sleep apnea and she is not compliant with her CPAP.  4. Other hyperlipidemia Cindy Phillips is taking Lipitor 10 mg daily.  Lab Results  Component Value Date   CHOL 201 (H) 07/27/2019   HDL 44 07/27/2019   LDLCALC 120 (H) 07/27/2019   TRIG 210 (H) 07/27/2019   CHOLHDL 4.1 09/11/2014   Lab Results  Component Value Date   ALT 10 07/27/2019   AST 12 07/27/2019   ALKPHOS 132 (H) 07/27/2019   BILITOT 0.6 07/27/2019   Reviewed and updated this visit by clinician and staff: allergies, medications, problem list, medical history, surgical history, family history, social history and previous encounter notes.  General review of systems is unchanged or negative, with the exception of new weight loss.  Assessment:   1. Vitamin D deficiency   2. Type 2 diabetes mellitus without complication, without long-term current use of insulin ([place])   3. OSA (obstructive sleep apnea)   4. Other hyperlipidemia   5. Class 3 severe obesity with serious comorbidity and body mass index (BMI)  of 40.0 to 44.9 in adult, unspecified obesity type ([place])    Plan:   1. Vitamin D deficiency Low Vitamin D level contributes to fatigue and are associated with obesity, breast, and colon cancer. Cindy Phillips agrees to continue taking prescription Vitamin D 50,000 IU every week and will follow-up for routine testing of vitamin D, at least 2-3 times per year to avoid over-replacement. We will continue to monitor.  2. Type 2 diabetes mellitus without complication, without long-term current use of insulin ([place]) Cindy Phillips has been given diabetes education by myself today. Good blood sugar control is important to decrease the likelihood of diabetic complications such as nephropathy, neuropathy, limb loss, blindness, coronary artery disease, and death. Intensive lifestyle modification including diet, exercise and weight loss were discussed as the first line treatment for diabetes.  We will continue to monitor.  3. OSA (obstructive sleep apnea) We will continue to monitor.  4. Other hyperlipidemia Intensive lifestyle modifications as the first line treatment for hyperlipidemia. We discussed many lifestyle modifications today and [name] will continue to work on diet, exercise and weight loss efforts. We will continue to monitor.  5. Class 3 severe obesity with serious comorbidity and body mass index (BMI) of 40.0 to 44.9 in adult, unspecified obesity type ([facility]) Cindy Phillips is currently in the action stage of change. As such, her goal is to continue with weight loss efforts. She has agreed to follow the Category 1 plan. The exercise goal is to  eventually work up to 150 minutes of combined cardio and strengthening exercise per week for weight loss and overall health benefits. We discussed the following Behavioral Modification Strategies today: increasing lean protein intake and increasing water intake. Cindy Phillips is to increase her water intake and increase walking.  Cindy Phillips has agreed to follow-up  with our clinic in 3 weeks. She was informed of the importance of frequent follow-up visits to maximize her success with intensive lifestyle modifications for her multiple health conditions.  Objective:   Blood pressure 134/86, pulse 98, temperature (!) 97.5 F (36.4 C), temperature source Oral, height 5\' 11"  (1.803 m), weight 297 lb (134.7 kg), SpO2 98 %. Body mass index is 41.42 kg/m.  General: Cooperative, alert, well developed, in no acute distress. HEENT: Conjunctivae and lids unremarkable. Neck: No thyromegaly.  Cardiovascular: Regular rhythm.  Lungs: Normal work of breathing. Extremities: No edema.  Neurologic: No focal deficits.   Lab Results  Component Value Date   CREATININE 0.86 07/27/2019   BUN 7 07/27/2019   NA 142 07/27/2019   K 4.4 07/27/2019   CL 103 07/27/2019   CO2 20 07/27/2019   Lab Results  Component Value Date   ALT 10 07/27/2019   AST 12 07/27/2019   ALKPHOS 132 (H) 07/27/2019   BILITOT 0.6 07/27/2019   Lab Results  Component Value Date   HGBA1C 5.6 07/27/2019   HGBA1C 5.8 (H) 03/02/2019   HGBA1C 5.6 09/08/2018   HGBA1C 5.3 04/23/2018   HGBA1C 5.4 11/26/2017   Lab Results  Component Value Date   INSULIN 56.4 (H) 07/27/2019   INSULIN 48.3 (H) 03/02/2019   INSULIN 31.2 (H) 04/23/2018   INSULIN 44.0 (H) 11/26/2017   Lab Results  Component Value Date   TSH 2.800 11/26/2017   Lab Results  Component Value Date   CHOL 201 (H) 07/27/2019   HDL 44 07/27/2019   LDLCALC 120 (H) 07/27/2019   TRIG 210 (H) 07/27/2019   CHOLHDL 4.1 09/11/2014   Lab Results  Component Value Date   WBC 7.6 07/27/2019   HGB 13.9 07/27/2019   HCT 42.4 07/27/2019   MCV 88 07/27/2019   PLT 227 07/27/2019   No results found for: IRON, TIBC, FERRITIN   Obesity Behavioral Intervention Visit Documentation for Insurance (15 Minutes):   ASK: We discussed the diagnosis of obesity with Cindy Phillips today and Jemia agreed to give [place] permission to discuss  obesity behavioral modification therapy today.  ASSESS: Sue-Anne has the diagnosis of obesity and her BMI today is 41.44. Demetriss is in the action stage of change.   ADVISE: Jacelynn was educated on the multiple health risks of obesity as well as the benefit of weight loss to improve her health. She was advised of the need for long term treatment and the importance of lifestyle modifications to improve her current health and to decrease her risk of future health problems.  AGREE: Multiple dietary modification options and treatment options were discussed and  Ellanora agreed to follow the recommendations documented in the above note.  ARRANGE: Fatou was educated on the importance of frequent visits to treat obesity as outlined per CMS and USPSTF guidelines and agreed to schedule her next follow up appointment today.  Attestation Statements:   Wilhemena Durie, am acting as transcriptionist for [employer], DO.  I have reviewed the above documentation for accuracy and completeness, and I agree with the above. Briscoe Deutscher, DO

## 2019-08-28 DIAGNOSIS — M542 Cervicalgia: Secondary | ICD-10-CM | POA: Diagnosis not present

## 2019-08-28 DIAGNOSIS — M791 Myalgia, unspecified site: Secondary | ICD-10-CM | POA: Diagnosis not present

## 2019-08-28 DIAGNOSIS — G43719 Chronic migraine without aura, intractable, without status migrainosus: Secondary | ICD-10-CM | POA: Diagnosis not present

## 2019-09-07 ENCOUNTER — Ambulatory Visit (INDEPENDENT_AMBULATORY_CARE_PROVIDER_SITE_OTHER): Payer: Medicare Other | Admitting: Family Medicine

## 2019-09-07 ENCOUNTER — Encounter (INDEPENDENT_AMBULATORY_CARE_PROVIDER_SITE_OTHER): Payer: Self-pay | Admitting: Family Medicine

## 2019-09-07 ENCOUNTER — Other Ambulatory Visit: Payer: Self-pay

## 2019-09-07 VITALS — BP 121/83 | HR 99 | Temp 98.3°F | Ht 71.0 in | Wt 301.0 lb

## 2019-09-07 DIAGNOSIS — Z6841 Body Mass Index (BMI) 40.0 and over, adult: Secondary | ICD-10-CM | POA: Diagnosis not present

## 2019-09-07 DIAGNOSIS — R7303 Prediabetes: Secondary | ICD-10-CM

## 2019-09-08 NOTE — Progress Notes (Signed)
Chief Complaint:   OBESITY Cindy Phillips is here to discuss her progress with her obesity treatment plan along with follow-up of her obesity related diagnoses. Cindy Phillips is on the Category 1 Plan and states she is following her eating plan approximately 30% of the time. Cindy Phillips states she is using resistance bands and walking for 10-20 minutes 3 times per week.  Today's visit was #: 28 Starting weight: 289 lbs Starting date: 11/26/17 Today's weight: 301 lbs Today's date: 09/07/2019 Total lbs lost to date: 0 Total lbs lost since last in-office visit: 0  Interim History: Cindy Phillips did some extra holiday eating and hasn't done well following her plan. She hasn't been meal planning as well and she continues to gain weight. She states she is ready to try a structured plan again, but she is not looking forward to it.  Subjective:   1. Pre-diabetes Cindy Phillips is still forgetting to take her second dose of metformin frequently, and she is still struggling with polyphagia. She denies GI upset.  Assessment/Plan:   1. Pre-diabetes Cindy Phillips will continue to work on weight loss, exercise, and decreasing simple carbohydrates to help decrease the risk of diabetes. Cindy Phillips agrees to changes metformin to 2 tablets PO q AM (no refill needed), and she will follow up in 3 weeks.  2. Class 3 severe obesity with serious comorbidity and body mass index (BMI) of 40.0 to 44.9 in adult, unspecified obesity type ([facility]) Cindy Phillips is currently in the action stage of change. As such, her goal is to continue with weight loss efforts. She has agreed to the Category 2 Plan.   Exercise goals: Cindy Phillips is to continue her current exercise regimen as is, but increase to 30 minutes.  Behavioral modification strategies: increasing lean protein intake, decreasing simple carbohydrates, increasing vegetables and meal planning and cooking strategies.  Cindy Phillips has agreed to follow-up with our clinic in 3 weeks. She was informed of the  importance of frequent follow-up visits to maximize her success with intensive lifestyle modifications for her multiple health conditions.   Objective:   Blood pressure 121/83, pulse 99, temperature 98.3 F (36.8 C), temperature source Oral, height 5\' 11"  (1.803 m), weight (!) 301 lb (136.5 kg), SpO2 98 %. Body mass index is 41.98 kg/m.  General: Cooperative, alert, well developed, in no acute distress. HEENT: Conjunctivae and lids unremarkable. Cardiovascular: Regular rhythm.  Lungs: Normal work of breathing. Neurologic: No focal deficits.   Lab Results  Component Value Date   CREATININE 0.86 07/27/2019   BUN 7 07/27/2019   NA 142 07/27/2019   K 4.4 07/27/2019   CL 103 07/27/2019   CO2 20 07/27/2019   Lab Results  Component Value Date   ALT 10 07/27/2019   AST 12 07/27/2019   ALKPHOS 132 (H) 07/27/2019   BILITOT 0.6 07/27/2019   Lab Results  Component Value Date   HGBA1C 5.6 07/27/2019   HGBA1C 5.8 (H) 03/02/2019   HGBA1C 5.6 09/08/2018   HGBA1C 5.3 04/23/2018   HGBA1C 5.4 11/26/2017   Lab Results  Component Value Date   INSULIN 56.4 (H) 07/27/2019   INSULIN 48.3 (H) 03/02/2019   INSULIN 31.2 (H) 04/23/2018   INSULIN 44.0 (H) 11/26/2017   Lab Results  Component Value Date   TSH 2.800 11/26/2017   Lab Results  Component Value Date   CHOL 201 (H) 07/27/2019   HDL 44 07/27/2019   LDLCALC 120 (H) 07/27/2019   TRIG 210 (H) 07/27/2019   CHOLHDL 4.1 09/11/2014   Lab  Results  Component Value Date   WBC 7.6 07/27/2019   HGB 13.9 07/27/2019   HCT 42.4 07/27/2019   MCV 88 07/27/2019   PLT 227 07/27/2019   No results found for: IRON, TIBC, FERRITIN  Attestation Statements:   Reviewed by clinician on day of visit: allergies, medications, problem list, medical history, surgical history, family history, social history, and previous encounter notes.  Time spent on visit including pre-visit chart review and post-visit care was 21 minutes.   I, Trixie Dredge,  am acting as Location manager for Dennard Nip, MD.  I have reviewed the above documentation for accuracy and completeness, and I agree with the above. -  Dennard Nip, MD

## 2019-09-09 ENCOUNTER — Encounter (INDEPENDENT_AMBULATORY_CARE_PROVIDER_SITE_OTHER): Payer: Self-pay | Admitting: Family Medicine

## 2019-09-14 DIAGNOSIS — E1169 Type 2 diabetes mellitus with other specified complication: Secondary | ICD-10-CM | POA: Diagnosis not present

## 2019-09-14 DIAGNOSIS — Z7189 Other specified counseling: Secondary | ICD-10-CM | POA: Diagnosis not present

## 2019-09-14 DIAGNOSIS — G43719 Chronic migraine without aura, intractable, without status migrainosus: Secondary | ICD-10-CM | POA: Diagnosis not present

## 2019-09-14 DIAGNOSIS — M791 Myalgia, unspecified site: Secondary | ICD-10-CM | POA: Diagnosis not present

## 2019-09-14 DIAGNOSIS — M25512 Pain in left shoulder: Secondary | ICD-10-CM | POA: Diagnosis not present

## 2019-09-14 DIAGNOSIS — M542 Cervicalgia: Secondary | ICD-10-CM | POA: Diagnosis not present

## 2019-09-28 DIAGNOSIS — M79622 Pain in left upper arm: Secondary | ICD-10-CM | POA: Diagnosis not present

## 2019-09-28 DIAGNOSIS — M25512 Pain in left shoulder: Secondary | ICD-10-CM | POA: Diagnosis not present

## 2019-10-05 DIAGNOSIS — M542 Cervicalgia: Secondary | ICD-10-CM | POA: Diagnosis not present

## 2019-10-05 DIAGNOSIS — G43719 Chronic migraine without aura, intractable, without status migrainosus: Secondary | ICD-10-CM | POA: Diagnosis not present

## 2019-10-05 DIAGNOSIS — M791 Myalgia, unspecified site: Secondary | ICD-10-CM | POA: Diagnosis not present

## 2019-10-07 DIAGNOSIS — H5213 Myopia, bilateral: Secondary | ICD-10-CM | POA: Diagnosis not present

## 2019-10-07 DIAGNOSIS — H52223 Regular astigmatism, bilateral: Secondary | ICD-10-CM | POA: Diagnosis not present

## 2019-10-07 DIAGNOSIS — H53003 Unspecified amblyopia, bilateral: Secondary | ICD-10-CM | POA: Diagnosis not present

## 2019-10-07 DIAGNOSIS — H1045 Other chronic allergic conjunctivitis: Secondary | ICD-10-CM | POA: Diagnosis not present

## 2019-10-07 DIAGNOSIS — E119 Type 2 diabetes mellitus without complications: Secondary | ICD-10-CM | POA: Diagnosis not present

## 2019-10-26 DIAGNOSIS — M791 Myalgia, unspecified site: Secondary | ICD-10-CM | POA: Diagnosis not present

## 2019-10-26 DIAGNOSIS — G43719 Chronic migraine without aura, intractable, without status migrainosus: Secondary | ICD-10-CM | POA: Diagnosis not present

## 2019-10-26 DIAGNOSIS — M542 Cervicalgia: Secondary | ICD-10-CM | POA: Diagnosis not present

## 2019-11-02 ENCOUNTER — Ambulatory Visit (INDEPENDENT_AMBULATORY_CARE_PROVIDER_SITE_OTHER): Payer: Medicare Other | Admitting: Family Medicine

## 2019-11-03 DIAGNOSIS — M25512 Pain in left shoulder: Secondary | ICD-10-CM | POA: Diagnosis not present

## 2019-11-03 DIAGNOSIS — M79622 Pain in left upper arm: Secondary | ICD-10-CM | POA: Diagnosis not present

## 2019-11-04 ENCOUNTER — Other Ambulatory Visit: Payer: Self-pay

## 2019-11-04 ENCOUNTER — Encounter (INDEPENDENT_AMBULATORY_CARE_PROVIDER_SITE_OTHER): Payer: Self-pay | Admitting: Physician Assistant

## 2019-11-04 ENCOUNTER — Ambulatory Visit (INDEPENDENT_AMBULATORY_CARE_PROVIDER_SITE_OTHER): Payer: Medicare Other | Admitting: Physician Assistant

## 2019-11-04 VITALS — BP 120/70 | HR 97 | Temp 97.9°F | Ht 71.0 in | Wt 308.0 lb

## 2019-11-04 DIAGNOSIS — Z6841 Body Mass Index (BMI) 40.0 and over, adult: Secondary | ICD-10-CM

## 2019-11-04 DIAGNOSIS — E7849 Other hyperlipidemia: Secondary | ICD-10-CM

## 2019-11-04 DIAGNOSIS — E559 Vitamin D deficiency, unspecified: Secondary | ICD-10-CM

## 2019-11-04 MED ORDER — VITAMIN D (ERGOCALCIFEROL) 1.25 MG (50000 UNIT) PO CAPS: 50000.0000 [IU] | ORAL_CAPSULE | ORAL | 0 refills | Status: DC

## 2019-11-04 NOTE — Progress Notes (Signed)
Chief Complaint:   OBESITY Cindy Phillips is here to discuss her progress with her obesity treatment plan along with follow-up of her obesity related diagnoses. Cindy Phillips is on the Category 1 Plan and states she is following her eating plan approximately 70% of the time. Cindy Phillips states she is exercising for 0 minutes 0 times per week.  Today's visit was #: 38 Starting weight: 289 lbs Starting date: 11/26/2017 Today's weight: 308 lbs Today's date: 11/04/2019 Total lbs lost to date: 0 Total lbs lost since last in-office visit: 0  Interim History: Cindy Phillips states that she did not feel comfortable doing Category 2, so she has been doing Category 1.  She reports that protein is expensive and she cannot afford to eat on Category 2.  She is also snacking in the middle of the night.  Subjective:   1. Vitamin D deficiency Cindy Phillips's Vitamin D level was 30.7 on 07/27/2019. She is currently taking vit D. She denies nausea, vomiting or muscle weakness.  2. Other hyperlipidemia Cindy Phillips has hyperlipidemia and has been trying to improve her cholesterol levels with intensive lifestyle modification including a low saturated fat diet, exercise and weight loss. She denies any chest pain, claudication or myalgias.  She is taking atorvastatin.  Her last lipid panel was not at goal.  Lab Results  Component Value Date   ALT 10 07/27/2019   AST 12 07/27/2019   ALKPHOS 132 (H) 07/27/2019   BILITOT 0.6 07/27/2019   Lab Results  Component Value Date   CHOL 201 (H) 07/27/2019   HDL 44 07/27/2019   LDLCALC 120 (H) 07/27/2019   TRIG 210 (H) 07/27/2019   CHOLHDL 4.1 09/11/2014   Assessment/Plan:   1. Vitamin D deficiency Low Vitamin D level contributes to fatigue and are associated with obesity, breast, and colon cancer. She agrees to continue to take prescription Vitamin D @50 ,000 IU every week and will follow-up for routine testing of Vitamin D, at least 2-3 times per year to avoid over-replacement. - Vitamin  D, Ergocalciferol, (DRISDOL) 1.25 MG (50000 UNIT) CAPS capsule; Take 1 capsule (50,000 Units total) by mouth every 7 (seven) days.  Dispense: 4 capsule; Refill: 0  2. Other hyperlipidemia Cardiovascular risk and specific lipid/LDL goals reviewed.  We discussed several lifestyle modifications today and Cindy Phillips will continue to work on diet, exercise and weight loss efforts. Orders and follow up as documented in patient record.   Counseling Intensive lifestyle modifications are the first line treatment for this issue. . Dietary changes: Increase soluble fiber. Decrease simple carbohydrates. . Exercise changes: Moderate to vigorous-intensity aerobic activity 150 minutes per week if tolerated. . Lipid-lowering medications: see documented in medical record.  3. Class 3 severe obesity with serious comorbidity and body mass index (BMI) of 40.0 to 44.9 in adult, unspecified obesity type (HCC) Cindy Phillips is currently in the action stage of change. As such, her goal is to continue with weight loss efforts. She has agreed to the Category 2 Plan.   Exercise goals: All adults should avoid inactivity. Some physical activity is better than none, and adults who participate in any amount of physical activity gain some health benefits.  Behavioral modification strategies: increasing lean protein intake and no skipping meals.  Protein alternatives were given.  Cindy Phillips has agreed to follow-up with our clinic in 3 weeks. She was informed of the importance of frequent follow-up visits to maximize her success with intensive lifestyle modifications for her multiple health conditions.   Objective:   Blood pressure 120/70,  pulse 97, temperature 97.9 F (36.6 C), temperature source Oral, height 5\' 11"  (1.803 m), weight (!) 308 lb (139.7 kg), SpO2 97 %. Body mass index is 42.96 kg/m.  General: Cooperative, alert, well developed, in no acute distress. HEENT: Conjunctivae and lids unremarkable. Cardiovascular: Regular  rhythm.  Lungs: Normal work of breathing. Neurologic: No focal deficits.   Lab Results  Component Value Date   CREATININE 0.86 07/27/2019   BUN 7 07/27/2019   NA 142 07/27/2019   K 4.4 07/27/2019   CL 103 07/27/2019   CO2 20 07/27/2019   Lab Results  Component Value Date   ALT 10 07/27/2019   AST 12 07/27/2019   ALKPHOS 132 (H) 07/27/2019   BILITOT 0.6 07/27/2019   Lab Results  Component Value Date   HGBA1C 5.6 07/27/2019   HGBA1C 5.8 (H) 03/02/2019   HGBA1C 5.6 09/08/2018   HGBA1C 5.3 04/23/2018   HGBA1C 5.4 11/26/2017   Lab Results  Component Value Date   INSULIN 56.4 (H) 07/27/2019   INSULIN 48.3 (H) 03/02/2019   INSULIN 31.2 (H) 04/23/2018   INSULIN 44.0 (H) 11/26/2017   Lab Results  Component Value Date   TSH 2.800 11/26/2017   Lab Results  Component Value Date   CHOL 201 (H) 07/27/2019   HDL 44 07/27/2019   LDLCALC 120 (H) 07/27/2019   TRIG 210 (H) 07/27/2019   CHOLHDL 4.1 09/11/2014   Lab Results  Component Value Date   WBC 7.6 07/27/2019   HGB 13.9 07/27/2019   HCT 42.4 07/27/2019   MCV 88 07/27/2019   PLT 227 07/27/2019   Obesity Behavioral Intervention Documentation for Insurance:   Approximately 15 minutes were spent on the discussion below.  ASK: We discussed the diagnosis of obesity with Cindy Phillips today and Cindy Phillips agreed to give [place] permission to discuss obesity behavioral modification therapy today.  ASSESS: Cindy Phillips has the diagnosis of obesity and her BMI today is 43.1. Cindy Phillips is in the action stage of change.   ADVISE: Cindy Phillips was educated on the multiple health risks of obesity as well as the benefit of weight loss to improve her health. She was advised of the need for long term treatment and the importance of lifestyle modifications to improve her current health and to decrease her risk of future health problems.  AGREE: Multiple dietary modification options and treatment options were discussed and Cindy Phillips agreed to follow the  recommendations documented in the above note.  ARRANGE: Cindy Phillips was educated on the importance of frequent visits to treat obesity as outlined per CMS and USPSTF guidelines and agreed to schedule her next follow up appointment today.  Attestation Statements:   Reviewed by clinician on day of visit: allergies, medications, problem list, medical history, surgical history, family history, social history, and previous encounter notes.  I, Water quality scientist, CMA, am acting as Location manager for [employer], PA-C.  I have reviewed the above documentation for accuracy and completeness, and I agree with the above. Abby Potash, PA-C

## 2019-11-05 DIAGNOSIS — S46212A Strain of muscle, fascia and tendon of other parts of biceps, left arm, initial encounter: Secondary | ICD-10-CM | POA: Diagnosis not present

## 2019-11-05 DIAGNOSIS — M79602 Pain in left arm: Secondary | ICD-10-CM | POA: Diagnosis not present

## 2019-11-05 DIAGNOSIS — M6281 Muscle weakness (generalized): Secondary | ICD-10-CM | POA: Diagnosis not present

## 2019-11-05 DIAGNOSIS — M2560 Stiffness of unspecified joint, not elsewhere classified: Secondary | ICD-10-CM | POA: Diagnosis not present

## 2019-11-09 DIAGNOSIS — M6281 Muscle weakness (generalized): Secondary | ICD-10-CM | POA: Diagnosis not present

## 2019-11-09 DIAGNOSIS — S46212A Strain of muscle, fascia and tendon of other parts of biceps, left arm, initial encounter: Secondary | ICD-10-CM | POA: Diagnosis not present

## 2019-11-09 DIAGNOSIS — M2560 Stiffness of unspecified joint, not elsewhere classified: Secondary | ICD-10-CM | POA: Diagnosis not present

## 2019-11-09 DIAGNOSIS — M79602 Pain in left arm: Secondary | ICD-10-CM | POA: Diagnosis not present

## 2019-11-12 DIAGNOSIS — M79602 Pain in left arm: Secondary | ICD-10-CM | POA: Diagnosis not present

## 2019-11-12 DIAGNOSIS — M6281 Muscle weakness (generalized): Secondary | ICD-10-CM | POA: Diagnosis not present

## 2019-11-12 DIAGNOSIS — M2560 Stiffness of unspecified joint, not elsewhere classified: Secondary | ICD-10-CM | POA: Diagnosis not present

## 2019-11-12 DIAGNOSIS — S46212A Strain of muscle, fascia and tendon of other parts of biceps, left arm, initial encounter: Secondary | ICD-10-CM | POA: Diagnosis not present

## 2019-11-13 DIAGNOSIS — M2560 Stiffness of unspecified joint, not elsewhere classified: Secondary | ICD-10-CM | POA: Diagnosis not present

## 2019-11-13 DIAGNOSIS — M79602 Pain in left arm: Secondary | ICD-10-CM | POA: Diagnosis not present

## 2019-11-13 DIAGNOSIS — M6281 Muscle weakness (generalized): Secondary | ICD-10-CM | POA: Diagnosis not present

## 2019-11-13 DIAGNOSIS — S46212A Strain of muscle, fascia and tendon of other parts of biceps, left arm, initial encounter: Secondary | ICD-10-CM | POA: Diagnosis not present

## 2019-11-16 DIAGNOSIS — M79622 Pain in left upper arm: Secondary | ICD-10-CM | POA: Diagnosis not present

## 2019-11-17 DIAGNOSIS — G43719 Chronic migraine without aura, intractable, without status migrainosus: Secondary | ICD-10-CM | POA: Diagnosis not present

## 2019-11-17 DIAGNOSIS — M542 Cervicalgia: Secondary | ICD-10-CM | POA: Diagnosis not present

## 2019-11-17 DIAGNOSIS — M791 Myalgia, unspecified site: Secondary | ICD-10-CM | POA: Diagnosis not present

## 2019-11-18 DIAGNOSIS — M2560 Stiffness of unspecified joint, not elsewhere classified: Secondary | ICD-10-CM | POA: Diagnosis not present

## 2019-11-18 DIAGNOSIS — M79602 Pain in left arm: Secondary | ICD-10-CM | POA: Diagnosis not present

## 2019-11-18 DIAGNOSIS — M6281 Muscle weakness (generalized): Secondary | ICD-10-CM | POA: Diagnosis not present

## 2019-11-18 DIAGNOSIS — S46212A Strain of muscle, fascia and tendon of other parts of biceps, left arm, initial encounter: Secondary | ICD-10-CM | POA: Diagnosis not present

## 2019-11-19 DIAGNOSIS — S46212A Strain of muscle, fascia and tendon of other parts of biceps, left arm, initial encounter: Secondary | ICD-10-CM | POA: Diagnosis not present

## 2019-11-19 DIAGNOSIS — M6281 Muscle weakness (generalized): Secondary | ICD-10-CM | POA: Diagnosis not present

## 2019-11-19 DIAGNOSIS — M79602 Pain in left arm: Secondary | ICD-10-CM | POA: Diagnosis not present

## 2019-11-19 DIAGNOSIS — M2560 Stiffness of unspecified joint, not elsewhere classified: Secondary | ICD-10-CM | POA: Diagnosis not present

## 2019-11-20 DIAGNOSIS — S46212A Strain of muscle, fascia and tendon of other parts of biceps, left arm, initial encounter: Secondary | ICD-10-CM | POA: Diagnosis not present

## 2019-11-20 DIAGNOSIS — M6281 Muscle weakness (generalized): Secondary | ICD-10-CM | POA: Diagnosis not present

## 2019-11-20 DIAGNOSIS — M2560 Stiffness of unspecified joint, not elsewhere classified: Secondary | ICD-10-CM | POA: Diagnosis not present

## 2019-11-20 DIAGNOSIS — M79602 Pain in left arm: Secondary | ICD-10-CM | POA: Diagnosis not present

## 2019-11-23 DIAGNOSIS — M2560 Stiffness of unspecified joint, not elsewhere classified: Secondary | ICD-10-CM | POA: Diagnosis not present

## 2019-11-23 DIAGNOSIS — S46212A Strain of muscle, fascia and tendon of other parts of biceps, left arm, initial encounter: Secondary | ICD-10-CM | POA: Diagnosis not present

## 2019-11-23 DIAGNOSIS — M6281 Muscle weakness (generalized): Secondary | ICD-10-CM | POA: Diagnosis not present

## 2019-11-23 DIAGNOSIS — M79602 Pain in left arm: Secondary | ICD-10-CM | POA: Diagnosis not present

## 2019-11-24 DIAGNOSIS — M2560 Stiffness of unspecified joint, not elsewhere classified: Secondary | ICD-10-CM | POA: Diagnosis not present

## 2019-11-24 DIAGNOSIS — M79602 Pain in left arm: Secondary | ICD-10-CM | POA: Diagnosis not present

## 2019-11-24 DIAGNOSIS — M6281 Muscle weakness (generalized): Secondary | ICD-10-CM | POA: Diagnosis not present

## 2019-11-24 DIAGNOSIS — S46212A Strain of muscle, fascia and tendon of other parts of biceps, left arm, initial encounter: Secondary | ICD-10-CM | POA: Diagnosis not present

## 2019-11-25 ENCOUNTER — Ambulatory Visit (INDEPENDENT_AMBULATORY_CARE_PROVIDER_SITE_OTHER): Payer: Medicare Other | Admitting: Family Medicine

## 2019-11-25 DIAGNOSIS — S46212A Strain of muscle, fascia and tendon of other parts of biceps, left arm, initial encounter: Secondary | ICD-10-CM | POA: Diagnosis not present

## 2019-11-25 DIAGNOSIS — M6281 Muscle weakness (generalized): Secondary | ICD-10-CM | POA: Diagnosis not present

## 2019-11-25 DIAGNOSIS — M79602 Pain in left arm: Secondary | ICD-10-CM | POA: Diagnosis not present

## 2019-11-25 DIAGNOSIS — M2560 Stiffness of unspecified joint, not elsewhere classified: Secondary | ICD-10-CM | POA: Diagnosis not present

## 2019-11-30 DIAGNOSIS — M6281 Muscle weakness (generalized): Secondary | ICD-10-CM | POA: Diagnosis not present

## 2019-11-30 DIAGNOSIS — S46212A Strain of muscle, fascia and tendon of other parts of biceps, left arm, initial encounter: Secondary | ICD-10-CM | POA: Diagnosis not present

## 2019-11-30 DIAGNOSIS — M79602 Pain in left arm: Secondary | ICD-10-CM | POA: Diagnosis not present

## 2019-11-30 DIAGNOSIS — M2560 Stiffness of unspecified joint, not elsewhere classified: Secondary | ICD-10-CM | POA: Diagnosis not present

## 2019-12-01 DIAGNOSIS — M2560 Stiffness of unspecified joint, not elsewhere classified: Secondary | ICD-10-CM | POA: Diagnosis not present

## 2019-12-01 DIAGNOSIS — M79602 Pain in left arm: Secondary | ICD-10-CM | POA: Diagnosis not present

## 2019-12-01 DIAGNOSIS — S46212A Strain of muscle, fascia and tendon of other parts of biceps, left arm, initial encounter: Secondary | ICD-10-CM | POA: Diagnosis not present

## 2019-12-01 DIAGNOSIS — M6281 Muscle weakness (generalized): Secondary | ICD-10-CM | POA: Diagnosis not present

## 2019-12-03 DIAGNOSIS — M6281 Muscle weakness (generalized): Secondary | ICD-10-CM | POA: Diagnosis not present

## 2019-12-03 DIAGNOSIS — M2560 Stiffness of unspecified joint, not elsewhere classified: Secondary | ICD-10-CM | POA: Diagnosis not present

## 2019-12-03 DIAGNOSIS — S46212A Strain of muscle, fascia and tendon of other parts of biceps, left arm, initial encounter: Secondary | ICD-10-CM | POA: Diagnosis not present

## 2019-12-03 DIAGNOSIS — M79602 Pain in left arm: Secondary | ICD-10-CM | POA: Diagnosis not present

## 2019-12-05 ENCOUNTER — Ambulatory Visit: Payer: Medicare Other | Attending: Internal Medicine

## 2019-12-05 DIAGNOSIS — Z23 Encounter for immunization: Secondary | ICD-10-CM

## 2019-12-05 NOTE — Progress Notes (Signed)
   Covid-19 Vaccination Clinic  Name:  Cindy Phillips    MRN: [id number] DOB: 1991/03/06  12/05/2019  [name] was observed post Covid-19 immunization for 15 minutes without incident. She was provided with Vaccine Information Sheet and instruction to access the V-Safe system.   [name] was instructed to call 911 with any severe reactions post vaccine: Marland Kitchen Difficulty breathing  . Swelling of face and throat  . A fast heartbeat  . A bad rash all over body  . Dizziness and weakness   Immunizations Administered    Name Date Dose VIS Date Route   Pfizer COVID-19 Vaccine 12/05/2019 10:59 AM 0.3 mL 07/31/2019 Intramuscular   Manufacturer: [place]   Lot: [id number]   [place]: [id number]

## 2019-12-07 DIAGNOSIS — M2560 Stiffness of unspecified joint, not elsewhere classified: Secondary | ICD-10-CM | POA: Diagnosis not present

## 2019-12-07 DIAGNOSIS — M79602 Pain in left arm: Secondary | ICD-10-CM | POA: Diagnosis not present

## 2019-12-07 DIAGNOSIS — S46212A Strain of muscle, fascia and tendon of other parts of biceps, left arm, initial encounter: Secondary | ICD-10-CM | POA: Diagnosis not present

## 2019-12-07 DIAGNOSIS — M6281 Muscle weakness (generalized): Secondary | ICD-10-CM | POA: Diagnosis not present

## 2019-12-08 ENCOUNTER — Encounter (INDEPENDENT_AMBULATORY_CARE_PROVIDER_SITE_OTHER): Payer: Self-pay | Admitting: Physician Assistant

## 2019-12-08 ENCOUNTER — Ambulatory Visit (INDEPENDENT_AMBULATORY_CARE_PROVIDER_SITE_OTHER): Payer: Medicare Other | Admitting: Physician Assistant

## 2019-12-08 ENCOUNTER — Other Ambulatory Visit: Payer: Self-pay

## 2019-12-08 VITALS — BP 131/79 | HR 94 | Temp 97.9°F | Ht 71.0 in | Wt 320.0 lb

## 2019-12-08 DIAGNOSIS — E559 Vitamin D deficiency, unspecified: Secondary | ICD-10-CM

## 2019-12-08 DIAGNOSIS — M2560 Stiffness of unspecified joint, not elsewhere classified: Secondary | ICD-10-CM | POA: Diagnosis not present

## 2019-12-08 DIAGNOSIS — S46212A Strain of muscle, fascia and tendon of other parts of biceps, left arm, initial encounter: Secondary | ICD-10-CM | POA: Diagnosis not present

## 2019-12-08 DIAGNOSIS — Z6841 Body Mass Index (BMI) 40.0 and over, adult: Secondary | ICD-10-CM | POA: Diagnosis not present

## 2019-12-08 DIAGNOSIS — M6281 Muscle weakness (generalized): Secondary | ICD-10-CM | POA: Diagnosis not present

## 2019-12-08 DIAGNOSIS — R7303 Prediabetes: Secondary | ICD-10-CM

## 2019-12-08 DIAGNOSIS — M79602 Pain in left arm: Secondary | ICD-10-CM | POA: Diagnosis not present

## 2019-12-09 MED ORDER — VITAMIN D (ERGOCALCIFEROL) 1.25 MG (50000 UNIT) PO CAPS: 50000.0000 [IU] | ORAL_CAPSULE | ORAL | 0 refills | Status: DC

## 2019-12-09 NOTE — Progress Notes (Signed)
Chief Complaint:   OBESITY Cindy Phillips is here to discuss her progress with her obesity treatment plan along with follow-up of her obesity related diagnoses. Cindy Phillips is on the Category 2 Plan and states she is following her eating plan approximately 50% of the time. Cindy Phillips states she is walking 30-60 minutes 5 times per week.  Today's visit was #: 26 Starting weight: 289 lbs Starting date: 11/26/2017 Today's weight: 320 lbs Today's date: 12/08/2019 Total lbs lost to date: 0 Total lbs lost since last in-office visit: 0  Interim History: Cindy Phillips reports that her snacking is out of control at night. She is forgetting to take her metformin.  Subjective:   Vitamin D deficiency. Cindy Phillips is on prescription Vitamin D weekly. No nausea, vomiting, or muscle weakness. Last Vitamin D 30.7 on 07/27/2019.  Prediabetes. Cindy Phillips has a diagnosis of prediabetes based on her elevated HgA1c and was informed this puts her at greater risk of developing diabetes. She continues to work on diet and exercise to decrease her risk of diabetes. She denies nausea, vomiting, or diarrhea. No polyphagia. Cindy Phillips is on metformin.  Lab Results  Component Value Date   HGBA1C 5.6 07/27/2019   Lab Results  Component Value Date   INSULIN 56.4 (H) 07/27/2019   INSULIN 48.3 (H) 03/02/2019   INSULIN 31.2 (H) 04/23/2018   INSULIN 44.0 (H) 11/26/2017   Assessment/Plan:   Vitamin D deficiency. Low Vitamin D level contributes to fatigue and are associated with obesity, breast, and colon cancer. She was given a refill on her Vitamin D, Ergocalciferol, (DRISDOL) 1.25 MG (50000 UNIT) CAPS capsule every week #4 with 0 refills and will follow-up for routine testing of Vitamin D, at least 2-3 times per year to avoid over-replacement.   Prediabetes. Cindy Phillips will continue to work on weight loss, exercise, and decreasing simple carbohydrates to help decrease the risk of diabetes. She will continue taking metformin as  directed.  Class 3 severe obesity with serious comorbidity and body mass index (BMI) of 40.0 to 44.9 in adult, unspecified obesity type (Cindy Phillips).  Cindy Phillips is currently in the action stage of change. As such, her goal is to continue with weight loss efforts. She has agreed to the Category 2 Plan.   Exercise goals: For substantial health benefits, adults should do at least 150 minutes (2 hours and 30 minutes) a week of moderate-intensity, or 75 minutes (1 hour and 15 minutes) a week of vigorous-intensity aerobic physical activity, or an equivalent combination of moderate- and vigorous-intensity aerobic activity. Aerobic activity should be performed in episodes of at least 10 minutes, and preferably, it should be spread throughout the week.  Behavioral modification strategies: increasing lean protein intake and decreasing simple carbohydrates.  Cindy Phillips has agreed to follow-up with our clinic in 4 weeks. She was informed of the importance of frequent follow-up visits to maximize her success with intensive lifestyle modifications for her multiple health conditions.   Objective:   Blood pressure 131/79, pulse 94, temperature 97.9 F (36.6 C), temperature source Oral, height 5\' 11"  (1.803 m), weight (!) 320 lb (145.2 kg), SpO2 98 %. Body mass index is 44.63 kg/m.  General: Cooperative, alert, well developed, in no acute distress. HEENT: Conjunctivae and lids unremarkable. Cardiovascular: Regular rhythm.  Lungs: Normal work of breathing. Neurologic: No focal deficits.   Lab Results  Component Value Date   CREATININE 0.86 07/27/2019   BUN 7 07/27/2019   NA 142 07/27/2019   K 4.4 07/27/2019   CL 103  07/27/2019   CO2 20 07/27/2019   Lab Results  Component Value Date   ALT 10 07/27/2019   AST 12 07/27/2019   ALKPHOS 132 (H) 07/27/2019   BILITOT 0.6 07/27/2019   Lab Results  Component Value Date   HGBA1C 5.6 07/27/2019   HGBA1C 5.8 (H) 03/02/2019   HGBA1C 5.6 09/08/2018   HGBA1C 5.3  04/23/2018   HGBA1C 5.4 11/26/2017   Lab Results  Component Value Date   INSULIN 56.4 (H) 07/27/2019   INSULIN 48.3 (H) 03/02/2019   INSULIN 31.2 (H) 04/23/2018   INSULIN 44.0 (H) 11/26/2017   Lab Results  Component Value Date   TSH 2.800 11/26/2017   Lab Results  Component Value Date   CHOL 201 (H) 07/27/2019   HDL 44 07/27/2019   LDLCALC 120 (H) 07/27/2019   TRIG 210 (H) 07/27/2019   CHOLHDL 4.1 09/11/2014   Lab Results  Component Value Date   WBC 7.6 07/27/2019   HGB 13.9 07/27/2019   HCT 42.4 07/27/2019   MCV 88 07/27/2019   PLT 227 07/27/2019   No results found for: IRON, TIBC, FERRITIN  Obesity Behavioral Intervention Documentation for Insurance:   Approximately 15 minutes were spent on the discussion below.  ASK: We discussed the diagnosis of obesity with Cindy Phillips today and Cindy Phillips agreed to give [place] permission to discuss obesity behavioral modification therapy today.  ASSESS: Cindy Phillips has the diagnosis of obesity and her BMI today is 44.6. Cindy Phillips is in the action stage of change.   ADVISE: Cindy Phillips was educated on the multiple health risks of obesity as well as the benefit of weight loss to improve her health. She was advised of the need for long term treatment and the importance of lifestyle modifications to improve her current health and to decrease her risk of future health problems.  AGREE: Multiple dietary modification options and treatment options were discussed and Cindy Phillips agreed to follow the recommendations documented in the above note.  ARRANGE: Cindy Phillips was educated on the importance of frequent visits to treat obesity as outlined per CMS and USPSTF guidelines and agreed to schedule her next follow up appointment today.  Attestation Statements:   Reviewed by clinician on day of visit: allergies, medications, problem list, medical history, surgical history, family history, social history, and previous encounter notes.  Cindy Phillips, am acting as  transcriptionist for Abby Potash, PA-C   I have reviewed the above documentation for accuracy and completeness, and I agree with the above. Abby Potash, PA-C

## 2019-12-10 DIAGNOSIS — R519 Headache, unspecified: Secondary | ICD-10-CM | POA: Diagnosis not present

## 2019-12-10 DIAGNOSIS — M6281 Muscle weakness (generalized): Secondary | ICD-10-CM | POA: Diagnosis not present

## 2019-12-10 DIAGNOSIS — S46212A Strain of muscle, fascia and tendon of other parts of biceps, left arm, initial encounter: Secondary | ICD-10-CM | POA: Diagnosis not present

## 2019-12-10 DIAGNOSIS — M79622 Pain in left upper arm: Secondary | ICD-10-CM | POA: Diagnosis not present

## 2019-12-10 DIAGNOSIS — M79602 Pain in left arm: Secondary | ICD-10-CM | POA: Diagnosis not present

## 2019-12-10 DIAGNOSIS — R7303 Prediabetes: Secondary | ICD-10-CM | POA: Diagnosis not present

## 2019-12-10 DIAGNOSIS — M2560 Stiffness of unspecified joint, not elsewhere classified: Secondary | ICD-10-CM | POA: Diagnosis not present

## 2019-12-14 DIAGNOSIS — M79622 Pain in left upper arm: Secondary | ICD-10-CM | POA: Diagnosis not present

## 2019-12-15 DIAGNOSIS — M2560 Stiffness of unspecified joint, not elsewhere classified: Secondary | ICD-10-CM | POA: Diagnosis not present

## 2019-12-15 DIAGNOSIS — M6281 Muscle weakness (generalized): Secondary | ICD-10-CM | POA: Diagnosis not present

## 2019-12-15 DIAGNOSIS — S46212A Strain of muscle, fascia and tendon of other parts of biceps, left arm, initial encounter: Secondary | ICD-10-CM | POA: Diagnosis not present

## 2019-12-15 DIAGNOSIS — M79602 Pain in left arm: Secondary | ICD-10-CM | POA: Diagnosis not present

## 2019-12-17 DIAGNOSIS — M79602 Pain in left arm: Secondary | ICD-10-CM | POA: Diagnosis not present

## 2019-12-17 DIAGNOSIS — M2560 Stiffness of unspecified joint, not elsewhere classified: Secondary | ICD-10-CM | POA: Diagnosis not present

## 2019-12-17 DIAGNOSIS — M6281 Muscle weakness (generalized): Secondary | ICD-10-CM | POA: Diagnosis not present

## 2019-12-17 DIAGNOSIS — S46212A Strain of muscle, fascia and tendon of other parts of biceps, left arm, initial encounter: Secondary | ICD-10-CM | POA: Diagnosis not present

## 2019-12-18 DIAGNOSIS — E1165 Type 2 diabetes mellitus with hyperglycemia: Secondary | ICD-10-CM | POA: Diagnosis not present

## 2019-12-18 DIAGNOSIS — E785 Hyperlipidemia, unspecified: Secondary | ICD-10-CM | POA: Diagnosis not present

## 2019-12-22 DIAGNOSIS — S46212A Strain of muscle, fascia and tendon of other parts of biceps, left arm, initial encounter: Secondary | ICD-10-CM | POA: Diagnosis not present

## 2019-12-22 DIAGNOSIS — M79602 Pain in left arm: Secondary | ICD-10-CM | POA: Diagnosis not present

## 2019-12-22 DIAGNOSIS — M6281 Muscle weakness (generalized): Secondary | ICD-10-CM | POA: Diagnosis not present

## 2019-12-22 DIAGNOSIS — M2560 Stiffness of unspecified joint, not elsewhere classified: Secondary | ICD-10-CM | POA: Diagnosis not present

## 2019-12-24 DIAGNOSIS — M6281 Muscle weakness (generalized): Secondary | ICD-10-CM | POA: Diagnosis not present

## 2019-12-24 DIAGNOSIS — M2560 Stiffness of unspecified joint, not elsewhere classified: Secondary | ICD-10-CM | POA: Diagnosis not present

## 2019-12-24 DIAGNOSIS — S46212A Strain of muscle, fascia and tendon of other parts of biceps, left arm, initial encounter: Secondary | ICD-10-CM | POA: Diagnosis not present

## 2019-12-24 DIAGNOSIS — M79602 Pain in left arm: Secondary | ICD-10-CM | POA: Diagnosis not present

## 2019-12-29 DIAGNOSIS — G43719 Chronic migraine without aura, intractable, without status migrainosus: Secondary | ICD-10-CM | POA: Diagnosis not present

## 2019-12-29 DIAGNOSIS — M791 Myalgia, unspecified site: Secondary | ICD-10-CM | POA: Diagnosis not present

## 2019-12-29 DIAGNOSIS — M542 Cervicalgia: Secondary | ICD-10-CM | POA: Diagnosis not present

## 2019-12-30 ENCOUNTER — Ambulatory Visit: Payer: Medicare Other | Attending: Internal Medicine

## 2019-12-30 DIAGNOSIS — Z23 Encounter for immunization: Secondary | ICD-10-CM

## 2019-12-30 NOTE — Progress Notes (Signed)
   Covid-19 Vaccination Clinic  Name:  TUBA CALLIHAM    MRN: [id number] DOB: 1991-02-19  12/30/2019  Ms. Baral was observed post Covid-19 immunization for 15 minutes without incident. She was provided with Vaccine Information Sheet and instruction to access the V-Safe system.   Ms. Dishon was instructed to call 911 with any severe reactions post vaccine: Marland Kitchen Difficulty breathing  . Swelling of face and throat  . A fast heartbeat  . A bad rash all over body  . Dizziness and weakness   Immunizations Administered    Name Date Dose VIS Date Route   Pfizer COVID-19 Vaccine 12/30/2019  2:08 PM 0.3 mL 10/14/2018 Intramuscular   Manufacturer: [place]   Lot: [id number]   [place]: [id number]

## 2019-12-31 DIAGNOSIS — M2560 Stiffness of unspecified joint, not elsewhere classified: Secondary | ICD-10-CM | POA: Diagnosis not present

## 2019-12-31 DIAGNOSIS — M79602 Pain in left arm: Secondary | ICD-10-CM | POA: Diagnosis not present

## 2019-12-31 DIAGNOSIS — M6281 Muscle weakness (generalized): Secondary | ICD-10-CM | POA: Diagnosis not present

## 2019-12-31 DIAGNOSIS — S46212A Strain of muscle, fascia and tendon of other parts of biceps, left arm, initial encounter: Secondary | ICD-10-CM | POA: Diagnosis not present

## 2020-01-05 ENCOUNTER — Ambulatory Visit (INDEPENDENT_AMBULATORY_CARE_PROVIDER_SITE_OTHER): Payer: Medicare Other | Admitting: Physician Assistant

## 2020-01-05 DIAGNOSIS — S46212A Strain of muscle, fascia and tendon of other parts of biceps, left arm, initial encounter: Secondary | ICD-10-CM | POA: Diagnosis not present

## 2020-01-05 DIAGNOSIS — M79602 Pain in left arm: Secondary | ICD-10-CM | POA: Diagnosis not present

## 2020-01-05 DIAGNOSIS — M2560 Stiffness of unspecified joint, not elsewhere classified: Secondary | ICD-10-CM | POA: Diagnosis not present

## 2020-01-05 DIAGNOSIS — M6281 Muscle weakness (generalized): Secondary | ICD-10-CM | POA: Diagnosis not present

## 2020-01-06 ENCOUNTER — Ambulatory Visit (INDEPENDENT_AMBULATORY_CARE_PROVIDER_SITE_OTHER): Payer: Medicare Other | Admitting: Family Medicine

## 2020-01-06 ENCOUNTER — Encounter (INDEPENDENT_AMBULATORY_CARE_PROVIDER_SITE_OTHER): Payer: Self-pay | Admitting: Family Medicine

## 2020-01-06 ENCOUNTER — Other Ambulatory Visit: Payer: Self-pay

## 2020-01-06 VITALS — BP 116/79 | HR 101 | Temp 97.7°F | Ht 71.0 in | Wt 315.0 lb

## 2020-01-06 DIAGNOSIS — E559 Vitamin D deficiency, unspecified: Secondary | ICD-10-CM | POA: Diagnosis not present

## 2020-01-06 DIAGNOSIS — E119 Type 2 diabetes mellitus without complications: Secondary | ICD-10-CM

## 2020-01-06 DIAGNOSIS — Z6841 Body Mass Index (BMI) 40.0 and over, adult: Secondary | ICD-10-CM | POA: Diagnosis not present

## 2020-01-06 DIAGNOSIS — E7849 Other hyperlipidemia: Secondary | ICD-10-CM | POA: Diagnosis not present

## 2020-01-06 MED ORDER — VITAMIN D (ERGOCALCIFEROL) 1.25 MG (50000 UNIT) PO CAPS: 50000.0000 [IU] | ORAL_CAPSULE | ORAL | 0 refills | Status: DC

## 2020-01-06 NOTE — Progress Notes (Signed)
Chief Complaint:   OBESITY Cindy Phillips is here to discuss her progress with her obesity treatment plan along with follow-up of her obesity related diagnoses. Cindy Phillips is on the Category 2 Plan and states she is following her eating plan approximately 0% of the time. Cindy Phillips states she is walking for 30-40 minutes 7 times per week.  Today's visit was #: 90 Starting weight: 289 lbs Starting date: 11/26/2017 Today's weight: 315 lbs Today's date: 01/06/2020 Total lbs lost to date: 0 Total lbs lost since last in-office visit: 5  Interim History: Cindy Phillips did not follow the Category 2 plan due to poor appetite. She tried to eat protein daily, such as eggs, lunch meat, and fried chicken in the evening. Overall she likes the structure of Category 2 and wants to remain on this plan.  Subjective:   1. Type 2 diabetes mellitus without complication, without long-term current use of insulin (HCC) Cindy Phillips's A1c on 07/27/2019 was 5.6. She has been taking metformin 500 mg 2 tablets every morning. She has not been taking her evening dose. She feels that the 2 tablets in the morning reduced her appetite. She is not checking her BGs at home.  2. Vitamin D deficiency Cindy Phillips's Vit D level on 07/27/2019 was 30.7. She has been intermittently using prescription strength Vit D supplementation.  3. Other hyperlipidemia Cindy Phillips is taking atorvastatin 10 mg q daily, and she denies myalgias.  Assessment/Plan:   1. Type 2 diabetes mellitus without complication, without long-term current use of insulin (HCC) Good blood sugar control is important to decrease the likelihood of diabetic complications such as nephropathy, neuropathy, limb loss, blindness, coronary artery disease, and death. Intensive lifestyle modification including diet, exercise and weight loss are the first line of treatment for diabetes. We will check labs today. Cindy Phillips will continue metformin at 500 mg 2 tablets q AM. No refills needed today.  -  Comprehensive metabolic panel - CBC with Differential/Platelet - Hemoglobin A1c - Insulin, random - Lipid Panel With LDL/HDL Ratio - T3 - T4, free - TSH  2. Vitamin D deficiency Low Vitamin D level contributes to fatigue and are associated with obesity, breast, and colon cancer. We will check labs today, and we will refill prescription Vitamin D for 1 month. Cindy Phillips will follow-up for routine testing of Vitamin D, at least 2-3 times per year to avoid over-replacement.  - VITAMIN D 25 Hydroxy (Vit-D Deficiency, Fractures)  - Vitamin D, Ergocalciferol, (DRISDOL) 1.25 MG (50000 UNIT) CAPS capsule; Take 1 capsule (50,000 Units total) by mouth every 7 (seven) days.  Dispense: 4 capsule; Refill: 0  3. Other hyperlipidemia Cardiovascular risk and specific lipid/LDL goals reviewed. We discussed several lifestyle modifications today and Cindy Phillips will continue to work on diet, exercise and weight loss efforts, and will choose lean meat options. We will check labs today, and she will continue her current statin therapy. Orders and follow up as documented in patient record.   - Comprehensive metabolic panel - Lipid Panel With LDL/HDL Ratio  4. Class 3 severe obesity with serious comorbidity and body mass index (BMI) of 40.0 to 44.9 in adult, unspecified obesity type (HCC) Cindy Phillips is currently in the action stage of change. As such, her goal is to continue with weight loss efforts. She has agreed to the Category 2 Plan.   Exercise goals: As is.  Behavioral modification strategies: increasing vegetables, decreasing sodium intake, no skipping meals, meal planning and cooking strategies and better snacking choices.  Cindy Phillips has agreed to follow-up  with our clinic in 4 weeks. She was informed of the importance of frequent follow-up visits to maximize her success with intensive lifestyle modifications for her multiple health conditions.   Cindy Phillips was informed we would discuss her lab results at her next  visit unless there is a critical issue that needs to be addressed sooner. Cindy Phillips agreed to keep her next visit at the agreed upon time to discuss these results.  Objective:   Blood pressure 116/79, pulse (!) 101, temperature 97.7 F (36.5 C), temperature source Oral, height 5\' 11"  (1.803 m), weight (!) 315 lb (142.9 kg), SpO2 98 %. Body mass index is 43.93 kg/m.  General: Cooperative, alert, well developed, in no acute distress. HEENT: Conjunctivae and lids unremarkable. Cardiovascular: Regular rhythm.  Lungs: Normal work of breathing. Neurologic: No focal deficits.   Lab Results  Component Value Date   CREATININE 0.86 07/27/2019   BUN 7 07/27/2019   NA 142 07/27/2019   K 4.4 07/27/2019   CL 103 07/27/2019   CO2 20 07/27/2019   Lab Results  Component Value Date   ALT 10 07/27/2019   AST 12 07/27/2019   ALKPHOS 132 (H) 07/27/2019   BILITOT 0.6 07/27/2019   Lab Results  Component Value Date   HGBA1C 5.6 07/27/2019   HGBA1C 5.8 (H) 03/02/2019   HGBA1C 5.6 09/08/2018   HGBA1C 5.3 04/23/2018   HGBA1C 5.4 11/26/2017   Lab Results  Component Value Date   INSULIN 56.4 (H) 07/27/2019   INSULIN 48.3 (H) 03/02/2019   INSULIN 31.2 (H) 04/23/2018   INSULIN 44.0 (H) 11/26/2017   Lab Results  Component Value Date   TSH 2.800 11/26/2017   Lab Results  Component Value Date   CHOL 201 (H) 07/27/2019   HDL 44 07/27/2019   LDLCALC 120 (H) 07/27/2019   TRIG 210 (H) 07/27/2019   CHOLHDL 4.1 09/11/2014   Lab Results  Component Value Date   WBC 7.6 07/27/2019   HGB 13.9 07/27/2019   HCT 42.4 07/27/2019   MCV 88 07/27/2019   PLT 227 07/27/2019   No results found for: IRON, TIBC, FERRITIN  Obesity Behavioral Intervention Documentation for Insurance:   Approximately 15 minutes were spent on the discussion below.  ASK: We discussed the diagnosis of obesity with [name] today and Keniah agreed to give [place] permission to discuss obesity behavioral modification therapy  today.  ASSESS: Onyinyechi has the diagnosis of obesity and her BMI today is 43.95. Maely is in the action stage of change.   ADVISE: Lashonn was educated on the multiple health risks of obesity as well as the benefit of weight loss to improve her health. She was advised of the need for long term treatment and the importance of lifestyle modifications to improve her current health and to decrease her risk of future health problems.  AGREE: Multiple dietary modification options and treatment options were discussed and Candita agreed to follow the recommendations documented in the above note.  ARRANGE: Sayla was educated on the importance of frequent visits to treat obesity as outlined per CMS and USPSTF guidelines and agreed to schedule her next follow up appointment today.  Attestation Statements:   Reviewed by clinician on day of visit: allergies, medications, problem list, medical history, surgical history, family history, social history, and previous encounter notes.   I, Trixie Dredge, am acting as transcriptionist for Dennard Nip, MD.  I have reviewed the above documentation for accuracy and completeness, and I agree with the above. -  [employer],  MD   

## 2020-01-07 DIAGNOSIS — M79602 Pain in left arm: Secondary | ICD-10-CM | POA: Diagnosis not present

## 2020-01-07 DIAGNOSIS — M6281 Muscle weakness (generalized): Secondary | ICD-10-CM | POA: Diagnosis not present

## 2020-01-07 DIAGNOSIS — M2560 Stiffness of unspecified joint, not elsewhere classified: Secondary | ICD-10-CM | POA: Diagnosis not present

## 2020-01-07 DIAGNOSIS — S46212A Strain of muscle, fascia and tendon of other parts of biceps, left arm, initial encounter: Secondary | ICD-10-CM | POA: Diagnosis not present

## 2020-01-07 LAB — COMPREHENSIVE METABOLIC PANEL
ALT: 16 IU/L (ref 0–32)
AST: 14 IU/L (ref 0–40)
Albumin/Globulin Ratio: 1.5 (ref 1.2–2.2)
Albumin: 4.4 g/dL (ref 3.9–5.0)
Alkaline Phosphatase: 117 IU/L (ref 48–121)
BUN/Creatinine Ratio: 8 — ABNORMAL LOW (ref 9–23)
BUN: 7 mg/dL (ref 6–20)
Bilirubin Total: 0.4 mg/dL (ref 0.0–1.2)
CO2: 19 mmol/L — ABNORMAL LOW (ref 20–29)
Calcium: 9.8 mg/dL (ref 8.7–10.2)
Chloride: 106 mmol/L (ref 96–106)
Creatinine, Ser: 0.91 mg/dL (ref 0.57–1.00)
GFR calc Af Amer: 98 mL/min/{1.73_m2} (ref >59)
GFR calc non Af Amer: 85 mL/min/{1.73_m2} (ref >59)
Globulin, Total: 2.9 g/dL (ref 1.5–4.5)
Glucose: 125 mg/dL — ABNORMAL HIGH (ref 65–99)
Potassium: 4 mmol/L (ref 3.5–5.2)
Sodium: 139 mmol/L (ref 134–144)
Total Protein: 7.3 g/dL (ref 6.0–8.5)

## 2020-01-07 LAB — CBC WITH DIFFERENTIAL/PLATELET
Basophils Absolute: 0 10*3/uL (ref 0.0–0.2)
Basos: 0 %
EOS (ABSOLUTE): 0 10*3/uL (ref 0.0–0.4)
Eos: 0 %
Hematocrit: 39.3 % (ref 34.0–46.6)
Hemoglobin: 12.7 g/dL (ref 11.1–15.9)
Immature Grans (Abs): 0 10*3/uL (ref 0.0–0.1)
Immature Granulocytes: 0 %
Lymphocytes Absolute: 2.2 10*3/uL (ref 0.7–3.1)
Lymphs: 23 %
MCH: 28.8 pg (ref 26.6–33.0)
MCHC: 32.3 g/dL (ref 31.5–35.7)
MCV: 89 fL (ref 79–97)
Monocytes Absolute: 0.6 10*3/uL (ref 0.1–0.9)
Monocytes: 6 %
Neutrophils Absolute: 6.9 10*3/uL (ref 1.4–7.0)
Neutrophils: 71 %
Platelets: 211 10*3/uL (ref 150–450)
RBC: 4.41 x10E6/uL (ref 3.77–5.28)
RDW: 12.8 % (ref 11.7–15.4)
WBC: 9.7 10*3/uL (ref 3.4–10.8)

## 2020-01-07 LAB — INSULIN, RANDOM: INSULIN: 84.7 u[IU]/mL — ABNORMAL HIGH (ref 2.6–24.9)

## 2020-01-07 LAB — LIPID PANEL WITH LDL/HDL RATIO
Cholesterol, Total: 169 mg/dL (ref 100–199)
HDL: 42 mg/dL (ref >39)
LDL Chol Calc (NIH): 96 mg/dL (ref 0–99)
LDL/HDL Ratio: 2.3 ratio (ref 0.0–3.2)
Triglycerides: 182 mg/dL — ABNORMAL HIGH (ref 0–149)
VLDL Cholesterol Cal: 31 mg/dL (ref 5–40)

## 2020-01-07 LAB — T3: T3, Total: 174 ng/dL (ref 71–180)

## 2020-01-07 LAB — TSH: TSH: 2.61 u[IU]/mL (ref 0.450–4.500)

## 2020-01-07 LAB — HEMOGLOBIN A1C
Est. average glucose Bld gHb Est-mCnc: 123 mg/dL
Hgb A1c MFr Bld: 5.9 % — ABNORMAL HIGH (ref 4.8–5.6)

## 2020-01-07 LAB — VITAMIN D 25 HYDROXY (VIT D DEFICIENCY, FRACTURES): Vit D, 25-Hydroxy: 34.1 ng/mL (ref 30.0–100.0)

## 2020-01-07 LAB — T4, FREE: Free T4: 1.12 ng/dL (ref 0.82–1.77)

## 2020-01-09 DIAGNOSIS — E1165 Type 2 diabetes mellitus with hyperglycemia: Secondary | ICD-10-CM | POA: Diagnosis not present

## 2020-01-09 DIAGNOSIS — R000 Tachycardia, unspecified: Secondary | ICD-10-CM | POA: Diagnosis not present

## 2020-01-09 DIAGNOSIS — E1169 Type 2 diabetes mellitus with other specified complication: Secondary | ICD-10-CM | POA: Diagnosis not present

## 2020-01-09 DIAGNOSIS — M79622 Pain in left upper arm: Secondary | ICD-10-CM | POA: Diagnosis not present

## 2020-01-11 DIAGNOSIS — M2560 Stiffness of unspecified joint, not elsewhere classified: Secondary | ICD-10-CM | POA: Diagnosis not present

## 2020-01-11 DIAGNOSIS — M79602 Pain in left arm: Secondary | ICD-10-CM | POA: Diagnosis not present

## 2020-01-11 DIAGNOSIS — M6281 Muscle weakness (generalized): Secondary | ICD-10-CM | POA: Diagnosis not present

## 2020-01-11 DIAGNOSIS — S46212A Strain of muscle, fascia and tendon of other parts of biceps, left arm, initial encounter: Secondary | ICD-10-CM | POA: Diagnosis not present

## 2020-01-12 ENCOUNTER — Other Ambulatory Visit: Payer: Self-pay

## 2020-01-14 DIAGNOSIS — M6281 Muscle weakness (generalized): Secondary | ICD-10-CM | POA: Diagnosis not present

## 2020-01-14 DIAGNOSIS — M79602 Pain in left arm: Secondary | ICD-10-CM | POA: Diagnosis not present

## 2020-01-14 DIAGNOSIS — M2560 Stiffness of unspecified joint, not elsewhere classified: Secondary | ICD-10-CM | POA: Diagnosis not present

## 2020-01-14 DIAGNOSIS — S46212A Strain of muscle, fascia and tendon of other parts of biceps, left arm, initial encounter: Secondary | ICD-10-CM | POA: Diagnosis not present

## 2020-01-18 DIAGNOSIS — E785 Hyperlipidemia, unspecified: Secondary | ICD-10-CM | POA: Diagnosis not present

## 2020-01-18 DIAGNOSIS — E1165 Type 2 diabetes mellitus with hyperglycemia: Secondary | ICD-10-CM | POA: Diagnosis not present

## 2020-01-19 DIAGNOSIS — M2560 Stiffness of unspecified joint, not elsewhere classified: Secondary | ICD-10-CM | POA: Diagnosis not present

## 2020-01-19 DIAGNOSIS — S46212A Strain of muscle, fascia and tendon of other parts of biceps, left arm, initial encounter: Secondary | ICD-10-CM | POA: Diagnosis not present

## 2020-01-19 DIAGNOSIS — M79602 Pain in left arm: Secondary | ICD-10-CM | POA: Diagnosis not present

## 2020-01-19 DIAGNOSIS — M6281 Muscle weakness (generalized): Secondary | ICD-10-CM | POA: Diagnosis not present

## 2020-01-20 ENCOUNTER — Other Ambulatory Visit: Payer: Self-pay | Admitting: *Deleted

## 2020-01-21 ENCOUNTER — Other Ambulatory Visit (INDEPENDENT_AMBULATORY_CARE_PROVIDER_SITE_OTHER): Payer: Self-pay | Admitting: Physician Assistant

## 2020-01-21 DIAGNOSIS — E559 Vitamin D deficiency, unspecified: Secondary | ICD-10-CM

## 2020-01-22 NOTE — Patient Outreach (Signed)
[facility]) Care Management  [place]  01/20/2020  Cindy Phillips 10-Sep-1990 [phone]  RN Health Coach telephone call to patient.  [employer] compliance verified. Patient fasting blood sugar is 100. Her A1C id 5.9. Patient goal is for her to maintain A1C or less that 5.9 with weight loss. Per patient she has lost 5 pounds. Patient is going to weight loss clinic and talking with dietician. Per patient she is not  walking everyday but 1-3 times a week for 30 min. Patient has received her COVID vaccine. Patient has CPAP machine but is not using it as often as needed. RN discussed with patient about getting different masks.Patient does not have an advance directive. Patient has agreed to further outreach calls.   Encounter Medications:  Outpatient Encounter Medications as of 01/20/2020  Medication Sig   atorvastatin (LIPITOR) 10 MG tablet Take 10 mg by mouth daily.   baclofen (LIORESAL) 10 MG tablet Take 10 mg by mouth 3 (three) times daily.   hydrOXYzine (ATARAX/VISTARIL) 50 MG tablet Take 1 tablet (50 mg total) by mouth every 4 (four) hours as needed for anxiety.   lamoTRIgine (LAMICTAL) 25 MG tablet Take 1 tablet (25 mg total) by mouth daily. For mood stabilization   medroxyPROGESTERone (DEPO-PROVERA) 150 MG/ML injection Inject 1 mL (150 mg total) into the muscle every 3 (three) months. For prevention of pregnancy   metFORMIN (GLUCOPHAGE) 500 MG tablet Take two tabs p o Qam and One in the  PM (Patient taking differently: Take two tabs p o Qam)   OLANZapine zydis (ZYPREXA) 15 MG disintegrating tablet Take 1 tablet (15 mg total) by mouth at bedtime. For mood control   ondansetron (ZOFRAN ODT) 4 MG disintegrating tablet Take 1 tablet (4 mg total) by mouth every 8 (eight) hours as needed.   polyethylene glycol powder (GLYCOLAX/MIRALAX) powder Take 1 Container by mouth once.   sodium chloride (OCEAN) 0.65 % SOLN nasal spray Place 1 spray into both nostrils as  needed for congestion.   Vitamin D, Ergocalciferol, (DRISDOL) 1.25 MG (50000 UNIT) CAPS capsule Take 1 capsule (50,000 Units total) by mouth every 7 (seven) days.   zonisamide (ZONEGRAN) 25 MG capsule Take 25 mg by mouth daily.   No facility-administered encounter medications on file as of 01/20/2020.    Functional Status:  In your present state of health, do you have any difficulty performing the following activities: 01/20/2020  Hearing? N  Vision? N  Difficulty concentrating or making decisions? N  Walking or climbing stairs? Y  Comment patient is morbidly obese  Dressing or bathing? N  Preparing Food and eating ? N  Using the Toilet? N  In the past six months, have you accidently leaked urine? N  Do you have problems with loss of bowel control? N  Managing your Medications? N  Managing your Finances? N  Housekeeping or managing your Housekeeping? N  Some recent data might be hidden    Fall/Depression Screening: Fall Risk  10/12/2016 07/27/2016 05/16/2016  Falls in the past year? No No No   PHQ 2/9 Scores 01/21/2020 11/26/2017 10/12/2016 07/27/2016 05/16/2016  PHQ - 2 Score 2 2 0 1 1  PHQ- 9 Score 8 18 - - -  Exception Documentation - Medical reason - - -   THN CM Care Plan Problem One     Most Recent Value  Care Plan Problem One  Knowledge deficit in self management of diabetes  Role Documenting the Problem One  Health Coach  Care Plan for Problem One  Active  THN Long Term Goal   PAtient will maintain A1C 5.9 or less within the next 90 days  THN Long Term Goal Start Date  01/20/20  Interventions for Problem One Long Term Goal  RN disucssed A1C and Fbs range needed to maintain A1C. RN sent patient living welll with diabetes booklet. Rn will follow up with further discussion and monitoring.  THN CM Short Term Goal #1   Patient monitor blood sugars as per Dr order and document within the next 30 days  THN CM Short Term Goal #1 Start Date  01/20/20  Interventions for Short Term Goal  #1  RN discussed with patient about monitoring blood sugars as per Dr ordered. RN sent a calendar book for patien to document readings.  THN CM Short Term Goal #2   Patient will verbalize receiving list of healthy snacks  within the next 30 days  THN CM Short Term Goal #2 Start Date  01/20/20  Interventions for Short Term Goal #2  Rn discussed eating healthy snacks. RN sent patient a list of healthy snacks. RN will follow up with further discussion.  THN CM Short Term Goal #3  Patient will verbalize receiving list o diabetic foos to choose from take out menus within  the next 30 days  THN CM Short Term Goal #3 Start Date  01/20/20  Interventions for Short Tern Goal #3  RN discussed with patient about making the right choices form take out menus. RN sent the patient a list of fast food restaurants with food choices. RN will follow up with patient to see if using the list to assist in her choices.  THN CM Short Term Goal #4  Patient will verbalize receiving Advance Directive Packet within the next 30 days  THN CM Short Term Goal #4 Start Date  01/20/20  Interventions for Short Term Goal #4  RN discussed Advance directive. RN sent Advance directive packet. RN will follow up on receiving        Assessment:  A1C 5.9 FBS 100 Patient received COVID vaccine Patient has been seeing  Dietician Per patient she  has last 5 pounds within the last week Patient does not have an advance directive Patient will benefit from [place] telephonic outreach for education and support for diabetes self management. Plan:  RN discussed A1C and FBS RN sent living well with Diabetes RN sent a list of low carb snacks RN sent Diabetic dining out menu RN sent 2021 Calendar book for documentation RN sent assessment and barrier letter to PCP Advance Directive Packet RN will follow up outreach within the month of August  Cindy Phillips [facility] [phone]

## 2020-01-25 DIAGNOSIS — E1169 Type 2 diabetes mellitus with other specified complication: Secondary | ICD-10-CM | POA: Diagnosis not present

## 2020-01-25 DIAGNOSIS — R000 Tachycardia, unspecified: Secondary | ICD-10-CM | POA: Diagnosis not present

## 2020-01-28 DIAGNOSIS — M6281 Muscle weakness (generalized): Secondary | ICD-10-CM | POA: Diagnosis not present

## 2020-01-28 DIAGNOSIS — M2560 Stiffness of unspecified joint, not elsewhere classified: Secondary | ICD-10-CM | POA: Diagnosis not present

## 2020-01-28 DIAGNOSIS — S46212A Strain of muscle, fascia and tendon of other parts of biceps, left arm, initial encounter: Secondary | ICD-10-CM | POA: Diagnosis not present

## 2020-01-28 DIAGNOSIS — M79602 Pain in left arm: Secondary | ICD-10-CM | POA: Diagnosis not present

## 2020-02-01 DIAGNOSIS — S46212A Strain of muscle, fascia and tendon of other parts of biceps, left arm, initial encounter: Secondary | ICD-10-CM | POA: Diagnosis not present

## 2020-02-01 DIAGNOSIS — M6281 Muscle weakness (generalized): Secondary | ICD-10-CM | POA: Diagnosis not present

## 2020-02-01 DIAGNOSIS — M79602 Pain in left arm: Secondary | ICD-10-CM | POA: Diagnosis not present

## 2020-02-01 DIAGNOSIS — M2560 Stiffness of unspecified joint, not elsewhere classified: Secondary | ICD-10-CM | POA: Diagnosis not present

## 2020-02-08 DIAGNOSIS — M79602 Pain in left arm: Secondary | ICD-10-CM | POA: Diagnosis not present

## 2020-02-08 DIAGNOSIS — S46212A Strain of muscle, fascia and tendon of other parts of biceps, left arm, initial encounter: Secondary | ICD-10-CM | POA: Diagnosis not present

## 2020-02-08 DIAGNOSIS — M2560 Stiffness of unspecified joint, not elsewhere classified: Secondary | ICD-10-CM | POA: Diagnosis not present

## 2020-02-08 DIAGNOSIS — M6281 Muscle weakness (generalized): Secondary | ICD-10-CM | POA: Diagnosis not present

## 2020-02-10 ENCOUNTER — Ambulatory Visit (INDEPENDENT_AMBULATORY_CARE_PROVIDER_SITE_OTHER): Payer: Medicare Other | Admitting: Family Medicine

## 2020-02-11 ENCOUNTER — Ambulatory Visit (INDEPENDENT_AMBULATORY_CARE_PROVIDER_SITE_OTHER): Payer: Medicare Other | Admitting: Family Medicine

## 2020-02-11 ENCOUNTER — Encounter (INDEPENDENT_AMBULATORY_CARE_PROVIDER_SITE_OTHER): Payer: Self-pay | Admitting: Family Medicine

## 2020-02-11 ENCOUNTER — Other Ambulatory Visit: Payer: Self-pay

## 2020-02-11 VITALS — BP 119/78 | HR 105 | Temp 98.3°F | Ht 71.0 in | Wt 312.0 lb

## 2020-02-11 DIAGNOSIS — Z9189 Other specified personal risk factors, not elsewhere classified: Secondary | ICD-10-CM | POA: Diagnosis not present

## 2020-02-11 DIAGNOSIS — Z9111 Patient's noncompliance with dietary regimen: Secondary | ICD-10-CM

## 2020-02-11 DIAGNOSIS — E559 Vitamin D deficiency, unspecified: Secondary | ICD-10-CM | POA: Diagnosis not present

## 2020-02-11 DIAGNOSIS — M79602 Pain in left arm: Secondary | ICD-10-CM | POA: Diagnosis not present

## 2020-02-11 DIAGNOSIS — M2560 Stiffness of unspecified joint, not elsewhere classified: Secondary | ICD-10-CM | POA: Diagnosis not present

## 2020-02-11 DIAGNOSIS — E785 Hyperlipidemia, unspecified: Secondary | ICD-10-CM

## 2020-02-11 DIAGNOSIS — Z91199 Patient's noncompliance with other medical treatment and regimen due to unspecified reason: Secondary | ICD-10-CM

## 2020-02-11 DIAGNOSIS — M6281 Muscle weakness (generalized): Secondary | ICD-10-CM | POA: Diagnosis not present

## 2020-02-11 DIAGNOSIS — E1169 Type 2 diabetes mellitus with other specified complication: Secondary | ICD-10-CM | POA: Diagnosis not present

## 2020-02-11 DIAGNOSIS — Z6841 Body Mass Index (BMI) 40.0 and over, adult: Secondary | ICD-10-CM

## 2020-02-11 DIAGNOSIS — S46212A Strain of muscle, fascia and tendon of other parts of biceps, left arm, initial encounter: Secondary | ICD-10-CM | POA: Diagnosis not present

## 2020-02-15 DIAGNOSIS — M791 Myalgia, unspecified site: Secondary | ICD-10-CM | POA: Diagnosis not present

## 2020-02-15 DIAGNOSIS — M542 Cervicalgia: Secondary | ICD-10-CM | POA: Diagnosis not present

## 2020-02-15 DIAGNOSIS — G43719 Chronic migraine without aura, intractable, without status migrainosus: Secondary | ICD-10-CM | POA: Diagnosis not present

## 2020-02-15 MED ORDER — VITAMIN D (ERGOCALCIFEROL) 1.25 MG (50000 UNIT) PO CAPS: 50000.0000 [IU] | ORAL_CAPSULE | ORAL | 0 refills | Status: DC

## 2020-02-15 NOTE — Progress Notes (Signed)
Chief Complaint:   OBESITY Cindy Phillips is here to discuss her progress with her obesity treatment plan along with follow-up of her obesity related diagnoses. Cindy Phillips is on the Category 1 Plan and states she is following her eating plan approximately 0% of the time. Cindy Phillips states she is walking for 30-40 minutes 7 times per week.  Today's visit was #: 74 Starting weight: 289 lbs Starting date: 11/26/2017 Today's weight: 312 lbs Today's date: 02/11/2020 Total lbs lost to date:  0  Total lbs lost since last in-office visit: 3 lbs  Interim History: Cindy Phillips had her last office visit on 01/06/2020 with Dr. Leafy Ro.  She had fasting labs drawn then and is here to review them.  She is not following the plan and is eating more junk food.  She says that she feels hungry most of the time.  She report that she is taking her metformin 2 tablets in the morning, not as prescribed.  She is not taking it in the afternoon or as previously educated to.  Subjective:   1. Vitamin D deficiency Cindy Phillips Vitamin D level was 34.1 on 01/06/2020. She is currently taking prescription vitamin D 50,000 IU each week.  She is not taking it regularly.  She denies nausea, vomiting or muscle weakness.  2. Type 2 diabetes mellitus with other specified complication, without long-term current use of insulin (HCC) Fasting blood sugars range from 90s-140s on average.  She is taking metformin daily with no concerns.  No highs or low blood sugars or other symptoms.  She was diagnosed with diabetes years ago (age 29 or so) per patient.  A1c was 5.6 in December 2020, and is now up to 5.9 recently!  Fasting insulin was 56, and is now up to 84!  Lab Results  Component Value Date   HGBA1C 5.9 (H) 01/06/2020   HGBA1C 5.6 07/27/2019   HGBA1C 5.8 (H) 03/02/2019   Lab Results  Component Value Date   LDLCALC 96 01/06/2020   CREATININE 0.91 01/06/2020   Lab Results  Component Value Date   INSULIN 84.7 (H) 01/06/2020   INSULIN 56.4  (H) 07/27/2019   INSULIN 48.3 (H) 03/02/2019   INSULIN 31.2 (H) 04/23/2018   INSULIN 44.0 (H) 11/26/2017   3. Hyperlipidemia associated with type 2 diabetes mellitus (Cindy Phillips) Cindy Phillips has hyperlipidemia and has been trying to improve her cholesterol levels with intensive lifestyle modification including a low saturated fat diet, exercise and weight loss. She denies any chest pain, claudication or myalgias.  Cindy Phillips has been on Lipitor for several years.  Triglycerides and LDL have decreased.  Lab Results  Component Value Date   ALT 16 01/06/2020   AST 14 01/06/2020   ALKPHOS 117 01/06/2020   BILITOT 0.4 01/06/2020   Lab Results  Component Value Date   CHOL 169 01/06/2020   HDL 42 01/06/2020   LDLCALC 96 01/06/2020   TRIG 182 (H) 01/06/2020   CHOLHDL 4.1 09/11/2014   4. Noncompliance with treatment plan Cindy Phillips admits that she forgets to take her different medications and that she has been following our prescribed nutrition plan 0% of the time.  5. At risk for deficient knowledge of diabetes mellitus Cindy Phillips is at higher than average risk for developing diabetes due to her obesity.   Assessment/Plan:   1. Vitamin D deficiency Low Vitamin D level contributes to fatigue and are associated with obesity, breast, and colon cancer. She agrees to continue to take prescription Vitamin D @50 ,000 IU every week and  will follow-up for routine testing of Vitamin D, at least 2-3 times per year to avoid over-replacement. - Vitamin D, Ergocalciferol, (DRISDOL) 1.25 MG (50000 UNIT) CAPS capsule; Take 1 capsule (50,000 Units total) by mouth every 7 (seven) days.  Dispense: 4 capsule; Refill: 0  2. Type 2 diabetes mellitus with other specified complication, without long-term current use of insulin (HCC) Take metformin as prescribed and not just in the morning.  Take around 2 pm in the afternoon to help with evening cravings/hunger.  Decrease simple carbs and junk food.  3. Hyperlipidemia associated  with type 2 diabetes mellitus ([place]) Cardiovascular risk and specific lipid/LDL goals reviewed.  We discussed several lifestyle modifications today and Cindy Phillips will continue to work on diet, exercise and weight loss efforts. Orders and follow up as documented in patient record.  Cholesterol is not at goal until LDL less than 70.  Continue Lipitor every night.   Counseling Intensive lifestyle modifications are the first line treatment for this issue. . Dietary changes: Increase soluble fiber. Decrease simple carbohydrates. . Exercise changes: Moderate to vigorous-intensity aerobic activity 150 minutes per week if tolerated. . Lipid-lowering medications: see documented in medical record.  4. Noncompliance with treatment plan Extensive education done on importance of health treatment plan adherence with possible detriment to health with noncompliant behavior.  5. At risk for deficient knowledge of diabetes mellitus Cindy Phillips was given approximately 15 minutes of diabetes education and counseling today. We discussed intensive lifestyle modifications today with an emphasis on weight loss as well as increasing exercise and decreasing simple carbohydrates in her diet. We also reviewed medication options with an emphasis on risk versus benefit of those discussed.   Repetitive spaced learning was employed today to elicit superior memory formation and behavioral change.  6. Class 3 severe obesity with serious comorbidity and body mass index (BMI) of 40.0 to 44.9 in adult, unspecified obesity type (HCC) Cindy Phillips is currently in the action stage of change. As such, her goal is to continue with weight loss efforts. She has agreed to the Category 2 Plan.   Exercise goals: As is.  Behavioral modification strategies: increasing lean protein intake, decreasing simple carbohydrates, increasing water intake, better snacking choices and decreasing junk food.  Cindy Phillips has agreed to follow-up with our clinic in 2  weeks. She was informed of the importance of frequent follow-up visits to maximize her success with intensive lifestyle modifications for her multiple health conditions.   Objective:   Blood pressure 119/78, pulse (!) 105, temperature 98.3 F (36.8 C), temperature source Oral, height 5\' 11"  (1.803 m), weight (!) 312 lb (141.5 kg), SpO2 99 %. Body mass index is 43.52 kg/m.  General: Cooperative, alert, well developed, in no acute distress. HEENT: Conjunctivae and lids unremarkable. Cardiovascular: Regular rhythm.  Lungs: Normal work of breathing. Neurologic: No focal deficits.   Lab Results  Component Value Date   CREATININE 0.91 01/06/2020   BUN 7 01/06/2020   NA 139 01/06/2020   K 4.0 01/06/2020   CL 106 01/06/2020   CO2 19 (L) 01/06/2020   Lab Results  Component Value Date   ALT 16 01/06/2020   AST 14 01/06/2020   ALKPHOS 117 01/06/2020   BILITOT 0.4 01/06/2020   Lab Results  Component Value Date   HGBA1C 5.9 (H) 01/06/2020   HGBA1C 5.6 07/27/2019   HGBA1C 5.8 (H) 03/02/2019   HGBA1C 5.6 09/08/2018   HGBA1C 5.3 04/23/2018   Lab Results  Component Value Date   INSULIN 84.7 (H) 01/06/2020  INSULIN 56.4 (H) 07/27/2019   INSULIN 48.3 (H) 03/02/2019   INSULIN 31.2 (H) 04/23/2018   INSULIN 44.0 (H) 11/26/2017   Lab Results  Component Value Date   TSH 2.610 01/06/2020   Lab Results  Component Value Date   CHOL 169 01/06/2020   HDL 42 01/06/2020   LDLCALC 96 01/06/2020   TRIG 182 (H) 01/06/2020   CHOLHDL 4.1 09/11/2014   Lab Results  Component Value Date   WBC 9.7 01/06/2020   HGB 12.7 01/06/2020   HCT 39.3 01/06/2020   MCV 89 01/06/2020   PLT 211 01/06/2020   Attestation Statements:   Reviewed by clinician on day of visit: allergies, medications, problem list, medical history, surgical history, family history, social history, and previous encounter notes.  I, Water quality scientist, CMA, am acting as Location manager for [employer], DO.  I have reviewed  the above documentation for accuracy and completeness, and I agree with the above. Mellody Dance, DO

## 2020-02-17 DIAGNOSIS — E1165 Type 2 diabetes mellitus with hyperglycemia: Secondary | ICD-10-CM | POA: Diagnosis not present

## 2020-02-17 DIAGNOSIS — E785 Hyperlipidemia, unspecified: Secondary | ICD-10-CM | POA: Diagnosis not present

## 2020-02-24 ENCOUNTER — Ambulatory Visit (INDEPENDENT_AMBULATORY_CARE_PROVIDER_SITE_OTHER): Payer: Medicare Other | Admitting: Family Medicine

## 2020-02-24 ENCOUNTER — Encounter (INDEPENDENT_AMBULATORY_CARE_PROVIDER_SITE_OTHER): Payer: Self-pay | Admitting: Family Medicine

## 2020-02-24 ENCOUNTER — Other Ambulatory Visit: Payer: Self-pay

## 2020-02-24 VITALS — BP 116/79 | HR 70 | Temp 98.4°F | Ht 71.0 in | Wt 308.0 lb

## 2020-02-24 DIAGNOSIS — R7303 Prediabetes: Secondary | ICD-10-CM | POA: Diagnosis not present

## 2020-02-24 DIAGNOSIS — E559 Vitamin D deficiency, unspecified: Secondary | ICD-10-CM | POA: Diagnosis not present

## 2020-02-24 DIAGNOSIS — Z6841 Body Mass Index (BMI) 40.0 and over, adult: Secondary | ICD-10-CM

## 2020-02-29 NOTE — Progress Notes (Signed)
Chief Complaint:   OBESITY Cindy Phillips is here to discuss her progress with her obesity treatment plan along with follow-up of her obesity related diagnoses. Ahlam is on the Category 2 Plan and states she is following her eating plan approximately 0% of the time. Tranika states she is walking for 60 minutes 7 times per week.  Today's visit was #: 77 Starting weight: 289 lbs Starting date: 11/26/2017 Today's weight: 308 lbs Today's date: 02/24/2020 Total lbs lost to date: 0 Total lbs lost since last in-office visit: 4  Interim History: Cindy Phillips has done better with weight loss but she has decreased her eating enough that her RMR has likely started to drop. She notes decreased hunger with increased metformin dose.  Subjective:   1. Vitamin D deficiency Cindy Phillips's last Vit D level is still low even on prescription Vit D. She notes fatigue.  2. Pre-diabetes Cindy Phillips's metformin was increased to 3 tablets daily, and she noted it was difficult to eat all of her food.  Assessment/Plan:   1. Vitamin D deficiency Low Vitamin D level contributes to fatigue and are associated with obesity, breast, and colon cancer. Diahann agreed to increase prescription Vitamin D to 50,000 IU every 3 days with no refills. She will follow-up for routine testing of Vitamin D, at least 2-3 times per year to avoid over-replacement.  - Vitamin D, Ergocalciferol, (DRISDOL) 1.25 MG (50000 UNIT) CAPS capsule; Take 1 capsule (50,000 Units total) by mouth every 3 (three) days.  Dispense: 10 capsule; Refill: 0  2. Pre-diabetes Cindy Phillips will continue to work on weight loss, exercise, and decreasing simple carbohydrates to help decrease the risk of diabetes. Cindy Phillips agreed to change back to metformin 1 tablet PO q AM and  Tablet PO q PM with no refills.  - metFORMIN (GLUCOPHAGE) 500 MG tablet; Take one tablet by mouth in the morning and one tablet by mouth in the evening  Dispense: 60 tablet; Refill: 0  3. Class 3 severe  obesity with serious comorbidity and body mass index (BMI) of 40.0 to 44.9 in adult, unspecified obesity type (HCC) Cindy Phillips is currently in the action stage of change. As such, her goal is to continue with weight loss efforts. She has agreed to the Category 2 Plan.   Cindy Phillips will adjust metformin and she is to work on eating everything on her meal plan.  Exercise goals: As is.  Behavioral modification strategies: no skipping meals.  Cindy Phillips has agreed to follow-up with our clinic in 3 weeks. She was informed of the importance of frequent follow-up visits to maximize her success with intensive lifestyle modifications for her multiple health conditions.   Objective:   Blood pressure 116/79, pulse 70, temperature 98.4 F (36.9 C), temperature source Oral, height 5\' 11"  (1.803 m), weight (!) 308 lb (139.7 kg), SpO2 100 %. Body mass index is 42.96 kg/m.  General: Cooperative, alert, well developed, in no acute distress. HEENT: Conjunctivae and lids unremarkable. Cardiovascular: Regular rhythm.  Lungs: Normal work of breathing. Neurologic: No focal deficits.   Lab Results  Component Value Date   CREATININE 0.91 01/06/2020   BUN 7 01/06/2020   NA 139 01/06/2020   K 4.0 01/06/2020   CL 106 01/06/2020   CO2 19 (L) 01/06/2020   Lab Results  Component Value Date   ALT 16 01/06/2020   AST 14 01/06/2020   ALKPHOS 117 01/06/2020   BILITOT 0.4 01/06/2020   Lab Results  Component Value Date   HGBA1C 5.9 (H) 01/06/2020  HGBA1C 5.6 07/27/2019   HGBA1C 5.8 (H) 03/02/2019   HGBA1C 5.6 09/08/2018   HGBA1C 5.3 04/23/2018   Lab Results  Component Value Date   INSULIN 84.7 (H) 01/06/2020   INSULIN 56.4 (H) 07/27/2019   INSULIN 48.3 (H) 03/02/2019   INSULIN 31.2 (H) 04/23/2018   INSULIN 44.0 (H) 11/26/2017   Lab Results  Component Value Date   TSH 2.610 01/06/2020   Lab Results  Component Value Date   CHOL 169 01/06/2020   HDL 42 01/06/2020   LDLCALC 96 01/06/2020   TRIG 182  (H) 01/06/2020   CHOLHDL 4.1 09/11/2014   Lab Results  Component Value Date   WBC 9.7 01/06/2020   HGB 12.7 01/06/2020   HCT 39.3 01/06/2020   MCV 89 01/06/2020   PLT 211 01/06/2020   No results found for: IRON, TIBC, FERRITIN  Obesity Behavioral Intervention Documentation for Insurance:   Approximately 15 minutes were spent on the discussion below.  ASK: We discussed the diagnosis of obesity with Cindy Phillips today and Cindy Phillips agreed to give [place] permission to discuss obesity behavioral modification therapy today.  ASSESS: Cindy Phillips has the diagnosis of obesity and her BMI today is 42.98. Cindy Phillips is in the action stage of change.   ADVISE: Cindy Phillips was educated on the multiple health risks of obesity as well as the benefit of weight loss to improve her health. She was advised of the need for long term treatment and the importance of lifestyle modifications to improve her current health and to decrease her risk of future health problems.  AGREE: Multiple dietary modification options and treatment options were discussed and Cindy Phillips agreed to follow the recommendations documented in the above note.  ARRANGE: Cindy Phillips was educated on the importance of frequent visits to treat obesity as outlined per CMS and USPSTF guidelines and agreed to schedule her next follow up appointment today.  Attestation Statements:   Reviewed by clinician on day of visit: allergies, medications, problem list, medical history, surgical history, family history, social history, and previous encounter notes.   I, Trixie Dredge, am acting as transcriptionist for Dennard Nip, MD.  I have reviewed the above documentation for accuracy and completeness, and I agree with the above. -  Dennard Nip, MD

## 2020-03-01 MED ORDER — METFORMIN HCL 500 MG PO TABS: ORAL_TABLET | ORAL | 0 refills | Status: DC

## 2020-03-01 MED ORDER — VITAMIN D (ERGOCALCIFEROL) 1.25 MG (50000 UNIT) PO CAPS: 50000.0000 [IU] | ORAL_CAPSULE | ORAL | 0 refills | Status: DC

## 2020-03-11 ENCOUNTER — Other Ambulatory Visit (INDEPENDENT_AMBULATORY_CARE_PROVIDER_SITE_OTHER): Payer: Self-pay | Admitting: Family Medicine

## 2020-03-11 DIAGNOSIS — E559 Vitamin D deficiency, unspecified: Secondary | ICD-10-CM

## 2020-03-11 DIAGNOSIS — R7303 Prediabetes: Secondary | ICD-10-CM

## 2020-03-18 DIAGNOSIS — E1165 Type 2 diabetes mellitus with hyperglycemia: Secondary | ICD-10-CM | POA: Diagnosis not present

## 2020-03-18 DIAGNOSIS — E785 Hyperlipidemia, unspecified: Secondary | ICD-10-CM | POA: Diagnosis not present

## 2020-03-21 ENCOUNTER — Other Ambulatory Visit: Payer: Self-pay | Admitting: *Deleted

## 2020-03-21 ENCOUNTER — Other Ambulatory Visit: Payer: Self-pay

## 2020-03-21 ENCOUNTER — Ambulatory Visit (INDEPENDENT_AMBULATORY_CARE_PROVIDER_SITE_OTHER): Payer: Medicare Other | Admitting: Family Medicine

## 2020-03-21 ENCOUNTER — Encounter (INDEPENDENT_AMBULATORY_CARE_PROVIDER_SITE_OTHER): Payer: Self-pay | Admitting: Family Medicine

## 2020-03-21 VITALS — BP 140/91 | HR 114 | Temp 98.3°F | Ht 71.0 in | Wt 306.0 lb

## 2020-03-21 DIAGNOSIS — Z6841 Body Mass Index (BMI) 40.0 and over, adult: Secondary | ICD-10-CM | POA: Diagnosis not present

## 2020-03-21 DIAGNOSIS — R7303 Prediabetes: Secondary | ICD-10-CM | POA: Diagnosis not present

## 2020-03-21 DIAGNOSIS — E559 Vitamin D deficiency, unspecified: Secondary | ICD-10-CM | POA: Diagnosis not present

## 2020-03-21 DIAGNOSIS — R000 Tachycardia, unspecified: Secondary | ICD-10-CM | POA: Diagnosis not present

## 2020-03-21 MED ORDER — METFORMIN HCL 500 MG PO TABS: ORAL_TABLET | ORAL | 0 refills | Status: DC

## 2020-03-21 NOTE — Patient Outreach (Signed)
[facility]) Care Management  03/21/2020  Cindy Phillips 02-Aug-1991 [id number]   RN Health Coach attempted follow up outreach call to patient.  Patient was unavailable. HIPPA compliance voicemail message left with return callback number.  Plan: RN will call patient again within 30 days.  [place] [phone]

## 2020-03-22 MED ORDER — VITAMIN D (ERGOCALCIFEROL) 1.25 MG (50000 UNIT) PO CAPS: 50000.0000 [IU] | ORAL_CAPSULE | ORAL | 0 refills | Status: DC

## 2020-03-22 NOTE — Progress Notes (Signed)
Chief Complaint:   OBESITY Cindy Phillips is here to discuss her progress with her obesity treatment plan along with follow-up of her obesity related diagnoses. Cindy Phillips is on the Category 2 Plan and states she is following her eating plan approximately 0% of the time. Cindy Phillips states she is walking for 60 minutes 7 times per week.  Today's visit was #: 23 Starting weight: 289 lbs Starting date: 11/26/2017 Today's weight: 306 lbs Today's date: 03/21/2020 Total lbs lost to date: 0 Total lbs lost since last in-office visit: 2  Interim History: Cindy Phillips has done better with weight loss but she is struggling with increased PM hunger. She is working on making better choices overall.  Subjective:   1. Tachycardia Cindy Phillips saw her primary care physician about her tachycardia and she was told it was likely related to her uncontrolled obstructive sleep apnea.  2. Vitamin D deficiency Cindy Phillips is stable on Vit D, but her level is not yet at goal.  3. Pre-diabetes Cindy Phillips notes increased evening polyphagia. She is taking second dose of metformin at bedtime instead of at dinner time.  Assessment/Plan:   1. Tachycardia Cindy Phillips was encouraged to use her CPAP regularly as uncontrolled obstructive sleep apnea can cause heart damage and even death. Will continue to monitor.  2. Vitamin D deficiency Low Vitamin D level contributes to fatigue and are associated with obesity, breast, and colon cancer. We will refill prescription Vitamin D for 1 month. Cindy Phillips will follow-up for routine testing of Vitamin D, at least 2-3 times per year to avoid over-replacement.  - Vitamin D, Ergocalciferol, (DRISDOL) 1.25 MG (50000 UNIT) CAPS capsule; Take 1 capsule (50,000 Units total) by mouth every 3 (three) days.  Dispense: 10 capsule; Refill: 0  3. Pre-diabetes Cindy Phillips will continue to work on weight loss, exercise, and decreasing simple carbohydrates to help decrease the risk of diabetes. Cindy Phillips agreed to change  metformin to q AM and with lunch or dinner, and we will refill metformin for 1 month.  - metFORMIN (GLUCOPHAGE) 500 MG tablet; Take one tablet by mouth in the morning and one tablet by mouth in the evening  Dispense: 60 tablet; Refill: 0  4. Class 3 severe obesity with serious comorbidity and body mass index (BMI) of 40.0 to 44.9 in adult, unspecified obesity type (HCC) Cindy Phillips is currently in the action stage of change. As such, her goal is to continue with weight loss efforts. She has agreed to the Category 1 Plan.   Exercise goals: As is.  Behavioral modification strategies: emotional eating strategies.  Cindy Phillips has agreed to follow-up with our clinic in 3 weeks. She was informed of the importance of frequent follow-up visits to maximize her success with intensive lifestyle modifications for her multiple health conditions.   Objective:   Blood pressure (!) 140/91, pulse (!) 114, temperature 98.3 F (36.8 C), temperature source Oral, height 5\' 11"  (1.803 m), weight (!) 306 lb (138.8 kg), SpO2 99 %. Body mass index is 42.68 kg/m.  General: Cooperative, alert, well developed, in no acute distress. HEENT: Conjunctivae and lids unremarkable. Cardiovascular: Regular rhythm.  Lungs: Normal work of breathing. Neurologic: No focal deficits.   Lab Results  Component Value Date   CREATININE 0.91 01/06/2020   BUN 7 01/06/2020   NA 139 01/06/2020   K 4.0 01/06/2020   CL 106 01/06/2020   CO2 19 (L) 01/06/2020   Lab Results  Component Value Date   ALT 16 01/06/2020   AST 14 01/06/2020   ALKPHOS  117 01/06/2020   BILITOT 0.4 01/06/2020   Lab Results  Component Value Date   HGBA1C 5.9 (H) 01/06/2020   HGBA1C 5.6 07/27/2019   HGBA1C 5.8 (H) 03/02/2019   HGBA1C 5.6 09/08/2018   HGBA1C 5.3 04/23/2018   Lab Results  Component Value Date   INSULIN 84.7 (H) 01/06/2020   INSULIN 56.4 (H) 07/27/2019   INSULIN 48.3 (H) 03/02/2019   INSULIN 31.2 (H) 04/23/2018   INSULIN 44.0 (H)  11/26/2017   Lab Results  Component Value Date   TSH 2.610 01/06/2020   Lab Results  Component Value Date   CHOL 169 01/06/2020   HDL 42 01/06/2020   LDLCALC 96 01/06/2020   TRIG 182 (H) 01/06/2020   CHOLHDL 4.1 09/11/2014   Lab Results  Component Value Date   WBC 9.7 01/06/2020   HGB 12.7 01/06/2020   HCT 39.3 01/06/2020   MCV 89 01/06/2020   PLT 211 01/06/2020   No results found for: IRON, TIBC, FERRITIN  Obesity Behavioral Intervention Documentation for Insurance:   Approximately 15 minutes were spent on the discussion below.  ASK: We discussed the diagnosis of obesity with Cindy Phillips today and Cindy Phillips agreed to give [place] permission to discuss obesity behavioral modification therapy today.  ASSESS: Cindy Phillips has the diagnosis of obesity and her BMI today is 42.7. Cindy Phillips is in the action stage of change.   ADVISE: Cindy Phillips was educated on the multiple health risks of obesity as well as the benefit of weight loss to improve her health. She was advised of the need for long term treatment and the importance of lifestyle modifications to improve her current health and to decrease her risk of future health problems.  AGREE: Multiple dietary modification options and treatment options were discussed and Cindy Phillips agreed to follow the recommendations documented in the above note.  ARRANGE: Cindy Phillips was educated on the importance of frequent visits to treat obesity as outlined per CMS and USPSTF guidelines and agreed to schedule her next follow up appointment today.  Attestation Statements:   Reviewed by clinician on day of visit: allergies, medications, problem list, medical history, surgical history, family history, social history, and previous encounter notes.   I, Trixie Dredge, am acting as transcriptionist for Dennard Nip, MD.  I have reviewed the above documentation for accuracy and completeness, and I agree with the above. -  Dennard Nip, MD

## 2020-03-29 DIAGNOSIS — E1169 Type 2 diabetes mellitus with other specified complication: Secondary | ICD-10-CM | POA: Diagnosis not present

## 2020-03-29 DIAGNOSIS — R040 Epistaxis: Secondary | ICD-10-CM | POA: Diagnosis not present

## 2020-04-03 ENCOUNTER — Other Ambulatory Visit (INDEPENDENT_AMBULATORY_CARE_PROVIDER_SITE_OTHER): Payer: Self-pay | Admitting: Family Medicine

## 2020-04-03 DIAGNOSIS — E559 Vitamin D deficiency, unspecified: Secondary | ICD-10-CM

## 2020-04-04 DIAGNOSIS — M791 Myalgia, unspecified site: Secondary | ICD-10-CM | POA: Diagnosis not present

## 2020-04-04 DIAGNOSIS — M542 Cervicalgia: Secondary | ICD-10-CM | POA: Diagnosis not present

## 2020-04-04 DIAGNOSIS — G43719 Chronic migraine without aura, intractable, without status migrainosus: Secondary | ICD-10-CM | POA: Diagnosis not present

## 2020-04-18 ENCOUNTER — Ambulatory Visit (INDEPENDENT_AMBULATORY_CARE_PROVIDER_SITE_OTHER): Payer: Medicare Other | Admitting: Family Medicine

## 2020-04-19 DIAGNOSIS — E1165 Type 2 diabetes mellitus with hyperglycemia: Secondary | ICD-10-CM | POA: Diagnosis not present

## 2020-04-19 DIAGNOSIS — E785 Hyperlipidemia, unspecified: Secondary | ICD-10-CM | POA: Diagnosis not present

## 2020-04-20 ENCOUNTER — Other Ambulatory Visit: Payer: Self-pay | Admitting: *Deleted

## 2020-04-20 NOTE — Patient Outreach (Signed)
[facility]) Care Management  [place]  04/20/2020   Cindy Phillips 01/06/1991 [id number]  RN Health Coach telephone call to patient.  Hipaa compliance verified. Per patient she has not checked her blood sugar today. Patient stated she has lost 3-4 pounds since last outreach. She is using her CPAP every night/ Patient has been going to the weight management clinic. Per patient the problem that she is experiencing is  Snacking. RN discussed healthy eating and portion control and drinks.Patient has agreed to follow up outreach calls.  Encounter Medications:  Outpatient Encounter Medications as of 04/20/2020  Medication Sig  . atorvastatin (LIPITOR) 10 MG tablet Take 10 mg by mouth daily.  . baclofen (LIORESAL) 10 MG tablet Take 10 mg by mouth 3 (three) times daily.  . hydrOXYzine (ATARAX/VISTARIL) 50 MG tablet Take 1 tablet (50 mg total) by mouth every 4 (four) hours as needed for anxiety.  . lamoTRIgine (LAMICTAL) 25 MG tablet Take 1 tablet (25 mg total) by mouth daily. For mood stabilization  . medroxyPROGESTERone (DEPO-PROVERA) 150 MG/ML injection Inject 1 mL (150 mg total) into the muscle every 3 (three) months. For prevention of pregnancy  . metFORMIN (GLUCOPHAGE) 500 MG tablet Take one tablet by mouth in the morning and one tablet by mouth in the evening  . OLANZapine zydis (ZYPREXA) 15 MG disintegrating tablet Take 1 tablet (15 mg total) by mouth at bedtime. For mood control  . ondansetron (ZOFRAN ODT) 4 MG disintegrating tablet Take 1 tablet (4 mg total) by mouth every 8 (eight) hours as needed.  . polyethylene glycol powder (GLYCOLAX/MIRALAX) powder Take 1 Container by mouth once.  . sodium chloride (OCEAN) 0.65 % SOLN nasal spray Place 1 spray into both nostrils as needed for congestion.  . Vitamin D, Ergocalciferol, (DRISDOL) 1.25 MG (50000 UNIT) CAPS capsule Take 1 capsule (50,000 Units total) by mouth every 3 (three) days.  Marland Kitchen zonisamide (ZONEGRAN) 25 MG  capsule Take 25 mg by mouth daily.   No facility-administered encounter medications on file as of 04/20/2020.    Functional Status:  In your present state of health, do you have any difficulty performing the following activities: 01/20/2020  Hearing? N  Vision? N  Difficulty concentrating or making decisions? N  Walking or climbing stairs? Y  Comment patient is morbidly obese  Dressing or bathing? N  Preparing Food and eating ? N  Using the Toilet? N  In the past six months, have you accidently leaked urine? N  Do you have problems with loss of bowel control? N  Managing your Medications? N  Managing your Finances? N  Housekeeping or managing your Housekeeping? N  Some recent data might be hidden    Fall/Depression Screening: Fall Risk  10/12/2016 07/27/2016 05/16/2016  Falls in the past year? No No No   PHQ 2/9 Scores 01/21/2020 11/26/2017 10/12/2016 07/27/2016 05/16/2016  PHQ - 2 Score 2 2 0 1 1  PHQ- 9 Score 8 18 - - -  Exception Documentation - Medical reason - - -     Goals Addressed              This Visit's Progress   .  A1C will be maintained at 5.9 or less (pt-stated)        CARE PLAN ENTRY (see longtitudinal plan of care for additional care plan information)  Objective:  Lab Results  Component Value Date   HGBA1C 5.9 (H) 01/06/2020 .   Lab Results  Component Value Date  CREATININE 0.91 01/06/2020   CREATININE 0.86 07/27/2019   CREATININE 0.86 05/13/2019 .   Marland Kitchen No results found for: EGFR  Current Barriers:  Marland Kitchen Knowledge Deficits related to basic Diabetes pathophysiology and self care/management . Behavior medication  Case Manager Clinical Goal(s):  Over the next 90 days, patient will demonstrate improved adherence to prescribed treatment plan for diabetes self care/management as evidenced by:  Marland Kitchen Verbalize daily monitoring and recording of CBG within 90 days . Verbalize adherence to ADA/ carb modified diet within the next 90 days . Verbalize exercise 3  days/week . Verbalize adherence to prescribed medication regimen within the next 90 days  . Verbalize monitoring snack intake . Verbalizes incorporating portion control in her diet Interventions:  . Reviewed scheduled/upcoming provider appointments including: PCP and weight Management  Patient Self Care Activities:  . Self administers oral medications as prescribed . Attends all scheduled provider appointments . Checks blood sugars as prescribed and utilize hyper and hypoglycemia protocol as needed . Adheres to prescribed ADA/carb modified . Portion control  Initial goal documentation        Assessment:  Patient had not checked blood sugar today A1C 5.9 Patient is going to weight management center Patient needs assistance choosing appropriate snacks Plan:  Provided educational material on Healthy low carb snacks Provided educational material on Planning healthy meals  And portion control Patient will follow up with time for booster vaccination Patient will exercise to assist with weight loss Provided exercise program activity booklet Provided educational material on when a diabetic what you can drink RN will follow up within the month of November  [facility] [place] [facility] [phone]

## 2020-05-02 DIAGNOSIS — R040 Epistaxis: Secondary | ICD-10-CM | POA: Diagnosis not present

## 2020-05-02 DIAGNOSIS — E1169 Type 2 diabetes mellitus with other specified complication: Secondary | ICD-10-CM | POA: Diagnosis not present

## 2020-05-16 DIAGNOSIS — M791 Myalgia, unspecified site: Secondary | ICD-10-CM | POA: Diagnosis not present

## 2020-05-16 DIAGNOSIS — G43719 Chronic migraine without aura, intractable, without status migrainosus: Secondary | ICD-10-CM | POA: Diagnosis not present

## 2020-05-16 DIAGNOSIS — M542 Cervicalgia: Secondary | ICD-10-CM | POA: Diagnosis not present

## 2020-05-19 DIAGNOSIS — Z23 Encounter for immunization: Secondary | ICD-10-CM | POA: Diagnosis not present

## 2020-06-07 DIAGNOSIS — Z23 Encounter for immunization: Secondary | ICD-10-CM | POA: Diagnosis not present

## 2020-06-08 DIAGNOSIS — M7661 Achilles tendinitis, right leg: Secondary | ICD-10-CM | POA: Diagnosis not present

## 2020-06-08 DIAGNOSIS — M7751 Other enthesopathy of right foot: Secondary | ICD-10-CM | POA: Diagnosis not present

## 2020-06-08 DIAGNOSIS — M7989 Other specified soft tissue disorders: Secondary | ICD-10-CM | POA: Diagnosis not present

## 2020-06-18 DIAGNOSIS — E1165 Type 2 diabetes mellitus with hyperglycemia: Secondary | ICD-10-CM | POA: Diagnosis not present

## 2020-06-18 DIAGNOSIS — E785 Hyperlipidemia, unspecified: Secondary | ICD-10-CM | POA: Diagnosis not present

## 2020-06-23 DIAGNOSIS — M722 Plantar fascial fibromatosis: Secondary | ICD-10-CM | POA: Diagnosis not present

## 2020-06-23 DIAGNOSIS — M7661 Achilles tendinitis, right leg: Secondary | ICD-10-CM | POA: Diagnosis not present

## 2020-06-26 DIAGNOSIS — Z23 Encounter for immunization: Secondary | ICD-10-CM | POA: Diagnosis not present

## 2020-06-27 ENCOUNTER — Ambulatory Visit: Payer: Medicaid Other | Admitting: *Deleted

## 2020-06-27 ENCOUNTER — Other Ambulatory Visit: Payer: Self-pay | Admitting: *Deleted

## 2020-06-28 NOTE — Patient Outreach (Signed)
[facility]) Care Management  [place]  06/28/2020   Cindy Phillips 08-Apr-1991 [id number]  RN Health Coach telephone call to patient.  Hipaa compliance verified. Per patient she has not been checking her blood sugar. Patient stated she is under the weather a little and is not exercising. Patient stated that she has not received the COVID booster yet or flu shot. Patient has not had any weight loss since last outreach. Patient stated that she is trying to maintain her weight right now 306 pounds since she feels a little under the weather.  Patient has agreed to follow up outreach calls.  Encounter Medications:  Outpatient Encounter Medications as of 06/27/2020  Medication Sig  . atorvastatin (LIPITOR) 10 MG tablet Take 10 mg by mouth daily.  . baclofen (LIORESAL) 10 MG tablet Take 10 mg by mouth 3 (three) times daily.  . hydrOXYzine (ATARAX/VISTARIL) 50 MG tablet Take 1 tablet (50 mg total) by mouth every 4 (four) hours as needed for anxiety.  . lamoTRIgine (LAMICTAL) 25 MG tablet Take 1 tablet (25 mg total) by mouth daily. For mood stabilization  . medroxyPROGESTERone (DEPO-PROVERA) 150 MG/ML injection Inject 1 mL (150 mg total) into the muscle every 3 (three) months. For prevention of pregnancy  . metFORMIN (GLUCOPHAGE) 500 MG tablet Take one tablet by mouth in the morning and one tablet by mouth in the evening  . OLANZapine zydis (ZYPREXA) 15 MG disintegrating tablet Take 1 tablet (15 mg total) by mouth at bedtime. For mood control  . ondansetron (ZOFRAN ODT) 4 MG disintegrating tablet Take 1 tablet (4 mg total) by mouth every 8 (eight) hours as needed.  . polyethylene glycol powder (GLYCOLAX/MIRALAX) powder Take 1 Container by mouth once.  . sodium chloride (OCEAN) 0.65 % SOLN nasal spray Place 1 spray into both nostrils as needed for congestion.  . Vitamin D, Ergocalciferol, (DRISDOL) 1.25 MG (50000 UNIT) CAPS capsule Take 1 capsule (50,000 Units total) by  mouth every 3 (three) days.  Marland Kitchen zonisamide (ZONEGRAN) 25 MG capsule Take 25 mg by mouth daily.   No facility-administered encounter medications on file as of 06/27/2020.    Functional Status:  In your present state of health, do you have any difficulty performing the following activities: 01/20/2020  Hearing? N  Vision? N  Difficulty concentrating or making decisions? N  Walking or climbing stairs? Y  Comment patient is morbidly obese  Dressing or bathing? N  Preparing Food and eating ? N  Using the Toilet? N  In the past six months, have you accidently leaked urine? N  Do you have problems with loss of bowel control? N  Managing your Medications? N  Managing your Finances? N  Housekeeping or managing your Housekeeping? N  Some recent data might be hidden    Fall/Depression Screening: Fall Risk  10/12/2016 07/27/2016 05/16/2016  Falls in the past year? No No No   PHQ 2/9 Scores 01/21/2020 11/26/2017 10/12/2016 07/27/2016 05/16/2016  PHQ - 2 Score 2 2 0 1 1  PHQ- 9 Score 8 18 - - -  Exception Documentation - Medical reason - - -    Assessment:  Goals Addressed            This Visit's Progress   . (THN)Eat Healthy       Follow Up Date  [id number]   - set goal weight - drink 6 to 8 glasses of water each day - fill half of plate with vegetables - limit fast food  meals to no more than 1 per week - manage portion size - set a realistic goal    Why is this important?   When you are ready to manage your nutrition or weight, having a plan and setting goals will help.  Taking small steps to change how you eat and exercise is a good place to start.    Notes:  Patient is attending a weight loss clinic    . [facility] and Keep All Appointments       Follow Up Date [id number]   - call to cancel if needed - keep a calendar with prescription refill dates - keep a calendar with appointment dates    Why is this important?   Part of staying healthy is seeing the doctor for follow-up care.   If you forget your appointments, there are some things you can do to stay on track.    Notes:     . (THN)Monitor and Manage My Blood Sugar       Follow Up Date [id number]   - check blood sugar at prescribed times - check blood sugar if I feel it is too high or too low - take the blood sugar log to all doctor visits - take the blood sugar meter to all doctor visits    Why is this important?   Checking your blood sugar at home helps to keep it from getting very high or very low.  Writing the results in a diary or log helps the doctor know how to care for you.  Your blood sugar log should have the time, date and the results.  Also, write down the amount of insulin or other medicine that you take.  Other information, like what you ate, exercise done and how you were feeling, will also be helpful.     Notes:  At this time the patient is not checking her blood sugars.RN is encouraging her to monitor    . [facility] Eye Exam       Follow Up Date [id number]   - keep appointment with eye doctor - schedule appointment with eye doctor    Why is this important?   Eye check-ups are important when you have diabetes.  Vision loss can be prevented.    Notes:     . (THN)Perform Foot Care       Follow Up Date [id number]   - check feet daily for cuts, sores or redness - trim toenails straight across - wash and dry feet carefully every day - wear comfortable, cotton socks - wear comfortable, well-fitting shoes    Why is this important?   Good foot care is very important when you have diabetes.  There are many things you can do to keep your feet healthy and catch a problem early.    Notes:     . (THN)Set My Target A1C       Follow Up Date [id number]   - set target A1C      Why is this important?   Your target A1C is decided together by you and your doctor.  It is based on several things like your age and other health issues.    Notes:  6.0 Patient has met her target A1C  Her A1C  is currently 5.9       Plan:  Patient will continue to go to the weight loss clinic Patient needs encouragement to adhere to diet and weight loss Encouraged patient to monitor blood sugars Encouraged patient to  a regular exercise routine Patient will follow up with flu shot and booster RN sent update assessment to PCP  [place] [facility] [phone]

## 2020-06-28 NOTE — Patient Instructions (Signed)
Goals Addressed            This Visit's Progress   . (THN)Eat Healthy       Follow Up Date  [id number]   - set goal weight - drink 6 to 8 glasses of water each day - fill half of plate with vegetables - limit fast food meals to no more than 1 per week - manage portion size - set a realistic goal    Why is this important?   When you are ready to manage your nutrition or weight, having a plan and setting goals will help.  Taking small steps to change how you eat and exercise is a good place to start.    Notes:  Patient is attending a weight loss clinic    . [facility] and Keep All Appointments       Follow Up Date [id number]   - call to cancel if needed - keep a calendar with prescription refill dates - keep a calendar with appointment dates    Why is this important?   Part of staying healthy is seeing the doctor for follow-up care.  If you forget your appointments, there are some things you can do to stay on track.    Notes:     . (THN)Monitor and Manage My Blood Sugar       Follow Up Date [id number]   - check blood sugar at prescribed times - check blood sugar if I feel it is too high or too low - take the blood sugar log to all doctor visits - take the blood sugar meter to all doctor visits    Why is this important?   Checking your blood sugar at home helps to keep it from getting very high or very low.  Writing the results in a diary or log helps the doctor know how to care for you.  Your blood sugar log should have the time, date and the results.  Also, write down the amount of insulin or other medicine that you take.  Other information, like what you ate, exercise done and how you were feeling, will also be helpful.     Notes:  At this time the patient is not checking her blood sugars.RN is encouraging her to monitor    . [facility] Eye Exam       Follow Up Date [id number]   - keep appointment with eye doctor - schedule appointment with eye doctor    Why is  this important?   Eye check-ups are important when you have diabetes.  Vision loss can be prevented.    Notes:     . (THN)Perform Foot Care       Follow Up Date [id number]   - check feet daily for cuts, sores or redness - trim toenails straight across - wash and dry feet carefully every day - wear comfortable, cotton socks - wear comfortable, well-fitting shoes    Why is this important?   Good foot care is very important when you have diabetes.  There are many things you can do to keep your feet healthy and catch a problem early.    Notes:     . (THN)Set My Target A1C       Follow Up Date [id number]   - set target A1C      Why is this important?   Your target A1C is decided together by you and your doctor.  It is based on several things like your age and  other health issues.    Notes:  6.0 Patient has met her target A1C  Her A1C is currently 5.9

## 2020-07-07 DIAGNOSIS — M722 Plantar fascial fibromatosis: Secondary | ICD-10-CM | POA: Diagnosis not present

## 2020-07-07 DIAGNOSIS — M7661 Achilles tendinitis, right leg: Secondary | ICD-10-CM | POA: Diagnosis not present

## 2020-07-08 DIAGNOSIS — G43719 Chronic migraine without aura, intractable, without status migrainosus: Secondary | ICD-10-CM | POA: Diagnosis not present

## 2020-07-08 DIAGNOSIS — M542 Cervicalgia: Secondary | ICD-10-CM | POA: Diagnosis not present

## 2020-07-08 DIAGNOSIS — M791 Myalgia, unspecified site: Secondary | ICD-10-CM | POA: Diagnosis not present

## 2020-07-19 DIAGNOSIS — E1165 Type 2 diabetes mellitus with hyperglycemia: Secondary | ICD-10-CM | POA: Diagnosis not present

## 2020-07-19 DIAGNOSIS — E785 Hyperlipidemia, unspecified: Secondary | ICD-10-CM | POA: Diagnosis not present

## 2020-07-25 DIAGNOSIS — M7661 Achilles tendinitis, right leg: Secondary | ICD-10-CM | POA: Diagnosis not present

## 2020-07-25 DIAGNOSIS — M7989 Other specified soft tissue disorders: Secondary | ICD-10-CM | POA: Diagnosis not present

## 2020-07-26 DIAGNOSIS — E1169 Type 2 diabetes mellitus with other specified complication: Secondary | ICD-10-CM | POA: Diagnosis not present

## 2020-07-26 DIAGNOSIS — I10 Essential (primary) hypertension: Secondary | ICD-10-CM | POA: Diagnosis not present

## 2020-07-27 ENCOUNTER — Ambulatory Visit (INDEPENDENT_AMBULATORY_CARE_PROVIDER_SITE_OTHER): Payer: Medicare Other | Admitting: Orthopaedic Surgery

## 2020-07-27 ENCOUNTER — Other Ambulatory Visit: Payer: Self-pay

## 2020-07-27 ENCOUNTER — Encounter: Payer: Self-pay | Admitting: Orthopaedic Surgery

## 2020-07-27 ENCOUNTER — Ambulatory Visit: Payer: Medicare Other

## 2020-07-27 VITALS — Ht 71.0 in

## 2020-07-27 DIAGNOSIS — M25522 Pain in left elbow: Secondary | ICD-10-CM

## 2020-07-27 NOTE — Progress Notes (Signed)
Office Visit Note   Patient: Cindy Phillips           Date of Birth: Apr 17, 1991           MRN: [id number] Visit Date: 07/27/2020              Requested by: Lucianne Lei, MD [address] [place],  [place] [address] PCP: Lucianne Lei, MD   Assessment & Plan: Visit Diagnoses:  1. Pain in left elbow     Plan: Cindy Phillips injured her left elbow several months ago while at work.  She did not file a Workmen's Comp. claim.  She has been experiencing pain with certain motions in particular with picking up objects along the distal medial humerus and proximal medial forearm.  X-rays were negative.  This is obviously a soft tissue injury without obvious skin changes.  Biceps is intact and there was no tenderness over either epicondyles.  No problems referable to the ulnar nerve.  After much discussion she would like to have an MRI scan.  We will set this up and plan to check her back shortly thereafter  Follow-Up Instructions: Return After MRI scan left elbow.   Orders:  Orders Placed This Encounter  Procedures  . XR Elbow 2 Views Left   No orders of the defined types were placed in this encounter.     Procedures: No procedures performed   Clinical Data: No additional findings.   Subjective: Chief Complaint  Patient presents with  . Left Shoulder - Pain  Patient presents today for left shoulder pain. She said that she has been hurting for months. She thinks the pain all started while lifting boxes at her last job. One of the boxes she was lifting broke and fell on her. She started having pain then. She said that she has some pain in her shoulder, but most of her pain in her forearm. No neck pain. She saw her PCP and was sent to physical therapy, but that did not help. She has taken Tylenol for pain. She is right hand dominant.  Present pain is associated with some swelling along the medial aspect of her elbow just proximal to the medial epicondyles.  She never noticed any black  or blue discoloration.  She is not had any neurologic symptoms distally.  She denies any neck or shoulder pain  HPI  Review of Systems   Objective: Vital Signs: Ht 5\' 11"  (1.803 m)   BMI 42.68 kg/m   Physical Exam Constitutional:      Appearance: She is well-developed.  Eyes:     Pupils: Pupils are equal, round, and reactive to light.  Pulmonary:     Effort: Pulmonary effort is normal.  Skin:    General: Skin is warm and dry.  Neurological:     Mental Status: She is alert and oriented to person, place, and time.  Psychiatric:        Behavior: Behavior normal.    Awake alert and oriented x3.  Comfortable sitting.  No pain with range of motion of the cervical spine in all planes.  No referred pain to either upper extremity with motion of her neck.  Full range of motion of the shoulder with negative impingement empty can testing and speeds sign.  Full overhead motion.  Biceps tendon intact.  Good strength with flexion and extension.  No pain over the triceps muscle.  No pain over either epicondyles.  No pain over the ulnar nerve.  Full range of motion of elbow in flexion extension pronation and supination.  No skin changes.  Good grip and good release.  No wrist pain.  There was some fullness along the medial distal humerus but similar on the right side.  No masses   Ortho ExamSpecialty Comments:  No specialty comments available.  Imaging: XR Elbow 2 Views Left  Result Date: 07/27/2020 Films of the left elbow obtained in 2 projections.  No evidence of any acute change.  No evidence of arthritis.  Negative fat pad.  No ectopic calcification.  Patient has been complaining of pain along the medial aspect of the distal humerus and proximal forearm.  No soft tissue abnormalities    PMFS History: Patient Active Problem List   Diagnosis Date Noted  . Pain in left elbow 07/27/2020  . Prediabetes 02/03/2019  . Obstructive sleep apnea 04/23/2018  . Other fatigue 11/26/2017  .  Shortness of breath on exertion 11/26/2017  . Type 2 diabetes mellitus without complication, without long-term current use of insulin ([place]) 11/26/2017  . Other hyperlipidemia 11/26/2017  . Vitamin D deficiency 11/26/2017  . Schizoaffective disorder, bipolar type ([place]) 09/14/2014  . Intellectual disability 09/10/2014   Past Medical History:  Diagnosis Date  . Bipolar 1 disorder ([place])   . Bowel incontinence   . Depression   . Diabetes mellitus without complication ([place])   . Mental disability     Family History  Problem Relation Age of Onset  . High blood pressure Mother   . Anxiety disorder Mother   . Diabetes Maternal Grandmother     Past Surgical History:  Procedure Laterality Date  . FOOT SURGERY     Social History   Occupational History  . Occupation: Clinical research associate     Comment: [employer]   Tobacco Use  . Smoking status: Never Smoker  . Smokeless tobacco: Never Used  Vaping Use  . Vaping Use: Never used  Substance and Sexual Activity  . Alcohol use: No  . Drug use: No  . Sexual activity: Not Currently

## 2020-07-27 NOTE — Addendum Note (Signed)
Addended by: Lendon Collar on: 07/27/2020 03:54 PM   Modules accepted: Orders

## 2020-08-06 ENCOUNTER — Emergency Department (HOSPITAL_COMMUNITY): Payer: Medicare Other

## 2020-08-06 ENCOUNTER — Encounter (HOSPITAL_COMMUNITY): Payer: Self-pay | Admitting: Emergency Medicine

## 2020-08-06 ENCOUNTER — Emergency Department (HOSPITAL_COMMUNITY)
Admission: EM | Admit: 2020-08-06 | Discharge: 2020-08-06 | Disposition: A | Discharge status: Home / Self Care | Payer: Medicare Other | Attending: Emergency Medicine | Admitting: Emergency Medicine

## 2020-08-06 ENCOUNTER — Other Ambulatory Visit: Payer: Self-pay

## 2020-08-06 DIAGNOSIS — Z7984 Long term (current) use of oral hypoglycemic drugs: Secondary | ICD-10-CM | POA: Diagnosis not present

## 2020-08-06 DIAGNOSIS — W010XXA Fall on same level from slipping, tripping and stumbling without subsequent striking against object, initial encounter: Secondary | ICD-10-CM | POA: Insufficient documentation

## 2020-08-06 DIAGNOSIS — W19XXXA Unspecified fall, initial encounter: Secondary | ICD-10-CM

## 2020-08-06 DIAGNOSIS — E119 Type 2 diabetes mellitus without complications: Secondary | ICD-10-CM | POA: Diagnosis not present

## 2020-08-06 DIAGNOSIS — M25562 Pain in left knee: Secondary | ICD-10-CM | POA: Diagnosis not present

## 2020-08-06 IMAGING — CR DG KNEE COMPLETE 4+V*L*
4 series · 4 of 4 positions shown · non-contrast
Comparison: None.

CLINICAL DATA: Fall yesterday.  Left knee pain.  Initial encounter.

EXAM:
LEFT KNEE - COMPLETE 4+ VIEW

[knee ap]
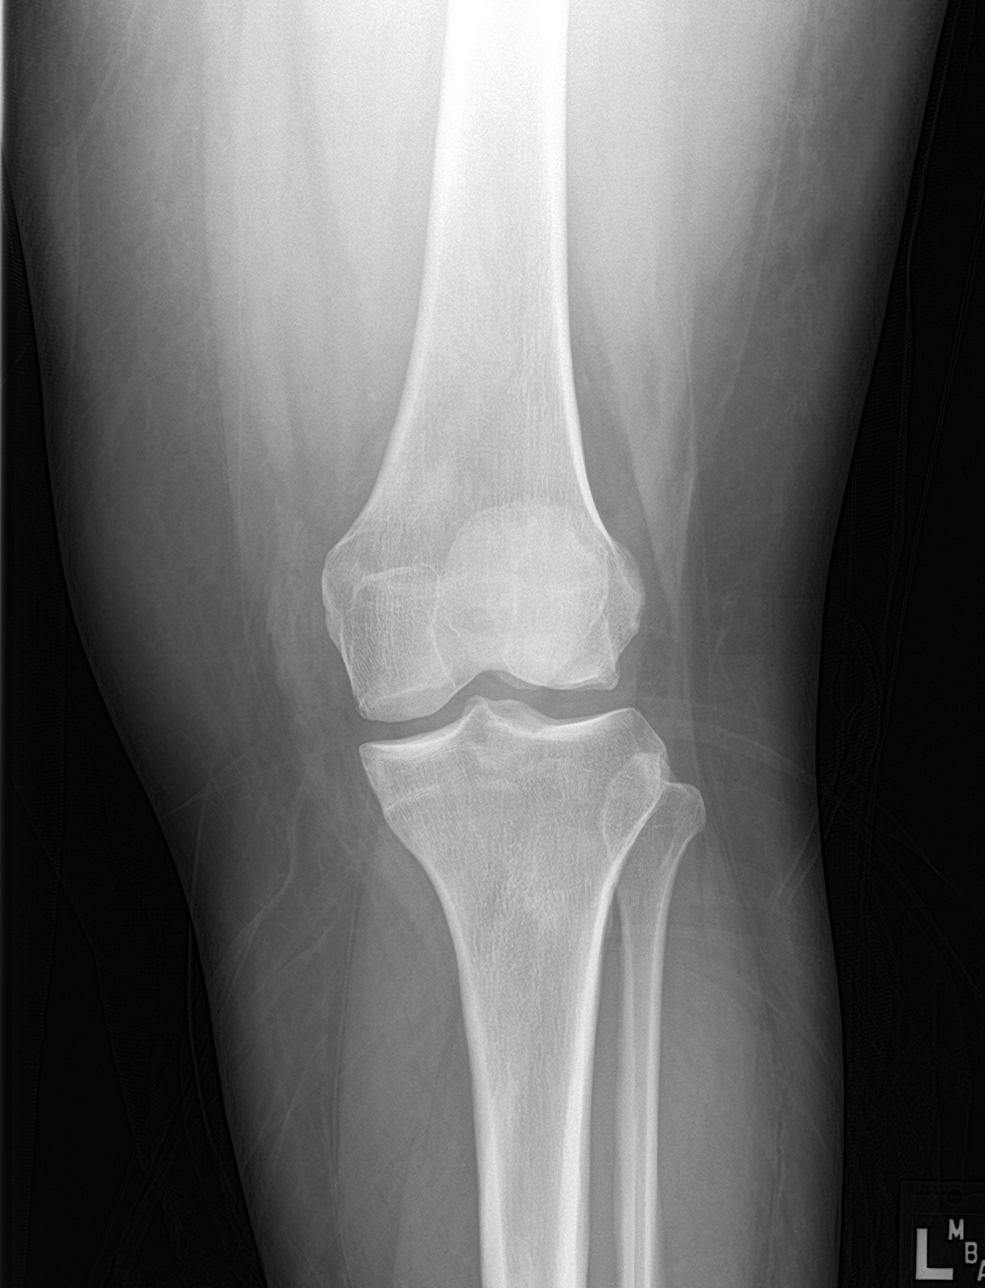

[knee lat]
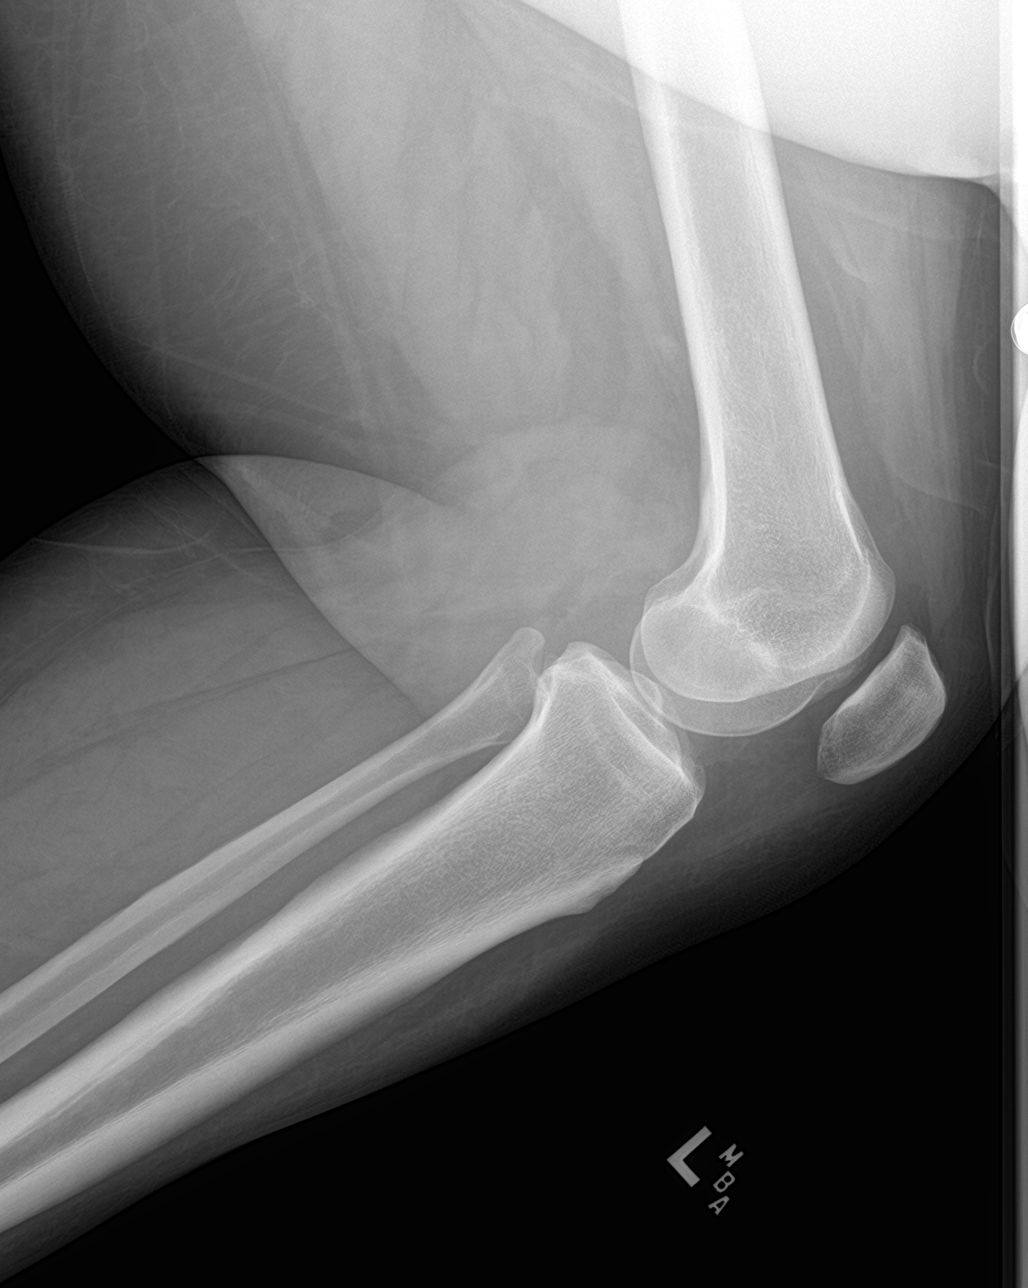

[knee obl (1 of 2)]
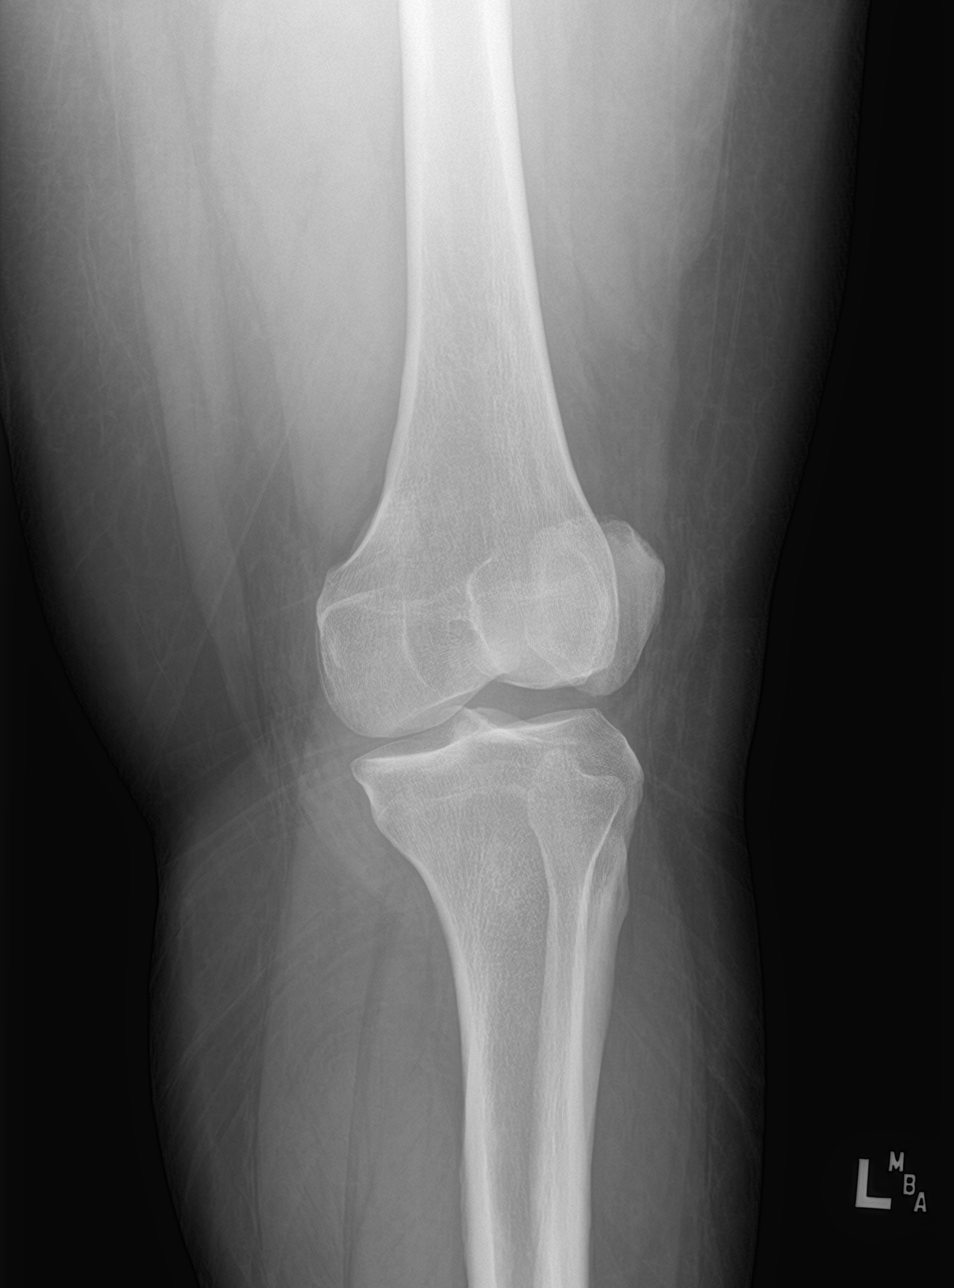

[knee obl (2 of 2)]
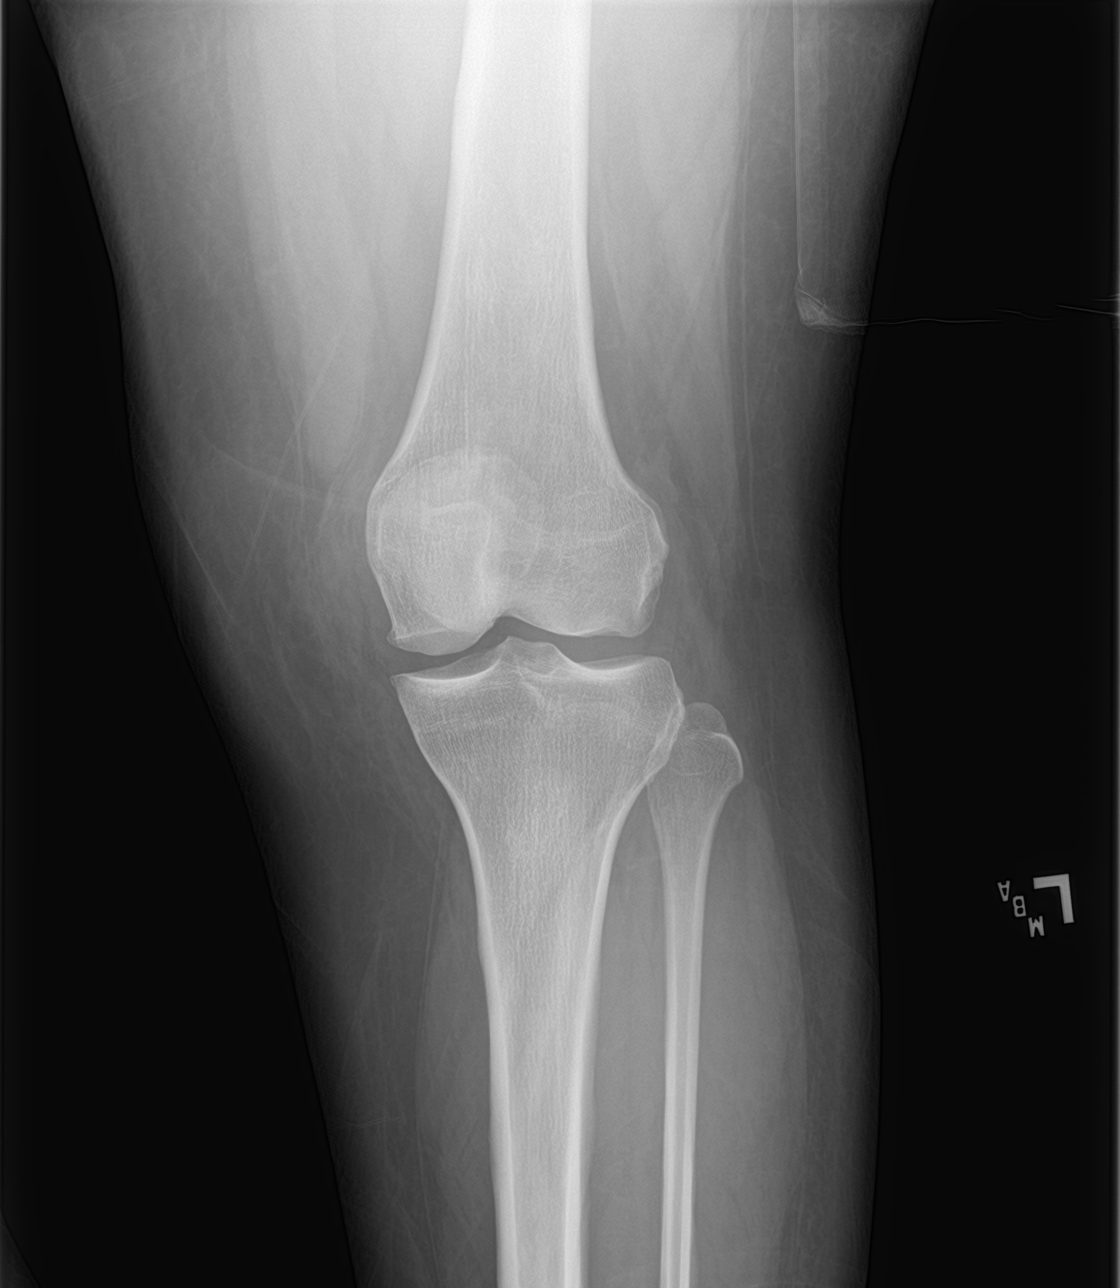

[4 of 4 positions shown; findings below may reference images not displayed]

FINDINGS: No evidence of fracture, dislocation, or joint effusion. No evidence
of arthropathy or other focal bone abnormality. Soft tissues are
unremarkable.
IMPRESSION: Negative.

## 2020-08-06 MED ORDER — IBUPROFEN 400 MG PO TABS
600.0000 mg | ORAL_TABLET | Freq: Once | ORAL | Status: AC
  Administered 2020-08-06: 12:00:00 600 mg via ORAL
  Filled 2020-08-06: qty 1

## 2020-08-06 MED ORDER — ACETAMINOPHEN 500 MG PO TABS
1000.0000 mg | ORAL_TABLET | Freq: Once | ORAL | Status: AC
  Administered 2020-08-06: 12:00:00 1000 mg via ORAL
  Filled 2020-08-06: qty 2

## 2020-08-06 NOTE — ED Provider Notes (Signed)
[facility] EMERGENCY DEPARTMENT Provider Note   CSN: [id number] Arrival date & time: 08/06/20  2458     History Chief Complaint  Patient presents with  . Knee Pain    Cindy Phillips is a 29 y.o. female.  Cindy Phillips is a 29 y.o. female with a history of diabetes, obesity, bipolar disorder, who presents to the emergency department for evaluation of left knee pain.  She reports yesterday she was walking and tripped and fell landing on her left knee.  She reports since then she has had constant pain primarily across the front of the knee that has made it difficult for her to bear weight.  She also reports some pain in the back of her knee, she thinks it has become a bit swollen it hurts worse with movement or weightbearing.  No pain at the ankle or hip.  No wounds.  No other injuries from fall.  No numbness tingling or weakness.  Took some Tylenol yesterday with a bit of improvement but has not tried anything for pain today.  No other aggravating or alleviating factors.        Past Medical History:  Diagnosis Date  . Bipolar 1 disorder ([place])   . Bowel incontinence   . Depression   . Diabetes mellitus without complication ([place])   . Mental disability     Patient Active Problem List   Diagnosis Date Noted  . Pain in left elbow 07/27/2020  . Prediabetes 02/03/2019  . Obstructive sleep apnea 04/23/2018  . Other fatigue 11/26/2017  . Shortness of breath on exertion 11/26/2017  . Type 2 diabetes mellitus without complication, without long-term current use of insulin ([place]) 11/26/2017  . Other hyperlipidemia 11/26/2017  . Vitamin D deficiency 11/26/2017  . Schizoaffective disorder, bipolar type ([place]) 09/14/2014  . Intellectual disability 09/10/2014    Past Surgical History:  Procedure Laterality Date  . FOOT SURGERY       OB History    Gravida  0   Para  0   Term  0   Preterm  0   AB  0   Living  0     SAB  0   IAB  0   Ectopic  0    Multiple  0   Live Births  0           Family History  Problem Relation Age of Onset  . High blood pressure Mother   . Anxiety disorder Mother   . Diabetes Maternal Grandmother     Social History   Tobacco Use  . Smoking status: Never Smoker  . Smokeless tobacco: Never Used  Vaping Use  . Vaping Use: Never used  Substance Use Topics  . Alcohol use: No  . Drug use: No    Home Medications Prior to Admission medications   Medication Sig Start Date End Date Taking? Authorizing Provider  atorvastatin (LIPITOR) 10 MG tablet Take 10 mg by mouth daily.    [provider]  baclofen (LIORESAL) 10 MG tablet Take 10 mg by mouth 3 (three) times daily.    [provider]  hydrOXYzine (ATARAX/VISTARIL) 50 MG tablet Take 1 tablet (50 mg total) by mouth every 4 (four) hours as needed for anxiety. 09/14/14   Lindell Spar I, NP  lamoTRIgine (LAMICTAL) 25 MG tablet Take 1 tablet (25 mg total) by mouth daily. For mood stabilization 09/14/14   Lindell Spar I, NP  medroxyPROGESTERone (DEPO-PROVERA) 150 MG/ML injection Inject 1  mL (150 mg total) into the muscle every 3 (three) months. For prevention of pregnancy 09/14/14   Lindell Spar I, NP  metFORMIN (GLUCOPHAGE) 500 MG tablet Take one tablet by mouth in the morning and one tablet by mouth in the evening 03/21/20   Dennard Nip D, MD  OLANZapine zydis (ZYPREXA) 15 MG disintegrating tablet Take 1 tablet (15 mg total) by mouth at bedtime. For mood control 09/14/14   Lindell Spar I, NP  ondansetron (ZOFRAN ODT) 4 MG disintegrating tablet Take 1 tablet (4 mg total) by mouth every 8 (eight) hours as needed. 05/14/19   McDonald, Mia A, PA-C  polyethylene glycol powder (GLYCOLAX/MIRALAX) powder Take 1 Container by mouth once.    [provider]  sodium chloride (OCEAN) 0.65 % SOLN nasal spray Place 1 spray into both nostrils as needed for congestion. 09/14/14   Lindell Spar I, NP  Vitamin D, Ergocalciferol, (DRISDOL) 1.25 MG  (50000 UNIT) CAPS capsule Take 1 capsule (50,000 Units total) by mouth every 3 (three) days. 03/22/20   Dennard Nip D, MD  zonisamide (ZONEGRAN) 25 MG capsule Take 25 mg by mouth daily.    [provider]    Allergies    Patient has no known allergies.  Review of Systems   Review of Systems  Constitutional: Negative for chills and fever.  Musculoskeletal: Positive for arthralgias and joint swelling. Negative for back pain and neck pain.  Skin: Negative for color change, rash and wound.  Neurological: Negative for weakness and numbness.    Physical Exam Updated Vital Signs BP (!) 148/89 (BP Location: Left Arm)   Pulse (!) 102   Temp 98.3 F (36.8 C) (Oral)   Resp 14   SpO2 98%   Physical Exam Vitals and nursing note reviewed.  Constitutional:      General: She is not in acute distress.    Appearance: Normal appearance. She is well-developed and well-nourished. She is obese. She is not diaphoretic.     Comments: Well-appearing and in no distress   HENT:     Head: Normocephalic and atraumatic.  Eyes:     General:        Right eye: No discharge.        Left eye: No discharge.  Pulmonary:     Effort: Pulmonary effort is normal. No respiratory distress.  Musculoskeletal:        General: Tenderness present.     Comments: Tenderness to palpation over the anterior left knee, no appreciable swelling, overlying skin changes or significant deformity, patient is able to flex and extend the knee but this worsens her pain, she does have some increased pain with varus and valgus stress, but no significant joint laxity.  No pain at the hip or ankle.  2+ distal pulses, normal sensation and strength  Neurological:     Mental Status: She is alert and oriented to person, place, and time.     Coordination: Coordination normal.  Psychiatric:        Mood and Affect: Mood and affect and mood normal.        Behavior: Behavior normal.     ED Results / Procedures / Treatments    Labs (all labs ordered are listed, but only abnormal results are displayed) Labs Reviewed - No data to display  EKG None  Radiology DG Knee Complete 4 Views Left  Result Date: 08/06/2020 CLINICAL DATA:  Fall yesterday.  Left knee pain.  Initial encounter. EXAM: LEFT KNEE - COMPLETE 4+ VIEW  COMPARISON:  None. FINDINGS: No evidence of fracture, dislocation, or joint effusion. No evidence of arthropathy or other focal bone abnormality. Soft tissues are unremarkable. IMPRESSION: Negative. Electronically Signed   By: Marlaine Hind M.D.   On: 08/06/2020 10:33    Procedures Procedures (including critical care time)  Medications Ordered in ED Medications - No data to display  ED Course  I have reviewed the triage vital signs and the nursing notes.  Pertinent labs & imaging results that were available during my care of the patient were reviewed by me and considered in my medical decision making (see chart for details).    MDM Rules/Calculators/A&P                         29 year old female presents with left knee pain after she fell landing on the knee yesterday.  On exam the knee is tender over the anterior portion but there is no appreciable bony deformity, no significant joint effusion or overlying skin changes.  Lower extremity is neurovascularly intact.  X-ray unremarkable with no evidence of fracture, dislocation or effusion.  Will place patient in knee immobilizer and crutches as she is having difficulty bearing weight.  We will have her treat with NSAIDs, Tylenol, ice and elevation.  Orthopedic follow-up if symptoms not improving.  She expresses understanding and agreement.  Discharged home in good condition.  Final Clinical Impression(s) / ED Diagnoses Final diagnoses:  Acute pain of left knee  Fall, initial encounter    Rx / DC Orders ED Discharge Orders    None       Janet Berlin 08/06/20 1248    Isla Pence, MD 08/06/20 1507

## 2020-08-06 NOTE — ED Triage Notes (Signed)
Pt states she tripped and fell yesterday.  Reports L knee pain. CMS intact.

## 2020-08-06 NOTE — Progress Notes (Signed)
Orthopedic Tech Progress Note Patient Details:  Cindy Phillips 1990/11/08 [id number]  Ortho Devices Type of Ortho Device: Crutches,Knee Immobilizer Ortho Device/Splint Location: LLE Ortho Device/Splint Interventions: Ordered,Application,Adjustment   Post Interventions Patient Tolerated: Well Instructions Provided: Care of device,Adjustment of device,Poper ambulation with device   Cindy Phillips 08/06/2020, 1:25 PM

## 2020-08-06 NOTE — Discharge Instructions (Addendum)
Your x-ray is normal, likely soft tissue injury or sprain, use immobilizer and crutches as pain improves you can bear weight on the knee as tolerated, if symptoms are not improving after 1 week you should follow-up with your PCP and/or orthopedics.  Use ibuprofen 600 mg every 6 hours and Tylenol 1000 mg every 6 hours to help with pain, you should ice and elevate the knee.  If you develop significant swelling, numbness or weakness in the leg or other new concerning symptoms return for sooner reevaluation.

## 2020-08-09 ENCOUNTER — Other Ambulatory Visit: Payer: Self-pay | Admitting: Orthopedic Surgery

## 2020-08-09 DIAGNOSIS — M25562 Pain in left knee: Secondary | ICD-10-CM

## 2020-08-09 DIAGNOSIS — E1169 Type 2 diabetes mellitus with other specified complication: Secondary | ICD-10-CM | POA: Diagnosis not present

## 2020-08-19 DIAGNOSIS — E1165 Type 2 diabetes mellitus with hyperglycemia: Secondary | ICD-10-CM | POA: Diagnosis not present

## 2020-08-19 DIAGNOSIS — E785 Hyperlipidemia, unspecified: Secondary | ICD-10-CM | POA: Diagnosis not present

## 2020-08-23 DIAGNOSIS — M791 Myalgia, unspecified site: Secondary | ICD-10-CM | POA: Diagnosis not present

## 2020-08-23 DIAGNOSIS — G43719 Chronic migraine without aura, intractable, without status migrainosus: Secondary | ICD-10-CM | POA: Diagnosis not present

## 2020-08-23 DIAGNOSIS — M542 Cervicalgia: Secondary | ICD-10-CM | POA: Diagnosis not present

## 2020-08-24 DIAGNOSIS — M7672 Peroneal tendinitis, left leg: Secondary | ICD-10-CM | POA: Diagnosis not present

## 2020-08-24 DIAGNOSIS — M7752 Other enthesopathy of left foot: Secondary | ICD-10-CM | POA: Diagnosis not present

## 2020-08-24 DIAGNOSIS — M7989 Other specified soft tissue disorders: Secondary | ICD-10-CM | POA: Diagnosis not present

## 2020-08-25 ENCOUNTER — Ambulatory Visit
Admission: RE | Admit: 2020-08-25 | Discharge: 2020-08-25 | Disposition: A | Discharge status: Home / Self Care | Payer: Medicare Other | Source: Ambulatory Visit | Attending: Orthopaedic Surgery | Admitting: Orthopaedic Surgery

## 2020-08-25 DIAGNOSIS — M25522 Pain in left elbow: Secondary | ICD-10-CM

## 2020-08-25 IMAGING — MR MR ELBOW*L* W/O CM
6 series · 40 of 40 positions shown · non-contrast
Comparison: Radiographs from [DATE]

CLINICAL DATA: Chronic elbow pain medially.  Numbness.

EXAM:
MRI OF THE LEFT ELBOW WITHOUT CONTRAST
TECHNIQUE: Multiplanar, multisequence MR imaging of the elbow was performed. No
intravenous contrast was administered.

[Series 5: T1 · axial · left · 3.0mm · 0.50mm/px · z∈[+29,+128]mm · 10 of 34 slices shown (1 of 2)]
[im 1/34]
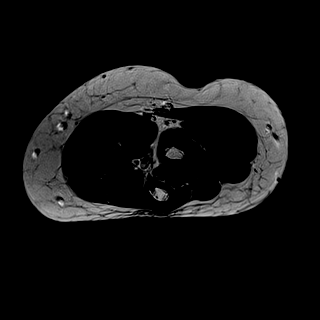
[im 4/34]
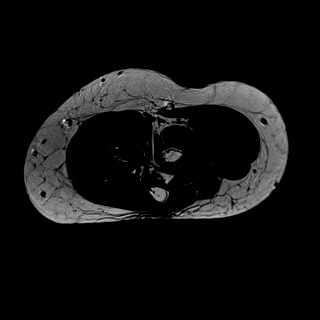
[im 8/34]
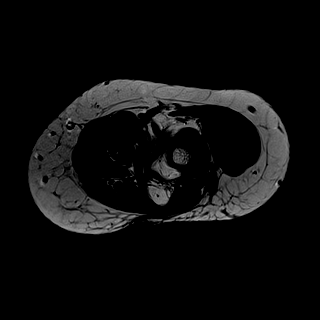
[im 12/34]
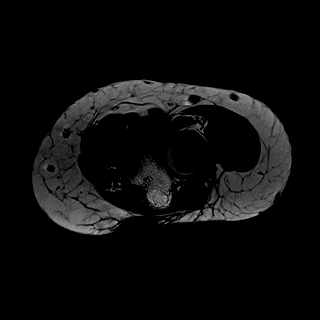
[im 15/34]
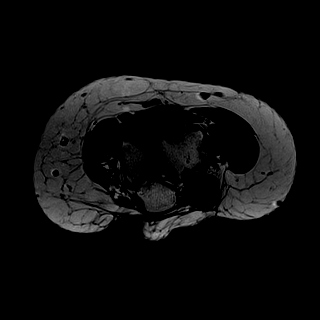
[im 19/34]
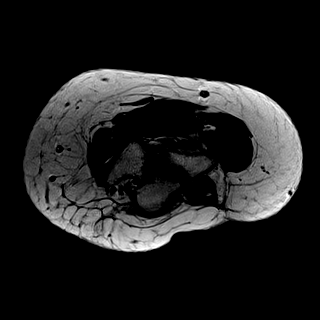
[im 23/34]
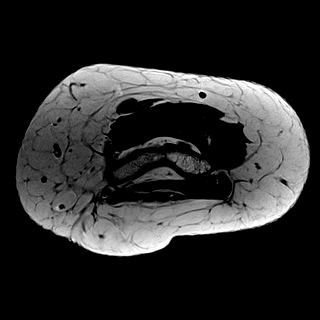
[im 26/34]
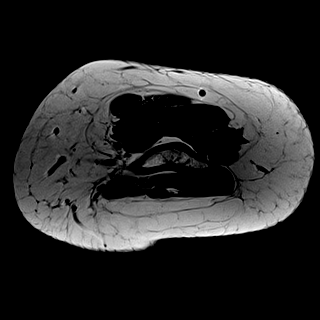
[im 30/34]
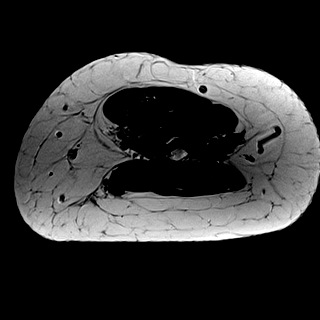
[im 34/34]
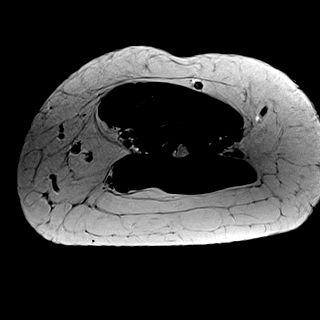

[Series 6: T2 fat-sat · axial · left · 3.0mm · 0.50mm/px · z∈[+29,+128]mm · 9 of 34 slices shown (1 of 2)]
[im 1/34]
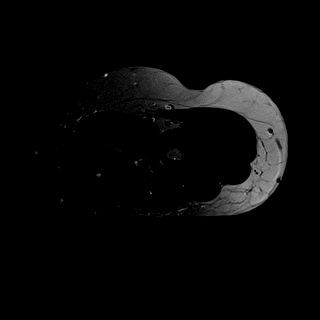
[im 5/34]
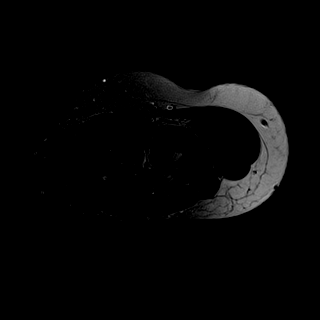
[im 9/34]
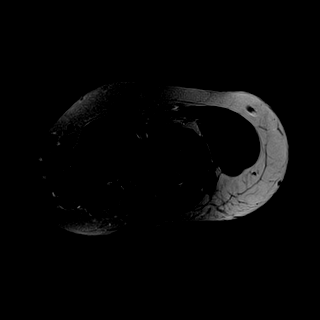
[im 13/34]
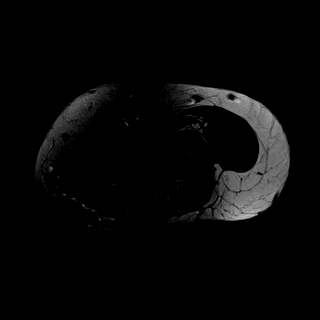
[im 17/34]
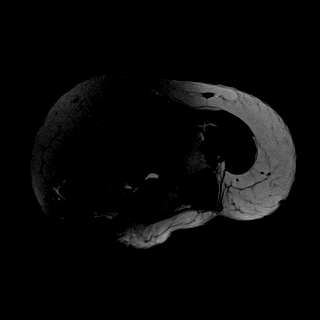
[im 21/34]
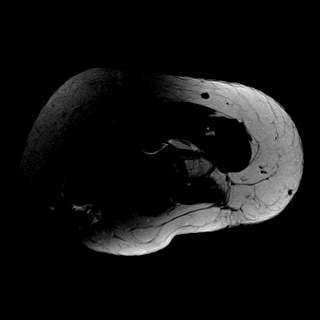
[im 25/34]
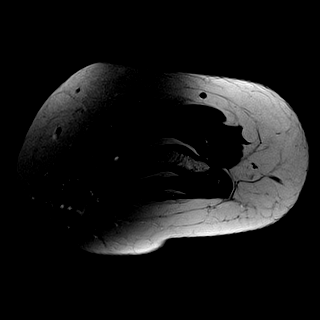
[im 29/34]
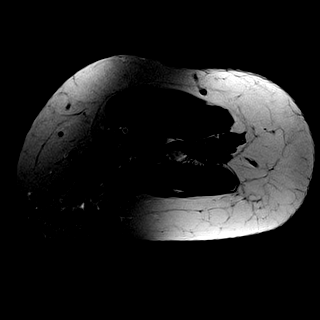
[im 34/34]
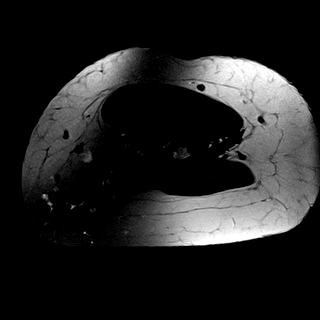

[Series 10: T2 fat-sat · coronal · left · 3.5mm · 0.44mm/px · 5 of 20 slices shown (2 of 2)]
[im 1/20]
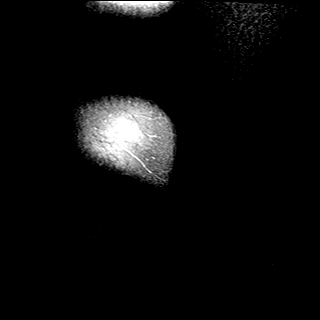
[im 5/20]
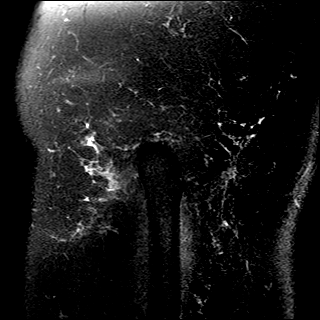
[im 10/20]
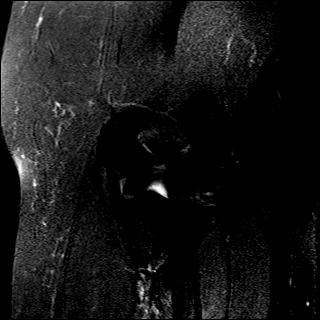
[im 15/20]
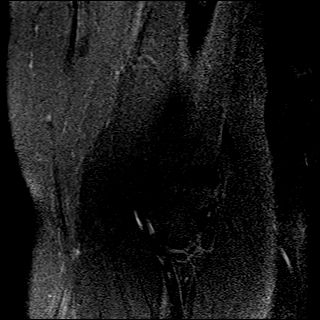
[im 20/20]
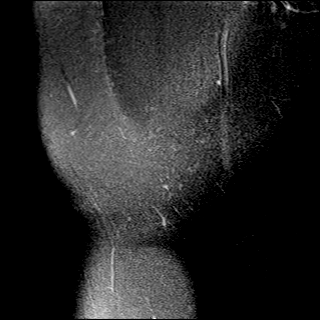

[Series 11: T1 · coronal · left · 3.5mm · 0.44mm/px · 5 of 20 slices shown (2 of 2)]
[im 1/20]
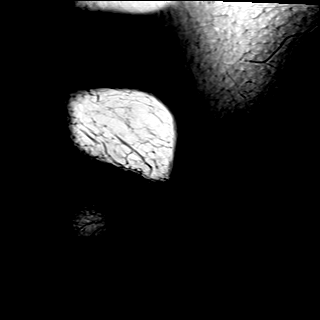
[im 5/20]
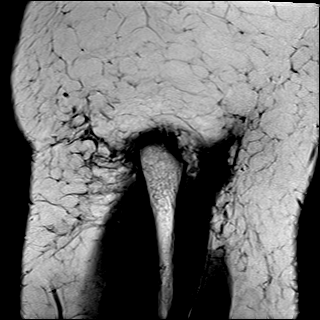
[im 10/20]
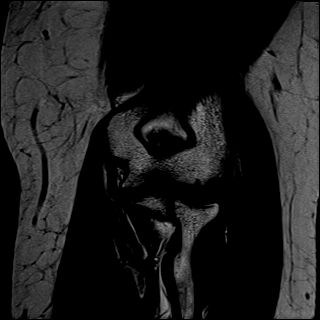
[im 15/20]
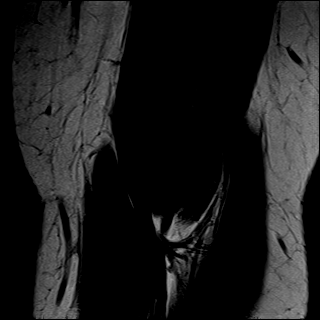
[im 20/20]
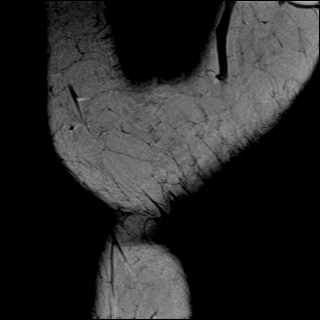

[Series 12: STIR · coronal · left · 3.5mm · 0.44mm/px · 5 of 20 slices shown]
[im 1/20]
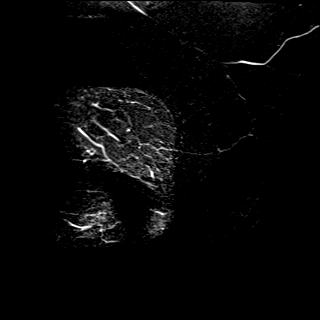
[im 5/20]
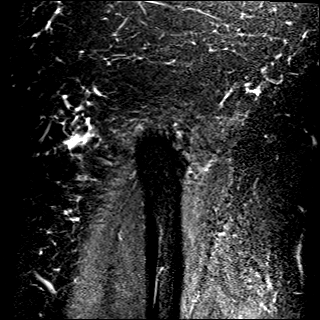
[im 10/20]
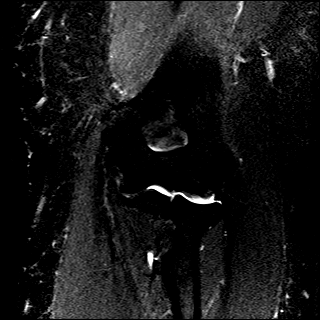
[im 15/20]
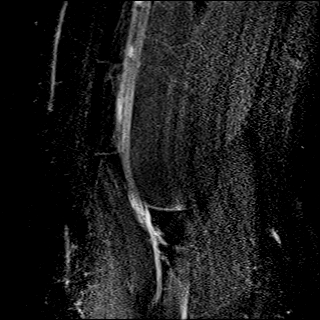
[im 20/20]
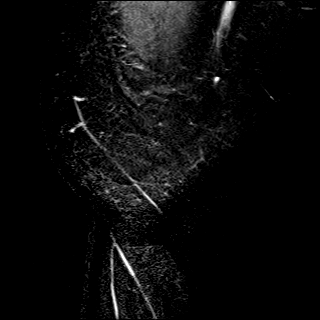

[Series 13: PD fat-sat · sagittal · left · 3.3mm · 0.44mm/px · 6 of 24 slices shown]
[im 1/24]
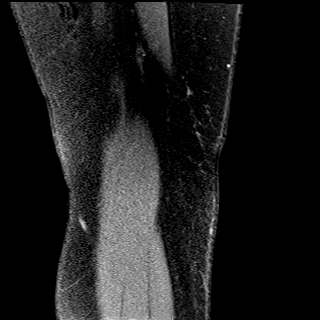
[im 5/24]
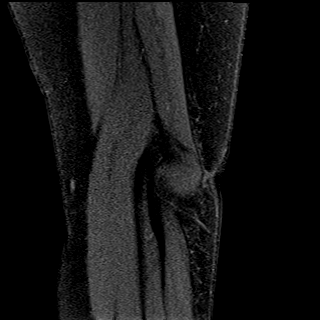
[im 10/24]
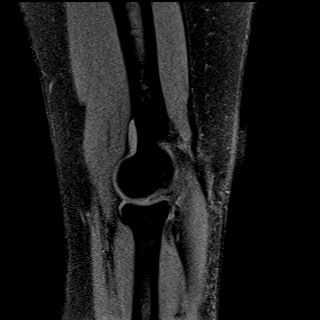
[im 14/24]
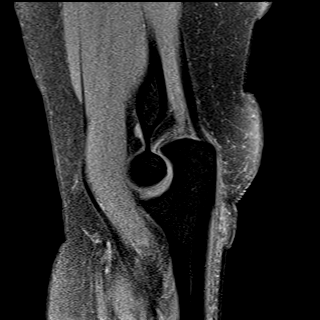
[im 19/24]
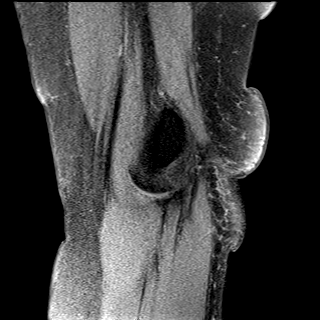
[im 24/24]
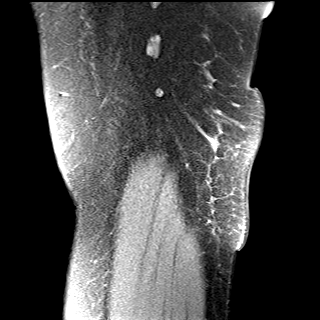

[40 of 40 positions shown; findings below may reference images not displayed]

FINDINGS: TENDONS

Common forearm flexor origin: Unremarkable

Common forearm extensor origin: Unremarkable

Biceps: Unremarkable

Triceps: Unremarkable

LIGAMENTS

Medial stabilizers: Unremarkable

Lateral stabilizers: Slightly thickened in indistinct appearance of
the proximal portion of the lateral ulnar collateral ligament, but
no overt tear is identified.

Cartilage: No chondral defect identified.

Joint: No effusion or free fragment identified.

Cubital tunnel: Somewhat lax cubital tunnel retinaculum could
conceivably predispose to snapping, but there is no expansion or
accentuated T2 signal in the ulnar nerve. Correlate with any ulnar
neuropathy.

Bones: No significant extra-articular osseous abnormalities
identified.
IMPRESSION: 1. Slightly thickened in indistinct appearance of the proximal
portion of the lateral ulnar collateral ligament, but no overt tear
is identified.
2. Mildly lax cubital tunnel retinaculum could conceivably
predispose to snapping, but there is no expansion or accentuated T2
signal in the ulnar nerve to indicate imaging findings of ulnar
neuropathy.

## 2020-08-26 ENCOUNTER — Telehealth: Payer: Self-pay | Admitting: Orthopaedic Surgery

## 2020-08-26 MED ORDER — DIAZEPAM 5 MG PO TABS: ORAL_TABLET | ORAL | 0 refills | Status: DC

## 2020-08-26 NOTE — Telephone Encounter (Signed)
Rx was called in for pt. Pt advised and stated she would call and r/s her MRI

## 2020-08-26 NOTE — Telephone Encounter (Signed)
Valium 5mg  30 min prior to exam

## 2020-08-26 NOTE — Telephone Encounter (Signed)
Patient called. Says she could not do the MRI. She would like something called in for her to take to calm her nerves.

## 2020-08-29 ENCOUNTER — Other Ambulatory Visit: Payer: Self-pay | Admitting: *Deleted

## 2020-08-29 NOTE — Patient Outreach (Signed)
[facility]) Care Management  08/29/2020  KAYLE PASSARELLI Feb 06, 1991 [id number]  RN Health Coach attempted follow up outreach call to patient.  Patient was unavailable. HIPPA compliance voicemail message left with return callback number.  Plan: RN will call patient again within 30 days.  [facility] Care Management [phone]

## 2020-09-01 ENCOUNTER — Ambulatory Visit
Admission: RE | Admit: 2020-09-01 | Discharge: 2020-09-01 | Disposition: A | Discharge status: Home / Self Care | Payer: Medicare Other | Source: Ambulatory Visit | Attending: Orthopedic Surgery | Admitting: Orthopedic Surgery

## 2020-09-01 ENCOUNTER — Other Ambulatory Visit: Payer: Self-pay

## 2020-09-01 DIAGNOSIS — M25562 Pain in left knee: Secondary | ICD-10-CM | POA: Diagnosis not present

## 2020-09-01 IMAGING — MR MR KNEE*L* W/O CM
4 of 7 series · 22 of 40 positions shown · non-contrast
Comparison: X-ray [DATE], MRI [DATE]

CLINICAL DATA: Left knee pain after fall 1 month ago

EXAM:
MRI OF THE LEFT KNEE WITHOUT CONTRAST
TECHNIQUE: Multiplanar, multisequence MR imaging of the knee was performed. No
intravenous contrast was administered.

[Series 4: T2 fat-sat · coronal · 4.0mm · 0.59mm/px · 6 of 26 slices shown (1 of 2)]
[im 1/26]
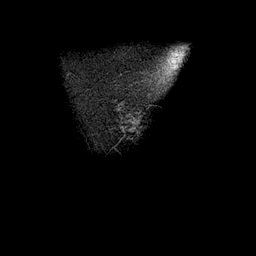
[im 6/26]
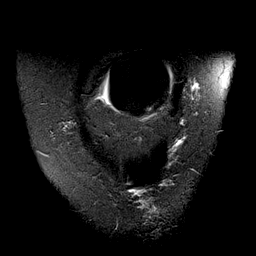
[im 11/26]
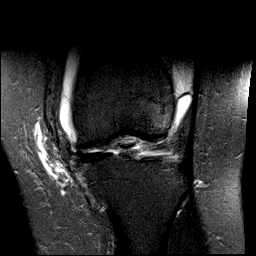
[im 16/26]
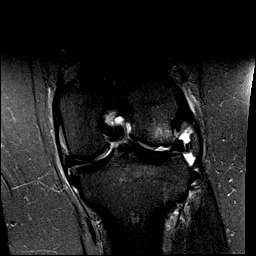
[im 21/26]
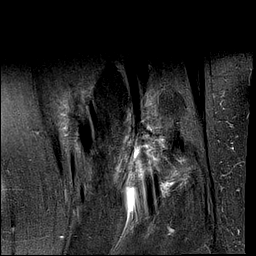
[im 26/26]
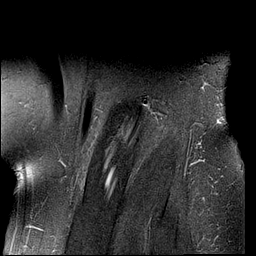

[Series 5: T1 · coronal · 4.0mm · 0.29mm/px · 4 of 26 slices shown]
[im 1/26]
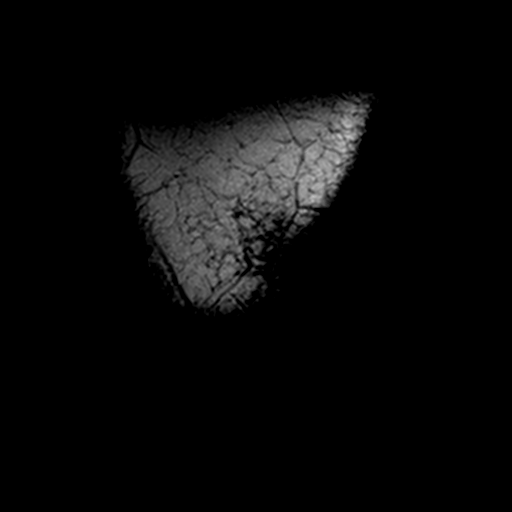
[im 6/26]
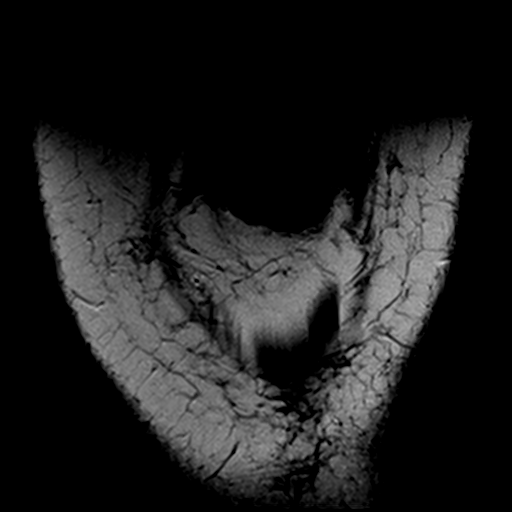
[im 16/26]
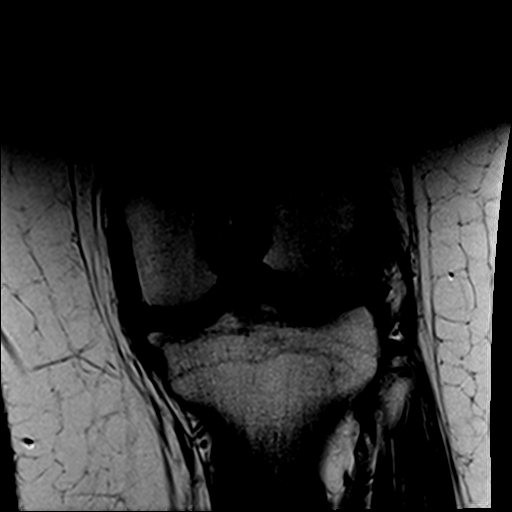
[im 26/26]
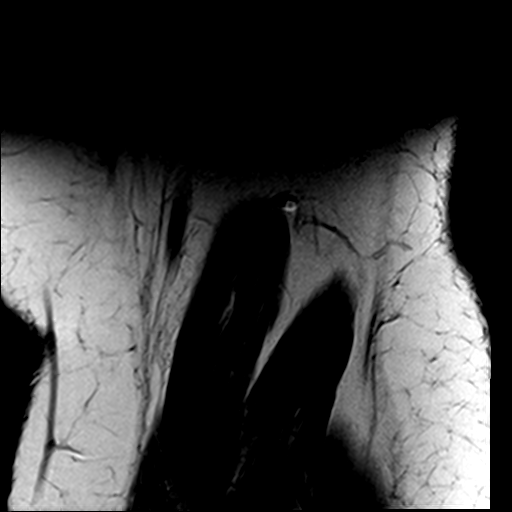

[Series 7: PD fat-sat · sagittal · 3.0mm · 0.29mm/px · 6 of 28 slices shown]
[im 1/28]
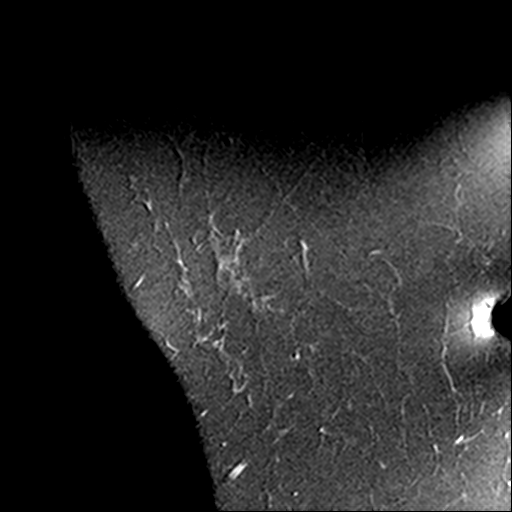
[im 6/28]
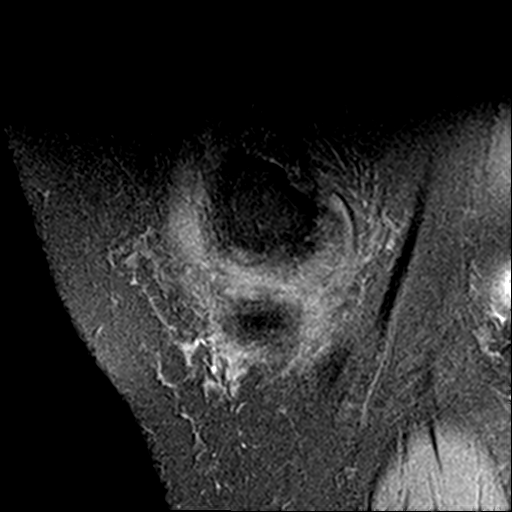
[im 11/28]
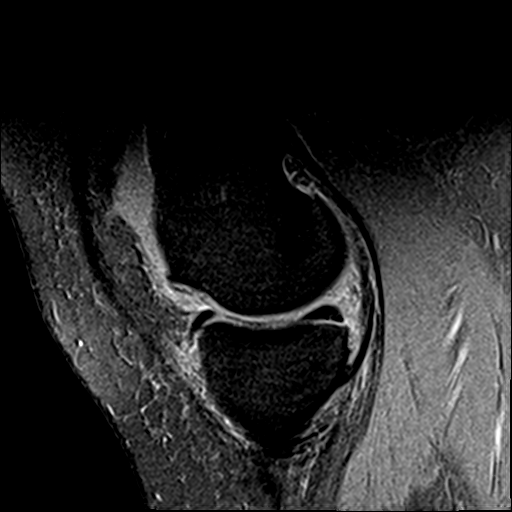
[im 17/28]
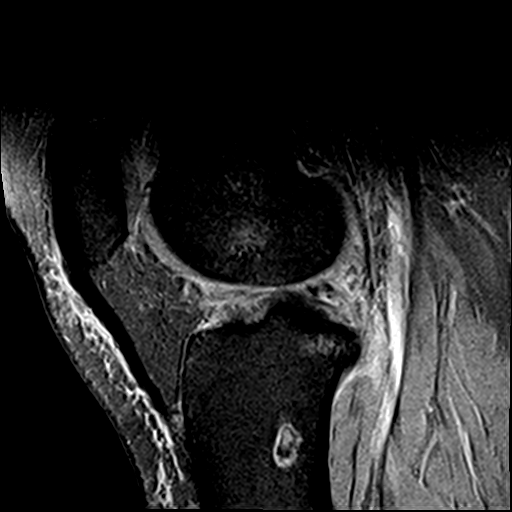
[im 22/28]
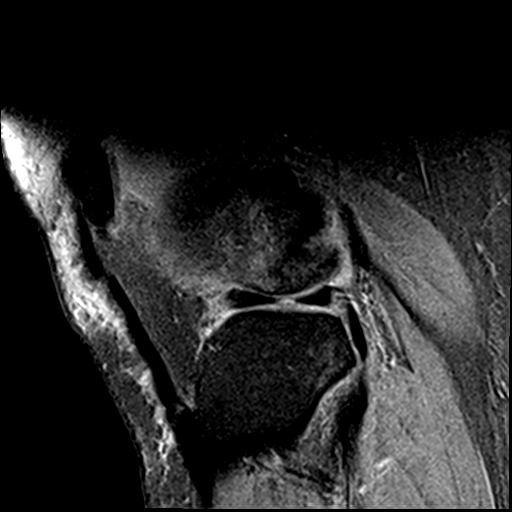
[im 28/28]
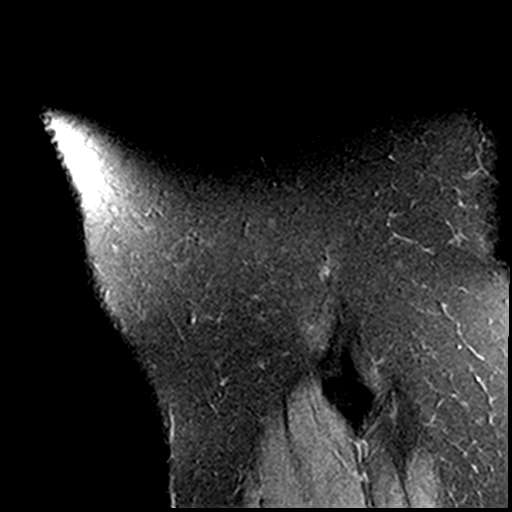

[Series 8: T2 fat-sat · sagittal · 3.0mm · 0.29mm/px · 6 of 28 slices shown (2 of 2)]
[im 1/28]
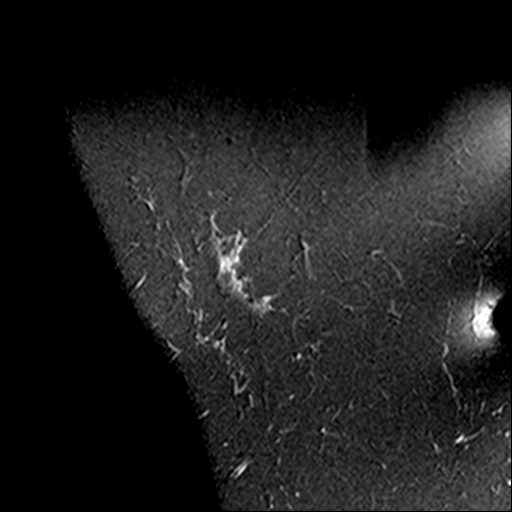
[im 6/28]
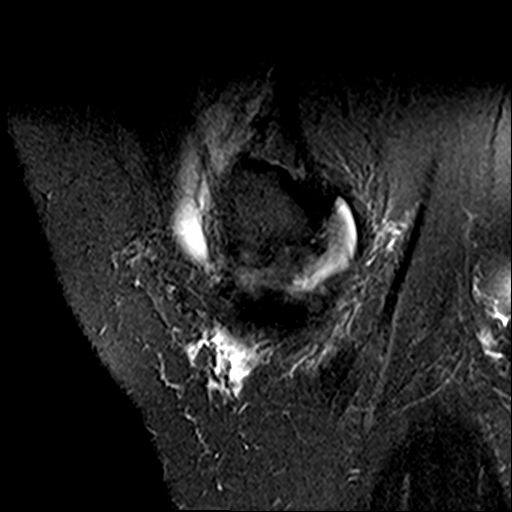
[im 11/28]
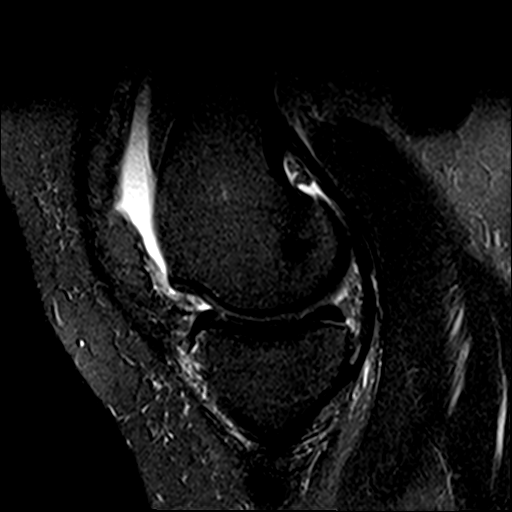
[im 17/28]
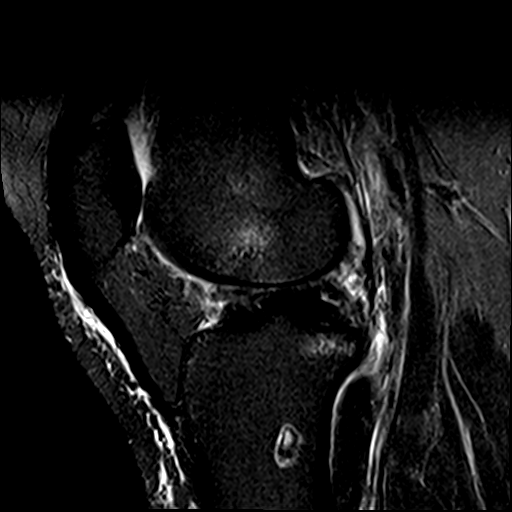
[im 22/28]
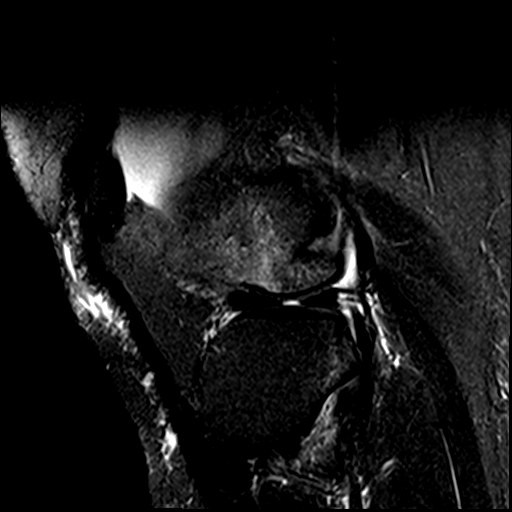
[im 28/28]
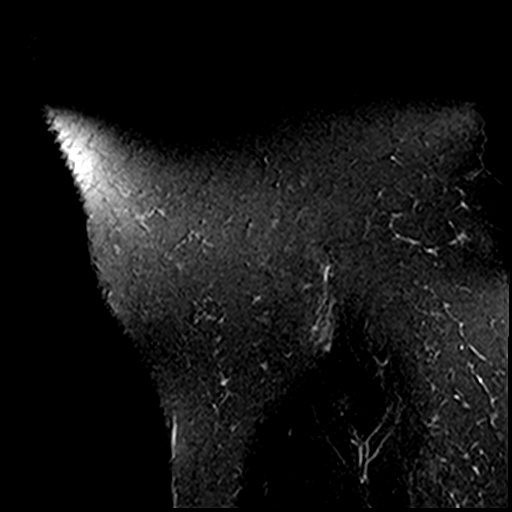

[22 of 40 positions shown; findings below may reference images not displayed]

FINDINGS: MENISCI

Medial meniscus: Irregular oblique tear of the medial meniscal
posterior horn extending to the inferior articular surface (series
7, images 9-11). Anterior and posterior root attachments intact.

Lateral meniscus: Complete radial tear/avulsion of the posterior
root attachment of the lateral meniscus.

LIGAMENTS

Cruciates:  Complete ACL rupture.  Intact PCL.

Collaterals: Intact MCL with periligamentous edema. Complete or
near-complete avulsion of the fibular collateral ligament from its
fibular attachment (series 4, images 9-10). IT band and biceps
femoris attachments are intact.

CARTILAGE

Patellofemoral: Suspect partial thickness cartilage defect along the
inferior aspect of the lateral patellar facet (series 3, image 12)

Medial: Osteochondral impaction injury along the anterior aspect of
the medial femoral condyle with subtle chondral surface
irregularity, however no displaced cartilage defect (series 8, image
12).

Lateral: Osteochondral impaction injury of the lateral femoral
condyle with multiple irregular partial and full-thickness cartilage
defects largest measures approximately 5 mm (series 4, image 11;
series 8, image 21). Additional osteochondral impaction injury of
the posterior aspect of the lateral tibial plateau without displaced
cartilage defect.

Joint: Moderate-sized knee joint effusion. Soft tissue edema within
the intercondylar notch. Fat pads within normal limits.

Popliteal Fossa:  No Baker cyst. Intact popliteus tendon.

Extensor Mechanism: Intact quadriceps tendon and patellar tendon.
Distal patellar tendinosis.

Bones: Osteochondral impaction injuries/bone contusions most
pronounced at the lateral femoral condyle and posterior aspect of
the lateral tibial plateau, as described above. Incidental note of a
12 mm bone infarct within the proximal tibial metaphysis. No
suspicious bone lesion.

Other: Mild subcutaneous edema. No soft tissue fluid collection or
hematoma.
IMPRESSION: 1. Complete ACL rupture.
2. Complete radial tear/avulsion of the posterior root attachment of
the lateral meniscus.
3. Grade 3 injury of the fibular collateral ligament.
4. Grade 1 MCL sprain.
5. Irregular oblique tear of the medial meniscal posterior horn.
6. Pivot-shift osteochondral impaction injuries of the lateral
femoral condyle and posterior aspect of the lateral tibial plateau.
7. Moderate-sized knee joint effusion.

## 2020-09-07 ENCOUNTER — Ambulatory Visit: Payer: Medicare Other | Admitting: Orthopaedic Surgery

## 2020-09-12 DIAGNOSIS — M25562 Pain in left knee: Secondary | ICD-10-CM | POA: Diagnosis not present

## 2020-09-16 ENCOUNTER — Other Ambulatory Visit: Payer: Self-pay

## 2020-09-16 ENCOUNTER — Ambulatory Visit
Admission: RE | Admit: 2020-09-16 | Discharge: 2020-09-16 | Disposition: A | Discharge status: Home / Self Care | Payer: Medicare Other | Source: Ambulatory Visit | Attending: Orthopaedic Surgery | Admitting: Orthopaedic Surgery

## 2020-09-16 DIAGNOSIS — R200 Anesthesia of skin: Secondary | ICD-10-CM | POA: Diagnosis not present

## 2020-09-16 DIAGNOSIS — M25522 Pain in left elbow: Secondary | ICD-10-CM | POA: Diagnosis not present

## 2020-09-16 DIAGNOSIS — G629 Polyneuropathy, unspecified: Secondary | ICD-10-CM | POA: Diagnosis not present

## 2020-09-16 DIAGNOSIS — G8929 Other chronic pain: Secondary | ICD-10-CM | POA: Diagnosis not present

## 2020-09-19 DIAGNOSIS — E785 Hyperlipidemia, unspecified: Secondary | ICD-10-CM | POA: Diagnosis not present

## 2020-09-19 DIAGNOSIS — Z7189 Other specified counseling: Secondary | ICD-10-CM | POA: Diagnosis not present

## 2020-09-19 DIAGNOSIS — E1165 Type 2 diabetes mellitus with hyperglycemia: Secondary | ICD-10-CM | POA: Diagnosis not present

## 2020-09-19 DIAGNOSIS — M25562 Pain in left knee: Secondary | ICD-10-CM | POA: Diagnosis not present

## 2020-09-20 DIAGNOSIS — M25562 Pain in left knee: Secondary | ICD-10-CM | POA: Diagnosis not present

## 2020-09-21 DIAGNOSIS — G43719 Chronic migraine without aura, intractable, without status migrainosus: Secondary | ICD-10-CM | POA: Diagnosis not present

## 2020-09-21 DIAGNOSIS — M791 Myalgia, unspecified site: Secondary | ICD-10-CM | POA: Diagnosis not present

## 2020-09-21 DIAGNOSIS — M542 Cervicalgia: Secondary | ICD-10-CM | POA: Diagnosis not present

## 2020-09-22 ENCOUNTER — Other Ambulatory Visit: Payer: Self-pay

## 2020-09-22 ENCOUNTER — Encounter: Payer: Self-pay | Admitting: Orthopaedic Surgery

## 2020-09-22 ENCOUNTER — Ambulatory Visit (INDEPENDENT_AMBULATORY_CARE_PROVIDER_SITE_OTHER): Payer: Medicare Other | Admitting: Orthopaedic Surgery

## 2020-09-22 VITALS — Ht 71.0 in | Wt 306.0 lb

## 2020-09-22 DIAGNOSIS — M25522 Pain in left elbow: Secondary | ICD-10-CM

## 2020-09-22 NOTE — Progress Notes (Signed)
Office Visit Note   Patient: Cindy Phillips           Date of Birth: [birth year]           MRN: [id number] Visit Date: 09/22/2020              Requested by: Cindy Lei, MD [address] [place],  [place] [address] PCP: Cindy Lei, MD   Assessment & Plan: Visit Diagnoses:  1. Pain in left elbow     Plan: MRI of left elbow was performed on 09/16/2020.  The common forearm flexor and extensor origins were unremarkable.  Biceps was unremarkable as well as the triceps.  The medial stabilizers were unremarkable.  She has had some discomfort along the medial aspect of her elbow but nothing by MRI scan.  The lateral stabilizers reveal some slightly thickened indistinct appearance of the ulnar collateral ligament but that is not where she is uncomfortable.  There was no overt tear.  There are no cartilage defects there was no effusion.  There is a somewhat lax cubital tunnel retinaculum that could cause snapping but she was not having any of those symptoms nor does she have any pain over the ulnar nerve.  Cindy Phillips does experience some numbness on the dorsum of her hand when she sleeps but notes that that there is really no functional loss of her left elbow.  Discussed the findings with her.  I would not suggest any specific treatment.  In the interim she has had a fall and injured her left knee and is presently in a brace and probably will need ACL reconstruction with Cindy Phillips.  I will plan to see her back as needed.  She presently is not working but notes it is not related to her elbow.  She further notes that the elbow does not prevent her from working.  Follow-Up Instructions: Return if symptoms worsen or fail to improve.   Orders:  No orders of the defined types were placed in this encounter.  No orders of the defined types were placed in this encounter.     Procedures: No procedures performed   Clinical Data: No additional findings.   Subjective: Chief Complaint   Patient presents with  . Left Elbow - Follow-up    MRI review  Patient presents today for follow up on her left elbow. She had an MRI performed on 09/16/2020. She is here today for those results.  No significant changes in her history or with her elbow discomfort.  Does have some numbness in the dorsum of her hand when she lays on it.  No specific discomfort related to the ulnar nerve.  HPI  Review of Systems   Objective: Vital Signs: Ht 5\' 11"  (1.803 m)   Wt (!) 306 lb (138.8 kg)   BMI 42.68 kg/m   Physical Exam Constitutional:      Appearance: She is well-developed and well-nourished.  HENT:     Mouth/Throat:     Mouth: Oropharynx is clear and moist.  Eyes:     Extraocular Movements: EOM normal.     Pupils: Pupils are equal, round, and reactive to light.  Pulmonary:     Effort: Pulmonary effort is normal.  Skin:    General: Skin is warm and dry.  Neurological:     Mental Status: She is alert and oriented to person, place, and time.  Psychiatric:        Mood and Affect: Mood and affect normal.  Behavior: Behavior normal.     Ortho Exam awake alert and oriented x3.  Comfortable sitting.  Is not using a brace to the left knee related to her recent injury.  Full range of motion of her left elbow in pronation supination flexion extension.  No obvious instability.  No localized areas of tenderness over the medial lateral epicondyles.  Does not appear to have any opening related to the ulnar collateral ligament.  Negative Tinel's over the ulnar nerve.  Full grip and release.  No shoulder pain.  Skin intact  Specialty Comments:  No specialty comments available.  Imaging: No results found.   PMFS History: Patient Active Problem List   Diagnosis Date Noted  . Pain in left elbow 07/27/2020  . Prediabetes 02/03/2019  . Obstructive sleep apnea 04/23/2018  . Other fatigue 11/26/2017  . Shortness of breath on exertion 11/26/2017  . Type 2 diabetes mellitus without  complication, without long-term current use of insulin ([place]) 11/26/2017  . Other hyperlipidemia 11/26/2017  . Vitamin D deficiency 11/26/2017  . Schizoaffective disorder, bipolar type ([place]) 09/14/2014  . Intellectual disability 09/10/2014   Past Medical History:  Diagnosis Date  . Bipolar 1 disorder ([place])   . Bowel incontinence   . Depression   . Diabetes mellitus without complication ([place])   . Mental disability     Family History  Problem Relation Age of Onset  . High blood pressure Mother   . Anxiety disorder Mother   . Diabetes Maternal Grandmother     Past Surgical History:  Procedure Laterality Date  . FOOT SURGERY     Social History   Occupational History  . Occupation: Clinical research associate     Comment: [employer]   Tobacco Use  . Smoking status: Never Smoker  . Smokeless tobacco: Never Used  Vaping Use  . Vaping Use: Never used  Substance and Sexual Activity  . Alcohol use: No  . Drug use: No  . Sexual activity: Not Currently

## 2020-09-28 DIAGNOSIS — M7989 Other specified soft tissue disorders: Secondary | ICD-10-CM | POA: Diagnosis not present

## 2020-09-28 DIAGNOSIS — M7672 Peroneal tendinitis, left leg: Secondary | ICD-10-CM | POA: Diagnosis not present

## 2020-09-30 ENCOUNTER — Ambulatory Visit: Payer: Self-pay | Admitting: *Deleted

## 2020-10-07 DIAGNOSIS — H16142 Punctate keratitis, left eye: Secondary | ICD-10-CM | POA: Diagnosis not present

## 2020-10-07 DIAGNOSIS — H1045 Other chronic allergic conjunctivitis: Secondary | ICD-10-CM | POA: Diagnosis not present

## 2020-10-07 DIAGNOSIS — E119 Type 2 diabetes mellitus without complications: Secondary | ICD-10-CM | POA: Diagnosis not present

## 2020-10-07 DIAGNOSIS — H5213 Myopia, bilateral: Secondary | ICD-10-CM | POA: Diagnosis not present

## 2020-10-07 DIAGNOSIS — H52223 Regular astigmatism, bilateral: Secondary | ICD-10-CM | POA: Diagnosis not present

## 2020-10-07 DIAGNOSIS — H53003 Unspecified amblyopia, bilateral: Secondary | ICD-10-CM | POA: Diagnosis not present

## 2020-10-12 DIAGNOSIS — S83282A Other tear of lateral meniscus, current injury, left knee, initial encounter: Secondary | ICD-10-CM | POA: Diagnosis not present

## 2020-10-12 DIAGNOSIS — M25362 Other instability, left knee: Secondary | ICD-10-CM | POA: Diagnosis not present

## 2020-10-12 DIAGNOSIS — M1712 Unilateral primary osteoarthritis, left knee: Secondary | ICD-10-CM | POA: Diagnosis not present

## 2020-10-12 DIAGNOSIS — S83272A Complex tear of lateral meniscus, current injury, left knee, initial encounter: Secondary | ICD-10-CM | POA: Diagnosis not present

## 2020-10-12 DIAGNOSIS — G8918 Other acute postprocedural pain: Secondary | ICD-10-CM | POA: Diagnosis not present

## 2020-10-12 DIAGNOSIS — S83512A Sprain of anterior cruciate ligament of left knee, initial encounter: Secondary | ICD-10-CM | POA: Diagnosis not present

## 2020-10-12 HISTORY — PX: KNEE ARTHROSCOPY W/ ACL RECONSTRUCTION: SHX1858

## 2020-10-17 ENCOUNTER — Other Ambulatory Visit: Payer: Self-pay | Admitting: *Deleted

## 2020-10-17 ENCOUNTER — Other Ambulatory Visit: Payer: Self-pay

## 2020-10-17 ENCOUNTER — Ambulatory Visit: Payer: Medicare Other | Attending: Orthopedic Surgery | Admitting: Physical Therapy

## 2020-10-17 ENCOUNTER — Encounter: Payer: Self-pay | Admitting: Physical Therapy

## 2020-10-17 DIAGNOSIS — M25562 Pain in left knee: Secondary | ICD-10-CM | POA: Diagnosis not present

## 2020-10-17 DIAGNOSIS — R2689 Other abnormalities of gait and mobility: Secondary | ICD-10-CM | POA: Diagnosis not present

## 2020-10-17 DIAGNOSIS — R600 Localized edema: Secondary | ICD-10-CM | POA: Insufficient documentation

## 2020-10-17 DIAGNOSIS — E785 Hyperlipidemia, unspecified: Secondary | ICD-10-CM | POA: Diagnosis not present

## 2020-10-17 DIAGNOSIS — M25662 Stiffness of left knee, not elsewhere classified: Secondary | ICD-10-CM | POA: Diagnosis not present

## 2020-10-17 DIAGNOSIS — E1165 Type 2 diabetes mellitus with hyperglycemia: Secondary | ICD-10-CM | POA: Diagnosis not present

## 2020-10-17 DIAGNOSIS — G8929 Other chronic pain: Secondary | ICD-10-CM | POA: Insufficient documentation

## 2020-10-17 NOTE — Patient Instructions (Signed)
Goals Addressed            This Visit's Progress   . (THN)Eat Healthy   On track    Timeframe:  Long-Range Goal Priority:  Medium Start Date:      34742595                       Expected End Date:  63875643                    Follow Up Date  32951884   - set goal weight - drink 6 to 8 glasses of water each day - fill half of plate with vegetables - limit fast food meals to no more than 1 per week - manage portion size - set a realistic goal    Why is this important?   When you are ready to manage your nutrition or weight, having a plan and setting goals will help.  Taking small steps to change how you eat and exercise is a good place to start.    Notes:  Patient is attending a weight loss clinic    . [facility] and Keep All Appointments   On track    Timeframe:  Long-Range Goal Priority:  Medium Start Date:   16606301                          Expected End Date:   60109323                   Follow Up Date [id number]   - call to cancel if needed - keep a calendar with prescription refill dates - keep a calendar with appointment dates    Why is this important?   Part of staying healthy is seeing the doctor for follow-up care.  If you forget your appointments, there are some things you can do to stay on track.    Notes:     . (THN)Monitor and Manage My Blood Sugar   On track    Timeframe:  Long-Range Goal Priority:  High Start Date:    54270623                         Expected End Date:  [id number]   - check blood sugar at prescribed times - check blood sugar if I feel it is too high or too low - take the blood sugar log to all doctor visits - take the blood sugar meter to all doctor visits    Why is this important?   Checking your blood sugar at home helps to keep it from getting very high or very low.  Writing the results in a diary or log helps the doctor know how to care for you.  Your blood sugar log should have the time, date and the results.  Also, write down  the amount of insulin or other medicine that you take.  Other information, like what you ate, exercise done and how you were feeling, will also be helpful.     Notes:  At this time the patient is not checking her blood sugars.RN is encouraging her to monitor    . [facility] Eye Exam       Timeframe:  Long-Range Goal Priority:  Medium Start Date:      61607371  Expected End Date:     91478295                 Follow Up Date 62130865   - keep appointment with eye doctor - schedule appointment with eye doctor    Why is this important?   Eye check-ups are important when you have diabetes.  Vision loss can be prevented.    Notes:     . (THN)Perform Foot Care   On track    Timeframe:  Long-Range Goal Priority:  Medium Start Date:  78469629                           Expected End Date:  52841324                    Follow Up Date 40102725   - check feet daily for cuts, sores or redness - trim toenails straight across - wash and dry feet carefully every day - wear comfortable, cotton socks - wear comfortable, well-fitting shoes    Why is this important?   Good foot care is very important when you have diabetes.  There are many things you can do to keep your feet healthy and catch a problem early.    Notes:  [id number] Last Podiatry [id number]    . (THN)Set My Target A1C   On track    Timeframe:  Long-Range Goal Priority:  Medium Start Date: 87564332                      Expected End Date:     95188416                 Follow Up Date 60630160   - set target A1C      Why is this important?   Your target A1C is decided together by you and your doctor.  It is based on several things like your age and other health issues.    Notes:  6.0 Patient has met her target A1C  Her A1C is currently 5.9

## 2020-10-17 NOTE — Patient Outreach (Signed)
[facility]) Care Management  [place]  10/17/2020   Cindy Phillips 08/14/1991 [id number]  RN Health Coach telephone call to patient.  Hipaa compliance verified. Per patient Her fasting blood sugar is 106. Patient A1C is 5.8. Per patient she is trying to eat healthy.  Patient ambulates with a rollator. Patient is going to physical therapy 2 x a week x 12 weeks. Patient uses a CPAP at night.Per patient she does need a booster shot.  Patient has agreed to further outreach calls.   Encounter Medications:  Outpatient Encounter Medications as of 10/17/2020  Medication Sig  . atorvastatin (LIPITOR) 10 MG tablet Take 10 mg by mouth daily.  . baclofen (LIORESAL) 10 MG tablet Take 10 mg by mouth 3 (three) times daily.  . diazepam (VALIUM) 5 MG tablet Take 1 tablet 30 minutes prior to MRI  . hydrOXYzine (ATARAX/VISTARIL) 50 MG tablet Take 1 tablet (50 mg total) by mouth every 4 (four) hours as needed for anxiety.  . lamoTRIgine (LAMICTAL) 25 MG tablet Take 1 tablet (25 mg total) by mouth daily. For mood stabilization  . medroxyPROGESTERone (DEPO-PROVERA) 150 MG/ML injection Inject 1 mL (150 mg total) into the muscle every 3 (three) months. For prevention of pregnancy  . metFORMIN (GLUCOPHAGE) 500 MG tablet Take one tablet by mouth in the morning and one tablet by mouth in the evening  . OLANZapine zydis (ZYPREXA) 15 MG disintegrating tablet Take 1 tablet (15 mg total) by mouth at bedtime. For mood control  . ondansetron (ZOFRAN ODT) 4 MG disintegrating tablet Take 1 tablet (4 mg total) by mouth every 8 (eight) hours as needed.  . polyethylene glycol powder (GLYCOLAX/MIRALAX) powder Take 1 Container by mouth once.  . sodium chloride (OCEAN) 0.65 % SOLN nasal spray Place 1 spray into both nostrils as needed for congestion.  . Vitamin D, Ergocalciferol, (DRISDOL) 1.25 MG (50000 UNIT) CAPS capsule Take 1 capsule (50,000 Units total) by mouth every 3 (three) days.  Marland Kitchen  zonisamide (ZONEGRAN) 25 MG capsule Take 25 mg by mouth daily.   No facility-administered encounter medications on file as of 10/17/2020.    Functional Status:  In your present state of health, do you have any difficulty performing the following activities: 01/20/2020  Hearing? N  Vision? N  Difficulty concentrating or making decisions? N  Walking or climbing stairs? Y  Comment patient is morbidly obese  Dressing or bathing? N  Preparing Food and eating ? N  Using the Toilet? N  In the past six months, have you accidently leaked urine? N  Do you have problems with loss of bowel control? N  Managing your Medications? N  Managing your Finances? N  Housekeeping or managing your Housekeeping? N  Some recent data might be hidden    Fall/Depression Screening: Fall Risk  10/12/2016 07/27/2016 05/16/2016  Falls in the past year? No No No   PHQ 2/9 Scores 01/21/2020 11/26/2017 10/12/2016 07/27/2016 05/16/2016  PHQ - 2 Score 2 2 0 1 1  PHQ- 9 Score 8 18 - - -  Exception Documentation - Medical reason - - -    Assessment:  Goals Addressed            This Visit's Progress   . (THN)Eat Healthy   On track    Timeframe:  Long-Range Goal Priority:  Medium Start Date:      32992426  Expected End Date:  56314970                    Follow Up Date  26378588   - set goal weight - drink 6 to 8 glasses of water each day - fill half of plate with vegetables - limit fast food meals to no more than 1 per week - manage portion size - set a realistic goal    Why is this important?   When you are ready to manage your nutrition or weight, having a plan and setting goals will help.  Taking small steps to change how you eat and exercise is a good place to start.    Notes:  Patient is attending a weight loss clinic    . [facility] and Keep All Appointments   On track    Timeframe:  Long-Range Goal Priority:  Medium Start Date:   50277412                          Expected End  Date:   87867672                   Follow Up Date 09470962   - call to cancel if needed - keep a calendar with prescription refill dates - keep a calendar with appointment dates    Why is this important?   Part of staying healthy is seeing the doctor for follow-up care.  If you forget your appointments, there are some things you can do to stay on track.    Notes:     . (THN)Monitor and Manage My Blood Sugar   On track    Timeframe:  Long-Range Goal Priority:  High Start Date:    83662947                         Expected End Date:  654650354   - check blood sugar at prescribed times - check blood sugar if I feel it is too high or too low - take the blood sugar log to all doctor visits - take the blood sugar meter to all doctor visits    Why is this important?   Checking your blood sugar at home helps to keep it from getting very high or very low.  Writing the results in a diary or log helps the doctor know how to care for you.  Your blood sugar log should have the time, date and the results.  Also, write down the amount of insulin or other medicine that you take.  Other information, like what you ate, exercise done and how you were feeling, will also be helpful.     Notes:  At this time the patient is not checking her blood sugars.RN is encouraging her to monitor    . [facility] Eye Exam       Timeframe:  Long-Range Goal Priority:  Medium Start Date:      65681275                       Expected End Date:     17001749                 Follow Up Date 44967591   - keep appointment with eye doctor - schedule appointment with eye doctor    Why is this important?   Eye check-ups are important when you have diabetes.  Vision  loss can be prevented.    Notes:     . (THN)Perform Foot Care   On track    Timeframe:  Long-Range Goal Priority:  Medium Start Date:  03159458                           Expected End Date:  59292446                    Follow Up Date 28638177   -  check feet daily for cuts, sores or redness - trim toenails straight across - wash and dry feet carefully every day - wear comfortable, cotton socks - wear comfortable, well-fitting shoes    Why is this important?   Good foot care is very important when you have diabetes.  There are many things you can do to keep your feet healthy and catch a problem early.    Notes:  [id number] Last Podiatry [id number]    . (THN)Set My Target A1C   On track    Timeframe:  Long-Range Goal Priority:  Medium Start Date: 19166060                      Expected End Date:     04599774                 Follow Up Date 14239532   - set target A1C      Why is this important?   Your target A1C is decided together by you and your doctor.  It is based on several things like your age and other health issues.    Notes:  6.0 Patient has met her target A1C  Her A1C is currently 5.9       Plan:  Follow-up:  Patient agrees to Care Plan and Follow-up. A1C is 5.8 Fasting blood sugar is 106 RN will send update assessment to PCP RN will follow up within the month of August  Shunte Senseney [place] Care Management [phone]

## 2020-10-17 NOTE — Therapy (Signed)
[place], [place], [address] Phone: [phone]   Fax:  [phone]  Physical Therapy Evaluation  Patient Details  Name: Cindy Phillips MRN: [id number] Date of Birth: 1991-05-19 Referring Provider (PT): Earlie Server, MD   Encounter Date: 10/17/2020   PT End of Session - 10/17/20 1145    Visit Number 1    Number of Visits 17    Date for PT Re-Evaluation 12/12/20    Authorization Type MCR: Kx mod 15th visit,  FOTO 6th and 10th visit    Progress Note Due on Visit 10    PT Start Time 1145    PT Stop Time 1230    PT Time Calculation (min) 45 min    Activity Tolerance Patient limited by pain    Behavior During Therapy [facility] for tasks assessed/performed           Past Medical History:  Diagnosis Date  . Bipolar 1 disorder ([place])   . Bowel incontinence   . Depression   . Diabetes mellitus without complication ([place])   . Mental disability     Past Surgical History:  Procedure Laterality Date  . FOOT SURGERY    . KNEE ARTHROSCOPY W/ ACL RECONSTRUCTION Left 10/12/2020    There were no vitals filed for this visit.    Subjective Assessment - 10/17/20 1155    Subjective pt is 30 y.o F s/p L ACL reconstruction on 10/12/2020 due after fall before Christmas. Since the surgery she reports coninuted pain and is ambulated with Rollator. she reports the pain has stayed the same since surgery.    How long can you sit comfortably? 10 -15 min    How long can you stand comfortably? 10 min with rollator    How long can you walk comfortably? 10 - 20 with rollator    Patient Stated Goals get the knee to heal well, decrease pain, increase the strength    Currently in Pain? Yes    Pain Score 10-Worst pain ever   pt declined going to the hospital. last took meds at 10am   Pain Location Knee    Pain Orientation Left    Pain Descriptors / Indicators Aching;Sore    Pain Type Chronic pain    Pain Onset More than a  month ago    Pain Frequency Intermittent    Aggravating Factors  standing, walking, sitting, any activity    Pain Relieving Factors medication    Effect of Pain on Daily Activities limited standing/ walking moving around.              [facility] PT Assessment - 10/17/20 1205      Assessment   Medical Diagnosis L ACL reconstruction    Referring Provider (PT) Earlie Server, MD    Onset Date/Surgical Date 10/12/20    Hand Dominance Right    Next MD Visit 10/20/2020    Prior Therapy yes      Precautions   Precautions None      Restrictions   Weight Bearing Restrictions No      Balance Screen   Has the patient fallen in the past 6 months Yes    How many times? 1    Has the patient had a decrease in activity level because of a fear of falling?  No    Is the patient reluctant to leave their home because of a fear of falling?  No      Home Environment   Living  Environment Private residence    Living Arrangements Parent    Available Help at Discharge Family    Type of [place] Access Level entry    [place] One level    Home Equipment Crutches   rollator     Prior Function   Level of Independence Independent with basic ADLs    Vocation Unemployed      Cognition   Overall Cognitive Status Within Functional Limits for tasks assessed      Observation/Other Assessments   Observations incision is covered with steri-strips, area appears to be healing well   knee wrapped in cotton, ace wrap and immobilizer   Focus on Therapeutic Outcomes (FOTO)  55% function  spare 70.4%   64% predicted     ROM / Strength   AROM / PROM / Strength AROM;Strength;PROM      AROM   AROM Assessment Site Knee    Right/Left Knee Right;Left    Right Knee Extension 0    Right Knee Flexion 108    Left Knee Extension 20    Left Knee Flexion 50      PROM   PROM Assessment Site Knee    Right/Left Knee Left    Left Knee Extension 14    Left Knee Flexion 58      Strength   Overall  Strength Comments L knee not assessed today    Strength Assessment Site Knee;Hip    Right/Left Hip Right;Left    Right/Left Knee Right;Left      Palpation   Palpation comment TTP grossly around the knee and peri-patellar      Ambulation/Gait   Assistive device Rollator    Gait Pattern Step-to pattern;Decreased stride length;Antalgic;Trendelenburg;Trunk flexed;Narrow base of support                      Objective measurements completed on examination: See above findings.       [facility] Adult PT Treatment/Exercise - 10/17/20 1205      Exercises   Exercises Knee/Hip      Knee/Hip Exercises: Seated   Heel Slides Strengthening;AROM;10 reps;Left      Knee/Hip Exercises: Supine   Quad Sets 1 set;10 reps   holding 5 seconds with towel under knee   Heel Slides Left;AAROM;1 set;10 reps   holding 5 seconds                 PT Education - 10/17/20 1221    Education Details evaluation findings, POC, goals, HEP with proper form/ rationale. FOTO assessment began PNE.    Person(s) Educated Patient    Methods Explanation;Verbal cues;Handout    Comprehension Verbalized understanding;Verbal cues required            PT Short Term Goals - 10/17/20 1249      PT SHORT TERM GOAL #1   Title pt to be IND with initial HEP    Time 4    Period Weeks    Status New    Target Date 11/14/20      PT SHORT TERM GOAL #2   Title increase L knee AAROM flexion to >/= 80 degrees for therapeutic progression    Time 4    Period Weeks    Status New    Target Date 11/14/20      PT SHORT TERM GOAL #3   Title pt to ambulate utizing step through pattern with LRAD with </= 7/10 max for safety and improvement with gait  efficiency    Time 4    Period Weeks    Status New    Target Date 11/14/20      PT SHORT TERM GOAL #4   Title incrase FOTO score by >/= 6 points to promote improvement in function    Time 4    Period Weeks    Status New    Target Date 11/28/20              PT Long Term Goals - 10/17/20 1253      PT LONG TERM GOAL #1   Title increase knee total arc ROM to >/= 5 - 108 with </= 3/10 pain max for function ROM required for ADLs    Time 8    Period Weeks    Status New    Target Date 12/12/20      PT LONG TERM GOAL #2   Title pt to be able to sit, stand and walk for >/= 45 min with LRAD for inhome and short community distances    Time 8    Period Weeks    Status New    Target Date 12/12/20      PT LONG TERM GOAL #3   Title increase LLE gross strength to >/= 4/5 to promote knee/ hip stability to maximize safety    Time 8    Period Weeks    Status New    Target Date 12/12/20      PT LONG TERM GOAL #4   Title increase FOTO score to >/= 64% to demo improvement in function    Time 8    Period Weeks    Status New    Target Date 12/12/20      PT LONG TERM GOAL #5   Title pt to be IND with all HEP given and is able to maintain and progress current LOF    Time 8    Period Weeks    Status New    Target Date 12/12/20                  Plan - 10/17/20 1238    Clinical Impression Statement pt is a 30 y.o F presenting to [facility] s/p L ACL reconstruction on 10/12/2020. she dmeonstrates gross limitation with L knee AROM/ PROM and swelling which is expected following surgery . MMT wasn't performed due precautions for LLE. TTP around the knee with no specific area of tenderness. She currently ambulates with Rollator with limited stride in a flexed position due to improper size of rollator, and exhibits step-to pattern. she exhibits apprehension to activity secondary to pain and stiffness. She would benefit from physical therapy to reduce pain, improve knee ROM and strength, maximize gait efficiency and overall function by addressing the deficits listed.    Personal Factors and Comorbidities Comorbidity 2    Comorbidities hx of depression, mental disability    Examination-Activity Limitations Stand;Locomotion Level;Stairs    Stability/Clinical  Decision Making Evolving/Moderate complexity    Clinical Decision Making Moderate    Rehab Potential Good    PT Frequency 2x / week    PT Duration 12 weeks    PT Treatment/Interventions ADLs/Self Care Home Management;Cryotherapy;Electrical Stimulation;Iontophoresis 4mg /ml Dexamethasone;Moist Heat;Ultrasound;Gait training;Stair training;Functional mobility training;Therapeutic activities;Therapeutic exercise;Balance training;Neuromuscular re-education;Patient/family education;Manual techniques;Passive range of motion;Taping;Vasopneumatic Device    PT Next Visit Plan review/ update HEP PRN, post op 1 week on 3/3. quad activation, knee ROM, gait training, vaso for pain and swelling. How was MD visit on Thursday  PT Home Exercise Plan [id number] - quad set, heel slide with strap, seated heel slide, seated heel/ toe raise.    Consulted and Agree with Plan of Care Patient;Family member/caregiver    Family Member Consulted mom           Patient will benefit from skilled therapeutic intervention in order to improve the following deficits and impairments:  Improper body mechanics,Increased muscle spasms,Pain,Decreased activity tolerance,Postural dysfunction,Difficulty walking,Abnormal gait,Decreased strength,Decreased endurance,Increased edema  Visit Diagnosis: Chronic pain of left knee  Localized edema  Other abnormalities of gait and mobility  Stiffness of left knee, not elsewhere classified     Problem List Patient Active Problem List   Diagnosis Date Noted  . Pain in left elbow 07/27/2020  . Prediabetes 02/03/2019  . Obstructive sleep apnea 04/23/2018  . Other fatigue 11/26/2017  . Shortness of breath on exertion 11/26/2017  . Type 2 diabetes mellitus without complication, without long-term current use of insulin ([place]) 11/26/2017  . Other hyperlipidemia 11/26/2017  . Vitamin D deficiency 11/26/2017  . Schizoaffective disorder, bipolar type ([place]) 09/14/2014  . Intellectual  disability 09/10/2014    [name] PT, DPT, LAT, ATC  10/17/20  12:58 PM      [facility] [address] [place], [place], [address] Phone: [phone]   Fax:  [phone]  Name: Cindy Phillips MRN: [id number] Date of Birth: [birth year]

## 2020-10-19 ENCOUNTER — Other Ambulatory Visit: Payer: Self-pay

## 2020-10-19 ENCOUNTER — Ambulatory Visit: Payer: Medicare Other | Attending: Orthopedic Surgery | Admitting: Rehabilitative and Restorative Service Providers"

## 2020-10-19 ENCOUNTER — Encounter: Payer: Self-pay | Admitting: Rehabilitative and Restorative Service Providers"

## 2020-10-19 DIAGNOSIS — R600 Localized edema: Secondary | ICD-10-CM | POA: Diagnosis not present

## 2020-10-19 DIAGNOSIS — R2689 Other abnormalities of gait and mobility: Secondary | ICD-10-CM | POA: Diagnosis not present

## 2020-10-19 DIAGNOSIS — M25562 Pain in left knee: Secondary | ICD-10-CM | POA: Diagnosis not present

## 2020-10-19 DIAGNOSIS — G8929 Other chronic pain: Secondary | ICD-10-CM | POA: Insufficient documentation

## 2020-10-19 DIAGNOSIS — M25662 Stiffness of left knee, not elsewhere classified: Secondary | ICD-10-CM | POA: Diagnosis not present

## 2020-10-19 NOTE — Therapy (Signed)
[place], [place], [address] Phone: [phone]   Fax:  [phone]  Physical Therapy Treatment  Patient Details  Name: Cindy Phillips MRN: [id number] Date of Birth: Apr 03, 1991 Referring Provider (PT): Earlie Server, MD   Encounter Date: 10/19/2020   PT End of Session - 10/19/20 1611    Visit Number 2    PT Start Time 0307    PT Stop Time 0350    PT Time Calculation (min) 43 min    Activity Tolerance Patient tolerated treatment well;No increased pain    Behavior During Therapy WFL for tasks assessed/performed           Past Medical History:  Diagnosis Date  . Bipolar 1 disorder ([place])   . Bowel incontinence   . Depression   . Diabetes mellitus without complication ([place])   . Mental disability     Past Surgical History:  Procedure Laterality Date  . FOOT SURGERY    . KNEE ARTHROSCOPY W/ ACL RECONSTRUCTION Left 10/12/2020    There were no vitals filed for this visit.   Subjective Assessment - 10/19/20 1525    Subjective I am working hard...(walking from waiting room to therapy gym approx 34feet).    Currently in Pain? Yes    Pain Score 9     Pain Location Knee    Pain Orientation Left    Pain Descriptors / Indicators Aching;Tingling;Pins and needles    Pain Type Chronic pain    Pain Onset More than a month ago    Pain Frequency Intermittent    Multiple Pain Sites No                             [facility] Adult PT Treatment/Exercise - 10/19/20 0001      Ambulation/Gait   Gait Comments GT x 40 ft x 2 performed with emphasis on properly standing within crutches, not leaning on crutches, and proper step. Repetition was needed multiple times with carryover approx 20%.      Knee/Hip Exercises: Standing   Other Standing Knee Exercises forward/backward and lateral weightshifts x 20 each direction with PT CGA for safety      Knee/Hip Exercises: Seated   Other Seated  Knee/Hip Exercises STS from chair with arm rests x 10 with concentration on eccentric control; with L heel propped on step stool, quad sets 2x10 with PT verbal cues for keeping foot in neutral; assisted L heel slides using R LE x 20                  PT Education - 10/19/20 1607    Education Details When pt arrived and walked back to the therapy room, pt was leaning on her crutches with her crutches almost touching anteriorly and they were too high under her axilla. She also had on slides with her toes almost touching the floor. Her immobilizer was also too low and all the way down to her ankle. PT spoke with her about proper immobilizer placement, wearing appropriate shoes or at least a regular shoe on her right foot that was about the same height as the slide on her L foot, and importance of not leaning on her crutches and the risks of what may happen if she leans on them. PT also readjusted the crutches; patient stated her Mom had adjusted them for her. PT placed them at more of the proper height. Pt agreed to all  education.    Person(s) Educated Patient    Methods Explanation    Comprehension Verbalized understanding;Returned demonstration            PT Short Term Goals - 10/19/20 1614      PT SHORT TERM GOAL #1   Title pt to be IND with initial HEP    Status On-going      PT SHORT TERM GOAL #2   Title increase L knee AAROM flexion to >/= 80 degrees for therapeutic progression    Status On-going      PT SHORT TERM GOAL #3   Title pt to ambulate utizing step through pattern with LRAD with </= 7/10 max for safety and improvement with gait efficiency    Status On-going      PT SHORT TERM GOAL #4   Title incrase FOTO score by >/= 6 points to promote improvement in function    Status On-going             PT Long Term Goals - 10/17/20 1253      PT LONG TERM GOAL #1   Title increase knee total arc ROM to >/= 5 - 108 with </= 3/10 pain max for function ROM required for ADLs     Time 8    Period Weeks    Status New    Target Date 12/12/20      PT LONG TERM GOAL #2   Title pt to be able to sit, stand and walk for >/= 45 min with LRAD for inhome and short community distances    Time 8    Period Weeks    Status New    Target Date 12/12/20      PT LONG TERM GOAL #3   Title increase LLE gross strength to >/= 4/5 to promote knee/ hip stability to maximize safety    Time 8    Period Weeks    Status New    Target Date 12/12/20      PT LONG TERM GOAL #4   Title increase FOTO score to >/= 64% to demo improvement in function    Time 8    Period Weeks    Status New    Target Date 12/12/20      PT LONG TERM GOAL #5   Title pt to be IND with all HEP given and is able to maintain and progress current LOF    Time 8    Period Weeks    Status New    Target Date 12/12/20                 Plan - 10/19/20 1612    Clinical Impression Statement Pt presents to PT s/p L ACL reconstruction 10/12/2020. She returns to MD 10/20/2020. She demonstrates L knee limited AROM/PROM with swelling. She currently still has on her surgical bandage. She arrived to PT with crutches which were used improperly. Gait training and therex were performed this visit. Pt did not complain of pain and had multiple distractions in the gym that she could focus on. Pt would benefit from PT for knee ROM, strengthening, gt training and overall functional retraining per protocol to address deficits.    PT Treatment/Interventions ADLs/Self Care Home Management;Cryotherapy;Electrical Stimulation;Iontophoresis 4mg /ml Dexamethasone;Moist Heat;Ultrasound;Gait training;Stair training;Functional mobility training;Therapeutic activities;Therapeutic exercise;Balance training;Neuromuscular re-education;Patient/family education;Manual techniques;Passive range of motion;Taping;Vasopneumatic Device    PT Next Visit Plan review/ update HEP PRN, post op 1 week on 3/3. quad activation, knee ROM, gait training, vaso for  pain and swelling. How was MD visit on Thursday    Consulted and Agree with Plan of Care Patient           Patient will benefit from skilled therapeutic intervention in order to improve the following deficits and impairments:  Improper body mechanics,Increased muscle spasms,Pain,Decreased activity tolerance,Postural dysfunction,Difficulty walking,Abnormal gait,Decreased strength,Decreased endurance,Increased edema  Visit Diagnosis: Localized edema  Chronic pain of left knee  Other abnormalities of gait and mobility  Stiffness of left knee, not elsewhere classified     Problem List Patient Active Problem List   Diagnosis Date Noted  . Pain in left elbow 07/27/2020  . Prediabetes 02/03/2019  . Obstructive sleep apnea 04/23/2018  . Other fatigue 11/26/2017  . Shortness of breath on exertion 11/26/2017  . Type 2 diabetes mellitus without complication, without long-term current use of insulin ([place]) 11/26/2017  . Other hyperlipidemia 11/26/2017  . Vitamin D deficiency 11/26/2017  . Schizoaffective disorder, bipolar type ([place]) 09/14/2014  . Intellectual disability 09/10/2014    Myra Rude, PT 10/19/2020, 4:19 PM  [facility] [address] [place], [place], [address] Phone: [phone]   Fax:  [phone]  Name: MALLOREE RABOIN MRN: [id number] Date of Birth: 1991-03-30

## 2020-10-20 DIAGNOSIS — S83282D Other tear of lateral meniscus, current injury, left knee, subsequent encounter: Secondary | ICD-10-CM | POA: Diagnosis not present

## 2020-10-31 DIAGNOSIS — E1169 Type 2 diabetes mellitus with other specified complication: Secondary | ICD-10-CM | POA: Diagnosis not present

## 2020-10-31 DIAGNOSIS — K5900 Constipation, unspecified: Secondary | ICD-10-CM | POA: Diagnosis not present

## 2020-10-31 DIAGNOSIS — R002 Palpitations: Secondary | ICD-10-CM | POA: Diagnosis not present

## 2020-11-01 ENCOUNTER — Other Ambulatory Visit: Payer: Self-pay

## 2020-11-01 ENCOUNTER — Encounter: Payer: Self-pay | Admitting: Physical Therapy

## 2020-11-01 ENCOUNTER — Ambulatory Visit: Payer: Medicare Other | Admitting: Physical Therapy

## 2020-11-01 DIAGNOSIS — G8929 Other chronic pain: Secondary | ICD-10-CM | POA: Diagnosis not present

## 2020-11-01 DIAGNOSIS — R2689 Other abnormalities of gait and mobility: Secondary | ICD-10-CM

## 2020-11-01 DIAGNOSIS — M25562 Pain in left knee: Secondary | ICD-10-CM

## 2020-11-01 DIAGNOSIS — R600 Localized edema: Secondary | ICD-10-CM

## 2020-11-01 DIAGNOSIS — M25662 Stiffness of left knee, not elsewhere classified: Secondary | ICD-10-CM | POA: Diagnosis not present

## 2020-11-01 NOTE — Patient Instructions (Signed)
   Voncille Lo, PT, [place] Certified Exercise Expert for the Aging Adult  11/01/20 3:36 PM Phone: [phone] Fax: [phone]

## 2020-11-01 NOTE — Therapy (Signed)
[place], [place], [address] Phone: [phone]   Fax:  [phone]  Physical Therapy Treatment  Patient Details  Name: Cindy Phillips MRN: [id number] Date of Birth: 1991-04-12 Referring Provider (PT): Earlie Server, MD   Encounter Date: 11/01/2020   PT End of Session - 11/01/20 1708    Visit Number 3    Number of Visits 17    Date for PT Re-Evaluation 12/12/20    Authorization Type MCR: Kx mod 15th visit,  FOTO 6th and 10th visit    PT Start Time 1502    PT Stop Time 1545    PT Time Calculation (min) 43 min    Activity Tolerance Patient tolerated treatment well;No increased pain    Behavior During Therapy WFL for tasks assessed/performed           Past Medical History:  Diagnosis Date  . Bipolar 1 disorder ([place])   . Bowel incontinence   . Depression   . Diabetes mellitus without complication ([place])   . Mental disability     Past Surgical History:  Procedure Laterality Date  . FOOT SURGERY    . KNEE ARTHROSCOPY W/ ACL RECONSTRUCTION Left 10/12/2020    There were no vitals filed for this visit.   Subjective Assessment - 11/01/20 1506    Subjective I am doing my exercises at home. I forgot my brace a home.    Patient Stated Goals get the knee to heal well, decrease pain, increase the strength    Currently in Pain? Yes    Pain Score 7     Pain Location Knee    Pain Orientation Left    Pain Descriptors / Indicators Aching              [facility] PT Assessment - 11/01/20 0001      AROM   Left Knee Extension 15    Left Knee Flexion 95                         [facility] Adult PT Treatment/Exercise - 11/01/20 0001      Ambulation/Gait   Gait Comments gait 50 feet x 2 with step to gait and not utilizing tripod for stability in gait,  Pt requires mod cuing multiple times in order to peform correct sequencing of gait.      Self-Care   Self-Care Other Self-Care Comments     Other Self-Care Comments  scar tissue massage and care of knee surgical areas      Exercises   Exercises Knee/Hip      Knee/Hip Exercises: Standing   Other Standing Knee Exercises forward/backward and lateral weightshifts x 20 each direction with PT CGA for safety    Other Standing Knee Exercises mini squat holding onto back of chair 10 x      Knee/Hip Exercises: Seated   Heel Slides Strengthening;Left;2 sets;10 reps    Heel Slides Limitations with green strap      Knee/Hip Exercises: Supine   Quad Sets 3 sets;10 reps    Quad Sets Limitations attempted standing with TKE but unable to perform with good technique    Heel Slides AAROM;Strengthening;Left;2 sets;10 reps    Heel Slides Limitations using green strap and constant cuing                    PT Short Term Goals - 10/19/20 1614      PT SHORT TERM GOAL #1  Title pt to be IND with initial HEP    Status On-going      PT SHORT TERM GOAL #2   Title increase L knee AAROM flexion to >/= 80 degrees for therapeutic progression    Status On-going      PT SHORT TERM GOAL #3   Title pt to ambulate utizing step through pattern with LRAD with </= 7/10 max for safety and improvement with gait efficiency    Status On-going      PT SHORT TERM GOAL #4   Title incrase FOTO score by >/= 6 points to promote improvement in function    Status On-going             PT Long Term Goals - 10/17/20 1253      PT LONG TERM GOAL #1   Title increase knee total arc ROM to >/= 5 - 108 with </= 3/10 pain max for function ROM required for ADLs    Time 8    Period Weeks    Status New    Target Date 12/12/20      PT LONG TERM GOAL #2   Title pt to be able to sit, stand and walk for >/= 45 min with LRAD for inhome and short community distances    Time 8    Period Weeks    Status New    Target Date 12/12/20      PT LONG TERM GOAL #3   Title increase LLE gross strength to >/= 4/5 to promote knee/ hip stability to maximize safety     Time 8    Period Weeks    Status New    Target Date 12/12/20      PT LONG TERM GOAL #4   Title increase FOTO score to >/= 64% to demo improvement in function    Time 8    Period Weeks    Status New    Target Date 12/12/20      PT LONG TERM GOAL #5   Title pt to be IND with all HEP given and is able to maintain and progress current LOF    Time 8    Period Weeks    Status New    Target Date 12/12/20                 Plan - 11/01/20 1709    Clinical Impression Statement Pt presents to PT s/p L ACL reconstruction 10/12/20.  Pt 3 week post and was given brace to wear by MD but did not bring to clinic.  Pt was told to bring brace and to wear whenever she is walking.   Pt currently with surgical bandage not wrapped well.  Pt had to be reinforced on proper way to utilized crutches and proper gait technique.  Pt stated she was 7/10 pain in knee but did not complain during session. Pt is easily distracted. Pt Left knee measure  ext -15 , flex 95.  Pt encouraged to perform exercises at home as given. Will continu POC and protocol.    Personal Factors and Comorbidities Comorbidity 2    Comorbidities hx of depression, mental disability    Examination-Activity Limitations Stand;Locomotion Level;Stairs    Stability/Clinical Decision Making Evolving/Moderate complexity    Clinical Decision Making Moderate    Rehab Potential Good    PT Frequency 2x / week    PT Duration 12 weeks    PT Treatment/Interventions ADLs/Self Care Home Management;Cryotherapy;Electrical Stimulation;Iontophoresis 4mg /ml Dexamethasone;Moist Heat;Ultrasound;Gait training;Stair training;Functional  mobility training;Therapeutic activities;Therapeutic exercise;Balance training;Neuromuscular re-education;Patient/family education;Manual techniques;Passive range of motion;Taping;Vasopneumatic Device    PT Next Visit Plan review/ update HEP PRN, . quad activation, knee ROM, gait training, vaso for pain and swelling. MD gave pt  brace she did not bring to clinic  Should bring next visit    PT [place] - quad set, heel slide with strap, seated heel slide, seated heel/ toe raise. mini squat, long sitting quad set with towel roll    Consulted and Agree with Plan of Care Patient           Patient will benefit from skilled therapeutic intervention in order to improve the following deficits and impairments:  Improper body mechanics,Increased muscle spasms,Pain,Decreased activity tolerance,Postural dysfunction,Difficulty walking,Abnormal gait,Decreased strength,Decreased endurance,Increased edema  Visit Diagnosis: Localized edema  Chronic pain of left knee  Other abnormalities of gait and mobility  Stiffness of left knee, not elsewhere classified Access Code: [email or web address]: [email or web address]: 03/15/2022Prepared by: Donnetta Simpers BeardsleyExercises   Long Sitting Quad Set with Towel Roll Under Heel - 1 x daily - 7 x weekly - 3 sets - 10 reps  Mini Squat with Counter Support - 1 x daily - 7 x weekly - 3 sets - 10 reps     Problem List Patient Active Problem List   Diagnosis Date Noted  . Pain in left elbow 07/27/2020  . Prediabetes 02/03/2019  . Obstructive sleep apnea 04/23/2018  . Other fatigue 11/26/2017  . Shortness of breath on exertion 11/26/2017  . Type 2 diabetes mellitus without complication, without long-term current use of insulin ([place]) 11/26/2017  . Other hyperlipidemia 11/26/2017  . Vitamin D deficiency 11/26/2017  . Schizoaffective disorder, bipolar type ([place]) 09/14/2014  . Intellectual disability 09/10/2014    Voncille Lo, PT, [place] Certified Exercise Expert for the Aging Adult  11/01/20 5:17 PM Phone: [phone] Fax: [phone]  [facility] [address] [place], [place], [address] Phone: [phone]   Fax:  [phone]  Name: KHRISTIE SAK MRN: [id number] Date of Birth: September 26, 1990

## 2020-11-04 ENCOUNTER — Ambulatory Visit: Payer: Medicare Other | Admitting: Physical Therapy

## 2020-11-04 ENCOUNTER — Encounter: Payer: Self-pay | Admitting: Physical Therapy

## 2020-11-04 ENCOUNTER — Other Ambulatory Visit: Payer: Self-pay

## 2020-11-04 DIAGNOSIS — R2689 Other abnormalities of gait and mobility: Secondary | ICD-10-CM

## 2020-11-04 DIAGNOSIS — M25662 Stiffness of left knee, not elsewhere classified: Secondary | ICD-10-CM

## 2020-11-04 DIAGNOSIS — G8929 Other chronic pain: Secondary | ICD-10-CM

## 2020-11-04 DIAGNOSIS — R600 Localized edema: Secondary | ICD-10-CM | POA: Diagnosis not present

## 2020-11-04 DIAGNOSIS — M25562 Pain in left knee: Secondary | ICD-10-CM | POA: Diagnosis not present

## 2020-11-04 NOTE — Therapy (Signed)
[place], [place], [address] Phone: [phone]   Fax:  [phone]  Physical Therapy Treatment  Patient Details  Name: Cindy Phillips MRN: [id number] Date of Birth: 08-Feb-1991 Referring Provider (PT): Earlie Server, MD   Encounter Date: 11/04/2020   PT End of Session - 11/04/20 1313    Visit Number 4    Number of Visits 17    Date for PT Re-Evaluation 12/12/20    Authorization Type MCR: Kx mod 15th visit,  FOTO 6th and 10th visit    Progress Note Due on Visit 10    PT Start Time 1310    PT Stop Time 1350    PT Time Calculation (min) 40 min    Activity Tolerance Patient tolerated treatment well;No increased pain    Behavior During Therapy WFL for tasks assessed/performed           Past Medical History:  Diagnosis Date  . Bipolar 1 disorder ([place])   . Bowel incontinence   . Depression   . Diabetes mellitus without complication ([place])   . Mental disability     Past Surgical History:  Procedure Laterality Date  . FOOT SURGERY    . KNEE ARTHROSCOPY W/ ACL RECONSTRUCTION Left 10/12/2020    There were no vitals filed for this visit.   Subjective Assessment - 11/04/20 1317    Subjective "I am feeling alittle better, Still practicing the walking with the crutches.    Currently in Pain? Yes    Pain Score 7     Pain Location Knee    Pain Orientation Left    Pain Descriptors / Indicators Aching    Pain Type Surgical pain              [facility] PT Assessment - 11/04/20 0001      Assessment   Medical Diagnosis L ACL reconstruction      AROM   Left Knee Extension 13    Left Knee Flexion 90   following bike and manual 114 degrees                        [facility] Adult PT Treatment/Exercise - 11/04/20 0001      Knee/Hip Exercises: Stretches   Active Hamstring Stretch 2 reps;30 seconds      Knee/Hip Exercises: Aerobic   Recumbent Bike full revolutions x 6 min   mutlimodal  cues for foot positioning to promote knee flexion     Knee/Hip Exercises: Standing   Terminal Knee Extension Strengthening;Left;2 sets;10 reps   2 x 10 with green band back of the knee   Gait Training using bil crutches 2 x 200 ft, step through pattern taking smaller steps focusing on smooth motion and avoiding start/ stopping.      Knee/Hip Exercises: Seated   Sit to Sand 1 set;10 reps      Knee/Hip Exercises: Supine   Quad Sets 2 sets;10 reps   with manual overpressure   Bridges 2 sets;10 reps;Both;Strengthening      Manual Therapy   Manual therapy comments tibiofemoral AP/PA grade IV                    PT Short Term Goals - 10/19/20 1614      PT SHORT TERM GOAL #1   Title pt to be IND with initial HEP    Status On-going      PT SHORT TERM GOAL #2  Title increase L knee AAROM flexion to >/= 80 degrees for therapeutic progression    Status On-going      PT SHORT TERM GOAL #3   Title pt to ambulate utizing step through pattern with LRAD with </= 7/10 max for safety and improvement with gait efficiency    Status On-going      PT SHORT TERM GOAL #4   Title incrase FOTO score by >/= 6 points to promote improvement in function    Status On-going             PT Long Term Goals - 10/17/20 1253      PT LONG TERM GOAL #1   Title increase knee total arc ROM to >/= 5 - 108 with </= 3/10 pain max for function ROM required for ADLs    Time 8    Period Weeks    Status New    Target Date 12/12/20      PT LONG TERM GOAL #2   Title pt to be able to sit, stand and walk for >/= 45 min with LRAD for inhome and short community distances    Time 8    Period Weeks    Status New    Target Date 12/12/20      PT LONG TERM GOAL #3   Title increase LLE gross strength to >/= 4/5 to promote knee/ hip stability to maximize safety    Time 8    Period Weeks    Status New    Target Date 12/12/20      PT LONG TERM GOAL #4   Title increase FOTO score to >/= 64% to demo  improvement in function    Time 8    Period Weeks    Status New    Target Date 12/12/20      PT LONG TERM GOAL #5   Title pt to be IND with all HEP given and is able to maintain and progress current LOF    Time 8    Period Weeks    Status New    Target Date 12/12/20                 Plan - 11/04/20 1326    Clinical Impression Statement pt reports 7/10 today with no visual indicator that would signifiy that level of pain. Started  on the bike which she was able to get full revolutions with verbal/ tactile cues for foot position to promote motion. she improved her knee flexoin to 114 degrees following session. continued working on hip knee strengthening focusing on [facility] activities. worked on Personnel officer focusing on small steps avoiding step to pattern, which she required heavy cues for.    PT Treatment/Interventions ADLs/Self Care Home Management;Cryotherapy;Electrical Stimulation;Iontophoresis 4mg /ml Dexamethasone;Moist Heat;Ultrasound;Gait training;Stair training;Functional mobility training;Therapeutic activities;Therapeutic exercise;Balance training;Neuromuscular re-education;Patient/family education;Manual techniques;Passive range of motion;Taping;Vasopneumatic Device    PT Next Visit Plan review/ update HEP PRN, . quad activation, knee ROM, gait training, vaso for pain and swelling. stair training, trial leg press    PT [place] - quad set, heel slide with strap, seated heel slide, seated heel/ toe raise. mini squat, long sitting quad set with towel roll    Consulted and Agree with Plan of Care Patient           Patient will benefit from skilled therapeutic intervention in order to improve the following deficits and impairments:  Improper body mechanics,Increased muscle spasms,Pain,Decreased activity tolerance,Postural dysfunction,Difficulty walking,Abnormal gait,Decreased strength,Decreased endurance,Increased edema  Visit Diagnosis: Localized  edema  Chronic pain of left knee  Other abnormalities of gait and mobility  Stiffness of left knee, not elsewhere classified     Problem List Patient Active Problem List   Diagnosis Date Noted  . Pain in left elbow 07/27/2020  . Prediabetes 02/03/2019  . Obstructive sleep apnea 04/23/2018  . Other fatigue 11/26/2017  . Shortness of breath on exertion 11/26/2017  . Type 2 diabetes mellitus without complication, without long-term current use of insulin ([place]) 11/26/2017  . Other hyperlipidemia 11/26/2017  . Vitamin D deficiency 11/26/2017  . Schizoaffective disorder, bipolar type ([place]) 09/14/2014  . Intellectual disability 09/10/2014    [name] PT, DPT, LAT, ATC  11/04/20  1:55 PM      [facility] [address] [place], [place], [address] Phone: [phone]   Fax:  [phone]  Name: Cindy Phillips MRN: [id number] Date of Birth: [birth year]

## 2020-11-07 ENCOUNTER — Ambulatory Visit: Payer: Medicare Other | Admitting: Physical Therapy

## 2020-11-07 ENCOUNTER — Other Ambulatory Visit: Payer: Self-pay

## 2020-11-07 ENCOUNTER — Encounter: Payer: Self-pay | Admitting: Physical Therapy

## 2020-11-07 DIAGNOSIS — M25662 Stiffness of left knee, not elsewhere classified: Secondary | ICD-10-CM | POA: Diagnosis not present

## 2020-11-07 DIAGNOSIS — R600 Localized edema: Secondary | ICD-10-CM | POA: Diagnosis not present

## 2020-11-07 DIAGNOSIS — R2689 Other abnormalities of gait and mobility: Secondary | ICD-10-CM

## 2020-11-07 DIAGNOSIS — M25562 Pain in left knee: Secondary | ICD-10-CM | POA: Diagnosis not present

## 2020-11-07 DIAGNOSIS — G8929 Other chronic pain: Secondary | ICD-10-CM

## 2020-11-07 NOTE — Therapy (Signed)
[place], [place], [address] Phone: [phone]   Fax:  [phone]  Physical Therapy Treatment  Patient Details  Name: Cindy Phillips MRN: [id number] Date of Birth: 17-Mar-1991 Referring Provider (PT): Earlie Server, MD   Encounter Date: 11/07/2020   PT End of Session - 11/07/20 1546    Visit Number 5    Number of Visits 17    Date for PT Re-Evaluation 12/12/20    Authorization Type MCR: Kx mod 15th visit,  FOTO 6th and 10th visit    Progress Note Due on Visit 10    PT Start Time 1505    PT Stop Time 1544    PT Time Calculation (min) 39 min    Activity Tolerance Patient tolerated treatment well;No increased pain    Behavior During Therapy WFL for tasks assessed/performed           Past Medical History:  Diagnosis Date  . Bipolar 1 disorder ([place])   . Bowel incontinence   . Depression   . Diabetes mellitus without complication ([place])   . Mental disability     Past Surgical History:  Procedure Laterality Date  . FOOT SURGERY    . KNEE ARTHROSCOPY W/ ACL RECONSTRUCTION Left 10/12/2020    There were no vitals filed for this visit.   Subjective Assessment - 11/07/20 1506    Subjective "I am doing better today, I've been trying to walk without the crutches."    Patient Stated Goals get the knee to heal well, decrease pain, increase the strength    Currently in Pain? Yes    Pain Score 0-No pain    Pain Orientation Left    Pain Type Surgical pain    Pain Onset 1 to 4 weeks ago    Pain Frequency Intermittent    Aggravating Factors  unsure    Pain Relieving Factors medication              [facility] PT Assessment - 11/07/20 0001      Assessment   Medical Diagnosis L ACL reconstruction      AROM   Left Knee Extension 29    Left Knee Flexion 97                         [facility] Adult PT Treatment/Exercise - 11/07/20 0001      Knee/Hip Exercises: Aerobic   Recumbent Bike  full revolutions x 5 min      Knee/Hip Exercises: Standing   Terminal Knee Extension Left;10 reps   with ball; 5 sec hold   Forward Step Up 2 sets;10 reps   Left leading; in // bars - 4in step   Step Down 2 sets;10 reps   Left stance; UE assist on // bars   Functional Squat 15 reps   in // bars; tactile cues for increasing squat depth                 PT Education - 11/07/20 1545    Education Details Review of HEP with added exercise for increasing quad activation for standing terminal knee extension against wall.    Person(s) Educated Patient    Methods Explanation;Handout    Comprehension Verbalized understanding;Returned demonstration;Tactile cues required            PT Short Term Goals - 10/19/20 1614      PT SHORT TERM GOAL #1   Title pt to be IND with initial  HEP    Status On-going      PT SHORT TERM GOAL #2   Title increase L knee AAROM flexion to >/= 80 degrees for therapeutic progression    Status On-going      PT SHORT TERM GOAL #3   Title pt to ambulate utizing step through pattern with LRAD with </= 7/10 max for safety and improvement with gait efficiency    Status On-going      PT SHORT TERM GOAL #4   Title incrase FOTO score by >/= 6 points to promote improvement in function    Status On-going             PT Long Term Goals - 10/17/20 1253      PT LONG TERM GOAL #1   Title increase knee total arc ROM to >/= 5 - 108 with </= 3/10 pain max for function ROM required for ADLs    Time 8    Period Weeks    Status New    Target Date 12/12/20      PT LONG TERM GOAL #2   Title pt to be able to sit, stand and walk for >/= 45 min with LRAD for inhome and short community distances    Time 8    Period Weeks    Status New    Target Date 12/12/20      PT LONG TERM GOAL #3   Title increase LLE gross strength to >/= 4/5 to promote knee/ hip stability to maximize safety    Time 8    Period Weeks    Status New    Target Date 12/12/20      PT LONG  TERM GOAL #4   Title increase FOTO score to >/= 64% to demo improvement in function    Time 8    Period Weeks    Status New    Target Date 12/12/20      PT LONG TERM GOAL #5   Title pt to be IND with all HEP given and is able to maintain and progress current LOF    Time 8    Period Weeks    Status New    Target Date 12/12/20                 Plan - 11/07/20 1547    Clinical Impression Statement pt presented to PT today with continued reports of L knee pain, although she denied pain with further clarification. She was once again able to perform full revolution on recumbent bike, improving L knee flexion. However, she continues to have decreased L knee extension secondary to decreased quad strength. She tolerated therapeutic exercises well, but did note significant quad fatigue at end of session and required numerous tactile cues for improved sequencing and movement of standing exercises. Will continue to work on progressing LE strengthening as tolerated per POC as prescribed.    PT Treatment/Interventions ADLs/Self Care Home Management;Cryotherapy;Electrical Stimulation;Iontophoresis 4mg /ml Dexamethasone;Moist Heat;Ultrasound;Gait training;Stair training;Functional mobility training;Therapeutic activities;Therapeutic exercise;Balance training;Neuromuscular re-education;Patient/family education;Manual techniques;Passive range of motion;Taping;Vasopneumatic Device    PT Next Visit Plan review/ update HEP PRN, . quad activation, knee ROM, gait training, vaso for pain and swelling. stair training, trial leg press           Patient will benefit from skilled therapeutic intervention in order to improve the following deficits and impairments:  Improper body mechanics,Increased muscle spasms,Pain,Decreased activity tolerance,Postural dysfunction,Difficulty walking,Abnormal gait,Decreased strength,Decreased endurance,Increased edema  Visit Diagnosis: Localized edema  Chronic pain of  left  knee  Other abnormalities of gait and mobility  Stiffness of left knee, not elsewhere classified     Problem List Patient Active Problem List   Diagnosis Date Noted  . Pain in left elbow 07/27/2020  . Prediabetes 02/03/2019  . Obstructive sleep apnea 04/23/2018  . Other fatigue 11/26/2017  . Shortness of breath on exertion 11/26/2017  . Type 2 diabetes mellitus without complication, without long-term current use of insulin ([place]) 11/26/2017  . Other hyperlipidemia 11/26/2017  . Vitamin D deficiency 11/26/2017  . Schizoaffective disorder, bipolar type ([place]) 09/14/2014  . Intellectual disability 09/10/2014   [name] PT, DPT, LAT, ATC  11/07/20  3:53 PM   [facility] [address] [place], [place], [address] Phone: [phone]   Fax:  [phone]  Name: MERILYNN HAYDU MRN: [id number] Date of Birth: 13-Aug-1991

## 2020-11-09 ENCOUNTER — Encounter: Payer: Self-pay | Admitting: Physical Therapy

## 2020-11-09 ENCOUNTER — Other Ambulatory Visit: Payer: Self-pay

## 2020-11-09 ENCOUNTER — Ambulatory Visit: Payer: Medicare Other | Admitting: Physical Therapy

## 2020-11-09 DIAGNOSIS — R600 Localized edema: Secondary | ICD-10-CM

## 2020-11-09 DIAGNOSIS — M25562 Pain in left knee: Secondary | ICD-10-CM | POA: Diagnosis not present

## 2020-11-09 DIAGNOSIS — G8929 Other chronic pain: Secondary | ICD-10-CM

## 2020-11-09 DIAGNOSIS — M25662 Stiffness of left knee, not elsewhere classified: Secondary | ICD-10-CM

## 2020-11-09 DIAGNOSIS — R2689 Other abnormalities of gait and mobility: Secondary | ICD-10-CM

## 2020-11-09 NOTE — Therapy (Signed)
[place], [place], [address] Phone: [phone]   Fax:  [phone]  Physical Therapy Treatment  Patient Details  Name: Cindy Phillips MRN: [id number] Date of Birth: 05-16-1991 Referring Provider (PT): Earlie Server, MD   Encounter Date: 11/09/2020   PT End of Session - 11/09/20 1455    Visit Number 6    Number of Visits 17    Date for PT Re-Evaluation 12/12/20    Authorization Type MCR: Kx mod 15th visit,  FOTO 6th and 10th visit    Progress Note Due on Visit 10    PT Start Time 1504    PT Stop Time 1542    PT Time Calculation (min) 38 min    Activity Tolerance Patient tolerated treatment well;No increased pain    Behavior During Therapy WFL for tasks assessed/performed           Past Medical History:  Diagnosis Date  . Bipolar 1 disorder ([place])   . Bowel incontinence   . Depression   . Diabetes mellitus without complication ([place])   . Mental disability     Past Surgical History:  Procedure Laterality Date  . FOOT SURGERY    . KNEE ARTHROSCOPY W/ ACL RECONSTRUCTION Left 10/12/2020    There were no vitals filed for this visit.   Subjective Assessment - 11/09/20 1508    Subjective " I am doing better, no pain today."    Currently in Pain? No/denies    Aggravating Factors  walking all day              [facility] PT Assessment - 11/09/20 0001      Assessment   Medical Diagnosis L ACL reconstruction    Referring Provider (PT) Earlie Server, MD      Observation/Other Assessments   Focus on Therapeutic Outcomes (FOTO)  62% function      AROM   Left Knee Extension 20    Left Knee Flexion 103                         [facility] Adult PT Treatment/Exercise - 11/09/20 0001      Knee/Hip Exercises: Stretches   Active Hamstring Stretch 2 reps;30 seconds      Knee/Hip Exercises: Aerobic   Recumbent Bike L1 x 6 min   lowering seat at 3 min to promote knee flexion      Knee/Hip Exercises: Machines for Strengthening   Cybex Leg Press 3 x 10 40# bil LE      Knee/Hip Exercises: Standing   Functional Squat 1 set;10 reps    Gait Training in // forward only x 6 using RUE only to support LLE. 2 x 150 ft using 1 crutch in RUE to support the LLE.   demonstration for proper form                 PT Education - 11/09/20 1513    Education Details reviewed HEP and FOTO Reassessment    Person(s) Educated Patient    Methods Explanation;Verbal cues;Handout    Comprehension Verbalized understanding;Verbal cues required            PT Short Term Goals - 10/19/20 1614      PT SHORT TERM GOAL #1   Title pt to be IND with initial HEP    Status On-going      PT SHORT TERM GOAL #2   Title increase L knee AAROM flexion to >/=  80 degrees for therapeutic progression    Status On-going      PT SHORT TERM GOAL #3   Title pt to ambulate utizing step through pattern with LRAD with </= 7/10 max for safety and improvement with gait efficiency    Status On-going      PT SHORT TERM GOAL #4   Title incrase FOTO score by >/= 6 points to promote improvement in function    Status On-going             PT Long Term Goals - 10/17/20 1253      PT LONG TERM GOAL #1   Title increase knee total arc ROM to >/= 5 - 108 with </= 3/10 pain max for function ROM required for ADLs    Time 8    Period Weeks    Status New    Target Date 12/12/20      PT LONG TERM GOAL #2   Title pt to be able to sit, stand and walk for >/= 45 min with LRAD for inhome and short community distances    Time 8    Period Weeks    Status New    Target Date 12/12/20      PT LONG TERM GOAL #3   Title increase LLE gross strength to >/= 4/5 to promote knee/ hip stability to maximize safety    Time 8    Period Weeks    Status New    Target Date 12/12/20      PT LONG TERM GOAL #4   Title increase FOTO score to >/= 64% to demo improvement in function    Time 8    Period Weeks    Status New     Target Date 12/12/20      PT LONG TERM GOAL #5   Title pt to be IND with all HEP given and is able to maintain and progress current LOF    Time 8    Period Weeks    Status New    Target Date 12/12/20                 Plan - 11/09/20 1543    Clinical Impression Statement Cindy Phillips is making progress with physical theapy increasing both knee flexion/ extension but continues to demo limitations in both ranges. continued working on hip/ knee strengthening which she did well requiring cues for proper form. worked on gait trianing dropping to 1 crutch in the RUE to support the LLE. she did well with todays session and reported no pain end of session.    PT Treatment/Interventions ADLs/Self Care Home Management;Cryotherapy;Electrical Stimulation;Iontophoresis 4mg /ml Dexamethasone;Moist Heat;Ultrasound;Gait training;Stair training;Functional mobility training;Therapeutic activities;Therapeutic exercise;Balance training;Neuromuscular re-education;Patient/family education;Manual techniques;Passive range of motion;Taping;Vasopneumatic Device    PT Next Visit Plan review/ update HEP PRN, . quad activation, knee ROM, gait training, vaso for pain and swelling. stair training, leg press, focuse on knee extension mobs/ patellar mobs.    PT Home Exercise Plan [id number] - quad set, heel slide with strap, seated heel slide, seated heel/ toe raise. mini squat, long sitting quad set with towel roll    Consulted and Agree with Plan of Care Patient           Patient will benefit from skilled therapeutic intervention in order to improve the following deficits and impairments:  Improper body mechanics,Increased muscle spasms,Pain,Decreased activity tolerance,Postural dysfunction,Difficulty walking,Abnormal gait,Decreased strength,Decreased endurance,Increased edema  Visit Diagnosis: Localized edema  Chronic pain of left knee  Other abnormalities of  gait and mobility  Stiffness of left knee, not  elsewhere classified     Problem List Patient Active Problem List   Diagnosis Date Noted  . Pain in left elbow 07/27/2020  . Prediabetes 02/03/2019  . Obstructive sleep apnea 04/23/2018  . Other fatigue 11/26/2017  . Shortness of breath on exertion 11/26/2017  . Type 2 diabetes mellitus without complication, without long-term current use of insulin ([place]) 11/26/2017  . Other hyperlipidemia 11/26/2017  . Vitamin D deficiency 11/26/2017  . Schizoaffective disorder, bipolar type ([place]) 09/14/2014  . Intellectual disability 09/10/2014    [name] PT, DPT, LAT, ATC  11/09/20  3:45 PM      [facility] [address] [place], [place], [address] Phone: [phone]   Fax:  [phone]  Name: Cindy Phillips MRN: [id number] Date of Birth: 05/11/1991

## 2020-11-14 ENCOUNTER — Ambulatory Visit: Payer: Medicare Other

## 2020-11-14 ENCOUNTER — Other Ambulatory Visit: Payer: Self-pay

## 2020-11-14 DIAGNOSIS — R2689 Other abnormalities of gait and mobility: Secondary | ICD-10-CM | POA: Diagnosis not present

## 2020-11-14 DIAGNOSIS — M25662 Stiffness of left knee, not elsewhere classified: Secondary | ICD-10-CM

## 2020-11-14 DIAGNOSIS — G8929 Other chronic pain: Secondary | ICD-10-CM | POA: Diagnosis not present

## 2020-11-14 DIAGNOSIS — R600 Localized edema: Secondary | ICD-10-CM | POA: Diagnosis not present

## 2020-11-14 DIAGNOSIS — M25562 Pain in left knee: Secondary | ICD-10-CM | POA: Diagnosis not present

## 2020-11-14 NOTE — Therapy (Signed)
[place], [place], [address] Phone: [phone]   Fax:  [phone]  Physical Therapy Treatment  Patient Details  Name: Cindy Phillips MRN: [id number] Date of Birth: 11/14/1990 Referring Provider (PT): Earlie Server, MD   Encounter Date: 11/14/2020   PT End of Session - 11/14/20 1448    Visit Number 7    Number of Visits 17    Date for PT Re-Evaluation 12/12/20    Authorization Type MCR: Kx mod 15th visit,  FOTO 6th and 10th visit    Progress Note Due on Visit 10    PT Start Time 9150    PT Stop Time 1532    PT Time Calculation (min) 43 min    Activity Tolerance Patient tolerated treatment well;No increased pain    Behavior During Therapy WFL for tasks assessed/performed           Past Medical History:  Diagnosis Date  . Bipolar 1 disorder ([place])   . Bowel incontinence   . Depression   . Diabetes mellitus without complication ([place])   . Mental disability     Past Surgical History:  Procedure Laterality Date  . FOOT SURGERY    . KNEE ARTHROSCOPY W/ ACL RECONSTRUCTION Left 10/12/2020    There were no vitals filed for this visit.   Subjective Assessment - 11/14/20 1449    Subjective Pt presents to PT with no current reports of pain or discomfort. She has been compliant with her HEP with no adverse effect. Pt is ready to begin PT treatment at this time.    Currently in Pain? No/denies    Pain Score 0-No pain              [facility] PT Assessment - 11/14/20 0001      AROM   Left Knee Extension 14    Left Knee Flexion 106                         [facility] Adult PT Treatment/Exercise - 11/14/20 0001      Knee/Hip Exercises: Aerobic   Recumbent Bike L1 x 5 min      Knee/Hip Exercises: Standing   Terminal Knee Extension 2 sets;10 reps;Theraband    Theraband Level (Terminal Knee Extension) Level 2 (Red)    Forward Step Up 2 sets;10 reps   8in step   Functional Squat 2  sets;10 reps    Other Standing Knee Exercises standing hamstring strep on 8in step 2x3sec      Knee/Hip Exercises: Seated   Sit to Sand 2 sets;10 reps      Knee/Hip Exercises: Supine   Quad Sets 1 set;10 reps   with towel under heel   Heel Slides 5 reps    Heel Slides Limitations using green stretch strap    Bridges 2 sets;10 reps                    PT Short Term Goals - 10/19/20 1614      PT SHORT TERM GOAL #1   Title pt to be IND with initial HEP    Status On-going      PT SHORT TERM GOAL #2   Title increase L knee AAROM flexion to >/= 80 degrees for therapeutic progression    Status On-going      PT SHORT TERM GOAL #3   Title pt to ambulate utizing step through pattern with LRAD with </= 7/10  max for safety and improvement with gait efficiency    Status On-going      PT SHORT TERM GOAL #4   Title incrase FOTO score by >/= 6 points to promote improvement in function    Status On-going             PT Long Term Goals - 10/17/20 1253      PT LONG TERM GOAL #1   Title increase knee total arc ROM to >/= 5 - 108 with </= 3/10 pain max for function ROM required for ADLs    Time 8    Period Weeks    Status New    Target Date 12/12/20      PT LONG TERM GOAL #2   Title pt to be able to sit, stand and walk for >/= 45 min with LRAD for inhome and short community distances    Time 8    Period Weeks    Status New    Target Date 12/12/20      PT LONG TERM GOAL #3   Title increase LLE gross strength to >/= 4/5 to promote knee/ hip stability to maximize safety    Time 8    Period Weeks    Status New    Target Date 12/12/20      PT LONG TERM GOAL #4   Title increase FOTO score to >/= 64% to demo improvement in function    Time 8    Period Weeks    Status New    Target Date 12/12/20      PT LONG TERM GOAL #5   Title pt to be IND with all HEP given and is able to maintain and progress current LOF    Time 8    Period Weeks    Status New    Target Date  12/12/20                 Plan - 11/14/20 1545    Clinical Impression Statement Verbie responded well to PT treatment today, showing improving L knee flex/ext and increased activity tolerance. She continues to have deficits in L LE strength and L knee ROM while also requiring frequent cues for proper technique and sequencing of exercises. Brittni continues to benefit from skilled PT services and should continue to be seen per POC as prescribed and progressed as able.    PT Treatment/Interventions ADLs/Self Care Home Management;Cryotherapy;Electrical Stimulation;Iontophoresis 4mg /ml Dexamethasone;Moist Heat;Ultrasound;Gait training;Stair training;Functional mobility training;Therapeutic activities;Therapeutic exercise;Balance training;Neuromuscular re-education;Patient/family education;Manual techniques;Passive range of motion;Taping;Vasopneumatic Device    PT Next Visit Plan Continue to progres L LE strengthening exercises along with L knee ROM as able    PT Home Exercise Plan [id number] - quad set, heel slide with strap, seated heel slide, seated heel/ toe raise. mini squat, long sitting quad set with towel roll    Consulted and Agree with Plan of Care Patient           Patient will benefit from skilled therapeutic intervention in order to improve the following deficits and impairments:  Improper body mechanics,Increased muscle spasms,Pain,Decreased activity tolerance,Postural dysfunction,Difficulty walking,Abnormal gait,Decreased strength,Decreased endurance,Increased edema  Visit Diagnosis: Localized edema  Chronic pain of left knee  Other abnormalities of gait and mobility  Stiffness of left knee, not elsewhere classified     Problem List Patient Active Problem List   Diagnosis Date Noted  . Pain in left elbow 07/27/2020  . Prediabetes 02/03/2019  . Obstructive sleep apnea 04/23/2018  . Other fatigue 11/26/2017  .  Shortness of breath on exertion 11/26/2017  . Type 2  diabetes mellitus without complication, without long-term current use of insulin ([place]) 11/26/2017  . Other hyperlipidemia 11/26/2017  . Vitamin D deficiency 11/26/2017  . Schizoaffective disorder, bipolar type ([place]) 09/14/2014  . Intellectual disability 09/10/2014    Ward Chatters, PT, DPT 11/14/20 3:50 PM   [facility] [address] [place], [place], [address] Phone: [phone]   Fax:  [phone]  Name: DHAMAR GREGORY MRN: [id number] Date of Birth: [birth year]

## 2020-11-17 DIAGNOSIS — E1165 Type 2 diabetes mellitus with hyperglycemia: Secondary | ICD-10-CM | POA: Diagnosis not present

## 2020-11-17 DIAGNOSIS — E785 Hyperlipidemia, unspecified: Secondary | ICD-10-CM | POA: Diagnosis not present

## 2020-11-22 DIAGNOSIS — Z0000 Encounter for general adult medical examination without abnormal findings: Secondary | ICD-10-CM | POA: Diagnosis not present

## 2020-11-22 DIAGNOSIS — E1169 Type 2 diabetes mellitus with other specified complication: Secondary | ICD-10-CM | POA: Diagnosis not present

## 2020-11-22 DIAGNOSIS — K5900 Constipation, unspecified: Secondary | ICD-10-CM | POA: Diagnosis not present

## 2020-11-23 ENCOUNTER — Other Ambulatory Visit: Payer: Self-pay

## 2020-11-23 ENCOUNTER — Ambulatory Visit: Payer: Medicare Other | Attending: Orthopedic Surgery

## 2020-11-23 DIAGNOSIS — R2689 Other abnormalities of gait and mobility: Secondary | ICD-10-CM

## 2020-11-23 DIAGNOSIS — M25662 Stiffness of left knee, not elsewhere classified: Secondary | ICD-10-CM | POA: Diagnosis not present

## 2020-11-23 DIAGNOSIS — R600 Localized edema: Secondary | ICD-10-CM | POA: Diagnosis not present

## 2020-11-23 DIAGNOSIS — M25562 Pain in left knee: Secondary | ICD-10-CM | POA: Insufficient documentation

## 2020-11-23 DIAGNOSIS — G8929 Other chronic pain: Secondary | ICD-10-CM

## 2020-11-23 NOTE — Therapy (Signed)
[place], [place], [address] Phone: [phone]   Fax:  [phone]  Physical Therapy Treatment  Patient Details  Name: Cindy Phillips MRN: [id number] Date of Birth: May 22, 1991 Referring Provider (PT): Earlie Server, MD   Encounter Date: 11/23/2020   PT End of Session - 11/23/20 1737    Visit Number 8    Number of Visits 17    Date for PT Re-Evaluation 12/12/20    Authorization Type MCR: Kx mod 15th visit,  FOTO 6th and 10th visit    Progress Note Due on Visit 10    PT Start Time 4580    PT Stop Time 1828    PT Time Calculation (min) 48 min    Activity Tolerance Patient tolerated treatment well;No increased pain    Behavior During Therapy WFL for tasks assessed/performed           Past Medical History:  Diagnosis Date  . Bipolar 1 disorder ([place])   . Bowel incontinence   . Depression   . Diabetes mellitus without complication ([place])   . Mental disability     Past Surgical History:  Procedure Laterality Date  . FOOT SURGERY    . KNEE ARTHROSCOPY W/ ACL RECONSTRUCTION Left 10/12/2020    There were no vitals filed for this visit.   Subjective Assessment - 11/23/20 1738    Subjective Pt presents to PT with reports of increased L knee pain and soreness. Pt states she had to walk about 30 minutes yesterday to catch the bus to another appointment. Pt has been compliant with her HEP with no adverse effects noted thus far. She is ready to begin PT treatment at this time.    Currently in Pain? Yes    Pain Score 8     Pain Location Knee    Pain Orientation Left                             [facility] Adult PT Treatment/Exercise - 11/23/20 0001      Knee/Hip Exercises: Aerobic   Recumbent Bike L1 x 5 min while taking subjective      Knee/Hip Exercises: Machines for Strengthening   Cybex Leg Press 3 x 10 60# bil LE      Knee/Hip Exercises: Standing   Terminal Knee Extension 2  sets;10 reps;Theraband    Theraband Level (Terminal Knee Extension) Level 3 (Green)    Forward Step Up 2 sets;10 reps;Left    Forward Step Up Limitations 8in    Functional Squat 2 sets;10 reps      Knee/Hip Exercises: Seated   Sit to Sand 2 sets;10 reps;without UE support                    PT Short Term Goals - 10/19/20 1614      PT SHORT TERM GOAL #1   Title pt to be IND with initial HEP    Status On-going      PT SHORT TERM GOAL #2   Title increase L knee AAROM flexion to >/= 80 degrees for therapeutic progression    Status On-going      PT SHORT TERM GOAL #3   Title pt to ambulate utizing step through pattern with LRAD with </= 7/10 max for safety and improvement with gait efficiency    Status On-going      PT SHORT TERM GOAL #4   Title incrase FOTO  score by >/= 6 points to promote improvement in function    Status On-going             PT Long Term Goals - 10/17/20 1253      PT LONG TERM GOAL #1   Title increase knee total arc ROM to >/= 5 - 108 with </= 3/10 pain max for function ROM required for ADLs    Time 8    Period Weeks    Status New    Target Date 12/12/20      PT LONG TERM GOAL #2   Title pt to be able to sit, stand and walk for >/= 45 min with LRAD for inhome and short community distances    Time 8    Period Weeks    Status New    Target Date 12/12/20      PT LONG TERM GOAL #3   Title increase LLE gross strength to >/= 4/5 to promote knee/ hip stability to maximize safety    Time 8    Period Weeks    Status New    Target Date 12/12/20      PT LONG TERM GOAL #4   Title increase FOTO score to >/= 64% to demo improvement in function    Time 8    Period Weeks    Status New    Target Date 12/12/20      PT LONG TERM GOAL #5   Title pt to be IND with all HEP given and is able to maintain and progress current LOF    Time 8    Period Weeks    Status New    Target Date 12/12/20                 Plan - 11/23/20 1818     Clinical Impression Statement Pt once again responded well to PT treamtent, with no increase in pain or adverse effect. She continues to improve her L LE strength, but does continue to have deficits in L knee ROM and quad control. PT should continue to work on progressing L LE strength and increasing L knee AROM.    PT Treatment/Interventions ADLs/Self Care Home Management;Cryotherapy;Electrical Stimulation;Iontophoresis 4mg /ml Dexamethasone;Moist Heat;Ultrasound;Gait training;Stair training;Functional mobility training;Therapeutic activities;Therapeutic exercise;Balance training;Neuromuscular re-education;Patient/family education;Manual techniques;Passive range of motion;Taping;Vasopneumatic Device    PT Next Visit Plan Continue to progres L LE strengthening exercises along with L knee ROM as able    PT Home Exercise Plan [id number] - quad set, heel slide with strap, seated heel slide, seated heel/ toe raise. mini squat, long sitting quad set with towel roll           Patient will benefit from skilled therapeutic intervention in order to improve the following deficits and impairments:  Improper body mechanics,Increased muscle spasms,Pain,Decreased activity tolerance,Postural dysfunction,Difficulty walking,Abnormal gait,Decreased strength,Decreased endurance,Increased edema  Visit Diagnosis: Localized edema  Chronic pain of left knee  Other abnormalities of gait and mobility  Stiffness of left knee, not elsewhere classified     Problem List Patient Active Problem List   Diagnosis Date Noted  . Pain in left elbow 07/27/2020  . Prediabetes 02/03/2019  . Obstructive sleep apnea 04/23/2018  . Other fatigue 11/26/2017  . Shortness of breath on exertion 11/26/2017  . Type 2 diabetes mellitus without complication, without long-term current use of insulin ([place]) 11/26/2017  . Other hyperlipidemia 11/26/2017  . Vitamin D deficiency 11/26/2017  . Schizoaffective disorder, bipolar type ([place])  09/14/2014  . Intellectual disability 09/10/2014  Ward Chatters, PT, DPT 11/23/20 7:25 PM  [facility] [address] [place], [place], [address] Phone: [phone]   Fax:  [phone]  Name: Cindy Phillips MRN: [id number] Date of Birth: [birth year]

## 2020-11-29 ENCOUNTER — Other Ambulatory Visit: Payer: Self-pay

## 2020-11-29 ENCOUNTER — Ambulatory Visit: Payer: Medicare Other | Admitting: Physical Therapy

## 2020-11-29 DIAGNOSIS — M25562 Pain in left knee: Secondary | ICD-10-CM

## 2020-11-29 DIAGNOSIS — R600 Localized edema: Secondary | ICD-10-CM | POA: Diagnosis not present

## 2020-11-29 DIAGNOSIS — G8929 Other chronic pain: Secondary | ICD-10-CM | POA: Diagnosis not present

## 2020-11-29 DIAGNOSIS — M25662 Stiffness of left knee, not elsewhere classified: Secondary | ICD-10-CM

## 2020-11-29 DIAGNOSIS — R2689 Other abnormalities of gait and mobility: Secondary | ICD-10-CM

## 2020-11-29 NOTE — Therapy (Signed)
[place], [place], [address] Phone: [phone]   Fax:  [phone]  Physical Therapy Treatment  Patient Details  Name: Cindy Phillips MRN: [id number] Date of Birth: 07/06/1991 Referring Provider (PT): Earlie Server, MD   Encounter Date: 11/29/2020   PT End of Session - 11/29/20 1536    Visit Number 9    Number of Visits 17    Date for PT Re-Evaluation 12/12/20    Authorization Type MCR: Kx mod 15th visit,  FOTO 6th and 10th visit    Progress Note Due on Visit 10    PT Start Time 1535    PT Stop Time 1617    PT Time Calculation (min) 42 min    Activity Tolerance Patient tolerated treatment well;No increased pain    Behavior During Therapy WFL for tasks assessed/performed           Past Medical History:  Diagnosis Date  . Bipolar 1 disorder ([place])   . Bowel incontinence   . Depression   . Diabetes mellitus without complication ([place])   . Mental disability     Past Surgical History:  Procedure Laterality Date  . FOOT SURGERY    . KNEE ARTHROSCOPY W/ ACL RECONSTRUCTION Left 10/12/2020    There were no vitals filed for this visit.   Subjective Assessment - 11/29/20 1537    Subjective "I am working on getting my knee moving better."              [facility] PT Assessment - 11/29/20 0001      Assessment   Medical Diagnosis L ACL reconstruction    Referring Provider (PT) Earlie Server, MD                         [facility] Adult PT Treatment/Exercise - 11/29/20 0001      Knee/Hip Exercises: Aerobic   Recumbent Bike L5 x 6 min   lowering seat at 3 min     Knee/Hip Exercises: Machines for Strengthening   Cybex Leg Press 2 x 10 60#, 2 x 15 60# con bil/ ecc LLE      Knee/Hip Exercises: Standing   Forward Step Up 2 sets;15 reps;Step Height: 8"    Gait Training x 5 walking by the coutner, 1 x 185 ft without crutch, using metronome set at 90 beats 4 x 20 ft . then 85 beats 4 x  20 ft                    PT Short Term Goals - 10/19/20 1614      PT SHORT TERM GOAL #1   Title pt to be IND with initial HEP    Status On-going      PT SHORT TERM GOAL #2   Title increase L knee AAROM flexion to >/= 80 degrees for therapeutic progression    Status On-going      PT SHORT TERM GOAL #3   Title pt to ambulate utizing step through pattern with LRAD with </= 7/10 max for safety and improvement with gait efficiency    Status On-going      PT SHORT TERM GOAL #4   Title incrase FOTO score by >/= 6 points to promote improvement in function    Status On-going             PT Long Term Goals - 10/17/20 1253      PT LONG TERM  GOAL #1   Title increase knee total arc ROM to >/= 5 - 108 with </= 3/10 pain max for function ROM required for ADLs    Time 8    Period Weeks    Status New    Target Date 12/12/20      PT LONG TERM GOAL #2   Title pt to be able to sit, stand and walk for >/= 45 min with LRAD for inhome and short community distances    Time 8    Period Weeks    Status New    Target Date 12/12/20      PT LONG TERM GOAL #3   Title increase LLE gross strength to >/= 4/5 to promote knee/ hip stability to maximize safety    Time 8    Period Weeks    Status New    Target Date 12/12/20      PT LONG TERM GOAL #4   Title increase FOTO score to >/= 64% to demo improvement in function    Time 8    Period Weeks    Status New    Target Date 12/12/20      PT LONG TERM GOAL #5   Title pt to be IND with all HEP given and is able to maintain and progress current LOF    Time 8    Period Weeks    Status New    Target Date 12/12/20                 Plan - 11/29/20 1617    Clinical Impression Statement pt reports no pain today and continues to respond well to Pt today. continued working on gross hip/ knee strengthening which she does well but required frequent mod verbal cues for proper form. focused session on gait training without AD which she  is able to do but favors the L knee, trialed a multi-modal approach with demonstration, cues, metronome which she continues to exhibit difficulty reducing her antalgic pattern. no pain noted at end of session.    PT Treatment/Interventions ADLs/Self Care Home Management;Cryotherapy;Electrical Stimulation;Iontophoresis 4mg /ml Dexamethasone;Moist Heat;Ultrasound;Gait training;Stair training;Functional mobility training;Therapeutic activities;Therapeutic exercise;Balance training;Neuromuscular re-education;Patient/family education;Manual techniques;Passive range of motion;Taping;Vasopneumatic Device    PT Home Exercise Plan [id number] - quad set, heel slide with strap, seated heel slide, seated heel/ toe raise. mini squat, long sitting quad set with towel roll    Consulted and Agree with Plan of Care Patient           Patient will benefit from skilled therapeutic intervention in order to improve the following deficits and impairments:  Improper body mechanics,Increased muscle spasms,Pain,Decreased activity tolerance,Postural dysfunction,Difficulty walking,Abnormal gait,Decreased strength,Decreased endurance,Increased edema  Visit Diagnosis: Localized edema  Chronic pain of left knee  Other abnormalities of gait and mobility  Stiffness of left knee, not elsewhere classified     Problem List Patient Active Problem List   Diagnosis Date Noted  . Pain in left elbow 07/27/2020  . Prediabetes 02/03/2019  . Obstructive sleep apnea 04/23/2018  . Other fatigue 11/26/2017  . Shortness of breath on exertion 11/26/2017  . Type 2 diabetes mellitus without complication, without long-term current use of insulin ([place]) 11/26/2017  . Other hyperlipidemia 11/26/2017  . Vitamin D deficiency 11/26/2017  . Schizoaffective disorder, bipolar type ([place]) 09/14/2014  . Intellectual disability 09/10/2014   [name] PT, DPT, LAT, ATC  11/29/20  4:23 PM      [facility]  [address] [place],  [place], [address] Phone: [phone]   Fax:  [phone]  Name: Cindy Phillips MRN: [id number] Date of Birth: 14-Nov-1990

## 2020-11-30 DIAGNOSIS — Z23 Encounter for immunization: Secondary | ICD-10-CM | POA: Diagnosis not present

## 2020-12-20 ENCOUNTER — Ambulatory Visit: Payer: Medicare Other | Attending: Orthopedic Surgery

## 2020-12-20 ENCOUNTER — Other Ambulatory Visit: Payer: Self-pay

## 2020-12-20 DIAGNOSIS — R600 Localized edema: Secondary | ICD-10-CM | POA: Insufficient documentation

## 2020-12-20 DIAGNOSIS — R2689 Other abnormalities of gait and mobility: Secondary | ICD-10-CM | POA: Insufficient documentation

## 2020-12-20 DIAGNOSIS — M25562 Pain in left knee: Secondary | ICD-10-CM | POA: Diagnosis not present

## 2020-12-20 DIAGNOSIS — G8929 Other chronic pain: Secondary | ICD-10-CM | POA: Diagnosis not present

## 2020-12-20 DIAGNOSIS — M25662 Stiffness of left knee, not elsewhere classified: Secondary | ICD-10-CM | POA: Diagnosis not present

## 2020-12-20 NOTE — Therapy (Addendum)
[place], [place], [address] Phone: [phone]   Fax:  [phone]  Physical Therapy Treatment/Progress Note  Patient Details  Name: Cindy Phillips MRN: [id number] Date of Birth: 1991/01/09 Referring Provider (PT): Earlie Server, MD   Encounter Date: 12/20/2020   PT End of Session - 12/20/20 1401    Visit Number 10    Number of Visits 17    Date for PT Re-Evaluation 12/12/20    Authorization Type MCR: Kx mod 15th visit,  FOTO 6th and 10th visit    Progress Note Due on Visit 10    PT Start Time 0932    PT Stop Time 1500    PT Time Calculation (min) 45 min    Activity Tolerance Patient tolerated treatment well;No increased pain    Behavior During Therapy WFL for tasks assessed/performed           Past Medical History:  Diagnosis Date  . Bipolar 1 disorder ([place])   . Bowel incontinence   . Depression   . Diabetes mellitus without complication ([place])   . Mental disability     Past Surgical History:  Procedure Laterality Date  . FOOT SURGERY    . KNEE ARTHROSCOPY W/ ACL RECONSTRUCTION Left 10/12/2020    There were no vitals filed for this visit.   Subjective Assessment - 12/20/20 1416    Subjective Pt presents to PT with no reports of L knee pain, but does promote stiffness. She has still been using brace and axillary crutches. Pt has been compliant with her HEP with no adverse effect. She is ready to begin PT treatment at this time.    Currently in Pain? No/denies    Pain Score 0-No pain              [facility] PT Assessment - 12/20/20 0001      AROM   Left Knee Extension 6    Left Knee Flexion 110                         [facility] Adult PT Treatment/Exercise - 12/20/20 0001      Ambulation/Gait   Gait Comments pt ambulated in // x 5 laps without UE support working on improving ambulation without AD; continues to have deficits in L knee extension with antalgic gait on L  stance and L trendelenburg      Knee/Hip Exercises: Aerobic   Recumbent Bike L3 x 5 min while taking subjective      Knee/Hip Exercises: Standing   Terminal Knee Extension 15 reps;Left    Theraband Level (Terminal Knee Extension) Level 2 (Red)    Hip Extension 2 sets;10 reps;Both    Extension Limitations red tband    Other Standing Knee Exercises lateral walk x 3 laps red tband      Knee/Hip Exercises: Seated   Sit to Sand 2 sets;10 reps;without UE support      Knee/Hip Exercises: Supine   Quad Sets 1 set;10 reps;Left    Quad Sets Limitations towel at heel    Heel Slides 10 reps;Left    Heel Slides Limitations using green stretch strap                    PT Short Term Goals - 12/20/20 1441      PT SHORT TERM GOAL #1   Title pt to be IND with initial HEP    Status Achieved  PT SHORT TERM GOAL #2   Title increase L knee AAROM flexion to >/= 80 degrees for therapeutic progression    Baseline see flowsheet    Status Achieved      PT SHORT TERM GOAL #3   Title pt to ambulate utizing step through pattern with LRAD with </= 7/10 max for safety and improvement with gait efficiency    Baseline 12/20/20 - continues to ambulate with bariatric axillary crutch, no pain    Status Achieved      PT SHORT TERM GOAL #4   Title incrase FOTO score by >/= 6 points to promote improvement in function    Baseline goal met on 11/09/20 - 62% from 55%    Status Achieved             PT Long Term Goals - 12/20/20 1457      PT LONG TERM GOAL #1   Title increase knee total arc ROM to >/= 5 - 108 with </= 3/10 pain max for function ROM required for ADLs    Baseline see flowsheet    Time 8    Period Weeks    Status On-going    Target Date 01/31/21      PT LONG TERM GOAL #2   Title pt to be able to sit, stand and walk for >/= 45 min with LRAD for inhome and short community distances    Baseline ongoing    Time 8    Period Weeks    Status On-going      PT LONG TERM GOAL #3    Title increase LLE gross strength to >/= 4/5 to promote knee/ hip stability to maximize safety    Time 8    Period Weeks    Status On-going      PT LONG TERM GOAL #4   Title increase FOTO score to >/= 64% to demo improvement in function    Baseline 12/20/20 - 62%    Time 8    Period Weeks    Status On-going      PT LONG TERM GOAL #5   Title pt to be IND with all HEP given and is able to maintain and progress current LOF    Time 8    Period Weeks    Status On-going                 Plan - 12/20/20 1512    Clinical Impression Statement Cindy Phillips was able to complete all prescribed exercises with no adverse effect or increase in pain. Although she continues to have weakness in L quad and L proximal hip, she was able to progress L knee ROM today meeting her STG and nearing completion of her LTG. While she continues to ambulation with bariatric axillary crutch, she does show improved ambulation without AD, although she is limited by decreased L LE strength. Cindy Phillips continues to benefit from skilled PT services and should continue to be seen per POC as prescribed.    Personal Factors and Comorbidities Comorbidity 2    Comorbidities hx of depression, mental disability    PT Frequency 2x / week    PT Duration 6 weeks    PT Treatment/Interventions ADLs/Self Care Home Management;Cryotherapy;Electrical Stimulation;Iontophoresis 79m/ml Dexamethasone;Moist Heat;Ultrasound;Gait training;Stair training;Functional mobility training;Therapeutic activities;Therapeutic exercise;Balance training;Neuromuscular re-education;Patient/family education;Manual techniques;Passive range of motion;Taping;Vasopneumatic Device    PT Next Visit Plan Continue to progres L LE strengthening exercises along with L knee ROM as able    PT Home Exercise Plan [id number] -  quad set, heel slide with strap, seated heel slide, seated heel/ toe raise. mini squat, long sitting quad set with towel roll    Consulted and Agree with  Plan of Care Patient           Patient will benefit from skilled therapeutic intervention in order to improve the following deficits and impairments:  Improper body mechanics,Increased muscle spasms,Pain,Decreased activity tolerance,Postural dysfunction,Difficulty walking,Abnormal gait,Decreased strength,Decreased endurance,Increased edema  Visit Diagnosis: Localized edema - Plan: PT plan of care cert/re-cert  Chronic pain of left knee - Plan: PT plan of care cert/re-cert  Other abnormalities of gait and mobility - Plan: PT plan of care cert/re-cert  Stiffness of left knee, not elsewhere classified - Plan: PT plan of care cert/re-cert     Problem List Patient Active Problem List   Diagnosis Date Noted  . Pain in left elbow 07/27/2020  . Prediabetes 02/03/2019  . Obstructive sleep apnea 04/23/2018  . Other fatigue 11/26/2017  . Shortness of breath on exertion 11/26/2017  . Type 2 diabetes mellitus without complication, without long-term current use of insulin ([place]) 11/26/2017  . Other hyperlipidemia 11/26/2017  . Vitamin D deficiency 11/26/2017  . Schizoaffective disorder, bipolar type ([place]) 09/14/2014  . Intellectual disability 09/10/2014    Ward Chatters, PT, DPT 12/20/20 3:26 PM  [facility] [address] [place], [place], [address] Phone: [phone]   Fax:  [phone]  Name: MONTE BRONDER MRN: [id number] Date of Birth: 1990/09/14

## 2020-12-30 ENCOUNTER — Encounter: Payer: Self-pay | Admitting: Physical Therapy

## 2020-12-30 ENCOUNTER — Ambulatory Visit: Payer: Medicare Other | Admitting: Physical Therapy

## 2020-12-30 ENCOUNTER — Other Ambulatory Visit: Payer: Self-pay

## 2020-12-30 DIAGNOSIS — M25562 Pain in left knee: Secondary | ICD-10-CM | POA: Diagnosis not present

## 2020-12-30 DIAGNOSIS — R2689 Other abnormalities of gait and mobility: Secondary | ICD-10-CM

## 2020-12-30 DIAGNOSIS — M25662 Stiffness of left knee, not elsewhere classified: Secondary | ICD-10-CM | POA: Diagnosis not present

## 2020-12-30 DIAGNOSIS — R600 Localized edema: Secondary | ICD-10-CM | POA: Diagnosis not present

## 2020-12-30 DIAGNOSIS — G8929 Other chronic pain: Secondary | ICD-10-CM | POA: Diagnosis not present

## 2020-12-30 NOTE — Therapy (Signed)
[place], [place], [address] Phone: [phone]   Fax:  [phone]  Physical Therapy Treatment  Patient Details  Name: Cindy Phillips MRN: [id number] Date of Birth: 1991/03/26 Referring Provider (PT): Earlie Server, MD   Encounter Date: 12/30/2020   PT End of Session - 12/30/20 1404    Visit Number 11    Number of Visits 17    Date for PT Re-Evaluation 02/03/21    Authorization Type MCR: Kx mod 15th visit,    Progress Note Due on Visit 10    PT Start Time 1400    PT Stop Time 1441    PT Time Calculation (min) 41 min    Activity Tolerance Patient tolerated treatment well;No increased pain    Behavior During Therapy WFL for tasks assessed/performed           Past Medical History:  Diagnosis Date  . Bipolar 1 disorder ([place])   . Bowel incontinence   . Depression   . Diabetes mellitus without complication ([place])   . Mental disability     Past Surgical History:  Procedure Laterality Date  . FOOT SURGERY    . KNEE ARTHROSCOPY W/ ACL RECONSTRUCTION Left 10/12/2020    There were no vitals filed for this visit.   Subjective Assessment - 12/30/20 1440    Subjective "I am doing pretty good, I have alot of energy today and I am ready to go."    Patient Stated Goals get the knee to heal well, decrease pain, increase the strength    Currently in Pain? No/denies              [facility] PT Assessment - 12/30/20 0001      Assessment   Medical Diagnosis L ACL reconstruction    Referring Provider (PT) Earlie Server, MD                         [facility] Adult PT Treatment/Exercise - 12/30/20 0001      Knee/Hip Exercises: Aerobic   Recumbent Bike L4 x 5 min      Knee/Hip Exercises: Standing   Hip Extension 2 sets;10 reps;Knee straight    Extension Limitations green theraband    Forward Step Up 2 sets;15 reps;Step Height: 6"    Step Down 2 sets;Left;Step Height: 6"    Functional  Squat 2 sets;10 reps    Gait Training forward / backward 10 ea. in //    Other Standing Knee Exercises in // 5 x bil with green theraband arouned      Knee/Hip Exercises: Seated   Long Arc Quad Strengthening;Left;20 reps;2 sets   4#                 PT Education - 12/30/20 1419    Education Details reviewed HEP and plan to stop using the crutch but continue using the brace    Person(s) Educated Patient    Methods Explanation;Verbal cues    Comprehension Verbalized understanding;Verbal cues required            PT Short Term Goals - 12/20/20 1441      PT SHORT TERM GOAL #1   Title pt to be IND with initial HEP    Status Achieved      PT SHORT TERM GOAL #2   Title increase L knee AAROM flexion to >/= 80 degrees for therapeutic progression    Baseline see flowsheet    Status  Achieved      PT SHORT TERM GOAL #3   Title pt to ambulate utizing step through pattern with LRAD with </= 7/10 max for safety and improvement with gait efficiency    Baseline 12/20/20 - continues to ambulate with bariatric axillary crutch, no pain    Status Achieved      PT SHORT TERM GOAL #4   Title incrase FOTO score by >/= 6 points to promote improvement in function    Baseline goal met on 11/09/20 - 62% from 55%    Status Achieved             PT Long Term Goals - 12/20/20 1457      PT LONG TERM GOAL #1   Title increase knee total arc ROM to >/= 5 - 108 with </= 3/10 pain max for function ROM required for ADLs    Baseline see flowsheet    Time 8    Period Weeks    Status On-going    Target Date 01/31/21      PT LONG TERM GOAL #2   Title pt to be able to sit, stand and walk for >/= 45 min with LRAD for inhome and short community distances    Baseline ongoing    Time 8    Period Weeks    Status On-going      PT LONG TERM GOAL #3   Title increase LLE gross strength to >/= 4/5 to promote knee/ hip stability to maximize safety    Time 8    Period Weeks    Status On-going      PT  LONG TERM GOAL #4   Title increase FOTO score to >/= 64% to demo improvement in function    Baseline 12/20/20 - 62%    Time 8    Period Weeks    Status On-going      PT LONG TERM GOAL #5   Title pt to be IND with all HEP given and is able to maintain and progress current LOF    Time 8    Period Weeks    Status On-going                 Plan - 12/30/20 1420    Clinical Impression Statement pt arrives in good spirits with a good amount of energy, she reports no pain. continued working on gross hip/ knee strengthening. practiced gait training in the // walk forward / backward using mirror for visual feedback. discussed working on stopping use of her crutch but conitnue use of her leg brace. progress exercise to [facility] which she did very well with. End of session she report no pain.    PT Treatment/Interventions ADLs/Self Care Home Management;Cryotherapy;Electrical Stimulation;Iontophoresis 47m/ml Dexamethasone;Moist Heat;Ultrasound;Gait training;Stair training;Functional mobility training;Therapeutic activities;Therapeutic exercise;Balance training;Neuromuscular re-education;Patient/family education;Manual techniques;Passive range of motion;Taping;Vasopneumatic Device    PT Next Visit Plan Continue to progres L LE strengthening exercises along with L knee ROM as able    PT Home Exercise Plan [id number] - quad set, heel slide with strap, seated heel slide, seated heel/ toe raise. mini squat, long sitting quad set with towel roll    Consulted and Agree with Plan of Care Patient           Patient will benefit from skilled therapeutic intervention in order to improve the following deficits and impairments:  Improper body mechanics,Increased muscle spasms,Pain,Decreased activity tolerance,Postural dysfunction,Difficulty walking,Abnormal gait,Decreased strength,Decreased endurance,Increased edema  Visit Diagnosis: Localized edema  Chronic pain of left  knee  Other abnormalities of gait and  mobility  Stiffness of left knee, not elsewhere classified     Problem List Patient Active Problem List   Diagnosis Date Noted  . Pain in left elbow 07/27/2020  . Prediabetes 02/03/2019  . Obstructive sleep apnea 04/23/2018  . Other fatigue 11/26/2017  . Shortness of breath on exertion 11/26/2017  . Type 2 diabetes mellitus without complication, without long-term current use of insulin ([place]) 11/26/2017  . Other hyperlipidemia 11/26/2017  . Vitamin D deficiency 11/26/2017  . Schizoaffective disorder, bipolar type ([place]) 09/14/2014  . Intellectual disability 09/10/2014   [name] PT, DPT, LAT, ATC  12/30/20  2:42 PM      [facility] [address] [place], [place], [address] Phone: [phone]   Fax:  [phone]  Name: Cindy Phillips MRN: [id number] Date of Birth: 12-27-1990

## 2021-01-02 ENCOUNTER — Encounter: Payer: Self-pay | Admitting: Physical Therapy

## 2021-01-02 ENCOUNTER — Other Ambulatory Visit: Payer: Self-pay

## 2021-01-02 ENCOUNTER — Ambulatory Visit: Payer: Medicare Other | Admitting: Physical Therapy

## 2021-01-02 DIAGNOSIS — G8929 Other chronic pain: Secondary | ICD-10-CM | POA: Diagnosis not present

## 2021-01-02 DIAGNOSIS — M25662 Stiffness of left knee, not elsewhere classified: Secondary | ICD-10-CM

## 2021-01-02 DIAGNOSIS — R2689 Other abnormalities of gait and mobility: Secondary | ICD-10-CM

## 2021-01-02 DIAGNOSIS — M25562 Pain in left knee: Secondary | ICD-10-CM | POA: Diagnosis not present

## 2021-01-02 DIAGNOSIS — R600 Localized edema: Secondary | ICD-10-CM

## 2021-01-02 NOTE — Therapy (Signed)
[place], [place], [address] Phone: [phone]   Fax:  [phone]  Physical Therapy Treatment  Patient Details  Name: Cindy Phillips MRN: [id number] Date of Birth: 1991-05-06 Referring Provider (PT): Earlie Server, MD   Encounter Date: 01/02/2021   PT End of Session - 01/02/21 1549    Visit Number 12    Number of Visits 17    Date for PT Re-Evaluation 02/03/21    Authorization Type MCR: Kx mod 15th visit,    Progress Note Due on Visit 20    PT Start Time 1545    PT Stop Time 1626    PT Time Calculation (min) 41 min    Activity Tolerance Patient tolerated treatment well;No increased pain    Behavior During Therapy WFL for tasks assessed/performed           Past Medical History:  Diagnosis Date  . Bipolar 1 disorder ([place])   . Bowel incontinence   . Depression   . Diabetes mellitus without complication ([place])   . Mental disability     Past Surgical History:  Procedure Laterality Date  . FOOT SURGERY    . KNEE ARTHROSCOPY W/ ACL RECONSTRUCTION Left 10/12/2020    There were no vitals filed for this visit.   Subjective Assessment - 01/02/21 1550    Subjective "I am doing pretty good."    Patient Stated Goals get the knee to heal well, decrease pain, increase the strength    Currently in Pain? No/denies    Pain Onset 1 to 4 weeks ago    Aggravating Factors  n/A    Pain Relieving Factors medication              [facility] PT Assessment - 01/02/21 0001      Assessment   Medical Diagnosis L ACL reconstruction    Referring Provider (PT) Earlie Server, MD      AROM   Left Knee Extension 6    Left Knee Flexion 111                         [facility] Adult PT Treatment/Exercise - 01/02/21 0001      Knee/Hip Exercises: Aerobic   Recumbent Bike L5 x 5 min      Knee/Hip Exercises: Machines for Strengthening   Cybex Leg Press 2 x 15 60#, 2 x 16 60# con bil/ ecc LLE       Knee/Hip Exercises: Standing   Forward Step Up 2 sets;Step Height: 8";Both    Wall Squat 2 sets;10 reps    Gait Training heel strike/ toe off 6 x 50 ft cues for reciprocal arm swing.      Knee/Hip Exercises: Seated   Long Arc Quad 2 sets;15 reps    Long Arc Quad Weight 5 lbs.      Knee/Hip Exercises: Supine   Straight Leg Raises 2 sets;15 reps                    PT Short Term Goals - 12/20/20 1441      PT SHORT TERM GOAL #1   Title pt to be IND with initial HEP    Status Achieved      PT SHORT TERM GOAL #2   Title increase L knee AAROM flexion to >/= 80 degrees for therapeutic progression    Baseline see flowsheet    Status Achieved      PT SHORT  TERM GOAL #3   Title pt to ambulate utizing step through pattern with LRAD with </= 7/10 max for safety and improvement with gait efficiency    Baseline 12/20/20 - continues to ambulate with bariatric axillary crutch, no pain    Status Achieved      PT SHORT TERM GOAL #4   Title incrase FOTO score by >/= 6 points to promote improvement in function    Baseline goal met on 11/09/20 - 62% from 55%    Status Achieved             PT Long Term Goals - 12/20/20 1457      PT LONG TERM GOAL #1   Title increase knee total arc ROM to >/= 5 - 108 with </= 3/10 pain max for function ROM required for ADLs    Baseline see flowsheet    Time 8    Period Weeks    Status On-going    Target Date 01/31/21      PT LONG TERM GOAL #2   Title pt to be able to sit, stand and walk for >/= 45 min with LRAD for inhome and short community distances    Baseline ongoing    Time 8    Period Weeks    Status On-going      PT LONG TERM GOAL #3   Title increase LLE gross strength to >/= 4/5 to promote knee/ hip stability to maximize safety    Time 8    Period Weeks    Status On-going      PT LONG TERM GOAL #4   Title increase FOTO score to >/= 64% to demo improvement in function    Baseline 12/20/20 - 62%    Time 8    Period Weeks     Status On-going      PT LONG TERM GOAL #5   Title pt to be IND with all HEP given and is able to maintain and progress current LOF    Time 8    Period Weeks    Status On-going                 Plan - 01/02/21 1617    Clinical Impression Statement pt continues to report no pain or issues today. she arrived without using her [facility] but continues to use the brace. Continued working on gait training to reduce antalgic pattern with is likely related to habit vs a response to pain. continued progression of strengthening working in [employer] [facility] [employer] [facility] which she continues to make progress.    PT Treatment/Interventions ADLs/Self Care Home Management;Cryotherapy;Electrical Stimulation;Iontophoresis 85m/ml Dexamethasone;Moist Heat;Ultrasound;Gait training;Stair training;Functional mobility training;Therapeutic activities;Therapeutic exercise;Balance training;Neuromuscular re-education;Patient/family education;Manual techniques;Passive range of motion;Taping;Vasopneumatic Device    PT Next Visit Plan Continue to progres L LE strengthening [facility]    Consulted and Agree with Plan of Care Patient           Patient will benefit from skilled therapeutic intervention in order to improve the following deficits and impairments:  Improper body mechanics,Increased muscle spasms,Pain,Decreased activity tolerance,Postural dysfunction,Difficulty walking,Abnormal gait,Decreased strength,Decreased endurance,Increased edema  Visit Diagnosis: Localized edema  Chronic pain of left knee  Other abnormalities of gait and mobility  Stiffness of left knee, not elsewhere classified     Problem List Patient Active Problem List   Diagnosis Date Noted  . Pain in left elbow 07/27/2020  . Prediabetes 02/03/2019  . Obstructive sleep apnea 04/23/2018  . Other fatigue 11/26/2017  . Shortness of  breath on exertion 11/26/2017  . Type 2 diabetes mellitus without complication, without long-term current use of insulin  ([place]) 11/26/2017  . Other hyperlipidemia 11/26/2017  . Vitamin D deficiency 11/26/2017  . Schizoaffective disorder, bipolar type ([place]) 09/14/2014  . Intellectual disability 09/10/2014   [name] PT, DPT, LAT, ATC  01/02/21  4:28 PM      [facility] [address] [place], [place], [address] Phone: [phone]   Fax:  [phone]  Name: Cindy Phillips MRN: [id number] Date of Birth: 05-01-1991

## 2021-01-09 ENCOUNTER — Ambulatory Visit: Payer: Medicare Other | Admitting: Physical Therapy

## 2021-01-09 ENCOUNTER — Other Ambulatory Visit: Payer: Self-pay

## 2021-01-09 ENCOUNTER — Encounter: Payer: Self-pay | Admitting: Physical Therapy

## 2021-01-09 DIAGNOSIS — M25562 Pain in left knee: Secondary | ICD-10-CM | POA: Diagnosis not present

## 2021-01-09 DIAGNOSIS — G8929 Other chronic pain: Secondary | ICD-10-CM

## 2021-01-09 DIAGNOSIS — M25662 Stiffness of left knee, not elsewhere classified: Secondary | ICD-10-CM

## 2021-01-09 DIAGNOSIS — R2689 Other abnormalities of gait and mobility: Secondary | ICD-10-CM | POA: Diagnosis not present

## 2021-01-09 DIAGNOSIS — R600 Localized edema: Secondary | ICD-10-CM | POA: Diagnosis not present

## 2021-01-09 NOTE — Therapy (Signed)
[place], [place], [address] Phone: [phone]   Fax:  [phone]  Physical Therapy Treatment  Patient Details  Name: Cindy Phillips MRN: [id number] Date of Birth: July 01, 1991 Referring Provider (PT): Earlie Server, MD   Encounter Date: 01/09/2021   PT End of Session - 01/09/21 1554    Visit Number 13    Number of Visits 17    Date for PT Re-Evaluation 02/03/21    PT Start Time 6967    PT Stop Time 1630    PT Time Calculation (min) 40 min    Activity Tolerance Patient tolerated treatment well           Past Medical History:  Diagnosis Date  . Bipolar 1 disorder ([place])   . Bowel incontinence   . Depression   . Diabetes mellitus without complication ([place])   . Mental disability     Past Surgical History:  Procedure Laterality Date  . FOOT SURGERY    . KNEE ARTHROSCOPY W/ ACL RECONSTRUCTION Left 10/12/2020    There were no vitals filed for this visit.   Subjective Assessment - 01/09/21 1555    Subjective "Being sore today due to being in the car yesterday."    Currently in Pain? Yes    Pain Score 4     Pain Location Knee    Pain Descriptors / Indicators Tingling;Sharp    Aggravating Factors  Prolong sitting    Pain Relieving Factors walking                             [facility] Adult PT Treatment/Exercise - 01/09/21 0001      Knee/Hip Exercises: Stretches   Passive Hamstring Stretch 3 reps;Left;30 seconds    Gastroc Stretch Left;30 seconds;3 reps   slant board     Knee/Hip Exercises: Aerobic   Recumbent Bike L5 x 5 min      Knee/Hip Exercises: Machines for Strengthening   Cybex Leg Press 2 x 15 60#, 2 x 15 60# con bil/ ecc LLE      Knee/Hip Exercises: Standing   Step Down Left;2 sets;Step Height: 8";Step Height: 6";15 reps   started with a 8 inch step regressed 6 inch step              Balance Exercises - 01/09/21 0001      Balance Exercises:  Standing   Standing Eyes Opened Solid surface    SLS Eyes open;10 secs;5 reps;Solid surface   in // intermittent UE (1 finger touch on //)              PT Short Term Goals - 12/20/20 1441      PT SHORT TERM GOAL #1   Title pt to be IND with initial HEP    Status Achieved      PT SHORT TERM GOAL #2   Title increase L knee AAROM flexion to >/= 80 degrees for therapeutic progression    Baseline see flowsheet    Status Achieved      PT SHORT TERM GOAL #3   Title pt to ambulate utizing step through pattern with LRAD with </= 7/10 max for safety and improvement with gait efficiency    Baseline 12/20/20 - continues to ambulate with bariatric axillary crutch, no pain    Status Achieved      PT SHORT TERM GOAL #4   Title incrase FOTO score by >/= 6 points  to promote improvement in function    Baseline goal met on 11/09/20 - 62% from 55%    Status Achieved             PT Long Term Goals - 12/20/20 1457      PT LONG TERM GOAL #1   Title increase knee total arc ROM to >/= 5 - 108 with </= 3/10 pain max for function ROM required for ADLs    Baseline see flowsheet    Time 8    Period Weeks    Status On-going    Target Date 01/31/21      PT LONG TERM GOAL #2   Title pt to be able to sit, stand and walk for >/= 45 min with LRAD for inhome and short community distances    Baseline ongoing    Time 8    Period Weeks    Status On-going      PT LONG TERM GOAL #3   Title increase LLE gross strength to >/= 4/5 to promote knee/ hip stability to maximize safety    Time 8    Period Weeks    Status On-going      PT LONG TERM GOAL #4   Title increase FOTO score to >/= 64% to demo improvement in function    Baseline 12/20/20 - 62%    Time 8    Period Weeks    Status On-going      PT LONG TERM GOAL #5   Title pt to be IND with all HEP given and is able to maintain and progress current LOF    Time 8    Period Weeks    Status On-going                 Plan - 01/09/21 1650     Clinical Impression Statement Pt arrived to therapy with reports of stiffness in the knee due to being in the car yesterday for a prolong peroid of time.  The patient did ambulate initially with bent left knee gait pattern.  Therapy focused on quadriceps strengthening and stretches to improve gait pattern.  Naraly did have compensation today with 8 inch step ups with a hop up motion.  The step was reduced to 6 inches today.  The pt did present with an increase of left knee extension motion during ambulation after HS/gastroc stretches.  Progressed therapy today with balance training.  Parice did require use of one finger tip during single leg balance to prevent loss of balance.  The patient would continue to benefit from further strengthening, gait training, balance training, and stairs for ADLs.    PT Frequency 1x / week    PT Treatment/Interventions ADLs/Self Care Home Management;Cryotherapy;Electrical Stimulation;Iontophoresis 62m/ml Dexamethasone;Moist Heat;Ultrasound;Gait training;Stair training;Functional mobility training;Therapeutic activities;Therapeutic exercise;Balance training;Neuromuscular re-education;Patient/family education;Manual techniques;Passive range of motion;Taping;Vasopneumatic Device    PT Next Visit Plan Continue with CKC strengthening, stairs/steps, gait normalization, and balance training.           Patient will benefit from skilled therapeutic intervention in order to improve the following deficits and impairments:  Improper body mechanics,Increased muscle spasms,Pain,Decreased activity tolerance,Postural dysfunction,Difficulty walking,Abnormal gait,Decreased strength,Decreased endurance,Increased edema  Visit Diagnosis: Chronic pain of left knee  Stiffness of left knee, not elsewhere classified     Problem List Patient Active Problem List   Diagnosis Date Noted  . Pain in left elbow 07/27/2020  . Prediabetes 02/03/2019  . Obstructive sleep apnea 04/23/2018  .  Other fatigue 11/26/2017  . Shortness  of breath on exertion 11/26/2017  . Type 2 diabetes mellitus without complication, without long-term current use of insulin ([place]) 11/26/2017  . Other hyperlipidemia 11/26/2017  . Vitamin D deficiency 11/26/2017  . Schizoaffective disorder, bipolar type ([place]) 09/14/2014  . Intellectual disability 09/10/2014    Rich Number, PT, DPT, OCS   [name] PT, DPT, LAT, ATC  01/09/21  5:09 PM      [facility] [address] [place], [place], [address] Phone: [phone]   Fax:  [phone]  Name: GLADY OUDERKIRK MRN: [id number] Date of Birth: [birth year]

## 2021-01-19 ENCOUNTER — Other Ambulatory Visit: Payer: Self-pay

## 2021-01-19 ENCOUNTER — Ambulatory Visit: Payer: Medicare Other | Attending: Orthopedic Surgery

## 2021-01-19 DIAGNOSIS — M25562 Pain in left knee: Secondary | ICD-10-CM | POA: Diagnosis not present

## 2021-01-19 DIAGNOSIS — M25662 Stiffness of left knee, not elsewhere classified: Secondary | ICD-10-CM | POA: Diagnosis not present

## 2021-01-19 DIAGNOSIS — R600 Localized edema: Secondary | ICD-10-CM | POA: Diagnosis not present

## 2021-01-19 DIAGNOSIS — R2689 Other abnormalities of gait and mobility: Secondary | ICD-10-CM | POA: Diagnosis not present

## 2021-01-19 DIAGNOSIS — G8929 Other chronic pain: Secondary | ICD-10-CM

## 2021-01-19 NOTE — Therapy (Signed)
[place], [place], [address] Phone: [phone]   Fax:  [phone]  Physical Therapy Treatment  Patient Details  Name: Cindy Phillips MRN: [id number] Date of Birth: [birth year] Referring Provider (PT): Earlie Server, MD   Encounter Date: 01/19/2021   PT End of Session - 01/19/21 1358    Visit Number 14    Number of Visits 17    Date for PT Re-Evaluation 02/03/21    Authorization Type MCR: Kx mod 15th visit,    Progress Note Due on Visit 20    PT Start Time 1400    PT Stop Time 1442    PT Time Calculation (min) 42 min    Activity Tolerance Patient tolerated treatment well    Behavior During Therapy [facility] for tasks assessed/performed           Past Medical History:  Diagnosis Date  . Bipolar 1 disorder ([place])   . Bowel incontinence   . Depression   . Diabetes mellitus without complication ([place])   . Mental disability     Past Surgical History:  Procedure Laterality Date  . FOOT SURGERY    . KNEE ARTHROSCOPY W/ ACL RECONSTRUCTION Left 10/12/2020    There were no vitals filed for this visit.   Subjective Assessment - 01/19/21 1358    Subjective Pt presents to PT with no reports of pain, but does promote stiffness. She has been compliant with her HEP per reports. Pt is ready to begin PT treatment at this time.    Currently in Pain? Yes    Pain Score 2     Pain Location Knee    Pain Orientation Right                             [facility] Adult PT Treatment/Exercise - 01/19/21 0001      Knee/Hip Exercises: Aerobic   Recumbent Bike lvl 5 x 5 min while taking subjective      Knee/Hip Exercises: Machines for Strengthening   Cybex Knee Flexion 2x10 55lbs    Cybex Leg Press 2 x 15 60#, 2 x 15 60# con bil/ ecc LLE - 70lbs on second set    Hip Cybex flex/abd L LE 2x10 ea 37.5lbs      Knee/Hip Exercises: Standing   Wall Squat 10 reps    Wall Squat Limitations with physioball       Knee/Hip Exercises: Seated   Sit to Sand 2 sets;10 reps   holding 10lb KB - tap to table                   PT Short Term Goals - 12/20/20 1441      PT SHORT TERM GOAL #1   Title pt to be IND with initial HEP    Status Achieved      PT SHORT TERM GOAL #2   Title increase L knee AAROM flexion to >/= 80 degrees for therapeutic progression    Baseline see flowsheet    Status Achieved      PT SHORT TERM GOAL #3   Title pt to ambulate utizing step through pattern with LRAD with </= 7/10 max for safety and improvement with gait efficiency    Baseline 12/20/20 - continues to ambulate with bariatric axillary crutch, no pain    Status Achieved      PT SHORT TERM GOAL #4   Title incrase FOTO score by >/=  6 points to promote improvement in function    Baseline goal met on 11/09/20 - 62% from 55%    Status Achieved             PT Long Term Goals - 12/20/20 1457      PT LONG TERM GOAL #1   Title increase knee total arc ROM to >/= 5 - 108 with </= 3/10 pain max for function ROM required for ADLs    Baseline see flowsheet    Time 8    Period Weeks    Status On-going    Target Date 01/31/21      PT LONG TERM GOAL #2   Title pt to be able to sit, stand and walk for >/= 45 min with LRAD for inhome and short community distances    Baseline ongoing    Time 8    Period Weeks    Status On-going      PT LONG TERM GOAL #3   Title increase LLE gross strength to >/= 4/5 to promote knee/ hip stability to maximize safety    Time 8    Period Weeks    Status On-going      PT LONG TERM GOAL #4   Title increase FOTO score to >/= 64% to demo improvement in function    Baseline 12/20/20 - 62%    Time 8    Period Weeks    Status On-going      PT LONG TERM GOAL #5   Title pt to be IND with all HEP given and is able to maintain and progress current LOF    Time 8    Period Weeks    Status On-going                 Plan - 01/19/21 1433    Clinical Impression Statement  Pt was able to complete prescribed exercises with no adverse effect or increase in L knee pain. She continues to show improving L LE strength, but does continue to struggle with loaded closed chain knee flexion exercises. Pt is progressing well with therapy and should continue to be seen per POC as prescribed.    PT Frequency 1x / week    PT Treatment/Interventions ADLs/Self Care Home Management;Cryotherapy;Electrical Stimulation;Iontophoresis 36m/ml Dexamethasone;Moist Heat;Ultrasound;Gait training;Stair training;Functional mobility training;Therapeutic activities;Therapeutic exercise;Balance training;Neuromuscular re-education;Patient/family education;Manual techniques;Passive range of motion;Taping;Vasopneumatic Device    PT Next Visit Plan Continue with CKC strengthening, stairs/steps, gait normalization, and balance training.    PT [place]          Patient will benefit from skilled therapeutic intervention in order to improve the following deficits and impairments:  Improper body mechanics,Increased muscle spasms,Pain,Decreased activity tolerance,Postural dysfunction,Difficulty walking,Abnormal gait,Decreased strength,Decreased endurance,Increased edema  Visit Diagnosis: Stiffness of left knee, not elsewhere classified  Chronic pain of left knee  Localized edema  Other abnormalities of gait and mobility     Problem List Patient Active Problem List   Diagnosis Date Noted  . Pain in left elbow 07/27/2020  . Prediabetes 02/03/2019  . Obstructive sleep apnea 04/23/2018  . Other fatigue 11/26/2017  . Shortness of breath on exertion 11/26/2017  . Type 2 diabetes mellitus without complication, without long-term current use of insulin ([place] 11/26/2017  . Other hyperlipidemia 11/26/2017  . Vitamin D deficiency 11/26/2017  . Schizoaffective disorder, bipolar type ([place] 09/14/2014  . Intellectual disability 09/10/2014    DWard Chatters PT, DPT 01/19/21 2:48  PM  [facility]  [place], [place], [address] Phone: [phone]   Fax:  [phone]  Name: Cindy Phillips MRN: [id number] Date of Birth: 1990-10-24

## 2021-01-23 DIAGNOSIS — S83282D Other tear of lateral meniscus, current injury, left knee, subsequent encounter: Secondary | ICD-10-CM | POA: Diagnosis not present

## 2021-01-25 ENCOUNTER — Ambulatory Visit: Payer: Medicare Other

## 2021-01-25 ENCOUNTER — Other Ambulatory Visit: Payer: Self-pay

## 2021-01-25 DIAGNOSIS — R2689 Other abnormalities of gait and mobility: Secondary | ICD-10-CM

## 2021-01-25 DIAGNOSIS — R600 Localized edema: Secondary | ICD-10-CM

## 2021-01-25 DIAGNOSIS — G8929 Other chronic pain: Secondary | ICD-10-CM

## 2021-01-25 DIAGNOSIS — M25662 Stiffness of left knee, not elsewhere classified: Secondary | ICD-10-CM | POA: Diagnosis not present

## 2021-01-25 DIAGNOSIS — M25562 Pain in left knee: Secondary | ICD-10-CM | POA: Diagnosis not present

## 2021-01-25 NOTE — Therapy (Signed)
[place], [place], [address] Phone: [phone]   Fax:  [phone]  Physical Therapy Treatment  Patient Details  Name: Cindy Phillips MRN: [id number] Date of Birth: [birth year] Referring Provider (PT): Earlie Server, MD   Encounter Date: 01/25/2021   PT End of Session - 01/25/21 1702    Visit Number 15    Number of Visits 17    Date for PT Re-Evaluation 02/03/21    Authorization Type MCR: Kx mod 15th visit,    Progress Note Due on Visit 20    PT Start Time 1700    PT Stop Time 1739    PT Time Calculation (min) 39 min    Activity Tolerance Patient tolerated treatment well    Behavior During Therapy [facility] for tasks assessed/performed           Past Medical History:  Diagnosis Date  . Bipolar 1 disorder ([facility])   . Bowel incontinence   . Depression   . Diabetes mellitus without complication ([place])   . Mental disability     Past Surgical History:  Procedure Laterality Date  . FOOT SURGERY    . KNEE ARTHROSCOPY W/ ACL RECONSTRUCTION Left 10/12/2020    There were no vitals filed for this visit.   Subjective Assessment - 01/25/21 1702    Subjective Pt presents to PT with no reports of pain in L knee. Does note continued stiffness. She has been compliant with her HEP with no adverse effect. Pt has new referral for continued PT work after MD visit the previous day. Pt is ready to begin PT treatment at this time.    Currently in Pain? No/denies    Pain Score 0-No pain                             [facility] Adult PT Treatment/Exercise - 01/25/21 0001      Knee/Hip Exercises: Aerobic   Recumbent Bike lvl 5 x 5 min while taking subjective      Knee/Hip Exercises: Machines for Strengthening   Cybex Knee Extension 2x10 35lbs    Cybex Knee Flexion 2x10 55lbs    Cybex Leg Press x10 L LE only 20lbs    Hip Cybex flex L LE 2x10 ea 37.5lbs      Knee/Hip Exercises: Standing   Functional  Squat 2 sets;10 reps    Functional Squat Limitations holding free motion handle      Knee/Hip Exercises: Seated   Sit to Sand 2 sets;10 reps   holding 10lb KB                   PT Short Term Goals - 12/20/20 1441      PT SHORT TERM GOAL #1   Title pt to be IND with initial HEP    Status Achieved      PT SHORT TERM GOAL #2   Title increase L knee AAROM flexion to >/= 80 degrees for therapeutic progression    Baseline see flowsheet    Status Achieved      PT SHORT TERM GOAL #3   Title pt to ambulate utizing step through pattern with LRAD with </= 7/10 max for safety and improvement with gait efficiency    Baseline 12/20/20 - continues to ambulate with bariatric axillary crutch, no pain    Status Achieved      PT SHORT TERM GOAL #4   Title incrase FOTO  score by >/= 6 points to promote improvement in function    Baseline goal met on 11/09/20 - 62% from 55%    Status Achieved             PT Long Term Goals - 12/20/20 1457      PT LONG TERM GOAL #1   Title increase knee total arc ROM to >/= 5 - 108 with </= 3/10 pain max for function ROM required for ADLs    Baseline see flowsheet    Time 8    Period Weeks    Status On-going    Target Date 01/31/21      PT LONG TERM GOAL #2   Title pt to be able to sit, stand and walk for >/= 45 min with LRAD for inhome and short community distances    Baseline ongoing    Time 8    Period Weeks    Status On-going      PT LONG TERM GOAL #3   Title increase LLE gross strength to >/= 4/5 to promote knee/ hip stability to maximize safety    Time 8    Period Weeks    Status On-going      PT LONG TERM GOAL #4   Title increase FOTO score to >/= 64% to demo improvement in function    Baseline 12/20/20 - 62%    Time 8    Period Weeks    Status On-going      PT LONG TERM GOAL #5   Title pt to be IND with all HEP given and is able to maintain and progress current LOF    Time 8    Period Weeks    Status On-going                  Plan - 01/25/21 1740    Clinical Impression Statement Pt was able to complete prescribed exercises but noted slight discomfort in bilateral knees, R>L post session. She continues to use R LE to compensate for lack of L quad strength. Exercises again changed to perform with L LE only to better isolate L quad. Pt continues to benefit from skilled PT services working on improving LE strength and stability post ACL reconstruction. Will continue to progress exercises as able.    PT Frequency 1x / week    PT Treatment/Interventions ADLs/Self Care Home Management;Cryotherapy;Electrical Stimulation;Iontophoresis 6m/ml Dexamethasone;Moist Heat;Ultrasound;Gait training;Stair training;Functional mobility training;Therapeutic activities;Therapeutic exercise;Balance training;Neuromuscular re-education;Patient/family education;Manual techniques;Passive range of motion;Taping;Vasopneumatic Device    PT Next Visit Plan Continue with CKC strengthening, stairs/steps, gait normalization, and balance training.    PT [place]          Patient will benefit from skilled therapeutic intervention in order to improve the following deficits and impairments:  Improper body mechanics,Increased muscle spasms,Pain,Decreased activity tolerance,Postural dysfunction,Difficulty walking,Abnormal gait,Decreased strength,Decreased endurance,Increased edema  Visit Diagnosis: Chronic pain of left knee  Stiffness of left knee, not elsewhere classified  Localized edema  Other abnormalities of gait and mobility     Problem List Patient Active Problem List   Diagnosis Date Noted  . Pain in left elbow 07/27/2020  . Prediabetes 02/03/2019  . Obstructive sleep apnea 04/23/2018  . Other fatigue 11/26/2017  . Shortness of breath on exertion 11/26/2017  . Type 2 diabetes mellitus without complication, without long-term current use of insulin ([place] 11/26/2017  . Other hyperlipidemia 11/26/2017   . Vitamin D deficiency 11/26/2017  . Schizoaffective disorder, bipolar type ([place] 09/14/2014  .  Intellectual disability 09/10/2014    Ward Chatters, PT, DPT 01/25/21 5:43 PM  [facility] [address] [place], [place], [address] Phone: [phone]   Fax:  [phone]  Name: Cindy Phillips MRN: [id number] Date of Birth: 09-30-1990

## 2021-01-31 DIAGNOSIS — E1169 Type 2 diabetes mellitus with other specified complication: Secondary | ICD-10-CM | POA: Diagnosis not present

## 2021-02-01 ENCOUNTER — Ambulatory Visit: Payer: Medicare Other | Admitting: Physical Therapy

## 2021-02-01 ENCOUNTER — Encounter: Payer: Self-pay | Admitting: Physical Therapy

## 2021-02-01 ENCOUNTER — Other Ambulatory Visit: Payer: Self-pay

## 2021-02-01 DIAGNOSIS — M25662 Stiffness of left knee, not elsewhere classified: Secondary | ICD-10-CM | POA: Diagnosis not present

## 2021-02-01 DIAGNOSIS — R2689 Other abnormalities of gait and mobility: Secondary | ICD-10-CM | POA: Diagnosis not present

## 2021-02-01 DIAGNOSIS — R600 Localized edema: Secondary | ICD-10-CM | POA: Diagnosis not present

## 2021-02-01 DIAGNOSIS — M25562 Pain in left knee: Secondary | ICD-10-CM | POA: Diagnosis not present

## 2021-02-01 DIAGNOSIS — G8929 Other chronic pain: Secondary | ICD-10-CM | POA: Diagnosis not present

## 2021-02-01 NOTE — Therapy (Signed)
[place], [place], [address] Phone: [phone]   Fax:  [phone]  Physical Therapy Treatment / Re-certification  Patient Details  Name: Cindy Phillips MRN: [id number] Date of Birth: 02-Apr-1991 Referring Provider (PT): Earlie Server, MD   Encounter Date: 02/01/2021   PT End of Session - 02/01/21 1542     Visit Number 16    Number of Visits 20    Date for PT Re-Evaluation 03/15/21    Authorization Type MCR: Kx mod 15th visit,    Progress Note Due on Visit 52    PT Start Time 1543    Activity Tolerance Patient tolerated treatment well    Behavior During Therapy [facility] for tasks assessed/performed             Past Medical History:  Diagnosis Date   Bipolar 1 disorder ([place])    Bowel incontinence    Depression    Diabetes mellitus without complication ([place])    Mental disability     Past Surgical History:  Procedure Laterality Date   FOOT SURGERY     KNEE ARTHROSCOPY W/ ACL RECONSTRUCTION Left 10/12/2020    There were no vitals filed for this visit.   Subjective Assessment - 02/01/21 1548     Subjective "no pain or issues."    Currently in Pain? No/denies    Aggravating Factors  walking long distances.    Pain Relieving Factors sitting and resting                [facility] PT Assessment - 02/01/21 0001       Assessment   Medical Diagnosis L ACL reconstruction    Referring Provider (PT) Earlie Server, MD      Observation/Other Assessments   Focus on Therapeutic Outcomes (FOTO)  57%      AROM   Left Knee Extension 6    Left Knee Flexion 116      Strength   Left Hip Flexion 4/5    Left Hip Extension 4/5    Left Knee Flexion 4/5    Left Knee Extension 4/5                           [facility] Adult PT Treatment/Exercise - 02/01/21 0001       Knee/Hip Exercises: Aerobic   Recumbent Bike Lv 6 x 5 min      Knee/Hip Exercises: Machines for Strengthening    Cybex Knee Extension 3 x 15 15# LLE    Cybex Knee Flexion 3 x 15 25# LLE only    Cybex Leg Press 3 x 15 40# LLE only      Knee/Hip Exercises: Standing   Step Down 2 sets;10 reps;Step Height: 6"                    PT Education - 02/01/21 1558     Education Details reviewed HEP and discussed POC moving forward dropping to 1 x a week for the next 4 weeks. to stop using the brace    Person(s) Educated Patient    Methods Explanation;Verbal cues;Handout    Comprehension Verbalized understanding;Verbal cues required              PT Short Term Goals - 12/20/20 1441       PT SHORT TERM GOAL #1   Title pt to be IND with initial HEP    Status Achieved  PT SHORT TERM GOAL #2   Title increase L knee AAROM flexion to >/= 80 degrees for therapeutic progression    Baseline see flowsheet    Status Achieved      PT SHORT TERM GOAL #3   Title pt to ambulate utizing step through pattern with LRAD with </= 7/10 max for safety and improvement with gait efficiency    Baseline 12/20/20 - continues to ambulate with bariatric axillary crutch, no pain    Status Achieved      PT SHORT TERM GOAL #4   Title incrase FOTO score by >/= 6 points to promote improvement in function    Baseline goal met on 11/09/20 - 62% from 55%    Status Achieved               PT Long Term Goals - 02/01/21 1543       PT LONG TERM GOAL #1   Title increase knee total arc ROM to >/= 5 - 108 with </= 3/10 pain max for function ROM required for ADLs    Period Weeks    Status Achieved    Target Date 03/01/21      PT LONG TERM GOAL #2   Title pt to be able to sit, stand and walk for >/= 45 min with LRAD for inhome and short community distances    Status Achieved      PT LONG TERM GOAL #3   Title increase LLE gross strength to >/= 4/5 to promote knee/ hip stability to maximize safety    Period Weeks    Status On-going      PT LONG TERM GOAL #4   Title increase FOTO score to >/= 64% to demo  improvement in function    Period Weeks    Status On-going      PT LONG TERM GOAL #5   Title pt to be IND with all HEP given and is able to maintain and progress current LOF    Time 4    Period Weeks    Status On-going    Target Date 03/01/21                   Plan - 02/01/21 1616     Clinical Impression Statement pt reports she has no pain today, but does report pain/ difficulty with walking/ standing for long periods of time. She has increased her knee total arc ROM 5 - 116 degrees but does continue to demonstrate weakness in the hip/ knee. focused session primarily on strength and endurance with only LLE strengthening which she did well with bu fatigues quickly with. Plan to continue seeing patient 1 x a week for the next 4 weeks to work on functional strengtheing and endurance and maximize her function by addressing the deficits listed.    Rehab Potential Good    PT Frequency 1x / week    PT Duration 4 weeks    PT Treatment/Interventions ADLs/Self Care Home Management;Cryotherapy;Electrical Stimulation;Iontophoresis 84m/ml Dexamethasone;Moist Heat;Ultrasound;Gait training;Stair training;Functional mobility training;Therapeutic activities;Therapeutic exercise;Balance training;Neuromuscular re-education;Patient/family education;Manual techniques;Passive range of motion;Taping;Vasopneumatic Device    PT Next Visit Plan focus on quad/ hip strengthening work on endurnace.    Consulted and Agree with Plan of Care Patient             Patient will benefit from skilled therapeutic intervention in order to improve the following deficits and impairments:  Improper body mechanics, Increased muscle spasms, Pain, Decreased activity tolerance, Postural dysfunction, Difficulty walking, Abnormal  gait, Decreased strength, Decreased endurance, Increased edema  Visit Diagnosis: Chronic pain of left knee  Localized edema  Stiffness of left knee, not elsewhere classified  Other  abnormalities of gait and mobility     Problem List Patient Active Problem List   Diagnosis Date Noted   Pain in left elbow 07/27/2020   Prediabetes 02/03/2019   Obstructive sleep apnea 04/23/2018   Other fatigue 11/26/2017   Shortness of breath on exertion 11/26/2017   Type 2 diabetes mellitus without complication, without long-term current use of insulin ([place]) 11/26/2017   Other hyperlipidemia 11/26/2017   Vitamin D deficiency 11/26/2017   Schizoaffective disorder, bipolar type ([place]) 09/14/2014   Intellectual disability 09/10/2014   [name] PT, DPT, LAT, ATC  02/01/21  4:27 PM      [place] [facility] [address] [place], [place], [address] Phone: [phone]   Fax:  [phone]  Name: Cindy Phillips MRN: [id number] Date of Birth: 02/28/1991

## 2021-02-08 ENCOUNTER — Encounter: Payer: Medicare Other | Admitting: Physical Therapy

## 2021-02-08 ENCOUNTER — Ambulatory Visit: Payer: Medicare Other

## 2021-02-08 ENCOUNTER — Other Ambulatory Visit: Payer: Self-pay

## 2021-02-08 DIAGNOSIS — M25662 Stiffness of left knee, not elsewhere classified: Secondary | ICD-10-CM | POA: Diagnosis not present

## 2021-02-08 DIAGNOSIS — M25562 Pain in left knee: Secondary | ICD-10-CM

## 2021-02-08 DIAGNOSIS — G8929 Other chronic pain: Secondary | ICD-10-CM | POA: Diagnosis not present

## 2021-02-08 DIAGNOSIS — R600 Localized edema: Secondary | ICD-10-CM | POA: Diagnosis not present

## 2021-02-08 DIAGNOSIS — R2689 Other abnormalities of gait and mobility: Secondary | ICD-10-CM

## 2021-02-08 NOTE — Therapy (Signed)
[place], [place], [address] Phone: [phone]   Fax:  [phone]  Physical Therapy Treatment  Patient Details  Name: Cindy Phillips MRN: [id number] Date of Birth: 10-29-1990 Referring Provider (PT): Earlie Server, MD   Encounter Date: 02/08/2021   PT End of Session - 02/08/21 1530     Visit Number 17    Number of Visits 20    Date for PT Re-Evaluation 03/15/21    Authorization Type MCR: Kx mod 15th visit,    Progress Note Due on Visit 35    PT Start Time 1528    PT Stop Time 1610    PT Time Calculation (min) 42 min    Activity Tolerance Patient tolerated treatment well    Behavior During Therapy [facility] for tasks assessed/performed             Past Medical History:  Diagnosis Date   Bipolar 1 disorder ([place])    Bowel incontinence    Depression    Diabetes mellitus without complication ([place])    Mental disability     Past Surgical History:  Procedure Laterality Date   FOOT SURGERY     KNEE ARTHROSCOPY W/ ACL RECONSTRUCTION Left 10/12/2020    There were no vitals filed for this visit.   Subjective Assessment - 02/08/21 1530     Subjective Pt presents to PT with L knee stiffness per report. She has been compliant with her HEP. Pt is ready to begin PT treatment at this time.    Currently in Pain? No/denies    Pain Score 0-No pain                               [facility] Adult PT Treatment/Exercise - 02/08/21 0001       Knee/Hip Exercises: Aerobic   Recumbent Bike lvl 5 x 4 min while taking subjective      Knee/Hip Exercises: Machines for Strengthening   Cybex Knee Extension 3 x 15 15# LLE    Cybex Knee Flexion 3 x 15 25# LLE only    Cybex Leg Press 2 x 15 40# LLE only      Knee/Hip Exercises: Standing   Functional Squat Limitations x 15 with UE assist on //    Wall Squat Limitations x 10 - red physioball    Other Standing Knee Exercises repeated lunge to 8in  step x 10 ea                      PT Short Term Goals - 12/20/20 1441       PT SHORT TERM GOAL #1   Title pt to be IND with initial HEP    Status Achieved      PT SHORT TERM GOAL #2   Title increase L knee AAROM flexion to >/= 80 degrees for therapeutic progression    Baseline see flowsheet    Status Achieved      PT SHORT TERM GOAL #3   Title pt to ambulate utizing step through pattern with LRAD with </= 7/10 max for safety and improvement with gait efficiency    Baseline 12/20/20 - continues to ambulate with bariatric axillary crutch, no pain    Status Achieved      PT SHORT TERM GOAL #4   Title incrase FOTO score by >/= 6 points to promote improvement in function    Baseline goal met  on 11/09/20 - 62% from 55%    Status Achieved               PT Long Term Goals - 02/01/21 1543       PT LONG TERM GOAL #1   Title increase knee total arc ROM to >/= 5 - 108 with </= 3/10 pain max for function ROM required for ADLs    Period Weeks    Status Achieved    Target Date 03/01/21      PT LONG TERM GOAL #2   Title pt to be able to sit, stand and walk for >/= 45 min with LRAD for inhome and short community distances    Status Achieved      PT LONG TERM GOAL #3   Title increase LLE gross strength to >/= 4/5 to promote knee/ hip stability to maximize safety    Period Weeks    Status On-going      PT LONG TERM GOAL #4   Title increase FOTO score to >/= 64% to demo improvement in function    Period Weeks    Status On-going      PT LONG TERM GOAL #5   Title pt to be IND with all HEP given and is able to maintain and progress current LOF    Time 4    Period Weeks    Status On-going    Target Date 03/01/21                   Plan - 02/08/21 1614     Clinical Impression Statement Pt was once again able to complete prescribed exercises with no adverse effect. She continues to have L LE weakness, with frequent weight shift to R LE during squats.  Continued to isolate L LE for strengthening today to reduce compensatory motions. Will continue to progress as able per POC as prescribed.    PT Treatment/Interventions ADLs/Self Care Home Management;Cryotherapy;Electrical Stimulation;Iontophoresis 33m/ml Dexamethasone;Moist Heat;Ultrasound;Gait training;Stair training;Functional mobility training;Therapeutic activities;Therapeutic exercise;Balance training;Neuromuscular re-education;Patient/family education;Manual techniques;Passive range of motion;Taping;Vasopneumatic Device    PT Next Visit Plan focus on quad/ hip strengthening work on endurnace.    PT Home Exercise Plan [id number]             Patient will benefit from skilled therapeutic intervention in order to improve the following deficits and impairments:  Improper body mechanics, Increased muscle spasms, Pain, Decreased activity tolerance, Postural dysfunction, Difficulty walking, Abnormal gait, Decreased strength, Decreased endurance, Increased edema  Visit Diagnosis: Chronic pain of left knee  Localized edema  Stiffness of left knee, not elsewhere classified  Other abnormalities of gait and mobility     Problem List Patient Active Problem List   Diagnosis Date Noted   Pain in left elbow 07/27/2020   Prediabetes 02/03/2019   Obstructive sleep apnea 04/23/2018   Other fatigue 11/26/2017   Shortness of breath on exertion 11/26/2017   Type 2 diabetes mellitus without complication, without long-term current use of insulin ([place] 11/26/2017   Other hyperlipidemia 11/26/2017   Vitamin D deficiency 11/26/2017   Schizoaffective disorder, bipolar type ([place] 09/14/2014   Intellectual disability 09/10/2014    DWard Chatters PT, DPT 02/08/21 4:17 PM  [facility] [address]Phone: [phone]  Fax:  [phone] Name: Cindy SLIVAMRN: [id number] of Birth: 219-Mar-1992

## 2021-02-16 DIAGNOSIS — M791 Myalgia, unspecified site: Secondary | ICD-10-CM | POA: Diagnosis not present

## 2021-02-16 DIAGNOSIS — E785 Hyperlipidemia, unspecified: Secondary | ICD-10-CM | POA: Diagnosis not present

## 2021-02-16 DIAGNOSIS — M542 Cervicalgia: Secondary | ICD-10-CM | POA: Diagnosis not present

## 2021-02-16 DIAGNOSIS — E1165 Type 2 diabetes mellitus with hyperglycemia: Secondary | ICD-10-CM | POA: Diagnosis not present

## 2021-02-16 DIAGNOSIS — G43719 Chronic migraine without aura, intractable, without status migrainosus: Secondary | ICD-10-CM | POA: Diagnosis not present

## 2021-02-22 ENCOUNTER — Ambulatory Visit: Payer: Medicare Other | Attending: Orthopedic Surgery

## 2021-02-22 ENCOUNTER — Other Ambulatory Visit: Payer: Self-pay

## 2021-02-22 DIAGNOSIS — R600 Localized edema: Secondary | ICD-10-CM | POA: Diagnosis not present

## 2021-02-22 DIAGNOSIS — M25562 Pain in left knee: Secondary | ICD-10-CM | POA: Diagnosis not present

## 2021-02-22 DIAGNOSIS — G8929 Other chronic pain: Secondary | ICD-10-CM

## 2021-02-22 DIAGNOSIS — R2689 Other abnormalities of gait and mobility: Secondary | ICD-10-CM

## 2021-02-22 DIAGNOSIS — M25662 Stiffness of left knee, not elsewhere classified: Secondary | ICD-10-CM | POA: Diagnosis not present

## 2021-02-22 NOTE — Therapy (Signed)
[place], [place], [address] Phone: [phone]   Fax:  [phone]  Physical Therapy Treatment  Patient Details  Name: Cindy Phillips MRN: [id number] Date of Birth: 1991/08/05 Referring Provider (PT): Earlie Server, MD   Encounter Date: 02/22/2021   PT End of Session - 02/22/21 1225     Visit Number 18    Number of Visits 20    Date for PT Re-Evaluation 03/15/21    Authorization Type MCR: Kx mod 15th visit,    Progress Note Due on Visit 10    PT Start Time 1223   arrived late   PT Stop Time 1257    PT Time Calculation (min) 34 min    Activity Tolerance Patient tolerated treatment well    Behavior During Therapy [facility] for tasks assessed/performed             Past Medical History:  Diagnosis Date   Bipolar 1 disorder ([place])    Bowel incontinence    Depression    Diabetes mellitus without complication ([place])    Mental disability     Past Surgical History:  Procedure Laterality Date   FOOT SURGERY     KNEE ARTHROSCOPY W/ ACL RECONSTRUCTION Left 10/12/2020    There were no vitals filed for this visit.   Subjective Assessment - 02/22/21 1225     Subjective Pt presents to PT with no current L knee pain. [place]. Pt is ready to begin PT treatment at this time.    Currently in Pain? No/denies    Pain Score 0-No pain                               [facility] Adult PT Treatment/Exercise - 02/22/21 0001       Knee/Hip Exercises: Aerobic   Recumbent Bike lvl 5 x 3 min while taking subjective      Knee/Hip Exercises: Machines for Strengthening   Total Gym Leg Press 2x15 55lbs L LE      Knee/Hip Exercises: Standing   Wall Squat Limitations x 10 - red physioball    Other Standing Knee Exercises repeated lunge to 8in step x 10 ea; then to 6in step x 10 ea    Other Standing Knee Exercises lateral walk blue tband x 3 laps at counter       Knee/Hip Exercises: Seated   Sit to Sand 2 sets;10 reps   R LE in front to work L more; holding 15lb KB                     PT Short Term Goals - 12/20/20 1441       PT SHORT TERM GOAL #1   Title pt to be IND with initial HEP    Status Achieved      PT SHORT TERM GOAL #2   Title increase L knee AAROM flexion to >/= 80 degrees for therapeutic progression    Baseline see flowsheet    Status Achieved      PT SHORT TERM GOAL #3   Title pt to ambulate utizing step through pattern with LRAD with </= 7/10 max for safety and improvement with gait efficiency    Baseline 12/20/20 - continues to ambulate with bariatric axillary crutch, no pain    Status Achieved      PT SHORT TERM GOAL #4  Title incrase FOTO score by >/= 6 points to promote improvement in function    Baseline goal met on 11/09/20 - 62% from 55%    Status Achieved               PT Long Term Goals - 02/01/21 1543       PT LONG TERM GOAL #1   Title increase knee total arc ROM to >/= 5 - 108 with </= 3/10 pain max for function ROM required for ADLs    Period Weeks    Status Achieved    Target Date 03/01/21      PT LONG TERM GOAL #2   Title pt to be able to sit, stand and walk for >/= 45 min with LRAD for inhome and short community distances    Status Achieved      PT LONG TERM GOAL #3   Title increase LLE gross strength to >/= 4/5 to promote knee/ hip stability to maximize safety    Period Weeks    Status On-going      PT LONG TERM GOAL #4   Title increase FOTO score to >/= 64% to demo improvement in function    Period Weeks    Status On-going      PT LONG TERM GOAL #5   Title pt to be IND with all HEP given and is able to maintain and progress current LOF    Time 4    Period Weeks    Status On-going    Target Date 03/01/21                   Plan - 02/22/21 1256     Clinical Impression Statement Cindy Phillips continues to progress well with therapy, showing slow improvements in  L quad strength and ability. She continues to fatigue quickly with exercises and compensate with R LE. Will continue to isolate L LE in single leg strengthening activities. Will otherwise continue to progress as able.    PT Treatment/Interventions ADLs/Self Care Home Management;Cryotherapy;Electrical Stimulation;Iontophoresis 39m/ml Dexamethasone;Moist Heat;Ultrasound;Gait training;Stair training;Functional mobility training;Therapeutic activities;Therapeutic exercise;Balance training;Neuromuscular re-education;Patient/family education;Manual techniques;Passive range of motion;Taping;Vasopneumatic Device    PT Next Visit Plan focus on quad/ hip strengthening work on endurnace.    PT Home Exercise Plan [id number]             Patient will benefit from skilled therapeutic intervention in order to improve the following deficits and impairments:  Improper body mechanics, Increased muscle spasms, Pain, Decreased activity tolerance, Postural dysfunction, Difficulty walking, Abnormal gait, Decreased strength, Decreased endurance, Increased edema  Visit Diagnosis: Chronic pain of left knee  Localized edema  Other abnormalities of gait and mobility  Stiffness of left knee, not elsewhere classified     Problem List Patient Active Problem List   Diagnosis Date Noted   Pain in left elbow 07/27/2020   Prediabetes 02/03/2019   Obstructive sleep apnea 04/23/2018   Other fatigue 11/26/2017   Shortness of breath on exertion 11/26/2017   Type 2 diabetes mellitus without complication, without long-term current use of insulin ([place] 11/26/2017   Other hyperlipidemia 11/26/2017   Vitamin D deficiency 11/26/2017   Schizoaffective disorder, bipolar type ([place] 09/14/2014   Intellectual disability 09/10/2014    DWard Chatters PT, DPT 02/22/21 12:58 PM  [facility]GIrvington [place] [address]Phone: [phone]  Fax:   [phone] Name: Cindy SCHMELINGMRN: [id number] of Birth: [birth year]

## 2021-02-28 ENCOUNTER — Ambulatory Visit: Payer: Medicare Other

## 2021-02-28 ENCOUNTER — Other Ambulatory Visit: Payer: Self-pay

## 2021-02-28 DIAGNOSIS — G8929 Other chronic pain: Secondary | ICD-10-CM

## 2021-02-28 DIAGNOSIS — R600 Localized edema: Secondary | ICD-10-CM | POA: Diagnosis not present

## 2021-02-28 DIAGNOSIS — M25662 Stiffness of left knee, not elsewhere classified: Secondary | ICD-10-CM | POA: Diagnosis not present

## 2021-02-28 DIAGNOSIS — R2689 Other abnormalities of gait and mobility: Secondary | ICD-10-CM | POA: Diagnosis not present

## 2021-02-28 DIAGNOSIS — M25562 Pain in left knee: Secondary | ICD-10-CM | POA: Diagnosis not present

## 2021-02-28 NOTE — Therapy (Signed)
[place], [place], [address] Phone: [phone]   Fax:  [phone]  Physical Therapy Treatment  Patient Details  Name: Cindy Phillips MRN: [id number] Date of Birth: [birth year] Referring Provider (PT): Earlie Server, MD   Encounter Date: 02/28/2021   PT End of Session - 02/28/21 1518     Visit Number 19    Number of Visits 20    Date for PT Re-Evaluation 03/15/21    Authorization Type MCR: Kx mod 15th visit,    Progress Note Due on Visit 20    PT Start Time 1520    PT Stop Time 1600    PT Time Calculation (min) 40 min    Activity Tolerance Patient tolerated treatment well    Behavior During Therapy WFL for tasks assessed/performed             Past Medical History:  Diagnosis Date   Bipolar 1 disorder ([place])    Bowel incontinence    Depression    Diabetes mellitus without complication ([place])    Mental disability     Past Surgical History:  Procedure Laterality Date   FOOT SURGERY     KNEE ARTHROSCOPY W/ ACL RECONSTRUCTION Left 10/12/2020    There were no vitals filed for this visit.   Subjective Assessment - 02/28/21 1518     Subjective Pt presents to PT with no L knee pain. She has been working on her HEP with no adverse effect. Pt is ready to begin PT treatment at this time.    Currently in Pain? No/denies    Pain Score 0-No pain                               [facility] Adult PT Treatment/Exercise - 02/28/21 0001       Knee/Hip Exercises: Aerobic   Recumbent Bike lvl 5 x 5 min while taking subjective      Knee/Hip Exercises: Machines for Strengthening   Total Gym Leg Press 2x15 55lbs L LE    Hip Cybex 2x10 abd 25lbs      Knee/Hip Exercises: Standing   Wall Squat Limitations 2x15 holding freemotion    Other Standing Knee Exercises repeated lunge 6in step x 10 ea    Other Standing Knee Exercises attempted single leg mini squat with TRX on L LE; very  difficult      Knee/Hip Exercises: Seated   Sit to Sand 2 sets;15 reps   R LE in front to work L more; holding 15lb KB                     PT Short Term Goals - 12/20/20 1441       PT SHORT TERM GOAL #1   Title pt to be IND with initial HEP    Status Achieved      PT SHORT TERM GOAL #2   Title increase L knee AAROM flexion to >/= 80 degrees for therapeutic progression    Baseline see flowsheet    Status Achieved      PT SHORT TERM GOAL #3   Title pt to ambulate utizing step through pattern with LRAD with </= 7/10 max for safety and improvement with gait efficiency    Baseline 12/20/20 - continues to ambulate with bariatric axillary crutch, no pain    Status Achieved      PT SHORT TERM GOAL #4   Title  incrase FOTO score by >/= 6 points to promote improvement in function    Baseline goal met on 11/09/20 - 62% from 55%    Status Achieved               PT Long Term Goals - 02/01/21 1543       PT LONG TERM GOAL #1   Title increase knee total arc ROM to >/= 5 - 108 with </= 3/10 pain max for function ROM required for ADLs    Period Weeks    Status Achieved    Target Date 03/01/21      PT LONG TERM GOAL #2   Title pt to be able to sit, stand and walk for >/= 45 min with LRAD for inhome and short community distances    Status Achieved      PT LONG TERM GOAL #3   Title increase LLE gross strength to >/= 4/5 to promote knee/ hip stability to maximize safety    Period Weeks    Status On-going      PT LONG TERM GOAL #4   Title increase FOTO score to >/= 64% to demo improvement in function    Period Weeks    Status On-going      PT LONG TERM GOAL #5   Title pt to be IND with all HEP given and is able to maintain and progress current LOF    Time 4    Period Weeks    Status On-going    Target Date 03/01/21                   Plan - 02/28/21 1557     Clinical Impression Statement Lorrine continues to make steady improvement with therapy. Today's  session focused on continued increase in strength of L quad and bilateral proximal hips. She continues to benefit from skilled PT services and should continue to be seen per POC.    PT Treatment/Interventions ADLs/Self Care Home Management;Cryotherapy;Electrical Stimulation;Iontophoresis 46m/ml Dexamethasone;Moist Heat;Ultrasound;Gait training;Stair training;Functional mobility training;Therapeutic activities;Therapeutic exercise;Balance training;Neuromuscular re-education;Patient/family education;Manual techniques;Passive range of motion;Taping;Vasopneumatic Device    PT Next Visit Plan focus on quad/ hip strengthening work on endurnace.    PT Home Exercise Plan [email or web address]             Patient will benefit from skilled therapeutic intervention in order to improve the following deficits and impairments:  Improper body mechanics, Increased muscle spasms, Pain, Decreased activity tolerance, Postural dysfunction, Difficulty walking, Abnormal gait, Decreased strength, Decreased endurance, Increased edema  Visit Diagnosis: Chronic pain of left knee  Localized edema  Other abnormalities of gait and mobility  Stiffness of left knee, not elsewhere classified     Problem List Patient Active Problem List   Diagnosis Date Noted   Pain in left elbow 07/27/2020   Prediabetes 02/03/2019   Obstructive sleep apnea 04/23/2018   Other fatigue 11/26/2017   Shortness of breath on exertion 11/26/2017   Type 2 diabetes mellitus without complication, without long-term current use of insulin ([place] 11/26/2017   Other hyperlipidemia 11/26/2017   Vitamin D deficiency 11/26/2017   Schizoaffective disorder, bipolar type ([place] 09/14/2014   Intellectual disability 09/10/2014    DWard Chatters PT, DPT 02/28/21 4:06 PM  [facility] [address]Phone: [phone]  Fax:  [phone] Name: PUMA JERDEMRN:  [id number] of Birth: 202-Sep-1992

## 2021-03-07 ENCOUNTER — Encounter: Payer: Self-pay | Admitting: Physical Therapy

## 2021-03-07 ENCOUNTER — Other Ambulatory Visit: Payer: Self-pay

## 2021-03-07 ENCOUNTER — Ambulatory Visit: Payer: Medicare Other | Admitting: Physical Therapy

## 2021-03-07 DIAGNOSIS — R600 Localized edema: Secondary | ICD-10-CM

## 2021-03-07 DIAGNOSIS — G8929 Other chronic pain: Secondary | ICD-10-CM | POA: Diagnosis not present

## 2021-03-07 DIAGNOSIS — M25662 Stiffness of left knee, not elsewhere classified: Secondary | ICD-10-CM

## 2021-03-07 DIAGNOSIS — R2689 Other abnormalities of gait and mobility: Secondary | ICD-10-CM

## 2021-03-07 DIAGNOSIS — M25562 Pain in left knee: Secondary | ICD-10-CM

## 2021-03-07 NOTE — Patient Instructions (Signed)
Access Code: [email or web address] URL: [email or web address] Date: 03/07/2021 Prepared by: [name]  Exercises Supine Quad Set - 1 x daily - 7 x weekly - 2 sets - 10 reps - 5 hold Supine Heel Slide with Strap - 1 x daily - 7 x weekly - 10 reps - 2 sets - 5 seconds hold Supine Gluteal Sets - 1 x daily - 7 x weekly - 2 sets - 10 reps - 5 sec hold Seated Knee Flexion Extension AROM - 1 x daily - 7 x weekly - 2 sets - 10 reps - 5 seconds hold Seated Heel Toe Raises - 1 x daily - 7 x weekly - 2 sets - 10 reps Long Sitting Quad Set with Towel Roll Under Heel - 1 x daily - 7 x weekly - 3 sets - 10 reps Mini Squat with Counter Support - 1 x daily - 7 x weekly - 3 sets - 10 reps Standing Terminal Knee Extension at Wall with Ball - 1 x daily - 7 x weekly - 2 sets - 10 reps - 5 hold Marching with Resistance - 1 x daily - 7 x weekly - 2 sets - 10 reps Wall Squat with Swiss Ball - 1 x daily - 7 x weekly - 2 sets - 10 reps Standing Hip Abduction with Resistance at Ankles and Counter Support - 1 x daily - 7 x weekly - 2 sets - 10 reps Standing Hip Extension with Resistance at Ankles and Unilateral Counter Support - 1 x daily - 7 x weekly - 2 sets - 10 reps

## 2021-03-07 NOTE — Therapy (Signed)
[place], [place], [address] Phone: [phone]   Fax:  [phone]  Physical Therapy Treatment / discharge note Progress Note Reporting Period 12/30/2020 to 03/07/2021  See note below for Objective Data and Assessment of Progress/Goals.      Patient Details  Name: Cindy Phillips MRN: [id number] Date of Birth: 1990-08-26 Referring Provider (PT): Earlie Server, MD   Encounter Date: 03/07/2021   PT End of Session - 03/07/21 1039     Visit Number 20    Number of Visits 20    Date for PT Re-Evaluation 03/15/21    Authorization Type MCR: Kx mod 15th visit,    Progress Note Due on Visit 26    PT Start Time 1044    PT Stop Time 1125    PT Time Calculation (min) 41 min    Activity Tolerance Patient tolerated treatment well    Behavior During Therapy WFL for tasks assessed/performed             Past Medical History:  Diagnosis Date   Bipolar 1 disorder ([place])    Bowel incontinence    Depression    Diabetes mellitus without complication ([place])    Mental disability     Past Surgical History:  Procedure Laterality Date   FOOT SURGERY     KNEE ARTHROSCOPY W/ ACL RECONSTRUCTION Left 10/12/2020    There were no vitals filed for this visit.   Subjective Assessment - 03/07/21 1045     Subjective "I am doing pretty good, I feel like I am getting stronger. I had a couple issues last week, when I lay down it feels like some pins and needles and it made it sore and the it went away after about 10 min."    Currently in Pain? No/denies                [facility] PT Assessment - 03/07/21 0001       Assessment   Medical Diagnosis L ACL reconstruction    Referring Provider (PT) Earlie Server, MD      AROM   Left Knee Extension 5    Left Knee Flexion 119                           [facility] Adult PT Treatment/Exercise - 03/07/21 0001       Knee/Hip Exercises: Aerobic    Recumbent Bike Lvl 8 x 5 min      Knee/Hip Exercises: Machines for Strengthening   Cybex Knee Extension 2 x 15 20# LLE only    Cybex Knee Flexion 3 x 15 25# LLE only      Knee/Hip Exercises: Standing   Hip Flexion Stengthening;Both;1 set;15 reps   with RTB around foot fro resistance   Hip Abduction Stengthening;Both;2 sets;10 reps;Knee straight   with red theraband   Hip Extension 2 sets;10 reps;Knee straight;Both    Extension Limitations RTB      Knee/Hip Exercises: Seated   Sit to Sand 2 sets;10 reps   LLE only from elevated table     Knee/Hip Exercises: Supine   Straight Leg Raises Strengthening;2 sets;10 reps   5#                   PT Education - 03/07/21 1116     Education Details Reviewed HEP and updated today. reviewed HEP and updated HEP. discussed progression of strengtheing. reviewed FOTO assessment.  Person(s) Educated Patient    Methods Explanation;Verbal cues;Handout    Comprehension Verbalized understanding;Verbal cues required              PT Short Term Goals - 12/20/20 1441       PT SHORT TERM GOAL #1   Title pt to be IND with initial HEP    Status Achieved      PT SHORT TERM GOAL #2   Title increase L knee AAROM flexion to >/= 80 degrees for therapeutic progression    Baseline see flowsheet    Status Achieved      PT SHORT TERM GOAL #3   Title pt to ambulate utizing step through pattern with LRAD with </= 7/10 max for safety and improvement with gait efficiency    Baseline 12/20/20 - continues to ambulate with bariatric axillary crutch, no pain    Status Achieved      PT SHORT TERM GOAL #4   Title incrase FOTO score by >/= 6 points to promote improvement in function    Baseline goal met on 11/09/20 - 62% from 55%    Status Achieved               PT Long Term Goals - 03/07/21 1056       PT LONG TERM GOAL #1   Title increase knee total arc ROM to >/= 5 - 108 with </= 3/10 pain max for function ROM required for ADLs    Status  Achieved      PT LONG TERM GOAL #2   Title pt to be able to sit, stand and walk for >/= 45 min with LRAD for inhome and short community distances    Status Achieved      PT LONG TERM GOAL #3   Title increase LLE gross strength to >/= 4/5 to promote knee/ hip stability to maximize safety    Status Achieved      PT LONG TERM GOAL #4   Title increase FOTO score to >/= 64% to demo improvement in function    Status Achieved      PT LONG TERM GOAL #5   Title pt to be IND with all HEP given and is able to maintain and progress current LOF    Period Weeks    Status Achieved                   Plan - 03/07/21 1112     Clinical Impression Statement Joeann has made excellent progress with physical therapy increasing knee ROM and strength. She does report intermittent swelling / soreness in the knee. She performed all exercises well with no report of pain or limitations. She has met or partially met all goals today. She no long requires skilled physical therapy and is able to maintain and progress her current LOF IND and will be discharged from PT today.    PT Treatment/Interventions ADLs/Self Care Home Management;Cryotherapy;Electrical Stimulation;Iontophoresis 72m/ml Dexamethasone;Moist Heat;Ultrasound;Gait training;Stair training;Functional mobility training;Therapeutic activities;Therapeutic exercise;Balance training;Neuromuscular re-education;Patient/family education;Manual techniques;Passive range of motion;Taping;Vasopneumatic Device    PT Next Visit Plan focus on quad/ hip strengthening work on endurnace.    PT [place]            Patient will benefit from skilled therapeutic intervention in order to improve the following deficits and impairments:  Improper body mechanics, Increased muscle spasms, Pain, Decreased activity tolerance, Postural dysfunction, Difficulty walking, Abnormal gait, Decreased strength, Decreased endurance, Increased edema  Visit  Diagnosis:  Chronic pain of left knee  Other abnormalities of gait and mobility  Localized edema  Stiffness of left knee, not elsewhere classified     Problem List Patient Active Problem List   Diagnosis Date Noted   Pain in left elbow 07/27/2020   Prediabetes 02/03/2019   Obstructive sleep apnea 04/23/2018   Other fatigue 11/26/2017   Shortness of breath on exertion 11/26/2017   Type 2 diabetes mellitus without complication, without long-term current use of insulin ([place]) 11/26/2017   Other hyperlipidemia 11/26/2017   Vitamin D deficiency 11/26/2017   Schizoaffective disorder, bipolar type ([place]) 09/14/2014   Intellectual disability 09/10/2014    [name] 03/07/2021, 11:33 AM  [facility] [address] [place], [place], [address] Phone: [phone]   Fax:  [phone]  Name: WILSIE KERN MRN: [id number] Date of Birth: [birth year]      PHYSICAL THERAPY DISCHARGE SUMMARY  Visits from Start of Care: 20  Current functional level related to goals / functional outcomes: See goals FOTO 69%   Remaining deficits: See assessment   Education / Equipment: HEP, theraband, progression of strengthening    Patient agrees to discharge. Patient goals were met. Patient is being discharged due to being pleased with the current functional level.  Daizee Firmin PT, DPT, LAT, ATC  03/07/21  11:34 AM

## 2021-03-14 ENCOUNTER — Ambulatory Visit: Payer: Medicare Other | Admitting: Physical Therapy

## 2021-04-06 DIAGNOSIS — G43719 Chronic migraine without aura, intractable, without status migrainosus: Secondary | ICD-10-CM | POA: Diagnosis not present

## 2021-04-06 DIAGNOSIS — M542 Cervicalgia: Secondary | ICD-10-CM | POA: Diagnosis not present

## 2021-04-06 DIAGNOSIS — M791 Myalgia, unspecified site: Secondary | ICD-10-CM | POA: Diagnosis not present

## 2021-04-10 ENCOUNTER — Other Ambulatory Visit: Payer: Self-pay | Admitting: *Deleted

## 2021-04-10 NOTE — Patient Outreach (Signed)
[facility]) Care Management  [place]  04/10/2021   Cindy Phillips 08/01/1991 [id number]  RN Health Coach telephone call to patient.  [employer] compliance verified. Patient A1C is 5.8. Fasting blood sugar is 109. Per patient she is trying to manage portion control. Patient has completed her COVID vaccines. Patient has completed her rehab. Patient has not been exercising routinely after rehab. Patient has not had any recent falls. Per patient she is using her CPAP nightly. Patient has agreed to follow up outreach calls.   Encounter Medications:  Outpatient Encounter Medications as of 04/10/2021  Medication Sig   atorvastatin (LIPITOR) 10 MG tablet Take 10 mg by mouth daily.   baclofen (LIORESAL) 10 MG tablet Take 10 mg by mouth 3 (three) times daily.   diazepam (VALIUM) 5 MG tablet Take 1 tablet 30 minutes prior to MRI   hydrOXYzine (ATARAX/VISTARIL) 50 MG tablet Take 1 tablet (50 mg total) by mouth every 4 (four) hours as needed for anxiety.   lamoTRIgine (LAMICTAL) 25 MG tablet Take 1 tablet (25 mg total) by mouth daily. For mood stabilization   medroxyPROGESTERone (DEPO-PROVERA) 150 MG/ML injection Inject 1 mL (150 mg total) into the muscle every 3 (three) months. For prevention of pregnancy   metFORMIN (GLUCOPHAGE) 500 MG tablet Take one tablet by mouth in the morning and one tablet by mouth in the evening   OLANZapine zydis (ZYPREXA) 15 MG disintegrating tablet Take 1 tablet (15 mg total) by mouth at bedtime. For mood control   ondansetron (ZOFRAN ODT) 4 MG disintegrating tablet Take 1 tablet (4 mg total) by mouth every 8 (eight) hours as needed.   polyethylene glycol powder (GLYCOLAX/MIRALAX) powder Take 1 Container by mouth once.   sodium chloride (OCEAN) 0.65 % SOLN nasal spray Place 1 spray into both nostrils as needed for congestion.   Vitamin D, Ergocalciferol, (DRISDOL) 1.25 MG (50000 UNIT) CAPS capsule Take 1 capsule (50,000 Units total) by mouth every  3 (three) days.   zonisamide (ZONEGRAN) 25 MG capsule Take 25 mg by mouth daily.   No facility-administered encounter medications on file as of 04/10/2021.    Functional Status:  No flowsheet data found.  Fall/Depression Screening: Fall Risk  10/12/2016 07/27/2016 05/16/2016  Falls in the past year? No No No   PHQ 2/9 Scores 04/10/2021 01/21/2020 11/26/2017 10/12/2016 07/27/2016 05/16/2016  PHQ - 2 Score _0 0 1 1  PHQ- 9 Score _1 - - -  Exception Documentation - - Medical reason - - -    Assessment:   Care Plan Care Plan : Wellness (Adult)  Updates made by Jyasia Markoff, Eppie Gibson, RN since 04/10/2021 12:00 AM     Problem: Healthy Nutrition (Wellness)      Long-Range Goal: Healthy Nutrition Achieved   Start Date: 06/27/2020  Expected End Date: 08/18/2021  This Visit's Progress: Not on track  Note:   Evidence-based guidance:  Assess patient perspective on healthy weight, weight loss or weight gain, motivation and readiness for change.  Recommend or set healthy weight goal based on body mass index.  Review current dietary intake and exercise levels.  Encourage small steps toward making change to eating and exercising.  Provide individualized medical nutrition therapy.   Notes:  Patient is attending weight loss clinic Provided a list of healthy  low carb snacks     Task: Alleviate Barriers to Healthy Eating   Due Date: 08/18/2021  Note:   Care Management Activities:    -  incremental change encouraged - making healthy food choices encouraged - praise for success provided - signs of disordered eating behaviors monitored - strategies to removing barriers to physical activity or exercise promoted    Notes:  [id number] Per patient she is monitoring her portions    Care Plan : Diabetes Type 2 (Adult)  Updates made by Verlin Grills, RN since 04/10/2021 12:00 AM     Problem: Glycemic Management (Diabetes, Type 2)   Priority: Medium  Onset Date: 06/27/2020      Long-Range Goal: Glycemic Management Optimized   Start Date: 06/27/2020  Expected End Date: 08/18/2021  This Visit's Progress: On track  Recent Progress: On track  Priority: High  Note:   Evidence-based guidance:  Anticipate A1C testing (point-of-care) every 3 to 6 months based on goal attainment.  Review mutually-set A1C goal or target range.  Anticipate use of antihyperglycemic with or without insulin and periodic adjustments; consider active involvement of pharmacist.  Provide medical nutrition therapy and development of individualized eating.  Compare self-reported symptoms of hypo or hyperglycemia to blood glucose levels, diet and fluid intake, current medications, psychosocial and physiologic stressors, change in activity and barriers to care adherence.  Promote self-monitoring of blood glucose levels.  Assess and address barriers to management plan, such as food insecurity, age, developmental ability, depression, anxiety, fear of hypoglycemia or weight gain, as well as medication cost, side effects and complicated regimen.  Consider referral to community-based diabetes education program, visiting nurse, community health worker or health coach.  Encourage regular dental care for treatment of periodontal disease; refer to dental provider when needed.   Notes:     Problem: Disease Progression (Diabetes, Type 2)   Priority: Medium  Onset Date: 06/27/2020     Long-Range Goal: Disease Progression Prevented or Minimized   Start Date: 06/27/2020  Expected End Date: 08/18/2021  This Visit's Progress: On track  Recent Progress: On track  Priority: Medium  Note:   Evidence-based guidance:  Prepare patient for laboratory and diagnostic exams based on risk and presentation.  Encourage lifestyle changes, such as increased intake of plant-based foods, stress reduction, consistent physical activity and smoking cessation to prevent long-term complications and chronic disease.    Individualize activity and exercise recommendations while considering potential limitations, such as neuropathy, retinopathy or the ability to prevent hyperglycemia or hypoglycemia.   Prepare patient for use of pharmacologic therapy that may include antihypertensive, analgesic, prostaglandin E1 with periodic adjustments, based on presenting chronic condition and laboratory results.  Assess signs/symptoms and risk factors for hypertension, sleep-disordered breathing, neuropathy (including changes in gait and balance), retinopathy, nephropathy and sexual dysfunction.  Address pregnancy planning and contraceptive choice, especially when prescribing antihypertensive or statin.  Ensure completion of annual comprehensive foot exam and dilated eye exam.   Implement additional individualized goals and interventions based on identified risk factors.  Prepare patient for consultation or referral for specialist care, such as ophthalmology, neurology, cardiology, podiatry, nephrology or perinatology.   Notes:       Goals Addressed             This Visit's Progress    (THN)Eat Healthy   On track    Timeframe:  Long-Range Goal Priority:  Medium Start Date:      [id number]                       Expected End Date:  17616073  Follow Up Date  [id number]   - set goal weight - drink 6 to 8 glasses of water each day - fill half of plate with vegetables - limit fast food meals to no more than 1 per week - manage portion size - set a realistic goal    Why is this important?   When you are ready to manage your nutrition or weight, having a plan and setting goals will help.  Taking small steps to change how you eat and exercise is a good place to start.    Notes:  Patient is attending a weight loss clinic      [facility] and Keep All Appointments   On track    Timeframe:  Long-Range Goal Priority:  Medium Start Date:   26378588                          Expected End Date:   50277412                    Follow Up Date [id number]   - call to cancel if needed - keep a calendar with prescription refill dates - keep a calendar with appointment dates    Why is this important?   Part of staying healthy is seeing the doctor for follow-up care.  If you forget your appointments, there are some things you can do to stay on track.    Notes:  [id number]  Patient keeps all appoinments     (THN)Monitor and Manage My Blood Sugar   On track    Timeframe:  Long-Range Goal Priority:  High Start Date:    83662947                         Expected End Date:  65465035 Follow up outreach [id number]   - check blood sugar at prescribed times - check blood sugar if I feel it is too high or too low - take the blood sugar log to all doctor visits - take the blood sugar meter to all doctor visits    Why is this important?   Checking your blood sugar at home helps to keep it from getting very high or very low.  Writing the results in a diary or log helps the doctor know how to care for you.  Your blood sugar log should have the time, date and the results.  Also, write down the amount of insulin or other medicine that you take.  Other information, like what you ate, exercise done and how you were feeling, will also be helpful.     Notes:  At this time the patient is not checking her blood sugars.RN is encouraging her to monitor     [facility] Eye Exam   On track    Timeframe:  Long-Range Goal Priority:  Medium Start Date:      51700174                       Expected End Date:     94496759                 Follow Up Date 16384665   - keep appointment with eye doctor - schedule appointment with eye doctor    Why is this important?   Eye check-ups are important when you have diabetes.  Vision loss can be prevented.    Notes:  [id number] last eye exam  09/2020     (THN)Perform Foot Care   On track    Timeframe:  Long-Range Goal Priority:  Medium Start Date:  49201007                            Expected End Date:  12197588                    Follow Up Date 32549826   - check feet daily for cuts, sores or redness - trim toenails straight across - wash and dry feet carefully every day - wear comfortable, cotton socks - wear comfortable, well-fitting shoes    Why is this important?   Good foot care is very important when you have diabetes.  There are many things you can do to keep your feet healthy and catch a problem early.    Notes:  [id number] Last Podiatry [id number] Last Podiatry visit [id number]     (THN)Set My Target A1C   On track    Timeframe:  Long-Range Goal Priority:  Medium Start Date: 62863817                      Expected End Date:     71165790                 Follow Up Date 38333832   - set target A1C      Why is this important?   Your target A1C is decided together by you and your doctor.  It is based on several things like your age and other health issues.    Notes:  6.0 Patient has met her target A1C  Her A1C is currently 5.9 [id number] A1C 5.8        Plan:  Follow-up: Follow-up in 3 month(s) RN provided Guide for journey with diabetes booklet RN ordered free COVID testing kits RN discussed exercise routine RN sent update assessment to PCP  [place] [facility] [phone]

## 2021-04-10 NOTE — Patient Instructions (Signed)
Goals Addressed             This Visit's Progress    (THN)Eat Healthy   On track    Timeframe:  Long-Range Goal Priority:  Medium Start Date:      54650354                       Expected End Date:  65681275                    Follow Up Date  17001749   - set goal weight - drink 6 to 8 glasses of water each day - fill half of plate with vegetables - limit fast food meals to no more than 1 per week - manage portion size - set a realistic goal    Why is this important?   When you are ready to manage your nutrition or weight, having a plan and setting goals will help.  Taking small steps to change how you eat and exercise is a good place to start.    Notes:  Patient is attending a weight loss clinic     [facility] and Keep All Appointments   On track    Timeframe:  Long-Range Goal Priority:  Medium Start Date:   44967591                          Expected End Date:   63846659                   Follow Up Date 93570177   - call to cancel if needed - keep a calendar with prescription refill dates - keep a calendar with appointment dates    Why is this important?   Part of staying healthy is seeing the doctor for follow-up care.  If you forget your appointments, there are some things you can do to stay on track.    Notes:  93903009  Patient keeps all appoinments     (THN)Monitor and Manage My Blood Sugar   On track    Timeframe:  Long-Range Goal Priority:  High Start Date:    23300762                         Expected End Date:  26333545 Follow up outreach [id number]   - check blood sugar at prescribed times - check blood sugar if I feel it is too high or too low - take the blood sugar log to all doctor visits - take the blood sugar meter to all doctor visits    Why is this important?   Checking your blood sugar at home helps to keep it from getting very high or very low.  Writing the results in a diary or log helps the doctor know how to care for you.  Your blood  sugar log should have the time, date and the results.  Also, write down the amount of insulin or other medicine that you take.  Other information, like what you ate, exercise done and how you were feeling, will also be helpful.     Notes:  At this time the patient is not checking her blood sugars.RN is encouraging her to monitor     [facility] Eye Exam   On track    Timeframe:  Long-Range Goal Priority:  Medium Start Date:      73428768  Expected End Date:     27782423                 Follow Up Date 53614431   - keep appointment with eye doctor - schedule appointment with eye doctor    Why is this important?   Eye check-ups are important when you have diabetes.  Vision loss can be prevented.    Notes:  54008676 last eye exam 09/2020     (THN)Perform Foot Care   On track    Timeframe:  Long-Range Goal Priority:  Medium Start Date:  19509326                           Expected End Date:  71245809                    Follow Up Date 98338250   - check feet daily for cuts, sores or redness - trim toenails straight across - wash and dry feet carefully every day - wear comfortable, cotton socks - wear comfortable, well-fitting shoes    Why is this important?   Good foot care is very important when you have diabetes.  There are many things you can do to keep your feet healthy and catch a problem early.    Notes:  [id number] Last Podiatry [id number] Last Podiatry visit [id number]     (THN)Set My Target A1C   On track    Timeframe:  Long-Range Goal Priority:  Medium Start Date: 19622297                      Expected End Date:     98921194                 Follow Up Date 17408144   - set target A1C      Why is this important?   Your target A1C is decided together by you and your doctor.  It is based on several things like your age and other health issues.    Notes:  6.0 Patient has met her target A1C  Her A1C is currently 5.9 [id number] A1C  5.8

## 2021-04-19 DIAGNOSIS — E1165 Type 2 diabetes mellitus with hyperglycemia: Secondary | ICD-10-CM | POA: Diagnosis not present

## 2021-04-19 DIAGNOSIS — E785 Hyperlipidemia, unspecified: Secondary | ICD-10-CM | POA: Diagnosis not present

## 2021-04-25 DIAGNOSIS — S83282D Other tear of lateral meniscus, current injury, left knee, subsequent encounter: Secondary | ICD-10-CM | POA: Diagnosis not present

## 2021-05-08 DIAGNOSIS — Z025 Encounter for examination for participation in sport: Secondary | ICD-10-CM | POA: Diagnosis not present

## 2021-05-08 DIAGNOSIS — E1169 Type 2 diabetes mellitus with other specified complication: Secondary | ICD-10-CM | POA: Diagnosis not present

## 2021-05-16 DIAGNOSIS — M791 Myalgia, unspecified site: Secondary | ICD-10-CM | POA: Diagnosis not present

## 2021-05-16 DIAGNOSIS — M542 Cervicalgia: Secondary | ICD-10-CM | POA: Diagnosis not present

## 2021-05-16 DIAGNOSIS — G43719 Chronic migraine without aura, intractable, without status migrainosus: Secondary | ICD-10-CM | POA: Diagnosis not present

## 2021-05-17 ENCOUNTER — Ambulatory Visit (INDEPENDENT_AMBULATORY_CARE_PROVIDER_SITE_OTHER): Payer: Medicare Other | Admitting: Surgery

## 2021-05-17 ENCOUNTER — Encounter: Payer: Self-pay | Admitting: Surgery

## 2021-05-17 ENCOUNTER — Ambulatory Visit: Payer: Self-pay

## 2021-05-17 VITALS — BP 151/98 | HR 74 | Ht 71.0 in | Wt 306.0 lb

## 2021-05-17 DIAGNOSIS — S39012A Strain of muscle, fascia and tendon of lower back, initial encounter: Secondary | ICD-10-CM

## 2021-05-17 DIAGNOSIS — M545 Low back pain, unspecified: Secondary | ICD-10-CM | POA: Diagnosis not present

## 2021-05-17 NOTE — Progress Notes (Signed)
Office Visit Note   Patient: Cindy Phillips           Date of Birth: 1990-10-28           MRN: [id number] Visit Date: 05/17/2021              Requested by: Lucianne Lei, MD [address] [place],  [place] [address] PCP: Lucianne Lei, MD   Assessment & Plan: Visit Diagnoses:  1. Acute low back pain, unspecified back pain laterality, unspecified whether sciatica present   2. Strain of lumbar region, initial encounter     Plan: Since patient has not been using oral NSAID she will use Aleve that she has at home as directed.  Today in the clinic we gave a Toradol 30 mg IM injection.  We will schedule formal PT to evaluate and treat as needed.  Follow-up in 4 weeks for recheck.  We will decide at that time as to whether or not MRI is indicated.  Follow-Up Instructions: Return in about 4 weeks (around 06/14/2021) for With [facility] recheck back pain.   Orders:  Orders Placed This Encounter  Procedures   XR Lumbar Spine 2-3 Views   No orders of the defined types were placed in this encounter.     Procedures: No procedures performed   Clinical Data: No additional findings.   Subjective: Chief Complaint  Patient presents with   Lower Back - Pain    HPI 30 year old black female who is a new patient to clinic comes in today with complaints of right-sided low back pain.  States that a couple weeks ago she bent down and felt a sharp pain in the right low back.  No previous problems for onset.  She denies any lower extremity radicular symptoms.  She has been rubbing a medication on her back.  She has not used oral NSAIDs.  No complaints of bowel or bladder incontinence. Review of Systems No current cardiac pulmonary GI GU issues  Objective: Vital Signs: BP (!) 151/98   Pulse 74   Ht 5\' 11"  (1.803 m)   Wt (!) 306 lb (138.8 kg)   BMI 42.68 kg/m   Physical Exam Constitutional:      Appearance: She is obese.  HENT:     Head: Normocephalic.     Nose: Nose normal.  Eyes:      Extraocular Movements: Extraocular movements intact.  Pulmonary:     Effort: No respiratory distress.  Musculoskeletal:     Comments: Patient has right-sided low back discomfort when she stands up from the examining table.  Right-sided lumbar paraspinal tenderness/spasm.  Negative on left side.  Bilateral SI joints and sciatic notch nontender.  Negative logroll bilateral hips.  Negative straight leg raise.  No focal motor deficits.  Neurological:     Mental Status: She is alert and oriented to person, place, and time.  Psychiatric:        Mood and Affect: Mood normal.    Ortho Exam  Specialty Comments:  No specialty comments available.  Imaging: No results found.   PMFS History: Patient Active Problem List   Diagnosis Date Noted   Pain in left elbow 07/27/2020   Prediabetes 02/03/2019   Obstructive sleep apnea 04/23/2018   Other fatigue 11/26/2017   Shortness of breath on exertion 11/26/2017   Type 2 diabetes mellitus without complication, without long-term current use of insulin ([place]) 11/26/2017   Other hyperlipidemia 11/26/2017   Vitamin D deficiency 11/26/2017   Schizoaffective  disorder, bipolar type ([place]) 09/14/2014   Intellectual disability 09/10/2014   Past Medical History:  Diagnosis Date   Bipolar 1 disorder ([place])    Bowel incontinence    Depression    Diabetes mellitus without complication ([place])    Mental disability     Family History  Problem Relation Age of Onset   High blood pressure Mother    Anxiety disorder Mother    Diabetes Maternal Grandmother     Past Surgical History:  Procedure Laterality Date   FOOT SURGERY     KNEE ARTHROSCOPY W/ ACL RECONSTRUCTION Left 10/12/2020   Social History   Occupational History   Occupation: Clinical research associate     Comment: [employer]   Tobacco Use   Smoking status: Never   Smokeless tobacco: Never  Vaping Use   Vaping Use: Never used  Substance and Sexual Activity   Alcohol use: No   Drug use: No   Sexual  activity: Not Currently

## 2021-05-17 NOTE — Progress Notes (Signed)
Per Benjiman Core, PA, patient was given an IM injection of Toradol, 30mg  in the right gluteal.  Patient tolerated injection well.

## 2021-05-19 DIAGNOSIS — E785 Hyperlipidemia, unspecified: Secondary | ICD-10-CM | POA: Diagnosis not present

## 2021-05-19 DIAGNOSIS — E1165 Type 2 diabetes mellitus with hyperglycemia: Secondary | ICD-10-CM | POA: Diagnosis not present

## 2021-06-07 ENCOUNTER — Other Ambulatory Visit: Payer: Self-pay

## 2021-06-07 ENCOUNTER — Ambulatory Visit: Payer: Medicare Other | Attending: Orthopedic Surgery

## 2021-06-07 DIAGNOSIS — M6281 Muscle weakness (generalized): Secondary | ICD-10-CM | POA: Insufficient documentation

## 2021-06-07 DIAGNOSIS — G8929 Other chronic pain: Secondary | ICD-10-CM | POA: Insufficient documentation

## 2021-06-07 DIAGNOSIS — M545 Low back pain, unspecified: Secondary | ICD-10-CM | POA: Insufficient documentation

## 2021-06-07 NOTE — Therapy (Signed)
[place], [place], [address] Phone: [phone]   Fax:  [phone]  Physical Therapy Evaluation  Patient Details  Name: Cindy Phillips MRN: [id number] Date of Birth: 29-Apr-1991 Referring Provider (PT): Lucianne Lei, MD   Encounter Date: 06/07/2021   PT End of Session - 06/07/21 1605     Visit Number 1    Number of Visits 17    Date for PT Re-Evaluation 08/02/21    Authorization Type UHC MCR    PT Start Time 1528    PT Stop Time 1603    PT Time Calculation (min) 35 min    Activity Tolerance Patient tolerated treatment well    Behavior During Therapy WFL for tasks assessed/performed             Past Medical History:  Diagnosis Date   Bipolar 1 disorder ([place])    Bowel incontinence    Depression    Diabetes mellitus without complication ([place])    Mental disability     Past Surgical History:  Procedure Laterality Date   FOOT SURGERY     KNEE ARTHROSCOPY W/ ACL RECONSTRUCTION Left 10/12/2020    There were no vitals filed for this visit.    Subjective Assessment - 06/07/21 1524     Subjective Pt presents to PT with reports of R sided LBP and discomfort. She notes that pain has been slowly increasing over the last few weeks, with most pain at her visit with orthopedics on 9/28. She received an injection that seems to have helped to decrease her pain recently. She denies LE referral or paresthesias down R LE, also denies bowel/bladder changes or saddle anesthesia. Pt believes her altered walking mechanics from her L ACL repair have caused her lower back to start hurting over the last few months.    Pertinent History roughly 2 month hx of R sided LBP and discomfort    Limitations Sitting;Walking;Standing    How long can you sit comfortably? 5-10 min    How long can you stand comfortably? 10-15 min    How long can you walk comfortably? 5 min    Patient Stated Goals Pt would like to decrease R  sided LBP in order to pick up objects from the floor and improve overall comfort    Currently in Pain? Yes    Pain Score 5     Pain Location Back    Pain Orientation Right    Pain Descriptors / Indicators Sharp    Pain Type Acute pain    Pain Onset 1 to 4 weeks ago    Pain Frequency Intermittent    Aggravating Factors  prolonged sitting, laying in certain positions    Pain Relieving Factors medication           [facility] Adult PT Treatment/Exercise:   Therapeutic Exercise:  Bridge x 10 LTR x 10 Sit to stand x 10  Manual Therapy: Education and pt self tennis ball massage and trigger point release to R lumbar paraspinals/QL     [facility] PT Assessment - 06/07/21 0001       Assessment   Medical Diagnosis M54.50 (ICD-10-CM) - Acute low back pain, unspecified back pain laterality, unspecified whether sciatica present  S39.012A (ICD-10-CM) - Strain of lumbar region, initial encounter    Referring Provider (PT) Lucianne Lei, MD    Hand Dominance Right    Prior Therapy yes - OPPT for L knee ACL repair      Precautions  Precautions None      Restrictions   Weight Bearing Restrictions No      Balance Screen   Has the patient fallen in the past 6 months Yes    How many times? fall while trying to pick up blood glucose meter test needles from the floor; also fell while trying to get out of bed    Has the patient had a decrease in activity level because of a fear of falling?  No    Is the patient reluctant to leave their home because of a fear of falling?  No      Home Ecologist residence    Living Arrangements Parent    Available Help at Discharge Family    Type of [place] Access Level entry    Home Layout One level    [place]      Prior Function   Level of [place] with basic ADLs    Vocation Unemployed      Cognition   Overall Cognitive Status Within Functional Limits for tasks assessed       Observation/Other Assessments   Focus on Therapeutic Outcomes (FOTO)  50% function; 73% predicted      Functional Tests   Functional tests Sit to Stand      Strength   Right Hip Flexion 5/5    Right Hip ABduction 5/5    Right Hip ADduction 5/5    Left Hip Flexion 4/5    Left Hip ABduction 4/5    Left Hip ADduction 5/5    Right Knee Flexion 5/5    Right Knee Extension 5/5    Left Knee Flexion 5/5    Left Knee Extension 4/5      Palpation   Palpation comment TTP to R sided lumbar paraspinals and R QL      Ambulation/Gait   Gait Comments pt amb with antalgic gait towards L, decreaesd L knee extension                        Objective measurements completed on examination: See above findings.                PT Education - 06/07/21 1604     Education Details eval findings, HEP, FOTO, POC    Person(s) Educated Patient    Methods Explanation;Demonstration;Handout    Comprehension Verbalized understanding;Returned demonstration              PT Short Term Goals - 06/07/21 1607       PT SHORT TERM GOAL #1   Title Pt will be compliant and knowledgeable with initial HEP for improved carryover    Baseline initial HEP given    Time 3    Period Weeks    Status New    Target Date 06/28/21               PT Long Term Goals - 06/07/21 1607       PT LONG TERM GOAL #1   Title Pt will improve FOTO to no less than 73% function as proxy for functional improvement    Baseline 50% function    Time 8    Period Weeks    Status New    Target Date 08/02/21      PT LONG TERM GOAL #2   Title Pt will increase 30 Sec STS to no less than 10 reps for improved  comfort and functional mobility    Baseline 8 reps    Time 8    Period Weeks    Status New    Target Date 08/02/21      PT LONG TERM GOAL #3   Title Pt will self report LBP no greater than 3/10 at worst for improved comfort and function    Baseline 8/10 at worst    Time 8    Period Weeks     Status New    Target Date 08/02/21      PT LONG TERM GOAL #4   Title Pt will be knowledgeable with advanced HEP for continued improvement post d/c from Pt    Time 8    Period Weeks    Status New    Target Date 08/02/21                    Plan - 06/07/21 1612     Clinical Impression Statement Pt presents to PT with reports of roughly 2 month hx of R sided LBP and discomfort. Physical findings are consistent with referring provider impression, as pt demonstrates TTP to R sided lumbar paraspinals and QL along with decreased reps in 30 Sec STS indicated reduction in functional mobility. She responded well to initial interventions, noting decreased pain at end of evaluation session. Pt would benefit from skilled PT services working on improving hip and core strength along with improving lumbar ROM in order to reduce pain and improve function.    Personal Factors and Comorbidities Comorbidity 3+;Fitness    Comorbidities PMH: DM II; Bipolar Disorder; Mental disability; L ACL repair    Examination-Activity Limitations Stand;Locomotion Level;Stairs;Squat;Transfers    Examination-Participation Restrictions Psychologist, sport and exercise;Shop    Stability/Clinical Decision Making Evolving/Moderate complexity    Clinical Decision Making Moderate    Rehab Potential Good    PT Frequency 2x / week    PT Duration 8 weeks    PT Treatment/Interventions ADLs/Self Care Home Management;Cryotherapy;Electrical Stimulation;Iontophoresis 4mg /ml Dexamethasone;Moist Heat;Ultrasound;Gait training;Stair training;Functional mobility training;Therapeutic activities;Therapeutic exercise;Balance training;Neuromuscular re-education;Patient/family education;Manual techniques;Passive range of motion;Taping;Vasopneumatic Device;Dry needling    PT Next Visit Plan assess response to HEP; progress core and proximal hip strengthening and strengthening of R QL    PT Home Exercise Plan Access Code: [email or web address]    Consulted and  Agree with Plan of Care Patient             Patient will benefit from skilled therapeutic intervention in order to improve the following deficits and impairments:  Improper body mechanics, Increased muscle spasms, Pain, Decreased activity tolerance, Postural dysfunction, Difficulty walking, Abnormal gait, Decreased strength, Decreased endurance, Increased edema, Decreased balance  Visit Diagnosis: Chronic right-sided low back pain without sciatica  Muscle weakness (generalized)     Problem List Patient Active Problem List   Diagnosis Date Noted   Pain in left elbow 07/27/2020   Prediabetes 02/03/2019   Obstructive sleep apnea 04/23/2018   Other fatigue 11/26/2017   Shortness of breath on exertion 11/26/2017   Type 2 diabetes mellitus without complication, without long-term current use of insulin ([place]) 11/26/2017   Other hyperlipidemia 11/26/2017   Vitamin D deficiency 11/26/2017   Schizoaffective disorder, bipolar type ([place]) 09/14/2014   Intellectual disability 09/10/2014    Ward Chatters, PT 06/07/2021, 5:15 PM  [facility] [address] [place], [place], [address] Phone: [phone]   Fax:  [phone]  Name: SACHE SANE MRN: [id number] Date of Birth: 06-29-1991

## 2021-06-14 ENCOUNTER — Ambulatory Visit: Payer: Medicare Other | Admitting: Surgery

## 2021-06-19 DIAGNOSIS — E785 Hyperlipidemia, unspecified: Secondary | ICD-10-CM | POA: Diagnosis not present

## 2021-06-19 DIAGNOSIS — E1165 Type 2 diabetes mellitus with hyperglycemia: Secondary | ICD-10-CM | POA: Diagnosis not present

## 2021-06-20 ENCOUNTER — Ambulatory Visit: Payer: Medicare Other | Attending: Orthopedic Surgery

## 2021-06-20 ENCOUNTER — Other Ambulatory Visit: Payer: Self-pay

## 2021-06-20 DIAGNOSIS — G8929 Other chronic pain: Secondary | ICD-10-CM | POA: Diagnosis not present

## 2021-06-20 DIAGNOSIS — M6281 Muscle weakness (generalized): Secondary | ICD-10-CM | POA: Diagnosis not present

## 2021-06-20 DIAGNOSIS — M545 Low back pain, unspecified: Secondary | ICD-10-CM | POA: Diagnosis not present

## 2021-06-20 NOTE — Therapy (Signed)
[place], [place], [address] Phone: [phone]   Fax:  [phone]  Physical Therapy Treatment  Patient Details  Name: Cindy Phillips MRN: [id number] Date of Birth: May 17, 1991 Referring Provider (PT): Lucianne Lei, MD   Encounter Date: 06/20/2021   PT End of Session - 06/20/21 1609     Visit Number 2    Number of Visits 17    Date for PT Re-Evaluation 08/02/21    Authorization Type UHC MCR    PT Start Time 1612    PT Stop Time 1651    PT Time Calculation (min) 39 min    Activity Tolerance Patient tolerated treatment well    Behavior During Therapy WFL for tasks assessed/performed             Past Medical History:  Diagnosis Date   Bipolar 1 disorder ([place])    Bowel incontinence    Depression    Diabetes mellitus without complication ([place])    Mental disability     Past Surgical History:  Procedure Laterality Date   FOOT SURGERY     KNEE ARTHROSCOPY W/ ACL RECONSTRUCTION Left 10/12/2020    There were no vitals filed for this visit.   Subjective Assessment - 06/20/21 1609     Subjective Pt presents to PT with reports no current reports of LBP. She has been compliant with her HEP with adverse effect. She is ready to begin PT treatment at this time.    Currently in Pain? No/denies    Pain Score 0-No pain           [facility] Adult PT Treatment/Exercise:   Therapeutic Exercise:  NuStep lvl 5 UE/LE x 5 min while taking subjective  Bridge 2x10 LTR x 10 Sit to stand 2x10 - no UE support Fwd ball rollouts 2x10 blue physioball Total gym leg press 2x10 75lbs Standing hip ext 2x10 50lbs                               PT Short Term Goals - 06/07/21 1607       PT SHORT TERM GOAL #1   Title Pt will be compliant and knowledgeable with initial HEP for improved carryover    Baseline initial HEP given    Time 3    Period Weeks    Status New    Target Date  06/28/21               PT Long Term Goals - 06/07/21 1607       PT LONG TERM GOAL #1   Title Pt will improve FOTO to no less than 73% function as proxy for functional improvement    Baseline 50% function    Time 8    Period Weeks    Status New    Target Date 08/02/21      PT LONG TERM GOAL #2   Title Pt will increase 30 Sec STS to no less than 10 reps for improved comfort and functional mobility    Baseline 8 reps    Time 8    Period Weeks    Status New    Target Date 08/02/21      PT LONG TERM GOAL #3   Title Pt will self report LBP no greater than 3/10 at worst for improved comfort and function    Baseline 8/10 at worst    Time 8  Period Weeks    Status New    Target Date 08/02/21      PT LONG TERM GOAL #4   Title Pt will be knowledgeable with advanced HEP for continued improvement post d/c from Pt    Time 8    Period Weeks    Status New    Target Date 08/02/21                   Plan - 06/20/21 1618     Clinical Impression Statement Pt was able to complete all prescribed exercises with no adverse effect or increase in pain. Today's session focused on improving lumbar AROM and increasing proximal hip strength. Pt continues to benefit from skilled PT working to improve strength and decrease pain. Will continue to progress exercises as tolerated per POC.    PT Treatment/Interventions ADLs/Self Care Home Management;Cryotherapy;Electrical Stimulation;Iontophoresis 4mg /ml Dexamethasone;Moist Heat;Ultrasound;Gait training;Stair training;Functional mobility training;Therapeutic activities;Therapeutic exercise;Balance training;Neuromuscular re-education;Patient/family education;Manual techniques;Passive range of motion;Taping;Vasopneumatic Device;Dry needling    PT Next Visit Plan assess response to HEP; progress core and proximal hip strengthening and strengthening of R QL    PT Home Exercise Plan Access Code: [email or web address]             Patient will benefit  from skilled therapeutic intervention in order to improve the following deficits and impairments:  Improper body mechanics, Increased muscle spasms, Pain, Decreased activity tolerance, Postural dysfunction, Difficulty walking, Abnormal gait, Decreased strength, Decreased endurance, Increased edema, Decreased balance  Visit Diagnosis: Chronic right-sided low back pain without sciatica  Muscle weakness (generalized)     Problem List Patient Active Problem List   Diagnosis Date Noted   Pain in left elbow 07/27/2020   Prediabetes 02/03/2019   Obstructive sleep apnea 04/23/2018   Other fatigue 11/26/2017   Shortness of breath on exertion 11/26/2017   Type 2 diabetes mellitus without complication, without long-term current use of insulin ([place]) 11/26/2017   Other hyperlipidemia 11/26/2017   Vitamin D deficiency 11/26/2017   Schizoaffective disorder, bipolar type ([place]) 09/14/2014   Intellectual disability 09/10/2014    Ward Chatters, PT 06/20/2021, 4:53 PM  [facility] [address] [place], [place], [address] Phone: [phone]   Fax:  [phone]  Name: SAFIRE GORDIN MRN: [id number] Date of Birth: [birth year]

## 2021-06-21 ENCOUNTER — Ambulatory Visit (INDEPENDENT_AMBULATORY_CARE_PROVIDER_SITE_OTHER): Payer: Medicare Other | Admitting: Surgery

## 2021-06-21 ENCOUNTER — Encounter: Payer: Self-pay | Admitting: Surgery

## 2021-06-21 VITALS — BP 143/92 | HR 86 | Ht 71.0 in | Wt 306.0 lb

## 2021-06-21 DIAGNOSIS — M545 Low back pain, unspecified: Secondary | ICD-10-CM

## 2021-06-21 DIAGNOSIS — S39012A Strain of muscle, fascia and tendon of lower back, initial encounter: Secondary | ICD-10-CM

## 2021-06-22 ENCOUNTER — Ambulatory Visit: Payer: Medicare Other

## 2021-06-22 NOTE — Progress Notes (Signed)
30 year old female returns for recheck of her low back pain.  States that she has had some improvement with physical therapy.  No complaints of lower extremity radicular symptoms.  Exam Pleasant female alert and oriented in no acute distress.  Gait is normal.  Plan Patient will continue formal PT.  Can also continue Advil.  Since she is improving at this point I will have her see me in 6 weeks for recheck.  I did asked her to contact me in 4 weeks to let me know how she is feeling.  If her pain is not improving I will plan to go ahead and schedule lumbar MRI at that time.  Patient comfortable with this plan.  All questions answered.

## 2021-07-03 DIAGNOSIS — M542 Cervicalgia: Secondary | ICD-10-CM | POA: Diagnosis not present

## 2021-07-03 DIAGNOSIS — M791 Myalgia, unspecified site: Secondary | ICD-10-CM | POA: Diagnosis not present

## 2021-07-03 DIAGNOSIS — G43719 Chronic migraine without aura, intractable, without status migrainosus: Secondary | ICD-10-CM | POA: Diagnosis not present

## 2021-07-04 ENCOUNTER — Ambulatory Visit: Payer: Medicare Other

## 2021-07-04 ENCOUNTER — Other Ambulatory Visit: Payer: Self-pay

## 2021-07-04 DIAGNOSIS — M6281 Muscle weakness (generalized): Secondary | ICD-10-CM | POA: Diagnosis not present

## 2021-07-04 DIAGNOSIS — M545 Low back pain, unspecified: Secondary | ICD-10-CM | POA: Diagnosis not present

## 2021-07-04 DIAGNOSIS — G8929 Other chronic pain: Secondary | ICD-10-CM

## 2021-07-04 NOTE — Therapy (Signed)
[place], [place], [address] Phone: [phone]   Fax:  [phone]  Physical Therapy Treatment  Patient Details  Name: Cindy Phillips MRN: [id number] Date of Birth: 1990-08-28 Referring Provider (PT): Lucianne Lei, MD   Encounter Date: 07/04/2021   PT End of Session - 07/04/21 1606     Visit Number 3    Number of Visits 17    Date for PT Re-Evaluation 08/02/21    Authorization Type UHC MCR    PT Start Time 9449    PT Stop Time 1648    PT Time Calculation (min) 38 min    Activity Tolerance Patient tolerated treatment well    Behavior During Therapy WFL for tasks assessed/performed             Past Medical History:  Diagnosis Date   Bipolar 1 disorder ([place])    Bowel incontinence    Depression    Diabetes mellitus without complication ([place])    Mental disability     Past Surgical History:  Procedure Laterality Date   FOOT SURGERY     KNEE ARTHROSCOPY W/ ACL RECONSTRUCTION Left 10/12/2020    There were no vitals filed for this visit.   Subjective Assessment - 07/04/21 1606     Subjective Pt presents to PT with no current reports of LBP, stating she has been feeling much better. She continues to be compliant with her HEP with no adverse effect. She is ready to begin PT at this time.    Currently in Pain? No/denies    Pain Score 0-No pain           [facility] Adult PT Treatment/Exercise:   Therapeutic Exercise:  NuStep lvl 5 UE/LE x 5 min while taking subjective  Bridge 2x10 Sit to stand 3x10 - 10lb KB Lateral walk red tband 4x55ft Cybex leg press 2x12 80lbs KB deadlift 10lb 2x10 Fwd ball rollouts 2x10 greenn physioball 5" Total gym leg press 2x10 75lbs Standing hip ext x10 50lbs ea                               PT Short Term Goals - 06/07/21 1607       PT SHORT TERM GOAL #1   Title Pt will be compliant and knowledgeable with initial HEP for  improved carryover    Baseline initial HEP given    Time 3    Period Weeks    Status New    Target Date 06/28/21               PT Long Term Goals - 06/07/21 1607       PT LONG TERM GOAL #1   Title Pt will improve FOTO to no less than 73% function as proxy for functional improvement    Baseline 50% function    Time 8    Period Weeks    Status New    Target Date 08/02/21      PT LONG TERM GOAL #2   Title Pt will increase 30 Sec STS to no less than 10 reps for improved comfort and functional mobility    Baseline 8 reps    Time 8    Period Weeks    Status New    Target Date 08/02/21      PT LONG TERM GOAL #3   Title Pt will self report LBP no greater than 3/10 at worst for  improved comfort and function    Baseline 8/10 at worst    Time 8    Period Weeks    Status New    Target Date 08/02/21      PT LONG TERM GOAL #4   Title Pt will be knowledgeable with advanced HEP for continued improvement post d/c from Pt    Time 8    Period Weeks    Status New    Target Date 08/02/21                   Plan - 07/04/21 1643     Clinical Impression Statement Pt was able to complete all prescribed exercises, with increased progression of difficulty today. We focused on improvog proximal hip and lumbar extensor strength. She continues benefit from skilled PT services, working on improving strength and decreasing LBP.    PT Treatment/Interventions ADLs/Self Care Home Management;Cryotherapy;Electrical Stimulation;Iontophoresis 4mg /ml Dexamethasone;Moist Heat;Ultrasound;Gait training;Stair training;Functional mobility training;Therapeutic activities;Therapeutic exercise;Balance training;Neuromuscular re-education;Patient/family education;Manual techniques;Passive range of motion;Taping;Vasopneumatic Device;Dry needling    PT Next Visit Plan assess response to HEP; progress core and proximal hip strengthening and strengthening of R QL    PT Home Exercise Plan Access Code:  [email or web address]             Patient will benefit from skilled therapeutic intervention in order to improve the following deficits and impairments:  Improper body mechanics, Increased muscle spasms, Pain, Decreased activity tolerance, Postural dysfunction, Difficulty walking, Abnormal gait, Decreased strength, Decreased endurance, Increased edema, Decreased balance  Visit Diagnosis: Chronic right-sided low back pain without sciatica  Muscle weakness (generalized)     Problem List Patient Active Problem List   Diagnosis Date Noted   Pain in left elbow 07/27/2020   Prediabetes 02/03/2019   Obstructive sleep apnea 04/23/2018   Other fatigue 11/26/2017   Shortness of breath on exertion 11/26/2017   Type 2 diabetes mellitus without complication, without long-term current use of insulin ([place]) 11/26/2017   Other hyperlipidemia 11/26/2017   Vitamin D deficiency 11/26/2017   Schizoaffective disorder, bipolar type ([place]) 09/14/2014   Intellectual disability 09/10/2014    Ward Chatters, PT 07/04/2021, 4:50 PM  [facility] [address] [place], [place], [address] Phone: [phone]   Fax:  [phone]  Name: Cindy Phillips MRN: [id number] Date of Birth: 03-17-1991

## 2021-07-06 ENCOUNTER — Other Ambulatory Visit: Payer: Self-pay

## 2021-07-06 ENCOUNTER — Ambulatory Visit: Payer: Medicare Other

## 2021-07-06 DIAGNOSIS — G8929 Other chronic pain: Secondary | ICD-10-CM

## 2021-07-06 DIAGNOSIS — M545 Low back pain, unspecified: Secondary | ICD-10-CM | POA: Diagnosis not present

## 2021-07-06 DIAGNOSIS — M6281 Muscle weakness (generalized): Secondary | ICD-10-CM | POA: Diagnosis not present

## 2021-07-06 NOTE — Therapy (Signed)
[place], [place], [address] Phone: [phone]   Fax:  [phone]  Physical Therapy Treatment  Patient Details  Name: Cindy Phillips MRN: [id number] Date of Birth: 04-09-1991 Referring Provider (PT): Lucianne Lei, MD   Encounter Date: 07/06/2021   PT End of Session - 07/06/21 1617     Visit Number 4    Number of Visits 17    Date for PT Re-Evaluation 08/02/21    Authorization Type UHC MCR    PT Start Time 0630    PT Stop Time 1655    PT Time Calculation (min) 38 min    Activity Tolerance Patient tolerated treatment well    Behavior During Therapy WFL for tasks assessed/performed             Past Medical History:  Diagnosis Date   Bipolar 1 disorder ([place])    Bowel incontinence    Depression    Diabetes mellitus without complication ([place])    Mental disability     Past Surgical History:  Procedure Laterality Date   FOOT SURGERY     KNEE ARTHROSCOPY W/ ACL RECONSTRUCTION Left 10/12/2020    There were no vitals filed for this visit.   Subjective Assessment - 07/06/21 1618     Subjective Pt presents to PT with reports of lower back stiffness. Pt has been compliant with HEP per reports. She is ready to begin PT treatment at this time.    Currently in Pain? No/denies    Pain Score 0-No pain           [facility] Adult PT Treatment/Exercise:   Therapeutic Exercise:  NuStep lvl 5 UE/LE x 5 min while taking subjective  Bridge 2x10 Sit to stand 3x10 - 15lb KB Lateral walk green tband 2x15ft Cybex leg press 2x12 80lbs KB deadlift 10lb 2x10 Fwd ball rollouts 2x10 blue physioball 5" Total gym leg press 2x10 100lbs Standing hip ext x10 50lbs ea LAQ 2x10 5lbs Seated march 2x20 5lbs                              PT Short Term Goals - 07/06/21 1911       PT SHORT TERM GOAL #1   Title Pt will be compliant and knowledgeable with initial HEP for improved  carryover    Baseline initial HEP given    Time 3    Period Weeks    Status Achieved    Target Date 06/28/21               PT Long Term Goals - 06/07/21 1607       PT LONG TERM GOAL #1   Title Pt will improve FOTO to no less than 73% function as proxy for functional improvement    Baseline 50% function    Time 8    Period Weeks    Status New    Target Date 08/02/21      PT LONG TERM GOAL #2   Title Pt will increase 30 Sec STS to no less than 10 reps for improved comfort and functional mobility    Baseline 8 reps    Time 8    Period Weeks    Status New    Target Date 08/02/21      PT LONG TERM GOAL #3   Title Pt will self report LBP no greater than 3/10 at worst for improved comfort and function  Baseline 8/10 at worst    Time 8    Period Weeks    Status New    Target Date 08/02/21      PT LONG TERM GOAL #4   Title Pt will be knowledgeable with advanced HEP for continued improvement post d/c from Pt    Time 8    Period Weeks    Status New    Target Date 08/02/21                   Plan - 07/06/21 1621     Clinical Impression Statement Pt was able to complete all prescribed exercises with no adverse effect. Today's session continued to focus on improving core and proximal hip strength. She continues to benefit from skilled PT, as she has continued to report decreases in lower back pain and discomfort. Will continue to focus on increasing core and posterior chain strength and progress as able.    PT Treatment/Interventions ADLs/Self Care Home Management;Cryotherapy;Electrical Stimulation;Iontophoresis 4mg /ml Dexamethasone;Moist Heat;Ultrasound;Gait training;Stair training;Functional mobility training;Therapeutic activities;Therapeutic exercise;Balance training;Neuromuscular re-education;Patient/family education;Manual techniques;Passive range of motion;Taping;Vasopneumatic Device;Dry needling    PT Next Visit Plan assess response to HEP; progress core and  proximal hip strengthening and strengthening of R QL    PT Home Exercise Plan Access Code: [email or web address]             Patient will benefit from skilled therapeutic intervention in order to improve the following deficits and impairments:  Improper body mechanics, Increased muscle spasms, Pain, Decreased activity tolerance, Postural dysfunction, Difficulty walking, Abnormal gait, Decreased strength, Decreased endurance, Increased edema, Decreased balance  Visit Diagnosis: Chronic right-sided low back pain without sciatica  Muscle weakness (generalized)     Problem List Patient Active Problem List   Diagnosis Date Noted   Pain in left elbow 07/27/2020   Prediabetes 02/03/2019   Obstructive sleep apnea 04/23/2018   Other fatigue 11/26/2017   Shortness of breath on exertion 11/26/2017   Type 2 diabetes mellitus without complication, without long-term current use of insulin ([place]) 11/26/2017   Other hyperlipidemia 11/26/2017   Vitamin D deficiency 11/26/2017   Schizoaffective disorder, bipolar type ([place]) 09/14/2014   Intellectual disability 09/10/2014    Ward Chatters, PT 07/06/2021, 7:12 PM  [facility] [address] [place], [place], [address] Phone: [phone]   Fax:  [phone]  Name: WESLYN HOLSONBACK MRN: [id number] Date of Birth: 06-04-1991

## 2021-07-10 ENCOUNTER — Other Ambulatory Visit: Payer: Self-pay

## 2021-07-10 ENCOUNTER — Encounter: Payer: Self-pay | Admitting: Physical Therapy

## 2021-07-10 ENCOUNTER — Ambulatory Visit: Payer: Medicare Other | Admitting: Physical Therapy

## 2021-07-10 DIAGNOSIS — G8929 Other chronic pain: Secondary | ICD-10-CM | POA: Diagnosis not present

## 2021-07-10 DIAGNOSIS — M6281 Muscle weakness (generalized): Secondary | ICD-10-CM

## 2021-07-10 DIAGNOSIS — M545 Low back pain, unspecified: Secondary | ICD-10-CM | POA: Diagnosis not present

## 2021-07-10 NOTE — Therapy (Signed)
[place], [place], [address] Phone: [phone]   Fax:  [phone]  Physical Therapy Treatment  Patient Details  Name: Cindy Phillips MRN: [id number] Date of Birth: July 10, 1991 Referring Provider (PT): Lucianne Lei, MD   Encounter Date: 07/10/2021   PT End of Session - 07/10/21 1510     Visit Number 5    Number of Visits 17    Date for PT Re-Evaluation 08/02/21    Authorization Type UHC MCR    PT Start Time 0712    PT Stop Time 1975    PT Time Calculation (min) 43 min    Activity Tolerance Patient tolerated treatment well    Behavior During Therapy WFL for tasks assessed/performed             Past Medical History:  Diagnosis Date   Bipolar 1 disorder ([place])    Bowel incontinence    Depression    Diabetes mellitus without complication ([place])    Mental disability     Past Surgical History:  Procedure Laterality Date   FOOT SURGERY     KNEE ARTHROSCOPY W/ ACL RECONSTRUCTION Left 10/12/2020    There were no vitals filed for this visit.   Subjective Assessment - 07/10/21 1507     Subjective " I am not having that much pain its about 2/10 today. it occurs when I am lifting a case of water.    Patient Stated Goals Pt would like to decrease R sided LBP in order to pick up objects from the floor and improve overall comfort    Currently in Pain? Yes    Pain Score 2     Pain Location Back    Pain Orientation Right    Pain Descriptors / Indicators Sore    Pain Type Chronic pain    Pain Onset More than a month ago    Pain Frequency Intermittent    Aggravating Factors  lifting case of water    Pain Relieving Factors meds, exercise                                 [facility] Adult PT Treatment/Exercise:  Therapeutic Exercise: Prone bird dog 2 x 10 Verbal cues for proper form Dead bug 2 x 5 holding 10 sec Ellipitical L1 x 4 min Ramp L1 Low back stretch seated 2 x 30  sec   Manual Therapy:  MTPR bil lumbar paraspinal  Neuromuscular re-ed: N/A  Therapeutic Activity: Lifting from 6 inch step to waist 2 x 10 10# Demonstration and cues for proper form  Modalities: N/A  Self Care: N/A  Consider / progression for next session:          PT Short Term Goals - 07/06/21 1911       PT SHORT TERM GOAL #1   Title Pt will be compliant and knowledgeable with initial HEP for improved carryover    Baseline initial HEP given    Time 3    Period Weeks    Status Achieved    Target Date 06/28/21               PT Long Term Goals - 06/07/21 1607       PT LONG TERM GOAL #1   Title Pt will improve FOTO to no less than 73% function as proxy for functional improvement    Baseline 50% function    Time 8  Period Weeks    Status New    Target Date 08/02/21      PT LONG TERM GOAL #2   Title Pt will increase 30 Sec STS to no less than 10 reps for improved comfort and functional mobility    Baseline 8 reps    Time 8    Period Weeks    Status New    Target Date 08/02/21      PT LONG TERM GOAL #3   Title Pt will self report LBP no greater than 3/10 at worst for improved comfort and function    Baseline 8/10 at worst    Time 8    Period Weeks    Status New    Target Date 08/02/21      PT LONG TERM GOAL #4   Title Pt will be knowledgeable with advanced HEP for continued improvement post d/c from Pt    Time 8    Period Weeks    Status New    Target Date 08/02/21                   Plan - 07/10/21 1547     Clinical Impression Statement pt arrives to session reporting low back pain today rated at 2/10. She responded favorably to [facility] along the lumbar paraspinals followed with IASTM techniques.    PT Treatment/Interventions ADLs/Self Care Home Management;Cryotherapy;Electrical Stimulation;Iontophoresis 4mg /ml Dexamethasone;Moist Heat;Ultrasound;Gait training;Stair training;Functional mobility training;Therapeutic  activities;Therapeutic exercise;Balance training;Neuromuscular re-education;Patient/family education;Manual techniques;Passive range of motion;Taping;Vasopneumatic Device;Dry needling    PT Next Visit Plan assess response to HEP; progress core and proximal hip strengthening and strengthening of R QL    PT Home Exercise Plan Access Code: [email or web address]    Consulted and Agree with Plan of Care Patient             Patient will benefit from skilled therapeutic intervention in order to improve the following deficits and impairments:  Improper body mechanics, Increased muscle spasms, Pain, Decreased activity tolerance, Postural dysfunction, Difficulty walking, Abnormal gait, Decreased strength, Decreased endurance, Increased edema, Decreased balance  Visit Diagnosis: Chronic right-sided low back pain without sciatica  Muscle weakness (generalized)     Problem List Patient Active Problem List   Diagnosis Date Noted   Pain in left elbow 07/27/2020   Prediabetes 02/03/2019   Obstructive sleep apnea 04/23/2018   Other fatigue 11/26/2017   Shortness of breath on exertion 11/26/2017   Type 2 diabetes mellitus without complication, without long-term current use of insulin ([place]) 11/26/2017   Other hyperlipidemia 11/26/2017   Vitamin D deficiency 11/26/2017   Schizoaffective disorder, bipolar type ([place]) 09/14/2014   Intellectual disability 09/10/2014    [name] PT, DPT, LAT, ATC  07/10/21  3:52 PM      [place] [facility] [address] [place], [place], [address] Phone: [phone]   Fax:  [phone]  Name: Cindy Phillips MRN: [id number] Date of Birth: Jul 18, 1991

## 2021-07-12 ENCOUNTER — Ambulatory Visit: Payer: Medicare Other

## 2021-07-12 ENCOUNTER — Other Ambulatory Visit: Payer: Self-pay

## 2021-07-12 DIAGNOSIS — M545 Low back pain, unspecified: Secondary | ICD-10-CM

## 2021-07-12 DIAGNOSIS — G8929 Other chronic pain: Secondary | ICD-10-CM | POA: Diagnosis not present

## 2021-07-12 DIAGNOSIS — M6281 Muscle weakness (generalized): Secondary | ICD-10-CM | POA: Diagnosis not present

## 2021-07-12 NOTE — Therapy (Signed)
[place], [place], [address] Phone: [phone]   Fax:  [phone]  Physical Therapy Treatment  Patient Details  Name: Cindy Phillips MRN: [id number] Date of Birth: [birth year] Referring Provider (PT): Lucianne Lei, MD   Encounter Date: 07/12/2021   PT End of Session - 07/12/21 1651     Visit Number 6    Number of Visits 17    Date for PT Re-Evaluation 08/02/21    Authorization Type UHC MCR    PT Start Time 9628    PT Stop Time 1652    PT Time Calculation (min) 38 min    Activity Tolerance Patient tolerated treatment well    Behavior During Therapy WFL for tasks assessed/performed             Past Medical History:  Diagnosis Date   Bipolar 1 disorder ([place])    Bowel incontinence    Depression    Diabetes mellitus without complication ([place])    Mental disability     Past Surgical History:  Procedure Laterality Date   FOOT SURGERY     KNEE ARTHROSCOPY W/ ACL RECONSTRUCTION Left 10/12/2020    There were no vitals filed for this visit.   Subjective Assessment - 07/12/21 1619     Subjective Pt presents to PT with no LBP but promotes R LE muscle soreness. Is ready to begin PT at this time.    Currently in Pain? No/denies    Pain Score 0-No pain           [facility] Adult PT Treatment/Exercise:   Therapeutic Exercise:  NuStep lvl 5 UE/LE x 5 min while taking subjective  Bridge 3x10 Sit to stand 2x10 - 25lb KB Prone bird dog x 10  Deadbug w/ physioball x 10 Lateral walk green tband 2x70ft Cybex leg press 2x12 100lbs KB deadlift 10lb 2x10 Standing hip ext x10 50lbs ea  Manual Therapy: N/A   Neuromuscular re-ed: N/A   Therapeutic Activity: N/A   Modalities: N/A   Self Care: N/A   Consider / progression for next session:                                PT Short Term Goals - 07/06/21 1911       PT SHORT TERM GOAL #1   Title Pt will be  compliant and knowledgeable with initial HEP for improved carryover    Baseline initial HEP given    Time 3    Period Weeks    Status Achieved    Target Date 06/28/21               PT Long Term Goals - 06/07/21 1607       PT LONG TERM GOAL #1   Title Pt will improve FOTO to no less than 73% function as proxy for functional improvement    Baseline 50% function    Time 8    Period Weeks    Status New    Target Date 08/02/21      PT LONG TERM GOAL #2   Title Pt will increase 30 Sec STS to no less than 10 reps for improved comfort and functional mobility    Baseline 8 reps    Time 8    Period Weeks    Status New    Target Date 08/02/21      PT LONG TERM GOAL #3  Title Pt will self report LBP no greater than 3/10 at worst for improved comfort and function    Baseline 8/10 at worst    Time 8    Period Weeks    Status New    Target Date 08/02/21      PT LONG TERM GOAL #4   Title Pt will be knowledgeable with advanced HEP for continued improvement post d/c from Pt    Time 8    Period Weeks    Status New    Target Date 08/02/21                   Plan - 07/12/21 1638     Clinical Impression Statement Pt was able to complete prescribed exercises, no change in baseline. Today's therapy session focused on improving core endurance andn proximal hip muscle strength. She continues to benefit from skilled PT services in order to decrease pain and improve functional mobilty. Will continue to progress as tolerated per POC.    PT Treatment/Interventions ADLs/Self Care Home Management;Cryotherapy;Electrical Stimulation;Iontophoresis 4mg /ml Dexamethasone;Moist Heat;Ultrasound;Gait training;Stair training;Functional mobility training;Therapeutic activities;Therapeutic exercise;Balance training;Neuromuscular re-education;Patient/family education;Manual techniques;Passive range of motion;Taping;Vasopneumatic Device;Dry needling    PT Next Visit Plan assess response to HEP;  progress core and proximal hip strengthening and strengthening of R QL    PT Home Exercise Plan Access Code: [email or web address]             Patient will benefit from skilled therapeutic intervention in order to improve the following deficits and impairments:  Improper body mechanics, Increased muscle spasms, Pain, Decreased activity tolerance, Postural dysfunction, Difficulty walking, Abnormal gait, Decreased strength, Decreased endurance, Increased edema, Decreased balance  Visit Diagnosis: Chronic right-sided low back pain without sciatica  Muscle weakness (generalized)     Problem List Patient Active Problem List   Diagnosis Date Noted   Pain in left elbow 07/27/2020   Prediabetes 02/03/2019   Obstructive sleep apnea 04/23/2018   Other fatigue 11/26/2017   Shortness of breath on exertion 11/26/2017   Type 2 diabetes mellitus without complication, without long-term current use of insulin ([place]) 11/26/2017   Other hyperlipidemia 11/26/2017   Vitamin D deficiency 11/26/2017   Schizoaffective disorder, bipolar type ([place]) 09/14/2014   Intellectual disability 09/10/2014    Ward Chatters, PT 07/12/2021, 4:54 PM  [facility] [address] [place], [place], [address] Phone: [phone]   Fax:  [phone]  Name: Cindy Phillips MRN: [id number] Date of Birth: 06-05-1991

## 2021-07-17 ENCOUNTER — Telehealth: Payer: Self-pay | Admitting: Surgery

## 2021-07-17 NOTE — Telephone Encounter (Signed)
FYI

## 2021-07-17 NOTE — Telephone Encounter (Signed)
Pt called and states that she is doing okay and physical therapy is going good for her.   CB [phone]

## 2021-07-18 ENCOUNTER — Other Ambulatory Visit: Payer: Self-pay

## 2021-07-18 ENCOUNTER — Ambulatory Visit: Payer: Medicare Other

## 2021-07-18 ENCOUNTER — Other Ambulatory Visit: Payer: Self-pay | Admitting: *Deleted

## 2021-07-18 DIAGNOSIS — G8929 Other chronic pain: Secondary | ICD-10-CM | POA: Diagnosis not present

## 2021-07-18 DIAGNOSIS — M545 Low back pain, unspecified: Secondary | ICD-10-CM | POA: Diagnosis not present

## 2021-07-18 DIAGNOSIS — M6281 Muscle weakness (generalized): Secondary | ICD-10-CM

## 2021-07-18 NOTE — Therapy (Signed)
[place], [place], [address] Phone: [phone]   Fax:  [phone]  Physical Therapy Treatment  Patient Details  Name: Cindy Phillips MRN: [id number] Date of Birth: 08/17/1991 Referring Provider (PT): Lucianne Lei, MD   Encounter Date: 07/18/2021   PT End of Session - 07/18/21 1612     Visit Number 7    Number of Visits 17    Date for PT Re-Evaluation 08/02/21    Authorization Type UHC MCR    PT Start Time 6789    PT Stop Time 1653    PT Time Calculation (min) 38 min    Activity Tolerance Patient tolerated treatment well    Behavior During Therapy WFL for tasks assessed/performed             Past Medical History:  Diagnosis Date   Bipolar 1 disorder ([place])    Bowel incontinence    Depression    Diabetes mellitus without complication ([place])    Mental disability     Past Surgical History:  Procedure Laterality Date   FOOT SURGERY     KNEE ARTHROSCOPY W/ ACL RECONSTRUCTION Left 10/12/2020    There were no vitals filed for this visit.   Subjective Assessment - 07/18/21 1613     Subjective Pt presents to PT with no current LBP, but did have symptoms of pain yesterday after sitting for a long period of time. She is ready to begin PT treatment at this time.    Currently in Pain? No/denies    Pain Score 0-No pain           [facility] Adult PT Treatment/Exercise:   Therapeutic Exercise:  NuStep lvl 5 UE/LE x 5 min while taking subjective  Sit to stand 2x10 - 20lb w/ overhead press Prone bird dog 2x10  Deadbug w/ physioball 2x10 Lateral walk red tband 3x73ft Cybex leg press 4x5 100lbs KB deadlift 25lb KB 3x10   Manual Therapy: N/A   Neuromuscular re-ed: N/A   Therapeutic Activity: N/A   Modalities: N/A   Self Care: N/A   Consider / progression for next session:                                PT Short Term Goals - 07/06/21 1911       PT  SHORT TERM GOAL #1   Title Pt will be compliant and knowledgeable with initial HEP for improved carryover    Baseline initial HEP given    Time 3    Period Weeks    Status Achieved    Target Date 06/28/21               PT Long Term Goals - 06/07/21 1607       PT LONG TERM GOAL #1   Title Pt will improve FOTO to no less than 73% function as proxy for functional improvement    Baseline 50% function    Time 8    Period Weeks    Status New    Target Date 08/02/21      PT LONG TERM GOAL #2   Title Pt will increase 30 Sec STS to no less than 10 reps for improved comfort and functional mobility    Baseline 8 reps    Time 8    Period Weeks    Status New    Target Date 08/02/21      PT  LONG TERM GOAL #3   Title Pt will self report LBP no greater than 3/10 at worst for improved comfort and function    Baseline 8/10 at worst    Time 8    Period Weeks    Status New    Target Date 08/02/21      PT LONG TERM GOAL #4   Title Pt will be knowledgeable with advanced HEP for continued improvement post d/c from Pt    Time 8    Period Weeks    Status New    Target Date 08/02/21                   Plan - 07/18/21 1619     Clinical Impression Statement Pt was able to complete prescribed exercises with no adverse effect. Today's session focused on improving core and proximal hip strength, with pt noting appropriate level of fatigue post session. She continues to benefit from skilled PT services working on improving strength and body mechanics in order to decrease pain and improve functional mobility.    PT Treatment/Interventions ADLs/Self Care Home Management;Cryotherapy;Electrical Stimulation;Iontophoresis 4mg /ml Dexamethasone;Moist Heat;Ultrasound;Gait training;Stair training;Functional mobility training;Therapeutic activities;Therapeutic exercise;Balance training;Neuromuscular re-education;Patient/family education;Manual techniques;Passive range of  motion;Taping;Vasopneumatic Device;Dry needling    PT Next Visit Plan assess response to HEP; progress core and proximal hip strengthening and strengthening of R QL    PT Home Exercise Plan Access Code: [email or web address]             Patient will benefit from skilled therapeutic intervention in order to improve the following deficits and impairments:  Improper body mechanics, Increased muscle spasms, Pain, Decreased activity tolerance, Postural dysfunction, Difficulty walking, Abnormal gait, Decreased strength, Decreased endurance, Increased edema, Decreased balance  Visit Diagnosis: Chronic right-sided low back pain without sciatica  Muscle weakness (generalized)     Problem List Patient Active Problem List   Diagnosis Date Noted   Pain in left elbow 07/27/2020   Prediabetes 02/03/2019   Obstructive sleep apnea 04/23/2018   Other fatigue 11/26/2017   Shortness of breath on exertion 11/26/2017   Type 2 diabetes mellitus without complication, without long-term current use of insulin ([place]) 11/26/2017   Other hyperlipidemia 11/26/2017   Vitamin D deficiency 11/26/2017   Schizoaffective disorder, bipolar type ([place]) 09/14/2014   Intellectual disability 09/10/2014    Ward Chatters, PT 07/18/2021, 4:59 PM  [facility] [address] [place], [place], [address] Phone: [phone]   Fax:  [phone]  Name: VEIDA SPIRA MRN: [id number] Date of Birth: [birth year]

## 2021-07-18 NOTE — Patient Outreach (Signed)
[facility]) Care Management  07/18/2021  Cindy Phillips July 31, 1991 [phone]   RN Health Coach attempted follow up outreach call to patient.  Patient was unavailable. HIPPA compliance voicemail message left with return callback number.  Plan: RN will call patient again within 30 days.  [place] Care Management [phone]

## 2021-07-19 DIAGNOSIS — E785 Hyperlipidemia, unspecified: Secondary | ICD-10-CM | POA: Diagnosis not present

## 2021-07-19 DIAGNOSIS — E1165 Type 2 diabetes mellitus with hyperglycemia: Secondary | ICD-10-CM | POA: Diagnosis not present

## 2021-07-20 ENCOUNTER — Ambulatory Visit: Payer: Medicare Other | Attending: Surgery

## 2021-07-20 ENCOUNTER — Other Ambulatory Visit: Payer: Self-pay

## 2021-07-20 DIAGNOSIS — M545 Low back pain, unspecified: Secondary | ICD-10-CM | POA: Insufficient documentation

## 2021-07-20 DIAGNOSIS — M6281 Muscle weakness (generalized): Secondary | ICD-10-CM | POA: Insufficient documentation

## 2021-07-20 DIAGNOSIS — G8929 Other chronic pain: Secondary | ICD-10-CM | POA: Diagnosis not present

## 2021-07-20 NOTE — Therapy (Signed)
[place], [place], [address] Phone: [phone]   Fax:  [phone]  Physical Therapy Treatment  Patient Details  Name: Cindy Phillips MRN: [id number] Date of Birth: 1990-11-28 Referring Provider (PT): Lucianne Lei, MD   Encounter Date: 07/20/2021   PT End of Session - 07/20/21 1546     Visit Number 8    Number of Visits 17    Date for PT Re-Evaluation 08/02/21    Authorization Type UHC MCR    PT Start Time 0814    PT Stop Time 1656    PT Time Calculation (min) 41 min    Activity Tolerance Patient tolerated treatment well    Behavior During Therapy WFL for tasks assessed/performed             Past Medical History:  Diagnosis Date   Bipolar 1 disorder ([place])    Bowel incontinence    Depression    Diabetes mellitus without complication ([place])    Mental disability     Past Surgical History:  Procedure Laterality Date   FOOT SURGERY     KNEE ARTHROSCOPY W/ ACL RECONSTRUCTION Left 10/12/2020    There were no vitals filed for this visit.   Subjective Assessment - 07/20/21 1616     Subjective Patient reports her back is feeling good today without reports of pain. She reports soreness after last session that about 30 minutes. She reports compliance with HEP.    Currently in Pain? No/denies                [facility] PT Assessment - 07/20/21 0001       Observation/Other Assessments   Focus on Therapeutic Outcomes (FOTO)  59% function           [facility] Adult PT Treatment/Exercise:   Therapeutic Exercise:  Elliptical level 1 x 3 minutes  Seated pelvic tilts 1 x 10 Supine pelvic tilts 1 x 10, heavy cues  Supine march 2 x 10 with posterior pelvic tilt  Sidelying hip circles CW/CCW 2 x 10 bilateral  Attempted 90/90 isometric hold (tabletop position) though unable to maintain core activation.  Seated alternating arm/leg reaching 2 x 20 Hip bridge on stability ball 2 x 10   Not  performed today: Sit to stand 2x10 - 20lb w/ overhead press Prone bird dog 2x10  Deadbug w/ physioball 2x10 Lateral walk red tband 3x12ft Cybex leg press 4x5 100lbs KB deadlift 25lb KB 3x10   Manual Therapy N/A   Neuromuscular re-ed: N/A   Therapeutic Activity: N/A   Modalities: N/A   Self Care: FOTO score                                      PT Short Term Goals - 07/06/21 1911       PT SHORT TERM GOAL #1   Title Pt will be compliant and knowledgeable with initial HEP for improved carryover    Baseline initial HEP given    Time 3    Period Weeks    Status Achieved    Target Date 06/28/21               PT Long Term Goals - 06/07/21 1607       PT LONG TERM GOAL #1   Title Pt will improve FOTO to no less than 73% function as proxy for functional improvement  Baseline 50% function    Time 8    Period Weeks    Status New    Target Date 08/02/21      PT LONG TERM GOAL #2   Title Pt will increase 30 Sec STS to no less than 10 reps for improved comfort and functional mobility    Baseline 8 reps    Time 8    Period Weeks    Status New    Target Date 08/02/21      PT LONG TERM GOAL #3   Title Pt will self report LBP no greater than 3/10 at worst for improved comfort and function    Baseline 8/10 at worst    Time 8    Period Weeks    Status New    Target Date 08/02/21      PT LONG TERM GOAL #4   Title Pt will be knowledgeable with advanced HEP for continued improvement post d/c from Pt    Time 8    Period Weeks    Status New    Target Date 08/02/21                   Plan - 07/20/21 1600     Clinical Impression Statement Patient tolerated session well today without reports of pain. FOTO score has improved compared to baseline scoring at 59% function today. Able to progress dynamic core stabilization and hip strengthening with patient quickly fatiguing with all exercises today.  She was unable to maintain  neutral spine in tabletop position due to core weakness, so requires continued emphasis on improving core stabilization. During seated ther ex she required consistent postural cues. She reported muscle fatigue at conclusion of session.    PT Treatment/Interventions ADLs/Self Care Home Management;Cryotherapy;Electrical Stimulation;Iontophoresis 4mg /ml Dexamethasone;Moist Heat;Ultrasound;Gait training;Stair training;Functional mobility training;Therapeutic activities;Therapeutic exercise;Balance training;Neuromuscular re-education;Patient/family education;Manual techniques;Passive range of motion;Taping;Vasopneumatic Device;Dry needling    PT Next Visit Plan assess response to HEP; progress core and proximal hip strengthening and strengthening of R QL    PT Home Exercise Plan Access Code: [email or web address]    Consulted and Agree with Plan of Care Patient             Patient will benefit from skilled therapeutic intervention in order to improve the following deficits and impairments:  Improper body mechanics, Increased muscle spasms, Pain, Decreased activity tolerance, Postural dysfunction, Difficulty walking, Abnormal gait, Decreased strength, Decreased endurance, Increased edema, Decreased balance  Visit Diagnosis: Chronic right-sided low back pain without sciatica  Muscle weakness (generalized)     Problem List Patient Active Problem List   Diagnosis Date Noted   Pain in left elbow 07/27/2020   Prediabetes 02/03/2019   Obstructive sleep apnea 04/23/2018   Other fatigue 11/26/2017   Shortness of breath on exertion 11/26/2017   Type 2 diabetes mellitus without complication, without long-term current use of insulin ([place]) 11/26/2017   Other hyperlipidemia 11/26/2017   Vitamin D deficiency 11/26/2017   Schizoaffective disorder, bipolar type ([place]) 09/14/2014   Intellectual disability 09/10/2014   Gwendolyn Grant, PT, DPT, ATC 07/20/21 4:58 PM   [place]  [facility] [address] [place], [place], [address] Phone: [phone]   Fax:  [phone]  Name: Cindy Phillips MRN: [id number] Date of Birth: 1990-11-09

## 2021-07-27 ENCOUNTER — Ambulatory Visit: Payer: Medicare Other

## 2021-07-27 ENCOUNTER — Other Ambulatory Visit: Payer: Self-pay

## 2021-07-27 DIAGNOSIS — M6281 Muscle weakness (generalized): Secondary | ICD-10-CM

## 2021-07-27 DIAGNOSIS — M545 Low back pain, unspecified: Secondary | ICD-10-CM | POA: Diagnosis not present

## 2021-07-27 DIAGNOSIS — G8929 Other chronic pain: Secondary | ICD-10-CM | POA: Diagnosis not present

## 2021-07-27 NOTE — Therapy (Signed)
[place], [place], [address] Phone: [phone]   Fax:  [phone]  Physical Therapy Treatment  Patient Details  Name: Cindy Phillips MRN: [id number] Date of Birth: Sep 29, 1990 Referring Provider (PT): Lucianne Lei, MD   Encounter Date: 07/27/2021   PT End of Session - 07/27/21 1354     Visit Number 9    Number of Visits 17    Date for PT Re-Evaluation 08/02/21    Authorization Type UHC MCR    PT Start Time 6962    PT Stop Time 1433    PT Time Calculation (min) 38 min    Activity Tolerance Patient tolerated treatment well    Behavior During Therapy WFL for tasks assessed/performed             Past Medical History:  Diagnosis Date   Bipolar 1 disorder ([place])    Bowel incontinence    Depression    Diabetes mellitus without complication ([place])    Mental disability     Past Surgical History:  Procedure Laterality Date   FOOT SURGERY     KNEE ARTHROSCOPY W/ ACL RECONSTRUCTION Left 10/12/2020    There were no vitals filed for this visit.   Subjective Assessment - 07/27/21 1354     Subjective Pt presents to PT with reports of LBP the previous day after loading laundry and with migraine symptoms. She has been compliant with her HEP with no adverse effect. She is ready to begin PT treatment at this time.    Currently in Pain? No/denies    Pain Score 0-No pain           [facility] Adult PT Treatment/Exercise:   Therapeutic Exercise:  Elliptical level 1 x 4 minutes  STS 15lb KB 2x15 Step ups x 10 ea 8in Cybex hip abd 2x10 30lbs Cybex hip ext x 10 30lbs ea Sidelying hip circles CW/CCW 2 x 10 bilateral  Seated alternating arm/leg reaching 2 x 20 Hip bridge on stability ball 2 x 10    Not performed today: Sit to stand 2x10 - 20lb w/ overhead press Prone bird dog 2x10  Deadbug w/ physioball 2x10 Lateral walk red tband 3x57ft Cybex leg press 4x5 100lbs KB deadlift 25lb KB 3x10 Seated  pelvic tilts 1 x 10 Supine pelvic tilts 1 x 10, heavy cues  Supine march 2 x 10 with posterior pelvic tilt    Manual Therapy N/A   Neuromuscular re-ed: N/A   Therapeutic Activity: N/A   Modalities: N/A   Self Care: N/A                               PT Short Term Goals - 07/06/21 1911       PT SHORT TERM GOAL #1   Title Pt will be compliant and knowledgeable with initial HEP for improved carryover    Baseline initial HEP given    Time 3    Period Weeks    Status Achieved    Target Date 06/28/21               PT Long Term Goals - 06/07/21 1607       PT LONG TERM GOAL #1   Title Pt will improve FOTO to no less than 73% function as proxy for functional improvement    Baseline 50% function    Time 8    Period Weeks    Status New  Target Date 08/02/21      PT LONG TERM GOAL #2   Title Pt will increase 30 Sec STS to no less than 10 reps for improved comfort and functional mobility    Baseline 8 reps    Time 8    Period Weeks    Status New    Target Date 08/02/21      PT LONG TERM GOAL #3   Title Pt will self report LBP no greater than 3/10 at worst for improved comfort and function    Baseline 8/10 at worst    Time 8    Period Weeks    Status New    Target Date 08/02/21      PT LONG TERM GOAL #4   Title Pt will be knowledgeable with advanced HEP for continued improvement post d/c from Pt    Time 8    Period Weeks    Status New    Target Date 08/02/21                   Plan - 07/27/21 1434     Clinical Impression Statement Pt was able to complete all prescribed exercises with no adverse effect or increase in pain. Therapy today continued posterior chain strengthening and improving functional mobility. She does have continued difficulty with neutral spine positoining, but is progressing well overall. She has f/u visit with MD next week and will be seen for last visit in [place] after.    PT Treatment/Interventions  ADLs/Self Care Home Management;Cryotherapy;Electrical Stimulation;Iontophoresis 4mg /ml Dexamethasone;Moist Heat;Ultrasound;Gait training;Stair training;Functional mobility training;Therapeutic activities;Therapeutic exercise;Balance training;Neuromuscular re-education;Patient/family education;Manual techniques;Passive range of motion;Taping;Vasopneumatic Device;Dry needling    PT Next Visit Plan assess response to HEP; progress core and proximal hip strengthening and strengthening of R QL    PT Home Exercise Plan Access Code: [email or web address]             Patient will benefit from skilled therapeutic intervention in order to improve the following deficits and impairments:  Improper body mechanics, Increased muscle spasms, Pain, Decreased activity tolerance, Postural dysfunction, Difficulty walking, Abnormal gait, Decreased strength, Decreased endurance, Increased edema, Decreased balance  Visit Diagnosis: Chronic right-sided low back pain without sciatica  Muscle weakness (generalized)     Problem List Patient Active Problem List   Diagnosis Date Noted   Pain in left elbow 07/27/2020   Prediabetes 02/03/2019   Obstructive sleep apnea 04/23/2018   Other fatigue 11/26/2017   Shortness of breath on exertion 11/26/2017   Type 2 diabetes mellitus without complication, without long-term current use of insulin ([place]) 11/26/2017   Other hyperlipidemia 11/26/2017   Vitamin D deficiency 11/26/2017   Schizoaffective disorder, bipolar type ([place]) 09/14/2014   Intellectual disability 09/10/2014    Ward Chatters, PT 07/27/2021, 2:37 PM  [facility] [address] [place], [place], [address] Phone: [phone]   Fax:  [phone]  Name: Cindy Phillips MRN: [id number] Date of Birth: 01/12/1991

## 2021-08-02 ENCOUNTER — Encounter: Payer: Self-pay | Admitting: Surgery

## 2021-08-02 ENCOUNTER — Ambulatory Visit (INDEPENDENT_AMBULATORY_CARE_PROVIDER_SITE_OTHER): Payer: Medicare Other | Admitting: Surgery

## 2021-08-02 ENCOUNTER — Ambulatory Visit: Payer: Medicare Other

## 2021-08-02 ENCOUNTER — Other Ambulatory Visit: Payer: Self-pay

## 2021-08-02 VITALS — BP 140/70 | HR 91

## 2021-08-02 DIAGNOSIS — M5416 Radiculopathy, lumbar region: Secondary | ICD-10-CM

## 2021-08-02 DIAGNOSIS — M6281 Muscle weakness (generalized): Secondary | ICD-10-CM | POA: Diagnosis not present

## 2021-08-02 DIAGNOSIS — M545 Low back pain, unspecified: Secondary | ICD-10-CM | POA: Diagnosis not present

## 2021-08-02 DIAGNOSIS — G8929 Other chronic pain: Secondary | ICD-10-CM

## 2021-08-03 NOTE — Therapy (Signed)
[place], [place], [address] Phone: [phone]   Fax:  [phone]  Physical Therapy Treatment/Progress Note  Progress Note Reporting Period 06/07/2021 to 08/02/2021  See note below for Objective Data and Assessment of Progress/Goals.      Patient Details  Name: Cindy Phillips MRN: [id number] Date of Birth: 04/29/1991 Referring Provider (PT): Lucianne Lei, MD   Encounter Date: 08/02/2021   PT End of Session - 08/02/21 1828     Visit Number 10    Number of Visits 17    Date for PT Re-Evaluation 09/27/21    Authorization Type UHC MCR    PT Start Time 1829    PT Stop Time 1902    PT Time Calculation (min) 33 min    Activity Tolerance Patient tolerated treatment well    Behavior During Therapy WFL for tasks assessed/performed             Past Medical History:  Diagnosis Date   Bipolar 1 disorder ([place])    Bowel incontinence    Depression    Diabetes mellitus without complication ([place])    Mental disability     Past Surgical History:  Procedure Laterality Date   FOOT SURGERY     KNEE ARTHROSCOPY W/ ACL RECONSTRUCTION Left 10/12/2020    There were no vitals filed for this visit.   Subjective Assessment - 08/02/21 1828     Subjective Pt presents to PT with reports of increased LBP, has an upcoming MRI scheduled. She would like to wait until after MRI to determine if she would like to continue therapy, but does think she would benefit from more work on L LE strengthening. She is ready to begin PT at this time.    Currently in Pain? Yes    Pain Score 7     Pain Location Back    Pain Orientation Lower           [facility] Adult PT Treatment/Exercise:   Therapeutic Exercise:  NuStep lvl 5 x 5 min while taking subjective STS 2x15 Lateral walk red tband 3x71ft Bridge x 10 - 3" Standing lumbar extension x 10   Therapeutic Activity: Assessment of tests/measures, goals, and outcomes  for purposes of progress note     [facility] PT Assessment - 08/03/21 0001       Observation/Other Assessments   Focus on Therapeutic Outcomes (FOTO)  60% function      Sit to Stand   Comments 30"STS" 12 reps                                    PT Education - 08/03/21 1748     Education Details HEP update and POC    Person(s) Educated Patient    Methods Explanation;Demonstration;Handout    Comprehension Verbalized understanding;Returned demonstration              PT Short Term Goals - 07/06/21 1911       PT SHORT TERM GOAL #1   Title Pt will be compliant and knowledgeable with initial HEP for improved carryover    Baseline initial HEP given    Time 3    Period Weeks    Status Achieved    Target Date 06/28/21               PT Long Term Goals - 08/02/21 1843       PT  LONG TERM GOAL #1   Title Pt will improve FOTO to no less than 73% function as proxy for functional improvement    Baseline 50% function; 60% function on 12/14    Time 8    Period Weeks    Status On-going    Target Date 09/27/21      PT LONG TERM GOAL #2   Title Pt will increase 30 Sec STS to no less than 10 reps for improved comfort and functional mobility    Baseline 8 reps; 12 reps on 12/14    Time 8    Period Weeks    Status Achieved    Target Date --      PT LONG TERM GOAL #3   Title Pt will self report LBP no greater than 3/10 at worst for improved comfort and function    Baseline 8/10 at worst    Time 8    Period Weeks    Status On-going    Target Date 09/27/21      PT LONG TERM GOAL #4   Title Pt will be knowledgeable with advanced HEP for continued improvement post d/c from Pt    Time 8    Period Weeks    Status Achieved    Target Date --                   Plan - 08/03/21 1517     Clinical Impression Statement Pt was able to complete prescribed exercises with no adverse effect. Over the course of PT treatment, pt has made some  improvement in functional mobility and overall function, as evidenced by meeting 30"STS goal and improved FOTO score. Unfortunately, she has had continued LBP lately with radicular symptoms into L gluteal. She also has continued L LE weakness s/p L ACL repair earlier this year that has lingered. Pt has an upcoming MRI scheduled for lumbar spine but would also continue to benefit from skilled PT services for improving core strength and lumbar AROM. She is also going to ask for script working on improving L knee strength, as she continues to have swelling and mobility deficits related to this. Will continue to progress as tolerated.    PT Frequency 2x / week    PT Duration 8 weeks    PT Treatment/Interventions ADLs/Self Care Home Management;Cryotherapy;Electrical Stimulation;Iontophoresis 4mg /ml Dexamethasone;Moist Heat;Ultrasound;Gait training;Stair training;Functional mobility training;Therapeutic activities;Therapeutic exercise;Balance training;Neuromuscular re-education;Patient/family education;Manual techniques;Passive range of motion;Taping;Vasopneumatic Device;Dry needling    PT Next Visit Plan will add order for L knee strengthening, assess response to HEP; progress core and proximal hip strengthening and strengthening of R QL    PT Home Exercise Plan Access Code: [email or web address]    Consulted and Agree with Plan of Care Patient             Patient will benefit from skilled therapeutic intervention in order to improve the following deficits and impairments:  Improper body mechanics, Increased muscle spasms, Pain, Decreased activity tolerance, Postural dysfunction, Difficulty walking, Abnormal gait, Decreased strength, Decreased endurance, Increased edema, Decreased balance  Visit Diagnosis: Chronic right-sided low back pain without sciatica  Muscle weakness (generalized)     Problem List Patient Active Problem List   Diagnosis Date Noted   Pain in left elbow 07/27/2020   Prediabetes  02/03/2019   Obstructive sleep apnea 04/23/2018   Other fatigue 11/26/2017   Shortness of breath on exertion 11/26/2017   Type 2 diabetes mellitus without complication, without long-term current use of insulin ([place]) 11/26/2017  Other hyperlipidemia 11/26/2017   Vitamin D deficiency 11/26/2017   Schizoaffective disorder, bipolar type ([place]) 09/14/2014   Intellectual disability 09/10/2014    Ward Chatters, PT 08/03/2021, 5:54 PM  [facility] [address] [place], [place], [address] Phone: [phone]   Fax:  [phone]  Name: Cindy Phillips MRN: [id number] Date of Birth: 1991/08/14

## 2021-08-03 NOTE — Addendum Note (Signed)
Addended by: Ward Chatters on: 08/03/2021 06:08 PM   Modules accepted: Orders

## 2021-08-03 NOTE — Addendum Note (Signed)
Addended by: Ward Chatters on: 08/03/2021 06:07 PM   Modules accepted: Orders

## 2021-08-07 DIAGNOSIS — E1169 Type 2 diabetes mellitus with other specified complication: Secondary | ICD-10-CM | POA: Diagnosis not present

## 2021-08-07 DIAGNOSIS — M25562 Pain in left knee: Secondary | ICD-10-CM | POA: Diagnosis not present

## 2021-08-07 DIAGNOSIS — G4733 Obstructive sleep apnea (adult) (pediatric): Secondary | ICD-10-CM | POA: Diagnosis not present

## 2021-08-07 DIAGNOSIS — Z9989 Dependence on other enabling machines and devices: Secondary | ICD-10-CM | POA: Diagnosis not present

## 2021-08-07 DIAGNOSIS — E785 Hyperlipidemia, unspecified: Secondary | ICD-10-CM | POA: Diagnosis not present

## 2021-08-08 DIAGNOSIS — S83282D Other tear of lateral meniscus, current injury, left knee, subsequent encounter: Secondary | ICD-10-CM | POA: Diagnosis not present

## 2021-08-16 DIAGNOSIS — G518 Other disorders of facial nerve: Secondary | ICD-10-CM | POA: Diagnosis not present

## 2021-08-16 DIAGNOSIS — M542 Cervicalgia: Secondary | ICD-10-CM | POA: Diagnosis not present

## 2021-08-16 DIAGNOSIS — G43719 Chronic migraine without aura, intractable, without status migrainosus: Secondary | ICD-10-CM | POA: Diagnosis not present

## 2021-08-16 DIAGNOSIS — M791 Myalgia, unspecified site: Secondary | ICD-10-CM | POA: Diagnosis not present

## 2021-08-18 DIAGNOSIS — E785 Hyperlipidemia, unspecified: Secondary | ICD-10-CM | POA: Diagnosis not present

## 2021-08-18 DIAGNOSIS — E1165 Type 2 diabetes mellitus with hyperglycemia: Secondary | ICD-10-CM | POA: Diagnosis not present

## 2021-08-18 NOTE — Progress Notes (Signed)
30 year old white female returns with complaints of low back pain.  She has gone to physical therapy but now does not feel like her pain is getting any better.  She actually thinks that it may have gotten worse.  I have previously discussed with her that if she did not have any improvement by the next that would be lumbar MRI and she is wanting to proceed with getting that.  Exam Pleasant black female alert and oriented in no acute distress.  Gait is somewhat antalgic.  Neurologically intact.  Plan Since patient has failed conservative treatment of this point I recommend getting lumbar MRI to rule out HNP/stenosis.  She will follow-up with Dr. Louanne Skye in 3 weeks for recheck and to review results and he will discuss treatment options with her at that point.  All questions answered.

## 2021-08-22 ENCOUNTER — Other Ambulatory Visit: Payer: Self-pay

## 2021-08-22 ENCOUNTER — Ambulatory Visit: Payer: Medicare Other | Attending: Surgery

## 2021-08-22 DIAGNOSIS — M25562 Pain in left knee: Secondary | ICD-10-CM | POA: Diagnosis not present

## 2021-08-22 DIAGNOSIS — M545 Low back pain, unspecified: Secondary | ICD-10-CM | POA: Insufficient documentation

## 2021-08-22 DIAGNOSIS — M6281 Muscle weakness (generalized): Secondary | ICD-10-CM | POA: Diagnosis not present

## 2021-08-22 DIAGNOSIS — G8929 Other chronic pain: Secondary | ICD-10-CM | POA: Insufficient documentation

## 2021-08-22 NOTE — Therapy (Signed)
[place], [place], [address] Phone: [phone]   Fax:  [phone]  Physical Therapy Treatment  Patient Details  Name: TIMICA MARCOM MRN: [id number] Date of Birth: 1990/10/03 Referring Provider (PT): Lucianne Lei, MD   Encounter Date: 08/22/2021   PT End of Session - 08/22/21 1443     Visit Number 11    Number of Visits 17    Date for PT Re-Evaluation 09/27/21    Authorization Type UHC MCR    PT Start Time 7062    PT Stop Time 1524    PT Time Calculation (min) 39 min    Activity Tolerance Patient tolerated treatment well    Behavior During Therapy WFL for tasks assessed/performed             Past Medical History:  Diagnosis Date   Bipolar 1 disorder ([place])    Bowel incontinence    Depression    Diabetes mellitus without complication ([place])    Mental disability     Past Surgical History:  Procedure Laterality Date   FOOT SURGERY     KNEE ARTHROSCOPY W/ ACL RECONSTRUCTION Left 10/12/2020    There were no vitals filed for this visit.   Subjective Assessment - 08/22/21 1443     Subjective Pt presents to PT with continued reports of LBP. She has MRI scheduled on 08/29/21, has been compliant with HEP with no adverse effect. Notes that HEP helps decrease her pain.    Currently in Pain? Yes    Pain Score 5     Pain Location Back    Pain Orientation Lower           [facility] Adult PT Treatment/Exercise:   Therapeutic Exercise:  NuStep lvl 6 x 5 min while taking subjective Standing lumbar extension x 10 POE x 60" Prone press up x 10 Bridge 2x10 Supine SLR L 2x10 STS 2x15 - L foot back Total gym knee ext 3x10 10lbs Lateral walk RTB 3x19ft Single leg press 2x10 L 55lb                                PT Short Term Goals - 07/06/21 1911       PT SHORT TERM GOAL #1   Title Pt will be compliant and knowledgeable with initial HEP for improved carryover     Baseline initial HEP given    Time 3    Period Weeks    Status Achieved    Target Date 06/28/21               PT Long Term Goals - 08/02/21 1843       PT LONG TERM GOAL #1   Title Pt will improve FOTO to no less than 73% function as proxy for functional improvement    Baseline 50% function; 60% function on 12/14    Time 8    Period Weeks    Status On-going    Target Date 09/27/21      PT LONG TERM GOAL #2   Title Pt will increase 30 Sec STS to no less than 10 reps for improved comfort and functional mobility    Baseline 8 reps; 12 reps on 12/14    Time 8    Period Weeks    Status Achieved    Target Date --      PT LONG TERM GOAL #3   Title Pt  will self report LBP no greater than 3/10 at worst for improved comfort and function    Baseline 8/10 at worst    Time 8    Period Weeks    Status On-going    Target Date 09/27/21      PT LONG TERM GOAL #4   Title Pt will be knowledgeable with advanced HEP for continued improvement post d/c from Pt    Time 8    Period Weeks    Status Achieved    Target Date --                   Plan - 08/22/21 1457     Clinical Impression Statement Pt was able to complete all prescribed exercises with no adverse effect or increase in pain. Therapy today focused on extension based exercises along with core and LE strengthening, with emphasis on L quad. She continues to benefit from skilled PT services, working on improving core and proximal hip strength in order to decrease pain and improve functional mobility. Pt will continue to progress as tolerated per POC.    PT Treatment/Interventions ADLs/Self Care Home Management;Cryotherapy;Electrical Stimulation;Iontophoresis 4mg /ml Dexamethasone;Moist Heat;Ultrasound;Gait training;Stair training;Functional mobility training;Therapeutic activities;Therapeutic exercise;Balance training;Neuromuscular re-education;Patient/family education;Manual techniques;Passive range of  motion;Taping;Vasopneumatic Device;Dry needling    PT Next Visit Plan assess response to HEP; progress core and proximal hip strengthening             Patient will benefit from skilled therapeutic intervention in order to improve the following deficits and impairments:  Improper body mechanics, Increased muscle spasms, Pain, Decreased activity tolerance, Postural dysfunction, Difficulty walking, Abnormal gait, Decreased strength, Decreased endurance, Increased edema, Decreased balance  Visit Diagnosis: Chronic right-sided low back pain without sciatica  Muscle weakness (generalized)     Problem List Patient Active Problem List   Diagnosis Date Noted   Pain in left elbow 07/27/2020   Prediabetes 02/03/2019   Obstructive sleep apnea 04/23/2018   Other fatigue 11/26/2017   Shortness of breath on exertion 11/26/2017   Type 2 diabetes mellitus without complication, without long-term current use of insulin ([place]) 11/26/2017   Other hyperlipidemia 11/26/2017   Vitamin D deficiency 11/26/2017   Schizoaffective disorder, bipolar type ([place]) 09/14/2014   Intellectual disability 09/10/2014    Ward Chatters, PT 08/22/2021, 3:24 PM  [facility] [address] [place], [place], [address] Phone: [phone]   Fax:  [phone]  Name: MINTA FAIR MRN: [id number] Date of Birth: 01/09/1991

## 2021-08-24 ENCOUNTER — Ambulatory Visit: Payer: Medicare Other | Admitting: Surgery

## 2021-08-25 ENCOUNTER — Other Ambulatory Visit: Payer: Self-pay

## 2021-08-25 ENCOUNTER — Ambulatory Visit: Payer: Medicare Other | Admitting: Physical Therapy

## 2021-08-25 ENCOUNTER — Encounter: Payer: Self-pay | Admitting: Physical Therapy

## 2021-08-25 DIAGNOSIS — M545 Low back pain, unspecified: Secondary | ICD-10-CM | POA: Diagnosis not present

## 2021-08-25 DIAGNOSIS — M6281 Muscle weakness (generalized): Secondary | ICD-10-CM

## 2021-08-25 DIAGNOSIS — G8929 Other chronic pain: Secondary | ICD-10-CM

## 2021-08-25 DIAGNOSIS — M25562 Pain in left knee: Secondary | ICD-10-CM | POA: Diagnosis not present

## 2021-08-25 NOTE — Therapy (Signed)
Cindy Phillips, [place], [address] Phone: [phone]   Fax:  [phone]  Physical Therapy Treatment  Patient Details  Name: Cindy Phillips MRN: [id number] Date of Birth: March 30, 1991 Referring Provider (PT): Lucianne Lei, MD   Encounter Date: 08/25/2021   PT End of Session - 08/25/21 1259     Visit Number 12    Number of Visits 17    Date for PT Re-Evaluation 09/27/21    Authorization Type UHC MCR    PT Start Time 1301    PT Stop Time 2706    PT Time Calculation (min) 41 min    Activity Tolerance Patient tolerated treatment well    Behavior During Therapy WFL for tasks assessed/performed             Past Medical History:  Diagnosis Date   Bipolar 1 disorder ([place])    Bowel incontinence    Depression    Diabetes mellitus without complication ([place])    Mental disability     Past Surgical History:  Procedure Laterality Date   FOOT SURGERY     KNEE ARTHROSCOPY W/ ACL RECONSTRUCTION Left 10/12/2020    There were no vitals filed for this visit.   Subjective Assessment - 08/25/21 1304     Subjective Pt reports that her back is doing well today she rates her back pain @ 5/10.  Her L knee hurts @ 8/10.  She feels like she is slowly getting better.              [facility] Adult PT Treatment/Exercise:   Therapeutic Exercise:  NuStep lvl 6 x 5 min while taking subjective Standing lumbar extension 2 x 15 POE x 60" (NT) Isometric abdominal squeeze with P ball x10 Prone press up x 10 Bridge 2x10 Supine SLR L 2x15 STS 2x15 - L foot back Total gym S/L knee ext 3x10 20lbs Lateral walk RTB 3x76ft at toes Single leg press 2x10 L 55lb    PT Short Term Goals - 07/06/21 1911       PT SHORT TERM GOAL #1   Title Pt will be compliant and knowledgeable with initial HEP for improved carryover    Baseline initial HEP given    Time 3    Period Weeks    Status Achieved    Target Date 06/28/21                PT Long Term Goals - 08/02/21 1843       PT LONG TERM GOAL #1   Title Pt will improve FOTO to no less than 73% function as proxy for functional improvement    Baseline 50% function; 60% function on 12/14    Time 8    Period Weeks    Status On-going    Target Date 09/27/21      PT LONG TERM GOAL #2   Title Pt will increase 30 Sec STS to no less than 10 reps for improved comfort and functional mobility    Baseline 8 reps; 12 reps on 12/14    Time 8    Period Weeks    Status Achieved    Target Date --      PT LONG TERM GOAL #3   Title Pt will self report LBP no greater than 3/10 at worst for improved comfort and function    Baseline 8/10 at worst    Time 8    Period Weeks    Status On-going  Target Date 09/27/21      PT LONG TERM GOAL #4   Title Pt will be knowledgeable with advanced HEP for continued improvement post d/c from Pt    Time 8    Period Weeks    Status Achieved    Target Date --                   Plan - 08/25/21 1335     Clinical Impression [place] is progressing well with therapy.  Pt reports no increase in baseline pain following therapy.  Today we concentrated on core strengthening, knee strengthening, and hip strengthening.  Pt with reports of increased L knee pain today which decreases with therex.  Significant deficits in hip, LE, and core strength remain which should be addressed.  Pt will continue to benefit from skilled physical therapy to address remaining deficits and achieve listed goals.  Continue per POC.             Patient will benefit from skilled therapeutic intervention in order to improve the following deficits and impairments:     Visit Diagnosis: Chronic right-sided low back pain without sciatica  Muscle weakness (generalized)  Chronic pain of left knee     Problem List Patient Active Problem List   Diagnosis Date Noted   Pain in left elbow 07/27/2020   Prediabetes 02/03/2019    Obstructive sleep apnea 04/23/2018   Other fatigue 11/26/2017   Shortness of breath on exertion 11/26/2017   Type 2 diabetes mellitus without complication, without long-term current use of insulin ([place]) 11/26/2017   Other hyperlipidemia 11/26/2017   Vitamin D deficiency 11/26/2017   Schizoaffective disorder, bipolar type ([place]) 09/14/2014   Intellectual disability 09/10/2014    Mathis Dad, PT 08/25/2021, 1:36 PM  [facility] [address] [place], [place], [address] Phone: [phone]   Fax:  [phone]  Name: Cindy Phillips MRN: [id number] Date of Birth: 30-Aug-1990

## 2021-08-29 ENCOUNTER — Ambulatory Visit
Admission: RE | Admit: 2021-08-29 | Discharge: 2021-08-29 | Disposition: A | Discharge status: Home / Self Care | Payer: Commercial Managed Care - HMO | Source: Ambulatory Visit | Attending: Surgery | Admitting: Surgery

## 2021-08-29 ENCOUNTER — Ambulatory Visit: Payer: Medicare Other

## 2021-08-29 ENCOUNTER — Other Ambulatory Visit: Payer: Self-pay

## 2021-08-29 DIAGNOSIS — M6281 Muscle weakness (generalized): Secondary | ICD-10-CM | POA: Diagnosis not present

## 2021-08-29 DIAGNOSIS — M545 Low back pain, unspecified: Secondary | ICD-10-CM

## 2021-08-29 DIAGNOSIS — G8929 Other chronic pain: Secondary | ICD-10-CM | POA: Diagnosis not present

## 2021-08-29 DIAGNOSIS — M25562 Pain in left knee: Secondary | ICD-10-CM | POA: Diagnosis not present

## 2021-08-29 DIAGNOSIS — M5416 Radiculopathy, lumbar region: Secondary | ICD-10-CM

## 2021-08-29 NOTE — Therapy (Signed)
Cindy Phillips, [place], [address] Phone: [phone]   Fax:  [phone]  Physical Therapy Treatment  Patient Details  Name: Cindy Phillips MRN: [id number] Date of Birth: 1990/11/16 Referring Provider (PT): Lucianne Lei, MD   Encounter Date: 08/29/2021   PT End of Session - 08/29/21 1342     Visit Number 13    Number of Visits 17    Date for PT Re-Evaluation 09/27/21    Authorization Type UHC MCR    PT Start Time 5573    PT Stop Time 1437    PT Time Calculation (min) 42 min    Activity Tolerance Patient tolerated treatment well    Behavior During Therapy WFL for tasks assessed/performed             Past Medical History:  Diagnosis Date   Bipolar 1 disorder ([place])    Bowel incontinence    Depression    Diabetes mellitus without complication ([place])    Mental disability     Past Surgical History:  Procedure Laterality Date   FOOT SURGERY     KNEE ARTHROSCOPY W/ ACL RECONSTRUCTION Left 10/12/2020    There were no vitals filed for this visit.   Subjective Assessment - 08/29/21 1347     Subjective Pt presents to PT with reports of decreased LBP. Pt has been compliant with HEP with no adverse effect. Pt is ready to begin PT treatment at this time.    Currently in Pain? Yes    Pain Score 2     Pain Location Back    Pain Orientation Lower           [facility] Adult PT Treatment/Exercise:   Therapeutic Exercise:  NuStep lvl 7 x 5 min while taking subjective Standing lumbar extension x 15 POE x 60" (NT) Isometric abdominal squeeze with P ball 2x10 - 3" Prone press up x 10 Prone bird dog x 10  Bridge 2x10 Supine SLR L 2x15 3# STS 2x15 - L foot back Total gym S/L knee ext 3x10 20lbs Lateral walk RTB 3x11ft at toes Single leg press 3x10 L 60lb                               PT Short Term Goals - 07/06/21 1911       PT SHORT TERM GOAL #1   Title Pt will be  compliant and knowledgeable with initial HEP for improved carryover    Baseline initial HEP given    Time 3    Period Weeks    Status Achieved    Target Date 06/28/21               PT Long Term Goals - 08/02/21 1843       PT LONG TERM GOAL #1   Title Pt will improve FOTO to no less than 73% function as proxy for functional improvement    Baseline 50% function; 60% function on 12/14    Time 8    Period Weeks    Status On-going    Target Date 09/27/21      PT LONG TERM GOAL #2   Title Pt will increase 30 Sec STS to no less than 10 reps for improved comfort and functional mobility    Baseline 8 reps; 12 reps on 12/14    Time 8    Period Weeks    Status Achieved  Target Date --      PT LONG TERM GOAL #3   Title Pt will self report LBP no greater than 3/10 at worst for improved comfort and function    Baseline 8/10 at worst    Time 8    Period Weeks    Status On-going    Target Date 09/27/21      PT LONG TERM GOAL #4   Title Pt will be knowledgeable with advanced HEP for continued improvement post d/c from Pt    Time 8    Period Weeks    Status Achieved    Target Date --                   Plan - 08/29/21 1427     Clinical Impression Statement Pt was able to complete all prescribed exercises with no adverse effect or increase in pain. Therapy today focused on improving core and proximal hip strength, as well as L quad strength and functional mobility. She is progressing well in recent sessions and will continue to be seen and progressed as tolerated per POC.    PT Treatment/Interventions ADLs/Self Care Home Management;Cryotherapy;Electrical Stimulation;Iontophoresis 4mg /ml Dexamethasone;Moist Heat;Ultrasound;Gait training;Stair training;Functional mobility training;Therapeutic activities;Therapeutic exercise;Balance training;Neuromuscular re-education;Patient/family education;Manual techniques;Passive range of motion;Taping;Vasopneumatic Device;Dry needling     PT Next Visit Plan assess response to HEP; progress core and proximal hip strengthening    PT Home Exercise Plan Access Code: [email or web address]             Patient will benefit from skilled therapeutic intervention in order to improve the following deficits and impairments:  Improper body mechanics, Increased muscle spasms, Pain, Decreased activity tolerance, Postural dysfunction, Difficulty walking, Abnormal gait, Decreased strength, Decreased endurance, Increased edema, Decreased balance  Visit Diagnosis: Chronic right-sided low back pain without sciatica  Muscle weakness (generalized)     Problem List Patient Active Problem List   Diagnosis Date Noted   Pain in left elbow 07/27/2020   Prediabetes 02/03/2019   Obstructive sleep apnea 04/23/2018   Other fatigue 11/26/2017   Shortness of breath on exertion 11/26/2017   Type 2 diabetes mellitus without complication, without long-term current use of insulin ([place]) 11/26/2017   Other hyperlipidemia 11/26/2017   Vitamin D deficiency 11/26/2017   Schizoaffective disorder, bipolar type ([place]) 09/14/2014   Intellectual disability 09/10/2014    Ward Chatters, PT 08/29/2021, 2:40 PM  [facility] [address] [place], [place], [address] Phone: [phone]   Fax:  [phone]  Name: Cindy Phillips MRN: [id number] Date of Birth: [birth year]

## 2021-08-30 ENCOUNTER — Ambulatory Visit: Payer: Medicare Other

## 2021-08-30 ENCOUNTER — Telehealth: Payer: Self-pay | Admitting: Surgery

## 2021-08-30 DIAGNOSIS — M545 Low back pain, unspecified: Secondary | ICD-10-CM | POA: Diagnosis not present

## 2021-08-30 DIAGNOSIS — G8929 Other chronic pain: Secondary | ICD-10-CM | POA: Diagnosis not present

## 2021-08-30 DIAGNOSIS — M6281 Muscle weakness (generalized): Secondary | ICD-10-CM

## 2021-08-30 DIAGNOSIS — M25562 Pain in left knee: Secondary | ICD-10-CM | POA: Diagnosis not present

## 2021-08-30 NOTE — Telephone Encounter (Signed)
Patient called advised she could not have the MRI because she is claustrophobic. Patient asked if she can get something called into her pharmacy before she take the MRI?   Patient uses the [facility] on [address], The number to contact patient is [phone]

## 2021-08-30 NOTE — Telephone Encounter (Signed)
Please advise 

## 2021-08-30 NOTE — Therapy (Signed)
[place], [place], [address] Phone: [phone]   Fax:  [phone]  Physical Therapy Treatment  Patient Details  Name: Cindy Phillips MRN: [id number] Date of Birth: 1991/07/15 Referring Provider (PT): Lucianne Lei, MD   Encounter Date: 08/30/2021   PT End of Session - 08/30/21 1334     Visit Number 14    Number of Visits 17    Date for PT Re-Evaluation 09/27/21    Authorization Type UHC MCR    PT Start Time 1340    PT Stop Time 8115    PT Time Calculation (min) 40 min    Activity Tolerance Patient tolerated treatment well    Behavior During Therapy WFL for tasks assessed/performed             Past Medical History:  Diagnosis Date   Bipolar 1 disorder ([place])    Bowel incontinence    Depression    Diabetes mellitus without complication ([place]indy Phillips)    Mental disability     Past Surgical History:  Procedure Laterality Date   FOOT SURGERY     KNEE ARTHROSCOPY W/ ACL RECONSTRUCTION Left 10/12/2020    There were no vitals filed for this visit.   Subjective Assessment - 08/30/21 1341     Subjective Pt presents to PT with no current reports of pain. Was getting her MRI yesterday and had to stop after having increasing her pain and feel claustrophobic. She is ready to begin PT treatment at this time.    Currently in Pain? No/denies    Pain Score 0-No pain           [facility] Adult PT Treatment/Exercise:   Therapeutic Exercise:  NuStep lvl 7 x 5 min while taking subjective Standing lumbar extension x 15 Isometric abdominal squeeze with P ball 2x10 - 3" Prone press up x 10 Prone bird dog x 10  STS 2x10 15lb overhead press Total gym S/L knee ext 2x10 15lbs Lateral walk RTB 3x64ft at toes Single leg press 3x10 L 60lb Standing hip ext 2x10 42.5lb  Not Today: Bridge 2x10 Supine SLR L 2x15 3#                              PT Short Term Goals - 07/06/21 1911        PT SHORT TERM GOAL #1   Title Pt will be compliant and knowledgeable with initial HEP for improved carryover    Baseline initial HEP given    Time 3    Period Weeks    Status Achieved    Target Date 06/28/21               PT Long Term Goals - 08/02/21 1843       PT LONG TERM GOAL #1   Title Pt will improve FOTO to no less than 73% function as proxy for functional improvement    Baseline 50% function; 60% function on 12/14    Time 8    Period Weeks    Status On-going    Target Date 09/27/21      PT LONG TERM GOAL #2   Title Pt will increase 30 Sec STS to no less than 10 reps for improved comfort and functional mobility    Baseline 8 reps; 12 reps on 12/14    Time 8    Period Weeks    Status Achieved    Target Date --  PT LONG TERM GOAL #3   Title Pt will self report LBP no greater than 3/10 at worst for improved comfort and function    Baseline 8/10 at worst    Time 8    Period Weeks    Status On-going    Target Date 09/27/21      PT LONG TERM GOAL #4   Title Pt will be knowledgeable with advanced HEP for continued improvement post d/c from Pt    Time 8    Period Weeks    Status Achieved    Target Date --                   Plan - 08/30/21 1355     Clinical Impression Statement Pt was able to complete all prescribed exercises, did have to drop intensity of some exercises due to fatigue after being seen the previous day as well. She had no lower back pain today and is continuing to progress well. Pt continues to show preference for extension-based stretching as well. Will continue to progress as able per POC.    PT Treatment/Interventions ADLs/Self Care Home Management;Cryotherapy;Electrical Stimulation;Iontophoresis 4mg /ml Dexamethasone;Moist Heat;Ultrasound;Gait training;Stair training;Functional mobility training;Therapeutic activities;Therapeutic exercise;Balance training;Neuromuscular re-education;Patient/family education;Manual techniques;Passive  range of motion;Taping;Vasopneumatic Device;Dry needling    PT Next Visit Plan assess response to HEP; progress core and proximal hip strengthening    PT Home Exercise Plan Access Code: [email or web address]             Patient will benefit from skilled therapeutic intervention in order to improve the following deficits and impairments:  Improper body mechanics, Increased muscle spasms, Pain, Decreased activity tolerance, Postural dysfunction, Difficulty walking, Abnormal gait, Decreased strength, Decreased endurance, Increased edema, Decreased balance  Visit Diagnosis: Chronic right-sided low back pain without sciatica  Muscle weakness (generalized)     Problem List Patient Active Problem List   Diagnosis Date Noted   Pain in left elbow 07/27/2020   Prediabetes 02/03/2019   Obstructive sleep apnea 04/23/2018   Other fatigue 11/26/2017   Shortness of breath on exertion 11/26/2017   Type 2 diabetes mellitus without complication, without long-term current use of insulin ([place]) 11/26/2017   Other hyperlipidemia 11/26/2017   Vitamin D deficiency 11/26/2017   Schizoaffective disorder, bipolar type ([place]indy [place]) 09/14/2014   Intellectual disability 09/10/2014    Ward Chatters, PT 08/30/2021, 2:26 PM  [facility] [address] [place], [place], [address] Phone: [phone]   Fax:  [phone]  Name: Cindy Phillips MRN: [id number] Date of Birth: 1990-12-05

## 2021-08-31 ENCOUNTER — Ambulatory Visit: Payer: Commercial Managed Care - HMO | Admitting: Surgery

## 2021-08-31 MED ORDER — DIAZEPAM 5 MG PO TABS: ORAL_TABLET | ORAL | 0 refills | Status: DC

## 2021-08-31 NOTE — Telephone Encounter (Signed)
Called to pharmacy. Called patient, no answer, no voicemail picked up. Please advise if she returns call.

## 2021-09-05 ENCOUNTER — Ambulatory Visit: Payer: Medicare Other

## 2021-09-06 ENCOUNTER — Ambulatory Visit: Payer: Medicare Other

## 2021-09-06 ENCOUNTER — Telehealth: Payer: Self-pay | Admitting: Orthopaedic Surgery

## 2021-09-06 MED ORDER — DIAZEPAM 5 MG PO TABS: ORAL_TABLET | ORAL | 0 refills | Status: DC

## 2021-09-06 NOTE — Telephone Encounter (Signed)
FYI  Valium 5mg  #2 was called in on 08/31/2021. I called pharmacy and they state that they never received the rx. I gave information again over the phone to pharmacist and she will get ready for patient now. I called patient and advised.

## 2021-09-06 NOTE — Telephone Encounter (Signed)
Ptcalled stating she's supposed to have an MRI on 09/10/21 but she's claustrophobic. Pt would like to know if she could have something called in to help? She would like a CB to be updated on Dr.Yates decision.   [phone]

## 2021-09-10 ENCOUNTER — Other Ambulatory Visit: Payer: Medicare Other

## 2021-09-10 ENCOUNTER — Other Ambulatory Visit: Payer: Self-pay

## 2021-09-10 ENCOUNTER — Ambulatory Visit
Admission: RE | Admit: 2021-09-10 | Discharge: 2021-09-10 | Disposition: A | Discharge status: Home / Self Care | Payer: Medicare Other | Source: Ambulatory Visit | Attending: Surgery | Admitting: Surgery

## 2021-09-10 DIAGNOSIS — M5127 Other intervertebral disc displacement, lumbosacral region: Secondary | ICD-10-CM | POA: Diagnosis not present

## 2021-09-10 IMAGING — MR MR LUMBAR SPINE W/O CM
4 of 5 series · 26 of 48 positions shown · non-contrast
Comparison: Prior radiograph from [DATE].

CLINICAL DATA: Initial evaluation for low back pain with bilateral
lower extremity radiculopathy.

EXAM:
MRI LUMBAR SPINE WITHOUT CONTRAST
TECHNIQUE: Multiplanar, multisequence MR imaging of the lumbar spine was
performed. No intravenous contrast was administered.

[Series 3: T2 · sagittal · 4.0mm · 0.53mm/px · 6 of 15 slices shown (1 of 2)]
[im 1/15]
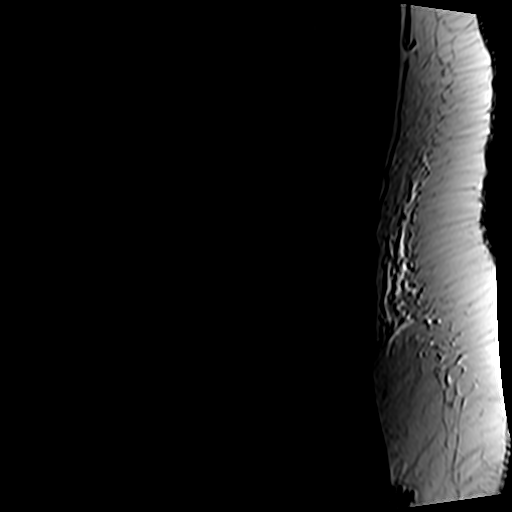
[im 3/15]
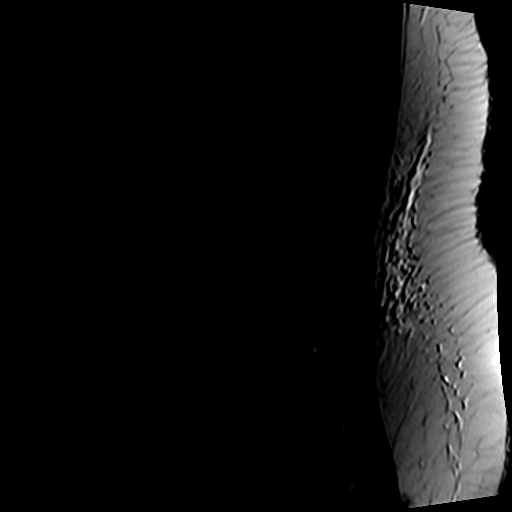
[im 6/15]
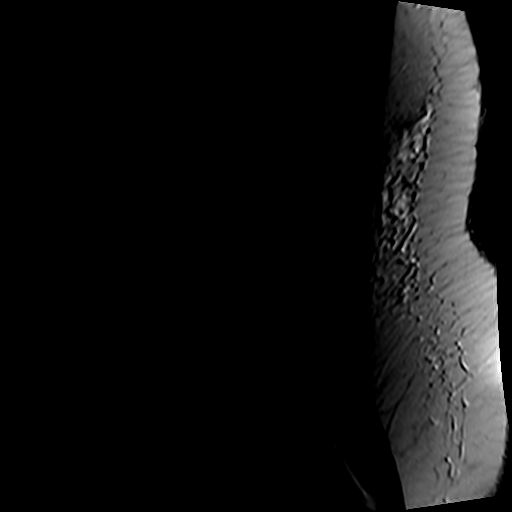
[im 9/15]
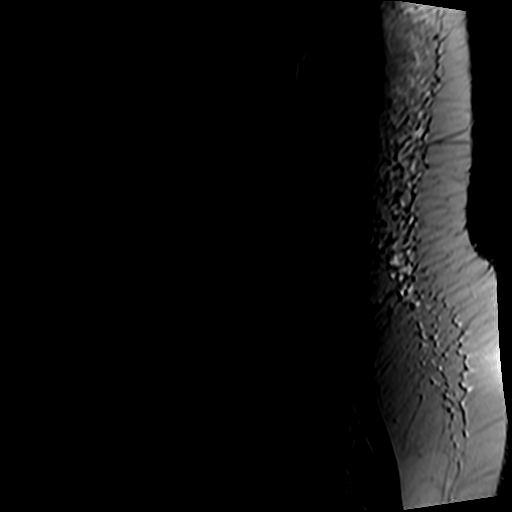
[im 12/15]
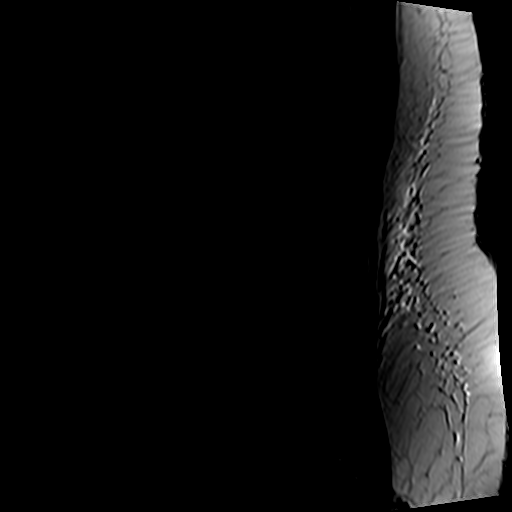
[im 15/15]
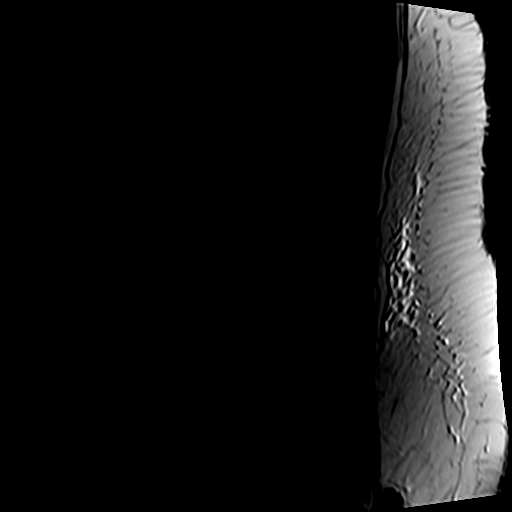

[Series 5: T1 · sagittal · 4.0mm · 0.53mm/px · 6 of 15 slices shown (1 of 2)]
[im 1/15]
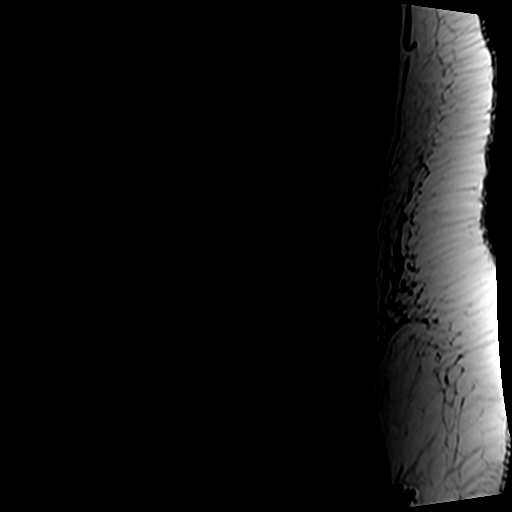
[im 3/15]
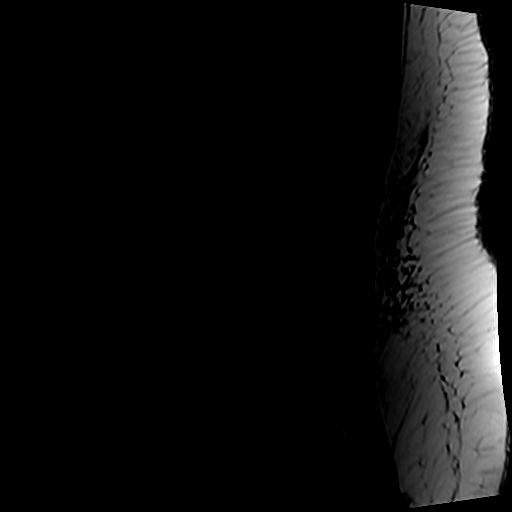
[im 6/15]
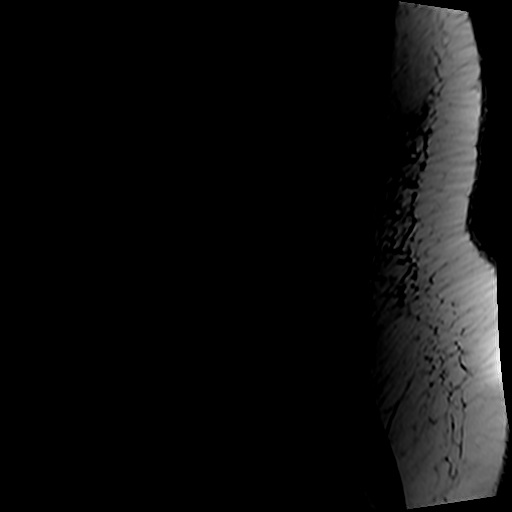
[im 9/15]
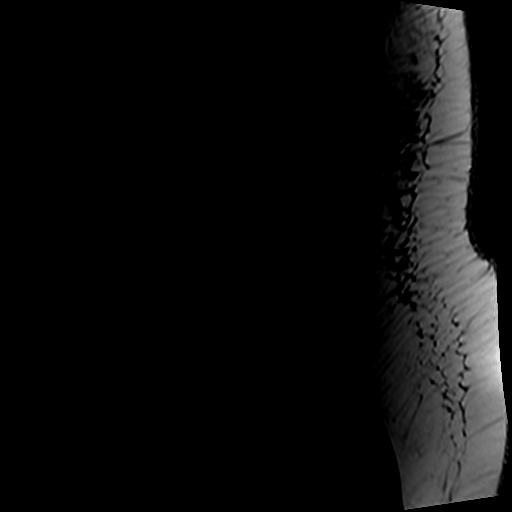
[im 12/15]
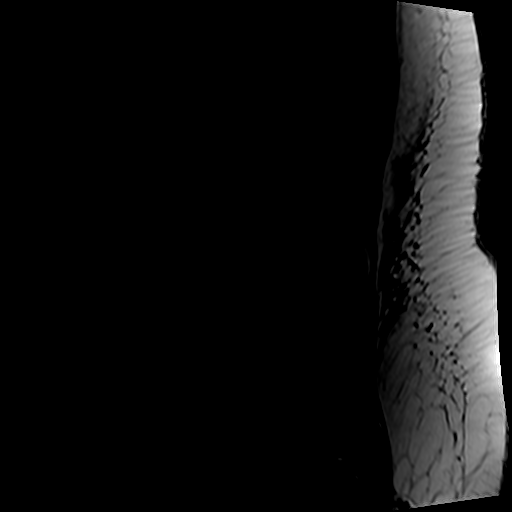
[im 15/15]
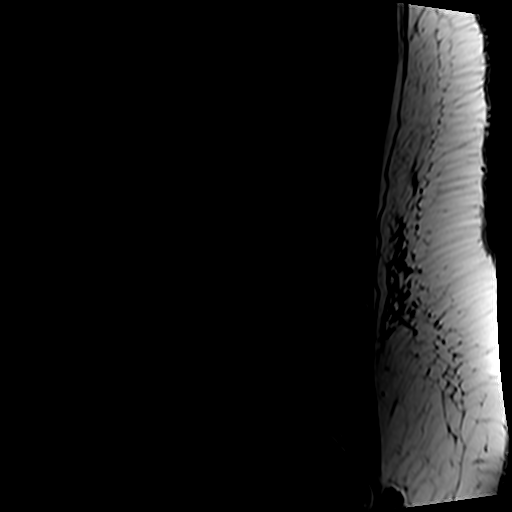

[Series 6: T2 · axial · 4.0mm · 0.70mm/px · z∈[-117,+111]mm · 9 of 40 slices shown (2 of 2)]
[im 1/40]
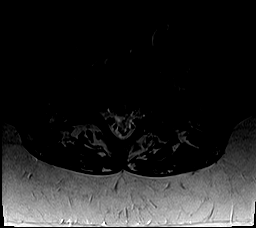
[im 6/40]
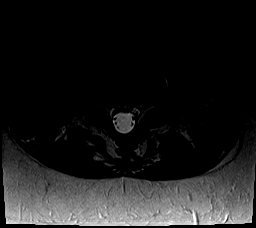
[im 12/40]
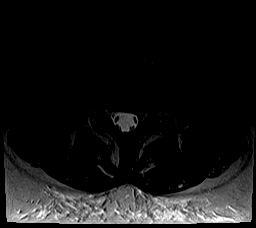
[im 17/40]
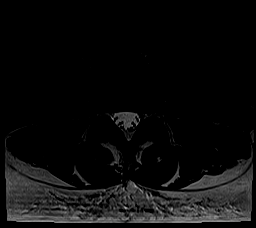
[im 20/40]
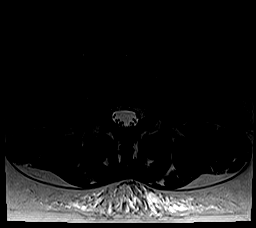
[im 23/40]
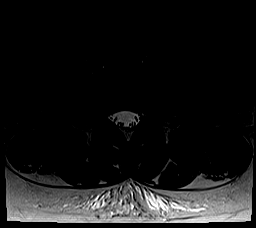
[im 28/40]
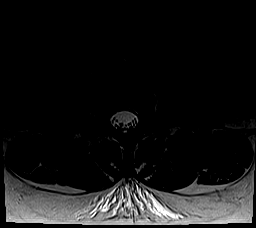
[im 34/40]
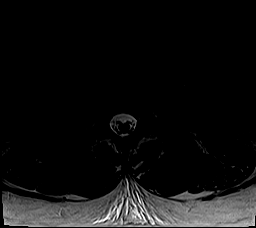
[im 40/40]
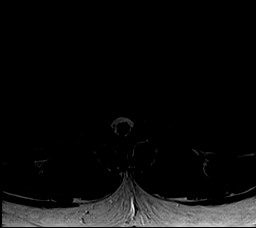

[Series 7: T1 · axial · 4.0mm · 0.35mm/px · z∈[-117,+80]mm · 5 of 40 slices shown (2 of 2)]
[im 1/40]
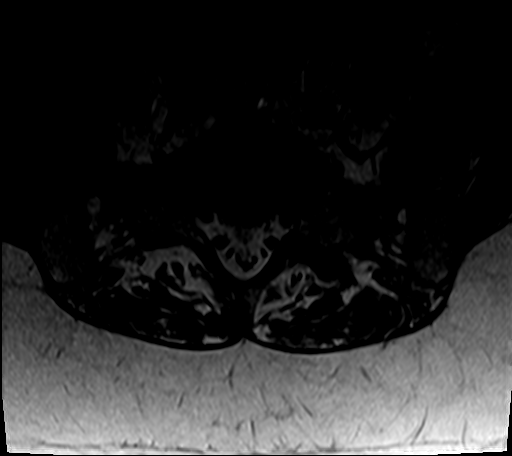
[im 6/40]
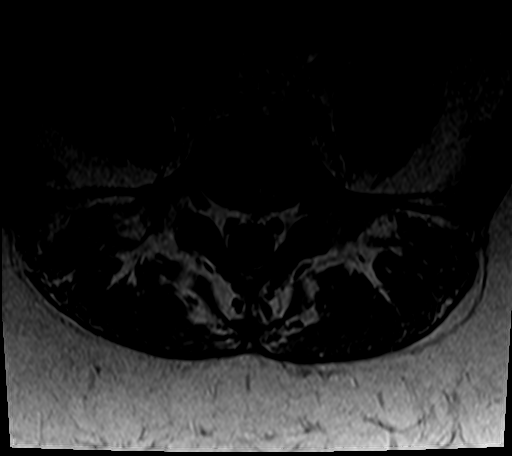
[im 12/40]
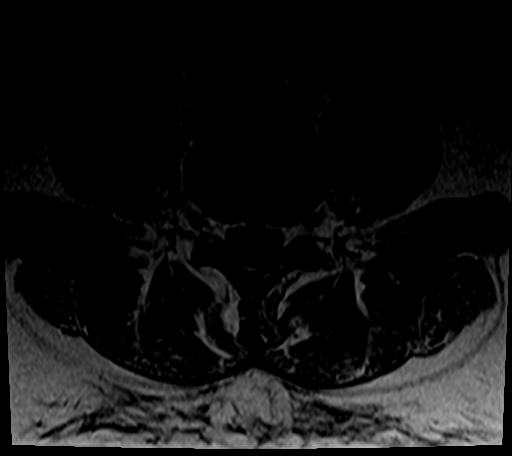
[im 20/40]
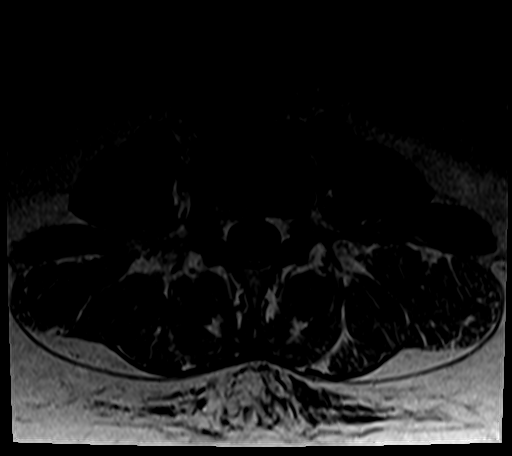
[im 34/40]
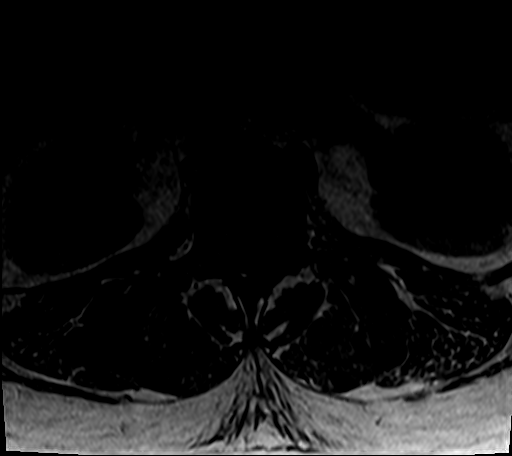

[26 of 48 positions shown; findings below may reference images not displayed]

FINDINGS: Segmentation: Standard. Lowest well-formed disc space labeled the
L5-S1 level.

Alignment: Physiologic with preservation of the normal lumbar
lordosis. No listhesis.

Vertebrae: Vertebral body height maintained without acute or chronic
fracture. Bone marrow signal intensity within normal limits. No
discrete or worrisome osseous lesions or abnormal marrow edema.

Conus medullaris and cauda equina: Conus extends to the T12-L1
level. Conus and cauda equina appear normal.

Paraspinal and other soft tissues: Unremarkable.

Disc levels:

Lumbar spine is image from the T11-12 level inferiorly. No
significant findings are seen through the L3-4 level.

L4-5: Normal interspace. Mild bilateral facet hypertrophy. No
stenosis or impingement.

L5-S1: Disc desiccation. Superimposed small central disc protrusion
with slight inferior migration (series 5, image 37). Disc material
closely approximates the descending S1 nerve roots without frank
neural impingement or displacement, slightly greater on the right.
Mild left-sided facet arthrosis. No significant spinal stenosis.
Foramina remain patent.
IMPRESSION: 1. Small central disc protrusion at L5-S1, closely approximating the
descending S1 nerve roots without frank neural impingement or
displacement, slightly greater on the right.
2. Mild bilateral facet hypertrophy at L4-5 and L5-S1.

## 2021-09-12 ENCOUNTER — Ambulatory Visit: Payer: Medicare Other

## 2021-09-12 ENCOUNTER — Other Ambulatory Visit: Payer: Self-pay

## 2021-09-12 DIAGNOSIS — G8929 Other chronic pain: Secondary | ICD-10-CM

## 2021-09-12 DIAGNOSIS — M545 Low back pain, unspecified: Secondary | ICD-10-CM | POA: Diagnosis not present

## 2021-09-12 DIAGNOSIS — M25562 Pain in left knee: Secondary | ICD-10-CM | POA: Diagnosis not present

## 2021-09-12 DIAGNOSIS — M6281 Muscle weakness (generalized): Secondary | ICD-10-CM | POA: Diagnosis not present

## 2021-09-12 NOTE — Therapy (Addendum)
[place], [place], [address] Phone: [phone]   Fax:  [phone]  Physical Therapy Treatment/Discharge  Patient Details  Name: Cindy Phillips MRN: [id number] Date of Birth: [birth year] Referring Provider (PT): Lucianne Lei, MD   Encounter Date: 09/12/2021   PT End of Session - 09/12/21 1447     Visit Number 15    Number of Visits 17    Date for PT Re-Evaluation 09/27/21    Authorization Type UHC MCR    PT Start Time 2197    PT Stop Time 1524    PT Time Calculation (min) 39 min    Activity Tolerance Patient tolerated treatment well    Behavior During Therapy WFL for tasks assessed/performed             Past Medical History:  Diagnosis Date   Bipolar 1 disorder ([place])    Bowel incontinence    Depression    Diabetes mellitus without complication ([place])    Mental disability     Past Surgical History:  Procedure Laterality Date   FOOT SURGERY     KNEE ARTHROSCOPY W/ ACL RECONSTRUCTION Left 10/12/2020    There were no vitals filed for this visit.   Subjective Assessment - 09/12/21 1447     Subjective Pt presents to PT with reports of continued lower back pain. She notes that pain increased after her MRI 2 days ago. Pt has been compliant with her HEP with no adverse effect. Pt is ready to begin PT at this time.    Currently in Pain? Yes    Pain Score 5     Pain Location Back    Pain Orientation Lower    Multiple Pain Sites Yes    Pain Score 2    Pain Location Knee    Pain Orientation Left           [facility] Adult PT Treatment/Exercise:   Therapeutic Exercise:  NuStep lvl 8 x 5 min while taking subjective Standing lumbar extension x 15 POE x 60" Prone press up 2x10 Prone supermans x 10  STS 2x10 10lbs overhead press Lateral walk RTB 3x51f at toes Supine L SLR x 15     [facility] PT Assessment - 09/12/21 0001       Observation/Other Assessments   Focus on Therapeutic Outcomes  (FOTO)  65% function                                    PT Education - 09/12/21 1526     Education Details HEP    Person(s) Educated Patient    Methods Explanation;Demonstration;Handout    Comprehension Verbalized understanding;Returned demonstration              PT Short Term Goals - 07/06/21 1911       PT SHORT TERM GOAL #1   Title Pt will be compliant and knowledgeable with initial HEP for improved carryover    Baseline initial HEP given    Time 3    Period Weeks    Status Achieved    Target Date 06/28/21               PT Long Term Goals - 09/12/21 1534       PT LONG TERM GOAL #1   Title Pt will improve FOTO to no less than 73% function as proxy for functional improvement    Baseline  50% function; 60% function on 12/14; 65% on 1/24    Time 8    Period Weeks    Status Partially Met    Target Date 09/27/21      PT LONG TERM GOAL #2   Title Pt will increase 30 Sec STS to no less than 10 reps for improved comfort and functional mobility    Baseline 8 reps; 12 reps on 12/14    Time 8    Period Weeks    Status Achieved      PT LONG TERM GOAL #3   Title Pt will self report LBP no greater than 3/10 at worst for improved comfort and function    Baseline 8/10 at worst    Time 8    Period Weeks    Status Partially Met    Target Date 09/27/21      PT LONG TERM GOAL #4   Title Pt will be knowledgeable with advanced HEP for continued improvement post d/c from Pt    Time 8    Period Weeks    Status Achieved                   Plan - 09/12/21 1453     Clinical Impression Statement Pt presents to PT with no adverse effect or change in baseline and demonstrated knowlege of HEP with no adverse effect. She demonstrated increase in subjective functional ability with higher FOTO score at today's session. Therapy focused on improving core endurance, proximal hip/quad strength, and lumbar ROM. PT will hold sessions until visit with  orthopedics on 1/26 to determine next steps in care after recent MRI. Pt is knowledgeable of HEP and also should continue to improve with compliance.    PT Treatment/Interventions ADLs/Self Care Home Management;Cryotherapy;Electrical Stimulation;Iontophoresis 31m/ml Dexamethasone;Moist Heat;Ultrasound;Gait training;Stair training;Functional mobility training;Therapeutic activities;Therapeutic exercise;Balance training;Neuromuscular re-education;Patient/family education;Manual techniques;Passive range of motion;Taping;Vasopneumatic Device;Dry needling    PT Next Visit Plan assess response to HEP; progress core and proximal hip strengthening    PT Home Exercise Plan Access Code: [email or web address]            Patient will benefit from skilled therapeutic intervention in order to improve the following deficits and impairments:  Improper body mechanics, Increased muscle spasms, Pain, Decreased activity tolerance, Postural dysfunction, Difficulty walking, Abnormal gait, Decreased strength, Decreased endurance, Increased edema, Decreased balance  Visit Diagnosis: Chronic right-sided low back pain without sciatica  Muscle weakness (generalized)     Problem List Patient Active Problem List   Diagnosis Date Noted   Pain in left elbow 07/27/2020   Prediabetes 02/03/2019   Obstructive sleep apnea 04/23/2018   Other fatigue 11/26/2017   Shortness of breath on exertion 11/26/2017   Type 2 diabetes mellitus without complication, without long-term current use of insulin ([place] 11/26/2017   Other hyperlipidemia 11/26/2017   Vitamin D deficiency 11/26/2017   Schizoaffective disorder, bipolar type ([place] 09/14/2014   Intellectual disability 09/10/2014    DWard Chatters PT 09/12/2021, 3:43 PM  [facility] [address]Phone: [phone]  Fax:  [phone] Name: PDIONNE KNOOPMRN: [id number] of Birth:  201/19/1992  PHYSICAL THERAPY DISCHARGE SUMMARY  Visits from Start of Care: 15  Current functional level related to goals / functional outcomes: See goals and objective   Remaining deficits: See goals and objective   Education / Equipment: HEP   Patient agrees to discharge. Patient goals were partially met. Patient is  being discharged due to being pleased with the current functional level.

## 2021-09-13 ENCOUNTER — Telehealth: Payer: Self-pay | Admitting: Radiology

## 2021-09-13 NOTE — Telephone Encounter (Signed)
Moved over to [facility] schedule

## 2021-09-13 NOTE — Telephone Encounter (Signed)
Patient is scheduled for MRI review with Jeneen Rinks tomorrow. She was supposed to follow up with Dr. Louanne Skye. Can you call the patient and work her in with Dr. Louanne Skye?

## 2021-09-14 ENCOUNTER — Ambulatory Visit: Payer: Medicare Other

## 2021-09-14 ENCOUNTER — Encounter: Payer: Self-pay | Admitting: Specialist

## 2021-09-14 ENCOUNTER — Ambulatory Visit (INDEPENDENT_AMBULATORY_CARE_PROVIDER_SITE_OTHER): Payer: Medicare Other | Admitting: Specialist

## 2021-09-14 ENCOUNTER — Other Ambulatory Visit: Payer: Self-pay

## 2021-09-14 VITALS — BP 154/95 | HR 105 | Ht 71.0 in | Wt 306.0 lb

## 2021-09-14 DIAGNOSIS — M545 Low back pain, unspecified: Secondary | ICD-10-CM | POA: Diagnosis not present

## 2021-09-14 DIAGNOSIS — M5137 Other intervertebral disc degeneration, lumbosacral region: Secondary | ICD-10-CM | POA: Diagnosis not present

## 2021-09-14 MED ORDER — DICLOFENAC SODIUM 50 MG PO TBEC: 50.0000 mg | DELAYED_RELEASE_TABLET | Freq: Two times a day (BID) | ORAL | 2 refills | Status: DC

## 2021-09-14 MED ORDER — TRAMADOL-ACETAMINOPHEN 37.5-325 MG PO TABS: 1.0000 | ORAL_TABLET | Freq: Four times a day (QID) | ORAL | 0 refills | Status: DC | PRN

## 2021-09-14 NOTE — Progress Notes (Signed)
Office Visit Note   Patient: Cindy Phillips           Date of Birth: 1990-08-30           MRN: [id number] Visit Date: 09/14/2021              Requested by: Lucianne Lei, MD [address] [place],  [place] [address] PCP: Lucianne Lei, MD   Assessment & Plan: Visit Diagnoses:  1. Acute low back pain, unspecified back pain laterality, unspecified whether sciatica present   2. Disc disease, degenerative, lumbar or lumbosacral     Plan: Avoid bending, stooping and avoid lifting weights greater than 10 lbs. Avoid prolong standing and walking. Avoid frequent bending and stooping  No lifting greater than 10 lbs. May use ice or moist heat for pain. Weight loss is of benefit. Handicap license is approved. Walking for exercise, pool walking, stationary bike.  Diclofenac Tramadol for mor severe pain.  Stop the meloxicam.  Follow-Up Instructions: No follow-ups on file.   Orders:  No orders of the defined types were placed in this encounter.  Meds ordered this encounter  Medications   diclofenac (VOLTAREN) 50 MG EC tablet    Sig: Take 1 tablet (50 mg total) by mouth 2 (two) times daily.    Dispense:  60 tablet    Refill:  2   traMADol-acetaminophen (ULTRACET) 37.5-325 MG tablet    Sig: Take 1 tablet by mouth every 6 (six) hours as needed.    Dispense:  30 tablet    Refill:  0      Procedures: No procedures performed   Clinical Data: Findings:  CLINICAL DATA:  Initial evaluation for low back pain with bilateral lower extremity radiculopathy.   EXAM: MRI LUMBAR SPINE WITHOUT CONTRAST   TECHNIQUE: Multiplanar, multisequence MR imaging of the lumbar spine was performed. No intravenous contrast was administered.   COMPARISON:  Prior radiograph from 05/17/2021.   FINDINGS: Segmentation: Standard. Lowest well-formed disc space labeled the L5-S1 level.   Alignment: Physiologic with preservation of the normal lumbar lordosis. No listhesis.   Vertebrae:  Vertebral body height maintained without acute or chronic fracture. Bone marrow signal intensity within normal limits. No discrete or worrisome osseous lesions or abnormal marrow edema.   Conus medullaris and cauda equina: Conus extends to the T12-L1 level. Conus and cauda equina appear normal.   Paraspinal and other soft tissues: Unremarkable.   Disc levels:   Lumbar spine is image from the T11-12 level inferiorly. No significant findings are seen through the L3-4 level.   L4-5: Normal interspace. Mild bilateral facet hypertrophy. No stenosis or impingement.   L5-S1: Disc desiccation. Superimposed small central disc protrusion with slight inferior migration (series 5, image 37). Disc material closely approximates the descending S1 nerve roots without frank neural impingement or displacement, slightly greater on the right. Mild left-sided facet arthrosis. No significant spinal stenosis. Foramina remain patent.   IMPRESSION: 1. Small central disc protrusion at L5-S1, closely approximating the descending S1 nerve roots without frank neural impingement or displacement, slightly greater on the right. 2. Mild bilateral facet hypertrophy at L4-5 and L5-S1.     Electronically Signed   By: Jeannine Boga M.D.   On: 09/10/2021 20:20     Subjective: Chief Complaint  Patient presents with   Lower Back - Follow-up    MRI Review    31 year old female with history of onset of back pain and is worse with with standing and  walking. No weakness, just pain In the middle of the back, at the belt line.  No bowel or bladder changes. She is not able to walk a mile, sometimes I and sometimes I can't. Saw Benjiman Core and an MRI was ordered. She has been to PT and just finished therapy on her back at the PT on [facility]. She took some over the counter medication and  also was given meloxicam for her knee and he has had her taking it, had an ACL reconstruction 06/2021 only 2 months ago.  Therapy was also for the knee. The back pain is a "8-10".   Review of Systems  Constitutional: Negative.   HENT: Negative.    Eyes: Negative.   Respiratory: Negative.    Cardiovascular: Negative.   Gastrointestinal: Negative.   Endocrine: Negative.   Genitourinary: Negative.   Musculoskeletal: Negative.   Skin: Negative.   Allergic/Immunologic: Negative.   Neurological: Negative.   Hematological: Negative.   Psychiatric/Behavioral: Negative.      Objective: Vital Signs: BP (!) 154/95 (BP Location: Left Arm, Patient Position: Sitting)    Pulse (!) 105    Ht 5\' 11"  (1.803 m)    Wt (!) 306 lb (138.8 kg)    BMI 42.68 kg/m   Physical Exam Constitutional:      Appearance: She is well-developed.  HENT:     Head: Normocephalic and atraumatic.  Eyes:     Pupils: Pupils are equal, round, and reactive to light.  Pulmonary:     Effort: Pulmonary effort is normal.     Breath sounds: Normal breath sounds.  Abdominal:     General: Bowel sounds are normal.     Palpations: Abdomen is soft.  Musculoskeletal:     Cervical back: Normal range of motion and neck supple.     Lumbar back: Negative right straight leg raise test and negative left straight leg raise test.  Skin:    General: Skin is warm and dry.  Neurological:     Mental Status: She is alert and oriented to person, place, and time.  Psychiatric:        Behavior: Behavior normal.        Thought Content: Thought content normal.        Judgment: Judgment normal.    Back Exam   Tenderness  The patient is experiencing tenderness in the lumbar.  Range of Motion  Extension:  abnormal  Flexion:  abnormal  Lateral bend right:  abnormal  Lateral bend left:  abnormal  Rotation right:  abnormal  Rotation left:  abnormal   Muscle Strength  Right Quadriceps:  5/5  Left Quadriceps:  5/5  Right Hamstrings:  5/5  Left Hamstrings:  5/5   Tests  Straight leg raise right: negative Straight leg raise left:  negative  Reflexes  Patellar:  2/4 Achilles:  2/4  Other  Toe walk: normal Heel walk: normal Sensation: normal  Comments:  SLR is negative.      Specialty Comments:  No specialty comments available.  Imaging: No results found.   PMFS History: Patient Active Problem List   Diagnosis Date Noted   Pain in left elbow 07/27/2020   Prediabetes 02/03/2019   Obstructive sleep apnea 04/23/2018   Other fatigue 11/26/2017   Shortness of breath on exertion 11/26/2017   Type 2 diabetes mellitus without complication, without long-term current use of insulin ([place]) 11/26/2017   Other hyperlipidemia 11/26/2017   Vitamin D deficiency 11/26/2017   Schizoaffective disorder, bipolar type ([place])  09/14/2014   Intellectual disability 09/10/2014   Past Medical History:  Diagnosis Date   Bipolar 1 disorder ([place])    Bowel incontinence    Depression    Diabetes mellitus without complication ([place])    Mental disability     Family History  Problem Relation Age of Onset   High blood pressure Mother    Anxiety disorder Mother    Diabetes Maternal Grandmother     Past Surgical History:  Procedure Laterality Date   FOOT SURGERY     KNEE ARTHROSCOPY W/ ACL RECONSTRUCTION Left 10/12/2020   Social History   Occupational History   Occupation: Clinical research associate     Comment: [employer]   Tobacco Use   Smoking status: Never   Smokeless tobacco: Never  Vaping Use   Vaping Use: Never used  Substance and Sexual Activity   Alcohol use: No   Drug use: No   Sexual activity: Not Currently

## 2021-09-17 DIAGNOSIS — E1165 Type 2 diabetes mellitus with hyperglycemia: Secondary | ICD-10-CM | POA: Diagnosis not present

## 2021-09-17 DIAGNOSIS — E785 Hyperlipidemia, unspecified: Secondary | ICD-10-CM | POA: Diagnosis not present

## 2021-09-27 DIAGNOSIS — G518 Other disorders of facial nerve: Secondary | ICD-10-CM | POA: Diagnosis not present

## 2021-09-27 DIAGNOSIS — M542 Cervicalgia: Secondary | ICD-10-CM | POA: Diagnosis not present

## 2021-09-27 DIAGNOSIS — M791 Myalgia, unspecified site: Secondary | ICD-10-CM | POA: Diagnosis not present

## 2021-09-27 DIAGNOSIS — G43719 Chronic migraine without aura, intractable, without status migrainosus: Secondary | ICD-10-CM | POA: Diagnosis not present

## 2021-10-10 DIAGNOSIS — E119 Type 2 diabetes mellitus without complications: Secondary | ICD-10-CM | POA: Diagnosis not present

## 2021-10-10 DIAGNOSIS — H04123 Dry eye syndrome of bilateral lacrimal glands: Secondary | ICD-10-CM | POA: Diagnosis not present

## 2021-10-10 DIAGNOSIS — H1045 Other chronic allergic conjunctivitis: Secondary | ICD-10-CM | POA: Diagnosis not present

## 2021-10-10 DIAGNOSIS — H53003 Unspecified amblyopia, bilateral: Secondary | ICD-10-CM | POA: Diagnosis not present

## 2021-10-17 ENCOUNTER — Other Ambulatory Visit: Payer: Self-pay | Admitting: Specialist

## 2021-10-17 DIAGNOSIS — E1165 Type 2 diabetes mellitus with hyperglycemia: Secondary | ICD-10-CM | POA: Diagnosis not present

## 2021-10-17 DIAGNOSIS — E785 Hyperlipidemia, unspecified: Secondary | ICD-10-CM | POA: Diagnosis not present

## 2021-11-06 DIAGNOSIS — E785 Hyperlipidemia, unspecified: Secondary | ICD-10-CM | POA: Diagnosis not present

## 2021-11-06 DIAGNOSIS — G4733 Obstructive sleep apnea (adult) (pediatric): Secondary | ICD-10-CM | POA: Diagnosis not present

## 2021-11-06 DIAGNOSIS — E1169 Type 2 diabetes mellitus with other specified complication: Secondary | ICD-10-CM | POA: Diagnosis not present

## 2021-11-08 DIAGNOSIS — M791 Myalgia, unspecified site: Secondary | ICD-10-CM | POA: Diagnosis not present

## 2021-11-08 DIAGNOSIS — G518 Other disorders of facial nerve: Secondary | ICD-10-CM | POA: Diagnosis not present

## 2021-11-08 DIAGNOSIS — G43719 Chronic migraine without aura, intractable, without status migrainosus: Secondary | ICD-10-CM | POA: Diagnosis not present

## 2021-11-08 DIAGNOSIS — M542 Cervicalgia: Secondary | ICD-10-CM | POA: Diagnosis not present

## 2021-11-13 ENCOUNTER — Encounter: Payer: Self-pay | Admitting: Specialist

## 2021-11-13 ENCOUNTER — Ambulatory Visit (INDEPENDENT_AMBULATORY_CARE_PROVIDER_SITE_OTHER): Payer: Medicare Other | Admitting: Specialist

## 2021-11-13 ENCOUNTER — Other Ambulatory Visit: Payer: Self-pay

## 2021-11-13 ENCOUNTER — Ambulatory Visit (INDEPENDENT_AMBULATORY_CARE_PROVIDER_SITE_OTHER): Payer: Medicare Other

## 2021-11-13 VITALS — BP 138/81 | HR 96 | Ht 71.0 in | Wt 306.0 lb

## 2021-11-13 DIAGNOSIS — M545 Low back pain, unspecified: Secondary | ICD-10-CM | POA: Diagnosis not present

## 2021-11-13 DIAGNOSIS — S39012A Strain of muscle, fascia and tendon of lower back, initial encounter: Secondary | ICD-10-CM | POA: Diagnosis not present

## 2021-11-13 DIAGNOSIS — M25552 Pain in left hip: Secondary | ICD-10-CM

## 2021-11-13 DIAGNOSIS — M25551 Pain in right hip: Secondary | ICD-10-CM

## 2021-11-13 DIAGNOSIS — M5416 Radiculopathy, lumbar region: Secondary | ICD-10-CM

## 2021-11-13 DIAGNOSIS — M4726 Other spondylosis with radiculopathy, lumbar region: Secondary | ICD-10-CM

## 2021-11-13 DIAGNOSIS — M5137 Other intervertebral disc degeneration, lumbosacral region: Secondary | ICD-10-CM | POA: Diagnosis not present

## 2021-11-13 DIAGNOSIS — M25559 Pain in unspecified hip: Secondary | ICD-10-CM

## 2021-11-13 MED ORDER — TRAMADOL HCL 50 MG PO TABS: 100.0000 mg | ORAL_TABLET | Freq: Four times a day (QID) | ORAL | 0 refills | Status: AC | PRN

## 2021-11-13 MED ORDER — CYCLOBENZAPRINE HCL 10 MG PO TABS: 10.0000 mg | ORAL_TABLET | Freq: Three times a day (TID) | ORAL | 0 refills | Status: DC | PRN

## 2021-11-13 NOTE — Patient Instructions (Addendum)
Avoid bending, stooping and avoid lifting weights greater than 10 lbs. ?Avoid prolong standing and walking. ?Avoid frequent bending and stooping  ?No lifting greater than 10 lbs. ?May use ice or moist heat for pain. ?Weight loss is of benefit. ?Handicap license is approved. ?Dr. Romona Curls secretary/Assistant will call to arrange for right hip steroid and local injection ?Right L5-S1 facet block   ?Increase tramadol to 100 mg every 6 hours ?Change from baclofen to flexeril.  ?

## 2021-11-13 NOTE — Progress Notes (Signed)
? ?Office Visit Note ?  ?Patient: Cindy Phillips           ?Date of Birth: Apr 05, 1991           ?MRN: [id number] ?Visit Date: 11/13/2021 ?             ?Requested by: Lucianne Lei, MD ?[address] ?[address] ?[place],  [place] [address] ?PCP: Lucianne Lei, MD ? ? ?Assessment & Plan: ?Visit Diagnoses:  ?1. Hip pain   ?2. Strain of lumbar region, initial encounter   ?3. Acute low back pain, unspecified back pain laterality, unspecified whether sciatica present   ?4. Disc disease, degenerative, lumbar or lumbosacral   ?5. Radiculopathy, lumbar region   ?6. Other spondylosis with radiculopathy, lumbar region   ? ? ?Plan: Avoid bending, stooping and avoid lifting weights greater than 10 lbs. ?Avoid prolong standing and walking. ?Avoid frequent bending and stooping  ?No lifting greater than 10 lbs. ?May use ice or moist heat for pain. ?Weight loss is of benefit. ?Handicap license is approved. ?Dr. Romona Curls secretary/Assistant will call to arrange for right hip steroid and local injection ?Right L5-S1 facet block   ?Increase tramadol to 100 mg every 6 hours ?Change from baclofen to flexeril.  ? ?Follow-Up Instructions: Return in about 4 weeks (around 12/11/2021).  ? ?Orders:  ?Orders Placed This Encounter  ?Procedures  ? XR HIPS BILAT W OR W/O PELVIS 3-4 VIEWS  ? ?Meds ordered this encounter  ?Medications  ? cyclobenzaprine (FLEXERIL) 10 MG tablet  ?  Sig: Take 1 tablet (10 mg total) by mouth 3 (three) times daily as needed for muscle spasms.  ?  Dispense:  30 tablet  ?  Refill:  0  ? traMADol (ULTRAM) 50 MG tablet  ?  Sig: Take 2 tablets (100 mg total) by mouth every 6 (six) hours as needed for up to 7 days for moderate pain.  ?  Dispense:  40 tablet  ?  Refill:  0  ? ? ? ? Procedures: ?No procedures performed ? ? ?Clinical Data: ?No additional findings. ? ? ?Subjective: ?Chief Complaint  ?Patient presents with  ? Lower Back - Follow-up  ?  States that she is still having a lot of pain in the lower back and she had pain in the  right hip as well  ? ? ?31  year old female with history of back and hip pain. The hip pain is mainly right sided and it is worse with prolong sleeping and lying on the right side. No bowel or bladder difficulty. She has pain with walking and standing. Sitting and riding not as bad. Pain is an 8 on  a scale of 1-10. Underwent previous left knee arthroscopy by Dr. Lorenz Coaster. Has been seen by Benjiman Core and had an MRI done 08/2021. The study is showing a bulging disc at L5-S1 with some enhancement of the out posterior fibers of the disc. No mass effect on the cord or nerve roots. There is minimal disc dessication at the L5-S1. Reports having fallen on the right hip getting out of bed yesterday with increased right hip pain.  ? ? ?Review of Systems  ?Constitutional: Negative.   ?HENT: Negative.    ?Eyes: Negative.   ?Respiratory: Negative.    ?Cardiovascular: Negative.   ?Gastrointestinal: Negative.   ?Endocrine: Negative.   ?Genitourinary: Negative.   ?Musculoskeletal: Negative.   ?Skin: Negative.   ?Allergic/Immunologic: Negative.   ?Neurological: Negative.   ?Hematological: Negative.   ?Psychiatric/Behavioral: Negative.    ? ? ?  Objective: ?Vital Signs: BP 138/81 (BP Location: Left Arm, Patient Position: Sitting)   Pulse 96   Ht '5\' 11"'$  (1.803 m)   Wt (!) 306 lb (138.8 kg)   BMI 42.68 kg/m?  ? ?Physical Exam ?Musculoskeletal:  ?   Lumbar back: Negative right straight leg raise test and negative left straight leg raise test.  ? ?Back Exam  ? ?Tenderness  ?The patient is experiencing tenderness in the lumbar. ? ?Range of Motion  ?Extension:  abnormal  ?Flexion:  abnormal  ?Lateral bend right:  abnormal  ?Lateral bend left:  abnormal  ?Rotation right:  abnormal  ?Rotation left:  abnormal  ? ?Muscle Strength  ?Right Quadriceps:  5/5  ?Right Hamstrings:  5/5  ? ?Tests  ?Straight leg raise right: negative ?Straight leg raise left: negative ? ?Other  ?Toe walk: normal ?Heel walk: normal ?Sensation: normal ? ?Comments:   Right hip with pain with ROM flexion is to 110 degrees left hip with 90 flexion. IR right knee is 15 degrees external rotation is 25 degrees. Pain and stiffness with first standing.  ? ? ? ?Specialty Comments:  ?No specialty comments available. ? ?Imaging: ?XR HIPS BILAT W OR W/O PELVIS 3-4 VIEWS ? ?Result Date: 11/13/2021 ?AP and lateral radiographs bilateral hips with superolateral narrowing of the left hip joint more that the right. SI joints well maintained. Increased bone formation at the lumbosacral facet joints. Previous lumbar spine with fairly well maintained disc heights no listhesis noted.   ? ? ?PMFS History: ?Patient Active Problem List  ? Diagnosis Date Noted  ? Pain in left elbow 07/27/2020  ? Prediabetes 02/03/2019  ? Obstructive sleep apnea 04/23/2018  ? Other fatigue 11/26/2017  ? Shortness of breath on exertion 11/26/2017  ? Type 2 diabetes mellitus without complication, without long-term current use of insulin ([place]) 11/26/2017  ? Other hyperlipidemia 11/26/2017  ? Vitamin D deficiency 11/26/2017  ? Schizoaffective disorder, bipolar type ([place]) 09/14/2014  ? Intellectual disability 09/10/2014  ? ?Past Medical History:  ?Diagnosis Date  ? Bipolar 1 disorder ([place])   ? Bowel incontinence   ? Depression   ? Diabetes mellitus without complication ([place])   ? Mental disability   ?  ?Family History  ?Problem Relation Age of Onset  ? High blood pressure Mother   ? Anxiety disorder Mother   ? Diabetes Maternal Grandmother   ?  ?Past Surgical History:  ?Procedure Laterality Date  ? FOOT SURGERY    ? KNEE ARTHROSCOPY W/ ACL RECONSTRUCTION Left 10/12/2020  ? ?Social History  ? ?Occupational History  ? Occupation: Clinical research associate   ?  Comment: [employer]   ?Tobacco Use  ? Smoking status: Never  ? Smokeless tobacco: Never  ?Vaping Use  ? Vaping Use: Never used  ?Substance and Sexual Activity  ? Alcohol use: No  ? Drug use: No  ? Sexual activity: Not Currently  ? ? ? ? ? ? ?

## 2021-11-17 DIAGNOSIS — E1165 Type 2 diabetes mellitus with hyperglycemia: Secondary | ICD-10-CM | POA: Diagnosis not present

## 2021-11-17 DIAGNOSIS — E785 Hyperlipidemia, unspecified: Secondary | ICD-10-CM | POA: Diagnosis not present

## 2021-11-20 DIAGNOSIS — M25562 Pain in left knee: Secondary | ICD-10-CM | POA: Diagnosis not present

## 2021-12-07 DIAGNOSIS — M25562 Pain in left knee: Secondary | ICD-10-CM | POA: Diagnosis not present

## 2021-12-09 ENCOUNTER — Other Ambulatory Visit: Payer: Self-pay | Admitting: Specialist

## 2021-12-12 DIAGNOSIS — M25562 Pain in left knee: Secondary | ICD-10-CM | POA: Diagnosis not present

## 2021-12-14 ENCOUNTER — Ambulatory Visit (INDEPENDENT_AMBULATORY_CARE_PROVIDER_SITE_OTHER): Payer: Medicare Other | Admitting: Specialist

## 2021-12-14 ENCOUNTER — Encounter: Payer: Self-pay | Admitting: Specialist

## 2021-12-14 VITALS — BP 126/91 | HR 111 | Ht 71.0 in | Wt 306.0 lb

## 2021-12-14 DIAGNOSIS — M5137 Other intervertebral disc degeneration, lumbosacral region: Secondary | ICD-10-CM

## 2021-12-14 DIAGNOSIS — M4726 Other spondylosis with radiculopathy, lumbar region: Secondary | ICD-10-CM | POA: Diagnosis not present

## 2021-12-14 MED ORDER — TRAMADOL-ACETAMINOPHEN 37.5-325 MG PO TABS: 1.0000 | ORAL_TABLET | Freq: Four times a day (QID) | ORAL | 0 refills | Status: DC | PRN

## 2021-12-14 NOTE — Patient Instructions (Signed)
?  Plan: Avoid bending, stooping and avoid lifting weights greater than 10 lbs. ?Avoid prolong standing and walking. ?Avoid frequent bending and stooping  ?No lifting greater than 10 lbs. ?May use ice or moist heat for pain. ?Weight loss is of benefit. ?Handicap license is approved. ?Dr. Romona Curls secretary/Assistant will call to arrange for bilateral L5-S1 facet blocks ?

## 2021-12-14 NOTE — Progress Notes (Signed)
? ?Office Visit Note ?  ?Patient: Cindy Phillips           ?Date of Birth: 1991-07-07           ?MRN: [id number] ?Visit Date: 12/14/2021 ?             ?Requested by: Lucianne Lei, MD ?[address] ?[place],  [place] [address] ?PCP: Lucianne Lei, MD ? ? ?Assessment & Plan: ?Visit Diagnoses:  ?1. Disc disease, degenerative, lumbar or lumbosacral   ?2. Other spondylosis with radiculopathy, lumbar region   ? ? ?Plan: Avoid bending, stooping and avoid lifting weights greater than 10 lbs. ?Avoid prolong standing and walking. ?Avoid frequent bending and stooping  ?No lifting greater than 10 lbs. ?May use ice or moist heat for pain. ?Weight loss is of benefit. ?Handicap license is approved. ?Dr. Romona Curls secretary/Assistant will call to arrange for bilateral L5-S1 facet blocks ? ?Follow-Up Instructions: No follow-ups on file.  ? ?Orders:  ?No orders of the defined types were placed in this encounter. ? ?No orders of the defined types were placed in this encounter. ? ? ? ? Procedures: ?No procedures performed ? ? ?Clinical Data: ?No additional findings. ? ? ?Subjective: ?Chief Complaint  ?Patient presents with  ? Lower Back - Follow-up  ? Right Hip - Follow-up  ? ? ?31 year old female with history of back pain and in the past with right leg pain. She reports that the pain started after left knee surgery and she was doing therapy and overused the left leg. No bowel or bladder difficulty. Reports that Facets have not been administered.  ? ? ?Review of Systems  ?Constitutional: Negative.   ?HENT: Negative.    ?Eyes: Negative.   ?Respiratory: Negative.    ?Cardiovascular: Negative.   ?Gastrointestinal: Negative.   ?Endocrine: Negative.   ?Genitourinary: Negative.   ?Musculoskeletal: Negative.   ?Skin: Negative.   ?Allergic/Immunologic: Negative.   ?Neurological: Negative.   ?Hematological: Negative.   ?Psychiatric/Behavioral: Negative.    ? ? ?Objective: ?Vital Signs: BP (!) 126/91 (BP Location: Left Arm, Patient Position:  Sitting)   Pulse (!) 111   Ht '5\' 11"'$  (1.803 m)   Wt (!) 306 lb (138.8 kg)   BMI 42.68 kg/m?  ? ?Physical Exam ?Constitutional:   ?   Appearance: She is well-developed.  ?HENT:  ?   Head: Normocephalic and atraumatic.  ?Eyes:  ?   Pupils: Pupils are equal, round, and reactive to light.  ?Pulmonary:  ?   Effort: Pulmonary effort is normal.  ?   Breath sounds: Normal breath sounds.  ?Abdominal:  ?   General: Bowel sounds are normal.  ?   Palpations: Abdomen is soft.  ?Musculoskeletal:  ?   Cervical back: Normal range of motion and neck supple.  ?   Lumbar back: Negative right straight leg raise test and negative left straight leg raise test.  ?Skin: ?   General: Skin is warm and dry.  ?Neurological:  ?   Mental Status: She is alert and oriented to person, place, and time.  ?Psychiatric:     ?   Behavior: Behavior normal.     ?   Thought Content: Thought content normal.     ?   Judgment: Judgment normal.  ? ? ?Back Exam  ? ?Tenderness  ?The patient is experiencing tenderness in the lumbar. ? ?Range of Motion  ?Extension:  abnormal  ?Flexion:  abnormal  ?Lateral bend right:  abnormal  ?Rotation right:  abnormal  ?Rotation left:  abnormal  ? ?Muscle Strength  ?Right Quadriceps:  5/5  ?Left Quadriceps:  5/5  ?Right Hamstrings:  5/5  ?Left Hamstrings:  5/5  ? ?Tests  ?Straight leg raise right: negative ?Straight leg raise left: negative ? ?Other  ?Toe walk: normal ?Heel walk: normal ?Sensation: normal ?Gait: normal  ? ? ? ? ?Specialty Comments:  ?No specialty comments available. ? ?Imaging: ?No results found. ? ? ?PMFS History: ?Patient Active Problem List  ? Diagnosis Date Noted  ? Pain in left elbow 07/27/2020  ? Prediabetes 02/03/2019  ? Obstructive sleep apnea 04/23/2018  ? Other fatigue 11/26/2017  ? Shortness of breath on exertion 11/26/2017  ? Type 2 diabetes mellitus without complication, without long-term current use of insulin ([place]) 11/26/2017  ? Other hyperlipidemia 11/26/2017  ? Vitamin D deficiency  11/26/2017  ? Schizoaffective disorder, bipolar type ([place]) 09/14/2014  ? Intellectual disability 09/10/2014  ? ?Past Medical History:  ?Diagnosis Date  ? Bipolar 1 disorder ([place])   ? Bowel incontinence   ? Depression   ? Diabetes mellitus without complication ([place])   ? Mental disability   ?  ?Family History  ?Problem Relation Age of Onset  ? High blood pressure Mother   ? Anxiety disorder Mother   ? Diabetes Maternal Grandmother   ?  ?Past Surgical History:  ?Procedure Laterality Date  ? FOOT SURGERY    ? KNEE ARTHROSCOPY W/ ACL RECONSTRUCTION Left 10/12/2020  ? ?Social History  ? ?Occupational History  ? Occupation: Clinical research associate   ?  Comment: [employer]   ?Tobacco Use  ? Smoking status: Never  ? Smokeless tobacco: Never  ?Vaping Use  ? Vaping Use: Never used  ?Substance and Sexual Activity  ? Alcohol use: No  ? Drug use: No  ? Sexual activity: Not Currently  ? ? ? ? ? ? ?

## 2021-12-15 ENCOUNTER — Other Ambulatory Visit: Payer: Self-pay | Admitting: *Deleted

## 2021-12-17 DIAGNOSIS — E1165 Type 2 diabetes mellitus with hyperglycemia: Secondary | ICD-10-CM | POA: Diagnosis not present

## 2021-12-17 DIAGNOSIS — E785 Hyperlipidemia, unspecified: Secondary | ICD-10-CM | POA: Diagnosis not present

## 2021-12-18 DIAGNOSIS — M25562 Pain in left knee: Secondary | ICD-10-CM | POA: Diagnosis not present

## 2021-12-18 NOTE — Patient Outreach (Signed)
[facility] Care Management ?RN Health Coach Note ? ? ?12/18/2021 ?Name:  Cindy Phillips MRN:  [id number] DOB:  [birth year] ? ?Summary: ?Patient fasting blood sugar is 130. Her A1C is 5.8. Her appetite is good. She is currently still having left knee pain. She is looking at alternative treatment vs knee replacement.  ? ?Recommendations/Changes made from today's visit: ?Medication adherence ?Dietary adherence ? ? ?Subjective: ?Cindy Phillips is an 31 y.o. year old female who is a primary patient of Lucianne Lei, MD. The care management team was consulted for assistance with care management and/or care coordination needs.   ? ?RN Health Coach completed Telephone Visit today.  ? ?Objective: ? ?Medications Reviewed Today   ? ? Reviewed by Minda Ditto, Christy, RT (Technologist) on 12/14/21 at 1357  Med List Status: <None>  ? ?Medication Order Taking? Sig Documenting Provider Last Dose Status Informant  ?atorvastatin (LIPITOR) 10 MG tablet [id number] No Take 10 mg by mouth daily. [provider] Taking Active   ?cyclobenzaprine (FLEXERIL) 10 MG tablet [id number]  Take 1 tablet (10 mg total) by mouth 3 (three) times daily as needed for muscle spasms. Jessy Oto, MD  Active   ?diazepam (VALIUM) 5 MG tablet [id number] No Take 1 tablet 30 minutes prior to MRI Garald Balding, MD Taking Active   ?diazepam (VALIUM) 5 MG tablet [id number]  Take one tablet one hour prior to procedure. May repeat if needed. MUST HAVE DRIVER. Marybelle Killings, MD  Active   ?diclofenac (VOLTAREN) 50 MG EC tablet [id number]  TAKE 1 TABLET BY MOUTH TWICE A DAY Jessy Oto, MD  Active   ?hydrOXYzine (ATARAX/VISTARIL) 50 MG tablet [id number] No Take 1 tablet (50 mg total) by mouth every 4 (four) hours as needed for anxiety. Lindell Spar I, NP Taking Active   ?lamoTRIgine (LAMICTAL) 25 MG tablet [id number] No Take 1 tablet (25 mg total) by mouth daily. For mood stabilization Lindell Spar I, NP Taking Active    ?medroxyPROGESTERone (DEPO-PROVERA) 150 MG/ML injection [id number] No Inject 1 mL (150 mg total) into the muscle every 3 (three) months. For prevention of pregnancy Lindell Spar I, NP Taking Active   ?metFORMIN (GLUCOPHAGE) 500 MG tablet [id number] No Take one tablet by mouth in the morning and one tablet by mouth in the evening Starlyn Skeans, MD Taking Active   ?OLANZapine zydis (ZYPREXA) 15 MG disintegrating tablet [id number] No Take 1 tablet (15 mg total) by mouth at bedtime. For mood control Nwoko, Herbert Pun I, NP Taking Active   ?ondansetron (ZOFRAN ODT) 4 MG disintegrating tablet [id number] No Take 1 tablet (4 mg total) by mouth every 8 (eight) hours as needed. McDonald, Mia A, PA-C Taking Active   ?polyethylene glycol powder (GLYCOLAX/MIRALAX) powder [id number] No Take 1 Container by mouth once. [provider] Taking Active   ?sodium chloride (OCEAN) 0.65 % SOLN nasal spray [id number] No Place 1 spray into both nostrils as needed for congestion. Lindell Spar I, NP Taking Active   ?traMADol-acetaminophen (ULTRACET) 37.5-325 MG tablet [id number]  TAKE 1 TABLET BY MOUTH EVERY 6 HOURS AS NEEDED Jessy Oto, MD  Active   ?Vitamin D, Ergocalciferol, (DRISDOL) 1.25 MG (50000 UNIT) CAPS capsule [id number] No Take 1 capsule (50,000 Units total) by mouth every 3 (three) days. Dennard Nip D, MD Taking Active   ?zonisamide (ZONEGRAN) 25 MG capsule [id number] No Take 25 mg by mouth daily. [provider] Taking Active   ? ?  ?  ? ?  ? ? ? ?  SDOH:  (Social Determinants of Health) assessments and interventions performed:  ?SDOH Interventions   ? ?Flowsheet Row Most Recent Value  ?SDOH Interventions   ?Food Insecurity Interventions Intervention Not Indicated  ?Housing Interventions Intervention Not Indicated  ?Transportation Interventions Intervention Not Indicated  ? ?  ? ? ?Care Plan ? ?Review of patient past medical history, allergies, medications, health status, including review of consultants reports,  laboratory and other test data, was performed as part of comprehensive evaluation for care management services.  ? ?Care Plan : RN Care Manager Plan of Care  ?Updates made by Lilya Smitherman, Eppie Gibson, RN since 12/18/2021 12:00 AM  ?  ? ?Problem: Knowledge Deficit Related to Diabetes and Care Coordination Needs   ?Priority: High  ?  ? ?Long-Range Goal: Development Plan of Care for Management of Diabetes   ?Start Date: 12/15/2021  ?Expected End Date: 12/16/2022  ?Priority: High  ?Note:   ?Current Barriers:  ?Knowledge Deficits related to plan of care for management of DMII  ? ?RNCM Clinical Goal(s):  ?Patient will verbalize understanding of plan for management of DMII as evidenced by continuation of monitoring blood sugars and adhering to diabetic diet  through collaboration with RN Care manager, provider, and care team.  ? ?Interventions: ?Inter-disciplinary care team collaboration (see longitudinal plan of care) ?Evaluation of current treatment plan related to  self management and patient's adherence to plan as established by provider ? ? ?Diabetes Interventions:  (Status:  Goal on track:  Yes.) Long Term Goal ?Assessed patient's understanding of A1c goal: <6.5% ?Provided education to patient about basic DM disease process ?Lab Results  ?Component Value Date  ? HGBA1C 5.9 (H) 01/06/2020  ? ?Patient Goals/Self-Care Activities: ?Take all medications as prescribed ?Call pharmacy for medication refills 3-7 days in advance of running out of medications ?Perform all self care activities independently  ?Perform IADL's (shopping, preparing meals, housekeeping, managing finances) independently ?Call provider office for new concerns or questions  ?call the Suicide and Crisis Lifeline: 988 if experiencing a Mental Health or [place]  ?keep appointment with eye doctor ?check blood sugar at prescribed times: once daily ?check feet daily for cuts, sores or redness ?trim toenails straight across ?drink 6 to 8 glasses of  water each day ?manage portion size ?set a realistic goal ?wash and dry feet carefully every day ?wear comfortable, cotton socks ?wear comfortable, well-fitting shoes ? ?Follow Up Plan:  Telephone follow up appointment with care management team member scheduled for:  March 06, 2022. ?The patient has been provided with contact information for the care management team and has been advised to call with any health related questions or concerns.   ? ?[id number] Patient fasting blood sugar was 130. Her A1C [place] 5.8.  Per patient her appetite is good. She is adhering to her diet. She has currently finished rehabilitation but she stated that the left knee pain is getting worse. She does not want to have a total knee replacement and will follow up for other alternative treatments. Patient weight is currently at 306 pounds.  ?  ?  ? ?Plan: Telephone follow up appointment with care management team member scheduled for:  March 06, 2022 ?The patient has been provided with contact information for the care management team and has been advised to call with any health related questions or concerns.  ? ?Johny Shock BSN RN ?[facility] ?[phone] ? ? ? ?  ?

## 2021-12-18 NOTE — Patient Instructions (Signed)
Visit Information ? ?Thank you for taking time to visit with me today. Please don't hesitate to contact me if I can be of assistance to you before our next scheduled telephone appointment. ? ?Following are the goals we discussed today:  ?Current Barriers:  ?Knowledge Deficits related to plan of care for management of DMII  ? ?RNCM Clinical Goal(s):  ?Patient will verbalize understanding of plan for management of DMII as evidenced by continuation of monitoring blood sugars and adhering to diabetic diet through collaboration with RN Care manager, provider, and care team.  ? ?Interventions: ?Inter-disciplinary care team collaboration (see longitudinal plan of care) ?Evaluation of current treatment plan related to  self management and patient's adherence to plan as established by provider ? ? ?Diabetes Interventions:  (Status:  Goal on track:  Yes.) Long Term Goal ?Assessed patient's understanding of A1c goal: <6.5% ?Provided education to patient about basic DM disease process ?Lab Results  ?Component Value Date  ? HGBA1C 5.9 (H) 01/06/2020  ? ? ?Patient Goals/Self-Care Activities: ?Take all medications as prescribed ?Call pharmacy for medication refills 3-7 days in advance of running out of medications ?Perform all self care activities independently  ?Perform IADL's (shopping, preparing meals, housekeeping, managing finances) independently ?Call provider office for new concerns or questions  ?call the Suicide and Crisis Lifeline: 988 if experiencing a Mental Health or [place]  ?keep appointment with eye doctor ?check blood sugar at prescribed times: once daily ?check feet daily for cuts, sores or redness ?trim toenails straight across ?drink 6 to 8 glasses of water each day ?manage portion size ?set a realistic goal ?wash and dry feet carefully every day ?wear comfortable, cotton socks ?wear comfortable, well-fitting shoes ? ?Follow Up Plan:  Telephone follow up appointment with care management team  member scheduled for:  March 06, 2022. ?The patient has been provided with contact information for the care management team and has been advised to call with any health related questions or concerns.   ? ?[id number] Patient fasting blood sugar was 130. Her A1C [place] 5.8.  Per patient her appetite is good. She is adhering to her diet. She has currently finished rehabilitation but she stated that the left knee pain is getting worse. She does not want to have a total knee replacement and will follow up for other alternative treatments. Patient weight is currently at 306 pounds.  ? ?Our next appointment is by telephone on March 06, 2022 ? ?Please call  Johny Shock RN [phone] if you need to cancel or reschedule your appointment.  ? ?Please call the Suicide and Crisis Lifeline: 988 if you are experiencing a Mental Health or [place] or need someone to talk to. ? ?The patient verbalized understanding of instructions, educational materials, and care plan provided today and agreed to receive a mailed copy of patient instructions, educational materials, and care plan.  ? ?Telephone follow up appointment with care management team member scheduled for: ?The patient has been provided with contact information for the care management team and has been advised to call with any health related questions or concerns.  ? ?SIGNATURE ?Johny Shock BSN RN ?[facility] ?[phone] ? ? ? ? ?

## 2021-12-20 DIAGNOSIS — M791 Myalgia, unspecified site: Secondary | ICD-10-CM | POA: Diagnosis not present

## 2021-12-20 DIAGNOSIS — G518 Other disorders of facial nerve: Secondary | ICD-10-CM | POA: Diagnosis not present

## 2021-12-20 DIAGNOSIS — M542 Cervicalgia: Secondary | ICD-10-CM | POA: Diagnosis not present

## 2021-12-20 DIAGNOSIS — G43719 Chronic migraine without aura, intractable, without status migrainosus: Secondary | ICD-10-CM | POA: Diagnosis not present

## 2022-01-11 ENCOUNTER — Ambulatory Visit (INDEPENDENT_AMBULATORY_CARE_PROVIDER_SITE_OTHER): Payer: Medicare Other | Admitting: Specialist

## 2022-01-11 ENCOUNTER — Encounter: Payer: Self-pay | Admitting: Specialist

## 2022-01-11 VITALS — BP 130/89 | Ht 71.0 in | Wt 306.0 lb

## 2022-01-11 DIAGNOSIS — M545 Low back pain, unspecified: Secondary | ICD-10-CM

## 2022-01-11 DIAGNOSIS — M5137 Other intervertebral disc degeneration, lumbosacral region: Secondary | ICD-10-CM | POA: Diagnosis not present

## 2022-01-11 DIAGNOSIS — M4726 Other spondylosis with radiculopathy, lumbar region: Secondary | ICD-10-CM

## 2022-01-11 NOTE — Progress Notes (Signed)
Office Visit Note   Patient: Cindy Phillips           Date of Birth: 1991/07/14           MRN: [id number] Visit Date: 01/11/2022              Requested by: Lucianne Lei, [address] [place],  [place] [address] PCP: Lucianne Lei, MD   Assessment & Plan: Visit Diagnoses:  1. Other spondylosis with radiculopathy, lumbar region   2. Disc disease, degenerative, lumbar or lumbosacral   3. Acute low back pain, unspecified back pain laterality, unspecified whether sciatica present     Plan: Avoid bending, stooping and avoid lifting weights greater than 10 lbs. Avoid prolong standing and walking. Avoid frequent bending and stooping  No lifting greater than 10 lbs. May use ice or moist heat for pain. Weight loss is of benefit. Handicap license is approved. Dr. Romona Curls secretary/Assistant will call to arrange for bilateral L5-S1 facet blocks May wish to consider use of lyrica in place of gabapenin twice a day. Will see how the  Joint blocks do first.   Follow-Up Instructions: Return in about 4 weeks (around 02/08/2022).   Orders:  No orders of the defined types were placed in this encounter.  No orders of the defined types were placed in this encounter.     Procedures: No procedures performed   Clinical Data: No additional findings.   Subjective: Chief Complaint  Patient presents with   Lower Back - Pain    31 year old female with ongoing back and buttock pain no neurologic deficit. ESI without much help she is to undergo facet blocks at L5-S1 on 6/6/  Review of Systems  Constitutional: Negative.   HENT: Negative.    Eyes: Negative.   Respiratory: Negative.    Cardiovascular: Negative.   Gastrointestinal: Negative.   Endocrine: Negative.   Genitourinary: Negative.   Musculoskeletal: Negative.   Skin: Negative.   Allergic/Immunologic: Negative.   Neurological: Negative.   Hematological: Negative.   Psychiatric/Behavioral: Negative.       Objective: Vital Signs: BP 130/89   Ht '5\' 11"'$  (1.803 m)   Wt (!) 306 lb (138.8 kg)   BMI 42.68 kg/m   Physical Exam Musculoskeletal:     Lumbar back: Negative right straight leg raise test and negative left straight leg raise test.   Back Exam   Tenderness  The patient is experiencing tenderness in the lumbar.  Range of Motion  Extension:  abnormal  Flexion:  abnormal  Lateral bend left:  abnormal  Rotation right:  abnormal   Muscle Strength  Right Quadriceps:  5/5  Left Quadriceps:  5/5  Right Hamstrings:  5/5  Left Hamstrings:  5/5   Tests  Straight leg raise right: negative Straight leg raise left: negative  Reflexes  Patellar:  0/4 Achilles:  0/4    Specialty Comments:  No specialty comments available.  Imaging: No results found.   PMFS History: Patient Active Problem List   Diagnosis Date Noted   Pain in left elbow 07/27/2020   Prediabetes 02/03/2019   Obstructive sleep apnea 04/23/2018   Other fatigue 11/26/2017   Shortness of breath on exertion 11/26/2017   Type 2 diabetes mellitus without complication, without long-term current use of insulin ([place]) 11/26/2017   Other hyperlipidemia 11/26/2017   Vitamin D deficiency 11/26/2017   Schizoaffective disorder, bipolar type ([place]) 09/14/2014   Intellectual disability 09/10/2014   Past Medical History:  Diagnosis  Date   Bipolar 1 disorder ([place])    Bowel incontinence    Depression    Diabetes mellitus without complication ([place])    Mental disability     Family History  Problem Relation Age of Onset   High blood pressure Mother    Anxiety disorder Mother    Diabetes Maternal Grandmother     Past Surgical History:  Procedure Laterality Date   FOOT SURGERY     KNEE ARTHROSCOPY W/ ACL RECONSTRUCTION Left 10/12/2020   Social History   Occupational History   Occupation: Clinical research associate     Comment: [employer]   Tobacco Use   Smoking status: Never   Smokeless tobacco: Never  Vaping Use    Vaping Use: Never used  Substance and Sexual Activity   Alcohol use: No   Drug use: No   Sexual activity: Not Currently

## 2022-01-11 NOTE — Progress Notes (Signed)
Office Visit Note   Patient: Cindy Phillips           Date of Birth: 1991-03-12           MRN: [id number] Visit Date: 01/11/2022              Requested by: Lucianne Lei, [address] [place],  [place] [address] PCP: Lucianne Lei, MD   Assessment & Plan: Visit Diagnoses:  1. Other spondylosis with radiculopathy, lumbar region   2. Disc disease, degenerative, lumbar or lumbosacral   3. Acute low back pain, unspecified back pain laterality, unspecified whether sciatica present     Plan: Avoid bending, stooping and avoid lifting weights greater than 10 lbs. Avoid prolong standing and walking. Avoid frequent bending and stooping  No lifting greater than 10 lbs. May use ice or moist heat for pain. Weight loss is of benefit. Handicap license is approved. Dr. Romona Curls secretary/Assistant will call to arrange for bilateral L5-S1 facet blocks    Follow-Up Instructions: No follow-ups on file.   Orders:  No orders of the defined types were placed in this encounter.  No orders of the defined types were placed in this encounter.     Procedures: No procedures performed   Clinical Data: No additional findings.   Subjective: Chief Complaint  Patient presents with   Lower Back - Pain    31 year old female with history of back and buttock pain, taking ultracet for more severe pain and is on lamictal. He is to undergo a steroid injection with local of the facets at L5-S1. No bowel or bladder difficulty. Wants to wait to see how the injections do before changing meds.  He is to under go the facet blocks on 6/6.   Review of Systems  Constitutional: Negative.   HENT:  Positive for congestion, rhinorrhea and sinus pain.   Eyes: Negative.   Respiratory:  Positive for shortness of breath.   Cardiovascular: Negative.   Gastrointestinal: Negative.   Endocrine: Negative.   Genitourinary: Negative.   Musculoskeletal:  Positive for arthralgias, back pain and gait problem.   Skin: Negative.   Allergic/Immunologic: Negative.   Hematological: Negative.   Psychiatric/Behavioral: Negative.      Objective: Vital Signs: BP 130/89   Ht '5\' 11"'$  (1.803 m)   Wt (!) 306 lb (138.8 kg)   BMI 42.68 kg/m   Physical Exam  Ortho Exam  Specialty Comments:  No specialty comments available.  Imaging: No results found.   PMFS History: Patient Active Problem List   Diagnosis Date Noted   Pain in left elbow 07/27/2020   Prediabetes 02/03/2019   Obstructive sleep apnea 04/23/2018   Other fatigue 11/26/2017   Shortness of breath on exertion 11/26/2017   Type 2 diabetes mellitus without complication, without long-term current use of insulin ([place]) 11/26/2017   Other hyperlipidemia 11/26/2017   Vitamin D deficiency 11/26/2017   Schizoaffective disorder, bipolar type ([place]) 09/14/2014   Intellectual disability 09/10/2014   Past Medical History:  Diagnosis Date   Bipolar 1 disorder ([place])    Bowel incontinence    Depression    Diabetes mellitus without complication ([place])    Mental disability     Family History  Problem Relation Age of Onset   High blood pressure Mother    Anxiety disorder Mother    Diabetes Maternal Grandmother     Past Surgical History:  Procedure Laterality Date   FOOT SURGERY     KNEE ARTHROSCOPY  W/ ACL RECONSTRUCTION Left 10/12/2020   Social History   Occupational History   Occupation: Clinical research associate     Comment: [employer]   Tobacco Use   Smoking status: Never   Smokeless tobacco: Never  Vaping Use   Vaping Use: Never used  Substance and Sexual Activity   Alcohol use: No   Drug use: No   Sexual activity: Not Currently

## 2022-01-11 NOTE — Patient Instructions (Signed)
Plan: Avoid bending, stooping and avoid lifting weights greater than 10 lbs. Avoid prolong standing and walking. Avoid frequent bending and stooping  No lifting greater than 10 lbs. May use ice or moist heat for pain. Weight loss is of benefit. Handicap license is approved. Dr. Romona Curls secretary/Assistant will call to arrange for bilateral L5-S1 facet blocks May wish to consider use of lyrica in place of gabapenin twice a day. Will see how the  Joint blocks do first.

## 2022-01-16 ENCOUNTER — Ambulatory Visit: Payer: Medicare Other | Attending: Specialist

## 2022-01-16 DIAGNOSIS — R2689 Other abnormalities of gait and mobility: Secondary | ICD-10-CM | POA: Diagnosis not present

## 2022-01-16 DIAGNOSIS — M6281 Muscle weakness (generalized): Secondary | ICD-10-CM | POA: Insufficient documentation

## 2022-01-16 DIAGNOSIS — M25562 Pain in left knee: Secondary | ICD-10-CM | POA: Insufficient documentation

## 2022-01-16 DIAGNOSIS — M5459 Other low back pain: Secondary | ICD-10-CM | POA: Diagnosis not present

## 2022-01-16 DIAGNOSIS — G8929 Other chronic pain: Secondary | ICD-10-CM | POA: Diagnosis not present

## 2022-01-16 NOTE — Therapy (Signed)
OUTPATIENT PHYSICAL THERAPY THORACOLUMBAR EVALUATION   Patient Name: Cindy Phillips MRN: [id number] DOB:1991-02-12, 31 y.o., female Today's Date: 01/16/2022   PT End of Session - 01/16/22 1149     Visit Number 1    Number of Visits 17    Date for PT Re-Evaluation 03/13/22    Authorization Type U[employer] Medicare    PT Start Time 1215    PT Stop Time 1245    PT Time Calculation (min) 30 min    Activity Tolerance Patient tolerated treatment well    Behavior During Therapy WFL for tasks assessed/performed             Past Medical History:  Diagnosis Date   Bipolar 1 disorder ([place])    Bowel incontinence    Depression    Diabetes mellitus without complication ([place])    Mental disability    Past Surgical History:  Procedure Laterality Date   FOOT SURGERY     KNEE ARTHROSCOPY W/ ACL RECONSTRUCTION Left 10/12/2020   Patient Active Problem List   Diagnosis Date Noted   Pain in left elbow 07/27/2020   Prediabetes 02/03/2019   Obstructive sleep apnea 04/23/2018   Other fatigue 11/26/2017   Shortness of breath on exertion 11/26/2017   Type 2 diabetes mellitus without complication, without long-term current use of insulin ([place]) 11/26/2017   Other hyperlipidemia 11/26/2017   Vitamin D deficiency 11/26/2017   Schizoaffective disorder, bipolar type ([place]) 09/14/2014   Intellectual disability 09/10/2014    PCP: Lucianne Lei, MD  REFERRING PROVIDER: Jessy Oto, MD  REFERRING DIAG:  [phone] (ICD-10-CM) - Other spondylosis with radiculopathy, lumbar region  Rationale for Evaluation and Treatment Rehabilitation  THERAPY DIAG:  Muscle weakness (generalized) - Plan: PT plan of care cert/re-cert  Chronic pain of left knee - Plan: PT plan of care cert/re-cert  Other abnormalities of gait and mobility - Plan: PT plan of care cert/re-cert  Other low back pain - Plan: PT plan of care cert/re-cert  ONSET DATE: Chronic  SUBJECTIVE:                                                                                                                                                                                            SUBJECTIVE STATEMENT: Pt presents to PT with reports of L LE pain, specially around her medial knee. She also notes chronic LBP and discomfort with no referral or paresthesias. She is well known to therapy and has had good success with PT previously. Pt denies bowel/bladder changes or saddle anesthesia.   PERTINENT HISTORY:  DM II, L ACL repair, intellectual disability  PAIN:  Are you  having pain?  Yes: NPRS scale: 0/10 (7/10 at worst) Pain location: L medial knee Pain description: sore Aggravating factors: prolonged sitting, prolonged standing, walking Relieving factors: icy-hot   PRECAUTIONS: None  WEIGHT BEARING RESTRICTIONS No  FALLS:  Has patient fallen in last 6 months? No  LIVING ENVIRONMENT: Lives with: lives with their family Lives in: House/apartment Stairs: No Has following equipment at home: Crutches  OCCUPATION: Not working  PLOF: Independent and Independent with basic ADLs  PATIENT GOALS: Pt wants to decrease pain in lower back and L LE in order to improve mobility and functional ability with ADLs and basketball   OBJECTIVE:   DIAGNOSTIC FINDINGS:   CLINICAL DATA:  Initial evaluation for low back pain with bilateral lower extremity radiculopathy.   EXAM: MRI LUMBAR SPINE WITHOUT CONTRAST   TECHNIQUE: Multiplanar, multisequence MR imaging of the lumbar spine was performed. No intravenous contrast was administered.   COMPARISON:  Prior radiograph from 05/17/2021.   FINDINGS: Segmentation: Standard. Lowest well-formed disc space labeled the L5-S1 level.   Alignment: Physiologic with preservation of the normal lumbar lordosis. No listhesis.   Vertebrae: Vertebral body height maintained without acute or chronic fracture. Bone marrow signal intensity within normal limits. No discrete or worrisome osseous  lesions or abnormal marrow edema.   Conus medullaris and cauda equina: Conus extends to the T12-L1 level. Conus and cauda equina appear normal.   Paraspinal and other soft tissues: Unremarkable.   Disc levels:   Lumbar spine is image from the T11-12 level inferiorly. No significant findings are seen through the L3-4 level.   L4-5: Normal interspace. Mild bilateral facet hypertrophy. No stenosis or impingement.   L5-S1: Disc desiccation. Superimposed small central disc protrusion with slight inferior migration (series 5, image 37). Disc material closely approximates the descending S1 nerve roots without frank neural impingement or displacement, slightly greater on the right. Mild left-sided facet arthrosis. No significant spinal stenosis. Foramina remain patent.   IMPRESSION: 1. Small central disc protrusion at L5-S1, closely approximating the descending S1 nerve roots without frank neural impingement or displacement, slightly greater on the right. 2. Mild bilateral facet hypertrophy at L4-5 and L5-S1.  AP and lateral radiographs bilateral hips with superolateral narrowing of  the left hip joint more that the right. SI joints well maintained.  Increased bone formation at the lumbosacral facet joints. Previous lumbar  spine with fairly well maintained disc heights no listhesis noted.     PATIENT SURVEYS:  FOTO: 30% function; 44% predicted  COGNITION:  Overall cognitive status: Within functional limits for tasks assessed     SENSATION: WFL  POSTURE: Large body habitus, increased lumbar lordosis, rounded shoulders, fwd head  PALPATION: TTP to bilateral lumbar paraspinals  LUMBAR ROM:   Active  A/PROM  eval  Flexion WFL  Extension WFL   (Blank rows = not tested)  LE MMT:  MMT Right 01/16/2022 Left 01/16/2022  Hip flexion     Hip extension    Hip abduction    Hip adduction    Hip external rotation    Hip internal rotation    Knee extension    Knee flexion     Ankle dorsiflexion     Ankle plantarflexion    Ankle inversion    Ankle eversion    Grossly 4/5 4/5  (Blank rows = not tested)  LUMBAR SPECIAL TESTS:  Straight leg raise test: Negative and Slump test: Negative  FUNCTIONAL TESTS:  30 Second Sit to Stand: 13 reps Plank hold time: 3  sec - on hands  GAIT: Distance walked: 70f Assistive device utilized: None Level of assistance: Complete Independence Comments: slight antalgic gait L  TODAY'S TREATMENT  [facility] Adult PT Treatment:                                                DATE: 01/16/2022 Therapeutic Exercise: POE x 60" Bridge x 5 SLR x 10 L  PATIENT EDUCATION:  Education details: eval findings, FOTO, HEP, POC Person educated: Patient Education method: Explanation, Demonstration, and Handouts Education comprehension: verbalized understanding and returned demonstration  HOME EXERCISE PROGRAM: Access Code: [email or web address]: [email or web address] Date: 01/16/2022 Prepared by: DOctavio Manns Exercises - Static Prone on Elbows  - 1 x daily - 7 x weekly - 2 reps - 60 secs hold - Supine Bridge  - 1 x daily - 7 x weekly - 2 sets - 10 reps - 3 sec hold - Active Straight Leg Raise with Quad Set  - 1 x daily - 7 x weekly - 3 sets - 10 reps - Sit to Stand Without Arm Support  - 1 x daily - 7 x weekly - 3 sets - 10 reps - Supine Hamstring Stretch with Strap  - 1 x daily - 7 x weekly - 2-3 reps - 30 sec hold  ASSESSMENT:  CLINICAL IMPRESSION: Patient is a 31y.o. F who was seen today for physical therapy evaluation and treatment for chronic L knee and lower back pain. Physical findings are consistent with physician impression, as pt demonstrates core and proximal hip weakness as well as functional mobility deficits assessed via 30 Second Sit to Stand. Her FOTO score indicates decrease in functional ability below PLOF and shows she would benefit from skilled PT services working on improving strength and mobility.      OBJECTIVE IMPAIRMENTS decreased activity tolerance, decreased endurance, decreased mobility, decreased ROM, decreased strength, and pain.   ACTIVITY LIMITATIONS carrying, lifting, bending, and transfers  PARTICIPATION LIMITATIONS: meal prep, cleaning, laundry, medication management, and yard work  [place] Time since onset of injury/illness/exacerbation, and 1-2 comorbidities: DM II, L ACL repair, intellectual disability  are also affecting patient's functional outcome.   REHAB POTENTIAL: Excellent  CLINICAL DECISION MAKING: Stable/uncomplicated  EVALUATION COMPLEXITY: Low   GOALS: Goals reviewed with patient? No  SHORT TERM GOALS: Target date: 02/06/2022  Pt will be compliant and knowledgeable with initial HEP for improved comfort and carryover Baseline: initial HEP given Goal status: INITIAL  2.  Pt will self report lower back and L LE pain no greater than 5/10 for improved comfort and functional ability Baseline: 7/10 at worst Goal status: INITIAL  LONG TERM GOALS: Target date: 03/13/2022  Pt will self report lower back and L LE pain no greater than 2/10 for improved comfort and functional ability Baseline: 7/10 at worst  Goal status: INITIAL  2.  Pt will improve FOTO function score to no less than 44% as proxy for functional improvement Baseline: 30% function Goal status: INITIAL  3.  Pt will increase 30 Second Sit to Stand rep count to no less than 13 reps for improved balance, strength, and functional mobility Baseline: 11 reps  Goal status: INITIAL  4.  Pt will be able to hold a static plank for no less than 15 seconds for improved core endurance and functional ability Baseline: 3 seconds Goal status:  INITIAL  PLAN: PT FREQUENCY: 1-2x/week  PT DURATION: 8 weeks  PLANNED INTERVENTIONS: Therapeutic exercises, Therapeutic activity, Neuromuscular re-education, Balance training, Gait training, Patient/Family education, Joint mobilization, Dry  Needling, Electrical stimulation, Cryotherapy, Moist heat, Manual therapy, and Re-evaluation.  PLAN FOR NEXT SESSION: assess HEP response, progress core and proximal hip strength as able   Ward Chatters, PT 01/16/2022, 1:51 PM

## 2022-01-17 DIAGNOSIS — E1165 Type 2 diabetes mellitus with hyperglycemia: Secondary | ICD-10-CM | POA: Diagnosis not present

## 2022-01-17 DIAGNOSIS — E785 Hyperlipidemia, unspecified: Secondary | ICD-10-CM | POA: Diagnosis not present

## 2022-01-23 ENCOUNTER — Ambulatory Visit (INDEPENDENT_AMBULATORY_CARE_PROVIDER_SITE_OTHER): Payer: Medicare Other | Admitting: Physical Medicine and Rehabilitation

## 2022-01-23 ENCOUNTER — Ambulatory Visit: Payer: Self-pay

## 2022-01-23 ENCOUNTER — Encounter: Payer: Self-pay | Admitting: Physical Medicine and Rehabilitation

## 2022-01-23 VITALS — BP 132/76 | HR 94

## 2022-01-23 DIAGNOSIS — M47816 Spondylosis without myelopathy or radiculopathy, lumbar region: Secondary | ICD-10-CM

## 2022-01-23 DIAGNOSIS — M5116 Intervertebral disc disorders with radiculopathy, lumbar region: Secondary | ICD-10-CM | POA: Diagnosis not present

## 2022-01-23 MED ORDER — METHYLPREDNISOLONE ACETATE 80 MG/ML IJ SUSP
80.0000 mg | Freq: Once | INTRAMUSCULAR | Status: AC
  Administered 2022-01-23: 80 mg

## 2022-01-23 NOTE — Progress Notes (Unsigned)
Pt state lower back pain that travels down her left leg. Pt state walking makes the pain worse. Pt state she uses icy hot and takes over the counter pain meds to help ease her pain.  Numeric Pain Rating Scale and Functional Assessment Average Pain 8   In the last MONTH (on 0-10 scale) has pain interfered with the following?  1. General activity like being  able to carry out your everyday physical activities such as walking, climbing stairs, carrying groceries, or moving a chair?  Rating(10)   +Driver, -BT, -Dye Allergies.

## 2022-01-23 NOTE — Patient Instructions (Signed)

## 2022-01-30 ENCOUNTER — Ambulatory Visit: Payer: Medicare Other | Attending: Specialist

## 2022-01-30 DIAGNOSIS — M6281 Muscle weakness (generalized): Secondary | ICD-10-CM | POA: Insufficient documentation

## 2022-01-30 DIAGNOSIS — M542 Cervicalgia: Secondary | ICD-10-CM | POA: Diagnosis not present

## 2022-01-30 DIAGNOSIS — M25562 Pain in left knee: Secondary | ICD-10-CM | POA: Diagnosis not present

## 2022-01-30 DIAGNOSIS — G8929 Other chronic pain: Secondary | ICD-10-CM | POA: Diagnosis not present

## 2022-01-30 DIAGNOSIS — G518 Other disorders of facial nerve: Secondary | ICD-10-CM | POA: Diagnosis not present

## 2022-01-30 DIAGNOSIS — G43719 Chronic migraine without aura, intractable, without status migrainosus: Secondary | ICD-10-CM | POA: Diagnosis not present

## 2022-01-30 DIAGNOSIS — M791 Myalgia, unspecified site: Secondary | ICD-10-CM | POA: Diagnosis not present

## 2022-01-30 NOTE — Therapy (Signed)
OUTPATIENT PHYSICAL THERAPY TREATMENT NOTE   Patient Name: Cindy Phillips MRN: [id number] DOB:12/05/1990, 31 y.o., female Today's Date: 01/30/2022  PCP: Lucianne Lei, MD REFERRING PROVIDER: Jessy Oto, MD  END OF SESSION:   PT End of Session - 01/30/22 0942     Visit Number 2    Number of Visits 17    Date for PT Re-Evaluation 03/13/22    Authorization Type UHC Medicare    PT Start Time 0945    PT Stop Time 1020    PT Time Calculation (min) 35 min    Activity Tolerance Patient tolerated treatment well    Behavior During Therapy WFL for tasks assessed/performed             Past Medical History:  Diagnosis Date   Bipolar 1 disorder ([place])    Bowel incontinence    Depression    Diabetes mellitus without complication ([place])    Mental disability    Past Surgical History:  Procedure Laterality Date   FOOT SURGERY     KNEE ARTHROSCOPY W/ ACL RECONSTRUCTION Left 10/12/2020   Patient Active Problem List   Diagnosis Date Noted   Pain in left elbow 07/27/2020   Prediabetes 02/03/2019   Obstructive sleep apnea 04/23/2018   Other fatigue 11/26/2017   Shortness of breath on exertion 11/26/2017   Type 2 diabetes mellitus without complication, without long-term current use of insulin ([place]) 11/26/2017   Other hyperlipidemia 11/26/2017   Vitamin D deficiency 11/26/2017   Schizoaffective disorder, bipolar type ([place]) 09/14/2014   Intellectual disability 09/10/2014    REFERRING DIAG: M47.26 (ICD-10-CM) - Other spondylosis with radiculopathy, lumbar region  THERAPY DIAG:  Muscle weakness (generalized)  Chronic pain of left knee  Rationale for Evaluation and Treatment Rehabilitation  PERTINENT HISTORY: Chronic  PRECAUTIONS: DM II, L ACL repair, intellectual disability  SUBJECTIVE:  Pt presents to PT with no current reports of pain. She has been compliant with HEP with no adverse effect. Pt is ready to begin PT at this time.   PAIN:  Are you having pain?  Yes:  NPRS scale: 0/10 (7/10 at worst) Pain location: L medial knee Pain description: sore Aggravating factors: prolonged sitting, prolonged standing, walking Relieving factors: icy-hot   OBJECTIVE: (objective measures completed at initial evaluation unless otherwise dated)  PATIENT SURVEYS:  FOTO: 30% function; 44% predicted   LUMBAR ROM:    Active  A/PROM  eval  Flexion WFL  Extension WFL   (Blank rows = not tested)   LE MMT:   MMT Right 01/16/2022 Left 01/16/2022  Hip flexion       Hip extension      Hip abduction      Hip adduction      Hip external rotation      Hip internal rotation      Knee extension      Knee flexion      Ankle dorsiflexion       Ankle plantarflexion      Ankle inversion      Ankle eversion      Grossly 4/5 4/5  (Blank rows = not tested)   LUMBAR SPECIAL TESTS:  Straight leg raise test: Negative and Slump test: Negative   FUNCTIONAL TESTS:  30 Second Sit to Stand: 13 reps Plank hold time: 3 sec - on hands   TODAY'S TREATMENT  [facility] Adult PT Treatment:  DATE: 01/30/2022 Therapeutic Exercise: POE x 60" Prone press up x 10  Plank from knees 2x15" Bridge 2x10 - 5" hold SLR 2x15 each STS 2x10 - no UE support  Deadbug x 10 Supine bicycle x 10  [facility] Adult PT Treatment:                                                DATE: 01/16/2022 Therapeutic Exercise: POE x 60" Bridge x 5 SLR x 10 L   PATIENT EDUCATION:  Education details: eval findings, FOTO, HEP, POC Person educated: Patient Education method: Explanation, Demonstration, and Handouts Education comprehension: verbalized understanding and returned demonstration   HOME EXERCISE PROGRAM: Access Code: [email or web address] URL: [email or web address] Date: 01/16/2022 Prepared by: Octavio Manns   Exercises - Static Prone on Elbows  - 1 x daily - 7 x weekly - 2 reps - 60 secs hold - Supine Bridge  - 1 x daily - 7 x weekly - 2 sets - 10 reps  - 3 sec hold - Active Straight Leg Raise with Quad Set  - 1 x daily - 7 x weekly - 3 sets - 10 reps - Sit to Stand Without Arm Support  - 1 x daily - 7 x weekly - 3 sets - 10 reps - Supine Hamstring Stretch with Strap  - 1 x daily - 7 x weekly - 2-3 reps - 30 sec hold   ASSESSMENT:   CLINICAL IMPRESSION: Pt was able to complete all prescribed exercises with no adverse effect. Therapy focused on improving core and proximal hip strength for decreasing pain and improving mobility. She continues to demonstrate decreased L quad strength with SLR and continues to benefit from skilled PT. Will continue to progress as tolerated per POC.     OBJECTIVE IMPAIRMENTS decreased activity tolerance, decreased endurance, decreased mobility, decreased ROM, decreased strength, and pain.    ACTIVITY LIMITATIONS carrying, lifting, bending, and transfers   PARTICIPATION LIMITATIONS: meal prep, cleaning, laundry, medication management, and yard work   [place], Time since onset of injury/illness/exacerbation, and 1-2 comorbidities: DM II, L ACL repair, intellectual disability  are also affecting patient's functional outcome.      GOALS: Goals reviewed with patient? No   SHORT TERM GOALS: Target date: 02/06/2022   Pt will be compliant and knowledgeable with initial HEP for improved comfort and carryover Baseline: initial HEP given Goal status: INITIAL   2.  Pt will self report lower back and L LE pain no greater than 5/10 for improved comfort and functional ability Baseline: 7/10 at worst Goal status: INITIAL   LONG TERM GOALS: Target date: 03/13/2022   Pt will self report lower back and L LE pain no greater than 2/10 for improved comfort and functional ability Baseline: 7/10 at worst  Goal status: INITIAL   2.  Pt will improve FOTO function score to no less than 44% as proxy for functional improvement Baseline: 30% function Goal status: INITIAL   3.  Pt will increase 30 Second Sit  to Stand rep count to no less than 13 reps for improved balance, strength, and functional mobility Baseline: 11 reps  Goal status: INITIAL   4.  Pt will be able to hold a static plank for no less than 15 seconds for improved core endurance and functional ability Baseline: 3 seconds Goal status: INITIAL   PLAN: PT FREQUENCY:  1-2x/week   PT DURATION: 8 weeks   PLANNED INTERVENTIONS: Therapeutic exercises, Therapeutic activity, Neuromuscular re-education, Balance training, Gait training, Patient/Family education, Joint mobilization, Dry Needling, Electrical stimulation, Cryotherapy, Moist heat, Manual therapy, and Re-evaluation.   PLAN FOR NEXT SESSION: assess HEP response, progress core and proximal hip strength as able    Ward Chatters, PT 01/30/2022, 12:03 PM

## 2022-02-01 ENCOUNTER — Ambulatory Visit: Payer: Medicare Other

## 2022-02-01 DIAGNOSIS — G8929 Other chronic pain: Secondary | ICD-10-CM

## 2022-02-01 DIAGNOSIS — M25562 Pain in left knee: Secondary | ICD-10-CM | POA: Diagnosis not present

## 2022-02-01 DIAGNOSIS — M6281 Muscle weakness (generalized): Secondary | ICD-10-CM | POA: Diagnosis not present

## 2022-02-01 NOTE — Therapy (Signed)
OUTPATIENT PHYSICAL THERAPY TREATMENT NOTE   Patient Name: Cindy Phillips MRN: [id number] DOB:Dec 26, 1990, 31 y.o., female Today's Date: 02/01/2022  PCP: Lucianne Lei, MD REFERRING PROVIDER: Jessy Oto, MD  END OF SESSION:   PT End of Session - 02/01/22 1043     Visit Number 3    Number of Visits 17    Date for PT Re-Evaluation 03/13/22    Authorization Type UHC Medicare    PT Start Time 5631    PT Stop Time 1120    PT Time Calculation (min) 35 min    Activity Tolerance Patient tolerated treatment well    Behavior During Therapy WFL for tasks assessed/performed              Past Medical History:  Diagnosis Date   Bipolar 1 disorder ([place])    Bowel incontinence    Depression    Diabetes mellitus without complication ([place])    Mental disability    Past Surgical History:  Procedure Laterality Date   FOOT SURGERY     KNEE ARTHROSCOPY W/ ACL RECONSTRUCTION Left 10/12/2020   Patient Active Problem List   Diagnosis Date Noted   Pain in left elbow 07/27/2020   Prediabetes 02/03/2019   Obstructive sleep apnea 04/23/2018   Other fatigue 11/26/2017   Shortness of breath on exertion 11/26/2017   Type 2 diabetes mellitus without complication, without long-term current use of insulin ([place]) 11/26/2017   Other hyperlipidemia 11/26/2017   Vitamin D deficiency 11/26/2017   Schizoaffective disorder, bipolar type ([place]) 09/14/2014   Intellectual disability 09/10/2014    REFERRING DIAG: M47.26 (ICD-10-CM) - Other spondylosis with radiculopathy, lumbar region  THERAPY DIAG:  Muscle weakness (generalized)  Chronic pain of left knee  Rationale for Evaluation and Treatment Rehabilitation  PERTINENT HISTORY: Chronic  PRECAUTIONS: DM II, L ACL repair, intellectual disability  SUBJECTIVE:  Pt presents to PT with increased pain in her lower back. Notes that she walked a lot yesterday and has increased pain from that. Pt is ready to begin PT at this time.   PAIN:  Are  you having pain?  Yes: NPRS scale: 7/10 (7/10 at worst) Pain location: lower back Pain description: sore Aggravating factors: prolonged sitting, prolonged standing, walking Relieving factors: icy-hot   OBJECTIVE: (objective measures completed at initial evaluation unless otherwise dated)  PATIENT SURVEYS:  FOTO: 30% function; 44% predicted   LUMBAR ROM:    Active  A/PROM  eval  Flexion WFL  Extension WFL   (Blank rows = not tested)   LE MMT:   MMT Right 01/16/2022 Left 01/16/2022  Hip flexion       Hip extension      Hip abduction      Hip adduction      Hip external rotation      Hip internal rotation      Knee extension      Knee flexion      Ankle dorsiflexion       Ankle plantarflexion      Ankle inversion      Ankle eversion      Grossly 4/5 4/5  (Blank rows = not tested)   LUMBAR SPECIAL TESTS:  Straight leg raise test: Negative and Slump test: Negative   FUNCTIONAL TESTS:  30 Second Sit to Stand: 13 reps Plank hold time: 3 sec - on hands   TODAY'S TREATMENT  [facility] Adult PT Treatment:  DATE: 02/01/2022 Therapeutic Exercise: POE x 60" Prone press up x 10  Plank from knees 2x15" Bridge 2x10 - 3" hold STS 2x10 15# KB LTR x 10  SLR 2x10 each Supine bicycle 2x10 Modified side plank 2x10" each Seated horizontal abd 2x10 GTB  [facility] Adult PT Treatment:                                                DATE: 01/30/2022 Therapeutic Exercise: POE x 60" Prone press up x 10  Plank from knees 2x15" Bridge 2x10 - 5" hold SLR 2x15 each STS 2x10 - no UE support  Deadbug x 10 Supine bicycle x 10  [facility] Adult PT Treatment:                                                DATE: 01/16/2022 Therapeutic Exercise: POE x 60" Bridge x 5 SLR x 10 L   PATIENT EDUCATION:  Education details: eval findings, FOTO, HEP, POC Person educated: Patient Education method: Explanation, Demonstration, and Handouts Education  comprehension: verbalized understanding and returned demonstration   HOME EXERCISE PROGRAM: Access Code: [email or web address] URL: [email or web address] Date: 01/16/2022 Prepared by: Octavio Manns   Exercises - Static Prone on Elbows  - 1 x daily - 7 x weekly - 2 reps - 60 secs hold - Supine Bridge  - 1 x daily - 7 x weekly - 2 sets - 10 reps - 3 sec hold - Active Straight Leg Raise with Quad Set  - 1 x daily - 7 x weekly - 3 sets - 10 reps - Sit to Stand Without Arm Support  - 1 x daily - 7 x weekly - 3 sets - 10 reps - Supine Hamstring Stretch with Strap  - 1 x daily - 7 x weekly - 2-3 reps - 30 sec hold   ASSESSMENT:   CLINICAL IMPRESSION: Pt was able to complete all prescribed exercises with no adverse effect or increase in pain. Therapy focused on improving core and proximal hip strength in order to decrease pain and improve mobility. She continues to improve core endurance and will continue to be seen and progressed as able.      OBJECTIVE IMPAIRMENTS decreased activity tolerance, decreased endurance, decreased mobility, decreased ROM, decreased strength, and pain.    ACTIVITY LIMITATIONS carrying, lifting, bending, and transfers   PARTICIPATION LIMITATIONS: meal prep, cleaning, laundry, medication management, and yard work   [place], Time since onset of injury/illness/exacerbation, and 1-2 comorbidities: DM II, L ACL repair, intellectual disability  are also affecting patient's functional outcome.      GOALS: Goals reviewed with patient? No   SHORT TERM GOALS: Target date: 02/06/2022   Pt will be compliant and knowledgeable with initial HEP for improved comfort and carryover Baseline: initial HEP given Goal status: INITIAL   2.  Pt will self report lower back and L LE pain no greater than 5/10 for improved comfort and functional ability Baseline: 7/10 at worst Goal status: INITIAL   LONG TERM GOALS: Target date: 03/13/2022   Pt will self report  lower back and L LE pain no greater than 2/10 for improved comfort and functional ability Baseline: 7/10 at worst  Goal status: INITIAL  2.  Pt will improve FOTO function score to no less than 44% as proxy for functional improvement Baseline: 30% function Goal status: INITIAL   3.  Pt will increase 30 Second Sit to Stand rep count to no less than 13 reps for improved balance, strength, and functional mobility Baseline: 11 reps  Goal status: INITIAL   4.  Pt will be able to hold a static plank for no less than 15 seconds for improved core endurance and functional ability Baseline: 3 seconds Goal status: INITIAL   PLAN: PT FREQUENCY: 1-2x/week   PT DURATION: 8 weeks   PLANNED INTERVENTIONS: Therapeutic exercises, Therapeutic activity, Neuromuscular re-education, Balance training, Gait training, Patient/Family education, Joint mobilization, Dry Needling, Electrical stimulation, Cryotherapy, Moist heat, Manual therapy, and Re-evaluation.   PLAN FOR NEXT SESSION: assess HEP response, progress core and proximal hip strength as able    Ward Chatters, PT 02/01/2022, 11:25 AM

## 2022-02-06 ENCOUNTER — Ambulatory Visit: Payer: Medicare Other

## 2022-02-06 DIAGNOSIS — E785 Hyperlipidemia, unspecified: Secondary | ICD-10-CM | POA: Diagnosis not present

## 2022-02-06 DIAGNOSIS — J301 Allergic rhinitis due to pollen: Secondary | ICD-10-CM | POA: Diagnosis not present

## 2022-02-06 DIAGNOSIS — Z0000 Encounter for general adult medical examination without abnormal findings: Secondary | ICD-10-CM | POA: Diagnosis not present

## 2022-02-06 DIAGNOSIS — G4753 Recurrent isolated sleep paralysis: Secondary | ICD-10-CM | POA: Diagnosis not present

## 2022-02-06 DIAGNOSIS — G4733 Obstructive sleep apnea (adult) (pediatric): Secondary | ICD-10-CM | POA: Diagnosis not present

## 2022-02-06 DIAGNOSIS — E1169 Type 2 diabetes mellitus with other specified complication: Secondary | ICD-10-CM | POA: Diagnosis not present

## 2022-02-08 ENCOUNTER — Ambulatory Visit: Payer: Medicare Other

## 2022-02-08 DIAGNOSIS — M25562 Pain in left knee: Secondary | ICD-10-CM | POA: Diagnosis not present

## 2022-02-08 DIAGNOSIS — G8929 Other chronic pain: Secondary | ICD-10-CM

## 2022-02-08 DIAGNOSIS — M6281 Muscle weakness (generalized): Secondary | ICD-10-CM

## 2022-02-08 NOTE — Therapy (Signed)
OUTPATIENT PHYSICAL THERAPY TREATMENT NOTE   Patient Name: Cindy Phillips MRN: [id number] DOB:[birth year], 31 y.o., female Today's Date: 02/08/2022  PCP: Lucianne Lei, MD REFERRING PROVIDER: Jessy Oto, MD  END OF SESSION:   PT End of Session - 02/08/22 1448     Visit Number 4    Number of Visits 17    Date for PT Re-Evaluation 03/13/22    Authorization Type [employer] Medicare    PT Start Time 1448    PT Stop Time 1518    PT Time Calculation (min) 30 min    Activity Tolerance Patient tolerated treatment well    Behavior During Therapy WFL for tasks assessed/performed               Past Medical History:  Diagnosis Date   Bipolar 1 disorder ([place])    Bowel incontinence    Depression    Diabetes mellitus without complication ([place])    Mental disability    Past Surgical History:  Procedure Laterality Date   FOOT SURGERY     KNEE ARTHROSCOPY W/ ACL RECONSTRUCTION Left 10/12/2020   Patient Active Problem List   Diagnosis Date Noted   Pain in left elbow 07/27/2020   Prediabetes 02/03/2019   Obstructive sleep apnea 04/23/2018   Other fatigue 11/26/2017   Shortness of breath on exertion 11/26/2017   Type 2 diabetes mellitus without complication, without long-term current use of insulin ([place]) 11/26/2017   Other hyperlipidemia 11/26/2017   Vitamin D deficiency 11/26/2017   Schizoaffective disorder, bipolar type ([place]) 09/14/2014   Intellectual disability 09/10/2014    REFERRING DIAG: M47.26 (ICD-10-CM) - Other spondylosis with radiculopathy, lumbar region  THERAPY DIAG:  Muscle weakness (generalized)  Chronic pain of left knee  Rationale for Evaluation and Treatment Rehabilitation  PERTINENT HISTORY: Chronic  PRECAUTIONS: DM II, L ACL repair, intellectual disability  SUBJECTIVE:  Pt presents to PT with continued LBP. Has been compliant   PAIN:  Are you having pain?  Yes: NPRS scale: 5/10 (7/10 at worst) Pain location: lower back Pain description:  sore Aggravating factors: prolonged sitting, prolonged standing, walking Relieving factors: icy-hot   OBJECTIVE: (objective measures completed at initial evaluation unless otherwise dated)  PATIENT SURVEYS:  FOTO: 30% function; 44% predicted   LUMBAR ROM:    Active  A/PROM  eval  Flexion WFL  Extension WFL   (Blank rows = not tested)   LE MMT:   MMT Right 01/16/2022 Left 01/16/2022  Hip flexion       Hip extension      Hip abduction      Hip adduction      Hip external rotation      Hip internal rotation      Knee extension      Knee flexion      Ankle dorsiflexion       Ankle plantarflexion      Ankle inversion      Ankle eversion      Grossly 4/5 4/5  (Blank rows = not tested)   LUMBAR SPECIAL TESTS:  Straight leg raise test: Negative and Slump test: Negative   FUNCTIONAL TESTS:  30 Second Sit to Stand: 13 reps Plank hold time: 3 sec - on hands   TODAY'S TREATMENT  [facility] Adult PT Treatment:  DATE: 02/08/2022 Therapeutic Exercise: POE x 60" Prone press up x 10  Bridge 2x10 - 3" hold STS 2x10 15# KB LTR x 10  SLR 2x15 each Supine bicycle 2x10 Prone superman x 10 Prone hip ext x 10 each  [facility] Adult PT Treatment:                                                DATE: 02/01/2022 Therapeutic Exercise: POE x 60" Prone press up x 10  Plank from knees 2x15" Bridge 2x10 - 3" hold STS 2x10 15# KB LTR x 10  SLR 2x10 each Supine bicycle 2x10 Modified side plank 2x10" each Seated horizontal abd 2x10 GTB  [facility] Adult PT Treatment:                                                DATE: 01/30/2022 Therapeutic Exercise: POE x 60" Prone press up x 10  Plank from knees 2x15" Bridge 2x10 - 5" hold SLR 2x15 each STS 2x10 - no UE support  Deadbug x 10 Supine bicycle x 10  PATIENT EDUCATION:  Education details: eval findings, FOTO, HEP, POC Person educated: Patient Education method: Explanation, Demonstration, and  Handouts Education comprehension: verbalized understanding and returned demonstration   HOME EXERCISE PROGRAM: Access Code: [email or web address] URL: [email or web address] Date: 01/16/2022 Prepared by: Octavio Manns   Exercises - Static Prone on Elbows  - 1 x daily - 7 x weekly - 2 reps - 60 secs hold - Supine Bridge  - 1 x daily - 7 x weekly - 2 sets - 10 reps - 3 sec hold - Active Straight Leg Raise with Quad Set  - 1 x daily - 7 x weekly - 3 sets - 10 reps - Sit to Stand Without Arm Support  - 1 x daily - 7 x weekly - 3 sets - 10 reps - Supine Hamstring Stretch with Strap  - 1 x daily - 7 x weekly - 2-3 reps - 30 sec hold   ASSESSMENT:   CLINICAL IMPRESSION: Pt was able to complete all prescribed exercises with no adverse effect. Therapy focused on improving core and proximal hip strength in order to decrease pain and improve function. She continues to benefit from skilled PT services and will continue to be seen and progressed as able.      OBJECTIVE IMPAIRMENTS decreased activity tolerance, decreased endurance, decreased mobility, decreased ROM, decreased strength, and pain.    ACTIVITY LIMITATIONS carrying, lifting, bending, and transfers   PARTICIPATION LIMITATIONS: meal prep, cleaning, laundry, medication management, and yard work   [place], Time since onset of injury/illness/exacerbation, and 1-2 comorbidities: DM II, L ACL repair, intellectual disability  are also affecting patient's functional outcome.      GOALS: Goals reviewed with patient? No   SHORT TERM GOALS: Target date: 02/06/2022   Pt will be compliant and knowledgeable with initial HEP for improved comfort and carryover Baseline: initial HEP given Goal status: INITIAL   2.  Pt will self report lower back and L LE pain no greater than 5/10 for improved comfort and functional ability Baseline: 7/10 at worst Goal status: INITIAL   LONG TERM GOALS: Target date: 03/13/2022   Pt will self  report  lower back and L LE pain no greater than 2/10 for improved comfort and functional ability Baseline: 7/10 at worst  Goal status: INITIAL   2.  Pt will improve FOTO function score to no less than 44% as proxy for functional improvement Baseline: 30% function Goal status: INITIAL   3.  Pt will increase 30 Second Sit to Stand rep count to no less than 13 reps for improved balance, strength, and functional mobility Baseline: 11 reps  Goal status: INITIAL   4.  Pt will be able to hold a static plank for no less than 15 seconds for improved core endurance and functional ability Baseline: 3 seconds Goal status: INITIAL   PLAN: PT FREQUENCY: 1-2x/week   PT DURATION: 8 weeks   PLANNED INTERVENTIONS: Therapeutic exercises, Therapeutic activity, Neuromuscular re-education, Balance training, Gait training, Patient/Family education, Joint mobilization, Dry Needling, Electrical stimulation, Cryotherapy, Moist heat, Manual therapy, and Re-evaluation.   PLAN FOR NEXT SESSION: assess HEP response, progress core and proximal hip strength as able    Ward Chatters, PT 02/08/2022, 3:23 PM

## 2022-02-09 NOTE — Procedures (Signed)
Lumbar Epidural Steroid Injection - Interlaminar Approach with Fluoroscopic Guidance  Patient: Cindy Phillips      Date of Birth: Jun 04, 1991 MRN: [id number] PCP: Renaye Rakers, MD      Visit Date: 01/23/2022   Universal Protocol:     Consent Given By: the patient  Position: PRONE  Additional Comments: Vital signs were monitored before and after the procedure. Patient was prepped and draped in the usual sterile fashion. The correct patient, procedure, and site was verified.   Injection Procedure Details:   Procedure diagnoses: Radiculopathy due to lumbar intervertebral disc disorder [M51.16]   Meds Administered:  Meds ordered this encounter  Medications   methylPREDNISolone acetate (DEPO-MEDROL) injection 80 mg     Laterality: Left  Location/Site:  L5-S1  Needle: 4.5 in., 20 ga. Tuohy  Needle Placement: Paramedian epidural  Findings:   -Comments: Excellent flow of contrast into the epidural space.  Procedure Details: Using a paramedian approach from the side mentioned above, the region overlying the inferior lamina was localized under fluoroscopic visualization and the soft tissues overlying this structure were infiltrated with 4 ml. of 1% Lidocaine without Epinephrine. The Tuohy needle was inserted into the epidural space using a paramedian approach.   The epidural space was localized using loss of resistance along with counter oblique bi-planar fluoroscopic views.  After negative aspirate for air, blood, and CSF, a 2 ml. volume of Isovue-250 was injected into the epidural space and the flow of contrast was observed. Radiographs were obtained for documentation purposes.    The injectate was administered into the level noted above.   Additional Comments:  The patient tolerated the procedure well Dressing: 2 x 2 sterile gauze and Band-Aid    Post-procedure details: Patient was observed during the procedure. Post-procedure instructions were reviewed.  Patient  left the clinic in stable condition.

## 2022-02-09 NOTE — Progress Notes (Signed)
Cindy Phillips - 31 y.o. female MRN [id number]  Date of birth: 1991/01/02  Office Visit Note: Visit Date: 01/23/2022 PCP: Renaye Rakers, MD Referred by: Renaye Rakers, MD  Subjective: Chief Complaint  Patient presents with   Lower Back - Pain   Left Leg - Pain   HPI:  Cindy Phillips is a 31 y.o. female who comes in today at the request of Dr. Vira Browns for planned Left L5-S1 Lumbar Interlaminar epidural steroid injection with fluoroscopic guidance.  The patient has failed conservative care including home exercise, medications, time and activity modification.  This injection will be diagnostic and hopefully therapeutic.  Please see requesting physician notes for further details and justification.  There appeared to be some confusion on the notes with Dr. Otelia Sergeant in relation to previous injection.  She in fact never received the epidural injection.  I do think her symptoms fit more closely with nerve root pain and facet joint pain.  We will provide the epidural injection today and can follow-up with her if facet joint injection needed diagnostically. Pt. States she has lower back pain that travels down her left leg. Pt state walking makes the pain worse.   ROS Otherwise per HPI.  Assessment & Plan: Visit Diagnoses:    ICD-10-CM   1. Radiculopathy due to lumbar intervertebral disc disorder  M51.16 XR C-ARM NO REPORT    methylPREDNISolone acetate (DEPO-MEDROL) injection 80 mg    Epidural Steroid injection    2. Spondylosis without myelopathy or radiculopathy, lumbar region  M47.816 CANCELED: Facet Injection      Plan: No additional findings.   Meds & Orders:  Meds ordered this encounter  Medications   methylPREDNISolone acetate (DEPO-MEDROL) injection 80 mg    Orders Placed This Encounter  Procedures   XR C-ARM NO REPORT   Epidural Steroid injection    Follow-up: Return for visit to requesting provider as needed.   Procedures: No procedures performed  Lumbar Epidural  Steroid Injection - Interlaminar Approach with Fluoroscopic Guidance  Patient: Cindy Phillips      Date of Birth: 01-Jan-1991 MRN: [id number] PCP: Renaye Rakers, MD      Visit Date: 01/23/2022   Universal Protocol:     Consent Given By: the patient  Position: PRONE  Additional Comments: Vital signs were monitored before and after the procedure. Patient was prepped and draped in the usual sterile fashion. The correct patient, procedure, and site was verified.   Injection Procedure Details:   Procedure diagnoses: Radiculopathy due to lumbar intervertebral disc disorder [M51.16]   Meds Administered:  Meds ordered this encounter  Medications   methylPREDNISolone acetate (DEPO-MEDROL) injection 80 mg     Laterality: Left  Location/Site:  L5-S1  Needle: 4.5 in., 20 ga. Tuohy  Needle Placement: Paramedian epidural  Findings:   -Comments: Excellent flow of contrast into the epidural space.  Procedure Details: Using a paramedian approach from the side mentioned above, the region overlying the inferior lamina was localized under fluoroscopic visualization and the soft tissues overlying this structure were infiltrated with 4 ml. of 1% Lidocaine without Epinephrine. The Tuohy needle was inserted into the epidural space using a paramedian approach.   The epidural space was localized using loss of resistance along with counter oblique bi-planar fluoroscopic views.  After negative aspirate for air, blood, and CSF, a 2 ml. volume of Isovue-250 was injected into the epidural space and the flow of contrast was observed. Radiographs were obtained for documentation purposes.    The  injectate was administered into the level noted above.   Additional Comments:  The patient tolerated the procedure well Dressing: 2 x 2 sterile gauze and Band-Aid    Post-procedure details: Patient was observed during the procedure. Post-procedure instructions were reviewed.  Patient left the clinic in  stable condition.   Clinical History: No specialty comments available.     Objective:  VS:  HT:    WT:   BMI:     BP:132/76  HR:94bpm  TEMP: ( )  RESP:  Physical Exam Vitals and nursing note reviewed.  Constitutional:      General: She is not in acute distress.    Appearance: Normal appearance. She is obese. She is not ill-appearing.  HENT:     Head: Normocephalic and atraumatic.     Right Ear: External ear normal.     Left Ear: External ear normal.  Eyes:     Extraocular Movements: Extraocular movements intact.  Cardiovascular:     Rate and Rhythm: Normal rate.     Pulses: Normal pulses.  Pulmonary:     Effort: Pulmonary effort is normal. No respiratory distress.  Abdominal:     General: There is no distension.     Palpations: Abdomen is soft.  Musculoskeletal:        General: Tenderness present.     Cervical back: Neck supple.     Right lower leg: No edema.     Left lower leg: No edema.     Comments: Patient has good distal strength with no pain over the greater trochanters.  No clonus or focal weakness.  Skin:    Findings: No erythema, lesion or rash.  Neurological:     General: No focal deficit present.     Mental Status: She is alert and oriented to person, place, and time.     Sensory: No sensory deficit.     Motor: No weakness or abnormal muscle tone.     Coordination: Coordination normal.  Psychiatric:        Mood and Affect: Mood normal.        Behavior: Behavior normal.      Imaging: No results found.

## 2022-02-12 ENCOUNTER — Encounter: Payer: Self-pay | Admitting: Specialist

## 2022-02-12 ENCOUNTER — Ambulatory Visit (INDEPENDENT_AMBULATORY_CARE_PROVIDER_SITE_OTHER): Payer: Medicare Other | Admitting: Specialist

## 2022-02-12 VITALS — BP 124/80 | HR 126 | Ht 71.0 in | Wt 306.0 lb

## 2022-02-12 DIAGNOSIS — M5116 Intervertebral disc disorders with radiculopathy, lumbar region: Secondary | ICD-10-CM

## 2022-02-12 DIAGNOSIS — S39012A Strain of muscle, fascia and tendon of lower back, initial encounter: Secondary | ICD-10-CM

## 2022-02-12 DIAGNOSIS — M4726 Other spondylosis with radiculopathy, lumbar region: Secondary | ICD-10-CM | POA: Diagnosis not present

## 2022-02-12 DIAGNOSIS — M51379 Other intervertebral disc degeneration, lumbosacral region without mention of lumbar back pain or lower extremity pain: Secondary | ICD-10-CM

## 2022-02-12 DIAGNOSIS — M545 Low back pain, unspecified: Secondary | ICD-10-CM

## 2022-02-12 DIAGNOSIS — M47816 Spondylosis without myelopathy or radiculopathy, lumbar region: Secondary | ICD-10-CM

## 2022-02-12 DIAGNOSIS — M5137 Other intervertebral disc degeneration, lumbosacral region: Secondary | ICD-10-CM

## 2022-02-12 NOTE — Progress Notes (Signed)
Office Visit Note   Patient: Cindy Phillips           Date of Birth: Jan 17, 1991           MRN: [id number] Visit Date: 02/12/2022              Requested by: Renaye Rakers, MD [id number] [place],  [place] [address] PCP: Renaye Rakers, MD   Assessment & Plan: Visit Diagnoses:  1. Radiculopathy due to lumbar intervertebral disc disorder   2. Spondylosis without myelopathy or radiculopathy, lumbar region   3. Other spondylosis with radiculopathy, lumbar region   4. Disc disease, degenerative, lumbar or lumbosacral   5. Strain of lumbar region, initial encounter   6. Acute low back pain, unspecified back pain laterality, unspecified whether sciatica present     Plan: Avoid frequent bending and stooping  No lifting greater than 10 lbs. May use ice or moist heat for pain. Weight loss is of benefit.The findings of degenerative disc disease on MRI are extremely subtle and suggest that L5-S1 is likely the source of your pain but the indications that surgery will be able to relieve the pain are not clearly present. I would recommend conservative treatment with a continued exercise program and weight loss. Exercise is important to improve your indurance and does allow people to function better inspite of back pain. In the future the findings may change and it may then become clear that intervention would be of benefit But presently there are too little findings to warranted surgery in the form of a lumbar fusion, I am concerned that the affect of surgery may be more a cause for pain and the resulting change would be no Significant improvement in pain at all.     Follow-Up Instructions: Return in about 4 weeks (around 03/12/2022).   Orders:  No orders of the defined types were placed in this encounter.  No orders of the defined types were placed in this encounter.     Procedures: No procedures performed   Clinical Data: No additional findings.   Subjective: Chief Complaint   Patient presents with   Lower Back - Follow-up    Had Left L5-S1 IL injection     31 year old female and she is in PT for her lumbar spine 2 x per week. No bowel or bladder difficulty. Has findings of spondylosis of the lumbar spine. Had Epidural steroid injection left L5-S1 IL. She reports that the injection was not that helpful and she is off and on having pain, today it is a "2" on a scale of 1-10.    Review of Systems  Constitutional: Negative.   HENT: Negative.    Eyes: Negative.   Respiratory: Negative.    Cardiovascular: Negative.   Gastrointestinal: Negative.   Endocrine: Negative.   Genitourinary: Negative.   Musculoskeletal: Negative.   Skin: Negative.   Allergic/Immunologic: Negative.   Neurological: Negative.   Hematological: Negative.   Psychiatric/Behavioral: Negative.       Objective: Vital Signs: BP 124/80 (BP Location: Left Arm, Patient Position: Sitting)   Pulse (!) 126   Ht 5\' 11"  (1.803 m)   Wt (!) 306 lb (138.8 kg)   BMI 42.68 kg/m   Physical Exam Constitutional:      Appearance: She is well-developed.  HENT:     Head: Normocephalic and atraumatic.  Eyes:     Pupils: Pupils are equal, round, and reactive to light.  Pulmonary:     Effort:  Pulmonary effort is normal.     Breath sounds: Normal breath sounds.  Abdominal:     General: Bowel sounds are normal.     Palpations: Abdomen is soft.  Musculoskeletal:        General: Normal range of motion.     Cervical back: Normal range of motion and neck supple.  Skin:    General: Skin is warm and dry.  Neurological:     Mental Status: She is alert and oriented to person, place, and time.  Psychiatric:        Behavior: Behavior normal.        Thought Content: Thought content normal.        Judgment: Judgment normal.    Ortho Exam  Specialty Comments:  No specialty comments available.  Imaging: No results found.   PMFS History: Patient Active Problem List   Diagnosis Date Noted    Pain in left elbow 07/27/2020   Prediabetes 02/03/2019   Obstructive sleep apnea 04/23/2018   Other fatigue 11/26/2017   Shortness of breath on exertion 11/26/2017   Type 2 diabetes mellitus without complication, without long-term current use of insulin ([facility]) 11/26/2017   Other hyperlipidemia 11/26/2017   Vitamin D deficiency 11/26/2017   Schizoaffective disorder, bipolar type ([facility]) 09/14/2014   Intellectual disability 09/10/2014   Past Medical History:  Diagnosis Date   Bipolar 1 disorder ([facility])    Bowel incontinence    Depression    Diabetes mellitus without complication ([facility])    Mental disability     Family History  Problem Relation Age of Onset   High blood pressure Mother    Anxiety disorder Mother    Diabetes Maternal Grandmother     Past Surgical History:  Procedure Laterality Date   FOOT SURGERY     KNEE ARTHROSCOPY W/ ACL RECONSTRUCTION Left 10/12/2020   Social History   Occupational History   Occupation: Nature conservation officer     Comment: [employer]   Tobacco Use   Smoking status: Never   Smokeless tobacco: Never  Vaping Use   Vaping Use: Never used  Substance and Sexual Activity   Alcohol use: No   Drug use: No   Sexual activity: Not Currently

## 2022-02-14 ENCOUNTER — Other Ambulatory Visit: Payer: Self-pay | Admitting: *Deleted

## 2022-02-14 NOTE — Patient Outreach (Signed)
[facility]) Care Management  02/14/2022  Cindy Phillips 1990/09/16 [id number]   RN Health Coach telephone call to patient.  [employer] compliance verified. Patient A1C is maintaining at 5.8. Patient has met her goal.  Plan: Case Closure Case Closure letter sent to PCP Case Closure Letter sent to Patient Certificate of Completion  [place] Management [phone]

## 2022-02-15 ENCOUNTER — Ambulatory Visit: Payer: Medicare Other

## 2022-02-15 DIAGNOSIS — M25562 Pain in left knee: Secondary | ICD-10-CM | POA: Diagnosis not present

## 2022-02-15 DIAGNOSIS — M6281 Muscle weakness (generalized): Secondary | ICD-10-CM

## 2022-02-15 DIAGNOSIS — G8929 Other chronic pain: Secondary | ICD-10-CM | POA: Diagnosis not present

## 2022-02-15 NOTE — Therapy (Signed)
OUTPATIENT PHYSICAL THERAPY TREATMENT NOTE   Patient Name: Cindy Phillips MRN: [id number] DOB:1990/11/14, 31 y.o., female Today's Date: 02/15/2022  PCP: Lucianne Lei, MD REFERRING PROVIDER: Jessy Oto, MD  END OF SESSION:   PT End of Session - 02/15/22 1338     Visit Number 5    Number of Visits 17    Date for PT Re-Evaluation 03/13/22    Authorization Type [employer] Medicare    PT Start Time 8341    PT Stop Time 1425    PT Time Calculation (min) 40 min    Activity Tolerance Patient tolerated treatment well    Behavior During Therapy WFL for tasks assessed/performed                Past Medical History:  Diagnosis Date   Bipolar 1 disorder ([place])    Bowel incontinence    Depression    Diabetes mellitus without complication ([place])    Mental disability    Past Surgical History:  Procedure Laterality Date   FOOT SURGERY     KNEE ARTHROSCOPY W/ ACL RECONSTRUCTION Left 10/12/2020   Patient Active Problem List   Diagnosis Date Noted   Pain in left elbow 07/27/2020   Prediabetes 02/03/2019   Obstructive sleep apnea 04/23/2018   Other fatigue 11/26/2017   Shortness of breath on exertion 11/26/2017   Type 2 diabetes mellitus without complication, without long-term current use of insulin ([place]) 11/26/2017   Other hyperlipidemia 11/26/2017   Vitamin D deficiency 11/26/2017   Schizoaffective disorder, bipolar type ([place]) 09/14/2014   Intellectual disability 09/10/2014    REFERRING DIAG: M47.26 (ICD-10-CM) - Other spondylosis with radiculopathy, lumbar region  THERAPY DIAG:  Muscle weakness (generalized)  Chronic pain of left knee  Rationale for Evaluation and Treatment Rehabilitation  PERTINENT HISTORY: Chronic  PRECAUTIONS: DM II, L ACL repair, intellectual disability  SUBJECTIVE:  Pt presents to PT with continued LBP. Pt notes that she has not been sleeping well over the last few weeks. Has been compliant with HEP with no adverse effect. Pt is ready to begin  PT at this time.   PAIN:  Are you having pain?  Yes: NPRS scale: 7/10 (7/10 at worst) Pain location: lower back Pain description: sore Aggravating factors: prolonged sitting, prolonged standing, walking Relieving factors: icy-hot   OBJECTIVE: (objective measures completed at initial evaluation unless otherwise dated)  PATIENT SURVEYS:  FOTO: 30% function; 44% predicted   LUMBAR ROM:    Active  A/PROM  eval  Flexion WFL  Extension WFL   (Blank rows = not tested)   LE MMT:   MMT Right 01/16/2022 Left 01/16/2022  Hip flexion       Hip extension      Hip abduction      Hip adduction      Hip external rotation      Hip internal rotation      Knee extension      Knee flexion      Ankle dorsiflexion       Ankle plantarflexion      Ankle inversion      Ankle eversion      Grossly 4/5 4/5  (Blank rows = not tested)   LUMBAR SPECIAL TESTS:  Straight leg raise test: Negative and Slump test: Negative   FUNCTIONAL TESTS:  30 Second Sit to Stand: 13 reps Plank hold time: 3 sec - on hands   TODAY'S TREATMENT  [facility] Adult PT Treatment:  DATE: 02/15/2022 Therapeutic Exercise: POE x 60" Prone press up x 10  Prone superman x 6 Bridge 2x10 - 3" hold STS 2x10 15# KB LTR x 10  SLR 2x10 each LAQ 2x10 6# Supine bicycle 2x10 Prone hip ext x 10 each  [facility] Adult PT Treatment:                                                DATE: 02/08/2022 Therapeutic Exercise: POE x 60" Prone press up x 10  Bridge 2x10 - 3" hold STS 2x10 15# KB LTR x 10  SLR 2x15 each Supine bicycle 2x10 Prone superman x 10 Prone hip ext x 10 each  [facility] Adult PT Treatment:                                                DATE: 02/01/2022 Therapeutic Exercise: POE x 60" Prone press up x 10  Plank from knees 2x15" Bridge 2x10 - 3" hold STS 2x10 15# KB LTR x 10  SLR 2x10 each Supine bicycle 2x10 Modified side plank 2x10" each Seated horizontal abd 2x10  GTB  [facility] Adult PT Treatment:                                                DATE: 01/30/2022 Therapeutic Exercise: POE x 60" Prone press up x 10  Plank from knees 2x15" Bridge 2x10 - 5" hold SLR 2x15 each STS 2x10 - no UE support  Deadbug x 10 Supine bicycle x 10  PATIENT EDUCATION:  Education details: eval findings, FOTO, HEP, POC Person educated: Patient Education method: Explanation, Demonstration, and Handouts Education comprehension: verbalized understanding and returned demonstration   HOME EXERCISE PROGRAM: Access Code: [email or web address] URL: [email or web address] Date: 01/16/2022 Prepared by: Octavio Manns   Exercises - Static Prone on Elbows  - 1 x daily - 7 x weekly - 2 reps - 60 secs hold - Supine Bridge  - 1 x daily - 7 x weekly - 2 sets - 10 reps - 3 sec hold - Active Straight Leg Raise with Quad Set  - 1 x daily - 7 x weekly - 3 sets - 10 reps - Sit to Stand Without Arm Support  - 1 x daily - 7 x weekly - 3 sets - 10 reps - Supine Hamstring Stretch with Strap  - 1 x daily - 7 x weekly - 2-3 reps - 30 sec hold   ASSESSMENT:   CLINICAL IMPRESSION: Pt was able to complete prescribed exercises with no adverse effect. Therapy focused on improving core and proximal hip strength with pt noting decreased pain post session. Pt is progressing well with therapy and will continue to be seen and progressed as tolerated.      OBJECTIVE IMPAIRMENTS decreased activity tolerance, decreased endurance, decreased mobility, decreased ROM, decreased strength, and pain.    ACTIVITY LIMITATIONS carrying, lifting, bending, and transfers   PARTICIPATION LIMITATIONS: meal prep, cleaning, laundry, medication management, and yard work   [place], Time since onset of injury/illness/exacerbation, and 1-2 comorbidities: DM II, L ACL repair, intellectual disability  are  also affecting patient's functional outcome.      GOALS: Goals reviewed with patient? No   SHORT  TERM GOALS: Target date: 02/06/2022   Pt will be compliant and knowledgeable with initial HEP for improved comfort and carryover Baseline: initial HEP given Goal status: INITIAL   2.  Pt will self report lower back and L LE pain no greater than 5/10 for improved comfort and functional ability Baseline: 7/10 at worst Goal status: INITIAL   LONG TERM GOALS: Target date: 03/13/2022   Pt will self report lower back and L LE pain no greater than 2/10 for improved comfort and functional ability Baseline: 7/10 at worst  Goal status: INITIAL   2.  Pt will improve FOTO function score to no less than 44% as proxy for functional improvement Baseline: 30% function Goal status: INITIAL   3.  Pt will increase 30 Second Sit to Stand rep count to no less than 13 reps for improved balance, strength, and functional mobility Baseline: 11 reps  Goal status: INITIAL   4.  Pt will be able to hold a static plank for no less than 15 seconds for improved core endurance and functional ability Baseline: 3 seconds Goal status: INITIAL   PLAN: PT FREQUENCY: 1-2x/week   PT DURATION: 8 weeks   PLANNED INTERVENTIONS: Therapeutic exercises, Therapeutic activity, Neuromuscular re-education, Balance training, Gait training, Patient/Family education, Joint mobilization, Dry Needling, Electrical stimulation, Cryotherapy, Moist heat, Manual therapy, and Re-evaluation.   PLAN FOR NEXT SESSION: assess HEP response, progress core and proximal hip strength as able    Ward Chatters, PT 02/15/2022, 2:35 PM

## 2022-02-16 DIAGNOSIS — E1165 Type 2 diabetes mellitus with hyperglycemia: Secondary | ICD-10-CM | POA: Diagnosis not present

## 2022-02-16 DIAGNOSIS — E785 Hyperlipidemia, unspecified: Secondary | ICD-10-CM | POA: Diagnosis not present

## 2022-02-19 ENCOUNTER — Ambulatory Visit: Payer: Medicare Other | Admitting: Specialist

## 2022-02-19 ENCOUNTER — Ambulatory Visit: Payer: Medicare Other | Attending: Specialist

## 2022-02-19 DIAGNOSIS — M6281 Muscle weakness (generalized): Secondary | ICD-10-CM

## 2022-02-19 DIAGNOSIS — R2689 Other abnormalities of gait and mobility: Secondary | ICD-10-CM | POA: Insufficient documentation

## 2022-02-19 DIAGNOSIS — M5459 Other low back pain: Secondary | ICD-10-CM | POA: Insufficient documentation

## 2022-02-19 DIAGNOSIS — G8929 Other chronic pain: Secondary | ICD-10-CM | POA: Diagnosis not present

## 2022-02-19 DIAGNOSIS — M25562 Pain in left knee: Secondary | ICD-10-CM | POA: Diagnosis not present

## 2022-02-19 NOTE — Therapy (Signed)
OUTPATIENT PHYSICAL THERAPY TREATMENT NOTE   Patient Name: Cindy Phillips MRN: [id number] DOB:12/14/1990, 31 y.o., female Today's Date: 02/19/2022  PCP: Lucianne Lei, MD REFERRING PROVIDER: Jessy Oto, MD  END OF SESSION:   PT End of Session - 02/19/22 1347     Visit Number 6    Number of Visits 17    Date for PT Re-Evaluation 03/13/22    Authorization Type [employer] Medicare    PT Start Time 1355    PT Stop Time 1435    PT Time Calculation (min) 40 min    Activity Tolerance Patient tolerated treatment well    Behavior During Therapy WFL for tasks assessed/performed                 Past Medical History:  Diagnosis Date   Bipolar 1 disorder ([place])    Bowel incontinence    Depression    Diabetes mellitus without complication ([place])    Mental disability    Past Surgical History:  Procedure Laterality Date   FOOT SURGERY     KNEE ARTHROSCOPY W/ ACL RECONSTRUCTION Left 10/12/2020   Patient Active Problem List   Diagnosis Date Noted   Pain in left elbow 07/27/2020   Prediabetes 02/03/2019   Obstructive sleep apnea 04/23/2018   Other fatigue 11/26/2017   Shortness of breath on exertion 11/26/2017   Type 2 diabetes mellitus without complication, without long-term current use of insulin ([place]) 11/26/2017   Other hyperlipidemia 11/26/2017   Vitamin D deficiency 11/26/2017   Schizoaffective disorder, bipolar type ([place]) 09/14/2014   Intellectual disability 09/10/2014    REFERRING DIAG: M47.26 (ICD-10-CM) - Other spondylosis with radiculopathy, lumbar region  THERAPY DIAG:  Muscle weakness (generalized)  Chronic pain of left knee  Rationale for Evaluation and Treatment Rehabilitation  PERTINENT HISTORY: Chronic  PRECAUTIONS: DM II, L ACL repair, intellectual disability  SUBJECTIVE:  Pt presents to PT with report of slightly decreased pain. She has been compliant with HEP with no adverse effect. Pt is ready to begin PT at this time.   PAIN:  Are you having  pain?  Yes: NPRS scale: 4/10 (7/10 at worst) Pain location: lower back Pain description: sore Aggravating factors: prolonged sitting, prolonged standing, walking Relieving factors: icy-hot   OBJECTIVE: (objective measures completed at initial evaluation unless otherwise dated)  PATIENT SURVEYS:  FOTO: 30% function; 44% predicted   LUMBAR ROM:    Active  A/PROM  eval  Flexion WFL  Extension WFL   (Blank rows = not tested)   LE MMT:   MMT Right 01/16/2022 Left 01/16/2022  Hip flexion       Hip extension      Hip abduction      Hip adduction      Hip external rotation      Hip internal rotation      Knee extension      Knee flexion      Ankle dorsiflexion       Ankle plantarflexion      Ankle inversion      Ankle eversion      Grossly 4/5 4/5  (Blank rows = not tested)   LUMBAR SPECIAL TESTS:  Straight leg raise test: Negative and Slump test: Negative   FUNCTIONAL TESTS:  30 Second Sit to Stand: 13 reps Plank hold time: 3 sec - on hands   TODAY'S TREATMENT  [facility] Adult PT Treatment:  DATE: 02/19/2022 Therapeutic Exercise: POE x 60" Prone press up x 10  Bridge 2x15 - 3" hold STS 2x10 15# KB LTR x 10  SLR 2x10 each LAQ 3x15 7.5# L  Supine bicycle 3x10" Prone hip ext x 10 each Prone hamstring curl 2x10 each  [facility] Adult PT Treatment:                                                DATE: 02/15/2022 Therapeutic Exercise: POE x 60" Prone press up x 10  Prone superman x 6 Bridge 2x10 - 3" hold STS 2x10 15# KB LTR x 10  SLR 2x10 each LAQ 2x10 6# Supine bicycle 2x10 Prone hip ext x 10 each  [facility] Adult PT Treatment:                                                DATE: 02/08/2022 Therapeutic Exercise: POE x 60" Prone press up x 10  Bridge 2x10 - 3" hold STS 2x10 15# KB LTR x 10  SLR 2x15 each Supine bicycle 2x10 Prone superman x 10 Prone hip ext x 10 each  PATIENT EDUCATION:  Education details: eval  findings, FOTO, HEP, POC Person educated: Patient Education method: Explanation, Demonstration, and Handouts Education comprehension: verbalized understanding and returned demonstration   HOME EXERCISE PROGRAM: Access Code: [email or web address] URL: [email or web address] Date: 01/16/2022 Prepared by: Octavio Manns   Exercises - Static Prone on Elbows  - 1 x daily - 7 x weekly - 2 reps - 60 secs hold - Supine Bridge  - 1 x daily - 7 x weekly - 2 sets - 10 reps - 3 sec hold - Active Straight Leg Raise with Quad Set  - 1 x daily - 7 x weekly - 3 sets - 10 reps - Sit to Stand Without Arm Support  - 1 x daily - 7 x weekly - 3 sets - 10 reps - Supine Hamstring Stretch with Strap  - 1 x daily - 7 x weekly - 2-3 reps - 30 sec hold   ASSESSMENT:   CLINICAL IMPRESSION: Pt was able to complete prescribed exercises with no adverse effect or increase in pain. Therapy focused on continuing to progress LE and core strength in order to decrease pain and improve comfort. Pt is progressing as expected with therapy and will continue to be seen and progressed as able.      OBJECTIVE IMPAIRMENTS decreased activity tolerance, decreased endurance, decreased mobility, decreased ROM, decreased strength, and pain.    ACTIVITY LIMITATIONS carrying, lifting, bending, and transfers   PARTICIPATION LIMITATIONS: meal prep, cleaning, laundry, medication management, and yard work   [place], Time since onset of injury/illness/exacerbation, and 1-2 comorbidities: DM II, L ACL repair, intellectual disability  are also affecting patient's functional outcome.      GOALS: Goals reviewed with patient? No   SHORT TERM GOALS: Target date: 02/06/2022   Pt will be compliant and knowledgeable with initial HEP for improved comfort and carryover Baseline: initial HEP given Goal status: INITIAL   2.  Pt will self report lower back and L LE pain no greater than 5/10 for improved comfort and functional  ability Baseline: 7/10 at worst Goal status: INITIAL   LONG  TERM GOALS: Target date: 03/13/2022   Pt will self report lower back and L LE pain no greater than 2/10 for improved comfort and functional ability Baseline: 7/10 at worst  Goal status: INITIAL   2.  Pt will improve FOTO function score to no less than 44% as proxy for functional improvement Baseline: 30% function Goal status: INITIAL   3.  Pt will increase 30 Second Sit to Stand rep count to no less than 13 reps for improved balance, strength, and functional mobility Baseline: 11 reps  Goal status: INITIAL   4.  Pt will be able to hold a static plank for no less than 15 seconds for improved core endurance and functional ability Baseline: 3 seconds Goal status: INITIAL   PLAN: PT FREQUENCY: 1-2x/week   PT DURATION: 8 weeks   PLANNED INTERVENTIONS: Therapeutic exercises, Therapeutic activity, Neuromuscular re-education, Balance training, Gait training, Patient/Family education, Joint mobilization, Dry Needling, Electrical stimulation, Cryotherapy, Moist heat, Manual therapy, and Re-evaluation.   PLAN FOR NEXT SESSION: assess HEP response, progress core and proximal hip strength as able    Ward Chatters, PT 02/19/2022, 2:48 PM

## 2022-02-22 ENCOUNTER — Ambulatory Visit: Payer: Medicare Other

## 2022-02-22 DIAGNOSIS — M6281 Muscle weakness (generalized): Secondary | ICD-10-CM

## 2022-02-22 DIAGNOSIS — R2689 Other abnormalities of gait and mobility: Secondary | ICD-10-CM | POA: Diagnosis not present

## 2022-02-22 DIAGNOSIS — M25562 Pain in left knee: Secondary | ICD-10-CM | POA: Diagnosis not present

## 2022-02-22 DIAGNOSIS — M5459 Other low back pain: Secondary | ICD-10-CM

## 2022-02-22 DIAGNOSIS — G8929 Other chronic pain: Secondary | ICD-10-CM | POA: Diagnosis not present

## 2022-02-22 NOTE — Therapy (Addendum)
OUTPATIENT PHYSICAL THERAPY TREATMENT NOTE/DISCHARGE  PHYSICAL THERAPY DISCHARGE SUMMARY  Visits from Start of Care: 7  Current functional level related to goals / functional outcomes: Unable to assess   Remaining deficits: Unable to assess   Education / Equipment: HEP   Patient agrees to discharge. Patient goals were  unable to assess . Patient is being discharged due to not returning since the last visit.   Patient Name: Cindy Phillips MRN: [id number] DOB:01/28/1991, 31 y.o., female Today's Date: 02/22/2022  PCP: Lucianne Lei, MD REFERRING PROVIDER: Jessy Oto, MD  END OF SESSION:   PT End of Session - 02/22/22 1348     Visit Number 7    Number of Visits 17    Date for PT Re-Evaluation 03/13/22    Authorization Type [employer] Medicare    PT Start Time 1400    PT Stop Time 1430    PT Time Calculation (min) 30 min    Activity Tolerance Patient tolerated treatment well    Behavior During Therapy WFL for tasks assessed/performed                  Past Medical History:  Diagnosis Date   Bipolar 1 disorder ([place])    Bowel incontinence    Depression    Diabetes mellitus without complication ([place])    Mental disability    Past Surgical History:  Procedure Laterality Date   FOOT SURGERY     KNEE ARTHROSCOPY W/ ACL RECONSTRUCTION Left 10/12/2020   Patient Active Problem List   Diagnosis Date Noted   Pain in left elbow 07/27/2020   Prediabetes 02/03/2019   Obstructive sleep apnea 04/23/2018   Other fatigue 11/26/2017   Shortness of breath on exertion 11/26/2017   Type 2 diabetes mellitus without complication, without long-term current use of insulin ([place]) 11/26/2017   Other hyperlipidemia 11/26/2017   Vitamin D deficiency 11/26/2017   Schizoaffective disorder, bipolar type ([place]) 09/14/2014   Intellectual disability 09/10/2014    REFERRING DIAG: M47.26 (ICD-10-CM) - Other spondylosis with radiculopathy, lumbar region  THERAPY DIAG:  Muscle weakness  (generalized)  Chronic pain of left knee  Other abnormalities of gait and mobility  Other low back pain  Rationale for Evaluation and Treatment Rehabilitation  PERTINENT HISTORY: Chronic  PRECAUTIONS: DM II, L ACL repair, intellectual disability  SUBJECTIVE:  Pt presents to PT with reports of slight lower back pain. Pt has been compliant with HEP with no adverse effect. Pt is ready to begin PT at this time.   PAIN:  Are you having pain?  Yes: NPRS scale: 4/10 (7/10 at worst) Pain location: lower back Pain description: sore Aggravating factors: prolonged sitting, prolonged standing, walking Relieving factors: icy-hot   OBJECTIVE: (objective measures completed at initial evaluation unless otherwise dated)  PATIENT SURVEYS:  FOTO: 65% function - 02/22/2022   LUMBAR ROM:    Active  A/PROM  eval  Flexion WFL  Extension WFL   (Blank rows = not tested)   LE MMT:   MMT Right 01/16/2022 Left 01/16/2022  Hip flexion       Hip extension      Hip abduction      Hip adduction      Hip external rotation      Hip internal rotation      Knee extension      Knee flexion      Ankle dorsiflexion       Ankle plantarflexion      Ankle inversion  Ankle eversion      Grossly 4/5 4/5  (Blank rows = not tested)   LUMBAR SPECIAL TESTS:  Straight leg raise test: Negative and Slump test: Negative   FUNCTIONAL TESTS:  30 Second Sit to Stand: 12 reps - 02/22/2022 Plank hold time: 10 seconds   TODAY'S TREATMENT  [facility] Adult PT Treatment:                                                DATE: 02/22/2022 Therapeutic Exercise: POE x 60" STS x 10 - no UE support Prone press up x 10  Plank from knees x 15" Modified side plank x 15" each Therapeutic Activity: Assessment of tests/measures, goals, and outcomes  [facility] Adult PT Treatment:                                                DATE: 02/19/2022 Therapeutic Exercise: POE x 60" Prone press up x 10  Bridge 2x15 - 3" hold STS 2x10  15# KB LTR x 10  SLR 2x10 each LAQ 3x15 7.5# L  Supine bicycle 3x10" Prone hip ext x 10 each Prone hamstring curl 2x10 each  [facility] Adult PT Treatment:                                                DATE: 02/15/2022 Therapeutic Exercise: POE x 60" Prone press up x 10  Prone superman x 6 Bridge 2x10 - 3" hold STS 2x10 15# KB LTR x 10  SLR 2x10 each LAQ 2x10 6# Supine bicycle 2x10 Prone hip ext x 10 each  PATIENT EDUCATION:  Education details: eval findings, FOTO, HEP, POC Person educated: Patient Education method: Explanation, Demonstration, and Handouts Education comprehension: verbalized understanding and returned demonstration   HOME EXERCISE PROGRAM: Access Code: [email or web address] URL: [email or web address] Date: 01/16/2022 Prepared by: Octavio Manns   Exercises - Static Prone on Elbows  - 1 x daily - 7 x weekly - 2 reps - 60 secs hold - Supine Bridge  - 1 x daily - 7 x weekly - 2 sets - 10 reps - 3 sec hold - Active Straight Leg Raise with Quad Set  - 1 x daily - 7 x weekly - 3 sets - 10 reps - Sit to Stand Without Arm Support  - 1 x daily - 7 x weekly - 3 sets - 10 reps - Supine Hamstring Stretch with Strap  - 1 x daily - 7 x weekly - 2-3 reps - 30 sec hold   ASSESSMENT:   CLINICAL IMPRESSION: Pt was able to complete all prescribed exercises with no adverse effect or increase in pain. She has progressed well with therapy thus far, showing improving strength and subjective functional ability. She should continue to improve with HEP compliance and will be progressed as able.      OBJECTIVE IMPAIRMENTS decreased activity tolerance, decreased endurance, decreased mobility, decreased ROM, decreased strength, and pain.    ACTIVITY LIMITATIONS carrying, lifting, bending, and transfers   PARTICIPATION LIMITATIONS: meal prep, cleaning, laundry, medication management, and yard work   [place], Time  since onset of injury/illness/exacerbation, and 1-2  comorbidities: DM II, L ACL repair, intellectual disability  are also affecting patient's functional outcome.      GOALS: Goals reviewed with patient? No   SHORT TERM GOALS: Target date: 02/06/2022   Pt will be compliant and knowledgeable with initial HEP for improved comfort and carryover Baseline: initial HEP given Goal status: MET   2.  Pt will self report lower back and L LE pain no greater than 5/10 for improved comfort and functional ability Baseline: 7/10 at worst Goal status: MET   LONG TERM GOALS: Target date: 03/13/2022   Pt will self report lower back and L LE pain no greater than 2/10 for improved comfort and functional ability Baseline: 7/10 at worst  Goal status:  MET   2.  Pt will improve FOTO function score to no less than 44% as proxy for functional improvement Baseline: 30% function 02/22/2022: 65% function Goal status: MET   3.  Pt will increase 30 Second Sit to Stand rep count to no less than 13 reps for improved balance, strength, and functional mobility Baseline: 11 reps  02/22/2022: 12 reps Goal status: ONGOING   4.  Pt will be able to hold a static plank for no less than 15 seconds for improved core endurance and functional ability Baseline: 3 seconds 02/22/2022: 10 seconds Goal status: ONGOING   PLAN: PT FREQUENCY: 1-2x/week   PT DURATION: 8 weeks   PLANNED INTERVENTIONS: Therapeutic exercises, Therapeutic activity, Neuromuscular re-education, Balance training, Gait training, Patient/Family education, Joint mobilization, Dry Needling, Electrical stimulation, Cryotherapy, Moist heat, Manual therapy, and Re-evaluation.   PLAN FOR NEXT SESSION: assess HEP response, progress core and proximal hip strength as able    Ward Chatters, PT 02/22/2022, 4:21 PM

## 2022-02-27 ENCOUNTER — Other Ambulatory Visit: Payer: Self-pay | Admitting: Orthopedic Surgery

## 2022-02-27 DIAGNOSIS — M25561 Pain in right knee: Secondary | ICD-10-CM | POA: Diagnosis not present

## 2022-02-27 DIAGNOSIS — S8991XA Unspecified injury of right lower leg, initial encounter: Secondary | ICD-10-CM

## 2022-02-27 DIAGNOSIS — S83411A Sprain of medial collateral ligament of right knee, initial encounter: Secondary | ICD-10-CM

## 2022-03-04 ENCOUNTER — Ambulatory Visit
Admission: RE | Admit: 2022-03-04 | Discharge: 2022-03-04 | Disposition: A | Discharge status: Home / Self Care | Payer: Medicare Other | Source: Ambulatory Visit | Attending: Orthopedic Surgery | Admitting: Orthopedic Surgery

## 2022-03-04 DIAGNOSIS — M7989 Other specified soft tissue disorders: Secondary | ICD-10-CM | POA: Diagnosis not present

## 2022-03-04 DIAGNOSIS — S83411A Sprain of medial collateral ligament of right knee, initial encounter: Secondary | ICD-10-CM

## 2022-03-04 DIAGNOSIS — R531 Weakness: Secondary | ICD-10-CM | POA: Diagnosis not present

## 2022-03-04 DIAGNOSIS — S8991XA Unspecified injury of right lower leg, initial encounter: Secondary | ICD-10-CM

## 2022-03-04 DIAGNOSIS — S83511A Sprain of anterior cruciate ligament of right knee, initial encounter: Secondary | ICD-10-CM | POA: Diagnosis not present

## 2022-03-04 DIAGNOSIS — S83281A Other tear of lateral meniscus, current injury, right knee, initial encounter: Secondary | ICD-10-CM | POA: Diagnosis not present

## 2022-03-06 ENCOUNTER — Ambulatory Visit: Payer: Medicare Other | Admitting: *Deleted

## 2022-03-12 DIAGNOSIS — M25561 Pain in right knee: Secondary | ICD-10-CM | POA: Diagnosis not present

## 2022-03-19 ENCOUNTER — Ambulatory Visit: Payer: Medicare Other | Admitting: Specialist

## 2022-03-19 DIAGNOSIS — E1165 Type 2 diabetes mellitus with hyperglycemia: Secondary | ICD-10-CM | POA: Diagnosis not present

## 2022-03-19 DIAGNOSIS — E785 Hyperlipidemia, unspecified: Secondary | ICD-10-CM | POA: Diagnosis not present

## 2022-03-20 DIAGNOSIS — M542 Cervicalgia: Secondary | ICD-10-CM | POA: Diagnosis not present

## 2022-03-20 DIAGNOSIS — G43719 Chronic migraine without aura, intractable, without status migrainosus: Secondary | ICD-10-CM | POA: Diagnosis not present

## 2022-03-20 DIAGNOSIS — M791 Myalgia, unspecified site: Secondary | ICD-10-CM | POA: Diagnosis not present

## 2022-03-20 DIAGNOSIS — G518 Other disorders of facial nerve: Secondary | ICD-10-CM | POA: Diagnosis not present

## 2022-03-25 ENCOUNTER — Other Ambulatory Visit: Payer: Self-pay | Admitting: Specialist

## 2022-03-28 ENCOUNTER — Encounter: Payer: Self-pay | Admitting: Specialist

## 2022-03-28 ENCOUNTER — Ambulatory Visit (INDEPENDENT_AMBULATORY_CARE_PROVIDER_SITE_OTHER): Payer: Medicare Other | Admitting: Specialist

## 2022-03-28 ENCOUNTER — Encounter (INDEPENDENT_AMBULATORY_CARE_PROVIDER_SITE_OTHER): Payer: Self-pay

## 2022-03-28 VITALS — BP 153/103 | HR 87 | Ht 71.0 in | Wt 306.0 lb

## 2022-03-28 DIAGNOSIS — M5137 Other intervertebral disc degeneration, lumbosacral region: Secondary | ICD-10-CM | POA: Diagnosis not present

## 2022-03-28 DIAGNOSIS — M4726 Other spondylosis with radiculopathy, lumbar region: Secondary | ICD-10-CM

## 2022-03-28 DIAGNOSIS — M5116 Intervertebral disc disorders with radiculopathy, lumbar region: Secondary | ICD-10-CM

## 2022-03-28 DIAGNOSIS — M47816 Spondylosis without myelopathy or radiculopathy, lumbar region: Secondary | ICD-10-CM | POA: Diagnosis not present

## 2022-03-28 NOTE — Patient Instructions (Signed)
Plan: Avoid frequent bending and stooping  No lifting greater than 10 lbs. May use ice or moist heat for pain. Weight loss is of benefit.The findings of degenerative disc disease on MRI are extremely subtle and suggest that L5-S1 is likely the source of your pain but the indications that surgery will be able to relieve the pain are not clearly present. I would recommend conservative treatment with a continued exercise program and weight loss. Exercise is important to improve your indurance and does allow people to function better inspite of back pain. In the future the findings may change and it may then become clear that intervention would be of benefit But presently there are too little findings to warranted surgery in the form of a lumbar fusion, I am concerned that the affect of surgery may be more a cause for pain and the resulting change would be no Significant improvement in pain at all.

## 2022-03-28 NOTE — Progress Notes (Signed)
Office Visit Note   Patient: Cindy Phillips           Date of Birth: 1990-11-25           MRN: [id number] Visit Date: 03/28/2022              Requested by: Lucianne Lei, [address] [place],  [place] [address] PCP: Lucianne Lei, MD   Assessment & Plan: Visit Diagnoses:  1. Radiculopathy due to lumbar intervertebral disc disorder   2. Other spondylosis with radiculopathy, lumbar region   3. Spondylosis without myelopathy or radiculopathy, lumbar region   4. Disc disease, degenerative, lumbar or lumbosacral     Plan: Avoid frequent bending and stooping  No lifting greater than 10 lbs. May use ice or moist heat for pain. Weight loss is of benefit.The findings of degenerative disc disease on MRI are extremely subtle and suggest that L5-S1 is likely the source of your pain but the indications that surgery will be able to relieve the pain are not clearly present. I would recommend conservative treatment with a continued exercise program and weight loss. Exercise is important to improve your indurance and does allow people to function better inspite of back pain. In the future the findings may change and it may then become clear that intervention would be of benefit But presently there are too little findings to warranted surgery in the form of a lumbar fusion, I am concerned that the affect of surgery may be more a cause for pain and the resulting change would be no Significant improvement in pain at all.  Follow-Up Instructions: No follow-ups on file.   Orders:  No orders of the defined types were placed in this encounter.  No orders of the defined types were placed in this encounter.     Procedures: No procedures performed   Clinical Data: No additional findings.   Subjective: Chief Complaint  Patient presents with   Lower Back - Follow-up, Pain    HPI  Review of Systems   Objective: Vital Signs: BP (!) 153/103 (BP Location: Left Arm, Patient  Position: Sitting)   Pulse 87   Ht '5\' 11"'$  (1.803 m)   Wt (!) 306 lb (138.8 kg)   BMI 42.68 kg/m   Physical Exam  Ortho Exam  Specialty Comments:  No specialty comments available.  Imaging: No results found.   PMFS History: Patient Active Problem List   Diagnosis Date Noted   Pain in left elbow 07/27/2020   Prediabetes 02/03/2019   Obstructive sleep apnea 04/23/2018   Other fatigue 11/26/2017   Shortness of breath on exertion 11/26/2017   Type 2 diabetes mellitus without complication, without long-term current use of insulin ([place]) 11/26/2017   Other hyperlipidemia 11/26/2017   Vitamin D deficiency 11/26/2017   Schizoaffective disorder, bipolar type ([place]) 09/14/2014   Intellectual disability 09/10/2014   Past Medical History:  Diagnosis Date   Bipolar 1 disorder ([place])    Bowel incontinence    Depression    Diabetes mellitus without complication ([place])    Mental disability     Family History  Problem Relation Age of Onset   High blood pressure Mother    Anxiety disorder Mother    Diabetes Maternal Grandmother     Past Surgical History:  Procedure Laterality Date   FOOT SURGERY     KNEE ARTHROSCOPY W/ ACL RECONSTRUCTION Left 10/12/2020   Social History   Occupational History   Occupation: Clinical research associate  Comment: [employer]   Tobacco Use   Smoking status: Never   Smokeless tobacco: Never  Vaping Use   Vaping Use: Never used  Substance and Sexual Activity   Alcohol use: No   Drug use: No   Sexual activity: Not Currently

## 2022-04-19 DIAGNOSIS — E785 Hyperlipidemia, unspecified: Secondary | ICD-10-CM | POA: Diagnosis not present

## 2022-04-19 DIAGNOSIS — E1165 Type 2 diabetes mellitus with hyperglycemia: Secondary | ICD-10-CM | POA: Diagnosis not present

## 2022-04-25 NOTE — Progress Notes (Signed)
Sent message, via epic in basket, requesting orders in epic from surgeon.  

## 2022-05-01 NOTE — Patient Instructions (Signed)
SURGICAL WAITING ROOM VISITATION Patients having surgery or a procedure may have no more than 2 support people in the waiting area - these visitors may rotate.   Children under the age of 1 must have an adult with them who is not the patient. If the patient needs to stay at the hospital during part of their recovery, the visitor guidelines for inpatient rooms apply. Pre-op nurse will coordinate an appropriate time for 1 support person to accompany patient in pre-op.  This support person may not rotate.    Please refer to the [facility] website for the visitor guidelines for Inpatients (after your surgery is over and you are in a regular room).    Your procedure is scheduled on: 05/11/22   Report to [facility] Main Entrance    Report to admitting at 5:15 AM   Call this number if you have problems the morning of surgery [phone]   Do not eat food :After Midnight.   After Midnight you may have the following liquids until 4:30 AM DAY OF SURGERY  Water Non-Citrus Juices (without pulp, NO RED) Carbonated Beverages Black Coffee (NO MILK/CREAM OR CREAMERS, sugar ok)  Clear Tea (NO MILK/CREAM OR CREAMERS, sugar ok) regular and decaf                             Plain Jell-O (NO RED)                                           Fruit ices (not with fruit pulp, NO RED)                                     Popsicles (NO RED)                                                               Sports drinks like Gatorade (NO RED)  FOLLOW BOWEL PREP AND ANY ADDITIONAL PRE OP INSTRUCTIONS YOU RECEIVED FROM YOUR SURGEON'S OFFICE!!!     Oral Hygiene is also important to reduce your risk of infection.                                    Remember - BRUSH YOUR TEETH THE MORNING OF SURGERY WITH YOUR REGULAR TOOTHPASTE   Take these medicines the morning of surgery with A SIP OF WATER: Tylenol, Lamictal   DO NOT TAKE ANY ORAL DIABETIC MEDICATIONS DAY OF YOUR SURGERY  How to Manage Your  Diabetes Before and After Surgery  Why is it important to control my blood sugar before and after surgery? Improving blood sugar levels before and after surgery helps healing and can limit problems. A way of improving blood sugar control is eating a healthy diet by:  Eating less sugar and carbohydrates  Increasing activity/exercise  Talking with your doctor about reaching your blood sugar goals High blood sugars (greater than 180 mg/dL) can raise your risk of infections and slow your recovery, so you  will need to focus on controlling your diabetes during the weeks before surgery. Make sure that the doctor who takes care of your diabetes knows about your planned surgery including the date and location.  How do I manage my blood sugar before surgery? Check your blood sugar at least 4 times a day, starting 2 days before surgery, to make sure that the level is not too high or low. Check your blood sugar the morning of your surgery when you wake up and every 2 hours until you get to the Short Stay unit. If your blood sugar is less than 70 mg/dL, you will need to treat for low blood sugar: Do not take insulin. Treat a low blood sugar (less than 70 mg/dL) with  cup of clear juice (cranberry or apple), 4 glucose tablets, OR glucose gel. Recheck blood sugar in 15 minutes after treatment (to make sure it is greater than 70 mg/dL). If your blood sugar is not greater than 70 mg/dL on recheck, call [phone] for further instructions. Report your blood sugar to the short stay nurse when you get to Short Stay.  If you are admitted to the hospital after surgery: Your blood sugar will be checked by the staff and you will probably be given insulin after surgery (instead of oral diabetes medicines) to make sure you have good blood sugar levels. The goal for blood sugar control after surgery is 80-180 mg/dL.   WHAT DO I DO ABOUT MY DIABETES MEDICATION?  Do not take oral diabetes medicines (pills) the  morning of surgery.  THE DAY BEFORE SURGERY, take Metformin as prescribed.      THE MORNING OF SURGERY, do not take Metformin.  Reviewed and Endorsed by [facility] Patient Education Committee, August 2015                               You may not have any metal on your body including hair pins, jewelry, and body piercing             Do not wear make-up, lotions, powders, perfumes, or deodorant  Do not wear nail polish including gel and S&S, artificial/acrylic nails, or any other type of covering on natural nails including finger and toenails. If you have artificial nails, gel coating, etc. that needs to be removed by a nail salon please have this removed prior to surgery or surgery may need to be canceled/ delayed if the surgeon/ anesthesia feels like they are unable to be safely monitored.   Do not shave  48 hours prior to surgery.    Do not bring valuables to the hospital. [place].  DO NOT [place]. PHARMACY WILL DISPENSE MEDICATIONS LISTED ON YOUR MEDICATION LIST TO YOU DURING YOUR ADMISSION [place]!    Patients discharged on the day of surgery will not be allowed to drive home.  Someone NEEDS to stay with you for the first 24 hours after anesthesia.              Please read over the following fact sheets you were given: IF YOU HAVE QUESTIONS ABOUT YOUR PRE-OP INSTRUCTIONS PLEASE CALL [facility] - Preparing for Surgery Before surgery, you can play an important role.  Because skin is not sterile, your skin needs  to be as free of germs as possible.  You can reduce the number of germs on your skin by washing with CHG (chlorahexidine gluconate) soap before surgery.  CHG is an antiseptic cleaner which kills germs and bonds with the skin to continue killing germs even after washing. Please DO NOT use if you have an allergy to CHG or antibacterial soaps.  If your skin  becomes reddened/irritated stop using the CHG and inform your nurse when you arrive at Short Stay. Do not shave (including legs and underarms) for at least 48 hours prior to the first CHG shower.  You may shave your face/neck.  Please follow these instructions carefully:  1.  Shower with CHG Soap the night before surgery and the  morning of surgery.  2.  If you choose to wash your hair, wash your hair first as usual with your normal  shampoo.  3.  After you shampoo, rinse your hair and body thoroughly to remove the shampoo.                             4.  Use CHG as you would any other liquid soap.  You can apply chg directly to the skin and wash.  Gently with a scrungie or clean washcloth.  5.  Apply the CHG Soap to your body ONLY FROM THE NECK DOWN.   Do   not use on face/ open                           Wound or open sores. Avoid contact with eyes, ears mouth and   genitals (private parts).                       Wash face,  Genitals (private parts) with your normal soap.             6.  Wash thoroughly, paying special attention to the area where your    surgery  will be performed.  7.  Thoroughly rinse your body with warm water from the neck down.  8.  DO NOT shower/wash with your normal soap after using and rinsing off the CHG Soap.                9.  Pat yourself dry with a clean towel.            10.  Wear clean pajamas.            11.  Place clean sheets on your bed the night of your first shower and do not  sleep with pets. Day of Surgery : Do not apply any lotions/deodorants the morning of surgery.  Please wear clean clothes to the hospital/surgery center.  FAILURE TO FOLLOW THESE INSTRUCTIONS MAY RESULT IN THE CANCELLATION OF YOUR SURGERY  PATIENT SIGNATURE_________________________________  NURSE SIGNATURE__________________________________  ________________________________________________________________________

## 2022-05-01 NOTE — Progress Notes (Signed)
COVID Vaccine Completed: yes x2  Date of COVID positive in last 90 days:  PCP - Lu Duffel, MD Cardiologist -   Chest x-ray -  EKG -  Stress Test -  ECHO -  Cardiac Cath -  Pacemaker/ICD device last checked: Spinal Cord Stimulator:  Bowel Prep -   Sleep Study -  CPAP -   Fasting Blood Sugar -  Checks Blood Sugar _____ times a day  Blood Thinner Instructions: Aspirin Instructions: Last Dose:  Activity level:  Can go up a flight of stairs and perform activities of daily living without stopping and without symptoms of chest pain or shortness of breath.  Able to exercise without symptoms  Unable to go up a flight of stairs without symptoms of     Anesthesia review:   Patient denies shortness of breath, fever, cough and chest pain at PAT appointment  Patient verbalized understanding of instructions that were given to them at the PAT appointment. Patient was also instructed that they will need to review over the PAT instructions again at home before surgery.

## 2022-05-01 NOTE — Progress Notes (Signed)
Please place orders for PAT appointment scheduled 05/02/22.

## 2022-05-02 ENCOUNTER — Encounter (HOSPITAL_COMMUNITY): Payer: Self-pay

## 2022-05-02 ENCOUNTER — Encounter (HOSPITAL_COMMUNITY)
Admission: RE | Admit: 2022-05-02 | Discharge: 2022-05-02 | Disposition: A | Discharge status: Home / Self Care | Payer: Medicare Other | Source: Ambulatory Visit | Attending: Orthopedic Surgery | Admitting: Orthopedic Surgery

## 2022-05-02 VITALS — BP 164/105 | HR 83 | Temp 98.1°F | Resp 14 | Ht 72.0 in | Wt 350.0 lb

## 2022-05-02 DIAGNOSIS — Z01818 Encounter for other preprocedural examination: Secondary | ICD-10-CM | POA: Diagnosis not present

## 2022-05-02 DIAGNOSIS — E119 Type 2 diabetes mellitus without complications: Secondary | ICD-10-CM | POA: Diagnosis not present

## 2022-05-02 HISTORY — DX: Headache, unspecified: R51.9

## 2022-05-02 HISTORY — DX: Sleep apnea, unspecified: G47.30

## 2022-05-02 HISTORY — DX: Unspecified osteoarthritis, unspecified site: M19.90

## 2022-05-02 LAB — GLUCOSE, CAPILLARY: Glucose-Capillary: 98 mg/dL (ref 70–99)

## 2022-05-02 LAB — HEMOGLOBIN A1C
Hgb A1c MFr Bld: 5.7 % — ABNORMAL HIGH (ref 4.8–5.6)
Mean Plasma Glucose: 116.89 mg/dL

## 2022-05-02 LAB — CBC
HCT: 33.5 % — ABNORMAL LOW (ref 36.0–46.0)
Hemoglobin: 11 g/dL — ABNORMAL LOW (ref 12.0–15.0)
MCH: 29.1 pg (ref 26.0–34.0)
MCHC: 32.8 g/dL (ref 30.0–36.0)
MCV: 88.6 fL (ref 80.0–100.0)
Platelets: 216 10*3/uL (ref 150–400)
RBC: 3.78 MIL/uL — ABNORMAL LOW (ref 3.87–5.11)
RDW: 13.4 % (ref 11.5–15.5)
WBC: 7.1 10*3/uL (ref 4.0–10.5)
nRBC: 0 % (ref 0.0–0.2)

## 2022-05-02 LAB — BASIC METABOLIC PANEL
Anion gap: 6 (ref 5–15)
BUN: 10 mg/dL (ref 6–20)
CO2: 25 mmol/L (ref 22–32)
Calcium: 9.9 mg/dL (ref 8.9–10.3)
Chloride: 110 mmol/L (ref 98–111)
Creatinine, Ser: 0.84 mg/dL (ref 0.44–1.00)
GFR, Estimated: >60 mL/min (ref >60)
Glucose, Bld: 88 mg/dL (ref 70–99)
Potassium: 3.9 mmol/L (ref 3.5–5.1)
Sodium: 141 mmol/L (ref 135–145)

## 2022-05-03 ENCOUNTER — Ambulatory Visit: Payer: Self-pay | Admitting: Physician Assistant

## 2022-05-03 NOTE — H&P (Signed)
Cindy Phillips is an 31 y.o. female.   Chief Complaint: right knee pain HPI: She had a complex situation with a reconstruction of the left knee. She was playing basketball and injured her right knee and felt a pop. She  is antalgic. She cannot  bear full weight. Severely painful. She has had a tear on the opposite knee that we fixed and now she has an acute tear of her right knee, small radial tear lateral meniscus.  She actually did a little bit better than expected.  She had a pretty arthritic knee on the opposite side.  She is still physically active and wanting to participate in sports.  She does need to have her meniscus addressed but at this point is wanting to consider having surgery and reconstruction.    Past Medical History:  Diagnosis Date   Arthritis    Bipolar 1 disorder ([place])    Bowel incontinence    Depression    Diabetes mellitus without complication ([place])    Headache    migraines   Mental disability    Sleep apnea     Past Surgical History:  Procedure Laterality Date   FOOT SURGERY     KNEE ARTHROSCOPY W/ ACL RECONSTRUCTION Left 10/12/2020   TOOTH EXTRACTION      Family History  Problem Relation Age of Onset   High blood pressure Mother    Anxiety disorder Mother    Diabetes Maternal Grandmother    Social History:  reports that she has never smoked. She has never used smokeless tobacco. She reports that she does not currently use alcohol. She reports that she does not use drugs.  Allergies: No Known Allergies  (Not in a hospital admission)   Results for orders placed or performed during the hospital encounter of 05/02/22 (from the past 48 hour(s))  Glucose, capillary     Status: None   Collection Time: 05/02/22 12:59 PM  Result Value Ref Range   Glucose-Capillary 98 70 - 99 mg/dL    Comment: Glucose reference range applies only to samples taken after fasting for at least 8 hours.  Hemoglobin A1c per protocol     Status: Abnormal   Collection Time:  05/02/22  1:24 PM  Result Value Ref Range   Hgb A1c MFr Bld 5.7 (H) 4.8 - 5.6 %    Comment: (NOTE) Pre diabetes:          5.7%-6.4%  Diabetes:              >6.4%  Glycemic control for   <7.0% adults with diabetes    Mean Plasma Glucose 116.89 mg/dL    Comment: Performed at [facility] [address], [place],  [address]  Basic metabolic panel per protocol     Status: None   Collection Time: 05/02/22  1:24 PM  Result Value Ref Range   Sodium 141 135 - 145 mmol/L   Potassium 3.9 3.5 - 5.1 mmol/L   Chloride 110 98 - 111 mmol/L   CO2 25 22 - 32 mmol/L   Glucose, Bld 88 70 - 99 mg/dL    Comment: Glucose reference range applies only to samples taken after fasting for at least 8 hours.   BUN 10 6 - 20 mg/dL   Creatinine, Ser 0.84 0.44 - 1.00 mg/dL   Calcium 9.9 8.9 - 10.3 mg/dL   GFR, Estimated >60 >60 mL/min    Comment: (NOTE) Calculated using the CKD-EPI Creatinine Equation (2021)    Anion  gap 6 5 - 15    Comment: Performed at [facility], [place] [address] [place], [place] [address]  CBC per protocol     Status: Abnormal   Collection Time: 05/02/22  1:24 PM  Result Value Ref Range   WBC 7.1 4.0 - 10.5 K/uL   RBC 3.78 (L) 3.87 - 5.11 MIL/uL   Hemoglobin 11.0 (L) 12.0 - 15.0 g/dL   HCT 33.5 (L) 36.0 - 46.0 %   MCV 88.6 80.0 - 100.0 fL   MCH 29.1 26.0 - 34.0 pg   MCHC 32.8 30.0 - 36.0 g/dL   RDW 13.4 11.5 - 15.5 %   Platelets 216 150 - 400 K/uL   nRBC 0.0 0.0 - 0.2 %    Comment: Performed at [facility], [place] [address], [place], [place] [address]   No results found.  Review of Systems  Musculoskeletal:  Positive for arthralgias.  Neurological:  Positive for headaches.  All other systems reviewed and are negative.   There were no vitals taken for this visit. Physical Exam Constitutional:      Appearance: Normal appearance.  HENT:     Head: Normocephalic and atraumatic.  Eyes:     Extraocular Movements:  Extraocular movements intact.     Pupils: Pupils are equal, round, and reactive to light.  Cardiovascular:     Rate and Rhythm: Normal rate and regular rhythm.  Pulmonary:     Effort: Pulmonary effort is normal.     Breath sounds: Normal breath sounds.  Abdominal:     General: Abdomen is flat. There is no distension.  Musculoskeletal:     Cervical back: Normal range of motion and neck supple.     Comments: The medial compartment and joint line is not severely painful. Positive Lachman's. Severe lateral joint line tenderness and pain.   Skin:    General: Skin is warm and dry.     Findings: No erythema or rash.  Neurological:     General: No focal deficit present.     Mental Status: She is alert and oriented to person, place, and time.  Psychiatric:        Mood and Affect: Mood normal.        Behavior: Behavior normal.      Assessment/Plan Right knee ACL and Lat meniscus tears  We did an allograft on her other knee and I would consider that to be the best option with the mini C-arm.  Risks and benefits were discussed in detail.  She would need postop PT and postop aspirin.    Chriss Czar, PA-C 05/03/2022, 12:53 PM

## 2022-05-03 NOTE — H&P (View-Only) (Signed)
Cindy Phillips is an 31 y.o. female.   Chief Complaint: right knee pain HPI: She had a complex situation with a reconstruction of the left knee. She was playing basketball and injured her right knee and felt a pop. She  is antalgic. She cannot  bear full weight. Severely painful. She has had a tear on the opposite knee that we fixed and now she has an acute tear of her right knee, small radial tear lateral meniscus.  She actually did a little bit better than expected.  She had a pretty arthritic knee on the opposite side.  She is still physically active and wanting to participate in sports.  She does need to have her meniscus addressed but at this point is wanting to consider having surgery and reconstruction.    Past Medical History:  Diagnosis Date   Arthritis    Bipolar 1 disorder ([place])    Bowel incontinence    Depression    Diabetes mellitus without complication ([place])    Headache    migraines   Mental disability    Sleep apnea     Past Surgical History:  Procedure Laterality Date   FOOT SURGERY     KNEE ARTHROSCOPY W/ ACL RECONSTRUCTION Left 10/12/2020   TOOTH EXTRACTION      Family History  Problem Relation Age of Onset   High blood pressure Mother    Anxiety disorder Mother    Diabetes Maternal Grandmother    Social History:  reports that she has never smoked. She has never used smokeless tobacco. She reports that she does not currently use alcohol. She reports that she does not use drugs.  Allergies: No Known Allergies  (Not in a hospital admission)   Results for orders placed or performed during the hospital encounter of 05/02/22 (from the past 48 hour(s))  Glucose, capillary     Status: None   Collection Time: 05/02/22 12:59 PM  Result Value Ref Range   Glucose-Capillary 98 70 - 99 mg/dL    Comment: Glucose reference range applies only to samples taken after fasting for at least 8 hours.  Hemoglobin A1c per protocol     Status: Abnormal   Collection Time:  05/02/22  1:24 PM  Result Value Ref Range   Hgb A1c MFr Bld 5.7 (H) 4.8 - 5.6 %    Comment: (NOTE) Pre diabetes:          5.7%-6.4%  Diabetes:              >6.4%  Glycemic control for   <7.0% adults with diabetes    Mean Plasma Glucose 116.89 mg/dL    Comment: Performed at [facility] [address] [place],  [address]  Basic metabolic panel per protocol     Status: None   Collection Time: 05/02/22  1:24 PM  Result Value Ref Range   Sodium 141 135 - 145 mmol/L   Potassium 3.9 3.5 - 5.1 mmol/L   Chloride 110 98 - 111 mmol/L   CO2 25 22 - 32 mmol/L   Glucose, Bld 88 70 - 99 mg/dL    Comment: Glucose reference range applies only to samples taken after fasting for at least 8 hours.   BUN 10 6 - 20 mg/dL   Creatinine, Ser 0.84 0.44 - 1.00 mg/dL   Calcium 9.9 8.9 - 10.3 mg/dL   GFR, Estimated >60 >60 mL/min    Comment: (NOTE) Calculated using the CKD-EPI Creatinine Equation (2021)    Anion  gap 6 5 - 15    Comment: Performed at [facility], [place] [address] [place], [place] [address]  CBC per protocol     Status: Abnormal   Collection Time: 05/02/22  1:24 PM  Result Value Ref Range   WBC 7.1 4.0 - 10.5 K/uL   RBC 3.78 (L) 3.87 - 5.11 MIL/uL   Hemoglobin 11.0 (L) 12.0 - 15.0 g/dL   HCT 33.5 (L) 36.0 - 46.0 %   MCV 88.6 80.0 - 100.0 fL   MCH 29.1 26.0 - 34.0 pg   MCHC 32.8 30.0 - 36.0 g/dL   RDW 13.4 11.5 - 15.5 %   Platelets 216 150 - 400 K/uL   nRBC 0.0 0.0 - 0.2 %    Comment: Performed at [facility], [place] [address], [place], [place] [address]   No results found.  Review of Systems  Musculoskeletal:  Positive for arthralgias.  Neurological:  Positive for headaches.  All other systems reviewed and are negative.   There were no vitals taken for this visit. Physical Exam Constitutional:      Appearance: Normal appearance.  HENT:     Head: Normocephalic and atraumatic.  Eyes:     Extraocular Movements:  Extraocular movements intact.     Pupils: Pupils are equal, round, and reactive to light.  Cardiovascular:     Rate and Rhythm: Normal rate and regular rhythm.  Pulmonary:     Effort: Pulmonary effort is normal.     Breath sounds: Normal breath sounds.  Abdominal:     General: Abdomen is flat. There is no distension.  Musculoskeletal:     Cervical back: Normal range of motion and neck supple.     Comments: The medial compartment and joint line is not severely painful. Positive Lachman's. Severe lateral joint line tenderness and pain.   Skin:    General: Skin is warm and dry.     Findings: No erythema or rash.  Neurological:     General: No focal deficit present.     Mental Status: She is alert and oriented to person, place, and time.  Psychiatric:        Mood and Affect: Mood normal.        Behavior: Behavior normal.      Assessment/Plan Right knee ACL and Lat meniscus tears  We did an allograft on her other knee and I would consider that to be the best option with the mini C-arm.  Risks and benefits were discussed in detail.  She would need postop PT and postop aspirin.    Chriss Czar, PA-C 05/03/2022, 12:53 PM

## 2022-05-08 DIAGNOSIS — R030 Elevated blood-pressure reading, without diagnosis of hypertension: Secondary | ICD-10-CM | POA: Diagnosis not present

## 2022-05-08 DIAGNOSIS — E1169 Type 2 diabetes mellitus with other specified complication: Secondary | ICD-10-CM | POA: Diagnosis not present

## 2022-05-09 DIAGNOSIS — R030 Elevated blood-pressure reading, without diagnosis of hypertension: Secondary | ICD-10-CM | POA: Diagnosis not present

## 2022-05-10 NOTE — Anesthesia Preprocedure Evaluation (Addendum)
Anesthesia Evaluation  Patient identified by MRN, date of birth, ID band Patient awake    Reviewed: Allergy & Precautions, NPO status , Patient's Chart, lab work & pertinent test results  History of Anesthesia Complications Negative for: history of anesthetic complications  Airway Mallampati: III  TM Distance: >3 FB Neck ROM: Full    Dental  (+) Dental Advisory Given   Pulmonary sleep apnea ,    Pulmonary exam normal        Cardiovascular negative cardio ROS Normal cardiovascular exam     Neuro/Psych PSYCHIATRIC DISORDERS Depression Bipolar Disorder Schizophrenia Mental disability negative neurological ROS     GI/Hepatic negative GI ROS, Neg liver ROS,   Endo/Other  diabetesMorbid obesity  Renal/GU negative Renal ROS     Musculoskeletal   Abdominal   Peds  Hematology  (+) Blood dyscrasia, anemia ,   Anesthesia Other Findings   Reproductive/Obstetrics                            Anesthesia Physical Anesthesia Plan  ASA: 3  Anesthesia Plan: General   Post-op Pain Management: Regional block*, Celebrex PO (pre-op)* and Tylenol PO (pre-op)*   Induction: Intravenous  PONV Risk Score and Plan: 4 or greater and Ondansetron, Dexamethasone and Midazolam  Airway Management Planned: LMA and Oral ETT  Additional Equipment:   Intra-op Plan:   Post-operative Plan: Extubation in OR  Informed Consent: I have reviewed the patients History and Physical, chart, labs and discussed the procedure including the risks, benefits and alternatives for the proposed anesthesia with the patient or authorized representative who has indicated his/her understanding and acceptance.     Dental advisory given  Plan Discussed with: Anesthesiologist and CRNA  Anesthesia Plan Comments:        Anesthesia Quick Evaluation

## 2022-05-11 ENCOUNTER — Ambulatory Visit (HOSPITAL_COMMUNITY)
Admission: RE | Admit: 2022-05-11 | Discharge: 2022-05-11 | Disposition: A | Discharge status: Home / Self Care | Payer: Medicare Other | Attending: Orthopedic Surgery | Admitting: Orthopedic Surgery

## 2022-05-11 ENCOUNTER — Encounter (HOSPITAL_COMMUNITY)
Admission: RE | Disposition: A | Discharge status: Home / Self Care | Payer: Self-pay | Source: Home / Self Care | Attending: Orthopedic Surgery

## 2022-05-11 ENCOUNTER — Other Ambulatory Visit: Payer: Self-pay

## 2022-05-11 ENCOUNTER — Ambulatory Visit (HOSPITAL_COMMUNITY): Payer: Medicare Other | Admitting: Physician Assistant

## 2022-05-11 ENCOUNTER — Ambulatory Visit (HOSPITAL_BASED_OUTPATIENT_CLINIC_OR_DEPARTMENT_OTHER): Payer: Medicare Other | Admitting: Certified Registered"

## 2022-05-11 ENCOUNTER — Encounter (HOSPITAL_COMMUNITY): Payer: Self-pay | Admitting: Orthopedic Surgery

## 2022-05-11 DIAGNOSIS — S83281A Other tear of lateral meniscus, current injury, right knee, initial encounter: Secondary | ICD-10-CM | POA: Diagnosis not present

## 2022-05-11 DIAGNOSIS — Y9367 Activity, basketball: Secondary | ICD-10-CM | POA: Diagnosis not present

## 2022-05-11 DIAGNOSIS — M1711 Unilateral primary osteoarthritis, right knee: Secondary | ICD-10-CM | POA: Diagnosis not present

## 2022-05-11 DIAGNOSIS — G4730 Sleep apnea, unspecified: Secondary | ICD-10-CM | POA: Insufficient documentation

## 2022-05-11 DIAGNOSIS — Z01818 Encounter for other preprocedural examination: Secondary | ICD-10-CM

## 2022-05-11 DIAGNOSIS — S83511A Sprain of anterior cruciate ligament of right knee, initial encounter: Secondary | ICD-10-CM | POA: Insufficient documentation

## 2022-05-11 DIAGNOSIS — X58XXXA Exposure to other specified factors, initial encounter: Secondary | ICD-10-CM | POA: Insufficient documentation

## 2022-05-11 DIAGNOSIS — S83241A Other tear of medial meniscus, current injury, right knee, initial encounter: Secondary | ICD-10-CM | POA: Diagnosis not present

## 2022-05-11 DIAGNOSIS — E119 Type 2 diabetes mellitus without complications: Secondary | ICD-10-CM | POA: Insufficient documentation

## 2022-05-11 DIAGNOSIS — Z6841 Body Mass Index (BMI) 40.0 and over, adult: Secondary | ICD-10-CM | POA: Insufficient documentation

## 2022-05-11 DIAGNOSIS — C9100 Acute lymphoblastic leukemia not having achieved remission: Secondary | ICD-10-CM

## 2022-05-11 HISTORY — PX: ANTERIOR CRUCIATE LIGAMENT REPAIR: SHX115

## 2022-05-11 LAB — GLUCOSE, CAPILLARY
Glucose-Capillary: 132 mg/dL — ABNORMAL HIGH (ref 70–99)
Glucose-Capillary: 139 mg/dL — ABNORMAL HIGH (ref 70–99)

## 2022-05-11 LAB — POCT PREGNANCY, URINE: Preg Test, Ur: NEGATIVE

## 2022-05-11 SURGERY — REPAIR, KNEE, ACL
Anesthesia: General | Site: Knee | Laterality: Right

## 2022-05-11 MED ORDER — BUPIVACAINE-EPINEPHRINE (PF) 0.5% -1:200000 IJ SOLN
INTRAMUSCULAR | Status: AC
  Filled 2022-05-11: qty 30

## 2022-05-11 MED ORDER — EPINEPHRINE PF 1 MG/ML IJ SOLN
INTRAMUSCULAR | Status: AC
  Filled 2022-05-11: qty 2

## 2022-05-11 MED ORDER — ACETAMINOPHEN 500 MG PO TABS
1000.0000 mg | ORAL_TABLET | Freq: Once | ORAL | Status: AC
  Administered 2022-05-11: 1000 mg via ORAL
  Filled 2022-05-11: qty 2

## 2022-05-11 MED ORDER — ONDANSETRON HCL 4 MG/2ML IJ SOLN
INTRAMUSCULAR | Status: AC
  Filled 2022-05-11: qty 2

## 2022-05-11 MED ORDER — FENTANYL CITRATE (PF) 250 MCG/5ML IJ SOLN
INTRAMUSCULAR | Status: DC | PRN
  Administered 2022-05-11 (×2): 50 ug via INTRAVENOUS
  Administered 2022-05-11: 100 ug via INTRAVENOUS

## 2022-05-11 MED ORDER — CELECOXIB 200 MG PO CAPS: 200.0000 mg | ORAL_CAPSULE | Freq: Two times a day (BID) | ORAL | 0 refills | Status: DC

## 2022-05-11 MED ORDER — DEXMEDETOMIDINE HCL IN NACL 80 MCG/20ML IV SOLN
INTRAVENOUS | Status: AC
  Filled 2022-05-11: qty 20

## 2022-05-11 MED ORDER — DEXAMETHASONE SODIUM PHOSPHATE 10 MG/ML IJ SOLN
INTRAMUSCULAR | Status: DC | PRN
  Administered 2022-05-11: 8 mg via INTRAVENOUS

## 2022-05-11 MED ORDER — FENTANYL CITRATE PF 50 MCG/ML IJ SOSY
25.0000 ug | PREFILLED_SYRINGE | INTRAMUSCULAR | Status: DC | PRN
  Administered 2022-05-11: 50 ug via INTRAVENOUS
  Administered 2022-05-11 (×2): 25 ug via INTRAVENOUS
  Administered 2022-05-11: 50 ug via INTRAVENOUS

## 2022-05-11 MED ORDER — OXYCODONE HCL 5 MG PO TABS: ORAL_TABLET | ORAL | 0 refills | Status: DC

## 2022-05-11 MED ORDER — ROCURONIUM BROMIDE 10 MG/ML (PF) SYRINGE
PREFILLED_SYRINGE | INTRAVENOUS | Status: AC
  Filled 2022-05-11: qty 10

## 2022-05-11 MED ORDER — METHYLPREDNISOLONE ACETATE 40 MG/ML IJ SUSP
INTRAMUSCULAR | Status: AC
  Filled 2022-05-11: qty 1

## 2022-05-11 MED ORDER — MORPHINE SULFATE (PF) 4 MG/ML IV SOLN
INTRAVENOUS | Status: AC
  Filled 2022-05-11: qty 1

## 2022-05-11 MED ORDER — DEXMEDETOMIDINE HCL IN NACL 80 MCG/20ML IV SOLN
INTRAVENOUS | Status: DC | PRN
  Administered 2022-05-11: 8 ug via BUCCAL
  Administered 2022-05-11 (×3): 4 ug via BUCCAL

## 2022-05-11 MED ORDER — ONDANSETRON HCL 4 MG/2ML IJ SOLN
INTRAMUSCULAR | Status: DC | PRN
  Administered 2022-05-11: 4 mg via INTRAVENOUS

## 2022-05-11 MED ORDER — ACETAMINOPHEN 10 MG/ML IV SOLN
INTRAVENOUS | Status: AC
  Filled 2022-05-11: qty 100

## 2022-05-11 MED ORDER — OXYCODONE HCL 5 MG PO TABS
ORAL_TABLET | ORAL | Status: AC
  Filled 2022-05-11: qty 1

## 2022-05-11 MED ORDER — PROMETHAZINE HCL 25 MG/ML IJ SOLN: 6.2500 mg | INTRAMUSCULAR | Status: DC | PRN

## 2022-05-11 MED ORDER — MIDAZOLAM HCL 2 MG/2ML IJ SOLN
INTRAMUSCULAR | Status: DC | PRN
  Administered 2022-05-11: 2 mg via INTRAVENOUS

## 2022-05-11 MED ORDER — ACETAMINOPHEN 500 MG PO TABS
1000.0000 mg | ORAL_TABLET | Freq: Four times a day (QID) | ORAL | Status: DC
  Administered 2022-05-11: 1000 mg via ORAL

## 2022-05-11 MED ORDER — ACETAMINOPHEN 500 MG PO TABS: 1000.0000 mg | ORAL_TABLET | Freq: Once | ORAL | Status: DC

## 2022-05-11 MED ORDER — DEXAMETHASONE SODIUM PHOSPHATE 10 MG/ML IJ SOLN
INTRAMUSCULAR | Status: AC
  Filled 2022-05-11: qty 1

## 2022-05-11 MED ORDER — CELECOXIB 200 MG PO CAPS
200.0000 mg | ORAL_CAPSULE | Freq: Once | ORAL | Status: AC
  Administered 2022-05-11: 200 mg via ORAL
  Filled 2022-05-11: qty 1

## 2022-05-11 MED ORDER — SUGAMMADEX SODIUM 200 MG/2ML IV SOLN
INTRAVENOUS | Status: DC | PRN
  Administered 2022-05-11: 320 mg via INTRAVENOUS

## 2022-05-11 MED ORDER — ORAL CARE MOUTH RINSE: 15.0000 mL | Freq: Once | OROMUCOSAL | Status: AC

## 2022-05-11 MED ORDER — FENTANYL CITRATE PF 50 MCG/ML IJ SOSY
PREFILLED_SYRINGE | INTRAMUSCULAR | Status: AC
  Filled 2022-05-11: qty 2

## 2022-05-11 MED ORDER — PHENYLEPHRINE 80 MCG/ML (10ML) SYRINGE FOR IV PUSH (FOR BLOOD PRESSURE SUPPORT)
PREFILLED_SYRINGE | INTRAVENOUS | Status: DC | PRN
  Administered 2022-05-11: 80 ug via INTRAVENOUS
  Administered 2022-05-11: 120 ug via INTRAVENOUS
  Administered 2022-05-11 (×4): 80 ug via INTRAVENOUS
  Administered 2022-05-11: 120 ug via INTRAVENOUS
  Administered 2022-05-11: 80 ug via INTRAVENOUS

## 2022-05-11 MED ORDER — SUGAMMADEX SODIUM 500 MG/5ML IV SOLN
INTRAVENOUS | Status: AC
  Filled 2022-05-11: qty 5

## 2022-05-11 MED ORDER — CYCLOBENZAPRINE HCL 10 MG PO TABS: 10.0000 mg | ORAL_TABLET | Freq: Three times a day (TID) | ORAL | Status: DC | PRN

## 2022-05-11 MED ORDER — SODIUM CHLORIDE 0.9 % IR SOLN
Status: DC | PRN
  Administered 2022-05-11 (×2): 3000 mL
  Administered 2022-05-11: 6000 mL

## 2022-05-11 MED ORDER — ASPIRIN 81 MG PO TBEC: 81.0000 mg | DELAYED_RELEASE_TABLET | Freq: Two times a day (BID) | ORAL | 0 refills | Status: AC

## 2022-05-11 MED ORDER — PROPOFOL 10 MG/ML IV BOLUS
INTRAVENOUS | Status: AC
  Filled 2022-05-11: qty 20

## 2022-05-11 MED ORDER — FENTANYL CITRATE PF 50 MCG/ML IJ SOSY
PREFILLED_SYRINGE | INTRAMUSCULAR | Status: AC
  Filled 2022-05-11: qty 1

## 2022-05-11 MED ORDER — ESMOLOL HCL 100 MG/10ML IV SOLN
INTRAVENOUS | Status: DC | PRN
  Administered 2022-05-11: 30 mg via INTRAVENOUS

## 2022-05-11 MED ORDER — FENTANYL CITRATE (PF) 100 MCG/2ML IJ SOLN
INTRAMUSCULAR | Status: AC
  Filled 2022-05-11: qty 2

## 2022-05-11 MED ORDER — AMISULPRIDE (ANTIEMETIC) 5 MG/2ML IV SOLN: 10.0000 mg | Freq: Once | INTRAVENOUS | Status: DC | PRN

## 2022-05-11 MED ORDER — OXYCODONE HCL 5 MG PO TABS
5.0000 mg | ORAL_TABLET | Freq: Once | ORAL | Status: AC
  Administered 2022-05-11: 5 mg via ORAL

## 2022-05-11 MED ORDER — ACETAMINOPHEN 500 MG PO TABS
ORAL_TABLET | ORAL | Status: AC
  Filled 2022-05-11: qty 2

## 2022-05-11 MED ORDER — CHLORHEXIDINE GLUCONATE 0.12 % MT SOLN
15.0000 mL | Freq: Once | OROMUCOSAL | Status: AC
  Administered 2022-05-11: 15 mL via OROMUCOSAL

## 2022-05-11 MED ORDER — CEFAZOLIN IN SODIUM CHLORIDE 3-0.9 GM/100ML-% IV SOLN
3.0000 g | INTRAVENOUS | Status: AC
  Administered 2022-05-11: 3 g via INTRAVENOUS
  Filled 2022-05-11: qty 100

## 2022-05-11 MED ORDER — LIDOCAINE HCL (PF) 2 % IJ SOLN
INTRAMUSCULAR | Status: AC
  Filled 2022-05-11: qty 5

## 2022-05-11 MED ORDER — DOCUSATE SODIUM 100 MG PO CAPS: 100.0000 mg | ORAL_CAPSULE | Freq: Every day | ORAL | 2 refills | Status: DC | PRN

## 2022-05-11 MED ORDER — ROCURONIUM BROMIDE 10 MG/ML (PF) SYRINGE
PREFILLED_SYRINGE | INTRAVENOUS | Status: DC | PRN
  Administered 2022-05-11: 100 mg via INTRAVENOUS

## 2022-05-11 MED ORDER — LACTATED RINGERS IV SOLN: INTRAVENOUS | Status: DC

## 2022-05-11 MED ORDER — MIDAZOLAM HCL 2 MG/2ML IJ SOLN
INTRAMUSCULAR | Status: AC
  Filled 2022-05-11: qty 2

## 2022-05-11 MED ORDER — PHENYLEPHRINE 80 MCG/ML (10ML) SYRINGE FOR IV PUSH (FOR BLOOD PRESSURE SUPPORT)
PREFILLED_SYRINGE | INTRAVENOUS | Status: AC
  Filled 2022-05-11: qty 10

## 2022-05-11 MED ORDER — PROPOFOL 10 MG/ML IV BOLUS
INTRAVENOUS | Status: DC | PRN
  Administered 2022-05-11: 250 mg via INTRAVENOUS

## 2022-05-11 MED ORDER — SODIUM CHLORIDE 0.9 % IV SOLN: INTRAVENOUS | Status: DC

## 2022-05-11 SURGICAL SUPPLY — 81 items
ANCHOR BUTTON TIGHTROPE II FT (Anchor) IMPLANT
ANCHOR BUTTON TIGHTROPE II OP (Anchor) IMPLANT
ANCHOR BUTTON TIGHTROPE RC 20 (Anchor) IMPLANT
BAG COUNTER SPONGE SURGICOUNT (BAG) IMPLANT
BANDAGE ESMARK 6X9 LF (GAUZE/BANDAGES/DRESSINGS) IMPLANT
BENZOIN TINCTURE PRP APPL 2/3 (GAUZE/BANDAGES/DRESSINGS) IMPLANT
BLADE EXCALIBUR 4.0X13 (MISCELLANEOUS) ×1 IMPLANT
BLADE SURG 15 STRL LF DISP TIS (BLADE) ×2 IMPLANT
BLADE SURG 15 STRL SS (BLADE) ×2
BNDG ELASTIC 6X15 VLCR STRL LF (GAUZE/BANDAGES/DRESSINGS) IMPLANT
BNDG ELASTIC 6X5.8 VLCR STR LF (GAUZE/BANDAGES/DRESSINGS) ×1 IMPLANT
BNDG ESMARK 6X9 LF (GAUZE/BANDAGES/DRESSINGS)
BNDG GAUZE DERMACEA FLUFF 4 (GAUZE/BANDAGES/DRESSINGS) IMPLANT
BURR OVAL 8 FLU 4.0X13 (MISCELLANEOUS) ×1 IMPLANT
COVER BACK TABLE 60X90IN (DRAPES) ×1 IMPLANT
DISSECTOR  3.8MM X 13CM (MISCELLANEOUS)
DISSECTOR 3.8MM X 13CM (MISCELLANEOUS) IMPLANT
DISSECTOR 4.0MM X 13CM (MISCELLANEOUS) IMPLANT
DRAPE ARTHROSCOPY W/POUCH 114 (DRAPES) ×1 IMPLANT
DRAPE IMP U-DRAPE 54X76 (DRAPES) ×1 IMPLANT
DRAPE OEC MINIVIEW 54X84 (DRAPES) ×1 IMPLANT
DRILL FLIPCUTTER III 6-12 (ORTHOPEDIC DISPOSABLE SUPPLIES) ×1 IMPLANT
DRSG EMULSION OIL 3X3 NADH (GAUZE/BANDAGES/DRESSINGS) IMPLANT
DURAPREP 26ML APPLICATOR (WOUND CARE) ×1 IMPLANT
ELECT REM PT RETURN 15FT ADLT (MISCELLANEOUS) IMPLANT
EXCALIBUR 3.8MM X 13CM (MISCELLANEOUS) IMPLANT
FIBERSTICK 2 (SUTURE) IMPLANT
FLIPCUTTER III 6-12 AR-1204FF (ORTHOPEDIC DISPOSABLE SUPPLIES) ×1
GAUZE PAD ABD 8X10 STRL (GAUZE/BANDAGES/DRESSINGS) IMPLANT
GAUZE SPONGE 4X4 12PLY STRL (GAUZE/BANDAGES/DRESSINGS) ×1 IMPLANT
GLOVE BIO SURGEON STRL SZ7.5 (GLOVE) IMPLANT
GLOVE BIOGEL PI IND STRL 8 (GLOVE) ×2 IMPLANT
GLOVE SURG ORTHO 8.0 STRL STRW (GLOVE) ×1 IMPLANT
GOWN STRL REUS W/ TWL LRG LVL3 (GOWN DISPOSABLE) ×1 IMPLANT
GOWN STRL REUS W/ TWL XL LVL3 (GOWN DISPOSABLE) ×1 IMPLANT
GOWN STRL REUS W/TWL LRG LVL3 (GOWN DISPOSABLE) ×1
GOWN STRL REUS W/TWL XL LVL3 (GOWN DISPOSABLE) ×1
GRAFT TISS 60-80 FRZN TENDON (Tissue) IMPLANT
IMMOBILIZER KNEE 20 (SOFTGOODS)
IMMOBILIZER KNEE 20 THIGH 36 (SOFTGOODS) IMPLANT
IMMOBILIZER KNEE 22 UNIV (SOFTGOODS) IMPLANT
INSERT FOAM CUSHION (MISCELLANEOUS) IMPLANT
KIT BASIN OR (CUSTOM PROCEDURE TRAY) ×1 IMPLANT
KIT BIO-TENODESIS 3X8 DISP (MISCELLANEOUS) ×1
KIT INSRT BABSR STRL DISP BTN (MISCELLANEOUS) IMPLANT
KIT TRANSTIBIAL (DISPOSABLE) ×1 IMPLANT
KIT TURNOVER KIT A (KITS) IMPLANT
MANIFOLD NEPTUNE II (INSTRUMENTS) ×1 IMPLANT
NDL SAFETY ECLIP 18X1.5 (MISCELLANEOUS) ×1 IMPLANT
PACK ARTHROSCOPY DSU (CUSTOM PROCEDURE TRAY) ×1 IMPLANT
PASSER SUT SWANSON 36MM LOOP (INSTRUMENTS) IMPLANT
PENCIL SMOKE EVACUATOR (MISCELLANEOUS) IMPLANT
PORT APPOLLO RF 90DEGREE MULTI (SURGICAL WAND) ×1 IMPLANT
SPONGE T-LAP 4X18 ~~LOC~~+RFID (SPONGE) ×1 IMPLANT
STRIP CLOSURE SKIN 1/2X4 (GAUZE/BANDAGES/DRESSINGS) ×1 IMPLANT
SUCTION FRAZIER HANDLE 10FR (MISCELLANEOUS) ×1
SUCTION TUBE FRAZIER 10FR DISP (MISCELLANEOUS) ×1 IMPLANT
SUT ETHILON 3 0 PS 1 (SUTURE) ×1 IMPLANT
SUT FIBERWIRE #2 38 T-5 BLUE (SUTURE)
SUT MNCRL AB 4-0 PS2 18 (SUTURE) IMPLANT
SUT VIC AB 0 CT1 27 (SUTURE)
SUT VIC AB 0 CT1 27XBRD ANBCTR (SUTURE) IMPLANT
SUT VIC AB 1 CT1 27 (SUTURE) ×1
SUT VIC AB 1 CT1 27XBRD ANBCTR (SUTURE) ×1 IMPLANT
SUT VIC AB 2-0 CT1 27 (SUTURE) ×1
SUT VIC AB 2-0 CT1 TAPERPNT 27 (SUTURE) IMPLANT
SUT VIC AB 2-0 SH 27 (SUTURE)
SUT VIC AB 2-0 SH 27XBRD (SUTURE) IMPLANT
SUT VIC AB 3-0 FS2 27 (SUTURE) IMPLANT
SUT VIC AB 3-0 SH 27 (SUTURE)
SUT VIC AB 3-0 SH 27X BRD (SUTURE) IMPLANT
SUTURE FIBERWR #2 38 T-5 BLUE (SUTURE) IMPLANT
SUTURE TAPE 1.3 FIBERLOP 20 ST (SUTURE) IMPLANT
SUTURETAPE 1.3 FIBERLOOP 20 ST (SUTURE)
SYR 10ML LL (SYRINGE) ×1 IMPLANT
TISSUE GRAFTLINK FGL (Tissue) ×1 IMPLANT
TOWEL OR 17X26 10 PK STRL BLUE (TOWEL DISPOSABLE) ×2 IMPLANT
TUBING ARTHROSCOPY IRRIG 16FT (MISCELLANEOUS) ×1 IMPLANT
TUBING CONNECTING 10 (TUBING) ×1 IMPLANT
WRAP KNEE MAXI GEL POST OP (GAUZE/BANDAGES/DRESSINGS) ×1 IMPLANT
YANKAUER SUCT BULB TIP 10FT TU (MISCELLANEOUS) IMPLANT

## 2022-05-11 NOTE — Discharge Instructions (Signed)
Diet: As you were doing prior to hospitalization   Activity: Increase activity slowly as tolerated  No lifting or driving for 6 weeks   Shower: May shower without a dressing on post op day #5, NO SOAKING in tub   Dressing: You may change your dressing on post op day #3.  Then change the dressing daily with sterile 4"x4"s gauze dressing  And TED hose for knees. Use paper tape to hold dressing in place  For hips. You may clean the incision with alcohol prior to redressing.   Weight Bearing: weight bearing as taught in physical therapy. Use a walker or  Crutches as instructed. In knee immobilizer.  To prevent constipation: you may use a stool softener such as -  Colace ( over the counter) 100 mg by mouth twice a day  Drink plenty of fluids ( prune juice may be helpful) and high fiber foods  Miralax ( over the counter) for constipation as needed.   Precautions: If you experience chest pain or shortness of breath - call 911 immediately For transfer to the hospital emergency department!!  If you develop a fever greater that 101 F, purulent drainage from wound, increased redness or drainage from wound, or calf pain -- Call the office   Follow- Up Appointment: Please call for an appointment to be seen in 2 weeks  [place] - [phone]

## 2022-05-11 NOTE — Brief Op Note (Signed)
05/11/2022  9:59 AM  PATIENT:  Cindy Phillips  31 y.o. female  PRE-OPERATIVE DIAGNOSIS:  right knee ACL tear  POST-OPERATIVE DIAGNOSIS:  right knee ACL tear  PROCEDURE:  Procedure(s): RIGHT KNEE ARTHROSCOPY; LATERAL MENISECTOMY; ANTERIOR CRUCIATE LIGAMENT (ACL) REPAIR; CHONDROPLASTY (Right)  SURGEON:  Surgeon(s) and Role:    Earlie Server, MD - Primary  PHYSICIAN ASSISTANT: Chriss Czar, PA-c  ASSISTANTS:    ANESTHESIA:   regional and general  EBL:  50 mL   BLOOD ADMINISTERED:none  DRAINS: none   LOCAL MEDICATIONS USED:  NONE  SPECIMEN:  No Specimen  DISPOSITION OF SPECIMEN:  N/A  COUNTS:  YES  TOURNIQUET:  * No tourniquets in log *  DICTATION: .Other Dictation: Dictation Number unknown  PLAN OF CARE: Discharge to home after PACU  PATIENT DISPOSITION:  PACU - hemodynamically stable.   Delay start of Pharmacological VTE agent (>24hrs) due to surgical blood loss or risk of bleeding: yes

## 2022-05-11 NOTE — Op Note (Signed)
NAMERAINI, Cindy Phillips ACCOUNT NO: [id number] DATE OF BIRTH: 1991-06-29 FACILITY: [facility] LOCATION: [facility] PHYSICIAN: W D. Valeta Harms., MD  Operative Report   DATE OF PROCEDURE: 05/11/2022  PREOPERATIVE DIAGNOSES: 1.  Complete interstitial tear, anterior cruciate ligament, right knee. 2.  Radial tear, lateral meniscus, right knee. 3.  Early osteoarthritis, patellofemoral joint, lateral compartment.  POSTOPERATIVE DIAGNOSES: 1.  Complete interstitial tear, anterior cruciate ligament, right knee. 2.  Radial tear, lateral meniscus, right knee. 3.  Early osteoarthritis, patellofemoral joint, lateral compartment.  OPERATIONS PERFORMED: 1.  Arthrex GraftLink allograft hamstring knee reconstruction, anterior cruciate ligament knee reconstruction. 2.  Partial lateral meniscectomy (15-20%). 3.  Chondroplasty, patellofemoral joint, lateral femoral condyle.  SURGEON:  Yvette Rack., MD  ASSISTANTShirlean Schlein.  ANESTHESIA:  General with block.  DESCRIPTION OF PROCEDURE:  Examination under anesthesia showed 4+ Lachman.  Arthroscope through inferomedial and inferolateral portals.  Systematic inspection of the knee showed complete interstitial tear of the ACL.  Mild chondromalacia of the patella  was noted, early osteoarthritis, grade II, early grade III debrided.  Medial meniscus on the medial side of the knee was normal.  Small radial tear was noted of the lateral meniscus, requiring about a 15-20% meniscectomy.  Moderate chondral irregularity  of about 50% of the weightbearing surface with grade II and early grade III changes were debrided.  We prepared a GraftLink allograft reconstruction approximately 9.5 mm in diameter, 70 mm in length on the back table.  We performed a notchplasty on the  femur for an A-frame notch.  We then used the Arthrex guide system on the femoral side to create a blind tunnel intraarticularly 9.5 mm in diameter, 30 mm in depth.  This  was done in approximately the 11 o'clock position for the right knee.  We did a  transtibial guide pin on the tibia and overreamed it for a diameter of 10 mm.  We confirmed position of the pins with C-arm fluoroscopy.  The graft was then passed transtibially into the femoral hole.  We confirmed deployment of the Endobutton on both  the femur and the tibia.  The graft was tensioned with resolution of the Lachman.  We confirmed position of the graft with no impingement by visualization with the arthroscope.  We also confirmed adequate deployment of the Endobuttons on both sides of  the femur and the tibia via the mini C-arm.  Small incisions were made along the anterolateral metaphysis of the femur and the anterior medial metaphysis of the tibia and all arthroscopy portals as well.  One arthroscopy portal was enlarged to  accommodate the guide.  Closure was affected with Vicryl and nylon.  Taken to recovery room in stable condition.   MUK D: 05/11/2022 9:50:41 am T: 05/11/2022 10:00:00 am  JOB: [id number]/ [id number]

## 2022-05-11 NOTE — Anesthesia Postprocedure Evaluation (Signed)
Anesthesia Post Note  Patient: Cindy Phillips  Procedure(s) Performed: RIGHT KNEE ARTHROSCOPY; LATERAL MENISECTOMY; ANTERIOR CRUCIATE LIGAMENT (ACL) REPAIR; CHONDROPLASTY (Right: Knee)     Patient location during evaluation: PACU Anesthesia Type: General Level of consciousness: sedated Pain management: pain level controlled Vital Signs Assessment: post-procedure vital signs reviewed and stable Respiratory status: spontaneous breathing and respiratory function stable Cardiovascular status: stable Postop Assessment: no apparent nausea or vomiting Anesthetic complications: no   No notable events documented.  Last Vitals:  Vitals:   05/11/22 1030 05/11/22 1045  BP: (!) 142/77 (!) 140/81  Pulse: 91 88  Resp: 16 15  Temp:  36.5 C  SpO2: 96% 94%    Last Pain:  Vitals:   05/11/22 1045  TempSrc:   PainSc: Asleep                 [name] DANIEL

## 2022-05-11 NOTE — Interval H&P Note (Signed)
History and Physical Interval Note:  05/11/2022 7:33 AM  Cindy Phillips  has presented today for surgery, with the diagnosis of right knee ACL tear.  The various methods of treatment have been discussed with the patient and family. After consideration of risks, benefits and other options for treatment, the patient has consented to  Procedure(s): ANTERIOR CRUCIATE LIGAMENT (ACL) REPAIR (Right) as a surgical intervention.  The patient's history has been reviewed, patient examined, no change in status, stable for surgery.  I have reviewed the patient's chart and labs.  Questions were answered to the patient's satisfaction.     Yvette Rack

## 2022-05-11 NOTE — Transfer of Care (Signed)
Immediate Anesthesia Transfer of Care Note  Patient: [name]  Procedure(s) Performed: RIGHT KNEE ARTHROSCOPY; LATERAL MENISECTOMY; ANTERIOR CRUCIATE LIGAMENT (ACL) REPAIR; CHONDROPLASTY (Right: Knee)  Patient Location: PACU  Anesthesia Type:General  Level of Consciousness: awake, alert  and patient cooperative  Airway & Oxygen Therapy: Patient Spontanous Breathing and Patient connected to face mask oxygen  Post-op Assessment: Report given to RN and Post -op Vital signs reviewed and stable  Post vital signs: Reviewed and stable  Last Vitals:  Vitals Value Taken Time  BP 146/74 05/11/22 1002  Temp    Pulse 102 05/11/22 1005  Resp 22 05/11/22 1005  SpO2 100 % 05/11/22 1005  Vitals shown include unvalidated device data.  Last Pain:  Vitals:   05/11/22 0619  TempSrc:   PainSc: 0-No pain      Patients Stated Pain Goal: 3 (58/30/94 0768)  Complications: No notable events documented.

## 2022-05-11 NOTE — Anesthesia Procedure Notes (Signed)
Procedure Name: Intubation Date/Time: 05/11/2022 7:45 AM  Performed by: Eben Burow, CRNAPre-anesthesia Checklist: Patient identified, Emergency Drugs available, Suction available, Patient being monitored and Timeout performed Patient Re-evaluated:Patient Re-evaluated prior to induction Oxygen Delivery Method: Circle system utilized Preoxygenation: Pre-oxygenation with 100% oxygen Induction Type: IV induction Ventilation: Mask ventilation without difficulty Laryngoscope Size: Mac and 4 Grade View: Grade I Tube type: Oral Number of attempts: 1 Airway Equipment and Method: Stylet Placement Confirmation: ETT inserted through vocal cords under direct vision, positive ETCO2 and breath sounds checked- equal and bilateral Secured at: 22 cm Tube secured with: Tape Dental Injury: Teeth and Oropharynx as per pre-operative assessment

## 2022-05-14 ENCOUNTER — Ambulatory Visit: Payer: Medicare Other | Attending: Specialist

## 2022-05-14 DIAGNOSIS — M25561 Pain in right knee: Secondary | ICD-10-CM | POA: Insufficient documentation

## 2022-05-14 DIAGNOSIS — M6281 Muscle weakness (generalized): Secondary | ICD-10-CM | POA: Diagnosis not present

## 2022-05-14 DIAGNOSIS — R2689 Other abnormalities of gait and mobility: Secondary | ICD-10-CM | POA: Diagnosis not present

## 2022-05-14 NOTE — Therapy (Signed)
OUTPATIENT PHYSICAL THERAPY LOWER EXTREMITY EVALUATION   Patient Name: Cindy Phillips MRN: [id number] DOB:20-Aug-1991, 31 y.o., female Today's Date: 05/15/2022   PT End of Session - 05/14/22 1633     Visit Number 1    Number of Visits 24    Date for PT Re-Evaluation 08/06/22    Authorization Type UHC Medicare    PT Start Time 1615    PT Stop Time 1640    PT Time Calculation (min) 25 min             Past Medical History:  Diagnosis Date   Arthritis    Bipolar 1 disorder ([place])    Bowel incontinence    Depression    Diabetes mellitus without complication ([place])    Headache    migraines   Mental disability    Sleep apnea    Past Surgical History:  Procedure Laterality Date   ANTERIOR CRUCIATE LIGAMENT REPAIR Right 05/11/2022   Procedure: RIGHT KNEE ARTHROSCOPY; LATERAL MENISECTOMY; ANTERIOR CRUCIATE LIGAMENT (ACL) REPAIR; CHONDROPLASTY;  Surgeon: Earlie Server, MD;  Location: [facility];  Service: Orthopedics;  Laterality: Right;   FOOT SURGERY     KNEE ARTHROSCOPY W/ ACL RECONSTRUCTION Left 10/12/2020   TOOTH EXTRACTION     Patient Active Problem List   Diagnosis Date Noted   Pain in left elbow 07/27/2020   Prediabetes 02/03/2019   Obstructive sleep apnea 04/23/2018   Other fatigue 11/26/2017   Shortness of breath on exertion 11/26/2017   Type 2 diabetes mellitus without complication, without long-term current use of insulin ([place]) 11/26/2017   Other hyperlipidemia 11/26/2017   Vitamin D deficiency 11/26/2017   Schizoaffective disorder, bipolar type ([place]) 09/14/2014   Intellectual disability 09/10/2014    PCP: Lucianne Lei, MD  REFERRING PROVIDER: Earlie Server, MD  REFERRING DIAG: POST OP RIGHT KNEE SCOPE ACL  05/11/22  THERAPY DIAG:  Acute pain of right knee - Plan: PT plan of care cert/re-cert  Muscle weakness (generalized) - Plan: PT plan of care cert/re-cert  Other abnormalities of gait and mobility - Plan: PT plan of care cert/re-cert  Rationale  for Evaluation and Treatment Rehabilitation  ONSET DATE: 05/11/2022  SUBJECTIVE:   SUBJECTIVE STATEMENT: Pt presents to PT s/p R ACL repair with hamstring autograft by Dr. French Ana on 05/11/2022. She discharged home from the hospital and has been having a good bit of pain. Has not removed her post op brace since leaving hospital. Pt is well known by PT and has had previous L ACL repair in 2022. States she was not given a hinged knee brace at hospital.   PERTINENT HISTORY: DM II, L ACL repair, intellectual disability  PAIN:  Are you having pain?  Yes: NPRS scale: 6/10 (7/10 at worst) Pain location: R knee Pain description: post surgery Aggravating factors: walking Relieving factors: rest, medication  PRECAUTIONS: Knee  WEIGHT BEARING RESTRICTIONS Yes: WBAT  FALLS:  Has patient fallen in last 6 months? No  LIVING ENVIRONMENT: Lives with: lives with their family Lives in: House/apartment Stairs: No Has following equipment at home: Crutches  OCCUPATION: Not working  PLOF: Independent and Independent with basic ADLs  PATIENT GOALS: improve mobility and R knee pain, get back to playing basketball    OBJECTIVE:   DIAGNOSTIC FINDINGS:   See imaging   PATIENT SURVEYS:  FOTO: will assess next session  COGNITION:  Overall cognitive status: Within functional limits for tasks assessed    SENSATION: WFL  POSTURE:  Large body habitus, increased lumbar lordosis, rounded  shoulders, fwd head  PALPATION: N/A  LOWER EXTREMITY ROM:  Active ROM Right eval Left eval  Hip flexion    Hip extension    Hip abduction    Hip adduction    Hip internal rotation    Hip external rotation    Knee flexion 65   Knee extension 10   Ankle dorsiflexion    Ankle plantarflexion    Ankle inversion    Ankle eversion     (Blank rows = not tested)  LOWER EXTREMITY MMT:  MMT Right eval Left eval  Hip flexion DNT 4/5  Hip extension    Hip abduction DNT 4/5  Hip adduction DNT 4/5   Hip internal rotation    Hip external rotation    Knee flexion DNT 4/5  Knee extension DNT 4/5  Ankle dorsiflexion    Ankle plantarflexion    Ankle inversion    Ankle eversion     (Blank rows = not tested)  FUNCTIONAL TESTS:  30 Second Sit to Stand: 3 reps  GAIT: Distance walked: 37f Assistive device utilized: Crutches Level of assistance: Modified independence Comments: antalgic gait on R, brace on R LE  TODAY'S TREATMENT: [facility] Adult PT Treatment:                                                DATE: 05/14/2022 Therapeutic Exercise: R quad set x 10 - 5" hold R heel slide x 5 - 5" hold Prone knee hang x 30" R  PATIENT EDUCATION:  Education details: eval findings, HEP, POC Person educated: Patient Education method: Explanation, Demonstration, and Handouts Education comprehension: verbalized understanding and returned demonstration   HOME EXERCISE PROGRAM: Access Code: [email or web address]: [email or web address] Date: 05/15/2022 Prepared by: DOctavio Manns Exercises - Long Sitting Quad Set  - 4-5 x daily - 7 x weekly - 3 sets - 10 reps - 5 sec hold - Supine Heel Slide  - 4-5 x daily - 7 x weekly - 3 sets - 10 reps - 5 sec hold  ASSESSMENT:  CLINICAL IMPRESSION: Patient is a 31y.o. F who was seen today for physical therapy evaluation and treatment s/p R ACL repair with hamstring autograft on 05/11/2022. Physical findings are consistent with surgery and recovery timeline with decreased mobility and R knee ROM. Her 30 Second Sit to Stand indicates decrease in funcitonal mobility and incrased fall risk. She would benefit from skilled PT services working on improving knee strength and range post sugery.    OBJECTIVE IMPAIRMENTS Abnormal gait, decreased activity tolerance, decreased balance, decreased endurance, decreased mobility, difficulty walking, decreased ROM, decreased strength, and pain  ACTIVITY LIMITATIONS carrying, lifting, standing, squatting, stairs,  transfers, and bed mobility  PARTICIPATION LIMITATIONS: meal prep, cleaning, driving, shopping, community activity, and yard work  PERSONAL FACTORS Past/current experiences, Time since onset of injury/illness/exacerbation, and 3+ comorbidities: DM II, L ACL repair, intellectual disability  are also affecting patient's functional outcome.   REHAB POTENTIAL: Excellent  CLINICAL DECISION MAKING: Stable/uncomplicated  EVALUATION COMPLEXITY: Low   GOALS: Goals reviewed with patient? No  SHORT TERM GOALS: Target date: 06/05/2022  Pt will be compliant and knowledgeable with initial HEP for improved comfort and carryover Baseline: initial HEP given  Goal status: INITIAL  2.  Pt will self report right knee pain no greater than 5/10 for improved comfort and functional ability Baseline: 7/10 at  worst Goal status: INITIAL   LONG TERM GOALS: Target date: 08/07/2022   Pt will self report right knee pain no greater than 1-2/10 for improved comfort and functional ability Baseline: 7/10 at worst Goal status: INITIAL  2.  Pt will improve FOTO function score to no less than predicted as proxy for functional improvement Baseline: will assess next session Goal status: INITIAL   3.  Pt will improve R knee AROM to no less than range of 0-120 degrees for improved function mobility Baseline: see chart Goal status: INITIAL  4.  Pt will increase 30 Second Sit to Stand rep count to no less than 10 reps for improved balance, strength, and functional mobility Baseline: 3 reps  Goal status: INITIAL   PLAN: PT FREQUENCY: 2x/week  PT DURATION: 12 weeks  PLANNED INTERVENTIONS: Therapeutic exercises, Therapeutic activity, Neuromuscular re-education, Balance training, Gait training, Patient/Family education, Self Care, Joint mobilization, Electrical stimulation, Cryotherapy, Moist heat, Vasopneumatic device, Manual therapy, and Re-evaluation  PLAN FOR NEXT SESSION: assess HEP response, quad  strengthening, ESTIM R quad, progress per protocol    Ward Chatters, PT 05/15/2022, 1:41 PM

## 2022-05-15 ENCOUNTER — Encounter (HOSPITAL_COMMUNITY): Payer: Self-pay | Admitting: Orthopedic Surgery

## 2022-05-21 DIAGNOSIS — S83281D Other tear of lateral meniscus, current injury, right knee, subsequent encounter: Secondary | ICD-10-CM | POA: Diagnosis not present

## 2022-05-21 DIAGNOSIS — S83241D Other tear of medial meniscus, current injury, right knee, subsequent encounter: Secondary | ICD-10-CM | POA: Diagnosis not present

## 2022-05-22 NOTE — Therapy (Signed)
OUTPATIENT PHYSICAL THERAPY TREATMENT NOTE   Patient Name: Cindy Phillips MRN: [id number] DOB:September 13, 1990, 31 y.o., female Today's Date: 05/23/2022  PCP: Lucianne Lei, MD REFERRING PROVIDER: Earlie Server, MD  END OF SESSION:   PT End of Session - 05/23/22 1600     Visit Number 2    Number of Visits 24    Date for PT Re-Evaluation 08/06/22    Authorization Type UHC Medicare    PT Start Time 1615    PT Stop Time 1645    PT Time Calculation (min) 30 min    Activity Tolerance Patient tolerated treatment well    Behavior During Therapy WFL for tasks assessed/performed             Past Medical History:  Diagnosis Date   Arthritis    Bipolar 1 disorder ([place])    Bowel incontinence    Depression    Diabetes mellitus without complication ([place])    Headache    migraines   Mental disability    Sleep apnea    Past Surgical History:  Procedure Laterality Date   ANTERIOR CRUCIATE LIGAMENT REPAIR Right 05/11/2022   Procedure: RIGHT KNEE ARTHROSCOPY; LATERAL MENISECTOMY; ANTERIOR CRUCIATE LIGAMENT (ACL) REPAIR; CHONDROPLASTY;  Surgeon: Earlie Server, MD;  Location: [facility];  Service: Orthopedics;  Laterality: Right;   FOOT SURGERY     KNEE ARTHROSCOPY W/ ACL RECONSTRUCTION Left 10/12/2020   TOOTH EXTRACTION     Patient Active Problem List   Diagnosis Date Noted   Pain in left elbow 07/27/2020   Prediabetes 02/03/2019   Obstructive sleep apnea 04/23/2018   Other fatigue 11/26/2017   Shortness of breath on exertion 11/26/2017   Type 2 diabetes mellitus without complication, without long-term current use of insulin ([place]) 11/26/2017   Other hyperlipidemia 11/26/2017   Vitamin D deficiency 11/26/2017   Schizoaffective disorder, bipolar type ([place]) 09/14/2014   Intellectual disability 09/10/2014    REFERRING DIAG: POST OP RIGHT KNEE SCOPE ACL  05/11/22  THERAPY DIAG:  Acute pain of right knee  Muscle weakness (generalized)  Other abnormalities of gait and  mobility  Rationale for Evaluation and Treatment Rehabilitation  PERTINENT HISTORY: DM II, L ACL repair, intellectual disability  PRECAUTIONS: Knee  SUBJECTIVE: Pt presents to PT with reports of decreased knee pain. Has been compliant with HEP with no adverse effect. Has a stabilizing brace today, was told by MD to put this on. Pt is ready to begin PT a this time.   PAIN:  Are you having pain?  Yes: NPRS scale: 6/10 (7/10 at worst) Pain location: R knee Pain description: post surgery Aggravating factors: walking Relieving factors: rest, medication   OBJECTIVE: (objective measures completed at initial evaluation unless otherwise dated)  DIAGNOSTIC FINDINGS:            See imaging    PATIENT SURVEYS:  FOTO: will assess next session   COGNITION:           Overall cognitive status: Within functional limits for tasks assessed                          SENSATION: WFL   POSTURE:  Large body habitus, increased lumbar lordosis, rounded shoulders, fwd head   PALPATION: N/A   LOWER EXTREMITY ROM:   Active ROM Right eval Left eval  Hip flexion      Hip extension      Hip abduction      Hip adduction  Hip internal rotation      Hip external rotation      Knee flexion 65    Knee extension 10    Ankle dorsiflexion      Ankle plantarflexion      Ankle inversion      Ankle eversion       (Blank rows = not tested)   LOWER EXTREMITY MMT:   MMT Right eval Left eval  Hip flexion DNT 4/5  Hip extension      Hip abduction DNT 4/5  Hip adduction DNT 4/5  Hip internal rotation      Hip external rotation      Knee flexion DNT 4/5  Knee extension DNT 4/5  Ankle dorsiflexion      Ankle plantarflexion      Ankle inversion      Ankle eversion       (Blank rows = not tested)   FUNCTIONAL TESTS:  30 Second Sit to Stand: 3 reps   GAIT: Distance walked: 69f Assistive device utilized: Crutches Level of assistance: Modified independence Comments: antalgic gait on R,  brace on R LE   TODAY'S TREATMENT: [facility] Adult PT Treatment:                                                DATE: 05/23/2022 Therapeutic Exercise: Rec bike no resistance x 4 min for ROM R quad set 2x15 - 5" hold R heel slide 2x10 - 5" hold Bridge 2x10  Modalities: Russian ESTIM 55MHz x 10 min R quad 10on/10off  [facility] Adult PT Treatment:                                                DATE: 05/14/2022 Therapeutic Exercise: R quad set x 10 - 5" hold R heel slide x 5 - 5" hold Prone knee hang x 30" R   PATIENT EDUCATION:  Education details: eval findings, HEP, POC Person educated: Patient Education method: Explanation, Demonstration, and Handouts Education comprehension: verbalized understanding and returned demonstration     HOME EXERCISE PROGRAM: Access Code: [email or web address]: [email or web address] Date: 05/15/2022 Prepared by: DOctavio Manns  Exercises - Long Sitting Quad Set  - 4-5 x daily - 7 x weekly - 3 sets - 10 reps - 5 sec hold - Supine Heel Slide  - 4-5 x daily - 7 x weekly - 3 sets - 10 reps - 5 sec hold   ASSESSMENT:   CLINICAL IMPRESSION: Pt was able to complete all prescribed exercises with no adverse effect. Therapy focused on improving quad control and R knee ROM. Is progressing per protocol, will continue per POC.      OBJECTIVE IMPAIRMENTS Abnormal gait, decreased activity tolerance, decreased balance, decreased endurance, decreased mobility, difficulty walking, decreased ROM, decreased strength, and pain   ACTIVITY LIMITATIONS carrying, lifting, standing, squatting, stairs, transfers, and bed mobility   PARTICIPATION LIMITATIONS: meal prep, cleaning, driving, shopping, community activity, and yard work   PERSONAL FACTORS Past/current experiences, Time since onset of injury/illness/exacerbation, and 3+ comorbidities: DM II, L ACL repair, intellectual disability  are also affecting patient's functional outcome.    REHAB POTENTIAL: Excellent    CLINICAL DECISION MAKING: Stable/uncomplicated   EVALUATION COMPLEXITY: Low  GOALS: Goals reviewed with patient? No   SHORT TERM GOALS: Target date: 06/05/2022  Pt will be compliant and knowledgeable with initial HEP for improved comfort and carryover Baseline: initial HEP given  Goal status: INITIAL   2.  Pt will self report right knee pain no greater than 5/10 for improved comfort and functional ability Baseline: 7/10 at worst Goal status: INITIAL    LONG TERM GOALS: Target date: 08/07/2022    Pt will self report right knee pain no greater than 1-2/10 for improved comfort and functional ability Baseline: 7/10 at worst Goal status: INITIAL   2.  Pt will improve FOTO function score to no less than predicted as proxy for functional improvement Baseline: will assess next session Goal status: INITIAL    3.  Pt will improve R knee AROM to no less than range of 0-120 degrees for improved function mobility Baseline: see chart Goal status: INITIAL   4.  Pt will increase 30 Second Sit to Stand rep count to no less than 10 reps for improved balance, strength, and functional mobility Baseline: 3 reps  Goal status: INITIAL    PLAN: PT FREQUENCY: 2x/week   PT DURATION: 12 weeks   PLANNED INTERVENTIONS: Therapeutic exercises, Therapeutic activity, Neuromuscular re-education, Balance training, Gait training, Patient/Family education, Self Care, Joint mobilization, Electrical stimulation, Cryotherapy, Moist heat, Vasopneumatic device, Manual therapy, and Re-evaluation   PLAN FOR NEXT SESSION: assess HEP response, quad strengthening, ESTIM R quad, progress per protocol     Ward Chatters, PT 05/23/2022, 5:41 PM

## 2022-05-23 ENCOUNTER — Ambulatory Visit: Payer: Medicare Other | Attending: Specialist

## 2022-05-23 DIAGNOSIS — G8929 Other chronic pain: Secondary | ICD-10-CM | POA: Insufficient documentation

## 2022-05-23 DIAGNOSIS — M25561 Pain in right knee: Secondary | ICD-10-CM

## 2022-05-23 DIAGNOSIS — M25562 Pain in left knee: Secondary | ICD-10-CM | POA: Insufficient documentation

## 2022-05-23 DIAGNOSIS — M6281 Muscle weakness (generalized): Secondary | ICD-10-CM | POA: Diagnosis not present

## 2022-05-23 DIAGNOSIS — R2689 Other abnormalities of gait and mobility: Secondary | ICD-10-CM

## 2022-05-28 ENCOUNTER — Ambulatory Visit (INDEPENDENT_AMBULATORY_CARE_PROVIDER_SITE_OTHER): Payer: Medicare Other | Admitting: Specialist

## 2022-05-28 ENCOUNTER — Encounter: Payer: Self-pay | Admitting: Specialist

## 2022-05-28 VITALS — BP 152/97 | Ht 72.0 in | Wt 350.0 lb

## 2022-05-28 DIAGNOSIS — M4726 Other spondylosis with radiculopathy, lumbar region: Secondary | ICD-10-CM | POA: Diagnosis not present

## 2022-05-28 DIAGNOSIS — M5116 Intervertebral disc disorders with radiculopathy, lumbar region: Secondary | ICD-10-CM

## 2022-05-28 DIAGNOSIS — M5137 Other intervertebral disc degeneration, lumbosacral region: Secondary | ICD-10-CM | POA: Diagnosis not present

## 2022-05-28 NOTE — Patient Instructions (Signed)
Plan: Avoid bending, stooping and avoid lifting weights greater than 10 lbs. Avoid prolong standing and walking. Avoid frequent bending and stooping  No lifting greater than 10-15 lbs. May use ice or moist heat for pain. Weight loss is of benefit. PT for exacerbation of degenerative disc L5-S1.

## 2022-05-28 NOTE — Progress Notes (Signed)
Office Visit Note   Patient: Cindy Phillips           Date of Birth: Jan 14, 1991           MRN: [id number] Visit Date: 05/28/2022              Requested by: Lucianne Lei, [address] [place],  [place] [address] PCP: Lucianne Lei, MD   Assessment & Plan: Visit Diagnoses:  1. Radiculopathy due to lumbar intervertebral disc disorder   2. Disc disease, degenerative, lumbar or lumbosacral   3. Other spondylosis with radiculopathy, lumbar region     Plan: Avoid bending, stooping and avoid lifting weights greater than 10 lbs. Avoid prolong standing and walking. Avoid frequent bending and stooping  No lifting greater than 10-15 lbs. May use ice or moist heat for pain. Weight loss is of benefit.   Follow-Up Instructions: No follow-ups on file.   Orders:  No orders of the defined types were placed in this encounter.  No orders of the defined types were placed in this encounter.     Procedures: No procedures performed   Clinical Data: Findings:  Narrative & Impression CLINICAL DATA:  Initial evaluation for low back pain with bilateral lower extremity radiculopathy.   EXAM: MRI LUMBAR SPINE WITHOUT CONTRAST   TECHNIQUE: Multiplanar, multisequence MR imaging of the lumbar spine was performed. No intravenous contrast was administered.   COMPARISON:  Prior radiograph from 05/17/2021.   FINDINGS: Segmentation: Standard. Lowest well-formed disc space labeled the L5-S1 level.   Alignment: Physiologic with preservation of the normal lumbar lordosis. No listhesis.   Vertebrae: Vertebral body height maintained without acute or chronic fracture. Bone marrow signal intensity within normal limits. No discrete or worrisome osseous lesions or abnormal marrow edema.   Conus medullaris and cauda equina: Conus extends to the T12-L1 level. Conus and cauda equina appear normal.   Paraspinal and other soft tissues: Unremarkable.   Disc levels:   Lumbar spine is  image from the T11-12 level inferiorly. No significant findings are seen through the L3-4 level.   L4-5: Normal interspace. Mild bilateral facet hypertrophy. No stenosis or impingement.   L5-S1: Disc desiccation. Superimposed small central disc protrusion with slight inferior migration (series 5, image 37). Disc material closely approximates the descending S1 nerve roots without frank neural impingement or displacement, slightly greater on the right. Mild left-sided facet arthrosis. No significant spinal stenosis. Foramina remain patent.   IMPRESSION: 1. Small central disc protrusion at L5-S1, closely approximating the descending S1 nerve roots without frank neural impingement or displacement, slightly greater on the right. 2. Mild bilateral facet hypertrophy at L4-5 and L5-S1.     Electronically Signed   By: Jeannine Boga M.D.   On: 09/10/2021 20:20        Subjective: Chief Complaint  Patient presents with   Lower Back - Pain, Follow-up    31 year old female with history of back pain and sciatica with DDD L5-S1 with spondylosis of the lumbar spine. MRI with mild DDD and mild changes associated with the disc at L5-S1. She reports right knee injury while playing basket ball a pick up game with some friends about 4-5 weeks ago when she torn the right knee ACL. Underwent ACL reconstruction by Dr. Princella Pellegrini 05/11/2022 and since then has had some increase in her back and leg pain.    Review of Systems  Constitutional: Negative.   HENT: Negative.    Eyes: Negative.   Respiratory:  Negative.    Cardiovascular: Negative.   Gastrointestinal: Negative.   Endocrine: Negative.   Genitourinary: Negative.   Musculoskeletal: Negative.   Skin: Negative.   Allergic/Immunologic: Negative.   Neurological: Negative.   Hematological: Negative.   Psychiatric/Behavioral: Negative.       Objective: Vital Signs: BP (!) 152/97   Ht 6' (1.829 m)   Wt (!) 350 lb (158.8 kg)   BMI  47.47 kg/m   Physical Exam Constitutional:      Appearance: She is well-developed.  HENT:     Head: Normocephalic and atraumatic.  Eyes:     Pupils: Pupils are equal, round, and reactive to light.  Pulmonary:     Effort: Pulmonary effort is normal.     Breath sounds: Normal breath sounds.  Abdominal:     General: Bowel sounds are normal.     Palpations: Abdomen is soft.  Musculoskeletal:        General: Normal range of motion.     Cervical back: Normal range of motion and neck supple.  Skin:    General: Skin is warm and dry.  Neurological:     Mental Status: She is alert and oriented to person, place, and time.  Psychiatric:        Behavior: Behavior normal.        Thought Content: Thought content normal.        Judgment: Judgment normal.     Ortho Exam  Specialty Comments:  No specialty comments available.  Imaging: No results found.   PMFS History: Patient Active Problem List   Diagnosis Date Noted   Pain in left elbow 07/27/2020   Prediabetes 02/03/2019   Obstructive sleep apnea 04/23/2018   Other fatigue 11/26/2017   Shortness of breath on exertion 11/26/2017   Type 2 diabetes mellitus without complication, without long-term current use of insulin ([place]) 11/26/2017   Other hyperlipidemia 11/26/2017   Vitamin D deficiency 11/26/2017   Schizoaffective disorder, bipolar type ([place]) 09/14/2014   Intellectual disability 09/10/2014   Past Medical History:  Diagnosis Date   Arthritis    Bipolar 1 disorder (HCC)    Bowel incontinence    Depression    Diabetes mellitus without complication (HCC)    Headache    migraines   Mental disability    Sleep apnea     Family History  Problem Relation Age of Onset   High blood pressure Mother    Anxiety disorder Mother    Diabetes Maternal Grandmother     Past Surgical History:  Procedure Laterality Date   ANTERIOR CRUCIATE LIGAMENT REPAIR Right 05/11/2022   Procedure: RIGHT KNEE ARTHROSCOPY; LATERAL  MENISECTOMY; ANTERIOR CRUCIATE LIGAMENT (ACL) REPAIR; CHONDROPLASTY;  Surgeon: Earlie Server, MD;  Location: [facility];  Service: Orthopedics;  Laterality: Right;   FOOT SURGERY     KNEE ARTHROSCOPY W/ ACL RECONSTRUCTION Left 10/12/2020   TOOTH EXTRACTION     Social History   Occupational History   Occupation: Clinical research associate     Comment: [employer]   Tobacco Use   Smoking status: Never   Smokeless tobacco: Never  Vaping Use   Vaping Use: Never used  Substance and Sexual Activity   Alcohol use: Not Currently   Drug use: No   Sexual activity: Not Currently

## 2022-05-29 ENCOUNTER — Ambulatory Visit: Payer: Medicare Other

## 2022-05-29 DIAGNOSIS — M6281 Muscle weakness (generalized): Secondary | ICD-10-CM | POA: Diagnosis not present

## 2022-05-29 DIAGNOSIS — M25561 Pain in right knee: Secondary | ICD-10-CM

## 2022-05-29 DIAGNOSIS — M25562 Pain in left knee: Secondary | ICD-10-CM | POA: Diagnosis not present

## 2022-05-29 DIAGNOSIS — R2689 Other abnormalities of gait and mobility: Secondary | ICD-10-CM

## 2022-05-29 DIAGNOSIS — G8929 Other chronic pain: Secondary | ICD-10-CM | POA: Diagnosis not present

## 2022-05-29 NOTE — Therapy (Signed)
OUTPATIENT PHYSICAL THERAPY TREATMENT NOTE   Patient Name: Cindy Phillips MRN: [id number] DOB:1991/05/04, 31 y.o., female Today's Date: 05/29/2022  PCP: Lucianne Lei, MD REFERRING PROVIDER: Earlie Server, MD  END OF SESSION:   PT End of Session - 05/29/22 1513     Visit Number 3    Number of Visits 24    Date for PT Re-Evaluation 08/06/22    Authorization Type UHC Medicare    PT Start Time 1530    PT Stop Time 1610    PT Time Calculation (min) 40 min    Activity Tolerance Patient tolerated treatment well    Behavior During Therapy WFL for tasks assessed/performed              Past Medical History:  Diagnosis Date   Arthritis    Bipolar 1 disorder ([place])    Bowel incontinence    Depression    Diabetes mellitus without complication ([place])    Headache    migraines   Mental disability    Sleep apnea    Past Surgical History:  Procedure Laterality Date   ANTERIOR CRUCIATE LIGAMENT REPAIR Right 05/11/2022   Procedure: RIGHT KNEE ARTHROSCOPY; LATERAL MENISECTOMY; ANTERIOR CRUCIATE LIGAMENT (ACL) REPAIR; CHONDROPLASTY;  Surgeon: Earlie Server, MD;  Location: [facility];  Service: Orthopedics;  Laterality: Right;   FOOT SURGERY     KNEE ARTHROSCOPY W/ ACL RECONSTRUCTION Left 10/12/2020   TOOTH EXTRACTION     Patient Active Problem List   Diagnosis Date Noted   Pain in left elbow 07/27/2020   Prediabetes 02/03/2019   Obstructive sleep apnea 04/23/2018   Other fatigue 11/26/2017   Shortness of breath on exertion 11/26/2017   Type 2 diabetes mellitus without complication, without long-term current use of insulin ([place]) 11/26/2017   Other hyperlipidemia 11/26/2017   Vitamin D deficiency 11/26/2017   Schizoaffective disorder, bipolar type ([place]) 09/14/2014   Intellectual disability 09/10/2014    REFERRING DIAG: POST OP RIGHT KNEE SCOPE ACL  05/11/22  THERAPY DIAG:  Acute pain of right knee  Muscle weakness (generalized)  Other abnormalities of gait and  mobility  Rationale for Evaluation and Treatment Rehabilitation  PERTINENT HISTORY: DM II, L ACL repair, intellectual disability  PRECAUTIONS: Knee  SUBJECTIVE: Patient reports her pain was very high yesterday but that it is doing better today.  PAIN:  Are you having pain?  Yes: NPRS scale: 4/10 (7/10 at worst) Pain location: R knee Pain description: post surgery Aggravating factors: walking Relieving factors: rest, medication   OBJECTIVE: (objective measures completed at initial evaluation unless otherwise dated)  DIAGNOSTIC FINDINGS:            See imaging    PATIENT SURVEYS:  FOTO: will assess next session   COGNITION:           Overall cognitive status: Within functional limits for tasks assessed                          SENSATION: WFL   POSTURE:  Large body habitus, increased lumbar lordosis, rounded shoulders, fwd head   PALPATION: N/A   LOWER EXTREMITY ROM:   Active ROM Right eval Left eval Right 05/29/22  Hip flexion       Hip extension       Hip abduction       Hip adduction       Hip internal rotation       Hip external rotation  Knee flexion 65   105  Knee extension 10     Ankle dorsiflexion       Ankle plantarflexion       Ankle inversion       Ankle eversion        (Blank rows = not tested)   LOWER EXTREMITY MMT:   MMT Right eval Left eval  Hip flexion DNT 4/5  Hip extension      Hip abduction DNT 4/5  Hip adduction DNT 4/5  Hip internal rotation      Hip external rotation      Knee flexion DNT 4/5  Knee extension DNT 4/5  Ankle dorsiflexion      Ankle plantarflexion      Ankle inversion      Ankle eversion       (Blank rows = not tested)   FUNCTIONAL TESTS:  30 Second Sit to Stand: 3 reps   GAIT: Distance walked: 48f Assistive device utilized: Crutches Level of assistance: Modified independence Comments: antalgic gait on R, brace on R LE   TODAY'S TREATMENT: [facility] Adult PT Treatment:                                                 DATE: 05/29/2022 Therapeutic Exercise: Rec bike no resistance x 5 min for ROM R quad set 2x15 - 5" hold R heel slide 2x10 - 5" hold Bridge 2x10  LAQ Lt 3x10 Attempted SLR, unable to lift  [facility]                                                DATE: 05/23/2022 Therapeutic Exercise: Rec bike no resistance x 4 min for ROM R quad set 2x15 - 5" hold R heel slide 2x10 - 5" hold Bridge 2x10  Modalities: Russian ESTIM 55MHz x 10 min R quad 10on/10off  [facility] Adult PT Treatment:                                                DATE: 05/14/2022 Therapeutic Exercise: R quad set x 10 - 5" hold R heel slide x 5 - 5" hold Prone knee hang x 30" R   PATIENT EDUCATION:  Education details: eval findings, HEP, POC Person educated: Patient Education method: Explanation, Demonstration, and Handouts Education comprehension: verbalized understanding and returned demonstration     HOME EXERCISE PROGRAM: Access Code: [email or web address]: [email or web address] Date: 05/15/2022 Prepared by: DOctavio Manns  Exercises - Long Sitting Quad Set  - 4-5 x daily - 7 x weekly - 3 sets - 10 reps - 5 sec hold - Supine Heel Slide  - 4-5 x daily - 7 x weekly - 3 sets - 10 reps - 5 sec hold   ASSESSMENT:   CLINICAL IMPRESSION: Patient presents to PT with continued pain in her Rt knee and reports HEP compliance. Session today focused on R knee ROM and quad strengthening. Unable to perform SLR due to quad weakness. Patient continues to benefit from skilled PT services and should be progressed as able to improve functional independence.  OBJECTIVE IMPAIRMENTS Abnormal gait, decreased activity tolerance, decreased balance, decreased endurance, decreased mobility, difficulty walking, decreased ROM, decreased strength, and pain   ACTIVITY LIMITATIONS carrying, lifting, standing, squatting, stairs, transfers, and bed mobility   PARTICIPATION LIMITATIONS: meal prep, cleaning,  driving, shopping, community activity, and yard work   PERSONAL FACTORS Past/current experiences, Time since onset of injury/illness/exacerbation, and 3+ comorbidities: DM II, L ACL repair, intellectual disability  are also affecting patient's functional outcome.    REHAB POTENTIAL: Excellent   CLINICAL DECISION MAKING: Stable/uncomplicated   EVALUATION COMPLEXITY: Low     GOALS: Goals reviewed with patient? No   SHORT TERM GOALS: Target date: 06/05/2022  Pt will be compliant and knowledgeable with initial HEP for improved comfort and carryover Baseline: initial HEP given  Goal status: INITIAL   2.  Pt will self report right knee pain no greater than 5/10 for improved comfort and functional ability Baseline: 7/10 at worst Goal status: INITIAL    LONG TERM GOALS: Target date: 08/07/2022    Pt will self report right knee pain no greater than 1-2/10 for improved comfort and functional ability Baseline: 7/10 at worst Goal status: INITIAL   2.  Pt will improve FOTO function score to no less than predicted as proxy for functional improvement Baseline: will assess next session Goal status: INITIAL    3.  Pt will improve R knee AROM to no less than range of 0-120 degrees for improved function mobility Baseline: see chart Goal status: INITIAL   4.  Pt will increase 30 Second Sit to Stand rep count to no less than 10 reps for improved balance, strength, and functional mobility Baseline: 3 reps  Goal status: INITIAL    PLAN: PT FREQUENCY: 2x/week   PT DURATION: 12 weeks   PLANNED INTERVENTIONS: Therapeutic exercises, Therapeutic activity, Neuromuscular re-education, Balance training, Gait training, Patient/Family education, Self Care, Joint mobilization, Electrical stimulation, Cryotherapy, Moist heat, Vasopneumatic device, Manual therapy, and Re-evaluation   PLAN FOR NEXT SESSION: assess HEP response, quad strengthening, ESTIM R quad, progress per protocol     Margarette Canada, PTA 05/29/2022, 4:09 PM

## 2022-05-31 ENCOUNTER — Ambulatory Visit: Payer: Medicare Other

## 2022-05-31 DIAGNOSIS — M25561 Pain in right knee: Secondary | ICD-10-CM

## 2022-05-31 DIAGNOSIS — G8929 Other chronic pain: Secondary | ICD-10-CM | POA: Diagnosis not present

## 2022-05-31 DIAGNOSIS — R2689 Other abnormalities of gait and mobility: Secondary | ICD-10-CM | POA: Diagnosis not present

## 2022-05-31 DIAGNOSIS — M6281 Muscle weakness (generalized): Secondary | ICD-10-CM | POA: Diagnosis not present

## 2022-05-31 DIAGNOSIS — M25562 Pain in left knee: Secondary | ICD-10-CM | POA: Diagnosis not present

## 2022-05-31 NOTE — Therapy (Signed)
OUTPATIENT PHYSICAL THERAPY TREATMENT NOTE   Patient Name: Cindy Phillips MRN: [id number] DOB:Jan 06, 1991, 31 y.o., female Today's Date: 05/31/2022  PCP: Lucianne Lei, MD REFERRING PROVIDER: Earlie Server, MD  END OF SESSION:   PT End of Session - 05/31/22 1603     Visit Number 4    Number of Visits 24    Date for PT Re-Evaluation 08/06/22    Authorization Type UHC Medicare    PT Start Time 1605    PT Stop Time 1645    PT Time Calculation (min) 40 min    Activity Tolerance Patient tolerated treatment well    Behavior During Therapy WFL for tasks assessed/performed              Past Medical History:  Diagnosis Date   Arthritis    Bipolar 1 disorder ([place])    Bowel incontinence    Depression    Diabetes mellitus without complication ([place])    Headache    migraines   Mental disability    Sleep apnea    Past Surgical History:  Procedure Laterality Date   ANTERIOR CRUCIATE LIGAMENT REPAIR Right 05/11/2022   Procedure: RIGHT KNEE ARTHROSCOPY; LATERAL MENISECTOMY; ANTERIOR CRUCIATE LIGAMENT (ACL) REPAIR; CHONDROPLASTY;  Surgeon: Earlie Server, MD;  Location: [facility];  Service: Orthopedics;  Laterality: Right;   FOOT SURGERY     KNEE ARTHROSCOPY W/ ACL RECONSTRUCTION Left 10/12/2020   TOOTH EXTRACTION     Patient Active Problem List   Diagnosis Date Noted   Pain in left elbow 07/27/2020   Prediabetes 02/03/2019   Obstructive sleep apnea 04/23/2018   Other fatigue 11/26/2017   Shortness of breath on exertion 11/26/2017   Type 2 diabetes mellitus without complication, without long-term current use of insulin ([place]) 11/26/2017   Other hyperlipidemia 11/26/2017   Vitamin D deficiency 11/26/2017   Schizoaffective disorder, bipolar type ([place]) 09/14/2014   Intellectual disability 09/10/2014    REFERRING DIAG: POST OP RIGHT KNEE SCOPE ACL  05/11/22  THERAPY DIAG:  Acute pain of right knee  Muscle weakness (generalized)  Other abnormalities of gait and  mobility  Rationale for Evaluation and Treatment Rehabilitation  PERTINENT HISTORY: DM II, L ACL repair, intellectual disability  PRECAUTIONS: Knee  SUBJECTIVE: Patient reports increased pain today.   PAIN:  Are you having pain?  Yes: NPRS scale: 7/10 (7/10 at worst) Pain location: R knee Pain description: post surgery Aggravating factors: walking Relieving factors: rest, medication   OBJECTIVE: (objective measures completed at initial evaluation unless otherwise dated)  DIAGNOSTIC FINDINGS:            See imaging    PATIENT SURVEYS:  FOTO: will assess next session   COGNITION:           Overall cognitive status: Within functional limits for tasks assessed                          SENSATION: WFL   POSTURE:  Large body habitus, increased lumbar lordosis, rounded shoulders, fwd head   PALPATION: N/A   LOWER EXTREMITY ROM:   Active ROM Right eval Left eval Right 05/29/22  Hip flexion       Hip extension       Hip abduction       Hip adduction       Hip internal rotation       Hip external rotation       Knee flexion 65   105  Knee extension  10     Ankle dorsiflexion       Ankle plantarflexion       Ankle inversion       Ankle eversion        (Blank rows = not tested)   LOWER EXTREMITY MMT:   MMT Right eval Left eval  Hip flexion DNT 4/5  Hip extension      Hip abduction DNT 4/5  Hip adduction DNT 4/5  Hip internal rotation      Hip external rotation      Knee flexion DNT 4/5  Knee extension DNT 4/5  Ankle dorsiflexion      Ankle plantarflexion      Ankle inversion      Ankle eversion       (Blank rows = not tested)   FUNCTIONAL TESTS:  30 Second Sit to Stand: 3 reps   GAIT: Distance walked: 36f Assistive device utilized: Crutches Level of assistance: Modified independence Comments: antalgic gait on R, brace on R LE   TODAY'S TREATMENT: [facility] Adult PT Treatment:                                                DATE:  05/31/2022 Therapeutic Exercise: Rec bike no resistance x 5 min for ROM R quad set 2x15 - 5" hold R heel slide 2x10 - 5" hold Bridge 2x10  SLR x10 (small range) Prone knee hang 2x30" R   [facility] Adult PT Treatment:                                                DATE: 05/29/2022 Therapeutic Exercise: Rec bike no resistance x 5 min for ROM R quad set 2x15 - 5" hold R heel slide 2x10 - 5" hold Bridge 2x10  LAQ Lt 3x10 Attempted SLR, unable to lift  [facility]                                                [facility]: 05/23/2022 Therapeutic Exercise: Rec bike no resistance x 4 min for ROM R quad set 2x15 - 5" hold R heel slide 2x10 - 5" hold Bridge 2x10  Modalities: Russian ESTIM 55MHz x 10 min R quad 10on/10off  [facility] Adult PT Treatment:                                                DATE: 05/14/2022 Therapeutic Exercise: R quad set x 10 - 5" hold R heel slide x 5 - 5" hold Prone knee hang x 30" R   PATIENT EDUCATION:  Education details: eval findings, HEP, POC Person educated: Patient Education method: Explanation, Demonstration, and Handouts Education comprehension: verbalized understanding and returned demonstration     HOME EXERCISE PROGRAM: Access Code: [email or web address]: [email or web address] Date: 05/15/2022 Prepared by: DOctavio Manns  Exercises - Long Sitting Quad Set  - 4-5 x daily - 7 x weekly - 3 sets - 10 reps - 5  sec hold - Supine Heel Slide  - 4-5 x daily - 7 x weekly - 3 sets - 10 reps - 5 sec hold   ASSESSMENT:   CLINICAL IMPRESSION: Patient presents to PT with increased pain in her Rt knee and reports HEP compliance. Session today focused on R knee ROM and quad strengthening. Patient able to initiate SLR today within a small range and no evidence of quad lag. Patient continues to benefit from skilled PT services and should be progressed as able to improve functional independence.     OBJECTIVE IMPAIRMENTS Abnormal gait, decreased  activity tolerance, decreased balance, decreased endurance, decreased mobility, difficulty walking, decreased ROM, decreased strength, and pain   ACTIVITY LIMITATIONS carrying, lifting, standing, squatting, stairs, transfers, and bed mobility   PARTICIPATION LIMITATIONS: meal prep, cleaning, driving, shopping, community activity, and yard work   PERSONAL FACTORS Past/current experiences, Time since onset of injury/illness/exacerbation, and 3+ comorbidities: DM II, L ACL repair, intellectual disability  are also affecting patient's functional outcome.    REHAB POTENTIAL: Excellent   CLINICAL DECISION MAKING: Stable/uncomplicated   EVALUATION COMPLEXITY: Low     GOALS: Goals reviewed with patient? No   SHORT TERM GOALS: Target date: 06/05/2022  Pt will be compliant and knowledgeable with initial HEP for improved comfort and carryover Baseline: initial HEP given  Goal status: INITIAL   2.  Pt will self report right knee pain no greater than 5/10 for improved comfort and functional ability Baseline: 7/10 at worst Goal status: INITIAL    LONG TERM GOALS: Target date: 08/07/2022    Pt will self report right knee pain no greater than 1-2/10 for improved comfort and functional ability Baseline: 7/10 at worst Goal status: INITIAL   2.  Pt will improve FOTO function score to no less than predicted as proxy for functional improvement Baseline: will assess next session Goal status: INITIAL    3.  Pt will improve R knee AROM to no less than range of 0-120 degrees for improved function mobility Baseline: see chart Goal status: INITIAL   4.  Pt will increase 30 Second Sit to Stand rep count to no less than 10 reps for improved balance, strength, and functional mobility Baseline: 3 reps  Goal status: INITIAL    PLAN: PT FREQUENCY: 2x/week   PT DURATION: 12 weeks   PLANNED INTERVENTIONS: Therapeutic exercises, Therapeutic activity, Neuromuscular re-education, Balance training, Gait  training, Patient/Family education, Self Care, Joint mobilization, Electrical stimulation, Cryotherapy, Moist heat, Vasopneumatic device, Manual therapy, and Re-evaluation   PLAN FOR NEXT SESSION: assess HEP response, quad strengthening, ESTIM R quad, progress per protocol     Margarette Canada, PTA 05/31/2022, 4:50 PM

## 2022-06-04 NOTE — Therapy (Signed)
OUTPATIENT PHYSICAL THERAPY TREATMENT NOTE   Patient Name: Cindy Phillips MRN: [id number] DOB:1990-10-18, 31 y.o., female Today's Date: 06/06/2022  PCP: Lucianne Lei, MD REFERRING PROVIDER: Earlie Server, MD  END OF SESSION:   PT End of Session - 06/05/22 1611     Visit Number 5    Number of Visits 24    Date for PT Re-Evaluation 08/06/22    Authorization Type [employer] Medicare    PT Start Time 5462    PT Stop Time 1653    PT Time Calculation (min) 38 min    Activity Tolerance Patient tolerated treatment well    Behavior During Therapy WFL for tasks assessed/performed               Past Medical History:  Diagnosis Date   Arthritis    Bipolar 1 disorder ([place])    Bowel incontinence    Depression    Diabetes mellitus without complication ([place])    Headache    migraines   Mental disability    Sleep apnea    Past Surgical History:  Procedure Laterality Date   ANTERIOR CRUCIATE LIGAMENT REPAIR Right 05/11/2022   Procedure: RIGHT KNEE ARTHROSCOPY; LATERAL MENISECTOMY; ANTERIOR CRUCIATE LIGAMENT (ACL) REPAIR; CHONDROPLASTY;  Surgeon: Earlie Server, MD;  Location: [facility];  Service: Orthopedics;  Laterality: Right;   FOOT SURGERY     KNEE ARTHROSCOPY W/ ACL RECONSTRUCTION Left 10/12/2020   TOOTH EXTRACTION     Patient Active Problem List   Diagnosis Date Noted   Pain in left elbow 07/27/2020   Prediabetes 02/03/2019   Obstructive sleep apnea 04/23/2018   Other fatigue 11/26/2017   Shortness of breath on exertion 11/26/2017   Type 2 diabetes mellitus without complication, without long-term current use of insulin ([place]) 11/26/2017   Other hyperlipidemia 11/26/2017   Vitamin D deficiency 11/26/2017   Schizoaffective disorder, bipolar type ([place]) 09/14/2014   Intellectual disability 09/10/2014    REFERRING DIAG: POST OP RIGHT KNEE SCOPE ACL  05/11/22  THERAPY DIAG:  Acute pain of right knee  Muscle weakness (generalized)  Other abnormalities of gait and  mobility  Rationale for Evaluation and Treatment Rehabilitation  PERTINENT HISTORY: DM II, L ACL repair, intellectual disability  PRECAUTIONS: Knee  SUBJECTIVE: Pt presents to PT with reports of decreased knee pain compared to last session. She notes she fell last week, felt her R leg slip out from under her when walking in her house. Pain went up to 10/10 but is less now. She is ready to begin PT at this time.   PAIN:  Are you having pain?  Yes: NPRS scale: 5/10 (7/10 at worst) Pain location: R knee Pain description: post surgery Aggravating factors: walking Relieving factors: rest, medication   OBJECTIVE: (objective measures completed at initial evaluation unless otherwise dated)  DIAGNOSTIC FINDINGS:            See imaging    PATIENT SURVEYS:  FOTO: will assess next session   COGNITION:           Overall cognitive status: Within functional limits for tasks assessed                          SENSATION: WFL   POSTURE:  Large body habitus, increased lumbar lordosis, rounded shoulders, fwd head   PALPATION: N/A   LOWER EXTREMITY ROM:   Active ROM Right eval Right 05/29/2022 Right 06/05/2022  Hip flexion      Hip extension  Hip abduction      Hip adduction      Hip internal rotation      Hip external rotation      Knee flexion 65 105 112   Knee extension 10    Ankle dorsiflexion      Ankle plantarflexion      Ankle inversion      Ankle eversion       (Blank rows = not tested)   LOWER EXTREMITY MMT:   MMT Right eval Left eval  Hip flexion DNT 4/5  Hip extension      Hip abduction DNT 4/5  Hip adduction DNT 4/5  Hip internal rotation      Hip external rotation      Knee flexion DNT 4/5  Knee extension DNT 4/5  Ankle dorsiflexion      Ankle plantarflexion      Ankle inversion      Ankle eversion       (Blank rows = not tested)   FUNCTIONAL TESTS:  30 Second Sit to Stand: 3 reps   GAIT: Distance walked: 61f Assistive device utilized:  Crutches Level of assistance: Modified independence Comments: antalgic gait on R, brace on R LE   TODAY'S TREATMENT: [facility] Adult PT Treatment:                                                DATE: 06/05/2022 Therapeutic Exercise: Rec bike no resistance x 5 min for ROM Quad sets 2x10 - 5" hold R heel slide 2x10 - 5" hold Bridge 2x10  SLR 2x10 (small range) - good quad set Prone knee hang 2x30" R Gait rolled into therex - amb 1772fwith one axillary crutch  [facility] Adult PT Treatment:                                                DATE: 05/31/2022 Therapeutic Exercise: Rec bike no resistance x 5 min for ROM R quad set 2x15 - 5" hold R heel slide 2x10 - 5" hold Bridge 2x10  SLR x10 (small range) Prone knee hang 2x30" R  [facility] Adult PT Treatment:                                                DATE: 05/29/2022 Therapeutic Exercise: Rec bike no resistance x 5 min for ROM R quad set 2x15 - 5" hold R heel slide 2x10 - 5" hold Bridge 2x10  LAQ Lt 3x10 Attempted SLR, unable to lift  PATIENT EDUCATION:  Education details: HEP update Person educated: Patient Education method: Explanation, Demonstration, and Handouts Education comprehension: verbalized understanding and returned demonstration     HOME EXERCISE PROGRAM: Access Code: [email or web address]: [email or web address] Date: 05/15/2022 Prepared by: DaOctavio Manns Exercises - Long Sitting Quad Set  - 4-5 x daily - 7 x weekly - 3 sets - 10 reps - 5 sec hold - Supine Heel Slide  - 4-5 x daily - 7 x weekly - 3 sets - 10 reps - 5 sec hold   ASSESSMENT:   CLINICAL IMPRESSION: Pt was able  to complete all prescribed exercises with no adverse effect or increase in pain. She demonstrates improving quad contraction on R LE, increased ability to keep R knee in extension. Pt continues to benefit from skilled PT services and will continue to be seen and progressed as able per POC.     OBJECTIVE IMPAIRMENTS Abnormal gait, decreased  activity tolerance, decreased balance, decreased endurance, decreased mobility, difficulty walking, decreased ROM, decreased strength, and pain   ACTIVITY LIMITATIONS carrying, lifting, standing, squatting, stairs, transfers, and bed mobility   PARTICIPATION LIMITATIONS: meal prep, cleaning, driving, shopping, community activity, and yard work   PERSONAL FACTORS Past/current experiences, Time since onset of injury/illness/exacerbation, and 3+ comorbidities: DM II, L ACL repair, intellectual disability  are also affecting patient's functional outcome.      GOALS: Goals reviewed with patient? No   SHORT TERM GOALS: Target date: 06/05/2022  Pt will be compliant and knowledgeable with initial HEP for improved comfort and carryover Baseline: initial HEP given  Goal status: INITIAL   2.  Pt will self report right knee pain no greater than 5/10 for improved comfort and functional ability Baseline: 7/10 at worst Goal status: INITIAL    LONG TERM GOALS: Target date: 08/07/2022    Pt will self report right knee pain no greater than 1-2/10 for improved comfort and functional ability Baseline: 7/10 at worst Goal status: INITIAL   2.  Pt will improve FOTO function score to no less than predicted as proxy for functional improvement Baseline: will assess next session Goal status: INITIAL    3.  Pt will improve R knee AROM to no less than range of 0-120 degrees for improved function mobility Baseline: see chart Goal status: INITIAL   4.  Pt will increase 30 Second Sit to Stand rep count to no less than 10 reps for improved balance, strength, and functional mobility Baseline: 3 reps  Goal status: INITIAL    PLAN: PT FREQUENCY: 2x/week   PT DURATION: 12 weeks   PLANNED INTERVENTIONS: Therapeutic exercises, Therapeutic activity, Neuromuscular re-education, Balance training, Gait training, Patient/Family education, Self Care, Joint mobilization, Electrical stimulation, Cryotherapy, Moist  heat, Vasopneumatic device, Manual therapy, and Re-evaluation   PLAN FOR NEXT SESSION: assess HEP response, quad strengthening, ESTIM R quad, progress per protocol     Ward Chatters, PT 06/06/2022, 9:00 AM

## 2022-06-05 ENCOUNTER — Ambulatory Visit: Payer: Medicare Other

## 2022-06-05 DIAGNOSIS — M25561 Pain in right knee: Secondary | ICD-10-CM | POA: Diagnosis not present

## 2022-06-05 DIAGNOSIS — R2689 Other abnormalities of gait and mobility: Secondary | ICD-10-CM

## 2022-06-05 DIAGNOSIS — M25562 Pain in left knee: Secondary | ICD-10-CM | POA: Diagnosis not present

## 2022-06-05 DIAGNOSIS — M6281 Muscle weakness (generalized): Secondary | ICD-10-CM | POA: Diagnosis not present

## 2022-06-05 DIAGNOSIS — G8929 Other chronic pain: Secondary | ICD-10-CM | POA: Diagnosis not present

## 2022-06-13 ENCOUNTER — Ambulatory Visit: Payer: Medicare Other

## 2022-06-13 DIAGNOSIS — G8929 Other chronic pain: Secondary | ICD-10-CM

## 2022-06-13 DIAGNOSIS — M6281 Muscle weakness (generalized): Secondary | ICD-10-CM

## 2022-06-13 DIAGNOSIS — M25561 Pain in right knee: Secondary | ICD-10-CM

## 2022-06-13 DIAGNOSIS — R2689 Other abnormalities of gait and mobility: Secondary | ICD-10-CM | POA: Diagnosis not present

## 2022-06-13 DIAGNOSIS — M25562 Pain in left knee: Secondary | ICD-10-CM | POA: Diagnosis not present

## 2022-06-13 NOTE — Therapy (Signed)
OUTPATIENT PHYSICAL THERAPY TREATMENT NOTE   Patient Name: Cindy Phillips MRN: [id number] DOB:[birth year], 31 y.o., female Today's Date: 06/13/2022  PCP: Lucianne Lei, MD REFERRING PROVIDER: Earlie Server, MD  END OF SESSION:   PT End of Session - 06/13/22 1259     Visit Number 6    Number of Visits 24    Date for PT Re-Evaluation 08/06/22    Authorization Type [employer] Medicare    PT Start Time 1300    PT Stop Time 1340    PT Time Calculation (min) 40 min    Activity Tolerance Patient tolerated treatment well    Behavior During Therapy WFL for tasks assessed/performed                Past Medical History:  Diagnosis Date   Arthritis    Bipolar 1 disorder ([place])    Bowel incontinence    Depression    Diabetes mellitus without complication ([place])    Headache    migraines   Mental disability    Sleep apnea    Past Surgical History:  Procedure Laterality Date   ANTERIOR CRUCIATE LIGAMENT REPAIR Right 05/11/2022   Procedure: RIGHT KNEE ARTHROSCOPY; LATERAL MENISECTOMY; ANTERIOR CRUCIATE LIGAMENT (ACL) REPAIR; CHONDROPLASTY;  Surgeon: Earlie Server, MD;  Location: [facility];  Service: Orthopedics;  Laterality: Right;   FOOT SURGERY     KNEE ARTHROSCOPY W/ ACL RECONSTRUCTION Left 10/12/2020   TOOTH EXTRACTION     Patient Active Problem List   Diagnosis Date Noted   Pain in left elbow 07/27/2020   Prediabetes 02/03/2019   Obstructive sleep apnea 04/23/2018   Other fatigue 11/26/2017   Shortness of breath on exertion 11/26/2017   Type 2 diabetes mellitus without complication, without long-term current use of insulin ([place]) 11/26/2017   Other hyperlipidemia 11/26/2017   Vitamin D deficiency 11/26/2017   Schizoaffective disorder, bipolar type ([place]) 09/14/2014   Intellectual disability 09/10/2014    REFERRING DIAG: POST OP RIGHT KNEE SCOPE ACL  05/11/22  THERAPY DIAG:  Acute pain of right knee  Muscle weakness (generalized)  Other abnormalities of gait and  mobility  Chronic pain of left knee  Rationale for Evaluation and Treatment Rehabilitation  PERTINENT HISTORY: DM II, L ACL repair, intellectual disability  PRECAUTIONS: Knee  SUBJECTIVE: Patient reports no new falls since last session, HEP compliance.   PAIN:  Are you having pain?  Yes: NPRS scale: 0/10 (7/10 at worst) Pain location: R knee Pain description: post surgery Aggravating factors: walking Relieving factors: rest, medication   OBJECTIVE: (objective measures completed at initial evaluation unless otherwise dated)  DIAGNOSTIC FINDINGS:            See imaging    PATIENT SURVEYS:  FOTO: will assess next session   COGNITION:           Overall cognitive status: Within functional limits for tasks assessed                          SENSATION: WFL   POSTURE:  Large body habitus, increased lumbar lordosis, rounded shoulders, fwd head   PALPATION: N/A   LOWER EXTREMITY ROM:   Active ROM Right eval Right 05/29/2022 Right 06/05/2022  Hip flexion      Hip extension      Hip abduction      Hip adduction      Hip internal rotation      Hip external rotation      Knee flexion  65 105 112   Knee extension 10    Ankle dorsiflexion      Ankle plantarflexion      Ankle inversion      Ankle eversion       (Blank rows = not tested)   LOWER EXTREMITY MMT:   MMT Right eval Left eval  Hip flexion DNT 4/5  Hip extension      Hip abduction DNT 4/5  Hip adduction DNT 4/5  Hip internal rotation      Hip external rotation      Knee flexion DNT 4/5  Knee extension DNT 4/5  Ankle dorsiflexion      Ankle plantarflexion      Ankle inversion      Ankle eversion       (Blank rows = not tested)   FUNCTIONAL TESTS:  30 Second Sit to Stand: 3 reps   GAIT: Distance walked: 25ft Assistive device utilized: Crutches Level of assistance: Modified independence Comments: antalgic gait on R, brace on R LE   TODAY'S TREATMENT: [facility] Adult PT Treatment:                                                 DATE: 06/13/22 Therapeutic Exercise: Rec bike no resistance x 5 min for ROM Quad sets 2x10 - 5" hold R heel slide 2x10 - 5" hold Bridge 2x10  SLR 2x10 (small range) - good quad set Prone knee hang 2x30" R Gait rolled into therex - amb 175ft with one axillary crutch  [facility] Adult PT Treatment:                                                DATE: 06/05/2022 Therapeutic Exercise: Rec bike no resistance x 5 min for ROM Quad sets 2x10 - 5" hold R heel slide 2x10 - 5" hold Bridge 2x10  SLR 2x10 (small range) - good quad set Prone knee hang 2x30" R Gait rolled into therex - amb 175ft with one axillary crutch  [facility] Adult PT Treatment:                                                DATE: 05/31/2022 Therapeutic Exercise: Rec bike no resistance x 5 min for ROM R quad set 2x15 - 5" hold R heel slide 2x10 - 5" hold Bridge 2x10  SLR x10 (small range) Prone knee hang 2x30" R   PATIENT EDUCATION:  Education details: HEP update Person educated: Patient Education method: Explanation, Demonstration, and Handouts Education comprehension: verbalized understanding and returned demonstration     HOME EXERCISE PROGRAM: Access Code: [email or web address] URL: [email or web address] Date: 05/15/2022 Prepared by: David Stroup   Exercises - Long Sitting Quad Set  - 4-5 x daily - 7 x weekly - 3 sets - 10 reps - 5 sec hold - Supine Heel Slide  - 4-5 x daily - 7 x weekly - 3 sets - 10 reps - 5 sec hold   ASSESSMENT:   CLINICAL IMPRESSION: Patient presents to PT with no current pain in her R knee and reports HEP compliance and no new   falls since last session. Session today focused on quad strengthening, ROM, and gait with one axillary crutch. Patient was able to tolerate all prescribed exercises with no adverse effects. Patient continues to benefit from skilled PT services and should be progressed as able to improve functional independence.     OBJECTIVE  IMPAIRMENTS Abnormal gait, decreased activity tolerance, decreased balance, decreased endurance, decreased mobility, difficulty walking, decreased ROM, decreased strength, and pain   ACTIVITY LIMITATIONS carrying, lifting, standing, squatting, stairs, transfers, and bed mobility   PARTICIPATION LIMITATIONS: meal prep, cleaning, driving, shopping, community activity, and yard work   PERSONAL FACTORS Past/current experiences, Time since onset of injury/illness/exacerbation, and 3+ comorbidities: DM II, L ACL repair, intellectual disability  are also affecting patient's functional outcome.      GOALS: Goals reviewed with patient? No   SHORT TERM GOALS: Target date: 06/05/2022  Pt will be compliant and knowledgeable with initial HEP for improved comfort and carryover Baseline: initial HEP given  Goal status: MET Pt reports adherence 06/13/22   2.  Pt will self report right knee pain no greater than 5/10 for improved comfort and functional ability Baseline: 7/10 at worst Goal status: Ongoing Pt reports 7/10 06/13/22   LONG TERM GOALS: Target date: 08/07/2022    Pt will self report right knee pain no greater than 1-2/10 for improved comfort and functional ability Baseline: 7/10 at worst Goal status: INITIAL   2.  Pt will improve FOTO function score to no less than predicted as proxy for functional improvement Baseline: will assess next session Goal status: INITIAL    3.  Pt will improve R knee AROM to no less than range of 0-120 degrees for improved function mobility Baseline: see chart Goal status: INITIAL   4.  Pt will increase 30 Second Sit to Stand rep count to no less than 10 reps for improved balance, strength, and functional mobility Baseline: 3 reps  Goal status: INITIAL    PLAN: PT FREQUENCY: 2x/week   PT DURATION: 12 weeks   PLANNED INTERVENTIONS: Therapeutic exercises, Therapeutic activity, Neuromuscular re-education, Balance training, Gait training, Patient/Family  education, Self Care, Joint mobilization, Electrical stimulation, Cryotherapy, Moist heat, Vasopneumatic device, Manual therapy, and Re-evaluation   PLAN FOR NEXT SESSION: assess HEP response, quad strengthening, ESTIM R quad, progress per protocol     Margarette Canada, PTA 06/13/2022, 1:50 PM

## 2022-06-14 ENCOUNTER — Ambulatory Visit: Payer: Medicare Other

## 2022-06-18 ENCOUNTER — Ambulatory Visit: Payer: Medicare Other

## 2022-06-18 DIAGNOSIS — R2689 Other abnormalities of gait and mobility: Secondary | ICD-10-CM | POA: Diagnosis not present

## 2022-06-18 DIAGNOSIS — M6281 Muscle weakness (generalized): Secondary | ICD-10-CM

## 2022-06-18 DIAGNOSIS — M25561 Pain in right knee: Secondary | ICD-10-CM

## 2022-06-18 DIAGNOSIS — G8929 Other chronic pain: Secondary | ICD-10-CM | POA: Diagnosis not present

## 2022-06-18 DIAGNOSIS — M25562 Pain in left knee: Secondary | ICD-10-CM | POA: Diagnosis not present

## 2022-06-18 NOTE — Therapy (Signed)
OUTPATIENT PHYSICAL THERAPY TREATMENT NOTE   Patient Name: Cindy Phillips MRN: [id number] DOB:June 29, 1991, 31 y.o., female Today's Date: 06/18/2022  PCP: Lucianne Lei, MD REFERRING PROVIDER: Earlie Server, MD  END OF SESSION:   PT End of Session - 06/18/22 1351     Visit Number 7    Number of Visits 24    Date for PT Re-Evaluation 08/06/22    Authorization Type [employer]HC Medicare    PT Start Time 1400    PT Stop Time 1440    PT Time Calculation (min) 40 min    Activity Tolerance Patient tolerated treatment well    Behavior During Therapy WFL for tasks assessed/performed                 Past Medical History:  Diagnosis Date   Arthritis    Bipolar 1 disorder ([place])    Bowel incontinence    Depression    Diabetes mellitus without complication ([place])    Headache    migraines   Mental disability    Sleep apnea    Past Surgical History:  Procedure Laterality Date   ANTERIOR CRUCIATE LIGAMENT REPAIR Right 05/11/2022   Procedure: RIGHT KNEE ARTHROSCOPY; LATERAL MENISECTOMY; ANTERIOR CRUCIATE LIGAMENT (ACL) REPAIR; CHONDROPLASTY;  Surgeon: Earlie Server, MD;  Location: [facility];  Service: Orthopedics;  Laterality: Right;   FOOT SURGERY     KNEE ARTHROSCOPY W/ ACL RECONSTRUCTION Left 10/12/2020   TOOTH EXTRACTION     Patient Active Problem List   Diagnosis Date Noted   Pain in left elbow 07/27/2020   Prediabetes 02/03/2019   Obstructive sleep apnea 04/23/2018   Other fatigue 11/26/2017   Shortness of breath on exertion 11/26/2017   Type 2 diabetes mellitus without complication, without long-term current use of insulin ([place]) 11/26/2017   Other hyperlipidemia 11/26/2017   Vitamin D deficiency 11/26/2017   Schizoaffective disorder, bipolar type ([place]) 09/14/2014   Intellectual disability 09/10/2014    REFERRING DIAG: POST OP RIGHT KNEE SCOPE ACL  05/11/22  THERAPY DIAG:  Acute pain of right knee  Muscle weakness (generalized)  Other abnormalities of gait and  mobility  Rationale for Evaluation and Treatment Rehabilitation  PERTINENT HISTORY: DM II, L ACL repair, intellectual disability  PRECAUTIONS: Knee  SUBJECTIVE: Pt presents to PT with no current reports of pain or discomfort. Has been compliant with HEP, states she mowed her grass last weekend. Is ready to begin PT at this time.   PAIN:  Are you having pain?  Yes: NPRS scale: 0/10 (7/10 at worst) Pain location: R knee Pain description: post surgery Aggravating factors: walking Relieving factors: rest, medication   OBJECTIVE: (objective measures completed at initial evaluation unless otherwise dated)  DIAGNOSTIC FINDINGS:            See imaging    PATIENT SURVEYS:  FOTO: will assess next session   COGNITION:           Overall cognitive status: Within functional limits for tasks assessed                          SENSATION: WFL   POSTURE:  Large body habitus, increased lumbar lordosis, rounded shoulders, fwd head   PALPATION: N/A   LOWER EXTREMITY ROM:   Active ROM Right eval Right 05/29/2022 Right 06/05/2022  Hip flexion      Hip extension      Hip abduction      Hip adduction      Hip  internal rotation      Hip external rotation      Knee flexion 65 105 112   Knee extension 10    Ankle dorsiflexion      Ankle plantarflexion      Ankle inversion      Ankle eversion       (Blank rows = not tested)   LOWER EXTREMITY MMT:   MMT Right eval Left eval  Hip flexion DNT 4/5  Hip extension      Hip abduction DNT 4/5  Hip adduction DNT 4/5  Hip internal rotation      Hip external rotation      Knee flexion DNT 4/5  Knee extension DNT 4/5  Ankle dorsiflexion      Ankle plantarflexion      Ankle inversion      Ankle eversion       (Blank rows = not tested)   FUNCTIONAL TESTS:  30 Second Sit to Stand: 3 reps   GAIT: Distance walked: 67f Assistive device utilized: Crutches Level of assistance: Modified independence Comments: antalgic gait on R,  brace on R LE   TODAY'S TREATMENT: [facility] Adult PT Treatment:                                                DATE: 06/18/22 Therapeutic Exercise: Rec bike no resistance x 5 min for ROM Quad sets x 10 - 5" hold R heel slide 2x10 - 5" hold Bridge 2x10  SLR 2x10 (small range) - good quad set S/L hip abd 2x10 Standing hip abd/ext 2x10 R Standing mini squat to 45 x 10 Gait rolled into therex - amb 1728fwith one axillary crutch  [facility] Adult PT Treatment:                                                DATE: 06/13/22 Therapeutic Exercise: Rec bike no resistance x 5 min for ROM Quad sets 2x10 - 5" hold R heel slide 2x10 - 5" hold Bridge 2x10  SLR 2x10 (small range) - good quad set Prone knee hang 2x30" R Gait rolled into therex - amb 17532fith one axillary crutch   PATIENT EDUCATION:  Education details: HEP update Person educated: Patient Education method: Explanation, Demonstration, and Handouts Education comprehension: verbalized understanding and returned demonstration     HOME EXERCISE PROGRAM: Access Code: [email or web address]: [email or web address] Date: 05/15/2022 Prepared by: DavOctavio MannsExercises - Long Sitting Quad Set  - 4-5 x daily - 7 x weekly - 3 sets - 10 reps - 5 sec hold - Supine Heel Slide  - 4-5 x daily - 7 x weekly - 3 sets - 10 reps - 5 sec hold   ASSESSMENT:   CLINICAL IMPRESSION: Pt was able to complete all prescribed exercises with no adverse effect or increase in pain. She demonstrates improving quad contraction on R LE, increased ability to keep R knee in extension. Pt continues to benefit from skilled PT services and will continue to be seen and progressed as able per POC.     OBJECTIVE IMPAIRMENTS Abnormal gait, decreased activity tolerance, decreased balance, decreased endurance, decreased mobility, difficulty walking, decreased ROM, decreased strength, and pain   ACTIVITY LIMITATIONS  carrying, lifting, standing, squatting, stairs,  transfers, and bed mobility   PARTICIPATION LIMITATIONS: meal prep, cleaning, driving, shopping, community activity, and yard work   PERSONAL FACTORS Past/current experiences, Time since onset of injury/illness/exacerbation, and 3+ comorbidities: DM II, L ACL repair, intellectual disability  are also affecting patient's functional outcome.      GOALS: Goals reviewed with patient? No   SHORT TERM GOALS: Target date: 06/05/2022  Pt will be compliant and knowledgeable with initial HEP for improved comfort and carryover Baseline: initial HEP given  Goal status: MET Pt reports adherence 06/13/22   2.  Pt will self report right knee pain no greater than 5/10 for improved comfort and functional ability Baseline: 7/10 at worst Goal status: Ongoing Pt reports 7/10 06/13/22   LONG TERM GOALS: Target date: 08/07/2022    Pt will self report right knee pain no greater than 1-2/10 for improved comfort and functional ability Baseline: 7/10 at worst Goal status: INITIAL   2.  Pt will improve FOTO function score to no less than predicted as proxy for functional improvement Baseline: will assess next session Goal status: INITIAL    3.  Pt will improve R knee AROM to no less than range of 0-120 degrees for improved function mobility Baseline: see chart Goal status: INITIAL   4.  Pt will increase 30 Second Sit to Stand rep count to no less than 10 reps for improved balance, strength, and functional mobility Baseline: 3 reps  Goal status: INITIAL    PLAN: PT FREQUENCY: 2x/week   PT DURATION: 12 weeks   PLANNED INTERVENTIONS: Therapeutic exercises, Therapeutic activity, Neuromuscular re-education, Balance training, Gait training, Patient/Family education, Self Care, Joint mobilization, Electrical stimulation, Cryotherapy, Moist heat, Vasopneumatic device, Manual therapy, and Re-evaluation   PLAN FOR NEXT SESSION: assess HEP response, quad strengthening, ESTIM R quad, progress per protocol      Ward Chatters, PT 06/18/2022, 3:42 PM

## 2022-06-20 ENCOUNTER — Ambulatory Visit: Payer: Medicare Other | Attending: Specialist

## 2022-06-20 DIAGNOSIS — M25561 Pain in right knee: Secondary | ICD-10-CM | POA: Diagnosis not present

## 2022-06-20 DIAGNOSIS — M25662 Stiffness of left knee, not elsewhere classified: Secondary | ICD-10-CM | POA: Diagnosis not present

## 2022-06-20 DIAGNOSIS — R2689 Other abnormalities of gait and mobility: Secondary | ICD-10-CM | POA: Diagnosis not present

## 2022-06-20 DIAGNOSIS — R600 Localized edema: Secondary | ICD-10-CM | POA: Insufficient documentation

## 2022-06-20 DIAGNOSIS — M6281 Muscle weakness (generalized): Secondary | ICD-10-CM | POA: Insufficient documentation

## 2022-06-20 NOTE — Therapy (Signed)
OUTPATIENT PHYSICAL THERAPY TREATMENT NOTE   Patient Name: Cindy Phillips MRN: [id number] DOB:[birth year], 31 y.o., female Today's Date: 06/20/2022  PCP: Lucianne Lei, MD REFERRING PROVIDER: Earlie Server, MD  END OF SESSION:   PT End of Session - 06/20/22 1258     Visit Number 8    Number of Visits 24    Date for PT Re-Evaluation 08/06/22    Authorization Type [employer] Medicare    PT Start Time 1300    PT Stop Time 1340    PT Time Calculation (min) 40 min    Activity Tolerance Patient tolerated treatment well    Behavior During Therapy WFL for tasks assessed/performed              Past Medical History:  Diagnosis Date   Arthritis    Bipolar 1 disorder ([place])    Bowel incontinence    Depression    Diabetes mellitus without complication ([place])    Headache    migraines   Mental disability    Sleep apnea    Past Surgical History:  Procedure Laterality Date   ANTERIOR CRUCIATE LIGAMENT REPAIR Right 05/11/2022   Procedure: RIGHT KNEE ARTHROSCOPY; LATERAL MENISECTOMY; ANTERIOR CRUCIATE LIGAMENT (ACL) REPAIR; CHONDROPLASTY;  Surgeon: Earlie Server, MD;  Location: [facility];  Service: Orthopedics;  Laterality: Right;   FOOT SURGERY     KNEE ARTHROSCOPY W/ ACL RECONSTRUCTION Left 10/12/2020   TOOTH EXTRACTION     Patient Active Problem List   Diagnosis Date Noted   Pain in left elbow 07/27/2020   Prediabetes 02/03/2019   Obstructive sleep apnea 04/23/2018   Other fatigue 11/26/2017   Shortness of breath on exertion 11/26/2017   Type 2 diabetes mellitus without complication, without long-term current use of insulin ([place]) 11/26/2017   Other hyperlipidemia 11/26/2017   Vitamin D deficiency 11/26/2017   Schizoaffective disorder, bipolar type ([place]) 09/14/2014   Intellectual disability 09/10/2014    REFERRING DIAG: POST OP RIGHT KNEE SCOPE ACL  05/11/22  THERAPY DIAG:  Acute pain of right knee  Muscle weakness (generalized)  Other abnormalities of gait and  mobility  Localized edema  Stiffness of left knee, not elsewhere classified  Rationale for Evaluation and Treatment Rehabilitation  PERTINENT HISTORY: DM II, L ACL repair, intellectual disability  PRECAUTIONS: Knee  SUBJECTIVE: Patient reports her pain was higher yesterday due to the weather but that it is better today.   PAIN:  Are you having pain?  Yes: NPRS scale: 2/10 (7/10 at worst) Pain location: R knee Pain description: post surgery Aggravating factors: walking Relieving factors: rest, medication   OBJECTIVE: (objective measures completed at initial evaluation unless otherwise dated)  DIAGNOSTIC FINDINGS:            See imaging    PATIENT SURVEYS:  FOTO: will assess next session   COGNITION:           Overall cognitive status: Within functional limits for tasks assessed                          SENSATION: WFL   POSTURE:  Large body habitus, increased lumbar lordosis, rounded shoulders, fwd head   PALPATION: N/A   LOWER EXTREMITY ROM:   Active ROM Right eval Right 05/29/2022 Right 06/05/2022  Hip flexion      Hip extension      Hip abduction      Hip adduction      Hip internal rotation  Hip external rotation      Knee flexion 65 105 112   Knee extension 10    Ankle dorsiflexion      Ankle plantarflexion      Ankle inversion      Ankle eversion       (Blank rows = not tested)   LOWER EXTREMITY MMT:   MMT Right eval Left eval  Hip flexion DNT 4/5  Hip extension      Hip abduction DNT 4/5  Hip adduction DNT 4/5  Hip internal rotation      Hip external rotation      Knee flexion DNT 4/5  Knee extension DNT 4/5  Ankle dorsiflexion      Ankle plantarflexion      Ankle inversion      Ankle eversion       (Blank rows = not tested)   FUNCTIONAL TESTS:  30 Second Sit to Stand: 3 reps   GAIT: Distance walked: 38f Assistive device utilized: Crutches Level of assistance: Modified independence Comments: antalgic gait on R,  brace on R LE   TODAY'S TREATMENT: [facility] Adult PT Treatment:                                                DATE: 06/20/2022 Therapeutic Exercise: Rec bike no resistance x 5 min for ROM Quad sets x 10 - 5" hold R heel slide 2x10 - 5" hold Bridge 2x10  SLR 2x10 (small range) - good quad set S/L hip abd 2x10 Standing hip abd/ext 2x10 R Standing mini squat to 45 x 10 (UE support on FM bar) Standing heel raises 2x10 Gait rolled into therex - amb [id number] one axillary crutch   [facility] Adult PT Treatment:                                                DATE: 06/18/22 Therapeutic Exercise: Rec bike no resistance x 5 min for ROM Quad sets x 10 - 5" hold R heel slide 2x10 - 5" hold Bridge 2x10  SLR 2x10 (small range) - good quad set S/L hip abd 2x10 Standing hip abd/ext 2x10 R Standing mini squat to 45 x 10 Gait rolled into therex - amb [id number] one axillary crutch  [facility] Adult PT Treatment:                                                DATE: 06/13/22 Therapeutic Exercise: Rec bike no resistance x 5 min for ROM Quad sets 2x10 - 5" hold R heel slide 2x10 - 5" hold Bridge 2x10  SLR 2x10 (small range) - good quad set Prone knee hang 2x30" R Gait rolled into therex - amb [id number] one axillary crutch   PATIENT EDUCATION:  Education details: HEP update Person educated: Patient Education method: Explanation, Demonstration, and Handouts Education comprehension: verbalized understanding and returned demonstration     HOME EXERCISE PROGRAM: Access Code: [email or web address]: [email or web address] Date: 05/15/2022 Prepared by: DaviOctavio Mannsxercises - Long Sitting Quad Set  - 4-5 x daily - 7 x weekly -  3 sets - 10 reps - 5 sec hold - Supine Heel Slide  - 4-5 x daily - 7 x weekly - 3 sets - 10 reps - 5 sec hold   ASSESSMENT:   CLINICAL IMPRESSION: Patient presents to PT with mild pain in her R knee and reports HEP compliance. Session today continued to focus on  quadriceps strengthening and R knee ROM. Incorporated more standing exercises today at less than 45 adhering to precautions. Patient was able to tolerate all prescribed exercises with no adverse effects. Patient continues to benefit from skilled PT services and should be progressed as able to improve functional independence.     OBJECTIVE IMPAIRMENTS Abnormal gait, decreased activity tolerance, decreased balance, decreased endurance, decreased mobility, difficulty walking, decreased ROM, decreased strength, and pain   ACTIVITY LIMITATIONS carrying, lifting, standing, squatting, stairs, transfers, and bed mobility   PARTICIPATION LIMITATIONS: meal prep, cleaning, driving, shopping, community activity, and yard work   PERSONAL FACTORS Past/current experiences, Time since onset of injury/illness/exacerbation, and 3+ comorbidities: DM II, L ACL repair, intellectual disability  are also affecting patient's functional outcome.      GOALS: Goals reviewed with patient? No   SHORT TERM GOALS: Target date: 06/05/2022  Pt will be compliant and knowledgeable with initial HEP for improved comfort and carryover Baseline: initial HEP given  Goal status: MET Pt reports adherence 06/13/22   2.  Pt will self report right knee pain no greater than 5/10 for improved comfort and functional ability Baseline: 7/10 at worst Goal status: Ongoing Pt reports 7/10 06/13/22   LONG TERM GOALS: Target date: 08/07/2022    Pt will self report right knee pain no greater than 1-2/10 for improved comfort and functional ability Baseline: 7/10 at worst Goal status: INITIAL   2.  Pt will improve FOTO function score to no less than predicted as proxy for functional improvement Baseline: will assess next session Goal status: INITIAL    3.  Pt will improve R knee AROM to no less than range of 0-120 degrees for improved function mobility Baseline: see chart Goal status: INITIAL   4.  Pt will increase 30 Second Sit to  Stand rep count to no less than 10 reps for improved balance, strength, and functional mobility Baseline: 3 reps  Goal status: INITIAL    PLAN: PT FREQUENCY: 2x/week   PT DURATION: 12 weeks   PLANNED INTERVENTIONS: Therapeutic exercises, Therapeutic activity, Neuromuscular re-education, Balance training, Gait training, Patient/Family education, Self Care, Joint mobilization, Electrical stimulation, Cryotherapy, Moist heat, Vasopneumatic device, Manual therapy, and Re-evaluation   PLAN FOR NEXT SESSION: assess HEP response, quad strengthening, ESTIM R quad, progress per protocol     Margarette Canada, PTA 06/20/2022, 1:40 PM

## 2022-06-21 DIAGNOSIS — S83241D Other tear of medial meniscus, current injury, right knee, subsequent encounter: Secondary | ICD-10-CM | POA: Diagnosis not present

## 2022-06-26 ENCOUNTER — Ambulatory Visit: Payer: Medicare Other

## 2022-06-26 DIAGNOSIS — M25561 Pain in right knee: Secondary | ICD-10-CM

## 2022-06-26 DIAGNOSIS — R600 Localized edema: Secondary | ICD-10-CM | POA: Diagnosis not present

## 2022-06-26 DIAGNOSIS — R2689 Other abnormalities of gait and mobility: Secondary | ICD-10-CM

## 2022-06-26 DIAGNOSIS — M25662 Stiffness of left knee, not elsewhere classified: Secondary | ICD-10-CM

## 2022-06-26 DIAGNOSIS — M6281 Muscle weakness (generalized): Secondary | ICD-10-CM | POA: Diagnosis not present

## 2022-06-26 NOTE — Therapy (Signed)
OUTPATIENT PHYSICAL THERAPY TREATMENT NOTE   Patient Name: Cindy Phillips MRN: [id number] DOB:[birth year], 31 y.o., female Today's Date: 06/26/2022  PCP: Lucianne Lei, MD REFERRING PROVIDER: Earlie Server, MD  END OF SESSION:   PT End of Session - 06/26/22 1255     Visit Number 9    Number of Visits 24    Date for PT Re-Evaluation 08/06/22    Authorization Type [employer] Medicare    PT Start Time 1300    PT Stop Time 1340    PT Time Calculation (min) 40 min    Activity Tolerance Patient tolerated treatment well    Behavior During Therapy WFL for tasks assessed/performed               Past Medical History:  Diagnosis Date   Arthritis    Bipolar 1 disorder ([place])    Bowel incontinence    Depression    Diabetes mellitus without complication ([place])    Headache    migraines   Mental disability    Sleep apnea    Past Surgical History:  Procedure Laterality Date   ANTERIOR CRUCIATE LIGAMENT REPAIR Right 05/11/2022   Procedure: RIGHT KNEE ARTHROSCOPY; LATERAL MENISECTOMY; ANTERIOR CRUCIATE LIGAMENT (ACL) REPAIR; CHONDROPLASTY;  Surgeon: Earlie Server, MD;  Location: [facility];  Service: Orthopedics;  Laterality: Right;   FOOT SURGERY     KNEE ARTHROSCOPY W/ ACL RECONSTRUCTION Left 10/12/2020   TOOTH EXTRACTION     Patient Active Problem List   Diagnosis Date Noted   Pain in left elbow 07/27/2020   Prediabetes 02/03/2019   Obstructive sleep apnea 04/23/2018   Other fatigue 11/26/2017   Shortness of breath on exertion 11/26/2017   Type 2 diabetes mellitus without complication, without long-term current use of insulin ([place]) 11/26/2017   Other hyperlipidemia 11/26/2017   Vitamin D deficiency 11/26/2017   Schizoaffective disorder, bipolar type ([place]) 09/14/2014   Intellectual disability 09/10/2014    REFERRING DIAG: POST OP RIGHT KNEE SCOPE ACL  05/11/22  THERAPY DIAG:  Acute pain of right knee  Muscle weakness (generalized)  Other abnormalities of gait and  mobility  Localized edema  Stiffness of left knee, not elsewhere classified  Rationale for Evaluation and Treatment Rehabilitation  PERTINENT HISTORY: DM II, L ACL repair, intellectual disability  PRECAUTIONS: Knee  SUBJECTIVE: Patient reports pain in Lt knee today.   PAIN:  Are you having pain?  Yes: NPRS scale: 5/10 (7/10 at worst) Pain location: R knee Pain description: post surgery Aggravating factors: walking Relieving factors: rest, medication   OBJECTIVE: (objective measures completed at initial evaluation unless otherwise dated)  DIAGNOSTIC FINDINGS:            See imaging    PATIENT SURVEYS:  FOTO: will assess next session   COGNITION:           Overall cognitive status: Within functional limits for tasks assessed                          SENSATION: WFL   POSTURE:  Large body habitus, increased lumbar lordosis, rounded shoulders, fwd head   PALPATION: N/A   LOWER EXTREMITY ROM:   Active ROM Right eval Right 05/29/2022 Right 06/05/2022  Hip flexion      Hip extension      Hip abduction      Hip adduction      Hip internal rotation      Hip external rotation      Knee  flexion 65 105 112   Knee extension 10    Ankle dorsiflexion      Ankle plantarflexion      Ankle inversion      Ankle eversion       (Blank rows = not tested)   LOWER EXTREMITY MMT:   MMT Right eval Left eval  Hip flexion DNT 4/5  Hip extension      Hip abduction DNT 4/5  Hip adduction DNT 4/5  Hip internal rotation      Hip external rotation      Knee flexion DNT 4/5  Knee extension DNT 4/5  Ankle dorsiflexion      Ankle plantarflexion      Ankle inversion      Ankle eversion       (Blank rows = not tested)   FUNCTIONAL TESTS:  30 Second Sit to Stand: 3 reps   GAIT: Distance walked: 75f Assistive device utilized: Crutches Level of assistance: Modified independence Comments: antalgic gait on R, brace on R LE   TODAY'S TREATMENT: [facility] Adult PT  Treatment:                                                DATE: 06/26/2022 Therapeutic Exercise: Rec bike no resistance x 5 min for ROM R heel slide 2x10 - 5" hold Bridge 2x10 with ball squeeze SLR 2x10 2# - good quad set S/L hip abd 2# 2x10 Standing hip abd/ext 2x10 BIL RTB at ankles Standing mini squat to 55 x 10 (UE support on FM bar) Standing heel raises 2x10 Gait rolled into therex - amb 1711fwith no AD Step ups 4" 2x10 Rt leading - no UE support  [facility] Adult PT Treatment:                                                DATE: 06/20/2022 Therapeutic Exercise: Rec bike no resistance x 5 min for ROM Quad sets x 10 - 5" hold R heel slide 2x10 - 5" hold Bridge 2x10  SLR 2x10 (small range) - good quad set S/L hip abd 2x10 Standing hip abd/ext 2x10 R Standing mini squat to 45 x 10 (UE support on FM bar) Standing heel raises 2x10 Gait rolled into therex - amb [id number] one axillary crutch   [facility] Adult PT Treatment:                                                DATE: 06/18/22 Therapeutic Exercise: Rec bike no resistance x 5 min for ROM Quad sets x 10 - 5" hold R heel slide 2x10 - 5" hold Bridge 2x10  SLR 2x10 (small range) - good quad set S/L hip abd 2x10 Standing hip abd/ext 2x10 R Standing mini squat to 45 x 10 Gait rolled into therex - amb [id number] one axillary crutch   PATIENT EDUCATION:  Education details: HEP update Person educated: Patient Education method: Explanation, Demonstration, and Handouts Education comprehension: verbalized understanding and returned demonstration     HOME EXERCISE PROGRAM: Access Code: [email or web address]: [email or web address] Date: 05/15/2022 Prepared by: DaviShanon Brow  [place]   Exercises - Long Sitting Quad Set  - 4-5 x daily - 7 x weekly - 3 sets - 10 reps - 5 sec hold - Supine Heel Slide  - 4-5 x daily - 7 x weekly - 3 sets - 10 reps - 5 sec hold   ASSESSMENT:   CLINICAL IMPRESSION: Patient presents to PT with increased  pain this session that she attributes to more walking. Since patient is at 6 week mark since her surgery, advised to wean off of crutches which patient is very agreeable to. She reports she already ambulates at home without crutches with no issue. Increased repetitions and resistance with exercises today to good effect. Patient was able to tolerate all prescribed exercises with no adverse effects. Patient continues to benefit from skilled PT services and should be progressed as able to improve functional independence.      OBJECTIVE IMPAIRMENTS Abnormal gait, decreased activity tolerance, decreased balance, decreased endurance, decreased mobility, difficulty walking, decreased ROM, decreased strength, and pain   ACTIVITY LIMITATIONS carrying, lifting, standing, squatting, stairs, transfers, and bed mobility   PARTICIPATION LIMITATIONS: meal prep, cleaning, driving, shopping, community activity, and yard work   PERSONAL FACTORS Past/current experiences, Time since onset of injury/illness/exacerbation, and 3+ comorbidities: DM II, L ACL repair, intellectual disability  are also affecting patient's functional outcome.      GOALS: Goals reviewed with patient? No   SHORT TERM GOALS: Target date: 06/05/2022  Pt will be compliant and knowledgeable with initial HEP for improved comfort and carryover Baseline: initial HEP given  Goal status: MET Pt reports adherence 06/13/22   2.  Pt will self report right knee pain no greater than 5/10 for improved comfort and functional ability Baseline: 7/10 at worst Goal status: Ongoing Pt reports 7/10 06/13/22   LONG TERM GOALS: Target date: 08/07/2022    Pt will self report right knee pain no greater than 1-2/10 for improved comfort and functional ability Baseline: 7/10 at worst Goal status: INITIAL   2.  Pt will improve FOTO function score to no less than predicted as proxy for functional improvement Baseline: will assess next session Goal status:  INITIAL    3.  Pt will improve R knee AROM to no less than range of 0-120 degrees for improved function mobility Baseline: see chart Goal status: INITIAL   4.  Pt will increase 30 Second Sit to Stand rep count to no less than 10 reps for improved balance, strength, and functional mobility Baseline: 3 reps  Goal status: INITIAL    PLAN: PT FREQUENCY: 2x/week   PT DURATION: 12 weeks   PLANNED INTERVENTIONS: Therapeutic exercises, Therapeutic activity, Neuromuscular re-education, Balance training, Gait training, Patient/Family education, Self Care, Joint mobilization, Electrical stimulation, Cryotherapy, Moist heat, Vasopneumatic device, Manual therapy, and Re-evaluation   PLAN FOR NEXT SESSION: assess HEP response, quad strengthening, ESTIM R quad, progress per protocol     Margarette Canada, PTA 06/26/2022, 12:55 PM

## 2022-06-27 NOTE — Therapy (Signed)
OUTPATIENT PHYSICAL THERAPY TREATMENT NOTE/PROGRESS NOTE   Patient Name: Cindy Phillips MRN: [id number] DOB:1991/05/22, 31 y.o., female Today's Date: 06/28/2022  PCP: Lucianne Lei, MD REFERRING PROVIDER: Earlie Server, MD  END OF SESSION:   PT End of Session - 06/28/22 1351     Visit Number 10    Number of Visits 24    Date for PT Re-Evaluation 08/06/22    Authorization Type [employer] Medicare    PT Start Time 1400    PT Stop Time 1440    PT Time Calculation (min) 40 min    Activity Tolerance Patient tolerated treatment well    Behavior During Therapy WFL for tasks assessed/performed                Past Medical History:  Diagnosis Date   Arthritis    Bipolar 1 disorder ([place])    Bowel incontinence    Depression    Diabetes mellitus without complication ([place])    Headache    migraines   Mental disability    Sleep apnea    Past Surgical History:  Procedure Laterality Date   ANTERIOR CRUCIATE LIGAMENT REPAIR Right 05/11/2022   Procedure: RIGHT KNEE ARTHROSCOPY; LATERAL MENISECTOMY; ANTERIOR CRUCIATE LIGAMENT (ACL) REPAIR; CHONDROPLASTY;  Surgeon: Earlie Server, MD;  Location: [facility];  Service: Orthopedics;  Laterality: Right;   FOOT SURGERY     KNEE ARTHROSCOPY W/ ACL RECONSTRUCTION Left 10/12/2020   TOOTH EXTRACTION     Patient Active Problem List   Diagnosis Date Noted   Pain in left elbow 07/27/2020   Prediabetes 02/03/2019   Obstructive sleep apnea 04/23/2018   Other fatigue 11/26/2017   Shortness of breath on exertion 11/26/2017   Type 2 diabetes mellitus without complication, without long-term current use of insulin ([place]) 11/26/2017   Other hyperlipidemia 11/26/2017   Vitamin D deficiency 11/26/2017   Schizoaffective disorder, bipolar type ([place]) 09/14/2014   Intellectual disability 09/10/2014    REFERRING DIAG: POST OP RIGHT KNEE SCOPE ACL  05/11/22  THERAPY DIAG:  Acute pain of right knee  Muscle weakness (generalized)  Other abnormalities of  gait and mobility  Rationale for Evaluation and Treatment Rehabilitation  PERTINENT HISTORY: DM II, L ACL repair, intellectual disability  PRECAUTIONS: Knee  SUBJECTIVE: Pt presents to PT with reports of decreased pain. Has been compliant with HEP with no adverse effect. Is ready to begin PT at this time.   PAIN:  Are you having pain?  Yes: NPRS scale: 5/10 (7/10 at worst) Pain location: R knee Pain description: post surgery Aggravating factors: walking Relieving factors: rest, medication   OBJECTIVE: (objective measures completed at initial evaluation unless otherwise dated)  DIAGNOSTIC FINDINGS:            See imaging    PATIENT SURVEYS:  FOTO: N/A   COGNITION:           Overall cognitive status: Within functional limits for tasks assessed                          SENSATION: WFL   POSTURE:  Large body habitus, increased lumbar lordosis, rounded shoulders, fwd head   PALPATION: N/A   LOWER EXTREMITY ROM:   Active ROM Right eval Right 05/29/2022 Right 06/05/2022 Right 06/28/2022  Hip flexion       Hip extension       Hip abduction       Hip adduction       Hip internal rotation  Hip external rotation       Knee flexion 65 105 112  117  Knee extension 10     Ankle dorsiflexion       Ankle plantarflexion       Ankle inversion       Ankle eversion        (Blank rows = not tested)   LOWER EXTREMITY MMT:   MMT Right eval Left eval  Hip flexion DNT 4/5  Hip extension      Hip abduction DNT 4/5  Hip adduction DNT 4/5  Hip internal rotation      Hip external rotation      Knee flexion DNT 4/5  Knee extension DNT 4/5  Ankle dorsiflexion      Ankle plantarflexion      Ankle inversion      Ankle eversion       (Blank rows = not tested)   FUNCTIONAL TESTS:  30 Second Sit to Stand: 3 reps   GAIT: Distance walked: 62f Assistive device utilized: Crutches Level of assistance: Modified independence Comments: antalgic gait on R, brace on R  LE   TODAY'S TREATMENT: [facility] Adult PT Treatment:                                                DATE: 06/28/2022 Therapeutic Exercise: Rec bike no resistance x 5 min for ROM R heel slide 2x10 - 5" hold Bridge 2x10 with ball squeeze SLR 2x10 2# - good quad set S/L hip abd 2# 2x10 Standing hip abd/ext 2x15 BIL RTB at ankles Standing mini squat to 55 x 10 (UE support on FM bar) Standing heel raises 2x20 Step ups 6" 2x10 Rt leading - no UE support  [facility] Adult PT Treatment:                                                DATE: 06/26/2022 Therapeutic Exercise: Rec bike no resistance x 5 min for ROM R heel slide 2x10 - 5" hold Bridge 2x10 with ball squeeze SLR 2x10 2# - good quad set S/L hip abd 2# 2x10 Standing hip abd/ext 2x10 BIL RTB at ankles Standing mini squat to 55 x 10 (UE support on FM bar) Standing heel raises 2x10 Gait rolled into therex - amb 1729fwith no AD Step ups 4" 2x10 Rt leading - no UE support  [facility] Adult PT Treatment:                                                DATE: 06/20/2022 Therapeutic Exercise: Rec bike no resistance x 5 min for ROM Quad sets x 10 - 5" hold R heel slide 2x10 - 5" hold Bridge 2x10  SLR 2x10 (small range) - good quad set S/L hip abd 2x10 Standing hip abd/ext 2x10 R Standing mini squat to 45 x 10 (UE support on FM bar) Standing heel raises 2x10 Gait rolled into therex - amb 17561fith one axillary crutch   [facility] Adult PT Treatment:  DATE: 06/18/22 Therapeutic Exercise: Rec bike no resistance x 5 min for ROM Quad sets x 10 - 5" hold R heel slide 2x10 - 5" hold Bridge 2x10  SLR 2x10 (small range) - good quad set S/L hip abd 2x10 Standing hip abd/ext 2x10 R Standing mini squat to 45 x 10 Gait rolled into therex - amb 134f with one axillary crutch   PATIENT EDUCATION:  Education details: HEP update Person educated: Patient Education method: Explanation, Demonstration, and  Handouts Education comprehension: verbalized understanding and returned demonstration     HOME EXERCISE PROGRAM: Access Code: [email or web address]: [email or web address] Date: 05/15/2022 Prepared by: DOctavio Manns  Exercises - Long Sitting Quad Set  - 4-5 x daily - 7 x weekly - 3 sets - 10 reps - 5 sec hold - Supine Heel Slide  - 4-5 x daily - 7 x weekly - 3 sets - 10 reps - 5 sec hold   ASSESSMENT:   CLINICAL IMPRESSION: Pt able to complete prescribed exercises with on adverse effect. Continues to progress well with therapy per protocol, showing improved knee ROM and mobility during progress note evaluation. Will continue to progress as able per POC.     OBJECTIVE IMPAIRMENTS Abnormal gait, decreased activity tolerance, decreased balance, decreased endurance, decreased mobility, difficulty walking, decreased ROM, decreased strength, and pain   ACTIVITY LIMITATIONS carrying, lifting, standing, squatting, stairs, transfers, and bed mobility   PARTICIPATION LIMITATIONS: meal prep, cleaning, driving, shopping, community activity, and yard work   PERSONAL FACTORS Past/current experiences, Time since onset of injury/illness/exacerbation, and 3+ comorbidities: DM II, L ACL repair, intellectual disability  are also affecting patient's functional outcome.      GOALS: Goals reviewed with patient? No   SHORT TERM GOALS: Target date: 06/05/2022  Pt will be compliant and knowledgeable with initial HEP for improved comfort and carryover Baseline: initial HEP given  Goal status: MET Pt reports adherence 06/13/22   2.  Pt will self report right knee pain no greater than 5/10 for improved comfort and functional ability Baseline: 7/10 at worst Goal status: Ongoing Pt reports 7/10 06/13/22   LONG TERM GOALS: Target date: 08/07/2022    Pt will self report right knee pain no greater than 1-2/10 for improved comfort and functional ability Baseline: 7/10 at worst Goal status: ONGOING    2.  Pt will improve FOTO function score to no less than predicted as proxy for functional improvement Baseline: will assess next session Goal status: ONGOING    3.  Pt will improve R knee AROM to no less than range of 0-120 degrees for improved function mobility Baseline: see chart Goal status: ONGOING   4.  Pt will increase 30 Second Sit to Stand rep count to no less than 10 reps for improved balance, strength, and functional mobility Baseline: 3 reps  Goal status: ONGOING    PLAN: PT FREQUENCY: 2x/week   PT DURATION: 12 weeks   PLANNED INTERVENTIONS: Therapeutic exercises, Therapeutic activity, Neuromuscular re-education, Balance training, Gait training, Patient/Family education, Self Care, Joint mobilization, Electrical stimulation, Cryotherapy, Moist heat, Vasopneumatic device, Manual therapy, and Re-evaluation   PLAN FOR NEXT SESSION: assess HEP response, quad strengthening, ESTIM R quad, progress per protocol     DWard Chatters PT 06/28/2022, 2:52 PM

## 2022-06-28 ENCOUNTER — Ambulatory Visit: Payer: Medicare Other

## 2022-06-28 DIAGNOSIS — R600 Localized edema: Secondary | ICD-10-CM | POA: Diagnosis not present

## 2022-06-28 DIAGNOSIS — R2689 Other abnormalities of gait and mobility: Secondary | ICD-10-CM

## 2022-06-28 DIAGNOSIS — M6281 Muscle weakness (generalized): Secondary | ICD-10-CM | POA: Diagnosis not present

## 2022-06-28 DIAGNOSIS — M25561 Pain in right knee: Secondary | ICD-10-CM | POA: Diagnosis not present

## 2022-06-28 DIAGNOSIS — M25662 Stiffness of left knee, not elsewhere classified: Secondary | ICD-10-CM | POA: Diagnosis not present

## 2022-07-02 ENCOUNTER — Ambulatory Visit: Payer: Medicare Other

## 2022-07-02 DIAGNOSIS — M25561 Pain in right knee: Secondary | ICD-10-CM | POA: Diagnosis not present

## 2022-07-02 DIAGNOSIS — R2689 Other abnormalities of gait and mobility: Secondary | ICD-10-CM

## 2022-07-02 DIAGNOSIS — R600 Localized edema: Secondary | ICD-10-CM | POA: Diagnosis not present

## 2022-07-02 DIAGNOSIS — M25662 Stiffness of left knee, not elsewhere classified: Secondary | ICD-10-CM | POA: Diagnosis not present

## 2022-07-02 DIAGNOSIS — M6281 Muscle weakness (generalized): Secondary | ICD-10-CM

## 2022-07-02 NOTE — Therapy (Signed)
OUTPATIENT PHYSICAL THERAPY TREATMENT NOTE/PROGRESS NOTE   Patient Name: Cindy Phillips MRN: [id number] DOB:03-30-1991, 31 y.o., female Today's Date: 07/02/2022  PCP: Lucianne Lei, MD REFERRING PROVIDER: Earlie Server, MD  END OF SESSION:   PT End of Session - 07/02/22 1355     Visit Number 11    Number of Visits 24    Date for PT Re-Evaluation 08/06/22    Authorization Type [employer] Medicare    PT Start Time 1400    PT Stop Time 1440    PT Time Calculation (min) 40 min    Activity Tolerance Patient tolerated treatment well    Behavior During Therapy WFL for tasks assessed/performed                 Past Medical History:  Diagnosis Date   Arthritis    Bipolar 1 disorder ([place])    Bowel incontinence    Depression    Diabetes mellitus without complication ([place])    Headache    migraines   Mental disability    Sleep apnea    Past Surgical History:  Procedure Laterality Date   ANTERIOR CRUCIATE LIGAMENT REPAIR Right 05/11/2022   Procedure: RIGHT KNEE ARTHROSCOPY; LATERAL MENISECTOMY; ANTERIOR CRUCIATE LIGAMENT (ACL) REPAIR; CHONDROPLASTY;  Surgeon: Earlie Server, MD;  Location: [facility];  Service: Orthopedics;  Laterality: Right;   FOOT SURGERY     KNEE ARTHROSCOPY W/ ACL RECONSTRUCTION Left 10/12/2020   TOOTH EXTRACTION     Patient Active Problem List   Diagnosis Date Noted   Pain in left elbow 07/27/2020   Prediabetes 02/03/2019   Obstructive sleep apnea 04/23/2018   Other fatigue 11/26/2017   Shortness of breath on exertion 11/26/2017   Type 2 diabetes mellitus without complication, without long-term current use of insulin ([place]) 11/26/2017   Other hyperlipidemia 11/26/2017   Vitamin D deficiency 11/26/2017   Schizoaffective disorder, bipolar type ([place]) 09/14/2014   Intellectual disability 09/10/2014    REFERRING DIAG: POST OP RIGHT KNEE SCOPE ACL  05/11/22  THERAPY DIAG:  Acute pain of right knee  Muscle weakness (generalized)  Other abnormalities  of gait and mobility  Rationale for Evaluation and Treatment Rehabilitation  PERTINENT HISTORY: DM II, L ACL repair, intellectual disability  PRECAUTIONS: Knee  SUBJECTIVE: Pt presents to PT with reports of decreased pain. Has been compliant with HEP with no adverse effect. Is ready to begin PT at this time.   PAIN:  Are you having pain?  Yes: NPRS scale: 2/10 (7/10 at worst) Pain location: R knee Pain description: post surgery Aggravating factors: walking Relieving factors: rest, medication   OBJECTIVE: (objective measures completed at initial evaluation unless otherwise dated)  DIAGNOSTIC FINDINGS:            See imaging    PATIENT SURVEYS:  FOTO: N/A   COGNITION:           Overall cognitive status: Within functional limits for tasks assessed                          SENSATION: WFL   POSTURE:  Large body habitus, increased lumbar lordosis, rounded shoulders, fwd head   PALPATION: N/A   LOWER EXTREMITY ROM:   Active ROM Right eval Right 05/29/2022 Right 06/05/2022 Right 06/28/2022  Hip flexion       Hip extension       Hip abduction       Hip adduction       Hip internal rotation  Hip external rotation       Knee flexion 65 105 112  117  Knee extension 10     Ankle dorsiflexion       Ankle plantarflexion       Ankle inversion       Ankle eversion        (Blank rows = not tested)   LOWER EXTREMITY MMT:   MMT Right eval Left eval  Hip flexion DNT 4/5  Hip extension      Hip abduction DNT 4/5  Hip adduction DNT 4/5  Hip internal rotation      Hip external rotation      Knee flexion DNT 4/5  Knee extension DNT 4/5  Ankle dorsiflexion      Ankle plantarflexion      Ankle inversion      Ankle eversion       (Blank rows = not tested)   FUNCTIONAL TESTS:  30 Second Sit to Stand: 3 reps   GAIT: Distance walked: 11f Assistive device utilized: Crutches Level of assistance: Modified independence Comments: antalgic gait on R, brace on  R LE   TODAY'S TREATMENT: [facility] Adult PT Treatment:                                                DATE: 07/02/2022 Therapeutic Exercise: Rec bike no resistance x 5 min for ROM Bridge 2x10 with clamshell GTB SLR 2x10 - good quad set S/L clamshell GTB x 15 each STS x 5 Standing mini squat to 80 x 10 (UE support on FM bar) Standing heel raises 2x20 Step ups 8" x 10 each - no UE support  [facility] Adult PT Treatment:                                                DATE: 06/28/2022 Therapeutic Exercise: Rec bike no resistance x 5 min for ROM R heel slide 2x10 - 5" hold Bridge 2x10 with ball squeeze SLR 2x10 2# - good quad set S/L hip abd 2# 2x10 Standing hip abd/ext 2x15 BIL RTB at ankles Standing mini squat to 55 x 10 (UE support on FM bar) Standing heel raises 2x20 Step ups 6" 2x10 Rt leading - no UE support  [facility] Adult PT Treatment:                                                DATE: 06/26/2022 Therapeutic Exercise: Rec bike no resistance x 5 min for ROM R heel slide 2x10 - 5" hold Bridge 2x10 with ball squeeze SLR 2x10 2# - good quad set S/L hip abd 2# 2x10 Standing hip abd/ext 2x10 BIL RTB at ankles Standing mini squat to 55 x 10 (UE support on FM bar) Standing heel raises 2x10 Gait rolled into therex - amb 179fwith no AD Step ups 4" 2x10 Rt leading - no UE support  [facility] Adult PT Treatment:  DATE: 06/20/2022 Therapeutic Exercise: Rec bike no resistance x 5 min for ROM Quad sets x 10 - 5" hold R heel slide 2x10 - 5" hold Bridge 2x10  SLR 2x10 (small range) - good quad set S/L hip abd 2x10 Standing hip abd/ext 2x10 R Standing mini squat to 45 x 10 (UE support on FM bar) Standing heel raises 2x10 Gait rolled into therex - amb 157f with one axillary crutch   [facility] Adult PT Treatment:                                                DATE: 06/18/22 Therapeutic Exercise: Rec bike no resistance x 5 min for ROM Quad sets x 10 -  5" hold R heel slide 2x10 - 5" hold Bridge 2x10  SLR 2x10 (small range) - good quad set S/L hip abd 2x10 Standing hip abd/ext 2x10 R Standing mini squat to 45 x 10 Gait rolled into therex - amb 1759fwith one axillary crutch   PATIENT EDUCATION:  Education details: HEP update Person educated: Patient Education method: Explanation, Demonstration, and Handouts Education comprehension: verbalized understanding and returned demonstration     HOME EXERCISE PROGRAM: Access Code: [email or web address]: [email or web address] Date: 05/15/2022 Prepared by: DaOctavio Manns Exercises - Long Sitting Quad Set  - 4-5 x daily - 7 x weekly - 3 sets - 10 reps - 5 sec hold - Supine Heel Slide  - 4-5 x daily - 7 x weekly - 3 sets - 10 reps - 5 sec hold   ASSESSMENT:   CLINICAL IMPRESSION: Pt was again able to complete all prescribed exercises with no adverse effect or increase in pain. Therapy focused on improving LE strength with emphasis on quad and proximal hip. Pt continues to benefit from skilled PT services, will continue to progress as able per POC.     OBJECTIVE IMPAIRMENTS Abnormal gait, decreased activity tolerance, decreased balance, decreased endurance, decreased mobility, difficulty walking, decreased ROM, decreased strength, and pain   ACTIVITY LIMITATIONS carrying, lifting, standing, squatting, stairs, transfers, and bed mobility   PARTICIPATION LIMITATIONS: meal prep, cleaning, driving, shopping, community activity, and yard work   PERSONAL FACTORS Past/current experiences, Time since onset of injury/illness/exacerbation, and 3+ comorbidities: DM II, L ACL repair, intellectual disability  are also affecting patient's functional outcome.      GOALS: Goals reviewed with patient? No   SHORT TERM GOALS: Target date: 06/05/2022  Pt will be compliant and knowledgeable with initial HEP for improved comfort and carryover Baseline: initial HEP given  Goal status: MET Pt  reports adherence 06/13/22   2.  Pt will self report right knee pain no greater than 5/10 for improved comfort and functional ability Baseline: 7/10 at worst Goal status: Ongoing Pt reports 7/10 06/13/22   LONG TERM GOALS: Target date: 08/07/2022    Pt will self report right knee pain no greater than 1-2/10 for improved comfort and functional ability Baseline: 7/10 at worst Goal status: ONGOING   2.  Pt will improve FOTO function score to no less than predicted as proxy for functional improvement Baseline: will assess next session Goal status: ONGOING    3.  Pt will improve R knee AROM to no less than range of 0-120 degrees for improved function mobility Baseline: see chart Goal status: ONGOING   4.  Pt will increase 30 Second Sit  to Stand rep count to no less than 10 reps for improved balance, strength, and functional mobility Baseline: 3 reps  Goal status: ONGOING    PLAN: PT FREQUENCY: 2x/week   PT DURATION: 12 weeks   PLANNED INTERVENTIONS: Therapeutic exercises, Therapeutic activity, Neuromuscular re-education, Balance training, Gait training, Patient/Family education, Self Care, Joint mobilization, Electrical stimulation, Cryotherapy, Moist heat, Vasopneumatic device, Manual therapy, and Re-evaluation   PLAN FOR NEXT SESSION: assess HEP response, quad strengthening, ESTIM R quad, progress per protocol     Ward Chatters, PT 07/02/2022, 2:55 PM

## 2022-07-03 NOTE — Therapy (Signed)
OUTPATIENT PHYSICAL THERAPY TREATMENT NOTE/PROGRESS NOTE   Patient Name: Cindy Phillips MRN: [id number] DOB:Mar 19, 1991, 31 y.o., female Today's Date: 07/04/2022  PCP: Lucianne Lei, MD REFERRING PROVIDER: Earlie Server, MD  END OF SESSION:   PT End of Session - 07/04/22 1352     Visit Number 12    Number of Visits 24    Date for PT Re-Evaluation 08/06/22    Authorization Type [employer] Medicare    PT Start Time 1400    PT Stop Time 1440    PT Time Calculation (min) 40 min    Activity Tolerance Patient tolerated treatment well    Behavior During Therapy WFL for tasks assessed/performed                  Past Medical History:  Diagnosis Date   Arthritis    Bipolar 1 disorder ([place])    Bowel incontinence    Depression    Diabetes mellitus without complication ([place])    Headache    migraines   Mental disability    Sleep apnea    Past Surgical History:  Procedure Laterality Date   ANTERIOR CRUCIATE LIGAMENT REPAIR Right 05/11/2022   Procedure: RIGHT KNEE ARTHROSCOPY; LATERAL MENISECTOMY; ANTERIOR CRUCIATE LIGAMENT (ACL) REPAIR; CHONDROPLASTY;  Surgeon: Earlie Server, MD;  Location: [facility];  Service: Orthopedics;  Laterality: Right;   FOOT SURGERY     KNEE ARTHROSCOPY W/ ACL RECONSTRUCTION Left 10/12/2020   TOOTH EXTRACTION     Patient Active Problem List   Diagnosis Date Noted   Pain in left elbow 07/27/2020   Prediabetes 02/03/2019   Obstructive sleep apnea 04/23/2018   Other fatigue 11/26/2017   Shortness of breath on exertion 11/26/2017   Type 2 diabetes mellitus without complication, without long-term current use of insulin ([place]) 11/26/2017   Other hyperlipidemia 11/26/2017   Vitamin D deficiency 11/26/2017   Schizoaffective disorder, bipolar type ([place]) 09/14/2014   Intellectual disability 09/10/2014    REFERRING DIAG: POST OP RIGHT KNEE SCOPE ACL  05/11/22  THERAPY DIAG:  Acute pain of right knee  Muscle weakness (generalized)  Rationale for  Evaluation and Treatment Rehabilitation  PERTINENT HISTORY: DM II, L ACL repair, intellectual disability  PRECAUTIONS: Knee  SUBJECTIVE: Pt presents to PT with no current reports of pain in knee. Has been compliant with HEP. Pt is ready to begin PT at this time.   PAIN:  Are you having pain?  Yes: NPRS scale: 2/10 (7/10 at worst) Pain location: R knee Pain description: post surgery Aggravating factors: walking Relieving factors: rest, medication   OBJECTIVE: (objective measures completed at initial evaluation unless otherwise dated)  DIAGNOSTIC FINDINGS:            See imaging    PATIENT SURVEYS:  FOTO: N/A   COGNITION:           Overall cognitive status: Within functional limits for tasks assessed                          SENSATION: WFL   POSTURE:  Large body habitus, increased lumbar lordosis, rounded shoulders, fwd head   PALPATION: N/A   LOWER EXTREMITY ROM:   Active ROM Right eval Right 05/29/2022 Right 06/05/2022 Right 06/28/2022  Hip flexion       Hip extension       Hip abduction       Hip adduction       Hip internal rotation  Hip external rotation       Knee flexion 65 105 112  117  Knee extension 10     Ankle dorsiflexion       Ankle plantarflexion       Ankle inversion       Ankle eversion        (Blank rows = not tested)   LOWER EXTREMITY MMT:   MMT Right eval Left eval  Hip flexion DNT 4/5  Hip extension      Hip abduction DNT 4/5  Hip adduction DNT 4/5  Hip internal rotation      Hip external rotation      Knee flexion DNT 4/5  Knee extension DNT 4/5  Ankle dorsiflexion      Ankle plantarflexion      Ankle inversion      Ankle eversion       (Blank rows = not tested)   FUNCTIONAL TESTS:  30 Second Sit to Stand: 3 reps   GAIT: Distance walked: 60f Assistive device utilized: Crutches Level of assistance: Modified independence Comments: antalgic gait on R, brace on R LE   TODAY'S TREATMENT: [facility] Adult PT  Treatment:                                                DATE: 07/04/2022 Therapeutic Exercise: Rec bike no resistance x 5 min for ROM Bridge 2x10 with clamshell GTB SLR 2x15 - good quad set S/L hip abd 2x10  STS x 10 Standing mini squat to 80 x 10 (UE support on FM bar) Standing heel raises 2x20 Lateral walk GTB x 4 laps in // Standing hip abd/ext 2x10 GTB Step ups 8" x 10 each - no UE support  [facility] Adult PT Treatment:                                                DATE: 07/02/2022 Therapeutic Exercise: Rec bike no resistance x 5 min for ROM Bridge 2x10 with clamshell GTB SLR 2x10 - good quad set S/L clamshell GTB x 15 each STS x 5 Standing mini squat to 80 x 10 (UE support on FM bar) Standing heel raises 2x20 Step ups 8" x 10 each - no UE support  [facility] Adult PT Treatment:                                                DATE: 06/28/2022 Therapeutic Exercise: Rec bike no resistance x 5 min for ROM R heel slide 2x10 - 5" hold Bridge 2x10 with ball squeeze SLR 2x10 2# - good quad set S/L hip abd 2# 2x10 Standing hip abd/ext 2x15 BIL RTB at ankles Standing mini squat to 55 x 10 (UE support on FM bar) Standing heel raises 2x20 Step ups 6" 2x10 Rt leading - no UE support  [facility] Adult PT Treatment:  DATE: 06/26/2022 Therapeutic Exercise: Rec bike no resistance x 5 min for ROM R heel slide 2x10 - 5" hold Bridge 2x10 with ball squeeze SLR 2x10 2# - good quad set S/L hip abd 2# 2x10 Standing hip abd/ext 2x10 BIL RTB at ankles Standing mini squat to 55 x 10 (UE support on FM bar) Standing heel raises 2x10 Gait rolled into therex - amb 158f with no AD Step ups 4" 2x10 Rt leading - no UE support  [facility] Adult PT Treatment:                                                DATE: 06/20/2022 Therapeutic Exercise: Rec bike no resistance x 5 min for ROM Quad sets x 10 - 5" hold R heel slide 2x10 - 5" hold Bridge 2x10  SLR 2x10 (small  range) - good quad set S/L hip abd 2x10 Standing hip abd/ext 2x10 R Standing mini squat to 45 x 10 (UE support on FM bar) Standing heel raises 2x10 Gait rolled into therex - amb 1767fwith one axillary crutch   [facility] Adult PT Treatment:                                                DATE: 06/18/22 Therapeutic Exercise: Rec bike no resistance x 5 min for ROM Quad sets x 10 - 5" hold R heel slide 2x10 - 5" hold Bridge 2x10  SLR 2x10 (small range) - good quad set S/L hip abd 2x10 Standing hip abd/ext 2x10 R Standing mini squat to 45 x 10 Gait rolled into therex - amb 1752fith one axillary crutch   PATIENT EDUCATION:  Education details: HEP update Person educated: Patient Education method: Explanation, Demonstration, and Handouts Education comprehension: verbalized understanding and returned demonstration     HOME EXERCISE PROGRAM: Access Code: [email or web address]: [email or web address] Date: 05/15/2022 Prepared by: DavOctavio MannsExercises - Long Sitting Quad Set  - 4-5 x daily - 7 x weekly - 3 sets - 10 reps - 5 sec hold - Supine Heel Slide  - 4-5 x daily - 7 x weekly - 3 sets - 10 reps - 5 sec hold   ASSESSMENT:   CLINICAL IMPRESSION: Pt was again able to complete all prescribed exercises with no adverse effect or increase in pain. Therapy focused on improving LE strength with emphasis on quad and proximal hip. Pt continues to benefit from skilled PT services, will continue to progress as able per POC.      OBJECTIVE IMPAIRMENTS Abnormal gait, decreased activity tolerance, decreased balance, decreased endurance, decreased mobility, difficulty walking, decreased ROM, decreased strength, and pain   ACTIVITY LIMITATIONS carrying, lifting, standing, squatting, stairs, transfers, and bed mobility   PARTICIPATION LIMITATIONS: meal prep, cleaning, driving, shopping, community activity, and yard work   PERSONAL FACTORS Past/current experiences, Time since onset of  injury/illness/exacerbation, and 3+ comorbidities: DM II, L ACL repair, intellectual disability  are also affecting patient's functional outcome.      GOALS: Goals reviewed with patient? No   SHORT TERM GOALS: Target date: 06/05/2022  Pt will be compliant and knowledgeable with initial HEP for improved comfort and carryover Baseline: initial HEP given  Goal status: MET Pt reports adherence 06/13/22  2.  Pt will self report right knee pain no greater than 5/10 for improved comfort and functional ability Baseline: 7/10 at worst Goal status: Ongoing Pt reports 7/10 06/13/22   LONG TERM GOALS: Target date: 08/07/2022    Pt will self report right knee pain no greater than 1-2/10 for improved comfort and functional ability Baseline: 7/10 at worst Goal status: ONGOING   2.  Pt will improve FOTO function score to no less than predicted as proxy for functional improvement Baseline: will assess next session Goal status: ONGOING    3.  Pt will improve R knee AROM to no less than range of 0-120 degrees for improved function mobility Baseline: see chart Goal status: ONGOING   4.  Pt will increase 30 Second Sit to Stand rep count to no less than 10 reps for improved balance, strength, and functional mobility Baseline: 3 reps  Goal status: ONGOING    PLAN: PT FREQUENCY: 2x/week   PT DURATION: 12 weeks   PLANNED INTERVENTIONS: Therapeutic exercises, Therapeutic activity, Neuromuscular re-education, Balance training, Gait training, Patient/Family education, Self Care, Joint mobilization, Electrical stimulation, Cryotherapy, Moist heat, Vasopneumatic device, Manual therapy, and Re-evaluation   PLAN FOR NEXT SESSION: assess HEP response, quad strengthening, ESTIM R quad, progress per protocol     Ward Chatters, PT 07/04/2022, 2:48 PM

## 2022-07-04 ENCOUNTER — Ambulatory Visit: Payer: Medicare Other

## 2022-07-04 DIAGNOSIS — M6281 Muscle weakness (generalized): Secondary | ICD-10-CM | POA: Diagnosis not present

## 2022-07-04 DIAGNOSIS — R2689 Other abnormalities of gait and mobility: Secondary | ICD-10-CM | POA: Diagnosis not present

## 2022-07-04 DIAGNOSIS — M25561 Pain in right knee: Secondary | ICD-10-CM | POA: Diagnosis not present

## 2022-07-04 DIAGNOSIS — R600 Localized edema: Secondary | ICD-10-CM | POA: Diagnosis not present

## 2022-07-04 DIAGNOSIS — M25662 Stiffness of left knee, not elsewhere classified: Secondary | ICD-10-CM | POA: Diagnosis not present

## 2022-07-09 ENCOUNTER — Encounter: Payer: Self-pay | Admitting: Orthopedic Surgery

## 2022-07-09 ENCOUNTER — Ambulatory Visit (INDEPENDENT_AMBULATORY_CARE_PROVIDER_SITE_OTHER): Payer: Medicare Other | Admitting: Orthopedic Surgery

## 2022-07-09 ENCOUNTER — Ambulatory Visit (INDEPENDENT_AMBULATORY_CARE_PROVIDER_SITE_OTHER): Payer: Medicare Other

## 2022-07-09 VITALS — BP 133/89 | HR 105 | Ht 72.0 in | Wt 350.0 lb

## 2022-07-09 DIAGNOSIS — M5137 Other intervertebral disc degeneration, lumbosacral region: Secondary | ICD-10-CM | POA: Diagnosis not present

## 2022-07-09 NOTE — Progress Notes (Signed)
Orthopedic Spine Surgery Office Note  Assessment: Patient is a 31 y.o. female with low back pain. No radicular symptoms at today's visit.    Plan: -Explained that initially conservative treatment is tried as a significant number of patients may experience relief with these treatment modalities. Discussed that the conservative treatments include:  -activity modification  -physical therapy  -over the counter pain medications  -pain management  -core strengthening -Patient has tried injection and PT  -Recommended core strengthening, weight loss, and NSAIDs/tylenol. She said the doctor that did her ACL reconstruction said she should not take NSAIDs/tylenol so I told her to use those for pain relief whenever cleared to do so -Explained that elective spine surgery would only be considered if she gets her BMI down to 35. Discussed that the risk of complication due to her weight. She currently is BMI of 47. Informed her that for her long-term health and pain, losing weight would be helpful. She is working on her weight and told me she was going to continue to work on it -Patient should return to office on an as needed basis   Patient expressed understanding of the plan and all questions were answered to the patient's satisfaction.   ___________________________________________________________________________   History:  Patient is a 31 y.o. female who presents today for lumbar spine. Has had chronic, stable low back pain for over a year now. There was no trauma or injury that brought on the pain. Pain is worse with activity and improves with rest. Injections have not really helped. PT has helped somewhat. Is not regularly working on core strengthening. Has been trying to lose weight. No pain radiating down the legs. No paresthesias or numbness.    Weakness: denies Symptoms of imbalance: denies Paresthesias and numbness: denies Bowel or bladder incontinence: denies Saddle anesthesia:  denies  Treatments tried: PT, steroid injection  Review of systems: Denies fevers and chills, night sweats, unexplained weight loss, history of cancer, pain that wakes them at night  Past medical history: Bipolar HTN Anxiety DM Migraines Sleep apnea  Allergies: NKDA  Past surgical history:  Bilateral ACL reconstruction  Foot surgery Tooth extraction  Social history: Denies use of nicotine product (smoking, vaping, patches, smokeless) Alcohol use: denies Denies recreational drug use  Physical Exam:  General: no acute distress, appears stated age Neurologic: alert, answering questions appropriately, following commands Respiratory: unlabored breathing on room air, symmetric chest rise Psychiatric: appropriate affect, normal cadence to speech   MSK (spine):  -Strength exam      Left  Right EHL    5/5  5/5 TA    5/5  5/5 GSC    5/5  5/5 Knee extension  5/5  5/5 Hip flexion   5/5  5/5  -Sensory exam    Sensation intact to light touch in L3-S1 nerve distributions of bilateral lower extremities  -Achilles DTR: 2/4 on the left, 2/4 on the right -Patellar tendon DTR: 2/4 on the left, 2/4 on the right  -Straight leg raise: negative -Contralateral straight leg raise: negative -Clonus: no beats bilaterally  -Left hip exam: no pain through range of motion, negative stinchfield -Right hip exam: no pain through range of motion, negative stinchfield   Imaging: XR of the lumbar spine from 07/09/2022 was independently reviewed and interpreted, showing no fracture or dislocation. No evidence of instability on flexion/extension. Disc height loss at L5/S1.   MRI of the lumbar spine from 09/10/2021 was independently reviewed and interpreted, showing L5/S1 central disc herniation with larger herniation  on the right side. DDD at L5/S1. No significant foraminal or central stenosis.    Patient name: Cindy Phillips Patient MRN: [id number] Date of visit: 07/09/22

## 2022-07-16 ENCOUNTER — Ambulatory Visit: Payer: Medicare Other

## 2022-07-16 DIAGNOSIS — M25662 Stiffness of left knee, not elsewhere classified: Secondary | ICD-10-CM | POA: Diagnosis not present

## 2022-07-16 DIAGNOSIS — R2689 Other abnormalities of gait and mobility: Secondary | ICD-10-CM

## 2022-07-16 DIAGNOSIS — R600 Localized edema: Secondary | ICD-10-CM

## 2022-07-16 DIAGNOSIS — M6281 Muscle weakness (generalized): Secondary | ICD-10-CM

## 2022-07-16 DIAGNOSIS — M25561 Pain in right knee: Secondary | ICD-10-CM

## 2022-07-16 NOTE — Therapy (Signed)
OUTPATIENT PHYSICAL THERAPY TREATMENT NOTE   Patient Name: Cindy Phillips MRN: [id number] DOB:15-Apr-1991, 31 y.o., female Today's Date: 07/16/2022  PCP: Lucianne Lei, MD REFERRING PROVIDER: Earlie Server, MD  END OF SESSION:   PT End of Session - 07/16/22 1441     Visit Number 13    Number of Visits 24    Date for PT Re-Evaluation 08/06/22    Authorization Type [employer] Medicare    PT Start Time 5102    PT Stop Time 1525    PT Time Calculation (min) 40 min    Activity Tolerance Patient tolerated treatment well    Behavior During Therapy WFL for tasks assessed/performed                   Past Medical History:  Diagnosis Date   Arthritis    Bipolar 1 disorder ([place])    Bowel incontinence    Depression    Diabetes mellitus without complication ([place])    Headache    migraines   Mental disability    Sleep apnea    Past Surgical History:  Procedure Laterality Date   ANTERIOR CRUCIATE LIGAMENT REPAIR Right 05/11/2022   Procedure: RIGHT KNEE ARTHROSCOPY; LATERAL MENISECTOMY; ANTERIOR CRUCIATE LIGAMENT (ACL) REPAIR; CHONDROPLASTY;  Surgeon: Earlie Server, MD;  Location: [facility];  Service: Orthopedics;  Laterality: Right;   FOOT SURGERY     KNEE ARTHROSCOPY W/ ACL RECONSTRUCTION Left 10/12/2020   TOOTH EXTRACTION     Patient Active Problem List   Diagnosis Date Noted   Pain in left elbow 07/27/2020   Prediabetes 02/03/2019   Obstructive sleep apnea 04/23/2018   Other fatigue 11/26/2017   Shortness of breath on exertion 11/26/2017   Type 2 diabetes mellitus without complication, without long-term current use of insulin ([place]) 11/26/2017   Other hyperlipidemia 11/26/2017   Vitamin D deficiency 11/26/2017   Schizoaffective disorder, bipolar type ([place]) 09/14/2014   Intellectual disability 09/10/2014    REFERRING DIAG: POST OP RIGHT KNEE SCOPE ACL  05/11/22  THERAPY DIAG:  Acute pain of right knee  Muscle weakness (generalized)  Other abnormalities of gait  and mobility  Localized edema  Rationale for Evaluation and Treatment Rehabilitation  PERTINENT HISTORY: DM II, L ACL repair, intellectual disability  PRECAUTIONS: Knee  SUBJECTIVE: Pt presents to PT with no current reports of pain. Has been compliant with HEP with no adverse effect. She is ready to begin PT at this time.   PAIN:  Are you having pain?  No: NPRS scale: 0/10 (7/10 at worst) Pain location: R knee Pain description: post surgery Aggravating factors: walking Relieving factors: rest, medication   OBJECTIVE: (objective measures completed at initial evaluation unless otherwise dated)  DIAGNOSTIC FINDINGS:            See imaging    PATIENT SURVEYS:  FOTO: N/A   COGNITION:           Overall cognitive status: Within functional limits for tasks assessed                          SENSATION: WFL   POSTURE:  Large body habitus, increased lumbar lordosis, rounded shoulders, fwd head   PALPATION: N/A   LOWER EXTREMITY ROM:   Active ROM Right eval Right 05/29/2022 Right 06/05/2022 Right 06/28/2022  Hip flexion       Hip extension       Hip abduction       Hip adduction  Hip internal rotation       Hip external rotation       Knee flexion 65 105 112  117  Knee extension 10     Ankle dorsiflexion       Ankle plantarflexion       Ankle inversion       Ankle eversion        (Blank rows = not tested)   LOWER EXTREMITY MMT:   MMT Right eval Left eval  Hip flexion DNT 4/5  Hip extension      Hip abduction DNT 4/5  Hip adduction DNT 4/5  Hip internal rotation      Hip external rotation      Knee flexion DNT 4/5  Knee extension DNT 4/5  Ankle dorsiflexion      Ankle plantarflexion      Ankle inversion      Ankle eversion       (Blank rows = not tested)   FUNCTIONAL TESTS:  30 Second Sit to Stand: 3 reps   GAIT: Distance walked: 61f Assistive device utilized: Crutches Level of assistance: Modified independence Comments: antalgic gait  on R, brace on R LE   TODAY'S TREATMENT: [facility] Adult PT Treatment:                                                DATE: 07/16/2022 Therapeutic Exercise: Rec bike lvl 3.0 x 5 min for ROM STS 2x10  S/L hip abd x 15 SLR x 15 - good quad set Seated knee ext 2x10 35# Standing mini squat to 90 x 10 (UE support on FM bar) Forward lunge x 10 each Step ups 8" x 10 each - no UE support  [facility] Adult PT Treatment:                                                DATE: 07/04/2022 Therapeutic Exercise: Rec bike no resistance x 5 min for ROM Bridge 2x10 with clamshell GTB SLR 2x15 - good quad set S/L hip abd 2x10  STS x 10 Standing mini squat to 80 x 10 (UE support on FM bar) Standing heel raises 2x20 Lateral walk GTB x 4 laps in // Standing hip abd/ext 2x10 GTB Step ups 8" x 10 each - no UE support  [facility] Adult PT Treatment:                                                DATE: 07/02/2022 Therapeutic Exercise: Rec bike no resistance x 5 min for ROM Bridge 2x10 with clamshell GTB SLR 2x10 - good quad set S/L clamshell GTB x 15 each STS x 5 Standing mini squat to 80 x 10 (UE support on FM bar) Standing heel raises 2x20 Step ups 8" x 10 each - no UE support  [facility] Adult PT Treatment:  DATE: 06/28/2022 Therapeutic Exercise: Rec bike no resistance x 5 min for ROM R heel slide 2x10 - 5" hold Bridge 2x10 with ball squeeze SLR 2x10 2# - good quad set S/L hip abd 2# 2x10 Standing hip abd/ext 2x15 BIL RTB at ankles Standing mini squat to 55 x 10 (UE support on FM bar) Standing heel raises 2x20 Step ups 6" 2x10 Rt leading - no UE support  [facility] Adult PT Treatment:                                                DATE: 06/26/2022 Therapeutic Exercise: Rec bike no resistance x 5 min for ROM R heel slide 2x10 - 5" hold Bridge 2x10 with ball squeeze SLR 2x10 2# - good quad set S/L hip abd 2# 2x10 Standing hip abd/ext 2x10 BIL RTB at ankles Standing  mini squat to 55 x 10 (UE support on FM bar) Standing heel raises 2x10 Gait rolled into therex - amb 146f with no AD Step ups 4" 2x10 Rt leading - no UE support  [facility] Adult PT Treatment:                                                DATE: 06/20/2022 Therapeutic Exercise: Rec bike no resistance x 5 min for ROM Quad sets x 10 - 5" hold R heel slide 2x10 - 5" hold Bridge 2x10  SLR 2x10 (small range) - good quad set S/L hip abd 2x10 Standing hip abd/ext 2x10 R Standing mini squat to 45 x 10 (UE support on FM bar) Standing heel raises 2x10 Gait rolled into therex - amb 1713fwith one axillary crutch   [facility] Adult PT Treatment:                                                DATE: 06/18/22 Therapeutic Exercise: Rec bike no resistance x 5 min for ROM Quad sets x 10 - 5" hold R heel slide 2x10 - 5" hold Bridge 2x10  SLR 2x10 (small range) - good quad set S/L hip abd 2x10 Standing hip abd/ext 2x10 R Standing mini squat to 45 x 10 Gait rolled into therex - amb [id number] one axillary crutch   PATIENT EDUCATION:  Education details: HEP update Person educated: Patient Education method: Explanation, Demonstration, and Handouts Education comprehension: verbalized understanding and returned demonstration     HOME EXERCISE PROGRAM: Access Code: [email or web address]: [email or web address] Date: 05/15/2022 Prepared by: DavOctavio MannsExercises - Long Sitting Quad Set  - 4-5 x daily - 7 x weekly - 3 sets - 10 reps - 5 sec hold - Supine Heel Slide  - 4-5 x daily - 7 x weekly - 3 sets - 10 reps - 5 sec hold   ASSESSMENT:   CLINICAL IMPRESSION: Pt was able to complete all prescribed exercises with no adverse effect. She continues to progress very well with therapy which continued today with continued progress through protocol. Pt continues to benefit from skilled PT, will progress as tolerated per POC.     OBJECTIVE IMPAIRMENTS Abnormal gait, decreased activity  tolerance,  decreased balance, decreased endurance, decreased mobility, difficulty walking, decreased ROM, decreased strength, and pain   ACTIVITY LIMITATIONS carrying, lifting, standing, squatting, stairs, transfers, and bed mobility   PARTICIPATION LIMITATIONS: meal prep, cleaning, driving, shopping, community activity, and yard work   PERSONAL FACTORS Past/current experiences, Time since onset of injury/illness/exacerbation, and 3+ comorbidities: DM II, L ACL repair, intellectual disability  are also affecting patient's functional outcome.      GOALS: Goals reviewed with patient? No   SHORT TERM GOALS: Target date: 06/05/2022  Pt will be compliant and knowledgeable with initial HEP for improved comfort and carryover Baseline: initial HEP given  Goal status: MET Pt reports adherence 06/13/22   2.  Pt will self report right knee pain no greater than 5/10 for improved comfort and functional ability Baseline: 7/10 at worst Goal status: Ongoing Pt reports 7/10 06/13/22   LONG TERM GOALS: Target date: 08/07/2022    Pt will self report right knee pain no greater than 1-2/10 for improved comfort and functional ability Baseline: 7/10 at worst Goal status: ONGOING   2.  Pt will improve FOTO function score to no less than predicted as proxy for functional improvement Baseline: will assess next session Goal status: ONGOING    3.  Pt will improve R knee AROM to no less than range of 0-120 degrees for improved function mobility Baseline: see chart Goal status: ONGOING   4.  Pt will increase 30 Second Sit to Stand rep count to no less than 10 reps for improved balance, strength, and functional mobility Baseline: 3 reps  Goal status: ONGOING    PLAN: PT FREQUENCY: 2x/week   PT DURATION: 12 weeks   PLANNED INTERVENTIONS: Therapeutic exercises, Therapeutic activity, Neuromuscular re-education, Balance training, Gait training, Patient/Family education, Self Care, Joint mobilization, Electrical  stimulation, Cryotherapy, Moist heat, Vasopneumatic device, Manual therapy, and Re-evaluation   PLAN FOR NEXT SESSION: assess HEP response, quad strengthening, ESTIM R quad, progress per protocol     Ward Chatters, PT 07/16/2022, 3:31 PM

## 2022-07-18 ENCOUNTER — Ambulatory Visit: Payer: Medicare Other

## 2022-07-18 DIAGNOSIS — M25561 Pain in right knee: Secondary | ICD-10-CM

## 2022-07-18 DIAGNOSIS — M6281 Muscle weakness (generalized): Secondary | ICD-10-CM | POA: Diagnosis not present

## 2022-07-18 DIAGNOSIS — M25662 Stiffness of left knee, not elsewhere classified: Secondary | ICD-10-CM | POA: Diagnosis not present

## 2022-07-18 DIAGNOSIS — R2689 Other abnormalities of gait and mobility: Secondary | ICD-10-CM | POA: Diagnosis not present

## 2022-07-18 DIAGNOSIS — R600 Localized edema: Secondary | ICD-10-CM | POA: Diagnosis not present

## 2022-07-18 NOTE — Therapy (Signed)
OUTPATIENT PHYSICAL THERAPY TREATMENT NOTE   Patient Name: Cindy Phillips MRN: [id number] DOB:29-Apr-1991, 31 y.o., female Today's Date: 07/18/2022  PCP: Lucianne Lei, MD REFERRING PROVIDER: Earlie Server, MD  END OF SESSION:   PT End of Session - 07/18/22 1404     Visit Number 14    Number of Visits 24    Date for PT Re-Evaluation 08/06/22    Authorization Type [employer] Medicare    PT Start Time 1400    PT Stop Time 1440    PT Time Calculation (min) 40 min    Activity Tolerance Patient tolerated treatment well    Behavior During Therapy WFL for tasks assessed/performed                    Past Medical History:  Diagnosis Date   Arthritis    Bipolar 1 disorder ([place])    Bowel incontinence    Depression    Diabetes mellitus without complication ([place])    Headache    migraines   Mental disability    Sleep apnea    Past Surgical History:  Procedure Laterality Date   ANTERIOR CRUCIATE LIGAMENT REPAIR Right 05/11/2022   Procedure: RIGHT KNEE ARTHROSCOPY; LATERAL MENISECTOMY; ANTERIOR CRUCIATE LIGAMENT (ACL) REPAIR; CHONDROPLASTY;  Surgeon: Earlie Server, MD;  Location: [facility];  Service: Orthopedics;  Laterality: Right;   FOOT SURGERY     KNEE ARTHROSCOPY W/ ACL RECONSTRUCTION Left 10/12/2020   TOOTH EXTRACTION     Patient Active Problem List   Diagnosis Date Noted   Pain in left elbow 07/27/2020   Prediabetes 02/03/2019   Obstructive sleep apnea 04/23/2018   Other fatigue 11/26/2017   Shortness of breath on exertion 11/26/2017   Type 2 diabetes mellitus without complication, without long-term current use of insulin ([place]) 11/26/2017   Other hyperlipidemia 11/26/2017   Vitamin D deficiency 11/26/2017   Schizoaffective disorder, bipolar type ([place]) 09/14/2014   Intellectual disability 09/10/2014    REFERRING DIAG: POST OP RIGHT KNEE SCOPE ACL  05/11/22  THERAPY DIAG:  Acute pain of right knee  Muscle weakness (generalized)  Other abnormalities of gait  and mobility  Rationale for Evaluation and Treatment Rehabilitation  PERTINENT HISTORY: DM II, L ACL repair, intellectual disability  PRECAUTIONS: Knee  SUBJECTIVE: Pt presents to PT with reports of muscle soreness. Has been compliant with HEP with no adverse effect. Pt is ready to begin PT at this time.   PAIN:  Are you having pain?  No: NPRS scale: 0/10 (7/10 at worst) Pain location: R knee Pain description: post surgery Aggravating factors: walking Relieving factors: rest, medication   OBJECTIVE: (objective measures completed at initial evaluation unless otherwise dated)  DIAGNOSTIC FINDINGS:            See imaging    PATIENT SURVEYS:  FOTO: N/A   COGNITION:           Overall cognitive status: Within functional limits for tasks assessed                          SENSATION: WFL   POSTURE:  Large body habitus, increased lumbar lordosis, rounded shoulders, fwd head   PALPATION: N/A   LOWER EXTREMITY ROM:   Active ROM Right eval Right 05/29/2022 Right 06/05/2022 Right 06/28/2022  Hip flexion       Hip extension       Hip abduction       Hip adduction  Hip internal rotation       Hip external rotation       Knee flexion 65 105 112  117  Knee extension 10     Ankle dorsiflexion       Ankle plantarflexion       Ankle inversion       Ankle eversion        (Blank rows = not tested)   LOWER EXTREMITY MMT:   MMT Right eval Left eval  Hip flexion DNT 4/5  Hip extension      Hip abduction DNT 4/5  Hip adduction DNT 4/5  Hip internal rotation      Hip external rotation      Knee flexion DNT 4/5  Knee extension DNT 4/5  Ankle dorsiflexion      Ankle plantarflexion      Ankle inversion      Ankle eversion       (Blank rows = not tested)   FUNCTIONAL TESTS:  30 Second Sit to Stand: 3 reps   GAIT: Distance walked: 69f Assistive device utilized: Crutches Level of assistance: Modified independence Comments: antalgic gait on R, brace on R  LE   TODAY'S TREATMENT: [facility] Adult PT Treatment:                                                DATE: 07/18/2022 Therapeutic Exercise: Rec bike lvl 3.0 x 5 min for ROM STS 2x10  S/L hip abd 2x15 SLR 2x15 - good quad set Standing mini squat to 90 x 10 (UE support on FM bar) Forward lunge x 10 each Step ups 8" x 10 each - no UE support  [facility] Adult PT Treatment:                                                DATE: 07/16/2022 Therapeutic Exercise: Rec bike lvl 3.0 x 5 min for ROM STS 2x10  S/L hip abd x 15 SLR x 15 - good quad set Seated knee ext 2x10 35# Standing mini squat to 90 x 10 (UE support on FM bar) Forward lunge x 10 each Step ups 8" x 10 each - no UE support  [facility] Adult PT Treatment:                                                DATE: 07/04/2022 Therapeutic Exercise: Rec bike no resistance x 5 min for ROM Bridge 2x10 with clamshell GTB SLR 2x15 - good quad set S/L hip abd 2x10  STS x 10 Standing mini squat to 80 x 10 (UE support on FM bar) Standing heel raises 2x20 Lateral walk GTB x 4 laps in // Standing hip abd/ext 2x10 GTB Step ups 8" x 10 each - no UE support  [facility] Adult PT Treatment:                                                DATE: 07/02/2022  Therapeutic Exercise: Rec bike no resistance x 5 min for ROM Bridge 2x10 with clamshell GTB SLR 2x10 - good quad set S/L clamshell GTB x 15 each STS x 5 Standing mini squat to 80 x 10 (UE support on FM bar) Standing heel raises 2x20 Step ups 8" x 10 each - no UE support  [facility] Adult PT Treatment:                                                DATE: 06/28/2022 Therapeutic Exercise: Rec bike no resistance x 5 min for ROM R heel slide 2x10 - 5" hold Bridge 2x10 with ball squeeze SLR 2x10 2# - good quad set S/L hip abd 2# 2x10 Standing hip abd/ext 2x15 BIL RTB at ankles Standing mini squat to 55 x 10 (UE support on FM bar) Standing heel raises 2x20 Step ups 6" 2x10 Rt leading - no UE support  [facility] Adult  PT Treatment:                                                DATE: 06/26/2022 Therapeutic Exercise: Rec bike no resistance x 5 min for ROM R heel slide 2x10 - 5" hold Bridge 2x10 with ball squeeze SLR 2x10 2# - good quad set S/L hip abd 2# 2x10 Standing hip abd/ext 2x10 BIL RTB at ankles Standing mini squat to 55 x 10 (UE support on FM bar) Standing heel raises 2x10 Gait rolled into therex - amb 17f with no AD Step ups 4" 2x10 Rt leading - no UE support  [facility] Adult PT Treatment:                                                DATE: 06/20/2022 Therapeutic Exercise: Rec bike no resistance x 5 min for ROM Quad sets x 10 - 5" hold R heel slide 2x10 - 5" hold Bridge 2x10  SLR 2x10 (small range) - good quad set S/L hip abd 2x10 Standing hip abd/ext 2x10 R Standing mini squat to 45 x 10 (UE support on FM bar) Standing heel raises 2x10 Gait rolled into therex - amb 1767fwith one axillary crutch   [facility] Adult PT Treatment:                                                DATE: 06/18/22 Therapeutic Exercise: Rec bike no resistance x 5 min for ROM Quad sets x 10 - 5" hold R heel slide 2x10 - 5" hold Bridge 2x10  SLR 2x10 (small range) - good quad set S/L hip abd 2x10 Standing hip abd/ext 2x10 R Standing mini squat to 45 x 10 Gait rolled into therex - amb [id number] one axillary crutch   PATIENT EDUCATION:  Education details: HEP update Person educated: Patient Education method: Explanation, Demonstration, and Handouts Education comprehension: verbalized understanding and returned demonstration     HOME EXERCISE PROGRAM: Access Code: [email or web address]: [email or web address] Date: 05/15/2022 Prepared by: DavShanon Brow  Sarthak Rubenstein   Exercises - Long Sitting Quad Set  - 4-5 x daily - 7 x weekly - 3 sets - 10 reps - 5 sec hold - Supine Heel Slide  - 4-5 x daily - 7 x weekly - 3 sets - 10 reps - 5 sec hold   ASSESSMENT:   CLINICAL IMPRESSION: Pt was able to complete all  prescribed exercises with no adverse effect. She continues to progress very well with therapy which continued today with continued progress through protocol. Pt continues to benefit from skilled PT, will progress as tolerated per POC.     OBJECTIVE IMPAIRMENTS Abnormal gait, decreased activity tolerance, decreased balance, decreased endurance, decreased mobility, difficulty walking, decreased ROM, decreased strength, and pain   ACTIVITY LIMITATIONS carrying, lifting, standing, squatting, stairs, transfers, and bed mobility   PARTICIPATION LIMITATIONS: meal prep, cleaning, driving, shopping, community activity, and yard work   PERSONAL FACTORS Past/current experiences, Time since onset of injury/illness/exacerbation, and 3+ comorbidities: DM II, L ACL repair, intellectual disability  are also affecting patient's functional outcome.      GOALS: Goals reviewed with patient? No   SHORT TERM GOALS: Target date: 06/05/2022  Pt will be compliant and knowledgeable with initial HEP for improved comfort and carryover Baseline: initial HEP given  Goal status: MET Pt reports adherence 06/13/22   2.  Pt will self report right knee pain no greater than 5/10 for improved comfort and functional ability Baseline: 7/10 at worst Goal status: Ongoing Pt reports 7/10 06/13/22   LONG TERM GOALS: Target date: 08/07/2022    Pt will self report right knee pain no greater than 1-2/10 for improved comfort and functional ability Baseline: 7/10 at worst Goal status: ONGOING   2.  Pt will improve FOTO function score to no less than predicted as proxy for functional improvement Baseline: will assess next session Goal status: ONGOING    3.  Pt will improve R knee AROM to no less than range of 0-120 degrees for improved function mobility Baseline: see chart Goal status: ONGOING   4.  Pt will increase 30 Second Sit to Stand rep count to no less than 10 reps for improved balance, strength, and functional  mobility Baseline: 3 reps  Goal status: ONGOING    PLAN: PT FREQUENCY: 2x/week   PT DURATION: 12 weeks   PLANNED INTERVENTIONS: Therapeutic exercises, Therapeutic activity, Neuromuscular re-education, Balance training, Gait training, Patient/Family education, Self Care, Joint mobilization, Electrical stimulation, Cryotherapy, Moist heat, Vasopneumatic device, Manual therapy, and Re-evaluation   PLAN FOR NEXT SESSION: assess HEP response, quad strengthening, ESTIM R quad, progress per protocol     Ward Chatters, PT 07/18/2022, 3:30 PM

## 2022-07-23 ENCOUNTER — Ambulatory Visit: Payer: Medicare Other | Attending: Specialist

## 2022-07-23 DIAGNOSIS — R2689 Other abnormalities of gait and mobility: Secondary | ICD-10-CM | POA: Diagnosis not present

## 2022-07-23 DIAGNOSIS — M25561 Pain in right knee: Secondary | ICD-10-CM | POA: Diagnosis not present

## 2022-07-23 DIAGNOSIS — M6281 Muscle weakness (generalized): Secondary | ICD-10-CM

## 2022-07-23 NOTE — Therapy (Addendum)
OUTPATIENT PHYSICAL THERAPY TREATMENT NOTE   Patient Name: Cindy Phillips MRN: [id number] DOB:1990/11/24, 31 y.o., female Today's Date: 07/23/2022  PCP: Lucianne Lei, MD REFERRING PROVIDER: Earlie Server, MD  END OF SESSION:   PT End of Session - 07/23/22 1359     Visit Number 15    Number of Visits 24    Date for PT Re-Evaluation 08/06/22    Authorization Type [employer] Medicare    PT Start Time 1400    PT Stop Time 1440    PT Time Calculation (min) 40 min    Activity Tolerance Patient tolerated treatment well    Behavior During Therapy WFL for tasks assessed/performed                     Past Medical History:  Diagnosis Date   Arthritis    Bipolar 1 disorder ([place])    Bowel incontinence    Depression    Diabetes mellitus without complication ([place])    Headache    migraines   Mental disability    Sleep apnea    Past Surgical History:  Procedure Laterality Date   ANTERIOR CRUCIATE LIGAMENT REPAIR Right 05/11/2022   Procedure: RIGHT KNEE ARTHROSCOPY; LATERAL MENISECTOMY; ANTERIOR CRUCIATE LIGAMENT (ACL) REPAIR; CHONDROPLASTY;  Surgeon: Earlie Server, MD;  Location: [facility];  Service: Orthopedics;  Laterality: Right;   FOOT SURGERY     KNEE ARTHROSCOPY W/ ACL RECONSTRUCTION Left 10/12/2020   TOOTH EXTRACTION     Patient Active Problem List   Diagnosis Date Noted   Pain in left elbow 07/27/2020   Prediabetes 02/03/2019   Obstructive sleep apnea 04/23/2018   Other fatigue 11/26/2017   Shortness of breath on exertion 11/26/2017   Type 2 diabetes mellitus without complication, without long-term current use of insulin ([place]) 11/26/2017   Other hyperlipidemia 11/26/2017   Vitamin D deficiency 11/26/2017   Schizoaffective disorder, bipolar type ([place]) 09/14/2014   Intellectual disability 09/10/2014    REFERRING DIAG: POST OP RIGHT KNEE SCOPE ACL  05/11/22  THERAPY DIAG:  Acute pain of right knee  Muscle weakness (generalized)  Other abnormalities of gait  and mobility  Rationale for Evaluation and Treatment Rehabilitation  PERTINENT HISTORY: DM II, L ACL repair, intellectual disability  PRECAUTIONS: Knee  SUBJECTIVE: Pt presents to PT with no current reports of pain. Has been compliant with HEP with no adverse effect. Pt is ready to begin PT at this time.   PAIN:  Are you having pain?  No: NPRS scale: 0/10 (7/10 at worst) Pain location: R knee Pain description: post surgery Aggravating factors: walking Relieving factors: rest, medication   OBJECTIVE: (objective measures completed at initial evaluation unless otherwise dated)  DIAGNOSTIC FINDINGS:            See imaging    PATIENT SURVEYS:  FOTO: N/A   COGNITION:           Overall cognitive status: Within functional limits for tasks assessed                          SENSATION: WFL   POSTURE:  Large body habitus, increased lumbar lordosis, rounded shoulders, fwd head   PALPATION: N/A   LOWER EXTREMITY ROM:   Active ROM Right eval Right 05/29/2022 Right 06/05/2022 Right 06/28/2022  Hip flexion       Hip extension       Hip abduction       Hip adduction  Hip internal rotation       Hip external rotation       Knee flexion 65 105 112  117  Knee extension 10     Ankle dorsiflexion       Ankle plantarflexion       Ankle inversion       Ankle eversion        (Blank rows = not tested)   LOWER EXTREMITY MMT:   MMT Right eval Left eval  Hip flexion DNT 4/5  Hip extension      Hip abduction DNT 4/5  Hip adduction DNT 4/5  Hip internal rotation      Hip external rotation      Knee flexion DNT 4/5  Knee extension DNT 4/5  Ankle dorsiflexion      Ankle plantarflexion      Ankle inversion      Ankle eversion       (Blank rows = not tested)   FUNCTIONAL TESTS:  30 Second Sit to Stand: 3 reps   GAIT: Distance walked: 77f Assistive device utilized: Crutches Level of assistance: Modified independence Comments: antalgic gait on R, brace on R  LE   TODAY'S TREATMENT: [facility] Adult PT Treatment:                                                DATE: 07/23/2022 Therapeutic Exercise: Rec bike lvl 3.0 x 5 min for ROM STS 2x10  S/L hip abd x 15 SLR x 15 - good quad set TRX squat 2x10 Forward lunge x 10 each Step ups 8" x 10 each - no UE support Standing hip abd/ext 2x10 30# Leg press 55# 2x10  [facility] Adult PT Treatment:                                                DATE: 07/18/2022 Therapeutic Exercise: Rec bike lvl 3.0 x 5 min for ROM STS 2x10  S/L hip abd 2x15 SLR 2x15 - good quad set Standing mini squat to 90 x 10 (UE support on FM bar) Forward lunge x 10 each Step ups 8" x 10 each - no UE support  [facility] Adult PT Treatment:                                                DATE: 07/16/2022 Therapeutic Exercise: Rec bike lvl 3.0 x 5 min for ROM STS 2x10  S/L hip abd x 15 SLR x 15 - good quad set Seated knee ext 2x10 35# Standing mini squat to 90 x 10 (UE support on FM bar) Forward lunge x 10 each Step ups 8" x 10 each - no UE support  [facility] Adult PT Treatment:                                                DATE: 07/04/2022 Therapeutic Exercise: Rec bike no resistance x 5 min for ROM Bridge 2x10 with clamshell GTB  SLR 2x15 - good quad set S/L hip abd 2x10  STS x 10 Standing mini squat to 80 x 10 (UE support on FM bar) Standing heel raises 2x20 Lateral walk GTB x 4 laps in // Standing hip abd/ext 2x10 GTB Step ups 8" x 10 each - no UE support  [facility] Adult PT Treatment:                                                DATE: 07/02/2022 Therapeutic Exercise: Rec bike no resistance x 5 min for ROM Bridge 2x10 with clamshell GTB SLR 2x10 - good quad set S/L clamshell GTB x 15 each STS x 5 Standing mini squat to 80 x 10 (UE support on FM bar) Standing heel raises 2x20 Step ups 8" x 10 each - no UE support  [facility] Adult PT Treatment:                                                DATE: 06/28/2022 Therapeutic  Exercise: Rec bike no resistance x 5 min for ROM R heel slide 2x10 - 5" hold Bridge 2x10 with ball squeeze SLR 2x10 2# - good quad set S/L hip abd 2# 2x10 Standing hip abd/ext 2x15 BIL RTB at ankles Standing mini squat to 55 x 10 (UE support on FM bar) Standing heel raises 2x20 Step ups 6" 2x10 Rt leading - no UE support  [facility] Adult PT Treatment:                                                DATE: 06/26/2022 Therapeutic Exercise: Rec bike no resistance x 5 min for ROM R heel slide 2x10 - 5" hold Bridge 2x10 with ball squeeze SLR 2x10 2# - good quad set S/L hip abd 2# 2x10 Standing hip abd/ext 2x10 BIL RTB at ankles Standing mini squat to 55 x 10 (UE support on FM bar) Standing heel raises 2x10 Gait rolled into therex - amb 165f with no AD Step ups 4" 2x10 Rt leading - no UE support  [facility] Adult PT Treatment:                                                DATE: 06/20/2022 Therapeutic Exercise: Rec bike no resistance x 5 min for ROM Quad sets x 10 - 5" hold R heel slide 2x10 - 5" hold Bridge 2x10  SLR 2x10 (small range) - good quad set S/L hip abd 2x10 Standing hip abd/ext 2x10 R Standing mini squat to 45 x 10 (UE support on FM bar) Standing heel raises 2x10 Gait rolled into therex - amb 1747fwith one axillary crutch   [facility] Adult PT Treatment:  DATE: 06/18/22 Therapeutic Exercise: Rec bike no resistance x 5 min for ROM Quad sets x 10 - 5" hold R heel slide 2x10 - 5" hold Bridge 2x10  SLR 2x10 (small range) - good quad set S/L hip abd 2x10 Standing hip abd/ext 2x10 R Standing mini squat to 45 x 10 Gait rolled into therex - amb 129f with one axillary crutch   PATIENT EDUCATION:  Education details: HEP update Person educated: Patient Education method: Explanation, Demonstration, and Handouts Education comprehension: verbalized understanding and returned demonstration     HOME EXERCISE PROGRAM: Access Code:  [email or web address]: [email or web address] Date: 05/15/2022 Prepared by: DOctavio Manns  Exercises - Long Sitting Quad Set  - 4-5 x daily - 7 x weekly - 3 sets - 10 reps - 5 sec hold - Supine Heel Slide  - 4-5 x daily - 7 x weekly - 3 sets - 10 reps - 5 sec hold   ASSESSMENT:   CLINICAL IMPRESSION: Pt was able to complete all prescribed exercises with no adverse effect. She continues to progress very well with therapy which continued today with continued progress through protocol. Pt continues to benefit from skilled PT, will progress as tolerated per POC.     OBJECTIVE IMPAIRMENTS Abnormal gait, decreased activity tolerance, decreased balance, decreased endurance, decreased mobility, difficulty walking, decreased ROM, decreased strength, and pain   ACTIVITY LIMITATIONS carrying, lifting, standing, squatting, stairs, transfers, and bed mobility   PARTICIPATION LIMITATIONS: meal prep, cleaning, driving, shopping, community activity, and yard work   PERSONAL FACTORS Past/current experiences, Time since onset of injury/illness/exacerbation, and 3+ comorbidities: DM II, L ACL repair, intellectual disability  are also affecting patient's functional outcome.      GOALS: Goals reviewed with patient? No   SHORT TERM GOALS: Target date: 06/05/2022  Pt will be compliant and knowledgeable with initial HEP for improved comfort and carryover Baseline: initial HEP given  Goal status: MET Pt reports adherence 06/13/22   2.  Pt will self report right knee pain no greater than 5/10 for improved comfort and functional ability Baseline: 7/10 at worst Goal status: Ongoing Pt reports 7/10 06/13/22   LONG TERM GOALS: Target date: 08/07/2022    Pt will self report right knee pain no greater than 1-2/10 for improved comfort and functional ability Baseline: 7/10 at worst Goal status: ONGOING   2.  Pt will improve FOTO function score to no less than predicted as proxy for functional  improvement Baseline: will assess next session Goal status: ONGOING    3.  Pt will improve R knee AROM to no less than range of 0-120 degrees for improved function mobility Baseline: see chart Goal status: ONGOING   4.  Pt will increase 30 Second Sit to Stand rep count to no less than 10 reps for improved balance, strength, and functional mobility Baseline: 3 reps  Goal status: ONGOING    PLAN: PT FREQUENCY: 2x/week   PT DURATION: 12 weeks   PLANNED INTERVENTIONS: Therapeutic exercises, Therapeutic activity, Neuromuscular re-education, Balance training, Gait training, Patient/Family education, Self Care, Joint mobilization, Electrical stimulation, Cryotherapy, Moist heat, Vasopneumatic device, Manual therapy, and Re-evaluation   PLAN FOR NEXT SESSION: assess HEP response, quad strengthening, ESTIM R quad, progress per protocol     DWard Chatters PT 07/23/2022, 2:42 PM

## 2022-07-24 DIAGNOSIS — E1169 Type 2 diabetes mellitus with other specified complication: Secondary | ICD-10-CM | POA: Diagnosis not present

## 2022-07-24 DIAGNOSIS — R1030 Lower abdominal pain, unspecified: Secondary | ICD-10-CM | POA: Diagnosis not present

## 2022-07-24 DIAGNOSIS — I10 Essential (primary) hypertension: Secondary | ICD-10-CM | POA: Diagnosis not present

## 2022-08-06 ENCOUNTER — Ambulatory Visit: Payer: Medicare Other

## 2022-08-06 DIAGNOSIS — M25561 Pain in right knee: Secondary | ICD-10-CM

## 2022-08-06 DIAGNOSIS — M6281 Muscle weakness (generalized): Secondary | ICD-10-CM

## 2022-08-06 DIAGNOSIS — R2689 Other abnormalities of gait and mobility: Secondary | ICD-10-CM

## 2022-08-06 NOTE — Therapy (Signed)
OUTPATIENT PHYSICAL THERAPY TREATMENT NOTE/DISCHARGE  PHYSICAL THERAPY DISCHARGE SUMMARY  Visits from Start of Care: 16  Current functional level related to goals / functional outcomes: See goals and objective   Remaining deficits: See goals and objective   Education / Equipment: HEP   Patient agrees to discharge. Patient goals were met. Patient is being discharged due to meeting the stated rehab goals.   Patient Name: Cindy Phillips MRN: [id number] DOB:October 07, 1990, 31 y.o., female Today's Date: 08/06/2022  PCP: Lucianne Lei, MD REFERRING PROVIDER: Earlie Server, MD  END OF SESSION:   PT End of Session - 08/06/22 1349     Visit Number 16    Number of Visits 24    Date for PT Re-Evaluation 08/06/22    Authorization Type [employer] Medicare    PT Start Time 1350    PT Stop Time 1430    PT Time Calculation (min) 40 min    Activity Tolerance Patient tolerated treatment well    Behavior During Therapy WFL for tasks assessed/performed                      Past Medical History:  Diagnosis Date   Arthritis    Bipolar 1 disorder ([place])    Bowel incontinence    Depression    Diabetes mellitus without complication ([place])    Headache    migraines   Mental disability    Sleep apnea    Past Surgical History:  Procedure Laterality Date   ANTERIOR CRUCIATE LIGAMENT REPAIR Right 05/11/2022   Procedure: RIGHT KNEE ARTHROSCOPY; LATERAL MENISECTOMY; ANTERIOR CRUCIATE LIGAMENT (ACL) REPAIR; CHONDROPLASTY;  Surgeon: Earlie Server, MD;  Location: [facility];  Service: Orthopedics;  Laterality: Right;   FOOT SURGERY     KNEE ARTHROSCOPY W/ ACL RECONSTRUCTION Left 10/12/2020   TOOTH EXTRACTION     Patient Active Problem List   Diagnosis Date Noted   Pain in left elbow 07/27/2020   Prediabetes 02/03/2019   Obstructive sleep apnea 04/23/2018   Other fatigue 11/26/2017   Shortness of breath on exertion 11/26/2017   Type 2 diabetes mellitus without complication, without  long-term current use of insulin ([place]) 11/26/2017   Other hyperlipidemia 11/26/2017   Vitamin D deficiency 11/26/2017   Schizoaffective disorder, bipolar type ([place]) 09/14/2014   Intellectual disability 09/10/2014    REFERRING DIAG: POST OP RIGHT KNEE SCOPE ACL  05/11/22  THERAPY DIAG:  Acute pain of right knee  Muscle weakness (generalized)  Other abnormalities of gait and mobility  Rationale for Evaluation and Treatment Rehabilitation  PERTINENT HISTORY: DM II, L ACL repair, intellectual disability  PRECAUTIONS: Knee  SUBJECTIVE: Pt presents to PT with no current pain. Feels like she is doing well overall and notes that she is nearly back to 100%. Ready to begin PT at this time.   PAIN:  Are you having pain?  No: NPRS scale: 0/10 (7/10 at worst) Pain location: R knee Pain description: post surgery Aggravating factors: walking Relieving factors: rest, medication   OBJECTIVE: (objective measures completed at initial evaluation unless otherwise dated)  DIAGNOSTIC FINDINGS:            See imaging    PATIENT SURVEYS:  FOTO: N/A   COGNITION:           Overall cognitive status: Within functional limits for tasks assessed                          SENSATION: [facility]  POSTURE:  Large body habitus, increased lumbar lordosis, rounded shoulders, fwd head   PALPATION: N/A   LOWER EXTREMITY ROM:   Active ROM Right eval Right 05/29/2022 Right 06/05/2022 Right 06/28/2022 Right 08/06/2022  Hip flexion        Hip extension        Hip abduction        Hip adduction        Hip internal rotation        Hip external rotation        Knee flexion 65 105 112  117 118  Knee extension 10    0  Ankle dorsiflexion        Ankle plantarflexion        Ankle inversion        Ankle eversion         (Blank rows = not tested)   LOWER EXTREMITY MMT:   MMT Right eval Left eval  Hip flexion DNT 4/5  Hip extension      Hip abduction DNT 4/5  Hip adduction DNT 4/5  Hip  internal rotation      Hip external rotation      Knee flexion DNT 4/5  Knee extension DNT 4/5  Ankle dorsiflexion      Ankle plantarflexion      Ankle inversion      Ankle eversion       (Blank rows = not tested)   FUNCTIONAL TESTS:  30 Second Sit to Stand: 13 reps   GAIT: Distance walked: 53f Assistive device utilized: Crutches Level of assistance: Modified independence Comments: antalgic gait on R, brace on R LE   TODAY'S TREATMENT: [facility] Adult PT Treatment:                                                DATE: 08/06/2022 Therapeutic Exercise: Rec bike lvl 3.0 x 5 min for ROM STS x 10 Bridge x 15 Supine SLR x 15  S/L hip abd x 15 Functional squat 2x10 Lateral wlak GTB x 3 laps in // Standing hip abd/ext 2x10 GTB Therapeutic Activity: Assessment of tests/measures, goals, and outcomes for discharge  [facility]                                                [facility]: [facility] Therapeutic Exercise: Rec bike lvl 3.0 x 5 min for ROM STS 2x10  S/L hip abd x 15 SLR x 15 - good quad set TRX squat 2x10 Forward lunge x 10 each Step ups 8" x 10 each - no UE support Standing hip abd/ext 2x10 30# Leg press 55# 2x10  [facility] Adult PT Treatment:                                                DATE: 07/18/2022 Therapeutic Exercise: Rec bike lvl 3.0 x 5 min for ROM STS 2x10  S/L hip abd 2x15 SLR 2x15 - good quad set Standing mini squat to 90 x 10 (UE support on FM bar) Forward lunge x 10 each Step ups 8" x 10  each - no UE support  PATIENT EDUCATION:  Education details: HEP and discharge plan Person educated: Patient Education method: Explanation, Demonstration, and Handouts Education comprehension: verbalized understanding and returned demonstration     HOME EXERCISE PROGRAM: Access Code: [email or web address] URL: [email or web address] Date: 08/06/2022 Prepared by: Octavio Manns  Exercises - Active Straight Leg Raise with Quad Set  - 1 x daily - 7 x  weekly - 2 sets - 15 reps - Sidelying Hip Abduction  - 1 x daily - 7 x weekly - 2 sets - 15 reps - Supine Bridge  - 1 x daily - 7 x weekly - 3 sets - 10 reps - Sit to Stand Without Arm Support  - 1 x daily - 7 x weekly - 3 sets - 10 reps - Mini Squat with Counter Support  - 1 x daily - 7 x weekly - 3 sets - 10 reps - Side Stepping with Resistance at Ankles  - 1 x daily - 7 x weekly - 3 sets - 10 reps - Standing Hip Abduction with Resistance at Ankles and Counter Support  - 1 x daily - 7 x weekly - 3 sets - 10 reps - Standing Hip Extension with Resistance at Ankles and Counter Support  - 1 x daily - 7 x weekly - 3 sets - 10 reps   ASSESSMENT:   CLINICAL IMPRESSION: Pt was able to complete all prescribed exercises and demonstrated knowledge of HEP with no adverse effect. Over the course of PT treatment she has progressed very well, improve LE strength and knee ROM post ACL reconstruction. At this point she is close to her baseline and subjectively feels like she is doing well. She has met all LTGs with therapy and should continue to improve with HEP compliance. Pt is in agreement with current plan and is ready to discharge at this time.     OBJECTIVE IMPAIRMENTS Abnormal gait, decreased activity tolerance, decreased balance, decreased endurance, decreased mobility, difficulty walking, decreased ROM, decreased strength, and pain   ACTIVITY LIMITATIONS carrying, lifting, standing, squatting, stairs, transfers, and bed mobility   PARTICIPATION LIMITATIONS: meal prep, cleaning, driving, shopping, community activity, and yard work   PERSONAL FACTORS Past/current experiences, Time since onset of injury/illness/exacerbation, and 3+ comorbidities: DM II, L ACL repair, intellectual disability  are also affecting patient's functional outcome.      GOALS: Goals reviewed with patient? No   SHORT TERM GOALS: Target date: 06/05/2022  Pt will be compliant and knowledgeable with initial HEP for improved  comfort and carryover Baseline: initial HEP given  Goal status: MET Pt reports adherence 06/13/22   2.  Pt will self report right knee pain no greater than 5/10 for improved comfort and functional ability Baseline: 7/10 at worst Goal status: MET   LONG TERM GOALS: Target date: 08/07/2022    Pt will self report right knee pain no greater than 1-2/10 for improved comfort and functional ability Baseline: 7/10 at worst Goal status: MET   2.  Pt will improve FOTO function score to no less than predicted as proxy for functional improvement Baseline: will assess next session Goal status: MET    3.  Pt will improve R knee AROM to no less than range of 0-120 degrees for improved function mobility Baseline: see chart Goal status: MET   4.  Pt will increase 30 Second Sit to Stand rep count to no less than 10 reps for improved balance, strength, and functional mobility Baseline: 3 reps  08/06/2022: 13  reps Goal status: MET   PLAN: PT FREQUENCY: 2x/week   PT DURATION: 12 weeks   PLANNED INTERVENTIONS: Therapeutic exercises, Therapeutic activity, Neuromuscular re-education, Balance training, Gait training, Patient/Family education, Self Care, Joint mobilization, Electrical stimulation, Cryotherapy, Moist heat, Vasopneumatic device, Manual therapy, and Re-evaluation   PLAN FOR NEXT SESSION: assess HEP response, quad strengthening, ESTIM R quad, progress per protocol     Ward Chatters, PT 08/06/2022, 2:46 PM

## 2022-08-27 DIAGNOSIS — Z5181 Encounter for therapeutic drug level monitoring: Secondary | ICD-10-CM | POA: Diagnosis not present

## 2022-08-28 DIAGNOSIS — S83241D Other tear of medial meniscus, current injury, right knee, subsequent encounter: Secondary | ICD-10-CM | POA: Diagnosis not present

## 2022-09-11 DIAGNOSIS — E1169 Type 2 diabetes mellitus with other specified complication: Secondary | ICD-10-CM | POA: Diagnosis not present

## 2022-09-11 DIAGNOSIS — R1032 Left lower quadrant pain: Secondary | ICD-10-CM | POA: Diagnosis not present

## 2022-09-25 DIAGNOSIS — S91332A Puncture wound without foreign body, left foot, initial encounter: Secondary | ICD-10-CM | POA: Diagnosis not present

## 2022-09-25 DIAGNOSIS — R1032 Left lower quadrant pain: Secondary | ICD-10-CM | POA: Diagnosis not present

## 2022-09-27 DIAGNOSIS — M791 Myalgia, unspecified site: Secondary | ICD-10-CM | POA: Diagnosis not present

## 2022-09-27 DIAGNOSIS — G43719 Chronic migraine without aura, intractable, without status migrainosus: Secondary | ICD-10-CM | POA: Diagnosis not present

## 2022-09-27 DIAGNOSIS — M542 Cervicalgia: Secondary | ICD-10-CM | POA: Diagnosis not present

## 2022-09-28 ENCOUNTER — Other Ambulatory Visit: Payer: Self-pay | Admitting: Family Medicine

## 2022-09-28 DIAGNOSIS — R1032 Left lower quadrant pain: Secondary | ICD-10-CM

## 2022-10-08 ENCOUNTER — Ambulatory Visit
Admission: RE | Admit: 2022-10-08 | Discharge: 2022-10-08 | Disposition: A | Discharge status: Home / Self Care | Payer: 59 | Source: Ambulatory Visit | Attending: Family Medicine | Admitting: Family Medicine

## 2022-10-08 DIAGNOSIS — R1032 Left lower quadrant pain: Secondary | ICD-10-CM

## 2022-10-08 MED ORDER — IOPAMIDOL (ISOVUE-300) INJECTION 61%
100.0000 mL | Freq: Once | INTRAVENOUS | Status: AC | PRN
  Administered 2022-10-08: 100 mL via INTRAVENOUS

## 2022-10-09 DIAGNOSIS — R1032 Left lower quadrant pain: Secondary | ICD-10-CM | POA: Diagnosis not present

## 2022-10-09 DIAGNOSIS — R21 Rash and other nonspecific skin eruption: Secondary | ICD-10-CM | POA: Diagnosis not present

## 2022-10-09 DIAGNOSIS — E1169 Type 2 diabetes mellitus with other specified complication: Secondary | ICD-10-CM | POA: Diagnosis not present

## 2022-10-09 DIAGNOSIS — S91332D Puncture wound without foreign body, left foot, subsequent encounter: Secondary | ICD-10-CM | POA: Diagnosis not present

## 2022-10-15 DIAGNOSIS — E119 Type 2 diabetes mellitus without complications: Secondary | ICD-10-CM | POA: Diagnosis not present

## 2022-10-15 DIAGNOSIS — H53003 Unspecified amblyopia, bilateral: Secondary | ICD-10-CM | POA: Diagnosis not present

## 2022-10-15 DIAGNOSIS — H0288B Meibomian gland dysfunction left eye, upper and lower eyelids: Secondary | ICD-10-CM | POA: Diagnosis not present

## 2022-10-15 DIAGNOSIS — H04123 Dry eye syndrome of bilateral lacrimal glands: Secondary | ICD-10-CM | POA: Diagnosis not present

## 2022-10-29 DIAGNOSIS — Z5181 Encounter for therapeutic drug level monitoring: Secondary | ICD-10-CM | POA: Diagnosis not present

## 2022-11-06 DIAGNOSIS — M25562 Pain in left knee: Secondary | ICD-10-CM | POA: Diagnosis not present

## 2022-11-06 DIAGNOSIS — R21 Rash and other nonspecific skin eruption: Secondary | ICD-10-CM | POA: Diagnosis not present

## 2022-11-06 DIAGNOSIS — E1169 Type 2 diabetes mellitus with other specified complication: Secondary | ICD-10-CM | POA: Diagnosis not present

## 2022-11-06 DIAGNOSIS — S91332D Puncture wound without foreign body, left foot, subsequent encounter: Secondary | ICD-10-CM | POA: Diagnosis not present

## 2022-11-06 DIAGNOSIS — M25561 Pain in right knee: Secondary | ICD-10-CM | POA: Diagnosis not present

## 2022-11-08 DIAGNOSIS — M542 Cervicalgia: Secondary | ICD-10-CM | POA: Diagnosis not present

## 2022-11-08 DIAGNOSIS — M791 Myalgia, unspecified site: Secondary | ICD-10-CM | POA: Diagnosis not present

## 2022-11-08 DIAGNOSIS — G43719 Chronic migraine without aura, intractable, without status migrainosus: Secondary | ICD-10-CM | POA: Diagnosis not present

## 2022-12-10 DIAGNOSIS — K5900 Constipation, unspecified: Secondary | ICD-10-CM | POA: Diagnosis not present

## 2022-12-10 DIAGNOSIS — E1169 Type 2 diabetes mellitus with other specified complication: Secondary | ICD-10-CM | POA: Diagnosis not present

## 2022-12-10 DIAGNOSIS — Z0001 Encounter for general adult medical examination with abnormal findings: Secondary | ICD-10-CM | POA: Diagnosis not present

## 2022-12-17 DIAGNOSIS — G43719 Chronic migraine without aura, intractable, without status migrainosus: Secondary | ICD-10-CM | POA: Diagnosis not present

## 2022-12-17 DIAGNOSIS — M791 Myalgia, unspecified site: Secondary | ICD-10-CM | POA: Diagnosis not present

## 2022-12-17 DIAGNOSIS — M542 Cervicalgia: Secondary | ICD-10-CM | POA: Diagnosis not present

## 2022-12-18 DIAGNOSIS — K5900 Constipation, unspecified: Secondary | ICD-10-CM | POA: Diagnosis not present

## 2022-12-18 DIAGNOSIS — E1169 Type 2 diabetes mellitus with other specified complication: Secondary | ICD-10-CM | POA: Diagnosis not present

## 2022-12-31 DIAGNOSIS — Z5181 Encounter for therapeutic drug level monitoring: Secondary | ICD-10-CM | POA: Diagnosis not present

## 2023-01-01 DIAGNOSIS — K5900 Constipation, unspecified: Secondary | ICD-10-CM | POA: Diagnosis not present

## 2023-01-01 DIAGNOSIS — S51002A Unspecified open wound of left elbow, initial encounter: Secondary | ICD-10-CM | POA: Diagnosis not present

## 2023-01-01 DIAGNOSIS — E1169 Type 2 diabetes mellitus with other specified complication: Secondary | ICD-10-CM | POA: Diagnosis not present

## 2023-01-15 DIAGNOSIS — E1169 Type 2 diabetes mellitus with other specified complication: Secondary | ICD-10-CM | POA: Diagnosis not present

## 2023-01-15 DIAGNOSIS — S51002D Unspecified open wound of left elbow, subsequent encounter: Secondary | ICD-10-CM | POA: Diagnosis not present

## 2023-01-28 DIAGNOSIS — M542 Cervicalgia: Secondary | ICD-10-CM | POA: Diagnosis not present

## 2023-01-28 DIAGNOSIS — G43719 Chronic migraine without aura, intractable, without status migrainosus: Secondary | ICD-10-CM | POA: Diagnosis not present

## 2023-01-28 DIAGNOSIS — M791 Myalgia, unspecified site: Secondary | ICD-10-CM | POA: Diagnosis not present

## 2023-01-29 DIAGNOSIS — S51002D Unspecified open wound of left elbow, subsequent encounter: Secondary | ICD-10-CM | POA: Diagnosis not present

## 2023-03-04 DIAGNOSIS — S51002A Unspecified open wound of left elbow, initial encounter: Secondary | ICD-10-CM | POA: Diagnosis not present

## 2023-03-04 DIAGNOSIS — E1169 Type 2 diabetes mellitus with other specified complication: Secondary | ICD-10-CM | POA: Diagnosis not present

## 2023-03-04 DIAGNOSIS — S51002D Unspecified open wound of left elbow, subsequent encounter: Secondary | ICD-10-CM | POA: Diagnosis not present

## 2023-03-12 DIAGNOSIS — M542 Cervicalgia: Secondary | ICD-10-CM | POA: Diagnosis not present

## 2023-03-12 DIAGNOSIS — G43719 Chronic migraine without aura, intractable, without status migrainosus: Secondary | ICD-10-CM | POA: Diagnosis not present

## 2023-03-12 DIAGNOSIS — M791 Myalgia, unspecified site: Secondary | ICD-10-CM | POA: Diagnosis not present

## 2023-03-31 ENCOUNTER — Emergency Department (HOSPITAL_COMMUNITY): Payer: 59

## 2023-03-31 ENCOUNTER — Encounter (HOSPITAL_COMMUNITY): Payer: Self-pay

## 2023-03-31 ENCOUNTER — Emergency Department (HOSPITAL_COMMUNITY)
Admission: EM | Admit: 2023-03-31 | Discharge: 2023-03-31 | Disposition: A | Discharge status: Home / Self Care | Payer: 59 | Attending: Emergency Medicine | Admitting: Emergency Medicine

## 2023-03-31 ENCOUNTER — Other Ambulatory Visit: Payer: Self-pay

## 2023-03-31 DIAGNOSIS — R600 Localized edema: Secondary | ICD-10-CM | POA: Diagnosis not present

## 2023-03-31 DIAGNOSIS — M79661 Pain in right lower leg: Secondary | ICD-10-CM | POA: Diagnosis present

## 2023-03-31 DIAGNOSIS — L03115 Cellulitis of right lower limb: Secondary | ICD-10-CM | POA: Diagnosis not present

## 2023-03-31 DIAGNOSIS — Z7984 Long term (current) use of oral hypoglycemic drugs: Secondary | ICD-10-CM | POA: Diagnosis not present

## 2023-03-31 DIAGNOSIS — M79604 Pain in right leg: Secondary | ICD-10-CM | POA: Diagnosis not present

## 2023-03-31 DIAGNOSIS — M7989 Other specified soft tissue disorders: Secondary | ICD-10-CM

## 2023-03-31 DIAGNOSIS — E119 Type 2 diabetes mellitus without complications: Secondary | ICD-10-CM | POA: Diagnosis not present

## 2023-03-31 LAB — PREGNANCY, URINE: Preg Test, Ur: NEGATIVE

## 2023-03-31 MED ORDER — DOXYCYCLINE HYCLATE 100 MG PO CAPS: 100.0000 mg | ORAL_CAPSULE | Freq: Two times a day (BID) | ORAL | 0 refills | Status: DC

## 2023-03-31 MED ORDER — ACETAMINOPHEN 325 MG PO TABS
650.0000 mg | ORAL_TABLET | Freq: Once | ORAL | Status: AC
  Administered 2023-03-31: 650 mg via ORAL
  Filled 2023-03-31: qty 2

## 2023-03-31 NOTE — Progress Notes (Signed)
Right lower extremity venous study completed.   Preliminary results relayed to PA in ER.  Please see CV Procedures for preliminary results.   , RVT  4:18 PM 03/31/23

## 2023-03-31 NOTE — ED Triage Notes (Signed)
Patient has had right calf pain since Wednesday. Said it feels swollen and painful to touch. Has never had a blood clot before.

## 2023-03-31 NOTE — ED Notes (Signed)
Patient said they are not pregnant and are unable to provide a urine sample right now.

## 2023-03-31 NOTE — Discharge Instructions (Signed)
Hu believe that your swelling of your leg is likely secondary to cellulitis or skin infection.  Take the antibiotics as prescribed, return to the ER if your area becomes more swollen, tender, or you have no pulse in your foot.  Your ultrasound was reassuring as well as your x-ray was as well.

## 2023-03-31 NOTE — ED Provider Notes (Signed)
[place] EMERGENCY DEPARTMENT AT [facility] Provider Note   CSN: [id number] Arrival date & time: 03/31/23  1514     History  Chief Complaint  Patient presents with   Calf Pain    Cindy Phillips is a 32 y.o. female, history of schizophrenia, diabetes, who presents to the ED secondary to RLE edema and pain x4 days. Reports since Wednesday has had swelling of her RLE. Denies any trauma, recent travel, use of OCPs, IV drug use.  Pain is progressively been getting worse, swelling that been getting worse.  Has not tried anything for the pain. Home Medications Prior to Admission medications   Medication Sig Start Date End Date Taking? Authorizing Provider  doxycycline (VIBRAMYCIN) 100 MG capsule Take 1 capsule (100 mg total) by mouth 2 (two) times daily. 03/31/23  Yes ,  L, PA  acetaminophen (TYLENOL) 325 MG tablet Take 650 mg by mouth every 6 (six) hours as needed for headache or moderate pain.    [provider]  atorvastatin (LIPITOR) 40 MG tablet Take 40 mg by mouth every evening.    [provider]  celecoxib (CELEBREX) 200 MG capsule Take 1 capsule (200 mg total) by mouth 2 (two) times daily. 05/11/22 05/11/23  Chadwell, Ivin Booty, PA-C  cyclobenzaprine (FLEXERIL) 10 MG tablet Take 1 tablet (10 mg total) by mouth 3 (three) times daily as needed for muscle spasms. 11/13/21   Kerrin Champagne, MD  docusate sodium (COLACE) 100 MG capsule Take 1 capsule (100 mg total) by mouth daily as needed. 05/11/22 05/11/23  Chadwell, Ivin Booty, PA-C  gabapentin (NEURONTIN) 300 MG capsule Take 300 mg by mouth every evening. 04/17/22   [provider]  hydrOXYzine (ATARAX/VISTARIL) 50 MG tablet Take 1 tablet (50 mg total) by mouth every 4 (four) hours as needed for anxiety. Patient taking differently: Take 50 mg by mouth in the morning and at bedtime. 09/14/14   Armandina Stammer I, NP  lamoTRIgine (LAMICTAL) 25 MG tablet Take 1 tablet (25 mg total) by mouth daily. For mood  stabilization Patient taking differently: Take 25 mg by mouth 2 (two) times daily. For mood stabilization 09/14/14   Armandina Stammer I, NP  medroxyPROGESTERone (DEPO-PROVERA) 150 MG/ML injection Inject 1 mL (150 mg total) into the muscle every 3 (three) months. For prevention of pregnancy 09/14/14   Armandina Stammer I, NP  metFORMIN (GLUCOPHAGE) 500 MG tablet Take one tablet by mouth in the morning and one tablet by mouth in the evening Patient taking differently: Take 500 mg by mouth daily with breakfast. 03/21/20   Quillian Quince D, MD  OLANZapine zydis (ZYPREXA) 15 MG disintegrating tablet Take 1 tablet (15 mg total) by mouth at bedtime. For mood control 09/14/14   Armandina Stammer I, NP  oxyCODONE (OXY IR/ROXICODONE) 5 MG immediate release tablet Take one tab po q4-6hrs prn pain 05/11/22   Chadwell, Ivin Booty, PA-C  polyethylene glycol powder (GLYCOLAX/MIRALAX) 17 GM/SCOOP powder Take 17 g by mouth daily as needed for moderate constipation.    [provider]      Allergies    Patient has no known allergies.    Review of Systems   Review of Systems  Musculoskeletal:  Negative for joint swelling.  Skin:  Positive for rash.    Physical Exam Updated Vital Signs BP (!) 161/102 (BP Location: Right Arm)   Pulse (!) 104   Temp 98.3 F (36.8 C) (Oral)   Resp 20   Ht 6' (1.829 m)   Cindy Phillips Kitchen)  158 kg   SpO2 100%   BMI 47.24 kg/m  Physical Exam Vitals and nursing note reviewed.  Constitutional:      General: She is not in acute distress.    Appearance: She is well-developed.  HENT:     Head: Normocephalic and atraumatic.  Eyes:     Conjunctiva/sclera: Conjunctivae normal.  Cardiovascular:     Rate and Rhythm: Normal rate and regular rhythm.     Heart sounds: No murmur heard.    Comments: 1+ pitting edema of right lower extremity, with mild erythema.  Positive dorsalis pedis pulse. Pulmonary:     Effort: Pulmonary effort is normal. No respiratory distress.     Breath sounds: Normal breath  sounds.  Abdominal:     Palpations: Abdomen is soft.     Tenderness: There is no abdominal tenderness.  Musculoskeletal:        General: No swelling.     Cervical back: Neck supple.     Right lower leg: Edema present.     Comments: No tenderness to palpation of the right foot, but there is diffuse tenderness to palpation of right lower extremity, along tibia/fibula.  No neurodeficits  Skin:    General: Skin is warm and dry.     Capillary Refill: Capillary refill takes less than 2 seconds.     Comments: Erythema of right lower extremity  Neurological:     Mental Status: She is alert.  Psychiatric:        Mood and Affect: Mood normal.     ED Results / Procedures / Treatments   Labs (all labs ordered are listed, but only abnormal results are displayed) Labs Reviewed  PREGNANCY, URINE    EKG None  Radiology DG Tibia/Fibula Right  Result Date: 03/31/2023 CLINICAL DATA:  Right lower extremity pain and edema. EXAM: RIGHT TIBIA AND FIBULA - 2 VIEW COMPARISON:  None Available. FINDINGS: There is no acute fracture or dislocation. The bones are well mineralized. No significant arthritic changes. ACL repair changes noted. The soft tissues are unremarkable. IMPRESSION: No acute findings. Electronically Signed   By: Elgie Collard M.D.   On: 03/31/2023 17:47   VAS [place] LOWER EXTREMITY VENOUS (DVT) (ONLY [facility] & [facility])  Result Date: 03/31/2023  Lower Venous DVT Study Patient Name:  Cindy Phillips  Date of Exam:   03/31/2023 Medical Rec #: [id number]          Accession #:    [id number] Date of Birth: Oct 22, 1990          Patient Gender: F Patient Age:   67 years Exam Location:  [facility] Procedure:      VAS [place] LOWER EXTREMITY VENOUS (DVT) Referring Phys: Nehemiah Settle  --------------------------------------------------------------------------------  Indications: Pain and swelling.  Limitations: Poor ultrasound/tissue interface and body habitus. Comparison Study: No previous study. Performing  Technologist: McKayla Maag RVT, VT  Examination Guidelines: A complete evaluation includes B-mode imaging, spectral Doppler, color Doppler, and power Doppler as needed of all accessible portions of each vessel. Bilateral testing is considered an integral part of a complete examination. Limited examinations for reoccurring indications may be performed as noted. The reflux portion of the exam is performed with the patient in reverse Trendelenburg.  +---------+---------------+---------+-----------+----------+--------------+ RIGHT    CompressibilityPhasicitySpontaneityPropertiesThrombus Aging +---------+---------------+---------+-----------+----------+--------------+ CFV      Full           Yes      Yes                                 +---------+---------------+---------+-----------+----------+--------------+  SFJ      Full                                                        +---------+---------------+---------+-----------+----------+--------------+ FV Prox  Full                                                        +---------+---------------+---------+-----------+----------+--------------+ FV Mid   Full                                                        +---------+---------------+---------+-----------+----------+--------------+ FV DistalFull                                                        +---------+---------------+---------+-----------+----------+--------------+ PFV      Full                                                        +---------+---------------+---------+-----------+----------+--------------+ POP      Full           Yes      Yes                                 +---------+---------------+---------+-----------+----------+--------------+ PTV                                                   Not visualized +---------+---------------+---------+-----------+----------+--------------+ PERO                                                   Not visualized +---------+---------------+---------+-----------+----------+--------------+   +----+---------------+---------+-----------+----------+--------------+ LEFTCompressibilityPhasicitySpontaneityPropertiesThrombus Aging +----+---------------+---------+-----------+----------+--------------+ CFV Full           Yes      Yes                                 +----+---------------+---------+-----------+----------+--------------+ SFJ Full                                                        +----+---------------+---------+-----------+----------+--------------+    Summary: RIGHT: - There is no evidence of deep vein thrombosis  in the lower extremity. However, portions of this examination were limited- see technologist comments above.  - No cystic structure found in the popliteal fossa.  LEFT: - No evidence of common femoral vein obstruction.  *See table(s) above for measurements and observations.    Preliminary     Procedures Procedures    Medications Ordered in ED Medications  acetaminophen (TYLENOL) tablet 650 mg (650 mg Oral Given 03/31/23 1559)    ED Course/ Medical Decision Making/ A&P                                 Medical Decision Making Patient is a 32 year old female, diabetic, who presents to the ED secondary to right lower extremity swelling, has been going on for last couple days.  We obtained an ultrasound to rule out a DVT, as well as an x-ray, for further evaluation.  She does have good pulse, and no evidence of any neurodeficits.  She is mildly erythematous.  No history of IV drug use  Amount and/or Complexity of Data Reviewed Labs: ordered.    Details: Pregnancy negative Radiology: ordered.    Details: Ultrasound negative for DVT, x-ray unremarkable Discussion of management or test interpretation with external provider(s): Discussed with patient, her ultrasound is negative for DVT, x-rays unremarkable, I will put her on 10 days of doxycycline for  cellulitis.  Discussed return precautions and she voiced understanding.  Return precautions emphasized.  She that she has good pulse, no neurodeficits, and overall exam is reassuring.  His compartments are soft  Risk OTC drugs. Prescription drug management.    Final Clinical Impression(s) / ED Diagnoses Final diagnoses:  Cellulitis of right leg    Rx / DC Orders ED Discharge Orders          Ordered    doxycycline (VIBRAMYCIN) 100 MG capsule  2 times daily        03/31/23 1810              , [place], [place] 03/31/23 1813    Charlynne Pander, MD 03/31/23 2227

## 2023-04-01 DIAGNOSIS — Z5181 Encounter for therapeutic drug level monitoring: Secondary | ICD-10-CM | POA: Diagnosis not present

## 2023-04-02 DIAGNOSIS — N390 Urinary tract infection, site not specified: Secondary | ICD-10-CM | POA: Diagnosis not present

## 2023-04-02 DIAGNOSIS — E1169 Type 2 diabetes mellitus with other specified complication: Secondary | ICD-10-CM | POA: Diagnosis not present

## 2023-04-02 DIAGNOSIS — M79604 Pain in right leg: Secondary | ICD-10-CM | POA: Diagnosis not present

## 2023-04-02 DIAGNOSIS — R310 Gross hematuria: Secondary | ICD-10-CM | POA: Diagnosis not present

## 2023-04-04 ENCOUNTER — Emergency Department (EMERGENCY_DEPARTMENT_HOSPITAL)
Admission: EM | Admit: 2023-04-04 | Discharge: 2023-04-06 | Disposition: A | Discharge status: Home / Self Care | Payer: 59 | Source: Home / Self Care | Attending: Emergency Medicine | Admitting: Emergency Medicine

## 2023-04-04 DIAGNOSIS — Z79899 Other long term (current) drug therapy: Secondary | ICD-10-CM | POA: Insufficient documentation

## 2023-04-04 DIAGNOSIS — R45851 Suicidal ideations: Secondary | ICD-10-CM

## 2023-04-04 DIAGNOSIS — F250 Schizoaffective disorder, bipolar type: Secondary | ICD-10-CM | POA: Insufficient documentation

## 2023-04-04 DIAGNOSIS — F22 Delusional disorders: Secondary | ICD-10-CM | POA: Insufficient documentation

## 2023-04-04 DIAGNOSIS — R45850 Homicidal ideations: Secondary | ICD-10-CM | POA: Insufficient documentation

## 2023-04-04 DIAGNOSIS — E119 Type 2 diabetes mellitus without complications: Secondary | ICD-10-CM | POA: Insufficient documentation

## 2023-04-04 DIAGNOSIS — Z7984 Long term (current) use of oral hypoglycemic drugs: Secondary | ICD-10-CM | POA: Insufficient documentation

## 2023-04-04 DIAGNOSIS — E876 Hypokalemia: Secondary | ICD-10-CM | POA: Insufficient documentation

## 2023-04-04 DIAGNOSIS — F419 Anxiety disorder, unspecified: Secondary | ICD-10-CM | POA: Diagnosis not present

## 2023-04-04 LAB — MAGNESIUM: Magnesium: 2 mg/dL (ref 1.7–2.4)

## 2023-04-04 LAB — URINALYSIS, ROUTINE W REFLEX MICROSCOPIC
Bacteria, UA: NONE SEEN
Bilirubin Urine: NEGATIVE
Glucose, UA: NEGATIVE mg/dL
Ketones, ur: NEGATIVE mg/dL
Leukocytes,Ua: NEGATIVE
Nitrite: NEGATIVE
Protein, ur: NEGATIVE mg/dL
Specific Gravity, Urine: 1.005 (ref 1.005–1.030)
pH: 6 (ref 5.0–8.0)

## 2023-04-04 LAB — CBC WITH DIFFERENTIAL/PLATELET
Abs Immature Granulocytes: 0.02 10*3/uL (ref 0.00–0.07)
Basophils Absolute: 0 10*3/uL (ref 0.0–0.1)
Basophils Relative: 0 %
Eosinophils Absolute: 0 10*3/uL (ref 0.0–0.5)
Eosinophils Relative: 0 %
HCT: 33.7 % — ABNORMAL LOW (ref 36.0–46.0)
Hemoglobin: 10.8 g/dL — ABNORMAL LOW (ref 12.0–15.0)
Immature Granulocytes: 0 %
Lymphocytes Relative: 16 %
Lymphs Abs: 1.2 10*3/uL (ref 0.7–4.0)
MCH: 27.8 pg (ref 26.0–34.0)
MCHC: 32 g/dL (ref 30.0–36.0)
MCV: 86.6 fL (ref 80.0–100.0)
Monocytes Absolute: 0.4 10*3/uL (ref 0.1–1.0)
Monocytes Relative: 6 %
Neutro Abs: 5.5 10*3/uL (ref 1.7–7.7)
Neutrophils Relative %: 78 %
Platelets: 247 10*3/uL (ref 150–400)
RBC: 3.89 MIL/uL (ref 3.87–5.11)
RDW: 13.5 % (ref 11.5–15.5)
WBC: 7.1 10*3/uL (ref 4.0–10.5)
nRBC: 0 % (ref 0.0–0.2)

## 2023-04-04 LAB — RAPID URINE DRUG SCREEN, HOSP PERFORMED
Amphetamines: NOT DETECTED
Barbiturates: NOT DETECTED
Benzodiazepines: NOT DETECTED
Cocaine: NOT DETECTED
Opiates: NOT DETECTED
Tetrahydrocannabinol: NOT DETECTED

## 2023-04-04 LAB — COMPREHENSIVE METABOLIC PANEL
ALT: 17 U/L (ref 0–44)
AST: 18 U/L (ref 15–41)
Albumin: 4 g/dL (ref 3.5–5.0)
Alkaline Phosphatase: 93 U/L (ref 38–126)
Anion gap: 10 (ref 5–15)
BUN: 7 mg/dL (ref 6–20)
CO2: 22 mmol/L (ref 22–32)
Calcium: 9 mg/dL (ref 8.9–10.3)
Chloride: 105 mmol/L (ref 98–111)
Creatinine, Ser: 0.79 mg/dL (ref 0.44–1.00)
GFR, Estimated: >60 mL/min (ref >60)
Glucose, Bld: 124 mg/dL — ABNORMAL HIGH (ref 70–99)
Potassium: 2.9 mmol/L — ABNORMAL LOW (ref 3.5–5.1)
Sodium: 137 mmol/L (ref 135–145)
Total Bilirubin: 0.7 mg/dL (ref 0.3–1.2)
Total Protein: 8 g/dL (ref 6.5–8.1)

## 2023-04-04 LAB — ETHANOL: Alcohol, Ethyl (B): <10 mg/dL (ref <10)

## 2023-04-04 LAB — CBG MONITORING, ED
Glucose-Capillary: 105 mg/dL — ABNORMAL HIGH (ref 70–99)
Glucose-Capillary: 99 mg/dL (ref 70–99)

## 2023-04-04 LAB — PREGNANCY, URINE: Preg Test, Ur: NEGATIVE

## 2023-04-04 MED ORDER — DOXYCYCLINE HYCLATE 100 MG PO TABS
100.0000 mg | ORAL_TABLET | Freq: Two times a day (BID) | ORAL | Status: DC
  Administered 2023-04-04 – 2023-04-05 (×3): 100 mg via ORAL
  Filled 2023-04-04 (×5): qty 1

## 2023-04-04 MED ORDER — CELECOXIB 200 MG PO CAPS
200.0000 mg | ORAL_CAPSULE | Freq: Two times a day (BID) | ORAL | Status: DC
  Administered 2023-04-05: 200 mg via ORAL
  Filled 2023-04-04 (×5): qty 1

## 2023-04-04 MED ORDER — POLYETHYLENE GLYCOL 3350 17 GM/SCOOP PO POWD: 17.0000 g | Freq: Every day | ORAL | Status: DC | PRN

## 2023-04-04 MED ORDER — HYDROXYZINE HCL 25 MG PO TABS
50.0000 mg | ORAL_TABLET | Freq: Two times a day (BID) | ORAL | Status: DC | PRN
  Administered 2023-04-05: 50 mg via ORAL
  Filled 2023-04-04: qty 2

## 2023-04-04 MED ORDER — METFORMIN HCL 500 MG PO TABS
500.0000 mg | ORAL_TABLET | Freq: Every day | ORAL | Status: DC
  Administered 2023-04-05: 500 mg via ORAL
  Filled 2023-04-04: qty 1

## 2023-04-04 MED ORDER — OLANZAPINE 5 MG PO TBDP
15.0000 mg | ORAL_TABLET | Freq: Every day | ORAL | Status: DC
  Administered 2023-04-04: 15 mg via ORAL
  Filled 2023-04-04: qty 1

## 2023-04-04 MED ORDER — POTASSIUM CHLORIDE 20 MEQ PO PACK
40.0000 meq | PACK | Freq: Two times a day (BID) | ORAL | Status: DC
  Administered 2023-04-04 – 2023-04-05 (×4): 40 meq via ORAL
  Filled 2023-04-04 (×4): qty 2

## 2023-04-04 MED ORDER — LAMOTRIGINE 100 MG PO TABS
100.0000 mg | ORAL_TABLET | Freq: Every day | ORAL | Status: DC
  Administered 2023-04-04: 100 mg via ORAL
  Filled 2023-04-04: qty 1

## 2023-04-04 MED ORDER — LAMOTRIGINE 25 MG PO TABS
25.0000 mg | ORAL_TABLET | Freq: Two times a day (BID) | ORAL | Status: DC
  Administered 2023-04-04: 25 mg via ORAL
  Filled 2023-04-04: qty 1

## 2023-04-04 MED ORDER — POTASSIUM CHLORIDE CRYS ER 20 MEQ PO TBCR
40.0000 meq | EXTENDED_RELEASE_TABLET | Freq: Once | ORAL | Status: DC
  Filled 2023-04-04: qty 2

## 2023-04-04 MED ORDER — ATORVASTATIN CALCIUM 40 MG PO TABS
40.0000 mg | ORAL_TABLET | Freq: Every evening | ORAL | Status: DC
  Administered 2023-04-04 – 2023-04-05 (×2): 40 mg via ORAL
  Filled 2023-04-04 (×2): qty 1

## 2023-04-04 MED ORDER — DOCUSATE SODIUM 100 MG PO CAPS: 100.0000 mg | ORAL_CAPSULE | Freq: Every day | ORAL | Status: DC | PRN

## 2023-04-04 MED ORDER — ACETAMINOPHEN 325 MG PO TABS
650.0000 mg | ORAL_TABLET | Freq: Four times a day (QID) | ORAL | Status: DC | PRN
  Administered 2023-04-04 – 2023-04-05 (×3): 650 mg via ORAL
  Filled 2023-04-04 (×3): qty 2

## 2023-04-04 MED ORDER — GABAPENTIN 300 MG PO CAPS
300.0000 mg | ORAL_CAPSULE | Freq: Every evening | ORAL | Status: DC
  Administered 2023-04-04 – 2023-04-05 (×2): 300 mg via ORAL
  Filled 2023-04-04 (×2): qty 1

## 2023-04-04 NOTE — ED Triage Notes (Signed)
Pt reports recent feelings of anxiety, depression and anger towards mother. Endorses suicidal thoughts as well as homicidal thoughts toward mother. Pt endorses plan to jump off of a building. Hearing voices of her deceased cousin. Denies visual hallucinations.

## 2023-04-04 NOTE — ED Notes (Signed)
Lunch Tray given to pt.

## 2023-04-04 NOTE — Consult Note (Signed)
BH ED ASSESSMENT   Reason for Consult:  Psychiatry evaluation note Referring Physician:  ER Physician Patient Identification: Cindy Phillips MRN:  [id number] ED Chief Complaint: Schizoaffective disorder, bipolar type (HCC)  Diagnosis:  Principal Problem:   Schizoaffective disorder, bipolar type [facility])   ED Assessment Time Calculation: Start Time: 1331 Stop Time: 1402 Total Time in Minutes (Assessment Completion): 31   Subjective:   ANALUISA Phillips is a 32 y.o. female patient admitted with previous hx .of  Intellectual disability, Depression, Schizoaffective disorder, Bipolar type, anxiety brought in for suiicidal ideation with plan to jump off the roof of a building.  HPI:  Patient was seen by this provider and she engaged in meaningful conversation.  Patient reports she had an argument with her mother.  It got so bad she threatened her  She also became suicidal with a plan to jump off the roof of a building.  Patient is taking her Psychotropic Medications but believes they are not effective.  Patient also states that she looses control of her anger.  She added she cannot control her anger and that is the reason she is here.  Patient sees a Therapist, sports at [employer] who prescribes her medications.  She knows all of her Medications but believes her mood need to be managed better.  She was hospitalized at  [facility] 2016 and that was when she was diagnosed.  According to patient she has not been hospitalized again.  Her Lamictal was increased to 100 mg last week.  Patient is also taking Olanzapine.  Patient reports poor sleep but good appetite.  She dies HI at this time but hears the voice of her deceased Cousin all the time. Patient is unemployed and lives with her mother. Collateral information from mother is that patient is taking her Medications as prescribed but is unable to manage her anger.  This episode is because Mother brought her boyfriend to the house.  Patient became very angry and  they started arguing.  Patient does not want Mother's boyfriend in the house. AA female, 56 years old, obese brought to the ER after altercation with her mother.  She endorsed suicide and with plan to jump off a building and also threatened to do something to her mother.  We will resume her medications and and seek inpatient Psychiatry hospitalization for her safety and safety of people around her.  Past Psychiatric History: previous hx .of  Intelectual Disability, Depression, Schizoaffective disorder, Bipolar type, anxiety.  Last known hospitalization was 2016 at  [facility].  [facility] and also sees a counselor as well.  Risk to Self or Others: Is the patient at risk to self? Yes Has the patient been a risk to self in the past 6 months? Yes Has the patient been a risk to self within the distant past? Yes Is the patient a risk to others? Yes Has the patient been a risk to others in the past 6 months? Yes Has the patient been a risk to others within the distant past? Yes  [place] Scale:  Flowsheet Row ED from 04/04/2023 in [facility] Emergency Department at [facility] ED from 03/31/2023 in [facility] Emergency Department at [facility] Pre-Admission Testing 60 from 05/02/2022 in [facility]  C-SSRS RISK CATEGORY High Risk No Risk No Risk       AIMS:  , , ,  ,   ASAM:    Substance Abuse:  Past Medical History:  Past Medical History:  Diagnosis Date   Arthritis    Bipolar 1 disorder (HCC)    Bowel incontinence    Depression    Diabetes mellitus without complication (HCC)    Headache    migraines   Mental disability    Sleep apnea     Past Surgical History:  Procedure Laterality Date   ANTERIOR CRUCIATE LIGAMENT REPAIR Right 05/11/2022   Procedure: RIGHT KNEE ARTHROSCOPY; LATERAL MENISECTOMY; ANTERIOR CRUCIATE LIGAMENT (ACL) REPAIR; CHONDROPLASTY;  Surgeon: Frederico Hamman, MD;   Location: [facility];  Service: Orthopedics;  Laterality: Right;   FOOT SURGERY     KNEE ARTHROSCOPY W/ ACL RECONSTRUCTION Left 10/12/2020   TOOTH EXTRACTION     Family History:  Family History  Problem Relation Age of Onset   High blood pressure Mother    Anxiety disorder Mother    Diabetes Maternal Grandmother    Family Psychiatric  History: denies Social History:  Social History   Substance and Sexual Activity  Alcohol Use Not Currently     Social History   Substance and Sexual Activity  Drug Use No    Social History   Socioeconomic History   Marital status: Single    Spouse name: Not on file   Number of children: 0   Years of education: Not on file   Highest education level: Not on file  Occupational History   Occupation: Nature conservation officer     Comment: [employer]   Tobacco Use   Smoking status: Never   Smokeless tobacco: Never  Vaping Use   Vaping status: Never Used  Substance and Sexual Activity   Alcohol use: Not Currently   Drug use: No   Sexual activity: Not Currently  Other Topics Concern   Not on file  Social History Narrative   Not on file   Social Determinants of Health   Financial Resource Strain: Not on file  Food Insecurity: No Food Insecurity (12/15/2021)   Hunger Vital Sign    Worried About Running Out of Food in the Last Year: Never true    Ran Out of Food in the Last Year: Never true  Transportation Needs: No Transportation Needs (12/15/2021)   PRAPARE - Administrator, Civil Service (Medical): No    Lack of Transportation (Non-Medical): No  Physical Activity: Not on file  Stress: Not on file  Social Connections: Not on file   Additional Social History:    Allergies:  No Known Allergies  Labs:  Results for orders placed or performed during the hospital encounter of 04/04/23 (from the past 48 hour(s))  Comprehensive metabolic panel     Status: Abnormal   Collection Time: 04/04/23  8:55 AM  Result Value Ref Range   Sodium 137 135 -  145 mmol/L   Potassium 2.9 (L) 3.5 - 5.1 mmol/L   Chloride 105 98 - 111 mmol/L   CO2 22 22 - 32 mmol/L   Glucose, Bld 124 (H) 70 - 99 mg/dL    Comment: Glucose reference range applies only to samples taken after fasting for at least 8 hours.   BUN 7 6 - 20 mg/dL   Creatinine, Ser 1.91 0.44 - 1.00 mg/dL   Calcium 9.0 8.9 - 47.8 mg/dL   Total Protein 8.0 6.5 - 8.1 g/dL   Albumin 4.0 3.5 - 5.0 g/dL   AST 18 15 - 41 U/L   ALT 17 0 - 44 U/L   Alkaline Phosphatase 93 38 - 126  U/L   Total Bilirubin 0.7 0.3 - 1.2 mg/dL   GFR, Estimated >78 >29 mL/min    Comment: (NOTE) Calculated using the CKD-EPI Creatinine Equation (2021)    Anion gap 10 5 - 15    Comment: Performed at [facility], [address], [place], [place] [address]  Ethanol     Status: None   Collection Time: 04/04/23  8:55 AM  Result Value Ref Range   Alcohol, Ethyl (B) <10 <10 mg/dL    Comment: (NOTE) Lowest detectable limit for serum alcohol is 10 mg/dL.  For medical purposes only. Performed at [facility], [address], [place], [place] [address]   CBC with Diff     Status: Abnormal   Collection Time: 04/04/23  8:55 AM  Result Value Ref Range   WBC 7.1 4.0 - 10.5 K/uL   RBC 3.89 3.87 - 5.11 MIL/uL   Hemoglobin 10.8 (L) 12.0 - 15.0 g/dL   HCT 84.6 (L) 96.2 - 95.2 %   MCV 86.6 80.0 - 100.0 fL   MCH 27.8 26.0 - 34.0 pg   MCHC 32.0 30.0 - 36.0 g/dL   RDW 84.1 32.4 - 40.1 %   Platelets 247 150 - 400 K/uL   nRBC 0.0 0.0 - 0.2 %   Neutrophils Relative % 78 %   Neutro Abs 5.5 1.7 - 7.7 K/uL   Lymphocytes Relative 16 %   Lymphs Abs 1.2 0.7 - 4.0 K/uL   Monocytes Relative 6 %   Monocytes Absolute 0.4 0.1 - 1.0 K/uL   Eosinophils Relative 0 %   Eosinophils Absolute 0.0 0.0 - 0.5 K/uL   Basophils Relative 0 %   Basophils Absolute 0.0 0.0 - 0.1 K/uL   Immature Granulocytes 0 %   Abs Immature Granulocytes 0.02 0.00 - 0.07 K/uL    Comment: Performed at [facility], [address], [place], [place] [address]  Magnesium     Status: None   Collection Time: 04/04/23  8:55 AM  Result Value Ref Range   Magnesium 2.0 1.7 - 2.4 mg/dL    Comment: Performed at [facility], [address], [place], [place] [address]  Urine rapid drug screen (hosp performed)     Status: None   Collection Time: 04/04/23  9:45 AM  Result Value Ref Range   Opiates NONE DETECTED NONE DETECTED   Cocaine NONE DETECTED NONE DETECTED   Benzodiazepines NONE DETECTED NONE DETECTED   Amphetamines NONE DETECTED NONE DETECTED   Tetrahydrocannabinol NONE DETECTED NONE DETECTED   Barbiturates NONE DETECTED NONE DETECTED    Comment: (NOTE) DRUG SCREEN FOR MEDICAL PURPOSES ONLY.  IF CONFIRMATION IS NEEDED FOR ANY PURPOSE, NOTIFY LAB WITHIN 5 DAYS.  LOWEST DETECTABLE LIMITS FOR URINE DRUG SCREEN Drug Class                     Cutoff (ng/mL) Amphetamine and metabolites    1000 Barbiturate and metabolites    200 Benzodiazepine                 200 Opiates and metabolites        300 Cocaine and metabolites        300 THC                            50 Performed at [facility], [address] [place], [place] [address]  Urinalysis, Routine w reflex microscopic -Urine, Clean Catch     Status: Abnormal   Collection Time: 04/04/23  9:45 AM  Result Value Ref Range   Color, Urine YELLOW YELLOW   APPearance CLEAR CLEAR   Specific Gravity, Urine 1.005 1.005 - 1.030   pH 6.0 5.0 - 8.0   Glucose, UA NEGATIVE NEGATIVE mg/dL   Hgb urine dipstick LARGE (A) NEGATIVE   Bilirubin Urine NEGATIVE NEGATIVE   Ketones, ur NEGATIVE NEGATIVE mg/dL   Protein, ur NEGATIVE NEGATIVE mg/dL   Nitrite NEGATIVE NEGATIVE   Leukocytes,Ua NEGATIVE NEGATIVE   RBC / HPF 0-5 0 - 5 RBC/hpf   WBC, UA 0-5 0 - 5 WBC/hpf   Bacteria, UA NONE SEEN NONE SEEN   Squamous Epithelial / HPF 0-5 0 - 5 /HPF    Comment: Performed at [facility], [address] [place], [place] [address]  Pregnancy, urine     Status: None   Collection Time: 04/04/23  9:45 AM  Result Value Ref Range   Preg Test, Ur NEGATIVE NEGATIVE    Comment:        THE SENSITIVITY OF THIS METHODOLOGY IS >25 mIU/mL. Performed at [facility], [address] [place], [place] [address]     Current Facility-Administered Medications  Medication Dose Route Frequency Provider Last Rate Last Admin   acetaminophen (TYLENOL) tablet 650 mg  650 mg Oral Q6H PRN Jacalyn Lefevre, MD       atorvastatin (LIPITOR) tablet 40 mg  40 mg Oral QPM Jacalyn Lefevre, MD       celecoxib (CELEBREX) capsule 200 mg  200 mg Oral BID Jacalyn Lefevre, MD       docusate sodium (COLACE) capsule 100 mg  100 mg Oral Daily PRN Jacalyn Lefevre, MD       gabapentin (NEURONTIN) capsule 300 mg  300 mg Oral QPM Jacalyn Lefevre, MD       hydrOXYzine (ATARAX) tablet 50 mg  50 mg Oral BID PRN Jacalyn Lefevre, MD       lamoTRIgine (LAMICTAL) tablet 25 mg  25 mg Oral BID Jacalyn Lefevre, MD   25 mg at 04/04/23 1427   [START ON 04/05/2023] metFORMIN (GLUCOPHAGE) tablet 500 mg  500 mg Oral Q breakfast Jacalyn Lefevre, MD       OLANZapine zydis (ZYPREXA) disintegrating tablet 15 mg  15 mg Oral QHS Jacalyn Lefevre, MD       polyethylene glycol powder (GLYCOLAX/MIRALAX) container 17 g  17 g Oral Daily PRN Jacalyn Lefevre, MD       potassium chloride (KLOR-CON) packet 40 mEq  40 mEq Oral BID Jacalyn Lefevre, MD   40 mEq at 04/04/23 1006   potassium chloride SA (KLOR-CON M) CR tablet 40 mEq  40 mEq Oral Once Jacalyn Lefevre, MD       Current Outpatient Medications  Medication Sig Dispense Refill   acetaminophen (TYLENOL) 325 MG tablet Take 650 mg by mouth every 6 (six) hours as needed for headache or moderate pain.     atorvastatin (LIPITOR) 40 MG tablet Take 40 mg by mouth every evening.     celecoxib (CELEBREX) 200 MG capsule Take 1 capsule (200 mg total) by mouth 2 (two) times daily. 60 capsule 0    cyclobenzaprine (FLEXERIL) 10 MG tablet Take 1 tablet (10 mg total) by mouth 3 (three) times daily as needed for muscle spasms. 30 tablet 0   docusate sodium (COLACE) 100 MG capsule Take 1 capsule (100 mg total)  by mouth daily as needed. 30 capsule 2   doxycycline (VIBRAMYCIN) 100 MG capsule Take 1 capsule (100 mg total) by mouth 2 (two) times daily. 20 capsule 0   gabapentin (NEURONTIN) 300 MG capsule Take 300 mg by mouth every evening.     hydrOXYzine (ATARAX/VISTARIL) 50 MG tablet Take 1 tablet (50 mg total) by mouth every 4 (four) hours as needed for anxiety. (Patient taking differently: Take 50 mg by mouth in the morning and at bedtime.) 45 tablet 0   lamoTRIgine (LAMICTAL) 25 MG tablet Take 1 tablet (25 mg total) by mouth daily. For mood stabilization (Patient taking differently: Take 25 mg by mouth 2 (two) times daily. For mood stabilization) 30 tablet 0   medroxyPROGESTERone (DEPO-PROVERA) 150 MG/ML injection Inject 1 mL (150 mg total) into the muscle every 3 (three) months. For prevention of pregnancy 1 mL    metFORMIN (GLUCOPHAGE) 500 MG tablet Take one tablet by mouth in the morning and one tablet by mouth in the evening (Patient taking differently: Take 500 mg by mouth daily with breakfast.) 60 tablet 0   OLANZapine zydis (ZYPREXA) 15 MG disintegrating tablet Take 1 tablet (15 mg total) by mouth at bedtime. For mood control 30 tablet 0   oxyCODONE (OXY IR/ROXICODONE) 5 MG immediate release tablet Take one tab po q4-6hrs prn pain 40 tablet 0   polyethylene glycol powder (GLYCOLAX/MIRALAX) 17 GM/SCOOP powder Take 17 g by mouth daily as needed for moderate constipation.      Musculoskeletal: Strength & Muscle Tone: within normal limits Gait & Station: normal Patient leans: Front   Psychiatric Specialty Exam: Presentation  General Appearance:  Casual; Neat  Eye Contact: Fleeting  Speech: Clear and Coherent; Normal Rate  Speech Volume: Normal  Handedness: Right   Mood  and Affect  Mood: Angry; Irritable  Affect: Depressed   Thought Process  Thought Processes: Coherent; Goal Directed  Descriptions of Associations:Intact  Orientation:Full (Time, Place and Person)  Thought Content:Logical  History of Schizophrenia/Schizoaffective disorder:No data recorded Duration of Psychotic Symptoms:No data recorded Hallucinations:Hallucinations: Auditory Description of Auditory Hallucinations: Hears the voice of deceased Cousin who passed away two years ago  Ideas of Reference:No data recorded Suicidal Thoughts:Suicidal Thoughts: Yes, Active SI Active Intent and/or Plan: With Intent; With Plan; With Means to Carry Out; With Access to Means (plans to jump off roof)  Homicidal Thoughts:Homicidal Thoughts: No   Sensorium  Memory: Immediate Good; Recent Good; Remote Good  Judgment: Intact  Insight: Present   Executive Functions  Concentration: Good  Attention Span: Good  Recall: Good  Fund of Knowledge: Fair  Language: Good   Psychomotor Activity  Psychomotor Activity: Psychomotor Activity: Normal   Assets  Assets: Housing; Desire for Improvement; Communication Skills; Social Support    Sleep  Sleep: Sleep: Fair   Physical Exam: Physical Exam Vitals and nursing note reviewed.  Constitutional:      Appearance: She is obese.  HENT:     Head: Normocephalic.     Nose: Nose normal.  Cardiovascular:     Rate and Rhythm: Tachycardia present.  Pulmonary:     Effort: Pulmonary effort is normal.  Musculoskeletal:        General: Normal range of motion.     Cervical back: Normal range of motion.  Skin:    General: Skin is dry.  Neurological:     Mental Status: She is alert and oriented to person, place, and time.  Psychiatric:        Attention and Perception:  Attention normal. She perceives auditory hallucinations.        Mood and Affect: Mood is anxious and depressed. Affect is angry.        Speech: Speech normal.         Behavior: Behavior is cooperative.        Thought Content: Thought content includes homicidal and suicidal ideation. Thought content includes suicidal plan.        Cognition and Memory: Cognition and memory normal.        Judgment: Judgment is impulsive and inappropriate.    Review of Systems  Constitutional: Negative.   HENT: Negative.    Eyes: Negative.   Respiratory: Negative.    Cardiovascular: Negative.   Gastrointestinal: Negative.   Genitourinary: Negative.   Musculoskeletal: Negative.   Skin: Negative.   Neurological: Negative.   Endo/Heme/Allergies: Negative.   Psychiatric/Behavioral:  Positive for depression, hallucinations and suicidal ideas. The patient is nervous/anxious.    Blood pressure (!) 152/109, pulse (!) 114, temperature 98.4 F (36.9 C), temperature source Oral, resp. rate 18, SpO2 99%. There is no height or weight on file to calculate BMI.  Medical Decision Making: Patient remains suicidal with plan.  We have resumed home Medications.  We will seek inpatient Psychiatry hospitalization.  We will fax records out for to facilities with available bed.  Problem 1: Schizoaffective Disorder, Bipolar type  Problem 2: Suicide ideation  Problem 3: Homicide ideation.  Disposition:  Admit seek bed placement.  Earney Navy, NP-PMHNP-BC 04/04/2023 3:02 PM

## 2023-04-04 NOTE — Progress Notes (Signed)
LCSW Progress Note  [id number]   Cindy Phillips  04/04/2023  3:24 PM  Description:   Inpatient Psychiatric Referral  Patient was recommended inpatient per Dahlia Byes, NP. There are no available beds at [facility], per [facility] Rosey Bath, RN. Patient was referred to the following out of network facilities:   Destination  Service Provider Address Phone Fax  [facility]  [address], [place] [address] [phone]  [facility] Patient Placement  [facility], [place] [phone]  [facility]  [address] [place] [address] [phone]  [facility] [address]  [address], [place] [address] [phone]  [facility]  [address] [place], [place] [address] [phone]  [facility]  [address] [place] [address] [phone]  [facility]  [address] [place], [place] [address] [phone]  [facility]  [address] [place] [address] [phone]  [facility]  [address] [place] [address] [phone]  [facility]  [address], [place] [address] [phone]  [facility]  [address], [place] [address] [phone]  [facility]  [address] [place] [address] [phone]  [facility]  [place] [phone]  [facility]  [address] [place] [address] [phone]  Renella Cunas  [place] -- [phone]  [facility]  [address], [place]  [address] [phone]  [facility]  [address] [place] [address] [phone]  [facility]  [address], [place] [address] [phone]  [facility]  [address] [place] [address] [phone]  [facility]  [address] [place] [address] [phone]  [facility]  [address] [place] [address] [phone]  [facility]  [address], [place] [address] [phone]  [facility]  [address] [place] [address] [phone]  [facility]  [address] [place] [address] [phone]    Situation ongoing, CSW to continue following and update chart as more information becomes available.      Cathie Beams, [place]  04/04/2023 3:24 PM

## 2023-04-04 NOTE — ED Notes (Signed)
Moved belongings from locker 34 to 9-12, hall B cabinet.

## 2023-04-04 NOTE — ED Notes (Signed)
Karle Plumber from [facility] called and stated that she will recommend patient to their provider

## 2023-04-04 NOTE — ED Provider Notes (Signed)
[place] EMERGENCY DEPARTMENT AT [facility] Provider Note   CSN: [id number] Arrival date & time: 04/04/23  0865     History  Chief Complaint  Patient presents with   Suicidal   Homicidal    Cindy Phillips is a 32 y.o. female.  Pt is a 32 yo female with pmhx significant for depression, bipolar 1 d/o, dm, sleep apnea, and arthritis.  Pt presents to the ED today with SI.  She has a plan of jumping off a building.  She is homicidal towards her mother.  She is hearing voices of her deceased cousin.  Pt said she's been compliant with her medication.         Home Medications Prior to Admission medications   Medication Sig Start Date End Date Taking? Authorizing Provider  acetaminophen (TYLENOL) 325 MG tablet Take 650 mg by mouth every 6 (six) hours as needed for headache or moderate pain.    [provider]  atorvastatin (LIPITOR) 40 MG tablet Take 40 mg by mouth every evening.    [provider]  celecoxib (CELEBREX) 200 MG capsule Take 1 capsule (200 mg total) by mouth 2 (two) times daily. 05/11/22 05/11/23  Chadwell, Ivin Booty, PA-C  cyclobenzaprine (FLEXERIL) 10 MG tablet Take 1 tablet (10 mg total) by mouth 3 (three) times daily as needed for muscle spasms. 11/13/21   Kerrin Champagne, MD  docusate sodium (COLACE) 100 MG capsule Take 1 capsule (100 mg total) by mouth daily as needed. 05/11/22 05/11/23  Chadwell, Ivin Booty, PA-C  doxycycline (VIBRAMYCIN) 100 MG capsule Take 1 capsule (100 mg total) by mouth 2 (two) times daily. 03/31/23   Small, Brooke L, PA  gabapentin (NEURONTIN) 300 MG capsule Take 300 mg by mouth every evening. 04/17/22   [provider]  hydrOXYzine (ATARAX/VISTARIL) 50 MG tablet Take 1 tablet (50 mg total) by mouth every 4 (four) hours as needed for anxiety. Patient taking differently: Take 50 mg by mouth in the morning and at bedtime. 09/14/14   Armandina Stammer I, NP  lamoTRIgine (LAMICTAL) 25 MG tablet Take 1 tablet (25 mg total) by  mouth daily. For mood stabilization Patient taking differently: Take 25 mg by mouth 2 (two) times daily. For mood stabilization 09/14/14   Armandina Stammer I, NP  medroxyPROGESTERone (DEPO-PROVERA) 150 MG/ML injection Inject 1 mL (150 mg total) into the muscle every 3 (three) months. For prevention of pregnancy 09/14/14   Armandina Stammer I, NP  metFORMIN (GLUCOPHAGE) 500 MG tablet Take one tablet by mouth in the morning and one tablet by mouth in the evening Patient taking differently: Take 500 mg by mouth daily with breakfast. 03/21/20   Quillian Quince D, MD  OLANZapine zydis (ZYPREXA) 15 MG disintegrating tablet Take 1 tablet (15 mg total) by mouth at bedtime. For mood control 09/14/14   Armandina Stammer I, NP  oxyCODONE (OXY IR/ROXICODONE) 5 MG immediate release tablet Take one tab po q4-6hrs prn pain 05/11/22   Chadwell, Ivin Booty, PA-C  polyethylene glycol powder (GLYCOLAX/MIRALAX) 17 GM/SCOOP powder Take 17 g by mouth daily as needed for moderate constipation.    [provider]      Allergies    Patient has no known allergies.    Review of Systems   Review of Systems  Psychiatric/Behavioral:  Positive for dysphoric mood and suicidal ideas. The patient is nervous/anxious.   All other systems reviewed and are negative.   Physical Exam Updated Vital Signs BP (!) 152/109 (BP Location: Right Arm)  Pulse (!) 114   Temp 98.4 F (36.9 C) (Oral)   Resp 18   SpO2 99%  Physical Exam Vitals and nursing note reviewed.  Constitutional:      Appearance: Normal appearance. She is obese.  HENT:     Head: Normocephalic and atraumatic.     Right Ear: External ear normal.     Left Ear: External ear normal.     Nose: Nose normal.     Mouth/Throat:     Mouth: Mucous membranes are moist.     Pharynx: Oropharynx is clear.  Eyes:     Extraocular Movements: Extraocular movements intact.     Conjunctiva/sclera: Conjunctivae normal.     Pupils: Pupils are equal, round, and reactive to light.   Cardiovascular:     Rate and Rhythm: Normal rate and regular rhythm.     Pulses: Normal pulses.     Heart sounds: Normal heart sounds.  Pulmonary:     Effort: Pulmonary effort is normal.     Breath sounds: Normal breath sounds.  Abdominal:     General: Abdomen is flat. Bowel sounds are normal.     Palpations: Abdomen is soft.  Musculoskeletal:        General: Normal range of motion.     Cervical back: Normal range of motion and neck supple.  Skin:    General: Skin is warm.     Capillary Refill: Capillary refill takes less than 2 seconds.  Neurological:     General: No focal deficit present.     Mental Status: She is alert and oriented to person, place, and time.  Psychiatric:        Behavior: Behavior is cooperative.        Thought Content: Thought content is delusional. Thought content includes homicidal and suicidal ideation. Thought content includes suicidal plan.     ED Results / Procedures / Treatments   Labs (all labs ordered are listed, but only abnormal results are displayed) Labs Reviewed  COMPREHENSIVE METABOLIC PANEL - Abnormal; Notable for the following components:      Result Value   Potassium 2.9 (*)    Glucose, Bld 124 (*)    All other components within normal limits  CBC WITH DIFFERENTIAL/PLATELET - Abnormal; Notable for the following components:   Hemoglobin 10.8 (*)    HCT 33.7 (*)    All other components within normal limits  URINALYSIS, ROUTINE W REFLEX MICROSCOPIC - Abnormal; Notable for the following components:   Hgb urine dipstick LARGE (*)    All other components within normal limits  ETHANOL  RAPID URINE DRUG SCREEN, HOSP PERFORMED  PREGNANCY, URINE  MAGNESIUM  CBG MONITORING, ED    EKG None  Radiology No results found.  Procedures Procedures    Medications Ordered in ED Medications  potassium chloride SA (KLOR-CON M) CR tablet 40 mEq (40 mEq Oral Patient Refused/Not Given 04/04/23 1006)  potassium chloride (KLOR-CON) packet 40  mEq (40 mEq Oral Given 04/04/23 1006)  acetaminophen (TYLENOL) tablet 650 mg (has no administration in time range)  atorvastatin (LIPITOR) tablet 40 mg (has no administration in time range)  celecoxib (CELEBREX) capsule 200 mg (has no administration in time range)  docusate sodium (COLACE) capsule 100 mg (has no administration in time range)  gabapentin (NEURONTIN) capsule 300 mg (has no administration in time range)  hydrOXYzine (ATARAX) tablet 50 mg (has no administration in time range)  lamoTRIgine (LAMICTAL) tablet 25 mg (has no administration in time range)  metFORMIN (GLUCOPHAGE) tablet  500 mg (has no administration in time range)  OLANZapine zydis (ZYPREXA) disintegrating tablet 15 mg (has no administration in time range)  polyethylene glycol powder (GLYCOLAX/MIRALAX) container 17 g (has no administration in time range)    ED Course/ Medical Decision Making/ A&P                                 Medical Decision Making Amount and/or Complexity of Data Reviewed Labs: ordered.  Risk OTC drugs. Prescription drug management.   This patient presents to the ED for concern of si/hi, this involves an extensive number of treatment options, and is a complaint that carries with it a high risk of complications and morbidity.  The differential diagnosis includes psychiatric issue   Co morbidities that complicate the patient evaluation  depression, bipolar 1 d/o, dm, sleep apnea, and arthritis   Additional history obtained:  Additional history obtained from epic chart review  Lab Tests:  I Ordered, and personally interpreted labs.  The pertinent results include:  cbc nl other than hgb low at 10.8 (chronic); cmp with k low at 2.9  Medicines ordered and prescription drug management:  I ordered medication including kdur  for hypokalemia  Reevaluation of the patient after these medicines showed that the patient improved I have reviewed the patients home medicines and have made  adjustments as needed   Critical Interventions:  Tts consult   Consultations Obtained:  I requested consultation with TTS,  and discussed lab and imaging findings as well as pertinent plan - consult pending   Problem List / ED Course:  SI/HI:  TTS consult Hypokalemia:  pt given 40 meq kdur.  Repeat ordered for tomorrow.   Reevaluation:  After the interventions noted above, I reevaluated the patient and found that they have :improved   Social Determinants of Health:  Lives at home   Dispostion:  After consideration of the diagnostic results and the patients response to treatment, I feel that the patent would benefit from inpatient psych.          Final Clinical Impression(s) / ED Diagnoses Final diagnoses:  Suicidal ideations  Homicidal ideations  Hypokalemia    Rx / DC Orders ED Discharge Orders     None         Jacalyn Lefevre, MD 04/04/23 1416

## 2023-04-04 NOTE — ED Notes (Signed)
Pt refusing PO K+ pills. Requested dissolvable packet K+ instead from MD.

## 2023-04-05 LAB — POTASSIUM: Potassium: 3.5 mmol/L (ref 3.5–5.1)

## 2023-04-05 LAB — CBG MONITORING, ED
Glucose-Capillary: 105 mg/dL — ABNORMAL HIGH (ref 70–99)
Glucose-Capillary: 119 mg/dL — ABNORMAL HIGH (ref 70–99)
Glucose-Capillary: 95 mg/dL (ref 70–99)

## 2023-04-05 MED ORDER — OLANZAPINE 10 MG PO TBDP
10.0000 mg | ORAL_TABLET | Freq: Every day | ORAL | Status: DC
  Administered 2023-04-05: 10 mg via ORAL
  Filled 2023-04-05: qty 1

## 2023-04-05 MED ORDER — LAMOTRIGINE 100 MG PO TABS: 100.0000 mg | ORAL_TABLET | Freq: Every day | ORAL | Status: DC

## 2023-04-05 NOTE — ED Provider Notes (Signed)
Emergency Medicine Observation Re-evaluation Note  Cindy Phillips is a 32 y.o. female, seen on rounds today.  Pt initially presented to the ED for complaints of Suicidal and Homicidal Currently, the patient is awaiting psychiatric placement for suicidal ideation.  Physical Exam  BP (!) 142/89 (BP Location: Right Wrist)   Pulse 90   Temp 97.7 F (36.5 C) (Oral)   Resp 18   SpO2 97%  Physical Exam General: Calm, eating breakfast Cardiac: Regular rate Lungs: No respiratory distress Psych: Calm  ED Course / MDM  EKG:   I have reviewed the labs performed to date as well as medications administered while in observation.  Recent changes in the last 24 hours include on her scheduled medications.  Plan  Current plan is for psychiatric hospitalization.    Laurence Spates, MD 04/05/23 [phone]

## 2023-04-05 NOTE — ED Notes (Signed)
Pt belongings moved from [place] [address] cabinet to [address] in [place]. One bag of belongings noted and pt confirmed she only had one bag with her. Bag is labeled.

## 2023-04-05 NOTE — Progress Notes (Signed)
[facility] ED Progress Note  04/05/2023 11:41 AM Cindy Phillips  MRN:  [id number]   Subjective:  Cindy Phillips is a 32 y.o. female patient admitted with previous hx .of  Intellectual disability, Depression, Schizoaffective disorder, Bipolar type, anxiety brought in for suiicidal ideation with plan to jump off the roof of a building.  Patient was seen on rounds this morning.  She denies Suicide ideation but remains upset at her mother.  She also reports having flash backs about what happened between her and her mother at home that led to her coming to the ER.  She remains upset regarding her mother bringing her boyfriend to the home.  She does not want that to happen and she does not have anywhere else to go live.  Patient is compliant with her Medications but want Olanzapine decreased.  Lamictal is scheduled in the day time and Olanzapine is decreased.  She is aware that Olanzapine makes her add weight and she is on Metformin for DM caused by Olanzapine.  She reports better night sleep but appetite is fair.  Patient denies SI/HI/VH and no mention of paranoia.  We continue seeking bed placement at any facility with available bed. Principal Problem: Schizoaffective disorder, bipolar type (HCC) Diagnosis:  Principal Problem:   Schizoaffective disorder, bipolar type [facility])   ED Assessment Time Calculation: Start Time: 1127 Stop Time: 1146 Total Time in Minutes (Assessment Completion): 19   Past Psychiatric History: see initial Psychiatry evaluation note  [place] Scale:  Flowsheet Row ED from 04/04/2023 in [facility] Emergency Department at [facility] ED from 03/31/2023 in [facility] Emergency Department at [facility] Pre-Admission Testing 60 from [facility]02/2022 [facility]  C-SSRS RISK CATEGORY High Risk No Risk No Risk       Past Medical History:  Past Medical History:  Diagnosis Date   Arthritis    Bipolar 1 disorder (HCC)     Bowel incontinence    Depression    Diabetes mellitus without complication (HCC)    Headache    migraines   Mental disability    Sleep apnea     Past Surgical History:  Procedure Laterality Date   ANTERIOR CRUCIATE LIGAMENT REPAIR Right 05/11/2022   Procedure: RIGHT KNEE ARTHROSCOPY; LATERAL MENISECTOMY; ANTERIOR CRUCIATE LIGAMENT (ACL) REPAIR; CHONDROPLASTY;  Surgeon: Frederico Hamman, MD;  Location: [facility] ORS;  Service: Orthopedics;  Laterality: Right;   FOOT SURGERY     KNEE ARTHROSCOPY W/ ACL RECONSTRUCTION Left 10/12/2020   TOOTH EXTRACTION     Family History:  Family History  Problem Relation Age of Onset   High blood pressure Mother    Anxiety disorder Mother    Diabetes Maternal Grandmother    Family Psychiatric  History:  see initial Psychiatry evaluation note  Social History:  Social History   Substance and Sexual Activity  Alcohol Use Not Currently     Social History   Substance and Sexual Activity  Drug Use No    Social History   Socioeconomic History   Marital status: Single    Spouse name: Not on file   Number of children: 0   Years of education: Not on file   Highest education level: Not on file  Occupational History   Occupation: Nature conservation officer     Comment: [employer]   Tobacco Use   Smoking status: Never   Smokeless tobacco: Never  Vaping Use   Vaping status: Never Used  Substance and Sexual Activity  Alcohol use: Not Currently   Drug use: No   Sexual activity: Not Currently  Other Topics Concern   Not on file  Social History Narrative   Not on file   Social Determinants of Health   Financial Resource Strain: Not on file  Food Insecurity: No Food Insecurity (12/15/2021)   Hunger Vital Sign    Worried About Running Out of Food in the Last Year: Never true    Ran Out of Food in the Last Year: Never true  Transportation Needs: No Transportation Needs (12/15/2021)   PRAPARE - Administrator, Civil Service (Medical): No    Lack of  Transportation (Non-Medical): No  Physical Activity: Not on file  Stress: Not on file  Social Connections: Not on file    Sleep: Good  Appetite:  Fair  Current Medications: Current Facility-Administered Medications  Medication Dose Route Frequency Provider Last Rate Last Admin   acetaminophen (TYLENOL) tablet 650 mg  650 mg Oral Q6H PRN Jacalyn Lefevre, MD   650 mg at 04/05/23 1017   atorvastatin (LIPITOR) tablet 40 mg  40 mg Oral QPM Jacalyn Lefevre, MD   40 mg at 04/04/23 1759   celecoxib (CELEBREX) capsule 200 mg  200 mg Oral BID Jacalyn Lefevre, MD       docusate sodium (COLACE) capsule 100 mg  100 mg Oral Daily PRN Jacalyn Lefevre, MD       doxycycline (VIBRA-TABS) tablet 100 mg  100 mg Oral Q12H Indiya Izquierdo C, NP   100 mg at 04/05/23 0945   gabapentin (NEURONTIN) capsule 300 mg  300 mg Oral QPM Jacalyn Lefevre, MD   300 mg at 04/04/23 1759   hydrOXYzine (ATARAX) tablet 50 mg  50 mg Oral BID PRN Jacalyn Lefevre, MD       [START ON 04/06/2023] lamoTRIgine (LAMICTAL) tablet 100 mg  100 mg Oral Daily Aeon Kessner C, NP       metFORMIN (GLUCOPHAGE) tablet 500 mg  500 mg Oral Q breakfast Jacalyn Lefevre, MD   500 mg at 04/05/23 0759   OLANZapine zydis (ZYPREXA) disintegrating tablet 10 mg  10 mg Oral QHS Laniqua Torrens C, NP       polyethylene glycol powder (GLYCOLAX/MIRALAX) container 17 g  17 g Oral Daily PRN Jacalyn Lefevre, MD       potassium chloride (KLOR-CON) packet 40 mEq  40 mEq Oral BID Jacalyn Lefevre, MD   40 mEq at 04/05/23 0945   potassium chloride SA (KLOR-CON M) CR tablet 40 mEq  40 mEq Oral Once Jacalyn Lefevre, MD       Current Outpatient Medications  Medication Sig Dispense Refill   acetaminophen (TYLENOL) 325 MG tablet Take 650 mg by mouth every 6 (six) hours as needed for headache or moderate pain.     atorvastatin (LIPITOR) 40 MG tablet Take 40 mg by mouth every evening.     celecoxib (CELEBREX) 200 MG capsule Take 1 capsule (200 mg total) by mouth 2  (two) times daily. 60 capsule 0   cyclobenzaprine (FLEXERIL) 10 MG tablet Take 1 tablet (10 mg total) by mouth 3 (three) times daily as needed for muscle spasms. 30 tablet 0   docusate sodium (COLACE) 100 MG capsule Take 1 capsule (100 mg total) by mouth daily as needed. 30 capsule 2   doxycycline (VIBRAMYCIN) 100 MG capsule Take 1 capsule (100 mg total) by mouth 2 (two) times daily. 20 capsule 0   gabapentin (NEURONTIN) 300 MG capsule Take 300 mg by  mouth every evening.     hydrOXYzine (ATARAX/VISTARIL) 50 MG tablet Take 1 tablet (50 mg total) by mouth every 4 (four) hours as needed for anxiety. (Patient taking differently: Take 50 mg by mouth in the morning and at bedtime.) 45 tablet 0   lamoTRIgine (LAMICTAL) 25 MG tablet Take 1 tablet (25 mg total) by mouth daily. For mood stabilization (Patient taking differently: Take 25 mg by mouth 2 (two) times daily. For mood stabilization) 30 tablet 0   medroxyPROGESTERone (DEPO-PROVERA) 150 MG/ML injection Inject 1 mL (150 mg total) into the muscle every 3 (three) months. For prevention of pregnancy 1 mL    metFORMIN (GLUCOPHAGE) 500 MG tablet Take one tablet by mouth in the morning and one tablet by mouth in the evening (Patient taking differently: Take 500 mg by mouth daily with breakfast.) 60 tablet 0   OLANZapine zydis (ZYPREXA) 15 MG disintegrating tablet Take 1 tablet (15 mg total) by mouth at bedtime. For mood control 30 tablet 0   oxyCODONE (OXY IR/ROXICODONE) 5 MG immediate release tablet Take one tab po q4-6hrs prn pain 40 tablet 0   polyethylene glycol powder (GLYCOLAX/MIRALAX) 17 GM/SCOOP powder Take 17 g by mouth daily as needed for moderate constipation.      Lab Results:  Results for orders placed or performed during the hospital encounter of 04/04/23 (from the past 48 hour(s))  Comprehensive metabolic panel     Status: Abnormal   Collection Time: 04/04/23  8:55 AM  Result Value Ref Range   Sodium 137 135 - 145 mmol/L   Potassium 2.9 (L)  3.5 - 5.1 mmol/L   Chloride 105 98 - 111 mmol/L   CO2 22 22 - 32 mmol/L   Glucose, Bld 124 (H) 70 - 99 mg/dL    Comment: Glucose reference range applies only to samples taken after fasting for at least 8 hours.   BUN 7 6 - 20 mg/dL   Creatinine, Ser 1.61 0.44 - 1.00 mg/dL   Calcium 9.0 8.9 - 09.6 mg/dL   Total Protein 8.0 6.5 - 8.1 g/dL   Albumin 4.0 3.5 - 5.0 g/dL   AST 18 15 - 41 U/L   ALT 17 0 - 44 U/L   Alkaline Phosphatase 93 38 - 126 U/L   Total Bilirubin 0.7 0.3 - 1.2 mg/dL   GFR, Estimated >04 >54 mL/min    Comment: (NOTE) Calculated using the CKD-EPI Creatinine Equation (2021)    Anion gap 10 5 - 15    Comment: Performed at [facility], [address] [place], [place] [address]  Ethanol     Status: None   Collection Time: 04/04/23  8:55 AM  Result Value Ref Range   Alcohol, Ethyl (B) <10 <10 mg/dL    Comment: (NOTE) Lowest detectable limit for serum alcohol is 10 mg/dL.  For medical purposes only. Performed at [facility], [address] [place], [place] [address]   CBC with Diff     Status: Abnormal   Collection Time: 04/04/23  8:55 AM  Result Value Ref Range   WBC 7.1 4.0 - 10.5 K/uL   RBC 3.89 3.87 - 5.11 MIL/uL   Hemoglobin 10.8 (L) 12.0 - 15.0 g/dL   HCT 29.5 (L) 62.1 - 30.8 %   MCV 86.6 80.0 - 100.0 fL   MCH 27.8 26.0 - 34.0 pg   MCHC 32.0 30.0 - 36.0 g/dL   RDW 65.7 84.6 - 96.2 %   Platelets 247 150 -  400 K/uL   nRBC 0.0 0.0 - 0.2 %   Neutrophils Relative % 78 %   Neutro Abs 5.5 1.7 - 7.7 K/uL   Lymphocytes Relative 16 %   Lymphs Abs 1.2 0.7 - 4.0 K/uL   Monocytes Relative 6 %   Monocytes Absolute 0.4 0.1 - 1.0 K/uL   Eosinophils Relative 0 %   Eosinophils Absolute 0.0 0.0 - 0.5 K/uL   Basophils Relative 0 %   Basophils Absolute 0.0 0.0 - 0.1 K/uL   Immature Granulocytes 0 %   Abs Immature Granulocytes 0.02 0.00 - 0.07 K/uL    Comment: Performed at [facility], [address],  [place], [place] [address]  Magnesium     Status: None   Collection Time: 04/04/23  8:55 AM  Result Value Ref Range   Magnesium 2.0 1.7 - 2.4 mg/dL    Comment: Performed at [facility], [address], [place], [place] [address]  Urine rapid drug screen (hosp performed)     Status: None   Collection Time: 04/04/23  9:45 AM  Result Value Ref Range   Opiates NONE DETECTED NONE DETECTED   Cocaine NONE DETECTED NONE DETECTED   Benzodiazepines NONE DETECTED NONE DETECTED   Amphetamines NONE DETECTED NONE DETECTED   Tetrahydrocannabinol NONE DETECTED NONE DETECTED   Barbiturates NONE DETECTED NONE DETECTED    Comment: (NOTE) DRUG SCREEN FOR MEDICAL PURPOSES ONLY.  IF CONFIRMATION IS NEEDED FOR ANY PURPOSE, NOTIFY LAB WITHIN 5 DAYS.  LOWEST DETECTABLE LIMITS FOR URINE DRUG SCREEN Drug Class                     Cutoff (ng/mL) Amphetamine and metabolites    1000 Barbiturate and metabolites    200 Benzodiazepine                 200 Opiates and metabolites        300 Cocaine and metabolites        300 THC                            50 Performed at [facility], [address] [place], [place] [address]   Urinalysis, Routine w reflex microscopic -Urine, Clean Catch     Status: Abnormal   Collection Time: 04/04/23  9:45 AM  Result Value Ref Range   Color, Urine YELLOW YELLOW   APPearance CLEAR CLEAR   Specific Gravity, Urine 1.005 1.005 - 1.030   pH 6.0 5.0 - 8.0   Glucose, UA NEGATIVE NEGATIVE mg/dL   Hgb urine dipstick LARGE (A) NEGATIVE   Bilirubin Urine NEGATIVE NEGATIVE   Ketones, ur NEGATIVE NEGATIVE mg/dL   Protein, ur NEGATIVE NEGATIVE mg/dL   Nitrite NEGATIVE NEGATIVE   Leukocytes,Ua NEGATIVE NEGATIVE   RBC / HPF 0-5 0 - 5 RBC/hpf   WBC, UA 0-5 0 - 5 WBC/hpf   Bacteria, UA NONE SEEN NONE SEEN   Squamous Epithelial / HPF 0-5 0 - 5 /HPF    Comment: Performed at [facility], [address] [place], [place] [address]   Pregnancy, urine     Status: None   Collection Time: 04/04/23  9:45 AM  Result Value Ref Range   Preg Test, Ur NEGATIVE NEGATIVE    Comment:        THE SENSITIVITY OF THIS METHODOLOGY IS >25 mIU/mL. Performed at [facility], [address] [place], [place] [address]   POC CBG, ED     Status: Abnormal   Collection Time: 04/04/23  4:40 PM  Result Value Ref Range   Glucose-Capillary 105 (H) 70 - 99 mg/dL    Comment: Glucose reference range applies only to samples taken after fasting for at least 8 hours.  POC CBG, ED     Status: None   Collection Time: 04/04/23  6:33 PM  Result Value Ref Range   Glucose-Capillary 99 70 - 99 mg/dL    Comment: Glucose reference range applies only to samples taken after fasting for at least 8 hours.  POC CBG, ED     Status: Abnormal   Collection Time: 04/05/23  6:23 AM  Result Value Ref Range   Glucose-Capillary 105 (H) 70 - 99 mg/dL    Comment: Glucose reference range applies only to samples taken after fasting for at least 8 hours.  Potassium     Status: None   Collection Time: 04/05/23  6:26 AM  Result Value Ref Range   Potassium 3.5 3.5 - 5.1 mmol/L    Comment: Performed at [facility], [address], [place], [place] [address]  POC CBG, ED     Status: Abnormal   Collection Time: 04/05/23 10:18 AM  Result Value Ref Range   Glucose-Capillary 119 (H) 70 - 99 mg/dL    Comment: Glucose reference range applies only to samples taken after fasting for at least 8 hours.    Blood Alcohol level:  Lab Results  Component Value Date   ETH <10 04/04/2023   ETH <5 09/09/2014    Physical Findings:  CIWA:    COWS:     Musculoskeletal: Strength & Muscle Tone: within normal limits Gait & Station: normal Patient leans: Front  Psychiatric Specialty Exam:  Presentation  General Appearance:  Casual; Neat  Eye Contact: Good  Speech: Clear and Coherent; Normal Rate  Speech  Volume: Normal  Handedness: Right   Mood and Affect  Mood: Anxious  Affect: Congruent   Thought Process  Thought Processes: Coherent; Goal Directed  Descriptions of Associations:Intact  Orientation:Full (Time, Place and Person)  Thought Content:Logical  History of Schizophrenia/Schizoaffective disorder:No data recorded Duration of Psychotic Symptoms:No data recorded Hallucinations:Hallucinations: Auditory Description of Auditory Hallucinations: Hears the voice of deceased cousin  Ideas of Reference:None  Suicidal Thoughts:Suicidal Thoughts: No SI Active Intent and/or Plan: With Intent; With Plan; With Means to Carry Out; With Access to Means (plans to jump off roof)  Homicidal Thoughts:Homicidal Thoughts: No   Sensorium  Memory: Immediate Good; Recent Good; Remote Good  Judgment: Intact  Insight: Present   Executive Functions  Concentration: Good  Attention Span: Good  Recall: Good  Fund of Knowledge: Good  Language: Good   Psychomotor Activity  Psychomotor Activity: Psychomotor Activity: Normal   Assets  Assets: Communication Skills; Desire for Improvement; Housing; Social Support; Physical Health   Sleep  Sleep: Sleep: Good    Physical Exam: Physical Exam Vitals and nursing note reviewed.  Constitutional:      Appearance: She is obese.  HENT:     Head: Normocephalic.     Nose: Nose normal.  Cardiovascular:     Rate and Rhythm: Normal rate and regular rhythm.  Pulmonary:     Effort: Pulmonary effort is normal.  Musculoskeletal:        General: Normal range of motion.     Cervical back: Normal range of motion.  Skin:    General: Skin is dry.  Neurological:     Mental Status: She is alert and oriented to person, place, and time.  Psychiatric:        Attention and Perception: Attention normal. She perceives auditory hallucinations.        Mood and Affect: Mood is anxious and depressed.        Speech: Speech normal.         Behavior: Behavior normal. Behavior is cooperative.        Thought Content: Thought content normal.        Cognition and Memory: Cognition and memory normal.        Judgment: Judgment is inappropriate. Judgment is not impulsive.    Review of Systems  Constitutional: Negative.   HENT: Negative.    Eyes: Negative.   Respiratory: Negative.    Cardiovascular: Negative.   Gastrointestinal: Negative.   Genitourinary: Negative.   Musculoskeletal: Negative.   Skin: Negative.   Neurological: Negative.   Endo/Heme/Allergies: Negative.   Psychiatric/Behavioral:  Positive for depression and hallucinations. The patient is nervous/anxious.    Blood pressure (!) 142/89, pulse 90, temperature 97.7 F (36.5 C), temperature source Oral, resp. rate 18, SpO2 97%. There is no height or weight on file to calculate BMI.  Medical Decision Making: Inpatient Psychiatry hospitalization is needed for safety and stabilization.  We have faxed records out to facilities with available bed.  Patient has resumed her home  medications. Disposition:  Admit seek bed placement.   Earney Navy, NP-PMHNP-BC 04/05/2023, 11:41 AM

## 2023-04-05 NOTE — Progress Notes (Addendum)
Pt was accepted to [facility] TODAY 04/05/2023;Bed Assignment 404-01.  Pt meets inpatient criteria per Earney Navy, NP-PMHNP-BC   Attending Physician will be Dr. Phineas Inches, MD  Report can be called to: -Adult unit: [phone]  Pt can arrive after: Night [facility] will coordniate.  Care Team notified: Earney Navy, NP-PMHNP-BC,Jennifer Whitley,RN, Bethany Hendra,LCSW, Day [facility] [employer] [place], RN, Afternoon [facility] [address], [place] 04/05/2023 @ 6:27 PM

## 2023-04-06 ENCOUNTER — Inpatient Hospital Stay (HOSPITAL_COMMUNITY)
Admission: AD | Admit: 2023-04-06 | Discharge: 2023-04-09 | DRG: 885 | Disposition: A | Discharge status: Home / Self Care | Payer: 59 | Source: Intra-hospital | Attending: Psychiatry | Admitting: Psychiatry

## 2023-04-06 ENCOUNTER — Other Ambulatory Visit: Payer: Self-pay

## 2023-04-06 ENCOUNTER — Encounter (HOSPITAL_COMMUNITY): Payer: Self-pay | Admitting: Nurse Practitioner

## 2023-04-06 DIAGNOSIS — K5900 Constipation, unspecified: Secondary | ICD-10-CM | POA: Diagnosis present

## 2023-04-06 DIAGNOSIS — E876 Hypokalemia: Secondary | ICD-10-CM | POA: Diagnosis present

## 2023-04-06 DIAGNOSIS — E7800 Pure hypercholesterolemia, unspecified: Secondary | ICD-10-CM | POA: Diagnosis present

## 2023-04-06 DIAGNOSIS — E669 Obesity, unspecified: Secondary | ICD-10-CM | POA: Diagnosis present

## 2023-04-06 DIAGNOSIS — R45851 Suicidal ideations: Secondary | ICD-10-CM | POA: Diagnosis present

## 2023-04-06 DIAGNOSIS — F419 Anxiety disorder, unspecified: Secondary | ICD-10-CM | POA: Diagnosis present

## 2023-04-06 DIAGNOSIS — Z79899 Other long term (current) drug therapy: Secondary | ICD-10-CM

## 2023-04-06 DIAGNOSIS — R45850 Homicidal ideations: Secondary | ICD-10-CM | POA: Diagnosis present

## 2023-04-06 DIAGNOSIS — D72819 Decreased white blood cell count, unspecified: Secondary | ICD-10-CM | POA: Diagnosis present

## 2023-04-06 DIAGNOSIS — Z560 Unemployment, unspecified: Secondary | ICD-10-CM

## 2023-04-06 DIAGNOSIS — F70 Mild intellectual disabilities: Secondary | ICD-10-CM | POA: Diagnosis present

## 2023-04-06 DIAGNOSIS — E119 Type 2 diabetes mellitus without complications: Secondary | ICD-10-CM | POA: Diagnosis present

## 2023-04-06 DIAGNOSIS — F250 Schizoaffective disorder, bipolar type: Principal | ICD-10-CM | POA: Diagnosis present

## 2023-04-06 DIAGNOSIS — Z818 Family history of other mental and behavioral disorders: Secondary | ICD-10-CM | POA: Diagnosis not present

## 2023-04-06 DIAGNOSIS — Z6841 Body Mass Index (BMI) 40.0 and over, adult: Secondary | ICD-10-CM

## 2023-04-06 DIAGNOSIS — L03115 Cellulitis of right lower limb: Secondary | ICD-10-CM | POA: Diagnosis present

## 2023-04-06 DIAGNOSIS — Z23 Encounter for immunization: Secondary | ICD-10-CM

## 2023-04-06 DIAGNOSIS — Z833 Family history of diabetes mellitus: Secondary | ICD-10-CM | POA: Diagnosis not present

## 2023-04-06 LAB — CBG MONITORING, ED: Glucose-Capillary: 102 mg/dL — ABNORMAL HIGH (ref 70–99)

## 2023-04-06 MED ORDER — GABAPENTIN 300 MG PO CAPS
300.0000 mg | ORAL_CAPSULE | Freq: Every evening | ORAL | Status: DC
  Administered 2023-04-06 – 2023-04-08 (×3): 300 mg via ORAL
  Filled 2023-04-06 (×5): qty 1

## 2023-04-06 MED ORDER — ATORVASTATIN CALCIUM 40 MG PO TABS
40.0000 mg | ORAL_TABLET | Freq: Every evening | ORAL | Status: DC
  Administered 2023-04-06 – 2023-04-08 (×3): 40 mg via ORAL
  Filled 2023-04-06 (×5): qty 1

## 2023-04-06 MED ORDER — DOCUSATE SODIUM 100 MG PO CAPS: 100.0000 mg | ORAL_CAPSULE | Freq: Every day | ORAL | Status: DC | PRN

## 2023-04-06 MED ORDER — PNEUMOCOCCAL 20-VAL CONJ VACC 0.5 ML IM SUSY
0.5000 mL | PREFILLED_SYRINGE | INTRAMUSCULAR | Status: AC
  Administered 2023-04-07: 0.5 mL via INTRAMUSCULAR
  Filled 2023-04-06: qty 0.5

## 2023-04-06 MED ORDER — DOCUSATE SODIUM 100 MG PO CAPS
100.0000 mg | ORAL_CAPSULE | Freq: Two times a day (BID) | ORAL | Status: AC
  Administered 2023-04-06 – 2023-04-07 (×3): 100 mg via ORAL
  Filled 2023-04-06 (×3): qty 1

## 2023-04-06 MED ORDER — POTASSIUM CHLORIDE 20 MEQ PO PACK
40.0000 meq | PACK | Freq: Two times a day (BID) | ORAL | Status: DC
  Administered 2023-04-06: 40 meq via ORAL
  Filled 2023-04-06 (×6): qty 2

## 2023-04-06 MED ORDER — ACETAMINOPHEN 325 MG PO TABS
650.0000 mg | ORAL_TABLET | Freq: Four times a day (QID) | ORAL | Status: DC | PRN
  Administered 2023-04-07: 650 mg via ORAL
  Filled 2023-04-06: qty 2

## 2023-04-06 MED ORDER — ALUM & MAG HYDROXIDE-SIMETH 200-200-20 MG/5ML PO SUSP: 30.0000 mL | ORAL | Status: DC | PRN

## 2023-04-06 MED ORDER — LAMOTRIGINE 100 MG PO TABS
100.0000 mg | ORAL_TABLET | Freq: Every day | ORAL | Status: DC
  Administered 2023-04-06 – 2023-04-09 (×4): 100 mg via ORAL
  Filled 2023-04-06 (×7): qty 1

## 2023-04-06 MED ORDER — CELECOXIB 100 MG PO CAPS
200.0000 mg | ORAL_CAPSULE | Freq: Two times a day (BID) | ORAL | Status: DC
  Administered 2023-04-06 – 2023-04-09 (×7): 200 mg via ORAL
  Filled 2023-04-06 (×3): qty 2
  Filled 2023-04-06 (×4): qty 1
  Filled 2023-04-06 (×2): qty 2
  Filled 2023-04-06: qty 1
  Filled 2023-04-06 (×4): qty 2

## 2023-04-06 MED ORDER — HYDROXYZINE HCL 50 MG PO TABS: 50.0000 mg | ORAL_TABLET | Freq: Two times a day (BID) | ORAL | Status: DC | PRN

## 2023-04-06 MED ORDER — POLYETHYLENE GLYCOL 3350 17 G PO PACK
17.0000 g | PACK | Freq: Every day | ORAL | Status: DC | PRN
  Administered 2023-04-07: 17 g via ORAL
  Filled 2023-04-06: qty 1

## 2023-04-06 MED ORDER — OLANZAPINE 10 MG PO TBDP
10.0000 mg | ORAL_TABLET | Freq: Every day | ORAL | Status: DC
  Administered 2023-04-06 – 2023-04-08 (×3): 10 mg via ORAL
  Filled 2023-04-06 (×5): qty 1

## 2023-04-06 MED ORDER — MAGNESIUM HYDROXIDE 400 MG/5ML PO SUSP
30.0000 mL | Freq: Every day | ORAL | Status: DC | PRN
  Administered 2023-04-06: 30 mL via ORAL
  Filled 2023-04-06: qty 30

## 2023-04-06 MED ORDER — METFORMIN HCL 500 MG PO TABS
500.0000 mg | ORAL_TABLET | Freq: Every day | ORAL | Status: DC
  Administered 2023-04-07 – 2023-04-09 (×3): 500 mg via ORAL
  Filled 2023-04-06 (×5): qty 1

## 2023-04-06 MED ORDER — DOXYCYCLINE HYCLATE 100 MG PO TABS
100.0000 mg | ORAL_TABLET | Freq: Two times a day (BID) | ORAL | Status: DC
  Administered 2023-04-06 – 2023-04-09 (×7): 100 mg via ORAL
  Filled 2023-04-06 (×12): qty 1

## 2023-04-06 NOTE — Progress Notes (Addendum)
Patient is a 32 year old female who presented voluntarily from [facility] for complaints of SI with a plan to jump off of the roof of a building. Per report, pt was upset with her mother, whom she lives with, for bringing a boyfriend home. When this nurse asked pt about this, pt looked down and stated, "I don't want to talk about it." Pt has a prior hx of ID, DM without complication, Schizoaffective bipolar type, and depression. Pt continues to have thoughts about "jumping off of a roof", but verbally contracts for safety while here in the hospital. Pt denies self harm behavior, but does endorse hitting walls when triggered. When asked about A/VH, pt stated that she sometimes hears and sees her cousin who passed away last year. Pt reported complaints of crying spells, and panic attacks. Pt  denies alcohol use and other illicit substances.Pt presented with a flat affect, depressed mood, calm, cooperative behavior, answered questions logically and coherently during admission interview and assessment. VS monitored and recorded. Skin check performed with MHT. Belongings searched and secured in locker. Admission paperwork completed and signed. Patient was oriented to unit and schedule.Q 15 min checks initiated for safety.

## 2023-04-06 NOTE — Group Note (Signed)
Date:  04/06/2023 Time:  9:49 PM  Group Topic/Focus:  Wrap-Up Group:   The focus of this group is to help patients review their daily goal of treatment and discuss progress on daily workbooks.    Participation Level:  Minimal  Participation Quality:  Appropriate and Sharing  Affect:  Appropriate  Cognitive:  Appropriate  Insight: Appropriate and Limited  Engagement in Group:  Engaged and Limited  Modes of Intervention:  Discussion and Socialization  Additional Comments:  The patient was very limited and didn't say much. It was her first day on the unit. The patient did not have a goal for today and does not have a goal for tomorrow. The patient did mention leg pain; nurse and doctor already aware and informed. The patient currently wasn't in any pain and was just waiting for medication from nurse.    Kennieth Francois 04/06/2023, 9:49 PM

## 2023-04-06 NOTE — H&P (Addendum)
Psychiatric Admission Assessment Adult  Patient Identification: Cindy Phillips MRN:  [id number] Date of Evaluation:  04/06/2023 Chief Complaint:  Schizoaffective disorder, bipolar type (HCC) [F25.0] Principal Diagnosis: Schizoaffective disorder, bipolar type (HCC) Diagnosis:  Principal Problem:   Schizoaffective disorder, bipolar type (HCC)  History of Present Illness:  Cindy Phillips is a 32 yr old female who presented on 8/15 to [facility] with SI with a plan to jump off a building, she was admitted to [facility] on 8/17.  PPHx is significant for Schizoaffective Disorder, Bipolar Type, Anxiety, and Mild Intellectual Disability, and Multiple Psychiatric Hospitalizations (last [facility] 08/2014), and no history of Suicide Attempts or Self Injurious Behavior.  She reports that she does not like being ignored.  She reports that her mom did not want to talk to her and this kept upsetting her.  She reports she kept trying to talk to her mom and she Being ignored so she started hitting the wall and then stormed off and went to her cousins.  She reports that when she got to her cousins she asked for money to get a bus to go to Hospital.  She reports that she did have thoughts of jumping off of a roof and that she had these thoughts for a long time.  She reports she does regret the argument and what happened.  She reports a past psychiatric history significant for depression and anxiety, per chart review also significant for Schizoaffective Disorder, Bipolar Type and Mild Intellectual Disability.  She reports no history of suicide attempts.  She reports no history of self-injurious behavior.  She reports a history of multiple past psychiatric hospitalizations- last [facility] 08/2014.  She reports past medical history significant for diabetes, elevated cholesterol, low white blood cell count, and sleep apnea (does not wear CPAP).  She reports past surgical history significant for left and right ACL repairs and wisdom teeth  removal x4.  She reports no history of head trauma.  She reports no history of seizures.  She reports NKDA.  She reports currently lives at her mother's house with her mother and grandmother.  She reports she is not employed currently.  She reports graduating high school and having special classes.  She reports no college.  She reports no alcohol use.  She reports no tobacco use.  She reports no substance use.  She reports no current legal issues.  She reports no access to firearms.  Discussed continuing her current medications and she was agreeable with this.  When asked if we could contact her mother she gave permission.  She reports she still has some SI, with no plan.  She reports no HI.  She reports her chronic AVH of her cousin and reports that it is a mix of distressing and comforting.  She reports no BM since Wednesday.  Discussed starting Colace and she was agreeable.  She reports no other concerns at present.    Called patient's mother, Cindy Phillips, [phone].  She reports that the patient has been stable for several years since her last hospitalization in 2016.  She reports that the last couple weeks she was just acting different.  She reports that there were 2 visitors to the house.  She reports that after that she began having flashbacks and nightmares.  She reports that the patient was claiming she was being ignored because 1 of these persons required assistance.  She reports that this person has now left the house.  She reports that the patient did send her text  after storming out that she had thoughts of harming herself but is not sure if this was her intent or just an act to get attention.  She reports patient has never attempted to harm herself in the past.  She reports part of this could be that she is bored because after her knee surgeries she has been unable to work.  She confirms patient is able to return home once discharged.  She reports no other concerns at  present.  Associated Signs/Symptoms: Depression Symptoms:  depressed mood, fatigue, suicidal thoughts with specific plan, anxiety, loss of energy/fatigue, (Hypo) Manic Symptoms:  Irritable Mood, Anxiety Symptoms:  Excessive Worry, Psychotic Symptoms:  Hallucinations: Auditory Visual PTSD Symptoms: Re-experiencing:  Flashbacks Intrusive Thoughts Nightmares Total Time spent with patient: 45 minutes  Past Psychiatric History: Schizoaffective Disorder, Bipolar Type, Anxiety, and Mild Intellectual Disability, and Multiple Psychiatric Hospitalizations (last [facility] 08/2014), and no history of Suicide Attempts or Self Injurious Behavior.  Is the patient at risk to self? Yes.    Has the patient been a risk to self in the past 6 months? No.  Has the patient been a risk to self within the distant past? No.  Is the patient a risk to others? No.  Has the patient been a risk to others in the past 6 months? No.  Has the patient been a risk to others within the distant past? No.   [place] Scale:  Flowsheet Row Ad[facility] (Current) [facility] from 04/04/2023 in [facility] Department at [facility] ED from 03/31/2023 in [facility] Emergency Department at [facility]  C-SSRS RISK CATEGORY High Risk High Risk No Risk        Prior Inpatient Therapy: Yes.   If yes, describe [facility] 08/2014 Prior Outpatient Therapy: Yes.   If yes, describe Top Priority  Alcohol Screening: 1. How often do you have a drink containing alcohol?: Never 2. How many drinks containing alcohol do you have on a typical day when you are drinking?: 1 or 2 3. How often do you have six or more drinks on one occasion?: Never AUDIT-C Score: 0 4. How often during the last year have you found that you were not able to stop drinking once you had started?: Never 5. How often during the last year have you failed to do what was normally expected from you because  of drinking?: Never 6. How often during the last year have you needed a first drink in the morning to get yourself going after a heavy drinking session?: Never 7. How often during the last year have you had a feeling of guilt of remorse after drinking?: Never 8. How often during the last year have you been unable to remember what happened the night before because you had been drinking?: Never 9. Have you or someone else been injured as a result of your drinking?: No 10. Has a relative or friend or a doctor or another health worker been concerned about your drinking or suggested you cut down?: No Alcohol Use Disorder Identification Test Final Score (AUDIT): 0 Substance Abuse History in the last 12 months:  No. Consequences of Substance Abuse: NA Previous Psychotropic Medications: Yes  Abilify, Risperdal, Lamictal, Zyprexa, Zoloft Psychological Evaluations: No  Past Medical History:  Past Medical History:  Diagnosis Date   Arthritis    Bipolar 1 disorder (HCC)    Bowel incontinence    Depression    Diabetes mellitus without complication (  HCC)    Headache    migraines   Mental disability    Sleep apnea     Past Surgical History:  Procedure Laterality Date   ANTERIOR CRUCIATE LIGAMENT REPAIR Right 05/11/2022   Procedure: RIGHT KNEE ARTHROSCOPY; LATERAL MENISECTOMY; ANTERIOR CRUCIATE LIGAMENT (ACL) REPAIR; CHONDROPLASTY;  Surgeon: Frederico Hamman, MD;  Location: [facility] ORS;  Service: Orthopedics;  Laterality: Right;   FOOT SURGERY     KNEE ARTHROSCOPY W/ ACL RECONSTRUCTION Left 10/12/2020   TOOTH EXTRACTION     Family History:  Family History  Problem Relation Age of Onset   High blood pressure Mother    Anxiety disorder Mother    Diabetes Maternal Grandmother    Family Psychiatric  History: No Known Diagnosis', Substance Abuse, or Suicides Tobacco Screening:  Social History   Tobacco Use  Smoking Status Never  Smokeless Tobacco Never    [facility] Tobacco Counseling     Are you  interested in Tobacco Cessation Medications?  No value filed. Counseled patient on smoking cessation:  No value filed. Reason Tobacco Screening Not Completed: No value filed.       Social History:  Social History   Substance and Sexual Activity  Alcohol Use Not Currently     Social History   Substance and Sexual Activity  Drug Use No    Additional Social History:                           Allergies:  No Known Allergies Lab Results:  Results for orders placed or performed during the hospital encounter of 04/04/23 (from the past 48 hour(s))  POC CBG, ED     Status: Abnormal   Collection Time: 04/04/23  4:40 PM  Result Value Ref Range   Glucose-Capillary 105 (H) 70 - 99 mg/dL    Comment: Glucose reference range applies only to samples taken after fasting for at least 8 hours.  POC CBG, ED     Status: None   Collection Time: 04/04/23  6:33 PM  Result Value Ref Range   Glucose-Capillary 99 70 - 99 mg/dL    Comment: Glucose reference range applies only to samples taken after fasting for at least 8 hours.  POC CBG, ED     Status: Abnormal   Collection Time: 04/05/23  6:23 AM  Result Value Ref Range   Glucose-Capillary 105 (H) 70 - 99 mg/dL    Comment: Glucose reference range applies only to samples taken after fasting for at least 8 hours.  Potassium     Status: None   Collection Time: 04/05/23  6:26 AM  Result Value Ref Range   Potassium 3.5 3.5 - 5.1 mmol/L    Comment: Performed at [facility], [address], [place], [place] [address]  POC CBG, ED     Status: Abnormal   Collection Time: 04/05/23 10:18 AM  Result Value Ref Range   Glucose-Capillary 119 (H) 70 - 99 mg/dL    Comment: Glucose reference range applies only to samples taken after fasting for at least 8 hours.  POC CBG, ED     Status: None   Collection Time: 04/05/23  6:52 PM  Result Value Ref Range   Glucose-Capillary 95 70 - 99 mg/dL    Comment: Glucose reference range  applies only to samples taken after fasting for at least 8 hours.  POC CBG, ED     Status: Abnormal   Collection Time: 04/06/23  8:24 AM  Result Value Ref Range   Glucose-Capillary 102 (H) 70 - 99 mg/dL    Comment: Glucose reference range applies only to samples taken after fasting for at least 8 hours.    Blood Alcohol level:  Lab Results  Component Value Date   ETH <10 04/04/2023   ETH <5 09/09/2014    Metabolic Disorder Labs:  Lab Results  Component Value Date   HGBA1C 5.7 (H) 05/02/2022   MPG 116.89 05/02/2022   MPG 108 09/11/2014   No results found for: "PROLACTIN" Lab Results  Component Value Date   CHOL 169 01/06/2020   TRIG 182 (H) 01/06/2020   HDL 42 01/06/2020   CHOLHDL 4.1 09/11/2014   VLDL 13 09/11/2014   LDLCALC 96 01/06/2020   LDLCALC 120 (H) 07/27/2019    Current Medications: Current Facility-Administered Medications  Medication Dose Route Frequency Provider Last Rate Last Admin   acetaminophen (TYLENOL) tablet 650 mg  650 mg Oral Q6H PRN Dahlia Byes C, NP       alum & mag hydroxide-simeth (MAALOX/MYLANTA) 200-200-20 MG/5ML suspension 30 mL  30 mL Oral Q4H PRN Welford Roche, Josephine C, NP       atorvastatin (LIPITOR) tablet 40 mg  40 mg Oral QPM Onuoha, Josephine C, NP       celecoxib (CELEBREX) capsule 200 mg  200 mg Oral BID Welford Roche, Josephine C, NP   200 mg at 04/06/23 1013   docusate sodium (COLACE) capsule 100 mg  100 mg Oral BID Lauro Franklin, MD       doxycycline (VIBRA-TABS) tablet 100 mg  100 mg Oral Q12H Onuoha, Josephine C, NP   100 mg at 04/06/23 1013   gabapentin (NEURONTIN) capsule 300 mg  300 mg Oral QPM Onuoha, Josephine C, NP       hydrOXYzine (ATARAX) tablet 50 mg  50 mg Oral BID PRN Dahlia Byes C, NP       lamoTRIgine (LAMICTAL) tablet 100 mg  100 mg Oral Daily Welford Roche, Josephine C, NP   100 mg at 04/06/23 1013   magnesium hydroxide (MILK OF MAGNESIA) suspension 30 mL  30 mL Oral Daily PRN Dahlia Byes C, NP   30 mL at  04/06/23 1024   [START ON 04/07/2023] metFORMIN (GLUCOPHAGE) tablet 500 mg  500 mg Oral Q breakfast Onuoha, Josephine C, NP       OLANZapine zydis (ZYPREXA) disintegrating tablet 10 mg  10 mg Oral QHS Dahlia Byes C, NP       [START ON 04/07/2023] pneumococcal 20-valent conjugate vaccine (PREVNAR 20) injection 0.5 mL  0.5 mL Intramuscular Tomorrow-1000 Goli, Rulon Eisenmenger, MD       polyethylene glycol (MIRALAX / GLYCOLAX) packet 17 g  17 g Oral Daily PRN Dahlia Byes C, NP       PTA Medications: Medications Prior to Admission  Medication Sig Dispense Refill Last Dose   atorvastatin (LIPITOR) 40 MG tablet Take 40 mg by mouth every evening.      baclofen (LIORESAL) 10 MG tablet Take 10 mg by mouth once a week. Take 1 tablet by mouth twice weekly as needed      doxycycline (VIBRAMYCIN) 100 MG capsule Take 1 capsule (100 mg total) by mouth 2 (two) times daily. 20 capsule 0    gabapentin (NEURONTIN) 300 MG capsule Take 300 mg by mouth every evening.      hydrOXYzine (ATARAX/VISTARIL) 50 MG tablet Take 1 tablet (50 mg total) by mouth every 4 (four) hours as needed for anxiety. (Patient taking differently: Take  50 mg by mouth in the morning and at bedtime.) 45 tablet 0    lamoTRIgine (LAMICTAL) 100 MG tablet Take 100 mg by mouth daily.      LINZESS 72 MCG capsule Take 72 mcg by mouth every morning.      medroxyPROGESTERone (DEPO-PROVERA) 150 MG/ML injection Inject 1 mL (150 mg total) into the muscle every 3 (three) months. For prevention of pregnancy 1 mL     metFORMIN (GLUCOPHAGE) 500 MG tablet Take one tablet by mouth in the morning and one tablet by mouth in the evening (Patient taking differently: Take 500 mg by mouth daily with breakfast.) 60 tablet 0    OLANZapine zydis (ZYPREXA) 15 MG disintegrating tablet Take 1 tablet (15 mg total) by mouth at bedtime. For mood control 30 tablet 0     Musculoskeletal: Strength & Muscle Tone: within normal limits Gait & Station: normal Patient leans:  N/A            Psychiatric Specialty Exam:  Presentation  General Appearance:  Appropriate for Environment; Casual  Eye Contact: Good  Speech: Clear and Coherent; Normal Rate  Speech Volume: Normal  Handedness: Right   Mood and Affect  Mood: Dysphoric; Anxious  Affect: Congruent   Thought Process  Thought Processes: Coherent  Duration of Psychotic Symptoms: chronic Past Diagnosis of Schizophrenia or Psychoactive disorder: No data recorded Descriptions of Associations:Intact  Orientation:Full (Time, Place and Person)  Thought Content:Logical; WDL  Hallucinations:Hallucinations: Auditory; Visual Description of Auditory Hallucinations: Hears deceased Cousin Description of Visual Hallucinations: Sees deceased Cousin  Ideas of Reference:None  Suicidal Thoughts:Suicidal Thoughts: No  Homicidal Thoughts:Homicidal Thoughts: No   Sensorium  Memory: Immediate Good; Recent Good  Judgment: Intact  Insight: Present   Executive Functions  Concentration: Good  Attention Span: Good  Recall: Good  Fund of Knowledge: Good  Language: Good   Psychomotor Activity  Psychomotor Activity: Psychomotor Activity: Normal   Assets  Assets: Communication Skills; Desire for Improvement; Social Support   Sleep  Sleep: Sleep: Good    Physical Exam: Physical Exam Vitals and nursing note reviewed.  Constitutional:      General: She is not in acute distress.    Appearance: Normal appearance. She is obese. She is not ill-appearing or toxic-appearing.  HENT:     Head: Normocephalic and atraumatic.  Pulmonary:     Effort: Pulmonary effort is normal.  Musculoskeletal:        General: Normal range of motion.  Neurological:     General: No focal deficit present.     Mental Status: She is alert.    Review of Systems  Respiratory:  Negative for cough and shortness of breath.   Cardiovascular:  Negative for chest pain.   Gastrointestinal:  Positive for constipation. Negative for abdominal pain, diarrhea, nausea and vomiting.  Neurological:  Negative for dizziness, weakness and headaches.  Psychiatric/Behavioral:  Positive for depression, hallucinations (chronic AVH) and suicidal ideas (no plan). The patient is nervous/anxious.    Blood pressure (!) 130/90, pulse 96, temperature 98.5 F (36.9 C), temperature source Oral, resp. rate 16, height 6' (1.829 m), weight (!) 152.4 kg, SpO2 98%. Body mass index is 45.57 kg/m.  Treatment Plan Summary: Daily contact with patient to assess and evaluate symptoms and progress in treatment and Medication management  Rona L. Casella is a 32 yr old female who presented on 8/15 to [facility] with SI with a plan to jump off a building, she was admitted to [facility] on 8/17.  PPHx is  significant for Schizoaffective Disorder, Bipolar Type, Anxiety, and Mild Intellectual Disability, and Multiple Psychiatric Hospitalizations (last [facility] 08/2014), and no history of Suicide Attempts or Self Injurious Behavior.   Mikalia has been stable on her medications for a while and only had worsening of her symptoms when there were changes in her environment.  Due to this we will not make any changes to her psychiatric medications at this time.  We will observe her and see if now she is in a different environment her symptoms improve.  She does report no BM since Wednesday so we will start scheduled Colace.  We will continue to monitor.    Schizoaffective Disorder, Bipolar Type: -Continue Zyprexa 10 mg QHS for mood stability and psychosis -Continue Lamictal 100 mg daily for mood stability   Constipation: -Start Colace 100 mg BID for 3 doses -Continue Miralax 17 g daily PRN   Hypokalemia: -Stop standing Kdur order -K (8/16): 3.5 -Recheck CMP tomorrow morning prior to continued repletion   -Continue Gabapentin 300 mg QHS -Continue Metformin 500 mg daily -Continue Lipitor 40 mg QHS -Continue  Celebrex 200 mg BID -Continue Doxycycline 100 mg BID for 11 doses -Continue PRN's: Tylenol, Maalox, Atarax, Milk of Magnesia, Trazodone   Observation Level/Precautions:  15 minute checks  Laboratory:  CMP: WNL except K: 2.9,  CBC: WNL except Hem: 10.8,  HCT: 33.7,  UDS: Neg,  EtOH: Neg,  EKG: NSR with Qtc: 452  Psychotherapy:    Medications:  Zyprexa, Lamictal  Consultations:    Discharge Concerns:    Estimated LOS:  Other:     Physician Treatment Plan for Primary Diagnosis: Schizoaffective disorder, bipolar type (HCC) Long Term Goal(s): Improvement in symptoms so as ready for discharge  Short Term Goals: Ability to identify changes in lifestyle to reduce recurrence of condition will improve, Ability to verbalize feelings will improve, Ability to disclose and discuss suicidal ideas, Ability to demonstrate self-control will improve, and Ability to identify and develop effective coping behaviors will improve  Physician Treatment Plan for Secondary Diagnosis: Principal Problem:   Schizoaffective disorder, bipolar type (HCC)  Long Term Goal(s): Improvement in symptoms so as ready for discharge  Short Term Goals: Ability to identify changes in lifestyle to reduce recurrence of condition will improve, Ability to verbalize feelings will improve, Ability to disclose and discuss suicidal ideas, Ability to demonstrate self-control will improve, and Ability to identify and develop effective coping behaviors will improve  I certify that inpatient services furnished can reasonably be expected to improve the patient's condition.    Lauro Franklin, MD 8/17/20243:23 PM

## 2023-04-06 NOTE — Tx Team (Signed)
Initial Treatment Plan 04/06/2023 11:56 AM Cindy Phillips [id number]    PATIENT STRESSORS: Marital or family conflict     PATIENT STRENGTHS: Communication skills  Motivation for treatment/growth  Supportive family/friends    PATIENT IDENTIFIED PROBLEMS: Suicide ideation    "Plan to jump off of a roof"    Panic attacks  Crying speels           DISCHARGE CRITERIA:  Improved stabilization in mood, thinking, and/or behavior Reduction of life-threatening or endangering symptoms to within safe limits Verbal commitment to aftercare and medication compliance  PRELIMINARY DISCHARGE PLAN: Outpatient therapy Return to previous living arrangement  PATIENT/FAMILY INVOLVEMENT: This treatment plan has been presented to and reviewed with the patient, Cindy Phillips,.  The patient has been given the opportunity to ask questions and make suggestions.  Shela Nevin, RN 04/06/2023, 11:56 AM

## 2023-04-06 NOTE — [facility] Suicide Risk Assessment (Signed)
Suicide Risk Assessment  Admission Assessment    [facility] Admission Suicide Risk Assessment   Nursing information obtained from:    Demographic factors:    Current Mental Status:    Loss Factors:    Historical Factors:    Risk Reduction Factors:     Total Time spent with patient: 45 minutes Principal Problem: Schizoaffective disorder, bipolar type (HCC) Diagnosis:  Principal Problem:   Schizoaffective disorder, bipolar type (HCC)  Subjective Data:   Cindy Phillips is a 32 yr old female who presented on 8/15 to [facility] with SI with a plan to jump off a building, she was admitted to [facility] on 8/17.  PPHx is significant for Schizoaffective Disorder, Bipolar Type, Anxiety, and Mild Intellectual Disability, and Multiple Psychiatric Hospitalizations (last [facility] 08/2014), and no history of Suicide Attempts or Self Injurious Behavior.   She reports that she does not like being ignored.  She reports that her mom did not want to talk to her and this kept upsetting her.  She reports she kept trying to talk to her mom and she Being ignored so she started hitting the wall and then stormed off and went to her cousins.  She reports that when she got to her cousins she asked for money to get a bus to go to Hospital.  She reports that she did have thoughts of jumping off of a roof and that she had these thoughts for a long time.  She reports she does regret the argument and what happened.   She reports a past psychiatric history significant for depression and anxiety, per chart review also significant for Schizoaffective Disorder, Bipolar Type and Mild Intellectual Disability.  She reports no history of suicide attempts.  She reports no history of self-injurious behavior.  She reports a history of multiple past psychiatric hospitalizations- last [facility] 08/2014.  She reports past medical history significant for diabetes, elevated cholesterol, low white blood cell count, and sleep apnea (does not wear CPAP).  She reports  past surgical history significant for left and right ACL repairs and wisdom teeth removal x4.  She reports no history of head trauma.  She reports no history of seizures.  She reports NKDA.   She reports currently lives at her mother's house with her mother and grandmother.  She reports she is not employed currently.  She reports graduating high school and having special classes.  She reports no college.  She reports no alcohol use.  She reports no tobacco use.  She reports no substance use.  She reports no current legal issues.  She reports no access to firearms.   Discussed continuing her current medications and she was agreeable with this.  When asked if we could contact her mother she gave permission.  She reports she still has some SI, with no plan.  She reports no HI.  She reports her chronic AVH of her cousin and reports that it is a mix of distressing and comforting.  She reports no BM since Wednesday.  Discussed starting Colace and she was agreeable.  She reports no other concerns at present.       Called patient's mother, [name], [phone].  She reports that the patient has been stable for several years since her last hospitalization in 2016.  She reports that the last couple weeks she was just acting different.  She reports that there were 2 visitors to the house.  She reports that after that she began having flashbacks and nightmares.  She reports  that the patient was claiming she was being ignored because 1 of these persons required assistance.  She reports that this person has now left the house.  She reports that the patient did send her text after storming out that she had thoughts of harming herself but is not sure if this was her intent or just an act to get attention.  She reports patient has never attempted to harm herself in the past.  She reports part of this could be that she is bored because after her knee surgeries she has been unable to work.  She confirms patient is able  to return home once discharged.  She reports no other concerns at present.  Continued Clinical Symptoms:    The "Alcohol Use Disorders Identification Test", Guidelines for Use in Primary Care, Second Edition.  World Science writer [facility]). Score between 0-7:  no or low risk or alcohol related problems. Score between 8-15:  moderate risk of alcohol related problems. Score between 16-19:  high risk of alcohol related problems. Score 20 or above:  warrants further diagnostic evaluation for alcohol dependence and treatment.   CLINICAL FACTORS:   More than one psychiatric diagnosis Previous Psychiatric Diagnoses and Treatments Medical Diagnoses and Treatments/Surgeries   Musculoskeletal: Strength & Muscle Tone: within normal limits Gait & Station: normal Patient leans: N/A  Psychiatric Specialty Exam:  Presentation  General Appearance:  Casual; Neat  Eye Contact: Good  Speech: Clear and Coherent; Normal Rate  Speech Volume: Normal  Handedness: Right   Mood and Affect  Mood: Anxious  Affect: Congruent   Thought Process  Thought Processes: Coherent; Goal Directed  Descriptions of Associations:Intact  Orientation:Full (Time, Place and Person)  Thought Content:Logical  History of Schizophrenia/Schizoaffective disorder:No data recorded Duration of Psychotic Symptoms:No data recorded Hallucinations:Hallucinations: Auditory Description of Auditory Hallucinations: Hears the voice of deceased cousin  Ideas of Reference:None  Suicidal Thoughts:Suicidal Thoughts: No  Homicidal Thoughts:Homicidal Thoughts: No   Sensorium  Memory: Immediate Good; Recent Good; Remote Good  Judgment: Intact  Insight: Present   Executive Functions  Concentration: Good  Attention Span: Good  Recall: Good  Fund of Knowledge: Good  Language: Good   Psychomotor Activity  Psychomotor Activity: Psychomotor Activity: Normal   Assets   Assets: Communication Skills; Desire for Improvement; Housing; Social Support; Physical Health   Sleep  Sleep: Sleep: Good    Physical Exam: Physical Exam Vitals and nursing note reviewed.  Constitutional:      General: She is not in acute distress.    Appearance: Normal appearance. She is obese. She is not ill-appearing or toxic-appearing.  HENT:     Head: Normocephalic and atraumatic.  Pulmonary:     Effort: Pulmonary effort is normal.  Musculoskeletal:        General: Normal range of motion.  Neurological:     General: No focal deficit present.     Mental Status: She is alert.    Review of Systems  Respiratory:  Negative for cough and shortness of breath.   Cardiovascular:  Negative for chest pain.  Gastrointestinal:  Positive for constipation. Negative for abdominal pain, diarrhea, nausea and vomiting.  Neurological:  Negative for dizziness, weakness and headaches.  Psychiatric/Behavioral:  Positive for depression, hallucinations (chronic AVH) and suicidal ideas (no plan). The patient is nervous/anxious.    Blood pressure (!) 130/90, pulse 96, temperature 98.5 F (36.9 C), temperature source Oral, resp. rate 16, height 6' (1.829 m), weight (!) 152.4 kg, SpO2 98%. Body mass index is 45.57 kg/m.  COGNITIVE FEATURES THAT CONTRIBUTE TO RISK:  Loss of executive function    SUICIDE RISK:   Moderate:  Frequent suicidal ideation with limited intensity, and duration, some specificity in terms of plans, no associated intent, good self-control, limited dysphoria/symptomatology, some risk factors present, and identifiable protective factors, including available and accessible social support.  PLAN OF CARE:   Cindy Phillips is a 32 yr old female who presented on 8/15 to [facility] with SI with a plan to jump off a building, she was admitted to [facility] on 8/17.  PPHx is significant for Schizoaffective Disorder, Bipolar Type, Anxiety, and Mild Intellectual Disability, and Multiple  Psychiatric Hospitalizations (last [facility] 08/2014), and no history of Suicide Attempts or Self Injurious Behavior.     Sesley has been stable on her medications for a while and only had worsening of her symptoms when there were changes in her environment.  Due to this we will not make any changes to her psychiatric medications at this time.  We will observe her and see if now she is in a different environment her symptoms improve.  She does report no BM since Wednesday so we will start scheduled Colace.  We will continue to monitor.      Schizoaffective Disorder, Bipolar Type: -Continue Zyprexa 10 mg QHS for mood stability and psychosis -Continue Lamictal 100 mg daily for mood stability     Constipation: -Start Colace 100 mg BID for 3 doses -Continue Miralax 17 g daily PRN     Hypokalemia: -Stop standing Kdur order -K (8/16): 3.5 -Recheck CMP tomorrow morning prior to continued repletion     -Continue Gabapentin 300 mg QHS -Continue Metformin 500 mg daily -Continue Lipitor 40 mg QHS -Continue Celebrex 200 mg BID -Continue Doxycycline 100 mg BID for 11 doses -Continue PRN's: Tylenol, Maalox, Atarax, Milk of Magnesia, Trazodone  I certify that inpatient services furnished can reasonably be expected to improve the patient's condition.   Lauro Franklin, MD 04/06/2023, 10:36 AM

## 2023-04-06 NOTE — ED Notes (Signed)
Pt alert and cooperative at this time. Pt with no complaints.

## 2023-04-06 NOTE — Plan of Care (Signed)
  Problem: Education: Goal: Knowledge of [place] Education information/materials will improve Outcome: Progressing Goal: Emotional status will improve Outcome: Progressing Goal: Mental status will improve Outcome: Progressing   

## 2023-04-06 NOTE — ED Provider Notes (Signed)
Emergency Medicine Observation Re-evaluation Note  Cindy Phillips is a 32 y.o. female, seen on rounds today.  Pt initially presented to the ED for complaints of Suicidal and Homicidal Currently, the patient is awaiting placement.  Physical Exam  BP 110/74 (BP Location: Right Arm)   Pulse 84   Temp 98 F (36.7 C) (Oral)   Resp 20   SpO2 99%  Physical Exam General: Calm Cardiac: Well perfused Lungs: Even respirations Psych: Calm  ED Course / MDM  EKG:   I have reviewed the labs performed to date as well as medications administered while in observation.  Recent changes in the last 24 hours include patient to be admitted to [facility]  Plan  Current plan is for admit.    Maia Plan, MD 04/08/23 [phone]

## 2023-04-07 LAB — LIPID PANEL
Cholesterol: 123 mg/dL (ref 0–200)
HDL: 39 mg/dL — ABNORMAL LOW (ref >40)
LDL Cholesterol: 63 mg/dL (ref 0–99)
Total CHOL/HDL Ratio: 3.2 ratio
Triglycerides: 104 mg/dL (ref <150)
VLDL: 21 mg/dL (ref 0–40)

## 2023-04-07 LAB — HEMOGLOBIN A1C
Hgb A1c MFr Bld: 6.4 % — ABNORMAL HIGH (ref 4.8–5.6)
Mean Plasma Glucose: 136.98 mg/dL

## 2023-04-07 LAB — COMPREHENSIVE METABOLIC PANEL
ALT: 16 U/L (ref 0–44)
AST: 15 U/L (ref 15–41)
Albumin: 3.9 g/dL (ref 3.5–5.0)
Alkaline Phosphatase: 93 U/L (ref 38–126)
Anion gap: 10 (ref 5–15)
BUN: 8 mg/dL (ref 6–20)
CO2: 20 mmol/L — ABNORMAL LOW (ref 22–32)
Calcium: 9.1 mg/dL (ref 8.9–10.3)
Chloride: 105 mmol/L (ref 98–111)
Creatinine, Ser: 0.7 mg/dL (ref 0.44–1.00)
GFR, Estimated: >60 mL/min (ref >60)
Glucose, Bld: 116 mg/dL — ABNORMAL HIGH (ref 70–99)
Potassium: 4.1 mmol/L (ref 3.5–5.1)
Sodium: 135 mmol/L (ref 135–145)
Total Bilirubin: 0.4 mg/dL (ref 0.3–1.2)
Total Protein: 7.6 g/dL (ref 6.5–8.1)

## 2023-04-07 LAB — GLUCOSE, CAPILLARY: Glucose-Capillary: 100 mg/dL — ABNORMAL HIGH (ref 70–99)

## 2023-04-07 NOTE — Plan of Care (Deleted)
  Problem: Education: Goal: Knowledge of [facility] Education information/materials will improve Outcome: Progressing Goal: Emotional status will improve Outcome: Progressing Goal: Mental status will improve Outcome: Progressing   

## 2023-04-07 NOTE — Group Note (Signed)
Date:  04/07/2023 Time:  9:25 PM  Group Topic/Focus:  Wrap-Up Group:   The focus of this group is to help patients review their daily goal of treatment and discuss progress on daily workbooks.    Participation Level:  Active  Participation Quality:  Appropriate, Sharing, and Supportive  Affect:  Appropriate  Cognitive:  Appropriate  Insight: Appropriate  Engagement in Group:  Engaged and Supportive  Modes of Intervention:  Discussion and Socialization  Additional Comments:  The patient stated that she had a good day today. The stated that her day did start off a little rusty but got better as the day went on. The patient stated that her goal for today was to have a good visit with her mother and she did accomplish that goal today. The patient stated that she did not have a goal for tomorrow but would like to work towards being DC soon.   Kennieth Francois 04/07/2023, 9:25 PM

## 2023-04-07 NOTE — [facility] Suicide Risk Assessment (Signed)
[facility] INPATIENT:  Family/Significant Other Suicide Prevention Education  Suicide Prevention Education:  Contact Attempts: [name] [phone], (name of family member/significant other) has been identified by the patient as the family member/significant other with whom the patient will be residing, and identified as the person(s) who will aid the patient in the event of a mental health crisis.  With written consent from the patient, first attempt made to provide suicide prevention education, We were unsuccessful in providing suicide prevention education CSW LVM.  A second attempt will be made during the week prior to discharge  Date and time of first attempt:04/07/2023 @10 :14   Redina Zeller LCSWA 04/07/2023, 10:12 AM

## 2023-04-07 NOTE — Progress Notes (Addendum)
   04/07/23 1000  Psych Admission Type (Psych Patients Only)  Admission Status Voluntary  Psychosocial Assessment  Patient Complaints None  Eye Contact Fair  Facial Expression Anxious  Affect Appropriate to circumstance  Speech Logical/coherent  Interaction Assertive  Motor Activity Fidgety  Appearance/Hygiene Unremarkable  Behavior Characteristics Cooperative;Appropriate to situation  Mood Anxious;Pleasant  Thought Process  Coherency WDL  Content WDL  Delusions None reported or observed  Perception WDL  Hallucination None reported or observed  Judgment Poor  Confusion None  Danger to Self  Current suicidal ideation? Denies  Agreement Not to Harm Self Yes  Description of Agreement agreed to contact staff before acting on harmful thoughts  Danger to Others  Danger to Others None reported or observed

## 2023-04-07 NOTE — Progress Notes (Signed)
   04/06/23 2000  Psych Admission Type (Psych Patients Only)  Admission Status Voluntary  Psychosocial Assessment  Patient Complaints Other (Comment) (pt c/o knee pain, medication given)  Eye Contact Fair  Facial Expression Anxious  Affect Appropriate to circumstance;Anxious;Preoccupied  Speech Logical/coherent  Interaction Assertive  Motor Activity Fidgety  Appearance/Hygiene Unremarkable  Behavior Characteristics Cooperative;Appropriate to situation  Mood Anxious;Pleasant  Thought Process  Coherency WDL  Content WDL  Delusions None reported or observed  Perception WDL  Hallucination None reported or observed  Judgment Poor  Confusion None  Danger to Self  Current suicidal ideation? Passive  Description of Suicide Plan jump off roof  Self-Injurious Behavior No self-injurious ideation or behavior indicators observed or expressed   Agreement Not to Harm Self Yes  Description of Agreement verbal contract

## 2023-04-07 NOTE — B[facility] Counselor (Signed)
Adult Comprehensive Assessment  Patient ID: Cindy Phillips, female   DOB: 1991/03/05, 32 y.o.   MRN: [id number]  Information Source: Information source: Patient  Current Stressors:  Patient states their primary concerns and needs for treatment are:: Pt reported wanting to kill herself and threats to kill people. "have alot of anger built up" Patient states their goals for this hospitilization and ongoing recovery are:: "i dont have any goals" Educational / Learning stressors: "no" Employment / Job issues: not working Family Relationships: "not really, no concernsEngineer, petroleum / Lack of resources (include bankruptcy): "no" Housing / Lack of housing: pt lives with mother and grandmother Physical health (include injuries & life threatening diseases): pt reported that she is having complications with pain and burning in her leg and is experiencing swelling Social relationships: "no friend" Substance abuse: social drinking (alcohol) Bereavement / Loss: pt reported that her cousin passed away last year, feels that they maybe another reason why shes in her  Living/Environment/Situation:  Living Arrangements: Parent, Other relatives Living conditions (as described by patient or guardian): "alright" Who else lives in the home?: mom and grandmother How long has patient lived in current situation?: pt lived with them since birth What is atmosphere in current home: Loving  Family History:  Marital status: Single Does patient have children?: No  Childhood History:  By whom was/is the patient raised?: Mother Additional childhood history information: n/a Description of patient's relationship with caregiver when they were a child: pt reported that is was alright, go in alot of trouble. Patient's description of current relationship with people who raised him/her: pt reported that her current relationship is on alittle bit of a bad side and sometimes on a good side How were you disciplined when you  got in trouble as a child/adolescent?: pt reported not receiving any discipline as a child Does patient have siblings?: Yes Number of Siblings: 1 Description of patient's current relationship with siblings: pt reported hacing one brother, she doesnt have a relationship with him, he lives out of town. Did patient suffer any verbal/emotional/physical/sexual abuse as a child?: No Did patient suffer from severe childhood neglect?: No Has patient ever been sexually abused/assaulted/raped as an adolescent or adult?: No Was the patient ever a victim of a crime or a disaster?: No Witnessed domestic violence?: No Has patient been affected by domestic violence as an adult?: No  Education:  Highest grade of school patient has completed: pt reported 12th grade Currently a student?: No Learning disability?: Yes What learning problems does patient have?: pt reported having speech delays and IEP  Employment/Work Situation:   Employment Situation: On disability Why is Patient on Disability: pt received disability for intellectual disability but unsure of eberything How Long has Patient Been on Disability: "a long time" Patient's Job has Been Impacted by Current Illness: Yes Describe how Patient's Job has Been Impacted: pt unable to work What is the Longest Time Patient has Held a Job?: n/a Where was the Patient Employed at that Time?: n/a Has Patient ever Been in the [employer]?: No  Financial Resources:   Surveyor, quantity resources: Writer, Medicare Does patient have a Lawyer or guardian?: No  Alcohol/Substance Abuse:   What has been your use of drugs/alcohol within the last 12 months?: alcohol at social gatherings Has alcohol/substance abuse ever caused legal problems?: No  Social Support System:   Conservation officer, nature Support System: Fair Museum/gallery exhibitions officer System: pt reports having her mom as her main supports Type of faith/religion: "no" How does  patient's faith help to  cope with current illness?: n/a  Leisure/Recreation:   Do You Have Hobbies?: Yes Leisure and Hobbies: pt 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  Strengths/Needs:   What is the patient's perception of their strengths?: playing sports Patient states they can use these personal strengths during their treatment to contribute to their recovery: pt reports that shes good when shes activite she okay Patient states these barriers may affect/interfere with their treatment: "nothing" Patient states these barriers may affect their return to the community: "none" Other important information patient would like considered in planning for their treatment: "no"  Discharge Plan:   Currently receiving community mental health services: Yes (From Whom) ([facility]) Patient states concerns and preferences for aftercare planning are: no conerns Patient states they will know when they are safe and ready for discharge when: "i dont know, whenever the provider tell me im ready" Does patient have access to transportation?: Yes Does patient have financial barriers related to discharge medications?: No Patient description of barriers related to discharge medications: none reported Will patient be returning to same living situation after discharge?: Yes  Summary/Recommendations:   Summary and Recommendations (to be completed by the evaluator): 32 yr old female who presented with  SI with a plan to jump off a building, She is significant for Schizoaffective Disorder, Bipolar Type, Anxiety, and Mild Intellectual Disability, and Multiple Psychiatric Hospitalizations (last [facility] 08/2014), and no history of Suicide Attempts or Self Injurious Behavior.  Pt reports wanting to harm herself and others, she reports having some built up anger. Pt reports currently being in therapy to assist with developing skills to aid in mananging her anger.                                                      Patient would benefit from group therapy, medication management, psychoeducation, crisis stabilization, peer support and discharge planning.  At discharge it is recommended that the patient adhere to the established aftercare plan.   Steffanie Dunn. LCSWA 04/07/2023

## 2023-04-07 NOTE — [facility] Group Notes (Signed)
LCSW Group Therapy Note  04/07/2023   10:00-11:00am   Type of Therapy and Topic:  Group Therapy: Anger Cues and Responses  Participation Level:  Minimal   Description of Group:   In this group, patients learned how to recognize the physical, cognitive, emotional, and behavioral responses they have to anger-provoking situations.  They identified a recent time they became angry and how they reacted.  They analyzed how their reaction was possibly beneficial and how it was possibly unhelpful.  The group discussed a variety of healthier coping skills that could help with such a situation in the future.  They also learned that anger is a second emotion fueled by other feelings and explored their own emotions that may frequently fuel their anger.  Focus was placed on how helpful it is to recognize the underlying emotions to our anger, because working on those can lead to a more permanent solution as well as our ability to focus on the important rather than the urgent.  Therapeutic Goals: Patients will remember their last incident of anger and how they felt emotionally and physically, what their thoughts were at the time, and how they behaved. Patients will identify how their behavior at that time worked for them, as well as how it worked against them. Patients will explore possible new behaviors to use in future anger situations. Patients will learn that anger itself is normal and cannot be eliminated, and that healthier reactions can assist with resolving conflict rather than worsening situations. Patients will learn that anger is a secondary emotion and worked to identify some of the underlying feelings that may lead to anger.  Summary of Patient Progress:  Patient was present in group, listening but declined sharing.   Therapeutic Modalities:   Cognitive Behavioral Therapy  Shellia Cleverly

## 2023-04-07 NOTE — Progress Notes (Signed)
[facility] MD Progress Note  04/07/2023 11:25 AM SOPHIAGRACE QUINLEY  MRN:  [id number] Subjective:   Wende L. Emmendorfer is a 32 yr old female who presented on 8/15 to [facility] with SI with a plan to jump off a building, she was admitted to [facility] on 8/17. PPHx is significant for Schizoaffective Disorder, Bipolar Type, Anxiety, and Mild Intellectual Disability, and Multiple Psychiatric Hospitalizations (last [facility] 08/2014), and no history of Suicide Attempts or Self Injurious Behavior.    Case was discussed in the multidisciplinary team. MAR was reviewed and patient was compliant with medications.  She received PRN Milk of Magnesium.    Psychiatric Team made the following recommendations yesterday: -Continue Zyprexa 10 mg QHS for mood stability and psychosis -Continue Lamictal 100 mg daily for mood stability     On interview today patient reports she slept good last night.  She reports her appetite is doing good.  She reports no SI, or HI.  She reports continued chronic AVH.  She reports no Paranoia or Ideas of Reference.  She reports no issues with her medications.  She reports that since yesterday her leg swelling has gone down and it is no longer warm to the touch.  She reports continuing to have some pain in her right lower leg.  She reports no other issues at this time related to it.  Discussed with her that we would continue with the doxycycline but that if her symptoms worsened we would consult the hospital service for further recommendations and she reported agreement.  She reports no other concerns present.   Principal Problem: Schizoaffective disorder, bipolar type (HCC) Diagnosis: Principal Problem:   Schizoaffective disorder, bipolar type (HCC)  Total Time spent with patient:  I personally spent 35 minutes on the unit in direct patient care. The direct patient care time included face-to-face time with the patient, reviewing the patient's chart, communicating with other professionals, and  coordinating care. Greater than 50% of this time was spent in counseling or coordinating care with the patient regarding goals of hospitalization, psycho-education, and discharge planning needs.    Past Psychiatric History: Schizoaffective Disorder, Bipolar Type, Anxiety, and Mild Intellectual Disability, and Multiple Psychiatric Hospitalizations (last [facility] 08/2014), and no history of Suicide Attempts or Self Injurious Behavior.   Past Medical History:  Past Medical History:  Diagnosis Date   Arthritis    Bipolar 1 disorder (HCC)    Bowel incontinence    Depression    Diabetes mellitus without complication (HCC)    Headache    migraines   Mental disability    Sleep apnea     Past Surgical History:  Procedure Laterality Date   ANTERIOR CRUCIATE LIGAMENT REPAIR Right 05/11/2022   Procedure: RIGHT KNEE ARTHROSCOPY; LATERAL MENISECTOMY; ANTERIOR CRUCIATE LIGAMENT (ACL) REPAIR; CHONDROPLASTY;  Surgeon: Frederico Hamman, MD;  Location: [facility];  Service: Orthopedics;  Laterality: Right;   FOOT SURGERY     KNEE ARTHROSCOPY W/ ACL RECONSTRUCTION Left 10/12/2020   TOOTH EXTRACTION     Family History:  Family History  Problem Relation Age of Onset   High blood pressure Mother    Anxiety disorder Mother    Diabetes Maternal Grandmother    Family Psychiatric  History: No Known Diagnosis', Substance Abuse, or Suicides  Social History:  Social History   Substance and Sexual Activity  Alcohol Use Not Currently     Social History   Substance and Sexual Activity  Drug Use No    Social History   Socioeconomic History  Marital status: Single    Spouse name: Not on file   Number of children: 0   Years of education: Not on file   Highest education level: Not on file  Occupational History   Occupation: Nature conservation officer     Comment: [employer]   Tobacco Use   Smoking status: Never   Smokeless tobacco: Never  Vaping Use   Vaping status: Never Used  Substance and Sexual Activity   Alcohol  use: Not Currently   Drug use: No   Sexual activity: Not Currently  Other Topics Concern   Not on file  Social History Narrative   Not on file   Social Determinants of Health   Financial Resource Strain: Not on file  Food Insecurity: No Food Insecurity (04/06/2023)   Hunger Vital Sign    Worried About Running Out of Food in the Last Year: Never true    Ran Out of Food in the Last Year: Never true  Transportation Needs: No Transportation Needs (04/06/2023)   PRAPARE - Administrator, Civil Service (Medical): No    Lack of Transportation (Non-Medical): No  Physical Activity: Not on file  Stress: Not on file  Social Connections: Not on file   Additional Social History:                         Sleep: Good  Appetite:  Good  Current Medications: Current Facility-Administered Medications  Medication Dose Route Frequency Provider Last Rate Last Admin   acetaminophen (TYLENOL) tablet 650 mg  650 mg Oral Q6H PRN Dahlia Byes C, NP   650 mg at 04/07/23 0743   alum & mag hydroxide-simeth (MAALOX/MYLANTA) 200-200-20 MG/5ML suspension 30 mL  30 mL Oral Q4H PRN Dahlia Byes C, NP       atorvastatin (LIPITOR) tablet 40 mg  40 mg Oral QPM Onuoha, Josephine C, NP   40 mg at 04/06/23 1803   celecoxib (CELEBREX) capsule 200 mg  200 mg Oral BID Dahlia Byes C, NP   200 mg at 04/07/23 0741   docusate sodium (COLACE) capsule 100 mg  100 mg Oral BID Lauro Franklin, MD   100 mg at 04/07/23 0741   doxycycline (VIBRA-TABS) tablet 100 mg  100 mg Oral Q12H Onuoha, Josephine C, NP   100 mg at 04/07/23 0741   gabapentin (NEURONTIN) capsule 300 mg  300 mg Oral QPM Onuoha, Josephine C, NP   300 mg at 04/06/23 1803   hydrOXYzine (ATARAX) tablet 50 mg  50 mg Oral BID PRN Dahlia Byes C, NP       lamoTRIgine (LAMICTAL) tablet 100 mg  100 mg Oral Daily Onuoha, Josephine C, NP   100 mg at 04/07/23 0740   magnesium hydroxide (MILK OF MAGNESIA) suspension 30 mL  30 mL  Oral Daily PRN Dahlia Byes C, NP   30 mL at 04/06/23 1024   metFORMIN (GLUCOPHAGE) tablet 500 mg  500 mg Oral Q breakfast Onuoha, Josephine C, NP   500 mg at 04/07/23 0740   OLANZapine zydis (ZYPREXA) disintegrating tablet 10 mg  10 mg Oral QHS Onuoha, Josephine C, NP   10 mg at 04/06/23 2113   pneumococcal 20-valent conjugate vaccine (PREVNAR 20) injection 0.5 mL  0.5 mL Intramuscular Tomorrow-1000 Goli, Rulon Eisenmenger, MD       polyethylene glycol (MIRALAX / GLYCOLAX) packet 17 g  17 g Oral Daily PRN Earney Navy, NP        Lab Results:  Results for orders placed or performed during the hospital encounter of 04/06/23 (from the past 48 hour(s))  Comprehensive metabolic panel     Status: Abnormal   Collection Time: 04/07/23  6:42 AM  Result Value Ref Range   Sodium 135 135 - 145 mmol/L   Potassium 4.1 3.5 - 5.1 mmol/L   Chloride 105 98 - 111 mmol/L   CO2 20 (L) 22 - 32 mmol/L   Glucose, Bld 116 (H) 70 - 99 mg/dL    Comment: Glucose reference range applies only to samples taken after fasting for at least 8 hours.   BUN 8 6 - 20 mg/dL   Creatinine, Ser 1.61 0.44 - 1.00 mg/dL   Calcium 9.1 8.9 - 09.6 mg/dL   Total Protein 7.6 6.5 - 8.1 g/dL   Albumin 3.9 3.5 - 5.0 g/dL   AST 15 15 - 41 U/L   ALT 16 0 - 44 U/L   Alkaline Phosphatase 93 38 - 126 U/L   Total Bilirubin 0.4 0.3 - 1.2 mg/dL   GFR, Estimated >04 >54 mL/min    Comment: (NOTE) Calculated using the CKD-EPI Creatinine Equation (2021)    Anion gap 10 5 - 15    Comment: Performed at [facility], [address] [place] [address], [place], [place] [address]  Hemoglobin A1c     Status: Abnormal   Collection Time: 04/07/23  6:42 AM  Result Value Ref Range   Hgb A1c MFr Bld 6.4 (H) 4.8 - 5.6 %    Comment: (NOTE) Pre diabetes:          5.7%-6.4%  Diabetes:              >6.4%  Glycemic control for   <7.0% adults with diabetes    Mean Plasma Glucose 136.98 mg/dL    Comment: Performed at [facility], [address], [place], [place] [address]  Lipid panel     Status: Abnormal   Collection Time: 04/07/23  6:42 AM  Result Value Ref Range   Cholesterol 123 0 - 200 mg/dL   Triglycerides 295 <621 mg/dL   HDL 39 (L) >30 mg/dL   Total CHOL/HDL Ratio 3.2 RATIO   VLDL 21 0 - 40 mg/dL   LDL Cholesterol 63 0 - 99 mg/dL    Comment:        Total Cholesterol/HDL:CHD Risk Coronary Heart Disease Risk Table                     Men   Women  1/2 Average Risk   3.4   3.3  Average Risk       5.0   4.4  2 X Average Risk   9.6   7.1  3 X Average Risk  23.4   11.0        Use the calculated Patient Ratio above and the CHD Risk Table to determine the patient's CHD Risk.        ATP III CLASSIFICATION (LDL):  <100     mg/dL   Optimal  865-784  mg/dL   Near or Above                    Optimal  130-159  mg/dL   Borderline  696-295  mg/dL   High  >284     mg/dL   Very High Performed at [facility], [address], [place], [place] [address]     Blood Alcohol level:  Lab Results  Component Value Date   ETH <10 04/04/2023   ETH <5 09/09/2014    Metabolic Disorder Labs: Lab Results  Component Value Date   HGBA1C 6.4 (H) 04/07/2023   MPG 136.98 04/07/2023   MPG 116.89 05/02/2022   No results found for: "PROLACTIN" Lab Results  Component Value Date   CHOL 123 04/07/2023   TRIG 104 04/07/2023   HDL 39 (L) 04/07/2023   CHOLHDL 3.2 04/07/2023   VLDL 21 04/07/2023   LDLCALC 63 04/07/2023   LDLCALC 96 01/06/2020    Physical Findings: AIMS:  , ,  ,  ,    CIWA:    COWS:     Musculoskeletal: Strength & Muscle Tone: within normal limits Gait & Station: normal Patient leans: N/A  Psychiatric Specialty Exam:  Presentation  General Appearance:  Appropriate for Environment; Casual  Eye Contact: Good  Speech: Clear and Coherent; Normal Rate  Speech Volume: Normal  Handedness: Right   Mood and Affect  Mood: Dysphoric  Affect: Congruent   Thought Process   Thought Processes: Coherent  Descriptions of Associations:Intact  Orientation:Full (Time, Place and Person)  Thought Content:Logical; WDL  History of Schizophrenia/Schizoaffective disorder:No data recorded Duration of Psychotic Symptoms:No data recorded Hallucinations:Hallucinations: Auditory; Visual Description of Auditory Hallucinations: Hears deceased cousin occasionally Description of Visual Hallucinations: Sees deceased cousin occasionally  Ideas of Reference:None  Suicidal Thoughts:Suicidal Thoughts: No  Homicidal Thoughts:Homicidal Thoughts: No   Sensorium  Memory: Immediate Good; Recent Good  Judgment: Intact  Insight: Present   Executive Functions  Concentration: Good  Attention Span: Good  Recall: Good  Fund of Knowledge: Good  Language: Good   Psychomotor Activity  Psychomotor Activity: Psychomotor Activity: Normal   Assets  Assets: Communication Skills; Desire for Improvement; Social Support   Sleep  Sleep: Sleep: Good Number of Hours of Sleep: 7.5    Physical Exam: Physical Exam Constitutional:      General: She is not in acute distress.    Appearance: Normal appearance. She is obese. She is not ill-appearing or toxic-appearing.  HENT:     Head: Normocephalic and atraumatic.  Pulmonary:     Effort: Pulmonary effort is normal.  Musculoskeletal:        General: Normal range of motion.  Neurological:     General: No focal deficit present.     Mental Status: She is alert.    Review of Systems  Respiratory:  Negative for cough and shortness of breath.   Cardiovascular:  Negative for chest pain.  Gastrointestinal:  Negative for abdominal pain, constipation, diarrhea, nausea and vomiting.  Neurological:  Negative for dizziness, weakness and headaches.  Psychiatric/Behavioral:  Positive for hallucinations (chronic AVH). Negative for depression and suicidal ideas. The patient is not nervous/anxious.    Blood pressure (!)  139/94, pulse (!) 121, temperature 98.3 F (36.8 C), resp. rate 20, height 6' (1.829 m), weight (!) 152.4 kg, SpO2 100%. Body mass index is 45.57 kg/m.   Treatment Plan Summary: Daily contact with patient to assess and evaluate symptoms and progress in treatment and Medication management  Arra L. Schreiter is a 32 yr old female who presented on 8/15 to [facility] with SI with a plan to jump off a building, she was admitted to [facility] on 8/17. PPHx is significant for Schizoaffective Disorder, Bipolar Type, Anxiety, and Mild Intellectual Disability, and Multiple Psychiatric Hospitalizations (last [facility] 08/2014), and no history of Suicide Attempts or Self Injurious Behavior.    Ranim is responding well to the milieu  of the unit.  She continues to have her chronic AVH but is no longer having any SI or HI.  She did have warmth and swelling in her right leg yesterday which has improved but she continues to have some pain.  We will continue with the doxycycline at this time but if pain worsens we will consult hospitalist service for further recommendations.  We will not make any changes to her medication at this time.  We will continue to monitor.   Schizoaffective Disorder, Bipolar Type: -Continue Zyprexa 10 mg QHS for mood stability and psychosis -Continue Lamictal 100 mg daily for mood stability     Constipation: -Start Colace 100 mg BID for 3 doses -Continue Miralax 17 g daily PRN     Hypokalemia(resolved): -K: 4.1     -Continue Gabapentin 300 mg QHS -Continue Metformin 500 mg daily -Continue Lipitor 40 mg QHS -Continue Celebrex 200 mg BID -Continue Doxycycline 100 mg BID for cellulitis -Continue PRN's: Tylenol, Maalox, Atarax, Milk of Magnesia, Trazodone   Lauro Franklin, MD 04/07/2023, 11:25 AM

## 2023-04-07 NOTE — BHH Group Notes (Signed)
The focus of this group is to help patients establish daily goals to achieve during treatment and discuss how the patient can incorporate goal setting into their daily lives to aide in recovery.  Pt attended Orientation Goals Group. Pt was given information about schedule, house rules and an opportunity to ask questions. Pt was given education about setting SMART goals.

## 2023-04-07 NOTE — Plan of Care (Signed)
  Problem: Education: Goal: Knowledge of [place] [facility] Education information/materials will improve 04/07/2023 1933 by Shela Nevin, RN Outcome: Progressing 04/07/2023 1933 by Shela Nevin, RN Outcome: Progressing Goal: Emotional status will improve Outcome: Progressing Goal: Mental status will improve 04/07/2023 1933 by Shela Nevin, RN Outcome: Progressing 04/07/2023 1933 by Shela Nevin, RN Outcome: Progressing Goal: Verbalization of understanding the information provided will improve Outcome: Progressing

## 2023-04-07 NOTE — Progress Notes (Signed)
   04/07/23 0556  15 Minute Checks  Location Bedroom  Visual Appearance Calm  Behavior Composed  Sleep (Behavioral Health Patients Only)  Calculate sleep? (Click Yes once per 24 hr at 0600 safety check) Yes  Documented sleep last 24 hours 7.5

## 2023-04-07 NOTE — Progress Notes (Signed)
   04/07/23 2010  Psych Admission Type (Psych Patients Only)  Admission Status Voluntary  Psychosocial Assessment  Patient Complaints None  Eye Contact Fair  Facial Expression Anxious  Affect Appropriate to circumstance;Anxious;Silly  Speech Logical/coherent  Interaction Assertive  Motor Activity Restless  Appearance/Hygiene Unremarkable  Behavior Characteristics Cooperative;Appropriate to situation;Anxious  Mood Anxious;Pleasant  Thought Process  Coherency WDL  Content WDL  Delusions None reported or observed  Perception WDL  Hallucination None reported or observed  Judgment Poor  Confusion None  Danger to Self  Current suicidal ideation? Passive  Self-Injurious Behavior No self-injurious ideation or behavior indicators observed or expressed   Agreement Not to Harm Self Yes  Description of Agreement verbal contract

## 2023-04-08 ENCOUNTER — Encounter (HOSPITAL_COMMUNITY): Payer: Self-pay

## 2023-04-08 DIAGNOSIS — F250 Schizoaffective disorder, bipolar type: Secondary | ICD-10-CM | POA: Diagnosis not present

## 2023-04-08 LAB — GLUCOSE, CAPILLARY
Glucose-Capillary: 99 mg/dL (ref 70–99)
Glucose-Capillary: 126 mg/dL — ABNORMAL HIGH (ref 70–99)

## 2023-04-08 NOTE — Progress Notes (Signed)
    04/08/23 2100  Psych Admission Type (Psych Patients Only)  Admission Status Voluntary  Psychosocial Assessment  Patient Complaints None  Eye Contact Fair  Facial Expression Animated;Anxious  Affect Appropriate to circumstance;Silly  Speech Logical/coherent  Interaction Assertive  Motor Activity Fidgety  Appearance/Hygiene Unremarkable  Behavior Characteristics Cooperative;Appropriate to situation  Mood Pleasant;Anxious  Thought Process  Coherency WDL  Content WDL  Delusions None reported or observed  Perception WDL  Hallucination None reported or observed  Judgment Poor  Confusion None  Danger to Self  Current suicidal ideation? Denies  Self-Injurious Behavior No self-injurious ideation or behavior indicators observed or expressed   Agreement Not to Harm Self Yes  Description of Agreement verbal  Danger to Others  Danger to Others None reported or observed

## 2023-04-08 NOTE — Plan of Care (Signed)
 Problem: Education: Goal: Knowledge of [place] [facility] Education information/materials will improve Outcome: Progressing Goal: Emotional status will improve Outcome: Progressing Goal: Mental status will improve Outcome: Progressing Goal: Verbalization of understanding the information provided will improve Outcome: Progressing   Problem: Activity: Goal: Interest or engagement in activities will improve Outcome: Progressing Goal: Sleeping patterns will improve Outcome: Progressing   Problem: Coping: Goal: Ability to verbalize frustrations and anger appropriately will improve Outcome: Progressing Goal: Ability to demonstrate self-control will improve Outcome: Progressing   Problem: Health Behavior/Discharge Planning: Goal: Identification of resources available to assist in meeting health care needs will improve Outcome: Progressing Goal: Compliance with treatment plan for underlying cause of condition will improve Outcome: Progressing   Problem: Physical Regulation: Goal: Ability to maintain clinical measurements within normal limits will improve Outcome: Progressing   Problem: Safety: Goal: Periods of time without injury will increase Outcome: Progressing   Problem: Education: Goal: Knowledge of [place] [facility] Education information/materials will improve Outcome: Progressing Goal: Emotional status will improve Outcome: Progressing Goal: Mental status will improve Outcome: Progressing Goal: Verbalization of understanding the information provided will improve Outcome: Progressing   Problem: Activity: Goal: Interest or engagement in activities will improve Outcome: Progressing Goal: Sleeping patterns will improve Outcome: Progressing   Problem: Coping: Goal: Ability to verbalize frustrations and anger appropriately will improve Outcome: Progressing Goal: Ability to demonstrate self-control will improve Outcome: Progressing   Problem: Health  Behavior/Discharge Planning: Goal: Identification of resources available to assist in meeting health care needs will improve Outcome: Progressing Goal: Compliance with treatment plan for underlying cause of condition will improve Outcome: Progressing   Problem: Physical Regulation: Goal: Ability to maintain clinical measurements within normal limits will improve Outcome: Progressing   Problem: Safety: Goal: Periods of time without injury will increase Outcome: Progressing   Problem: Education: Goal: Ability to make informed decisions regarding treatment will improve Outcome: Progressing   Problem: Coping: Goal: Coping ability will improve Outcome: Progressing   Problem: Health Behavior/Discharge Planning: Goal: Identification of resources available to assist in meeting health care needs will improve Outcome: Progressing   Problem: Medication: Goal: Compliance with prescribed medication regimen will improve Outcome: Progressing   Problem: Self-Concept: Goal: Ability to disclose and discuss suicidal ideas will improve Outcome: Progressing Goal: Will verbalize positive feelings about self Outcome: Progressing   Problem: Education: Goal: Utilization of techniques to improve thought processes will improve Outcome: Progressing Goal: Knowledge of the prescribed therapeutic regimen will improve Outcome: Progressing   Problem: Activity: Goal: Interest or engagement in leisure activities will improve Outcome: Progressing Goal: Imbalance in normal sleep/wake cycle will improve Outcome: Progressing   Problem: Coping: Goal: Coping ability will improve Outcome: Progressing Goal: Will verbalize feelings Outcome: Progressing   Problem: Health Behavior/Discharge Planning: Goal: Ability to make decisions will improve Outcome: Progressing Goal: Compliance with therapeutic regimen will improve Outcome: Progressing   Problem: Role Relationship: Goal: Will demonstrate positive  changes in social behaviors and relationships Outcome: Progressing   Problem: Safety: Goal: Ability to disclose and discuss suicidal ideas will improve Outcome: Progressing Goal: Ability to identify and utilize support systems that promote safety will improve Outcome: Progressing   Problem: Self-Concept: Goal: Will verbalize positive feelings about self Outcome: Progressing Goal: Level of anxiety will decrease Outcome: Progressing   Problem: Education: Goal: Ability to state activities that reduce stress will improve Outcome: Progressing   Problem: Coping: Goal: Ability to identify and develop effective coping behavior will improve Outcome: Progressing   Problem: Self-Concept: Goal: Ability to  identify factors that promote anxiety will improve Outcome: Progressing Goal: Level of anxiety will decrease Outcome: Progressing Goal: Ability to modify response to factors that promote anxiety will improve Outcome: Progressing

## 2023-04-08 NOTE — Progress Notes (Signed)
   04/08/23 0555  15 Minute Checks  Location Bedroom  Visual Appearance Calm  Behavior Sleeping  Sleep (Behavioral Health Patients Only)  Calculate sleep? (Click Yes once per 24 hr at 0600 safety check) Yes  Documented sleep last 24 hours 8

## 2023-04-08 NOTE — Progress Notes (Signed)
Patient appears anxious. Patient denies SI/HI/AVH. Pt reports anxiety is 1/10 and depression is 1/10. Pt reports good sleep and good appetite. Patient complied with morning medication with no reported side effects. Patient remains safe on Q44min checks and contracts for safety.       04/08/23 0826  Psych Admission Type (Psych Patients Only)  Admission Status Voluntary  Psychosocial Assessment  Patient Complaints None  Eye Contact Fair  Facial Expression Animated  Affect Appropriate to circumstance;Silly  Speech Logical/coherent  Interaction Assertive  Motor Activity Fidgety  Appearance/Hygiene Unremarkable  Behavior Characteristics Cooperative;Appropriate to situation  Mood Irritable;Anxious;Pleasant  Thought Process  Coherency WDL  Content WDL  Delusions None reported or observed  Perception WDL  Hallucination None reported or observed  Judgment Poor  Confusion None  Danger to Self  Current suicidal ideation? Denies  Agreement Not to Harm Self Yes  Description of Agreement verbal  Danger to Others  Danger to Others None reported or observed

## 2023-04-08 NOTE — Progress Notes (Signed)
[facility] MD Progress Note  04/08/2023 11:58 AM Cindy Phillips  MRN:  [id number] Subjective:   Cindy Phillips is a 32 yr old female who presented on 8/15 to [facility] with SI with a plan to jump off a building, she was admitted to [facility] on 8/17. PPHx is significant for Schizoaffective Disorder, Bipolar Type, Anxiety, and Mild Intellectual Disability, and Multiple Psychiatric Hospitalizations (last [facility] 08/2014), and no history of Suicide Attempts or Self Injurious Behavior.    Case was discussed in the multidisciplinary team. MAR was reviewed and patient was compliant with medications.  She received PRN Milk of Magnesium.    Psychiatric Team made the following recommendations yesterday: -Continue Zyprexa 10 mg QHS for mood stability and psychosis -Continue Lamictal 100 mg daily for mood stability    On interview today patient reports she slept good last night.  She reports her appetite is doing good.  She reports no SI, HI, or AVH.  She reports no Paranoia or Ideas of Reference.  She reports no issues with her medications.  She reports that she is doing good.  She reports that her leg has not had any more swelling.  She reports still having some pain but that this too is improving.  Discussed with her that we would plan for discharge tomorrow.  She reports no other concerns at present.    Principal Problem: Schizoaffective disorder, bipolar type (HCC) Diagnosis: Principal Problem:   Schizoaffective disorder, bipolar type (HCC)  Total Time spent with patient:  I personally spent 35 minutes on the unit in direct patient care. The direct patient care time included face-to-face time with the patient, reviewing the patient's chart, communicating with other professionals, and coordinating care. Greater than 50% of this time was spent in counseling or coordinating care with the patient regarding goals of hospitalization, psycho-education, and discharge planning needs.    Past Psychiatric History:  Schizoaffective Disorder, Bipolar Type, Anxiety, and Mild Intellectual Disability, and Multiple Psychiatric Hospitalizations (last [facility] 08/2014), and no history of Suicide Attempts or Self Injurious Behavior.   Past Medical History:  Past Medical History:  Diagnosis Date   Arthritis    Bipolar 1 disorder (HCC)    Bowel incontinence    Depression    Diabetes mellitus without complication (HCC)    Headache    migraines   Mental disability    Sleep apnea     Past Surgical History:  Procedure Laterality Date   ANTERIOR CRUCIATE LIGAMENT REPAIR Right 05/11/2022   Procedure: RIGHT KNEE ARTHROSCOPY; LATERAL MENISECTOMY; ANTERIOR CRUCIATE LIGAMENT (ACL) REPAIR; CHONDROPLASTY;  Surgeon: Frederico Hamman, MD;  Location: [facility] ORS;  Service: Orthopedics;  Laterality: Right;   FOOT SURGERY     KNEE ARTHROSCOPY W/ ACL RECONSTRUCTION Left 10/12/2020   TOOTH EXTRACTION     Family History:  Family History  Problem Relation Age of Onset   High blood pressure Mother    Anxiety disorder Mother    Diabetes Maternal Grandmother    Family Psychiatric  History: No Known Diagnosis', Substance Abuse, or Suicides  Social History:  Social History   Substance and Sexual Activity  Alcohol Use Not Currently     Social History   Substance and Sexual Activity  Drug Use No    Social History   Socioeconomic History   Marital status: Single    Spouse name: Not on file   Number of children: 0   Years of education: Not on file   Highest education level: Not on file  Occupational  History   Occupation: Nature conservation officer     Comment: [employer]   Tobacco Use   Smoking status: Never   Smokeless tobacco: Never  Vaping Use   Vaping status: Never Used  Substance and Sexual Activity   Alcohol use: Not Currently   Drug use: No   Sexual activity: Not Currently  Other Topics Concern   Not on file  Social History Narrative   Not on file   Social Determinants of Health   Financial Resource Strain: Not on file   Food Insecurity: No Food Insecurity (04/06/2023)   Hunger Vital Sign    Worried About Running Out of Food in the Last Year: Never true    Ran Out of Food in the Last Year: Never true  Transportation Needs: No Transportation Needs (04/06/2023)   PRAPARE - Administrator, Civil Service (Medical): No    Lack of Transportation (Non-Medical): No  Physical Activity: Not on file  Stress: Not on file  Social Connections: Not on file   Additional Social History:                         Sleep: Good  Appetite:  Good  Current Medications: Current Facility-Administered Medications  Medication Dose Route Frequency Provider Last Rate Last Admin   acetaminophen (TYLENOL) tablet 650 mg  650 mg Oral Q6H PRN Dahlia Byes C, NP   650 mg at 04/07/23 0743   alum & mag hydroxide-simeth (MAALOX/MYLANTA) 200-200-20 MG/5ML suspension 30 mL  30 mL Oral Q4H PRN Dahlia Byes C, NP       atorvastatin (LIPITOR) tablet 40 mg  40 mg Oral QPM Onuoha, Josephine C, NP   40 mg at 04/07/23 1831   celecoxib (CELEBREX) capsule 200 mg  200 mg Oral BID Dahlia Byes C, NP   200 mg at 04/08/23 0739   doxycycline (VIBRA-TABS) tablet 100 mg  100 mg Oral Q12H Onuoha, Josephine C, NP   100 mg at 04/08/23 0739   gabapentin (NEURONTIN) capsule 300 mg  300 mg Oral QPM Onuoha, Josephine C, NP   300 mg at 04/07/23 1831   hydrOXYzine (ATARAX) tablet 50 mg  50 mg Oral BID PRN Dahlia Byes C, NP       lamoTRIgine (LAMICTAL) tablet 100 mg  100 mg Oral Daily Onuoha, Josephine C, NP   100 mg at 04/08/23 0739   magnesium hydroxide (MILK OF MAGNESIA) suspension 30 mL  30 mL Oral Daily PRN Dahlia Byes C, NP   30 mL at 04/06/23 1024   metFORMIN (GLUCOPHAGE) tablet 500 mg  500 mg Oral Q breakfast Dahlia Byes C, NP   500 mg at 04/08/23 0739   OLANZapine zydis (ZYPREXA) disintegrating tablet 10 mg  10 mg Oral QHS Onuoha, Josephine C, NP   10 mg at 04/07/23 2148   polyethylene glycol (MIRALAX /  GLYCOLAX) packet 17 g  17 g Oral Daily PRN Dahlia Byes C, NP   17 g at 04/07/23 1147    Lab Results:  Results for orders placed or performed during the hospital encounter of 04/06/23 (from the past 48 hour(s))  Comprehensive metabolic panel     Status: Abnormal   Collection Time: 04/07/23  6:42 AM  Result Value Ref Range   Sodium 135 135 - 145 mmol/L   Potassium 4.1 3.5 - 5.1 mmol/L   Chloride 105 98 - 111 mmol/L   CO2 20 (L) 22 - 32 mmol/L   Glucose, Bld 116 (  H) 70 - 99 mg/dL    Comment: Glucose reference range applies only to samples taken after fasting for at least 8 hours.   BUN 8 6 - 20 mg/dL   Creatinine, Ser 1.61 0.44 - 1.00 mg/dL   Calcium 9.1 8.9 - 09.6 mg/dL   Total Protein 7.6 6.5 - 8.1 g/dL   Albumin 3.9 3.5 - 5.0 g/dL   AST 15 15 - 41 U/L   ALT 16 0 - 44 U/L   Alkaline Phosphatase 93 38 - 126 U/L   Total Bilirubin 0.4 0.3 - 1.2 mg/dL   GFR, Estimated >04 >54 mL/min    Comment: (NOTE) Calculated using the CKD-EPI Creatinine Equation (2021)    Anion gap 10 5 - 15    Comment: Performed at [facility], [address], [place], [place] [address]  Hemoglobin A1c     Status: Abnormal   Collection Time: 04/07/23  6:42 AM  Result Value Ref Range   Hgb A1c MFr Bld 6.4 (H) 4.8 - 5.6 %    Comment: (NOTE) Pre diabetes:          5.7%-6.4%  Diabetes:              >6.4%  Glycemic control for   <7.0% adults with diabetes    Mean Plasma Glucose 136.98 mg/dL    Comment: Performed at [facility], [address] [place], [place] [address]  Lipid panel     Status: Abnormal   Collection Time: 04/07/23  6:42 AM  Result Value Ref Range   Cholesterol 123 0 - 200 mg/dL   Triglycerides 295 <621 mg/dL   HDL 39 (L) >30 mg/dL   Total CHOL/HDL Ratio 3.2 RATIO   VLDL 21 0 - 40 mg/dL   LDL Cholesterol 63 0 - 99 mg/dL    Comment:        Total Cholesterol/HDL:CHD Risk Coronary Heart Disease Risk Table                     Men   Women  1/2 Average Risk    3.4   3.3  Average Risk       5.0   4.4  2 X Average Risk   9.6   7.1  3 X Average Risk  23.4   11.0        Use the calculated Patient Ratio above and the CHD Risk Table to determine the patient's CHD Risk.        ATP III CLASSIFICATION (LDL):  <100     mg/dL   Optimal  865-784  mg/dL   Near or Above                    Optimal  130-159  mg/dL   Borderline  696-295  mg/dL   High  >284     mg/dL   Very High Performed at [facility], [address], [place], [place] [address]   Glucose, capillary     Status: Abnormal   Collection Time: 04/07/23 11:08 AM  Result Value Ref Range   Glucose-Capillary 100 (H) 70 - 99 mg/dL    Comment: Glucose reference range applies only to samples taken after fasting for at least 8 hours.  Glucose, capillary     Status: None   Collection Time: 04/08/23 11:49 AM  Result Value Ref Range   Glucose-Capillary 99 70 - 99 mg/dL    Comment: Glucose reference range  applies only to samples taken after fasting for at least 8 hours.    Blood Alcohol level:  Lab Results  Component Value Date   ETH <10 04/04/2023   ETH <5 09/09/2014    Metabolic Disorder Labs: Lab Results  Component Value Date   HGBA1C 6.4 (H) 04/07/2023   MPG 136.98 04/07/2023   MPG 116.89 05/02/2022   No results found for: "PROLACTIN" Lab Results  Component Value Date   CHOL 123 04/07/2023   TRIG 104 04/07/2023   HDL 39 (L) 04/07/2023   CHOLHDL 3.2 04/07/2023   VLDL 21 04/07/2023   LDLCALC 63 04/07/2023   LDLCALC 96 01/06/2020    Physical Findings: AIMS:  , ,  ,  ,    CIWA:    COWS:     Musculoskeletal: Strength & Muscle Tone: within normal limits Gait & Station: normal Patient leans: N/A  Psychiatric Specialty Exam:  Presentation  General Appearance:  Appropriate for Environment; Casual  Eye Contact: Good  Speech: Clear and Coherent; Normal Rate  Speech Volume: Normal  Handedness: Right   Mood and Affect  Mood: --  ("ok")  Affect: Appropriate; Congruent   Thought Process  Thought Processes: Coherent  Descriptions of Associations:Intact  Orientation:Full (Time, Place and Person)  Thought Content:Logical; WDL  History of Schizophrenia/Schizoaffective disorder:No data recorded Duration of Psychotic Symptoms:No data recorded Hallucinations:Hallucinations: None Description of Auditory Hallucinations: Hears deceased cousin occasionally Description of Visual Hallucinations: Sees deceased cousin occasionally  Ideas of Reference:None  Suicidal Thoughts:Suicidal Thoughts: No  Homicidal Thoughts:Homicidal Thoughts: No   Sensorium  Memory: Immediate Good; Recent Good  Judgment: Intact  Insight: Present   Executive Functions  Concentration: Good  Attention Span: Good  Recall: Good  Fund of Knowledge: Good  Language: Good   Psychomotor Activity  Psychomotor Activity: Psychomotor Activity: Normal   Assets  Assets: Communication Skills; Desire for Improvement; Social Support; Resilience   Sleep  Sleep: Sleep: Good Number of Hours of Sleep: 8    Physical Exam: Physical Exam Vitals and nursing note reviewed.  Constitutional:      General: She is not in acute distress.    Appearance: Normal appearance. She is obese. She is not ill-appearing or toxic-appearing.  HENT:     Head: Normocephalic and atraumatic.  Pulmonary:     Effort: Pulmonary effort is normal.  Musculoskeletal:        General: Normal range of motion.  Neurological:     General: No focal deficit present.     Mental Status: She is alert.    Review of Systems  Respiratory:  Negative for cough and shortness of breath.   Cardiovascular:  Negative for chest pain.  Gastrointestinal:  Negative for abdominal pain, constipation, diarrhea, nausea and vomiting.  Neurological:  Negative for dizziness, weakness and headaches.  Psychiatric/Behavioral:  Negative for depression, hallucinations and  suicidal ideas. The patient is not nervous/anxious.    Blood pressure 133/79, pulse (!) 113, temperature (!) 97.5 F (36.4 C), resp. rate 20, height 6' (1.829 m), weight (!) 152.4 kg, SpO2 99%. Body mass index is 45.57 kg/m.   Treatment Plan Summary: Daily contact with patient to assess and evaluate symptoms and progress in treatment and Medication management  Cindy Phillips is a 32 yr old female who presented on 8/15 to [facility] with SI with a plan to jump off a building, she was admitted to [facility] on 8/17. PPHx is significant for Schizoaffective Disorder, Bipolar Type, Anxiety, and Mild Intellectual Disability, and Multiple Psychiatric Hospitalizations (  last [facility] 08/2014), and no history of Suicide Attempts or Self Injurious Behavior.    Cindy Phillips has continued to improve out of the stressful environment that her house was.  Her cellulitis is improving as she is having no swelling and the pain is improving.  We will safety plan with her mother.  We will plan for discharge tomorrow.   Schizoaffective Disorder, Bipolar Type: -Continue Zyprexa 10 mg QHS for mood stability and psychosis -Continue Lamictal 100 mg daily for mood stability     Constipation: -Start Colace 100 mg BID for 3 doses -Continue Miralax 17 g daily PRN     Hypokalemia(resolved): -K: 4.1     -Continue Gabapentin 300 mg QHS -Continue Metformin 500 mg daily -Continue Lipitor 40 mg QHS -Continue Celebrex 200 mg BID -Continue Doxycycline 100 mg BID for cellulitis -Continue PRN's: Tylenol, Maalox, Atarax, Milk of Magnesia, Trazodone   Lauro Franklin, MD 04/08/2023, 11:58 AM

## 2023-04-08 NOTE — BH IP Treatment Plan (Signed)
Interdisciplinary Treatment and Diagnostic Plan Update  04/08/2023 Time of Session: [address] Cindy Phillips MRN: [id number]  Principal Diagnosis: Schizoaffective disorder, bipolar type [facility])  Secondary Diagnoses: Principal Problem:   Schizoaffective disorder, bipolar type (HCC)   Current Medications:  Current Facility-Administered Medications  Medication Dose Route Frequency Provider Last Rate Last Admin   acetaminophen (TYLENOL) tablet 650 mg  650 mg Oral Q6H PRN Dahlia Byes C, NP   650 mg at 04/07/23 0743   alum & mag hydroxide-simeth (MAALOX/MYLANTA) 200-200-20 MG/5ML suspension 30 mL  30 mL Oral Q4H PRN Dahlia Byes C, NP       atorvastatin (LIPITOR) tablet 40 mg  40 mg Oral QPM Onuoha, Josephine C, NP   40 mg at 04/07/23 1831   celecoxib (CELEBREX) capsule 200 mg  200 mg Oral BID Dahlia Byes C, NP   200 mg at 04/08/23 0739   doxycycline (VIBRA-TABS) tablet 100 mg  100 mg Oral Q12H Onuoha, Josephine C, NP   100 mg at 04/08/23 0739   gabapentin (NEURONTIN) capsule 300 mg  300 mg Oral QPM Onuoha, Josephine C, NP   300 mg at 04/07/23 1831   hydrOXYzine (ATARAX) tablet 50 mg  50 mg Oral BID PRN Dahlia Byes C, NP       lamoTRIgine (LAMICTAL) tablet 100 mg  100 mg Oral Daily Onuoha, Josephine C, NP   100 mg at 04/08/23 0739   magnesium hydroxide (MILK OF MAGNESIA) suspension 30 mL  30 mL Oral Daily PRN Dahlia Byes C, NP   30 mL at 04/06/23 1024   metFORMIN (GLUCOPHAGE) tablet 500 mg  500 mg Oral Q breakfast Onuoha, Josephine C, NP   500 mg at 04/08/23 0739   OLANZapine zydis (ZYPREXA) disintegrating tablet 10 mg  10 mg Oral QHS Onuoha, Josephine C, NP   10 mg at 04/07/23 2148   polyethylene glycol (MIRALAX / GLYCOLAX) packet 17 g  17 g Oral Daily PRN Dahlia Byes C, NP   17 g at 04/07/23 1147   PTA Medications: Medications Prior to Admission  Medication Sig Dispense Refill Last Dose   atorvastatin (LIPITOR) 40 MG tablet Take 40 mg by mouth every evening.       baclofen (LIORESAL) 10 MG tablet Take 10 mg by mouth once a week. Take 1 tablet by mouth twice weekly as needed      doxycycline (VIBRAMYCIN) 100 MG capsule Take 1 capsule (100 mg total) by mouth 2 (two) times daily. 20 capsule 0    gabapentin (NEURONTIN) 300 MG capsule Take 300 mg by mouth every evening.      hydrOXYzine (ATARAX/VISTARIL) 50 MG tablet Take 1 tablet (50 mg total) by mouth every 4 (four) hours as needed for anxiety. (Patient taking differently: Take 50 mg by mouth in the morning and at bedtime.) 45 tablet 0    lamoTRIgine (LAMICTAL) 100 MG tablet Take 100 mg by mouth daily.      LINZESS 72 MCG capsule Take 72 mcg by mouth every morning.      medroxyPROGESTERone (DEPO-PROVERA) 150 MG/ML injection Inject 1 mL (150 mg total) into the muscle every 3 (three) months. For prevention of pregnancy 1 mL     metFORMIN (GLUCOPHAGE) 500 MG tablet Take one tablet by mouth in the morning and one tablet by mouth in the evening (Patient taking differently: Take 500 mg by mouth daily with breakfast.) 60 tablet 0    OLANZapine zydis (ZYPREXA) 15 MG disintegrating tablet Take 1 tablet (15 mg total) by mouth  at bedtime. For mood control 30 tablet 0     Patient Stressors: Marital or family conflict    Patient Strengths: Printmaker for treatment/growth  Supportive family/friends   Treatment Modalities: Medication Management, Group therapy, Case management,  1 to 1 session with clinician, Psychoeducation, Recreational therapy.   Physician Treatment Plan for Primary Diagnosis: Schizoaffective disorder, bipolar type (HCC) Long Term Goal(s): Improvement in symptoms so as ready for discharge   Short Term Goals: Ability to identify changes in lifestyle to reduce recurrence of condition will improve Ability to verbalize feelings will improve Ability to disclose and discuss suicidal ideas Ability to demonstrate self-control will improve Ability to identify and develop effective  coping behaviors will improve  Medication Management: Evaluate patient's response, side effects, and tolerance of medication regimen.  Therapeutic Interventions: 1 to 1 sessions, Unit Group sessions and Medication administration.  Evaluation of Outcomes: Progressing  Physician Treatment Plan for Secondary Diagnosis: Principal Problem:   Schizoaffective disorder, bipolar type (HCC)  Long Term Goal(s): Improvement in symptoms so as ready for discharge   Short Term Goals: Ability to identify changes in lifestyle to reduce recurrence of condition will improve Ability to verbalize feelings will improve Ability to disclose and discuss suicidal ideas Ability to demonstrate self-control will improve Ability to identify and develop effective coping behaviors will improve     Medication Management: Evaluate patient's response, side effects, and tolerance of medication regimen.  Therapeutic Interventions: 1 to 1 sessions, Unit Group sessions and Medication administration.  Evaluation of Outcomes: Progressing   RN Treatment Plan for Primary Diagnosis: Schizoaffective disorder, bipolar type (HCC) Long Term Goal(s): Knowledge of disease and therapeutic regimen to maintain health will improve  Short Term Goals: Ability to remain free from injury will improve, Ability to verbalize frustration and anger appropriately will improve, Ability to demonstrate self-control, Ability to participate in decision making will improve, Ability to verbalize feelings will improve, Ability to disclose and discuss suicidal ideas, and Ability to identify and develop effective coping behaviors will improve  Medication Management: RN will administer medications as ordered by provider, will assess and evaluate patient's response and provide education to patient for prescribed medication. RN will report any adverse and/or side effects to prescribing provider.  Therapeutic Interventions: 1 on 1 counseling sessions,  Psychoeducation, Medication administration, Evaluate responses to treatment, Monitor vital signs and CBGs as ordered, Perform/monitor CIWA, COWS, AIMS and Fall Risk screenings as ordered, Perform wound care treatments as ordered.  Evaluation of Outcomes: Progressing   LCSW Treatment Plan for Primary Diagnosis: Schizoaffective disorder, bipolar type (HCC) Long Term Goal(s): Safe transition to appropriate next level of care at discharge, Engage patient in therapeutic group addressing interpersonal concerns.  Short Term Goals: Engage patient in aftercare planning with referrals and resources, Increase social support, Increase ability to appropriately verbalize feelings, Increase emotional regulation, Facilitate acceptance of mental health diagnosis and concerns, Facilitate patient progression through stages of change regarding substance use diagnoses and concerns, Identify triggers associated with mental health/substance abuse issues, and Increase skills for wellness and recovery  Therapeutic Interventions: Assess for all discharge needs, 1 to 1 time with Social worker, Explore available resources and support systems, Assess for adequacy in community support network, Educate family and significant other(s) on suicide prevention, Complete Psychosocial Assessment, Interpersonal group therapy.  Evaluation of Outcomes: Progressing   Progress in Treatment: Attending groups: Yes. Participating in groups: Yes. Taking medication as prescribed: Yes. Toleration medication: Yes. Family/Significant other contact made: No, will contact:  attempt made  did not answer Patient understands diagnosis: Yes. Discussing patient identified problems/goals with staff: Yes. Medical problems stabilized or resolved: Yes. Denies suicidal/homicidal ideation: Yes. Issues/concerns per patient self-inventory: No. Other:   New problem(s) identified: No, Describe:  none reported  New Short Term/Long Term Goal(s):medication  management for mood stabilization; elimination of SI thoughts; development of comprehensive mental wellness  Patient Goals:  work on anger, coping skills  Discharge Plan or Barriers: Patient recently admitted. CSW will continue to follow and assess for appropriate referrals and possible discharge planning.  Reason for Continuation of Hospitalization: Aggression Delusions  Medication stabilization  Estimated Length of Stay:  Last 3 [place] Suicide Severity Risk Score: Flowsheet Row [facility] (Current) [facility] from [facility]20[facility] at [facility] from 03/31/2023 in [facility] Department at [facility]  C-SSRS RISK CATEGORY High Risk High Risk No Risk       Last [facility] 2/9 Scores:    04/10/2021    2:53 PM 01/21/2020    3:15 PM 11/26/2017    8:14 AM  Depression screen PHQ 2/9  Decreased Interest 0 0 0  Down, Depressed, Hopeless 1 2 2   PHQ - 2 Score 1 2 2   Altered sleeping 1 1 3   Tired, decreased energy 1 3 3   Change in appetite 1 1 2   Feeling bad or failure about yourself  0 0 0  Trouble concentrating 0 1 3  Moving slowly or fidgety/restless 0 0 2  Suicidal thoughts 0 0 3  PHQ-9 Score 4 8 18   Difficult doing work/chores Somewhat difficult Somewhat difficult Very difficult    Scribe for Treatment Team: Charise Killian 04/08/2023 2:09 PM

## 2023-04-08 NOTE — Plan of Care (Signed)
  Problem: Education: Goal: Emotional status will improve Outcome: Progressing Goal: Mental status will improve Outcome: Progressing   

## 2023-04-08 NOTE — Group Note (Signed)
Date:  04/08/2023 Time:  6:07 PM  Group Topic/Focus:  Goals Group:   The focus of this group is to help patients establish daily goals to achieve during treatment and discuss how the patient can incorporate goal setting into their daily lives to aide in recovery.    Participation Level:  Did Not Attend    [name] 04/08/2023, 6:07 PM

## 2023-04-08 NOTE — Group Note (Signed)
Recreation Therapy Group Note   Group Topic:Stress Management  Group Date: 04/08/2023 Start Time: 0930 End Time: 1000 Facilitators: Dez Stauffer-McCall, LRT,CTRS Location: [address] Dayroom   Goal Area(s) Addresses:  Patient will actively participate in stress management techniques presented during session.  Patient will successfully identify benefit of practicing stress management post d/c.   Group Description:  Guided Imagery. LRT provided education, instruction, and demonstration on practice of visualization via guided imagery. Patient was asked to participate in the technique introduced during session. LRT debriefed including topics of mindfulness, stress management and specific scenarios each patient could use these techniques. Patients were given suggestions of ways to access scripts post d/c and encouraged to explore Youtube and other apps available on smartphones, tablets, and computers.   Clinical Observations/Individualized Feedback: Unable to conduct group session due to maintenance being completed in dayroom.     Plan: Continue to engage patient in RT group sessions 2-3x/week.   Jase Reep-McCall, LRT,CTRS 04/08/2023 11:47 AM

## 2023-04-08 NOTE — [facility] Group Notes (Signed)
Adult Psychoeducational Group Note  Date:  04/08/2023 Time:  10:04 PM  Group Topic/Focus:  Wrap-Up Group:   The focus of this group is to help patients review their daily goal of treatment and discuss progress on daily workbooks.  Participation Level:  Active  Participation Quality:  Attentive  Affect:  Appropriate  Cognitive:  Alert  Insight: Appropriate  Engagement in Group:  Engaged  Modes of Intervention:  Discussion  Additional Comments:  Pt attended group  Maura Crandall Cassandra 04/08/2023, 10:04 PM

## 2023-04-09 DIAGNOSIS — F250 Schizoaffective disorder, bipolar type: Secondary | ICD-10-CM

## 2023-04-09 LAB — GLUCOSE, CAPILLARY: Glucose-Capillary: 105 mg/dL — ABNORMAL HIGH (ref 70–99)

## 2023-04-09 MED ORDER — OLANZAPINE 10 MG PO TBDP
10.0000 mg | ORAL_TABLET | Freq: Every day | ORAL | 0 refills | Status: AC
Start: 2023-04-09 — End: ?

## 2023-04-09 MED ORDER — HYDROXYZINE HCL 50 MG PO TABS: 50.0000 mg | ORAL_TABLET | Freq: Two times a day (BID) | ORAL | Status: DC | PRN

## 2023-04-09 NOTE — [facility] Group Notes (Signed)

## 2023-04-09 NOTE — [facility] Suicide Risk Assessment (Addendum)
Suicide Risk Assessment  Discharge Assessment    [facility] Discharge Suicide Risk Assessment   Principal Problem: Schizoaffective disorder, bipolar type [facility]) Discharge Diagnoses: Principal Problem:   Schizoaffective disorder, bipolar type (HCC)  During the patient's hospitalization, patient had extensive initial psychiatric evaluation, and follow-up psychiatric evaluations every day.  Psychiatric diagnoses provided upon initial assessment: Schizoaffective Disorder, Bipolar Type, Anxiety, and Mild Intellectual Disability.  Patient's psychiatric medications were adjusted on admission: None  During the hospitalization, other adjustments were made to the patient's psychiatric medication regimen: No changes were made during her hospitalization.  Gradually, patient started adjusting to milieu.   Patient's care was discussed during the interdisciplinary team meeting every day during the hospitalization.  The patient is not having side effects to prescribed psychiatric medication.  The patient reports their target psychiatric symptoms of depression and SI responded well to the psychiatric medications, and the patient reports overall benefit other psychiatric hospitalization. Supportive psychotherapy was provided to the patient. The patient also participated in regular group therapy while admitted.   Labs were reviewed with the patient, and abnormal results were discussed with the patient.  The patient denied having suicidal thoughts more than 48 hours prior to discharge.  Patient denies having homicidal thoughts.  Patient denies having auditory hallucinations.  Patient denies any visual hallucinations.  Patient denies having paranoid thoughts.  The patient is able to verbalize their individual safety plan to this provider.  It is recommended to the patient to continue psychiatric medications as prescribed, after discharge from the hospital.    It is recommended to the patient to follow up with  your outpatient psychiatric provider and PCP.  Discussed with the patient, the impact of alcohol, drugs, tobacco have been there overall psychiatric and medical wellbeing, and total abstinence from substance use was recommended the patient.   Total Time spent with patient: 20 minutes  Musculoskeletal: Strength & Muscle Tone: within normal limits Gait & Station: normal Patient leans: N/A  Psychiatric Specialty Exam  Presentation  General Appearance:  Appropriate for Environment; Casual  Eye Contact: Good  Speech: Clear and Coherent; Normal Rate  Speech Volume: Normal  Handedness: Right   Mood and Affect  Mood: -- ("good")  Duration of Depression Symptoms: No data recorded Affect: Appropriate; Congruent   Thought Process  Thought Processes: Coherent; Goal Directed  Descriptions of Associations:Intact  Orientation:Full (Time, Place and Person)  Thought Content:Logical; WDL  History of Schizophrenia/Schizoaffective disorder:No data recorded Duration of Psychotic Symptoms:No data recorded Hallucinations:Hallucinations: None  Ideas of Reference:None  Suicidal Thoughts:Suicidal Thoughts: No  Homicidal Thoughts:Homicidal Thoughts: No   Sensorium  Memory: Immediate Good; Recent Good  Judgment: Intact  Insight: Present   Executive Functions  Concentration: Good  Attention Span: Good  Recall: Good  Fund of Knowledge: Good  Language: Good   Psychomotor Activity  Psychomotor Activity: Psychomotor Activity: Normal   Assets  Assets: Communication Skills; Desire for Improvement; Resilience; Housing; Social Support   Sleep  Sleep: Sleep: Good Number of Hours of Sleep: 9.75   Physical Exam: Physical Exam Vitals and nursing note reviewed.  Constitutional:      General: She is not in acute distress.    Appearance: Normal appearance. She is obese. She is not ill-appearing or toxic-appearing.  HENT:     Head: Normocephalic and  atraumatic.  Pulmonary:     Effort: Pulmonary effort is normal.  Musculoskeletal:        General: Normal range of motion.  Neurological:     General: No focal  deficit present.     Mental Status: She is alert.    Review of Systems  Respiratory:  Negative for cough and shortness of breath.   Cardiovascular:  Negative for chest pain.  Gastrointestinal:  Negative for abdominal pain, constipation, diarrhea, nausea and vomiting.  Neurological:  Negative for dizziness, weakness and headaches.  Psychiatric/Behavioral:  Negative for depression, hallucinations and suicidal ideas. The patient is not nervous/anxious.    Blood pressure 101/71, pulse (!) 104, temperature 98.2 F (36.8 C), temperature source Oral, resp. rate 20, height 6' (1.829 m), weight (!) 152.4 kg, SpO2 100%. Body mass index is 45.57 kg/m.  Mental Status Per Nursing Assessment::   On Admission:  Suicidal ideation indicated by patient  Demographic Factors:  Unemployed  Loss Factors: Decline in physical health  Historical Factors: Impulsivity  Risk Reduction Factors:   Living with another person, especially a relative, Positive social support, and Positive coping skills or problem solving skills  Continued Clinical Symptoms:  More than one psychiatric diagnosis Medical Diagnoses and Treatments/Surgeries  Cognitive Features That Contribute To Risk:  Loss of executive function    Suicide Risk:  Minimal: No identifiable suicidal ideation.  Patients presenting with no risk factors but with morbid ruminations; may be classified as minimal risk based on the severity of the depressive symptoms   Follow-up Information     [facility] Go on 04/12/2023.   Specialty: Professional Counselor Why: Please call to schedule an appointment for therapy services with your provider on 04/12/23 at 11:00 am, in person. Contact information: [address] [place] [address] [phone]         [facility] Call.   Why: You may call to schedule an appointment with this provider, if you need medication management services. Contact information: [address] [place] [address] [phone]                 Plan Of Care/Follow-up recommendations:  Activity: as tolerated  Diet: heart healthy  Other: -Follow-up with your outpatient psychiatric provider -instructions on appointment date, time, and address (location) are provided to you in discharge paperwork.  -Take your psychiatric medications as prescribed at discharge - instructions are provided to you in the discharge paperwork  -Follow-up with outpatient primary care doctor and other specialists -for management of chronic medical disease, including: Elevated A1c. Continued follow up/care for your Cellulitis of your leg.  -Testing: Follow-up with outpatient provider for abnormal lab results: Elevated A1c.  -Recommend abstinence from alcohol, tobacco, and other illicit drug use at discharge.   -If your psychiatric symptoms recur, worsen, or if you have side effects to your psychiatric medications, call your outpatient psychiatric provider, 911, [phone] or go to the nearest emergency department.  -If suicidal thoughts recur, call your outpatient psychiatric provider, 911, [phone] or go to the nearest emergency department.   Lauro Franklin, MD 04/09/2023, 9:19 AM

## 2023-04-09 NOTE — Group Note (Signed)
[facility] LCSW Group Therapy Note   Group Date: 04/09/2023 Start Time: 1100 End Time: 1140   Type of Therapy/Topic:  Group Therapy:  Emotion Regulation  Participation Level:  Minimal   Mood:  Description of Group:    The purpose of this group is to assist patients in learning to regulate negative emotions and experience positive emotions. Patients will be guided to discuss ways in which they have been vulnerable to their negative emotions. These vulnerabilities will be juxtaposed with experiences of positive emotions or situations, and patients challenged to use positive emotions to combat negative ones. Special emphasis will be placed on coping with negative emotions in conflict situations, and patients will process healthy conflict resolution skills.  Therapeutic Goals: Patient will identify two positive emotions or experiences to reflect on in order to balance out negative emotions:  Patient will label two or more emotions that they find the most difficult to experience:  Patient will be able to demonstrate positive conflict resolution skills through discussion or role plays:   Summary of Patient Progress:   Patient was present in group the entire the time, but had minimal participation.     Therapeutic Modalities:   Cognitive Behavioral Therapy Feelings Identification Dialectical Behavioral Therapy   Starleen Arms, LCSW

## 2023-04-09 NOTE — Progress Notes (Signed)
   04/09/23 0800  Psych Admission Type (Psych Patients Only)  Admission Status Voluntary  Psychosocial Assessment  Patient Complaints None  Eye Contact Fair  Facial Expression Animated  Affect Appropriate to circumstance  Speech Logical/coherent  Interaction Assertive  Motor Activity Fidgety  Appearance/Hygiene Unremarkable  Behavior Characteristics Cooperative;Appropriate to situation  Mood Pleasant  Thought Process  Coherency WDL  Content WDL  Delusions None reported or observed  Perception WDL  Hallucination None reported or observed  Judgment Impaired  Confusion None  Danger to Self  Current suicidal ideation? Denies  Self-Injurious Behavior No self-injurious ideation or behavior indicators observed or expressed   Agreement Not to Harm Self Yes  Description of Agreement verbal  Danger to Others  Danger to Others None reported or observed
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
## 2023-04-09 NOTE — Progress Notes (Signed)
  [facility] Adult Case Management Discharge Plan :  Will you be returning to the same living situation after discharge:  Yes,  home with mother At discharge, do you have transportation home?: Yes,  mother Do you have the ability to pay for your medications: Yes,  medicare  Release of information consent forms completed and in the chart;  Patient's signature needed at discharge.  Patient to Follow up at:  Follow-up Information     [facility] Go on 04/12/2023.   Specialty: Professional Counselor Why: Please call to schedule an appointment for therapy services with your provider on 04/12/23 at 11:00 am, in person. Contact information: [address] [place] [address] [phone]         [facility] Call.   Why: You may call to schedule an appointment with this provider, if you need medication management services. Contact information: [address] [place] [address] [phone]                 Next level of care provider has access to [facility] Link:no  Safety Planning and Suicide Prevention discussed: Yes,  [name] mom     Has patient been referred to the Quitline?: Patient does not use tobacco/nicotine products  Patient has been referred for addiction treatment: No known substance use disorder.  Starleen Arms, LCSW 04/09/2023, 9:37 AM

## 2023-04-09 NOTE — Progress Notes (Signed)
Discharge Note:  Patient denies SI/HI/AVH at this time. Discharge instructions, AVS, prescriptions, and transition record gone over with patient. Patient agrees to comply with medication management, follow-up visit, and outpatient therapy. Patient belongings returned to patient. Patient questions and concerns addressed and answered. Patient ambulatory off unit. Patient discharged to home with mom.

## 2023-04-09 NOTE — Progress Notes (Signed)
   04/09/23 0553  15 Minute Checks  Location Bedroom  Visual Appearance Calm  Behavior Sleeping  Sleep (Behavioral Health Patients Only)  Calculate sleep? (Click Yes once per 24 hr at 0600 safety check) Yes  Documented sleep last 24 hours 9.75

## 2023-04-15 DIAGNOSIS — M79604 Pain in right leg: Secondary | ICD-10-CM | POA: Diagnosis not present

## 2023-04-15 DIAGNOSIS — E1169 Type 2 diabetes mellitus with other specified complication: Secondary | ICD-10-CM | POA: Diagnosis not present

## 2023-04-25 ENCOUNTER — Ambulatory Visit (INDEPENDENT_AMBULATORY_CARE_PROVIDER_SITE_OTHER): Payer: 59 | Admitting: Podiatry

## 2023-04-25 ENCOUNTER — Ambulatory Visit (INDEPENDENT_AMBULATORY_CARE_PROVIDER_SITE_OTHER): Payer: 59

## 2023-04-25 DIAGNOSIS — M25471 Effusion, right ankle: Secondary | ICD-10-CM

## 2023-04-25 DIAGNOSIS — R609 Edema, unspecified: Secondary | ICD-10-CM | POA: Diagnosis not present

## 2023-04-25 NOTE — Progress Notes (Signed)
Subjective:   Patient ID: Cindy Phillips, female   DOB: 32 y.o.   MRN: [id number]   HPI Chief Complaint  Patient presents with   Joint Swelling    swelling in the right ankle    History of Present Illness   A 32 year old patient with a history of diabetes presents with persistent swelling of the right ankle, particularly on the lateral aspect. The swelling is present on both sides of the ankle, with no recollection of recent injuries or treatments that may have caused this. The patient's diabetes is relatively well-controlled, with the most recent A1c being 6.4. Despite the diabetes, the patient denies any ulcerations. The patient also denies experiencing any chest pain or shortness of breath. The swelling is not associated with any pain, erythema, or warmth, and there are no open lesions or ulcerations identified. The patient reports no other concerns at this time.       Review of Systems  All other systems reviewed and are negative.   Past Medical History:  Diagnosis Date   Arthritis    Bipolar 1 disorder (HCC)    Bowel incontinence    Depression    Diabetes mellitus without complication (HCC)    Headache    migraines   Mental disability    Sleep apnea     Past Surgical History:  Procedure Laterality Date   ANTERIOR CRUCIATE LIGAMENT REPAIR Right 05/11/2022   Procedure: RIGHT KNEE ARTHROSCOPY; LATERAL MENISECTOMY; ANTERIOR CRUCIATE LIGAMENT (ACL) REPAIR; CHONDROPLASTY;  Surgeon: Frederico Hamman, MD;  Location: [facility];  Service: Orthopedics;  Laterality: Right;   FOOT SURGERY     KNEE ARTHROSCOPY W/ ACL RECONSTRUCTION Left 10/12/2020   TOOTH EXTRACTION       Current Outpatient Medications:    atorvastatin (LIPITOR) 40 MG tablet, Take 40 mg by mouth every evening., Disp: , Rfl:    doxycycline (VIBRAMYCIN) 100 MG capsule, Take 1 capsule (100 mg total) by mouth 2 (two) times daily., Disp: 20 capsule, Rfl: 0   gabapentin (NEURONTIN) 300 MG capsule, Take 300 mg by mouth  every evening., Disp: , Rfl:    hydrOXYzine (ATARAX) 50 MG tablet, Take 1 tablet (50 mg total) by mouth 2 (two) times daily as needed for anxiety., Disp: , Rfl:    lamoTRIgine (LAMICTAL) 100 MG tablet, Take 100 mg by mouth daily., Disp: , Rfl:    LINZESS 72 MCG capsule, Take 72 mcg by mouth every morning., Disp: , Rfl:    medroxyPROGESTERone (DEPO-PROVERA) 150 MG/ML injection, Inject 1 mL (150 mg total) into the muscle every 3 (three) months. For prevention of pregnancy, Disp: 1 mL, Rfl:    metFORMIN (GLUCOPHAGE) 500 MG tablet, Take one tablet by mouth in the morning and one tablet by mouth in the evening (Patient taking differently: Take 500 mg by mouth daily with breakfast.), Disp: 60 tablet, Rfl: 0   OLANZapine zydis (ZYPREXA) 10 MG disintegrating tablet, Take 1 tablet (10 mg total) by mouth at bedtime., Disp: 30 tablet, Rfl: 0  No Known Allergies        Objective:  Physical Exam  Physical Exam   GENERAL: Awake and oriented times three. CARDIOVASCULAR: Palpable pulses bilaterally, immediate capillary refill time for all digits. MUSCULOSKELETAL: Edema present on the ankles, muscle, and lateral aspect.  There is no pain.. There is mild pitting edema present in the leg,. The calf supple, no pain with calf compression. No areas of pinpoint tenderness. SKIN: No open lesions, edema present without erythema or warmth, no areas  of fluctuation or cavitation, no ulcerations identified.          Assessment and Plan    Right Ankle Swelling Chronic bilateral ankle swelling, worse on the right, predominantly on the lateral aspect. No recent injuries reported. No signs of DVT, erythema, warmth, fluctuation, cavitation, or ulcerations. -Recommend follow-up with primary care doctor for further evaluation of chronic swelling.  Type 2 Diabetes Mellitus Last A1c was 6.4 on 04/07/2023. No reported diabetic ulcerations. -Continue current diabetes management. -Follow with PCP for this.       Vivi Barrack DPM

## 2023-04-29 DIAGNOSIS — E1169 Type 2 diabetes mellitus with other specified complication: Secondary | ICD-10-CM | POA: Diagnosis not present

## 2023-04-29 DIAGNOSIS — R600 Localized edema: Secondary | ICD-10-CM | POA: Diagnosis not present

## 2023-05-01 ENCOUNTER — Ambulatory Visit (HOSPITAL_COMMUNITY)
Admission: RE | Admit: 2023-05-01 | Discharge: 2023-05-01 | Disposition: A | Discharge status: Home / Self Care | Payer: 59 | Source: Ambulatory Visit | Attending: Podiatry | Admitting: Podiatry

## 2023-05-01 DIAGNOSIS — R609 Edema, unspecified: Secondary | ICD-10-CM | POA: Diagnosis not present

## 2023-05-06 DIAGNOSIS — M542 Cervicalgia: Secondary | ICD-10-CM | POA: Diagnosis not present

## 2023-05-06 DIAGNOSIS — M791 Myalgia, unspecified site: Secondary | ICD-10-CM | POA: Diagnosis not present

## 2023-05-06 DIAGNOSIS — G43719 Chronic migraine without aura, intractable, without status migrainosus: Secondary | ICD-10-CM | POA: Diagnosis not present

## 2023-05-09 ENCOUNTER — Ambulatory Visit (HOSPITAL_COMMUNITY): Admission: EM | Admit: 2023-05-09 | Discharge: 2023-05-09 | Discharge status: Against Medical Advice | Payer: 59

## 2023-05-09 ENCOUNTER — Ambulatory Visit (HOSPITAL_COMMUNITY)
Admission: EM | Admit: 2023-05-09 | Discharge: 2023-05-09 | Disposition: A | Discharge status: Home / Self Care | Payer: 59

## 2023-05-09 NOTE — Progress Notes (Signed)
   05/09/23 1733  [facility] Triage Screening (Walk-ins at [facility] only)  How Did You Hear About [place]? Legal System  What Is the Reason for Your Visit/Call Today? Pt arrived voluntarily to [facility] via [employer]. Pt refused to complete triage and left the facility AMA.  How Long Has This Been Causing You Problems?  (UTA)  Have You Recently Had Any Thoughts About Hurting Yourself?  (UTA)  Are You Planning to Commit Suicide/Harm Yourself At This time?  (UTA)  Have you Recently Had Thoughts About Hurting Someone Else?  (UTA)  Are You Planning To Harm Someone At This Time?  (UTA)  Are you currently experiencing any auditory, visual or other hallucinations?  (UTA)  Have You Used Any Alcohol or Drugs in the Past 24 Hours?  (UTA)  Do you have any current medical co-morbidities that require immediate attention?  (UTA)

## 2023-05-09 NOTE — Progress Notes (Signed)
   05/09/23 1824  [facility] Triage Screening (Walk-ins at [facility] only)  How Did You Hear About [place]? Legal System  What Is the Reason for Your Visit/Call Today? Pt returned voluntarily to [facility] via [employer]. Pt states that she doesn't want to talk to this writer or any provider.  How Long Has This Been Causing You Problems?  (UTA)  Have You Recently Had Any Thoughts About Hurting Yourself?  (UTA)  Are You Planning to Commit Suicide/Harm Yourself At This time?  (UTA)  Have you Recently Had Thoughts About Hurting Someone Else?  (UTA)  Are You Planning To Harm Someone At This Time?  (UTA)  Are you currently experiencing any auditory, visual or other hallucinations?  (UTA)  Have You Used Any Alcohol or Drugs in the Past 24 Hours?  (UTA)  Do you have any current medical co-morbidities that require immediate attention?  (UTA)  Clinician description of patient physical appearance/behavior: tearful

## 2023-05-09 NOTE — ED Notes (Signed)
Patient became visibly upset while in the assessment room while waiting on a provider to come see her. This nurse advised the patient that the providers will be coming in about 10 to 15 minutes and asked her if she would like to continue waiting. I did apologize for the inconvenience. The patient said she was not willing to wait and wanted to leave now. This nurse advised the patient that since she is leaving prior to being seen by a provider it is considered leaving against medical advice and as a result there is a form that we need her to sign. Still visibly upset and now slamming her hands on the table the patient stated she is not signing anything. This nurse asked the patient again if she would sign the AMA form. Patients response was still no! [place] security asked the patient if she was sure about leaving and refused to sign the form. The patient responded she did want to leave and was not signing any forms. This nurse, [place] security, [employer] security, and [employer] Darlene were all witnesses. Because the patient is not IVC security walked the patient to locker to get her belongings and patient left.

## 2023-05-09 NOTE — ED Provider Notes (Signed)
This provider was asked to come to the sally port to assess patient as she was refusing vital signs and treatment on arrival. On evaluation, patient was seated in no acute distress with law enforcement present. Patient refused to participate in the assessment and stated, "I am not talking to anyone but [name]." I spoke to the police officer outside the door. The officer states that he does not know who Cindy Phillips is. The police officer states that the patient requested to come here to talk to someone. He states that the patient was damaging property at her residence. He states that the patient is voluntary. I discussed with the officer that the patient would need to be placed under IVC if she presented as a danger to herself or others. The officer states that he doesn't think he has enough information to IVC the patient. Patient to be released back into police custody.

## 2023-05-10 ENCOUNTER — Other Ambulatory Visit: Payer: Self-pay

## 2023-05-10 ENCOUNTER — Emergency Department (HOSPITAL_COMMUNITY)
Admission: EM | Admit: 2023-05-10 | Discharge: 2023-05-12 | Disposition: A | Discharge status: Other Acute Inpatient Hospital | Payer: 59 | Attending: Emergency Medicine | Admitting: Emergency Medicine

## 2023-05-10 DIAGNOSIS — F250 Schizoaffective disorder, bipolar type: Secondary | ICD-10-CM | POA: Diagnosis not present

## 2023-05-10 DIAGNOSIS — R45851 Suicidal ideations: Secondary | ICD-10-CM | POA: Insufficient documentation

## 2023-05-10 DIAGNOSIS — R45850 Homicidal ideations: Secondary | ICD-10-CM | POA: Insufficient documentation

## 2023-05-10 DIAGNOSIS — R454 Irritability and anger: Secondary | ICD-10-CM

## 2023-05-10 DIAGNOSIS — F79 Unspecified intellectual disabilities: Secondary | ICD-10-CM | POA: Diagnosis present

## 2023-05-10 DIAGNOSIS — E119 Type 2 diabetes mellitus without complications: Secondary | ICD-10-CM | POA: Diagnosis not present

## 2023-05-10 DIAGNOSIS — Z7984 Long term (current) use of oral hypoglycemic drugs: Secondary | ICD-10-CM | POA: Diagnosis not present

## 2023-05-10 DIAGNOSIS — R9431 Abnormal electrocardiogram [ECG] [EKG]: Secondary | ICD-10-CM | POA: Diagnosis not present

## 2023-05-10 LAB — CBC
HCT: 36.6 % (ref 36.0–46.0)
Hemoglobin: 12 g/dL (ref 12.0–15.0)
MCH: 28.8 pg (ref 26.0–34.0)
MCHC: 32.8 g/dL (ref 30.0–36.0)
MCV: 88 fL (ref 80.0–100.0)
Platelets: 226 10*3/uL (ref 150–400)
RBC: 4.16 MIL/uL (ref 3.87–5.11)
RDW: 13.9 % (ref 11.5–15.5)
WBC: 7.7 10*3/uL (ref 4.0–10.5)
nRBC: 0 % (ref 0.0–0.2)

## 2023-05-10 LAB — COMPREHENSIVE METABOLIC PANEL
ALT: 15 U/L (ref 0–44)
AST: 24 U/L (ref 15–41)
Albumin: 4.4 g/dL (ref 3.5–5.0)
Alkaline Phosphatase: 98 U/L (ref 38–126)
Anion gap: 11 (ref 5–15)
BUN: 9 mg/dL (ref 6–20)
CO2: 22 mmol/L (ref 22–32)
Calcium: 9.6 mg/dL (ref 8.9–10.3)
Chloride: 107 mmol/L (ref 98–111)
Creatinine, Ser: 0.68 mg/dL (ref 0.44–1.00)
GFR, Estimated: >60 mL/min (ref >60)
Glucose, Bld: 89 mg/dL (ref 70–99)
Potassium: 3.3 mmol/L — ABNORMAL LOW (ref 3.5–5.1)
Sodium: 140 mmol/L (ref 135–145)
Total Bilirubin: 1.1 mg/dL (ref 0.3–1.2)
Total Protein: 8.5 g/dL — ABNORMAL HIGH (ref 6.5–8.1)

## 2023-05-10 LAB — RAPID URINE DRUG SCREEN, HOSP PERFORMED
Amphetamines: NOT DETECTED
Barbiturates: NOT DETECTED
Benzodiazepines: NOT DETECTED
Cocaine: NOT DETECTED
Opiates: NOT DETECTED
Tetrahydrocannabinol: NOT DETECTED

## 2023-05-10 LAB — SALICYLATE LEVEL: Salicylate Lvl: <7 mg/dL — ABNORMAL LOW (ref 7.0–30.0)

## 2023-05-10 LAB — PREGNANCY, URINE: Preg Test, Ur: NEGATIVE

## 2023-05-10 LAB — ACETAMINOPHEN LEVEL: Acetaminophen (Tylenol), Serum: <10 ug/mL — ABNORMAL LOW (ref 10–30)

## 2023-05-10 LAB — ETHANOL: Alcohol, Ethyl (B): <10 mg/dL (ref <10)

## 2023-05-10 MED ORDER — POTASSIUM CHLORIDE CRYS ER 20 MEQ PO TBCR
20.0000 meq | EXTENDED_RELEASE_TABLET | Freq: Once | ORAL | Status: AC
  Administered 2023-05-10: 20 meq via ORAL
  Filled 2023-05-10: qty 1

## 2023-05-10 NOTE — ED Provider Notes (Signed)
This provider attempted to speak with patient, introduced self to patient for assessment, patient stated to provider "do not come asking me any dumb questions ".  Patient stated she was not in the mood to answer them questions, said that she had gotten to an altercation with family and was already in a bad mood.  Provider attempted to address the situation with family members, patient stated I do not feel like talking and dismissed provider. Will have night shift provider attempt to reassess.

## 2023-05-10 NOTE — ED Notes (Signed)
Patient has 2 bags of belongings that were placed in locker #39 in TCU

## 2023-05-10 NOTE — ED Triage Notes (Signed)
Pt arrived via POV. C/O SI, and HI since yesterday. Pt had the cops called on them d/t fighting with their family. Pt states they want to jump off a bridge  AOx4

## 2023-05-10 NOTE — ED Provider Notes (Signed)
[place] EMERGENCY DEPARTMENT AT [facility] Provider Note   CSN: [id number] Arrival date & time: 05/10/23  1617     History  Chief Complaint  Patient presents with   Suicidal   Homicidal    Cindy Phillips is a 32 y.o. female with history of depression, bipolar 1 disorder, mental disability, diabetes, sleep apnea, migraines, who presents to the emergency department with suicidal and homicidal ideations.  Symptoms started yesterday, but patient has struggled with them in the past as she reports.  She got an argument with her mother, and has some scratches on her right arm.  The police were called due to this altercation.  Patient complaining of wanting to harm her family members, and wanting to harm herself by jumping off a bridge.  States that she is on several medications for mental health conditions, has not taken them in "a while", but specifies she has not taken them since yesterday.  Not complaining of any other symptoms.  Denies AVH.  HPI     Home Medications Prior to Admission medications   Medication Sig Start Date End Date Taking? Authorizing Provider  atorvastatin (LIPITOR) 40 MG tablet Take 40 mg by mouth every evening.    [provider]  doxycycline (VIBRAMYCIN) 100 MG capsule Take 1 capsule (100 mg total) by mouth 2 (two) times daily. 03/31/23   Small, Brooke L, PA  gabapentin (NEURONTIN) 300 MG capsule Take 300 mg by mouth every evening. 04/17/22   [provider]  hydrOXYzine (ATARAX) 50 MG tablet Take 1 tablet (50 mg total) by mouth 2 (two) times daily as needed for anxiety. 04/09/23   Lauro Franklin, MD  lamoTRIgine (LAMICTAL) 100 MG tablet Take 100 mg by mouth daily.    [provider]  LINZESS 72 MCG capsule Take 72 mcg by mouth every morning. 04/02/23   [provider]  medroxyPROGESTERone (DEPO-PROVERA) 150 MG/ML injection Inject 1 mL (150 mg total) into the muscle every 3 (three) months. For prevention of  pregnancy 09/14/14   Armandina Stammer I, NP  metFORMIN (GLUCOPHAGE) 500 MG tablet Take one tablet by mouth in the morning and one tablet by mouth in the evening Patient taking differently: Take 500 mg by mouth daily with breakfast. 03/21/20   Quillian Quince D, MD  OLANZapine zydis (ZYPREXA) 10 MG disintegrating tablet Take 1 tablet (10 mg total) by mouth at bedtime. 04/09/23   Lauro Franklin, MD      Allergies    Patient has no known allergies.    Review of Systems   Review of Systems  Psychiatric/Behavioral:  Positive for suicidal ideas.   All other systems reviewed and are negative.   Physical Exam Updated Vital Signs BP (!) 153/111 (BP Location: Left Arm)   Pulse (!) 110   Temp 98.2 F (36.8 C) (Oral)   Resp 18   Ht 6' (1.829 m)   Wt (!) 154.2 kg   SpO2 100%   BMI 46.11 kg/m  Physical Exam Vitals and nursing note reviewed.  Constitutional:      Appearance: Normal appearance.  HENT:     Head: Normocephalic and atraumatic.  Eyes:     Conjunctiva/sclera: Conjunctivae normal.  Pulmonary:     Effort: Pulmonary effort is normal. No respiratory distress.  Skin:    General: Skin is warm and dry.     Comments: Superficial laceration noted to the  Neurological:     Mental Status: She is alert.  Psychiatric:  Mood and Affect: Mood normal.        Behavior: Behavior normal.     ED Results / Procedures / Treatments   Labs (all labs ordered are listed, but only abnormal results are displayed) Labs Reviewed  COMPREHENSIVE METABOLIC PANEL - Abnormal; Notable for the following components:      Result Value   Potassium 3.3 (*)    Total Protein 8.5 (*)    All other components within normal limits  SALICYLATE LEVEL - Abnormal; Notable for the following components:   Salicylate Lvl <7.0 (*)    All other components within normal limits  ACETAMINOPHEN LEVEL - Abnormal; Notable for the following components:   Acetaminophen (Tylenol), Serum <10 (*)    All other components  within normal limits  ETHANOL  CBC  PREGNANCY, URINE  RAPID URINE DRUG SCREEN, HOSP PERFORMED    EKG None  Radiology No results found.  Procedures Procedures    Medications Ordered in ED Medications  potassium chloride SA (KLOR-CON M) CR tablet 20 mEq (has no administration in time range)    ED Course/ Medical Decision Making/ A&P                                 Medical Decision Making Amount and/or Complexity of Data Reviewed Labs: ordered.  Patient is a 32 y.o. female  who presents to the emergency department for psychiatric complaint. Pt is here voluntarily.   Past Medical History: depression, bipolar 1 disorder, mental disability, diabetes, sleep apnea, migraines  Physical Exam: Hypertensive and mildly tachycardic, otherwise normal vital signs.  Resting comfortably in exam chair.  Endorsing SI and HI with plans for both, denies AVH.  Labs: Medical clearance labs ordered, with following pertinent results: CBC normal, potassium 3.3, otherwise CMP unremarkable. Negative acetaminophen, salicylate, and ethanol levels. Negative pregnancy. UDS pending.   Medications: I ordered medication including potassium replacement. I have reviewed the patients home medicines and have made adjustments as needed.  Disposition: Patient is otherwise medically cleared at this time pending medical clearance laboratory evaluation. Will consult TTS and appreciate their recommendations.  1900 -- Behavioral health attempted to see patient. Patient was refusing to answer any questions, reports she was "in a bad mood" and dismissed the provider. Day team will defer to night shift provider to attempt to reassess.   Final Clinical Impression(s) / ED Diagnoses Final diagnoses:  Suicidal ideation  Homicidal ideation  Anger    Rx / DC Orders ED Discharge Orders     None      Portions of this report may have been transcribed using voice recognition software. Every effort was made to  ensure accuracy; however, inadvertent computerized transcription errors may be present.    Jeanella Flattery 05/10/23 1911    Rolan Bucco, MD 05/10/23 1924

## 2023-05-10 NOTE — ED Notes (Signed)
Wanded by security

## 2023-05-10 NOTE — ED Notes (Signed)
TTS consult on hold due to pt being asleep

## 2023-05-11 DIAGNOSIS — F79 Unspecified intellectual disabilities: Secondary | ICD-10-CM

## 2023-05-11 DIAGNOSIS — R9431 Abnormal electrocardiogram [ECG] [EKG]: Secondary | ICD-10-CM | POA: Diagnosis not present

## 2023-05-11 MED ORDER — LINACLOTIDE 72 MCG PO CAPS
72.0000 ug | ORAL_CAPSULE | Freq: Every morning | ORAL | Status: DC
  Administered 2023-05-12: 72 ug via ORAL
  Filled 2023-05-11 (×2): qty 1

## 2023-05-11 MED ORDER — ACETAMINOPHEN 325 MG PO TABS
650.0000 mg | ORAL_TABLET | Freq: Four times a day (QID) | ORAL | Status: DC | PRN
  Administered 2023-05-11: 650 mg via ORAL
  Filled 2023-05-11: qty 2

## 2023-05-11 MED ORDER — HYDROXYZINE HCL 25 MG PO TABS: 50.0000 mg | ORAL_TABLET | Freq: Two times a day (BID) | ORAL | Status: DC | PRN

## 2023-05-11 MED ORDER — BACLOFEN 10 MG PO TABS
10.0000 mg | ORAL_TABLET | Freq: Every day | ORAL | Status: DC
  Administered 2023-05-11 – 2023-05-12 (×2): 10 mg via ORAL
  Filled 2023-05-11 (×2): qty 1

## 2023-05-11 MED ORDER — GABAPENTIN 300 MG PO CAPS
300.0000 mg | ORAL_CAPSULE | Freq: Every evening | ORAL | Status: DC
  Administered 2023-05-11: 300 mg via ORAL
  Filled 2023-05-11: qty 1

## 2023-05-11 MED ORDER — METFORMIN HCL 500 MG PO TABS
500.0000 mg | ORAL_TABLET | Freq: Every day | ORAL | Status: DC
  Administered 2023-05-11 – 2023-05-12 (×2): 500 mg via ORAL
  Filled 2023-05-11 (×2): qty 1

## 2023-05-11 MED ORDER — ATORVASTATIN CALCIUM 40 MG PO TABS
40.0000 mg | ORAL_TABLET | Freq: Every evening | ORAL | Status: DC
  Administered 2023-05-11: 40 mg via ORAL
  Filled 2023-05-11: qty 1

## 2023-05-11 MED ORDER — LAMOTRIGINE 100 MG PO TABS
100.0000 mg | ORAL_TABLET | Freq: Every day | ORAL | Status: DC
  Administered 2023-05-11 – 2023-05-12 (×2): 100 mg via ORAL
  Filled 2023-05-11 (×2): qty 1

## 2023-05-11 MED ORDER — OLANZAPINE 10 MG PO TBDP
10.0000 mg | ORAL_TABLET | Freq: Every day | ORAL | Status: DC
  Administered 2023-05-11: 10 mg via ORAL
  Filled 2023-05-11: qty 1

## 2023-05-11 NOTE — Progress Notes (Signed)
Pt was accepted to [facility] TODAY 05/11/2023; Bed Assignment Main [facility] Fax Number: [phone] (Adult)  Pt meets inpatient criteria per Alona Bene, PMHNP  Attending Physician will be Dr. Loni Beckwith   Report can be called to:[phone]-Pager number, please leave a returned phone number to receive a phone call back.     Pt can arrive after: BED IS READY  Care Team notified: Alona Bene, PMHNP, Latricia Heft, Paramedic  Maryjean Ka, MSW, [facility] 05/11/2023 4:25 PM
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
## 2023-05-11 NOTE — ED Provider Notes (Signed)
Emergency Medicine Observation Re-evaluation Note  Cindy Phillips is a 32 y.o. female, seen on rounds today.  Pt initially presented to the ED for complaints of Suicidal and Homicidal Currently, the patient is resting.  Physical Exam  BP (!) 153/111 (BP Location: Left Arm)   Pulse (!) 110   Temp 98.2 F (36.8 C) (Oral)   Resp 18   Ht 1.829 m (6')   Wt (!) 154.2 kg   SpO2 100%   BMI 46.11 kg/m  Physical Exam   ED Course / MDM  EKG:   I have reviewed the labs performed to date as well as medications administered while in observation.  Recent changes in the last 24 hours include improved mood.  Plan  Current plan is for assessment by psychiatry.    Lorre Nick, MD 05/11/23 [phone]

## 2023-05-11 NOTE — Progress Notes (Signed)
LCSW Progress Note  [id number]   Cindy Phillips  05/11/2023  3:33 PM    Inpatient Behavioral Health Placement  Pt meets inpatient criteria per Alona Bene, PMHNP. There are no available beds within [facility] per [facility] Antoinette Cillo, RN. Referral was sent to the following facilities;   Destination  Service Provider Address Phone [facility]  [address] [place], [place] [address] [phone]  [facility]  [address], [place] [address] [phone]  [facility] Patient Placement  [facility], [place] [phone]  [facility] [address]  [place] [address] [phone]  [facility]  [address] [place] [address] [phone]  [facility]  [address], [place] [address] [phone]  [facility]  [address], [place] [address] [phone]  [facility]  [address], [place] [address] [phone]  [facility]  [address] [place] [address] [phone]  [facility]  [address] [place] [address] [phone]  [facility]  [address], [place] [address] [phone]  [facility]  [address], [place] [address] [phone]  [facility]  [address], [place] [address] [phone]  [facility]  [facility] [address]egino Bellow [place] [address] [phone]  [facility] [address]  [address] [place] [address] [phone]  [phone]  [facility]  [address] [place], [place] [address] [phone]  [facility]  [address] [place] [address] [phone]  [facility]  [address] [place] [address] [phone]  [facility]  [address] [place] [address] [phone]  [facility]  [address] [place] [address] [phone]  [facility]  [address], [place] [address] [phone]  [facility]  [place] [phone]  [facility]  [address] [place] [address] [phone]  [facility]  [address] [place] [phone]  [facility]  [address], [place] [address] [phone]  [facility]  [address], [place] [address] [phone]  [facility]  [address], [place] [address] [phone]    Situation ongoing,  CSW will follow up.    Maryjean Ka, MSW, [facility] 05/11/2023 3:33 PM

## 2023-05-11 NOTE — ED Notes (Signed)
Pt given breakfast tray, said she does not want anything to eat. Pt resting quietly in room.

## 2023-05-12 DIAGNOSIS — F79 Unspecified intellectual disabilities: Secondary | ICD-10-CM | POA: Diagnosis not present

## 2023-05-12 NOTE — ED Notes (Addendum)
[facility] contacted [place] stating that the patient had a bed ready since 05/11/23. The sheriffs department is responsible for transporting the pt to the facility and currently is unable to do so.

## 2023-05-28 DIAGNOSIS — R600 Localized edema: Secondary | ICD-10-CM | POA: Diagnosis not present

## 2023-05-28 DIAGNOSIS — E1169 Type 2 diabetes mellitus with other specified complication: Secondary | ICD-10-CM | POA: Diagnosis not present

## 2023-05-28 DIAGNOSIS — R609 Edema, unspecified: Secondary | ICD-10-CM | POA: Diagnosis not present

## 2023-06-03 DIAGNOSIS — Z5181 Encounter for therapeutic drug level monitoring: Secondary | ICD-10-CM | POA: Diagnosis not present

## 2023-06-11 DIAGNOSIS — R600 Localized edema: Secondary | ICD-10-CM | POA: Diagnosis not present

## 2023-06-13 ENCOUNTER — Emergency Department (HOSPITAL_COMMUNITY)
Admission: EM | Admit: 2023-06-13 | Discharge: 2023-06-17 | Disposition: A | Discharge status: Home / Self Care | Payer: 59 | Attending: Emergency Medicine | Admitting: Emergency Medicine

## 2023-06-13 ENCOUNTER — Ambulatory Visit (HOSPITAL_COMMUNITY)
Admission: EM | Admit: 2023-06-13 | Discharge: 2023-06-13 | Disposition: A | Discharge status: Other Acute Inpatient Hospital | Payer: 59

## 2023-06-13 DIAGNOSIS — E119 Type 2 diabetes mellitus without complications: Secondary | ICD-10-CM | POA: Insufficient documentation

## 2023-06-13 DIAGNOSIS — F250 Schizoaffective disorder, bipolar type: Secondary | ICD-10-CM | POA: Diagnosis not present

## 2023-06-13 DIAGNOSIS — R000 Tachycardia, unspecified: Secondary | ICD-10-CM | POA: Insufficient documentation

## 2023-06-13 DIAGNOSIS — Z79899 Other long term (current) drug therapy: Secondary | ICD-10-CM | POA: Insufficient documentation

## 2023-06-13 DIAGNOSIS — F29 Unspecified psychosis not due to a substance or known physiological condition: Secondary | ICD-10-CM | POA: Insufficient documentation

## 2023-06-13 DIAGNOSIS — R4689 Other symptoms and signs involving appearance and behavior: Secondary | ICD-10-CM

## 2023-06-13 DIAGNOSIS — Z7984 Long term (current) use of oral hypoglycemic drugs: Secondary | ICD-10-CM | POA: Diagnosis not present

## 2023-06-13 DIAGNOSIS — F23 Brief psychotic disorder: Secondary | ICD-10-CM

## 2023-06-13 DIAGNOSIS — R456 Violent behavior: Secondary | ICD-10-CM | POA: Diagnosis present

## 2023-06-13 LAB — CBC WITH DIFFERENTIAL/PLATELET
Abs Immature Granulocytes: 0.04 10*3/uL (ref 0.00–0.07)
Basophils Absolute: 0.1 10*3/uL (ref 0.0–0.1)
Basophils Relative: 0 %
Eosinophils Absolute: 0 10*3/uL (ref 0.0–0.5)
Eosinophils Relative: 0 %
HCT: 40.3 % (ref 36.0–46.0)
Hemoglobin: 12.7 g/dL (ref 12.0–15.0)
Immature Granulocytes: 0 %
Lymphocytes Relative: 17 %
Lymphs Abs: 2.6 10*3/uL (ref 0.7–4.0)
MCH: 27.7 pg (ref 26.0–34.0)
MCHC: 31.5 g/dL (ref 30.0–36.0)
MCV: 87.8 fL (ref 80.0–100.0)
Monocytes Absolute: 1 10*3/uL (ref 0.1–1.0)
Monocytes Relative: 6 %
Neutro Abs: 12 10*3/uL — ABNORMAL HIGH (ref 1.7–7.7)
Neutrophils Relative %: 77 %
Platelets: 295 10*3/uL (ref 150–400)
RBC: 4.59 MIL/uL (ref 3.87–5.11)
RDW: 13.6 % (ref 11.5–15.5)
WBC: 15.8 10*3/uL — ABNORMAL HIGH (ref 4.0–10.5)
nRBC: 0 % (ref 0.0–0.2)

## 2023-06-13 LAB — COMPREHENSIVE METABOLIC PANEL
ALT: 18 U/L (ref 0–44)
AST: 23 U/L (ref 15–41)
Albumin: 4.5 g/dL (ref 3.5–5.0)
Alkaline Phosphatase: 107 U/L (ref 38–126)
Anion gap: 16 — ABNORMAL HIGH (ref 5–15)
BUN: 7 mg/dL (ref 6–20)
CO2: 17 mmol/L — ABNORMAL LOW (ref 22–32)
Calcium: 9.7 mg/dL (ref 8.9–10.3)
Chloride: 103 mmol/L (ref 98–111)
Creatinine, Ser: 1.09 mg/dL — ABNORMAL HIGH (ref 0.44–1.00)
GFR, Estimated: >60 mL/min (ref >60)
Glucose, Bld: 107 mg/dL — ABNORMAL HIGH (ref 70–99)
Potassium: 4 mmol/L (ref 3.5–5.1)
Sodium: 136 mmol/L (ref 135–145)
Total Bilirubin: 1.1 mg/dL (ref 0.3–1.2)
Total Protein: 8.6 g/dL — ABNORMAL HIGH (ref 6.5–8.1)

## 2023-06-13 LAB — ETHANOL: Alcohol, Ethyl (B): <10 mg/dL (ref <10)

## 2023-06-13 LAB — HCG, SERUM, QUALITATIVE: Preg, Serum: NEGATIVE

## 2023-06-13 MED ORDER — MIDAZOLAM HCL (PF) 10 MG/2ML IJ SOLN
10.0000 mg | Freq: Once | INTRAMUSCULAR | Status: AC
  Administered 2023-06-13: 10 mg via INTRAVENOUS
  Filled 2023-06-13: qty 2

## 2023-06-13 MED ORDER — DIPHENHYDRAMINE HCL 50 MG/ML IJ SOLN
25.0000 mg | Freq: Once | INTRAMUSCULAR | Status: AC
  Administered 2023-06-13: 25 mg via INTRAMUSCULAR
  Filled 2023-06-13: qty 1

## 2023-06-13 MED ORDER — DROPERIDOL 2.5 MG/ML IJ SOLN
2.5000 mg | Freq: Once | INTRAMUSCULAR | Status: AC
  Administered 2023-06-13: 2.5 mg via INTRAMUSCULAR
  Filled 2023-06-13: qty 2

## 2023-06-13 MED ORDER — MIDAZOLAM HCL (PF) 10 MG/2ML IJ SOLN
10.0000 mg | Freq: Once | INTRAMUSCULAR | Status: AC
  Administered 2023-06-13: 10 mg via INTRAMUSCULAR
  Filled 2023-06-13: qty 2
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
## 2023-06-13 NOTE — ED Notes (Addendum)
Rn tired to call [place] ed charge to inform her that the patient was coming over . Secretary states that charge is on the phone an to call back. Provider also states that she is calling the ed. Provider sent the patient to the ed due to being aggessive. Patient did not enter the unit. [employer] left before emtala could be given to them. Patient refused vitals and would note talk to staff.

## 2023-06-13 NOTE — ED Provider Notes (Signed)
[place] EMERGENCY DEPARTMENT AT [facility] Provider Note  CSN: [id number] Arrival date & time: 06/13/23 1907  Chief Complaint(s) No chief complaint on file.  HPI Cindy Phillips is a 32 y.o. female with past medical history as below, significant for bipolar 1 disorder, mental disability, OSA, type II DM arthritis who presents to the ED with complaint of agitation, aggressive behavior  Patient is a difficult historian, poor compliance with history.  Reports she is upset her family.  [employer] at bedside and behavioral health response team.  When I ask patient questions- she screams nonsensical words and began kicking objects.  At one point she attempted to elope after being placed under IVC and was brought back to the treatment area by [employer] in handcuffs.   Past Medical History Past Medical History:  Diagnosis Date   Arthritis    Bipolar 1 disorder (HCC)    Bowel incontinence    Depression    Diabetes mellitus without complication (HCC)    Headache    migraines   Mental disability    Sleep apnea    Patient Active Problem List   Diagnosis Date Noted   Pain in left elbow 07/27/2020   Prediabetes 02/03/2019   Obstructive sleep apnea 04/23/2018   Other fatigue 11/26/2017   Shortness of breath on exertion 11/26/2017   Type 2 diabetes mellitus without complication, without long-term current use of insulin (HCC) 11/26/2017   Other hyperlipidemia 11/26/2017   Vitamin D deficiency 11/26/2017   Schizoaffective disorder, bipolar type (HCC) 09/14/2014   Intellectual disability 09/10/2014   Home Medication(s) Prior to Admission medications   Medication Sig Start Date End Date Taking? Authorizing Provider  amoxicillin (AMOXIL) 875 MG tablet Take 875 mg by mouth 2 (two) times daily. Patient not taking: Reported on 05/10/2023 04/17/23   [provider]  atorvastatin (LIPITOR) 40 MG tablet Take 40 mg by mouth every evening.    [provider]  baclofen (LIORESAL)  10 MG tablet Take 10 mg by mouth. 05/06/23   [provider]  doxycycline (VIBRAMYCIN) 100 MG capsule Take 1 capsule (100 mg total) by mouth 2 (two) times daily. Patient not taking: Reported on 05/10/2023 03/31/23   Small, Brooke L, PA  gabapentin (NEURONTIN) 300 MG capsule Take 300 mg by mouth every evening. 04/17/22   [provider]  hydrOXYzine (ATARAX) 50 MG tablet Take 1 tablet (50 mg total) by mouth 2 (two) times daily as needed for anxiety. 04/09/23   Lauro Franklin, MD  ibuprofen (ADVIL) 600 MG tablet Take 600 mg by mouth every 6 (six) hours as needed. Patient not taking: Reported on 05/10/2023 04/17/23   [provider]  lamoTRIgine (LAMICTAL) 100 MG tablet Take 100 mg by mouth daily.    [provider]  LINZESS 72 MCG capsule Take 72 mcg by mouth every morning. 04/02/23   [provider]  medroxyPROGESTERone (DEPO-PROVERA) 150 MG/ML injection Inject 1 mL (150 mg total) into the muscle every 3 (three) months. For prevention of pregnancy 09/14/14   Armandina Stammer I, NP  metFORMIN (GLUCOPHAGE) 500 MG tablet Take one tablet by mouth in the morning and one tablet by mouth in the evening Patient taking differently: Take 500 mg by mouth daily with breakfast. 03/21/20   Quillian Quince D, MD  OLANZapine zydis (ZYPREXA) 10 MG disintegrating tablet Take 1 tablet (10 mg total) by mouth at bedtime. 04/09/23   Lauro Franklin, MD  Past Surgical History Past Surgical History:  Procedure Laterality Date   ANTERIOR CRUCIATE LIGAMENT REPAIR Right 05/11/2022   Procedure: RIGHT KNEE ARTHROSCOPY; LATERAL MENISECTOMY; ANTERIOR CRUCIATE LIGAMENT (ACL) REPAIR; CHONDROPLASTY;  Surgeon: Frederico Hamman, MD;  Location: [facility];  Service: Orthopedics;  Laterality: Right;   FOOT SURGERY     KNEE ARTHROSCOPY W/ ACL RECONSTRUCTION Left 10/12/2020    TOOTH EXTRACTION     Family History Family History  Problem Relation Age of Onset   High blood pressure Mother    Anxiety disorder Mother    Diabetes Maternal Grandmother     Social History Social History   Tobacco Use   Smoking status: Never   Smokeless tobacco: Never  Vaping Use   Vaping status: Never Used  Substance Use Topics   Alcohol use: Not Currently   Drug use: No   Allergies Patient has no known allergies.  Review of Systems Review of Systems  All other systems reviewed and are negative.   Physical Exam Vital Signs  I have reviewed the triage vital signs BP (!) 167/73 (BP Location: Right Leg)   Pulse (!) 128   Temp (!) 97.1 F (36.2 C) (Temporal)   Resp 18   Ht 6' (1.829 m)   Wt (!) 152.9 kg   SpO2 100%   BMI 45.71 kg/m  Physical Exam Vitals and nursing note reviewed.  Constitutional:      General: She is not in acute distress.    Appearance: Normal appearance. She is well-developed. She is obese. She is not ill-appearing.  HENT:     Head: Normocephalic and atraumatic.     Right Ear: External ear normal.     Left Ear: External ear normal.     Nose: Nose normal.     Mouth/Throat:     Mouth: Mucous membranes are moist.  Eyes:     General: No scleral icterus.       Right eye: No discharge.        Left eye: No discharge.  Cardiovascular:     Rate and Rhythm: Tachycardia present.  Pulmonary:     Effort: Pulmonary effort is normal. No respiratory distress.     Breath sounds: No stridor.  Abdominal:     General: Abdomen is flat. There is no distension.     Tenderness: There is no guarding.  Musculoskeletal:        General: No deformity.     Cervical back: No rigidity.  Skin:    General: Skin is warm and dry.     Coloration: Skin is not cyanotic, jaundiced or pale.  Neurological:     Mental Status: She is alert and oriented to person, place, and time.     GCS: GCS eye subscore is 4. GCS verbal subscore is 5. GCS motor subscore is 6.   Psychiatric:        Attention and Perception: She is inattentive.        Mood and Affect: Affect is labile and angry.        Speech: Speech normal.        Behavior: Behavior is uncooperative, agitated, aggressive and hyperactive.     ED Results and Treatments Labs (all labs ordered are listed, but only abnormal results are displayed) Labs Reviewed  COMPREHENSIVE METABOLIC PANEL - Abnormal; Notable for the following components:      Result Value   CO2 17 (*)    Glucose, Bld 107 (*)    Creatinine, Ser 1.09 (*)  Total Protein 8.6 (*)    Anion gap 16 (*)    All other components within normal limits  CBC WITH DIFFERENTIAL/PLATELET - Abnormal; Notable for the following components:   WBC 15.8 (*)    Neutro Abs 12.0 (*)    All other components within normal limits  ETHANOL  HCG, SERUM, QUALITATIVE  RAPID URINE DRUG SCREEN, HOSP PERFORMED                                                                                                                          Radiology No results found.  Pertinent labs & imaging results that were available during my care of the patient were reviewed by me and considered in my medical decision making (see MDM for details).  Medications Ordered in ED Medications  midazolam PF (VERSED) injection 10 mg (10 mg Intravenous Given 06/13/23 1925)  droperidol (INAPSINE) 2.5 MG/ML injection 2.5 mg (2.5 mg Intramuscular Given 06/13/23 1954)  diphenhydrAMINE (BENADRYL) injection 25 mg (25 mg Intramuscular Given 06/13/23 1953)  midazolam PF (VERSED) injection 10 mg (10 mg Intramuscular Given 06/13/23 2047)  diphenhydrAMINE (BENADRYL) injection 25 mg (25 mg Intramuscular Given 06/13/23 2046)                                                                                                                                     Procedures .Critical Care  Performed by: Sloan Leiter, DO Authorized by: Sloan Leiter, DO   Critical care provider statement:     Critical care time (minutes):  30   Critical care time was exclusive of:  Separately billable procedures and treating other patients   Critical care was necessary to treat or prevent imminent or life-threatening deterioration of the following conditions: psychiatric crisis.   Critical care was time spent personally by me on the following activities:  Development of treatment plan with patient or surrogate, discussions with consultants, evaluation of patient's response to treatment, examination of patient, ordering and review of laboratory studies, ordering and performing treatments and interventions, pulse oximetry, re-evaluation of patient's condition, review of old charts and obtaining history from patient or surrogate   (including critical care time)  Medical Decision Making / ED Course    Medical Decision Making:    DEBBRAH GRADILLAS is a 32 y.o. female with past medical history as below, significant for bipolar 1 disorder, mental disability, OSA, type II  DM arthritis who presents to the ED with complaint of agitation, aggressive behavior. The complaint involves an extensive differential diagnosis and also carries with it a high risk of complications and morbidity.  Serious etiology was considered. Ddx includes but is not limited to: Substance-induced, psychogenic source, medication effect, psychosis, etc.  Complete initial physical exam performed, notably the patient  was agitated delirium noted, difficult to redirect patient, she is not compliant with history or care..    Reviewed and confirmed nursing documentation for past medical history, family history, social history.  Vital signs reviewed.    Clinical Course as of 06/13/23 2230  Thu Jun 13, 2023  1950 Pt acutely agitated, attempting to fight staff and [employer], she was placed in handcuffs by [employer], pt given versed/droperidol/benadryl [SG]  2125 WBC(!): 15.8 Favor acute stress response in setting of agitation  [SG]    Clinical Course  User Index [SG] Sloan Leiter, DO     Patient meets criteria for IVC, concern for acute psychosis.  Patient physically aggressive towards staff members, attempting to attack staff members and police officers.  Labs reviewed  She has tachycardia, is improving along with improvement to her agitation after multiple doses of sedating medications.  She is medically cleared at this time, more compliant with care after medications, pending TTS eval               Additional history obtained: -Additional history obtained from [employer], [facility]  -External records from outside source obtained and reviewed including: Chart review including previous notes, labs, imaging, consultation notes including  Home medications Prior ED evaluation [facility] care documentation   Lab Tests: -I ordered, reviewed, and interpreted labs.   The pertinent results include:   Labs Reviewed  COMPREHENSIVE METABOLIC PANEL - Abnormal; Notable for the following components:      Result Value   CO2 17 (*)    Glucose, Bld 107 (*)    Creatinine, Ser 1.09 (*)    Total Protein 8.6 (*)    Anion gap 16 (*)    All other components within normal limits  CBC WITH DIFFERENTIAL/PLATELET - Abnormal; Notable for the following components:   WBC 15.8 (*)    Neutro Abs 12.0 (*)    All other components within normal limits  ETHANOL  HCG, SERUM, QUALITATIVE  RAPID URINE DRUG SCREEN, HOSP PERFORMED    Notable for leukocytosis noted, favor stress response  EKG   EKG Interpretation Date/Time:    Ventricular Rate:    PR Interval:    QRS Duration:    QT Interval:    QTC Calculation:   R Axis:      Text Interpretation:           Imaging Studies ordered: na   Medicines ordered and prescription drug management: Meds ordered this encounter  Medications   midazolam PF (VERSED) injection 10 mg   droperidol (INAPSINE) 2.5 MG/ML injection 2.5 mg   diphenhydrAMINE (BENADRYL) injection 25 mg   midazolam PF (VERSED)  injection 10 mg   diphenhydrAMINE (BENADRYL) injection 25 mg    -I have reviewed the patients home medicines and have made adjustments as needed   Consultations Obtained: na   Cardiac Monitoring: Continuous pulse oximetry interpreted by myself, 99% on RA.    Social Determinants of Health:  Diagnosis or treatment significantly limited by social determinants of health: obesity   Reevaluation: After the interventions noted above, I reevaluated the patient and found that they have improved  Co morbidities that complicate  the patient evaluation  Past Medical History:  Diagnosis Date   Arthritis    Bipolar 1 disorder (HCC)    Bowel incontinence    Depression    Diabetes mellitus without complication (HCC)    Headache    migraines   Mental disability    Sleep apnea       Dispostion: Disposition decision including need for hospitalization was considered, and patient disposition pending at time of sign out.    Final Clinical Impression(s) / ED Diagnoses Final diagnoses:  Acute psychosis (HCC)  Aggressive behavior        Sloan Leiter, DO 06/13/23 2231

## 2023-06-13 NOTE — ED Notes (Signed)
PT still aggressive and agitated, PT refusing to allow for vitals at this time. PT given IM medications for agitation, will continue to attempt vitals as patient calms down.

## 2023-06-13 NOTE — ED Notes (Signed)
Rn called back and gave report to mike rn ed charge [facility] . Notified mike that [employer] left before we printed the emtala but that it was in the chart. Kathlene November states that it was ok.

## 2023-06-13 NOTE — ED Notes (Signed)
PT Changed into purple scrubs, PT's belongings bagged and tagged with PT sticker.

## 2023-06-13 NOTE — ED Notes (Signed)
Pt wheeled to unit a&ox 4; pt pleasant to this RN on arrival; Pt denies SI/HI and Hallucinations; Pt states she will try to sleep til 9am; Pt given warm blankets-Monique,RN

## 2023-06-13 NOTE — ED Notes (Signed)
PT wanded by security and moved to purple zone.

## 2023-06-14 ENCOUNTER — Encounter (HOSPITAL_COMMUNITY): Payer: Self-pay

## 2023-06-14 ENCOUNTER — Other Ambulatory Visit: Payer: Self-pay

## 2023-06-14 DIAGNOSIS — F250 Schizoaffective disorder, bipolar type: Secondary | ICD-10-CM | POA: Diagnosis not present

## 2023-06-14 LAB — RAPID URINE DRUG SCREEN, HOSP PERFORMED
Amphetamines: NOT DETECTED
Barbiturates: NOT DETECTED
Benzodiazepines: POSITIVE — AB
Cocaine: NOT DETECTED
Opiates: NOT DETECTED
Tetrahydrocannabinol: NOT DETECTED

## 2023-06-14 MED ORDER — LORAZEPAM 1 MG PO TABS
2.0000 mg | ORAL_TABLET | Freq: Four times a day (QID) | ORAL | Status: DC | PRN
  Administered 2023-06-14 – 2023-06-15 (×2): 2 mg via ORAL
  Filled 2023-06-14 (×2): qty 2

## 2023-06-14 MED ORDER — HALOPERIDOL 5 MG PO TABS: 5.0000 mg | ORAL_TABLET | Freq: Four times a day (QID) | ORAL | Status: DC | PRN

## 2023-06-14 MED ORDER — DIPHENHYDRAMINE HCL 25 MG PO CAPS: 50.0000 mg | ORAL_CAPSULE | Freq: Four times a day (QID) | ORAL | Status: DC | PRN

## 2023-06-14 MED ORDER — DIPHENHYDRAMINE HCL 50 MG/ML IJ SOLN
50.0000 mg | Freq: Four times a day (QID) | INTRAMUSCULAR | Status: DC | PRN
  Administered 2023-06-15: 50 mg via INTRAMUSCULAR
  Filled 2023-06-14: qty 1

## 2023-06-14 MED ORDER — HALOPERIDOL LACTATE 5 MG/ML IJ SOLN
5.0000 mg | Freq: Four times a day (QID) | INTRAMUSCULAR | Status: DC | PRN
  Administered 2023-06-15: 5 mg via INTRAMUSCULAR
  Filled 2023-06-14: qty 1

## 2023-06-14 MED ORDER — OLANZAPINE 5 MG PO TBDP
20.0000 mg | ORAL_TABLET | Freq: Every day | ORAL | Status: DC
  Administered 2023-06-14 – 2023-06-16 (×3): 20 mg via ORAL
  Filled 2023-06-14 (×3): qty 4

## 2023-06-14 MED ORDER — LORAZEPAM 2 MG/ML IJ SOLN
2.0000 mg | Freq: Four times a day (QID) | INTRAMUSCULAR | Status: DC | PRN
  Administered 2023-06-15: 2 mg via INTRAMUSCULAR
  Filled 2023-06-14: qty 1

## 2023-06-14 MED ORDER — LAMOTRIGINE 150 MG PO TABS
150.0000 mg | ORAL_TABLET | Freq: Every day | ORAL | Status: DC
  Administered 2023-06-14 – 2023-06-17 (×3): 150 mg via ORAL
  Filled 2023-06-14 (×5): qty 1

## 2023-06-14 NOTE — Progress Notes (Signed)
CSW received a phone call from Mannie Stabile advising that pt has been denied due to not appropriate for current milieu.   Maryjean Ka, MSW, [facility] 06/14/2023 8:33 PM

## 2023-06-14 NOTE — ED Notes (Signed)
PRN ativan given to help pt w/ agitation triggered by other pt in the zone.

## 2023-06-14 NOTE — ED Notes (Signed)
Pt door closed to prevent further agitation from another pt in the zone

## 2023-06-14 NOTE — Consult Note (Signed)
BH ED ASSESSMENT   Reason for Consult:  Psychosis, aggressive behavior, ivc Referring Physician:  Tanda Rockers Patient Identification: Cindy Phillips MRN:  [id number] ED Chief Complaint: Schizoaffective disorder, bipolar type (HCC)  Diagnosis:  Principal Problem:   Schizoaffective disorder, bipolar type [facility])   ED Assessment Time Calculation: Start Time: 1000 Stop Time: 1100 Total Time in Minutes (Assessment Completion): 60  HPI:  Cindy Phillips is a 32 y.o. female patient brought in under IVC by [employer] from [facility] after being unable to admit patient due to behaviors.  Patient history includes DM2, OSA, arthritis, Schizoaffective disorder, bipolar type and intellectual disorder.   Subjective:   Cindy Phillips is a 32 y.o. female patient brought in under IVC by [employer] from [facility] after being unable to admit patient due to behaviors.  Patient history includes DM2, OSA, arthritis, Schizoaffective disorder, bipolar type and intellectual disorder.   Cindy Phillips, 32 y.o., female patient seen face to face by this provider, consulted with Dr. Lucianne Muss; and chart reviewed on 06/14/23.  On evaluation Cindy Phillips reports that she is having command hallucinations telling her to jump off a bridge. She says she doesn't think her medication works very well.  She hears the voices all the time and it is very difficult to live with.  Patient says her uncle also heard voices and he burned himself because the voices kept telling him to do it.  She is afraid she won't be able to resist the voices and she is going to herself and others because of it.  The patient said she doesn't have a good relationship with her Mother and when asked where she lives or who she lives with, patient stated "I don't know where I stay at".    During evaluation Cindy Phillips is resting in bed in no acute distress.  She is alert, oriented x 4, calm, cooperative and attentive. Her mood is depressed and irritable with congruent  affect.  She has normal speech, and behavior.  Objectively there is evidence of psychosis/mania and delusional thinking.  Patient is able to converse coherently with goal directed thoughts.  She endorses suicidal/self-harm/homicidal ideation, psychosis, and paranoia.  Patient answered questions appropriately.    Patient is a danger to herself and she is acutely psychotic.  She requires inpatient psychiatric hospitalization for stabilization and treatment.   Past Psychiatric History: Schizoaffective disorder, bipolar type and intellectual disorder.  Risk to Self or Others: Is the patient at risk to self? Yes Has the patient been a risk to self in the past 6 months? Yes Has the patient been a risk to self within the distant past? Yes Is the patient a risk to others? Yes Has the patient been a risk to others in the past 6 months? Yes Has the patient been a risk to others within the distant past? No  [place] Scale:  Flowsheet Row ED from 05/10/2023 in [facility] Emergency Department at [facility] Admission (Discharged) [facility] from 04/04/2023 in [facility] Emergency Department at [facility]  C-SSRS RISK CATEGORY High Risk High Risk High Risk      Substance Abuse:   None noted  Past Medical History:  Past Medical History:  Diagnosis Date   Arthritis    Bipolar 1 disorder (HCC)    Bowel incontinence    Depression    Diabetes mellitus without complication (HCC)    Headache    migraines  Mental disability    Sleep apnea     Past Surgical History:  Procedure Laterality Date   ANTERIOR CRUCIATE LIGAMENT REPAIR Right 05/11/2022   Procedure: RIGHT KNEE ARTHROSCOPY; LATERAL MENISECTOMY; ANTERIOR CRUCIATE LIGAMENT (ACL) REPAIR; CHONDROPLASTY;  Surgeon: Frederico Hamman, MD;  Location: [facility];  Service: Orthopedics;  Laterality: Right;   FOOT SURGERY     KNEE ARTHROSCOPY W/ ACL RECONSTRUCTION Left 10/12/2020    TOOTH EXTRACTION     Family History:  Family History  Problem Relation Age of Onset   High blood pressure Mother    Anxiety disorder Mother    Diabetes Maternal Grandmother    Family Psychiatric  History: Uncle with schizophrenia Social History:  Social History   Substance and Sexual Activity  Alcohol Use Not Currently     Social History   Substance and Sexual Activity  Drug Use No    Social History   Socioeconomic History   Marital status: Single    Spouse name: Not on file   Number of children: 0   Years of education: Not on file   Highest education level: Not on file  Occupational History   Occupation: Nature conservation officer     Comment: [employer]   Tobacco Use   Smoking status: Never   Smokeless tobacco: Never  Vaping Use   Vaping status: Never Used  Substance and Sexual Activity   Alcohol use: Not Currently   Drug use: No   Sexual activity: Not Currently  Other Topics Concern   Not on file  Social History Narrative   Not on file   Social Determinants of Health   Financial Resource Strain: Not on file  Food Insecurity: No Food Insecurity (04/06/2023)   Hunger Vital Sign    Worried About Running Out of Food in the Last Year: Never true    Ran Out of Food in the Last Year: Never true  Transportation Needs: No Transportation Needs (04/06/2023)   PRAPARE - Administrator, Civil Service (Medical): No    Lack of Transportation (Non-Medical): No  Physical Activity: Not on file  Stress: Not on file  Social Connections: Not on file   Additional Social History: Unknown    Allergies:  No Known Allergies  Labs:  Results for orders placed or performed during the hospital encounter of 06/13/23 (from the past 48 hour(s))  Comprehensive metabolic panel     Status: Abnormal   Collection Time: 06/13/23  8:24 PM  Result Value Ref Range   Sodium 136 135 - 145 mmol/L   Potassium 4.0 3.5 - 5.1 mmol/L   Chloride 103 98 - 111 mmol/L   CO2 17 (L) 22 - 32 mmol/L    Glucose, Bld 107 (H) 70 - 99 mg/dL    Comment: Glucose reference range applies only to samples taken after fasting for at least 8 hours.   BUN 7 6 - 20 mg/dL   Creatinine, Ser 2.13 (H) 0.44 - 1.00 mg/dL   Calcium 9.7 8.9 - 08.6 mg/dL   Total Protein 8.6 (H) 6.5 - 8.1 g/dL   Albumin 4.5 3.5 - 5.0 g/dL   AST 23 15 - 41 U/L   ALT 18 0 - 44 U/L   Alkaline Phosphatase 107 38 - 126 U/L   Total Bilirubin 1.1 0.3 - 1.2 mg/dL   GFR, Estimated >57 >84 mL/min    Comment: (NOTE) Calculated using the CKD-EPI Creatinine Equation (2021)    Anion gap 16 (H) 5 - 15  Comment: Performed at [facility], [address] [place], [place] [address]  Ethanol     Status: None   Collection Time: 06/13/23  8:24 PM  Result Value Ref Range   Alcohol, Ethyl (B) <10 <10 mg/dL    Comment: (NOTE) Lowest detectable limit for serum alcohol is 10 mg/dL.  For medical purposes only. Performed at [facility], [address], [place], [place] [address]   CBC with Diff     Status: Abnormal   Collection Time: 06/13/23  8:24 PM  Result Value Ref Range   WBC 15.8 (H) 4.0 - 10.5 K/uL   RBC 4.59 3.87 - 5.11 MIL/uL   Hemoglobin 12.7 12.0 - 15.0 g/dL   HCT 46.2 70.3 - 50.0 %   MCV 87.8 80.0 - 100.0 fL   MCH 27.7 26.0 - 34.0 pg   MCHC 31.5 30.0 - 36.0 g/dL   RDW 93.8 18.2 - 99.3 %   Platelets 295 150 - 400 K/uL   nRBC 0.0 0.0 - 0.2 %   Neutrophils Relative % 77 %   Neutro Abs 12.0 (H) 1.7 - 7.7 K/uL   Lymphocytes Relative 17 %   Lymphs Abs 2.6 0.7 - 4.0 K/uL   Monocytes Relative 6 %   Monocytes Absolute 1.0 0.1 - 1.0 K/uL   Eosinophils Relative 0 %   Eosinophils Absolute 0.0 0.0 - 0.5 K/uL   Basophils Relative 0 %   Basophils Absolute 0.1 0.0 - 0.1 K/uL   Immature Granulocytes 0 %   Abs Immature Granulocytes 0.04 0.00 - 0.07 K/uL    Comment: Performed at [facility], [address] [place], [place] [address]  hCG, serum, qualitative     Status: None   Collection Time: 06/13/23  8:24 PM   Result Value Ref Range   Preg, Serum NEGATIVE NEGATIVE    Comment:        THE SENSITIVITY OF THIS METHODOLOGY IS >10 mIU/mL. Performed at [facility], [address], [place], [place] [address]   Urine rapid drug screen (hosp performed)     Status: Abnormal   Collection Time: 06/14/23  8:48 AM  Result Value Ref Range   Opiates NONE DETECTED NONE DETECTED   Cocaine NONE DETECTED NONE DETECTED   Benzodiazepines POSITIVE (A) NONE DETECTED   Amphetamines NONE DETECTED NONE DETECTED   Tetrahydrocannabinol NONE DETECTED NONE DETECTED   Barbiturates NONE DETECTED NONE DETECTED    Comment: (NOTE) DRUG SCREEN FOR MEDICAL PURPOSES ONLY.  IF CONFIRMATION IS NEEDED FOR ANY PURPOSE, NOTIFY LAB WITHIN 5 DAYS.  LOWEST DETECTABLE LIMITS FOR URINE DRUG SCREEN Drug Class                     Cutoff (ng/mL) Amphetamine and metabolites    1000 Barbiturate and metabolites    200 Benzodiazepine                 200 Opiates and metabolites        300 Cocaine and metabolites        300 THC                            50 Performed at [facility], [address], [place], [place] [address]     Current Facility-Administered Medications  Medication Dose Route Frequency Provider Last Rate Last Admin   diphenhydrAMINE (BENADRYL) capsule 50 mg  50 mg  Oral Q6H PRN Moua Rasmusson A, NP       Or   diphenhydrAMINE (BENADRYL) injection 50 mg  50 mg Intramuscular Q6H PRN Dajah Fischman A, NP       haloperidol (HALDOL) tablet 5 mg  5 mg Oral Q6H PRN Wilburt Messina A, NP       Or   haloperidol lactate (HALDOL) injection 5 mg  5 mg Intramuscular Q6H PRN Veronika Heard A, NP       lamoTRIgine (LAMICTAL) tablet 150 mg  150 mg Oral Daily Jillianne Gamino A, NP   150 mg at 06/14/23 0932   LORazepam (ATIVAN) tablet 2 mg  2 mg Oral Q6H PRN Phebe Colla A, NP   2 mg at 06/14/23 4098   Or   LORazepam (ATIVAN) injection 2 mg  2 mg Intramuscular Q6H PRN Mostyn Varnell A, NP       OLANZapine zydis (ZYPREXA) disintegrating  tablet 20 mg  20 mg Oral QHS Leslieann Whisman A, NP       Current Outpatient Medications  Medication Sig Dispense Refill   amoxicillin (AMOXIL) 875 MG tablet Take 875 mg by mouth 2 (two) times daily. (Patient not taking: Reported on 05/10/2023)     atorvastatin (LIPITOR) 40 MG tablet Take 40 mg by mouth every evening.     baclofen (LIORESAL) 10 MG tablet Take 10 mg by mouth.     celecoxib (CELEBREX) 200 MG capsule Take 200 mg by mouth 2 (two) times daily.     diclofenac Sodium (VOLTAREN) 1 % GEL      doxycycline (VIBRAMYCIN) 100 MG capsule Take 1 capsule (100 mg total) by mouth 2 (two) times daily. (Patient not taking: Reported on 05/10/2023) 20 capsule 0   gabapentin (NEURONTIN) 300 MG capsule Take 300 mg by mouth every evening.     hydrOXYzine (ATARAX) 50 MG tablet Take 1 tablet (50 mg total) by mouth 2 (two) times daily as needed for anxiety.     ibuprofen (ADVIL) 600 MG tablet Take 600 mg by mouth every 6 (six) hours as needed. (Patient not taking: Reported on 05/10/2023)     lamoTRIgine (LAMICTAL) 100 MG tablet Take 100 mg by mouth daily.     lamoTRIgine (LAMICTAL) 150 MG tablet Take 150 mg by mouth daily.     LINZESS 72 MCG capsule Take 72 mcg by mouth every morning.     medroxyPROGESTERone (DEPO-PROVERA) 150 MG/ML injection Inject 1 mL (150 mg total) into the muscle every 3 (three) months. For prevention of pregnancy 1 mL    metFORMIN (GLUCOPHAGE) 500 MG tablet Take one tablet by mouth in the morning and one tablet by mouth in the evening (Patient taking differently: Take 500 mg by mouth daily with breakfast.) 60 tablet 0   naproxen (NAPROSYN) 250 MG tablet Take by mouth.     OLANZapine (ZYPREXA) 20 MG tablet Take 20 mg by mouth at bedtime.     OLANZapine zydis (ZYPREXA) 10 MG disintegrating tablet Take 1 tablet (10 mg total) by mouth at bedtime. 30 tablet 0    Musculoskeletal: Strength & Muscle Tone: within normal limits Gait & Station: normal Patient leans: N/A   Psychiatric Specialty  Exam: Presentation  General Appearance:  Disheveled  Eye Contact: Fair  Speech: Clear and Coherent  Speech Volume: Normal  Handedness: Right   Mood and Affect  Mood: Depressed; Irritable  Affect: Congruent   Thought Process  Thought Processes: Coherent  Descriptions of Associations:Intact  Orientation:Full (Time, Place and Person)  Thought  Content:WDL  History of Schizophrenia/Schizoaffective disorder:No data recorded Duration of Psychotic Symptoms:No data recorded Hallucinations:Hallucinations: Auditory; Visual Description of Auditory Hallucinations: Command hallucinations telling her to jump off a bridge Description of Visual Hallucinations: Patient did not elaborate  Ideas of Reference:None  Suicidal Thoughts:Suicidal Thoughts: Yes, Passive  Homicidal Thoughts:Homicidal Thoughts: Yes, Passive   Sensorium  Memory: Immediate Good; Recent Good; Remote Good  Judgment: Fair  Insight: Fair   Art therapist  Concentration: Good  Attention Span: Good  Recall: Good  Fund of Knowledge: Good  Language: Good   Psychomotor Activity  Psychomotor Activity: Psychomotor Activity: Normal   Assets  Assets: Communication Skills; Desire for Improvement; Leisure Time    Sleep  Sleep: Sleep: Good Number of Hours of Sleep: 8   Physical Exam: Physical Exam Vitals and nursing note reviewed.  Eyes:     Pupils: Pupils are equal, round, and reactive to light.  Pulmonary:     Effort: Pulmonary effort is normal.  Skin:    General: Skin is dry.  Neurological:     Mental Status: She is alert and oriented to person, place, and time.    Review of Systems  Psychiatric/Behavioral:  Positive for hallucinations and suicidal ideas. The patient is nervous/anxious.   All other systems reviewed and are negative.  Blood pressure (!) 167/73, pulse (!) 128, temperature (!) 97.1 F (36.2 C), temperature source Temporal, resp. rate 18, height 6'  (1.829 m), weight (!) 152.9 kg, SpO2 100%. Body mass index is 45.71 kg/m.  Medical Decision Making: Patient case reviewed and discussed with Dr Lucianne Muss. Patient is a danger to herself and she is acutely psychotic.  She requires inpatient psychiatric hospitalization for stabilization and treatment.   Problem 1: Schizoaffective Disorder -Zyprexa 20mg  Q HS -Lamotrige 150mg  Q day   Disposition:  Recommend inpatient psychiatric hospitalization for stabilization and treatment.   Thomes Lolling, NP 06/14/2023 1:38 PM

## 2023-06-14 NOTE — Progress Notes (Signed)
LCSW Progress Note  [id number]   Cindy Phillips  06/14/2023  7:24 PM    Inpatient Behavioral Health Placement  Pt meets inpatient criteria per Joaquim Nam. There are no available beds within [facility] system per [facility] AC Rosey Bath, RN. Referral was sent to the following facilities;     Destination  Service Provider Address Phone [facility]  [address] [place], [place] [address] [phone]  [facility]  [address] [place] [address] [phone]  [facility]  [address] [place] [address] [phone]  [facility]  [address] [place], [place] [address] [phone]  [facility]  [address] [place] [address] [phone]  [facility]  [address], [place] [address] [phone]  [facility]  [address] [place] [address] [phone]  [facility]  [address], [place] [address] [phone]  [facility]  [address] [place] [address] [phone]  [facility]  [address] [place], [place] [address] [phone]  [facility]  [address], [place] [address] [phone]  [facility]  [address] [place], [place] [address] [phone]  [facility] Inpatient [facility]  [place] [phone]  [facility]  [address] [place] [address] [phone]  [facility]  [address] [place], [place] [phone]  [facility]  [address] [place] [address] [phone]  [facility] [address]  [address], [place] [address] [phone]  [facility]  [address] [place] [address] [phone]  [facility]  [address], [place] [address] [phone]  [facility]  [address] [place], [place] [address] [phone]  [facility]  [address], [place] [address] [phone]  [facility]  [address], [place] [address] [phone]     Situation ongoing,  CSW will follow up.    Maryjean Ka, MSW, [facility] 06/14/2023 7:24 PM

## 2023-06-14 NOTE — ED Notes (Signed)
IVC is current

## 2023-06-14 NOTE — ED Provider Notes (Signed)
Emergency Medicine Observation Re-evaluation Note  Cindy Phillips is a 32 y.o. female, seen on rounds today.  Pt initially presented to the ED for complaints of No chief complaint on file. Currently, the patient is sitting on the bed no current complaints other than the fact that she is here would like to go Patient has a history of bipolar disorder.  She was brought in to be hacked yesterday by [employer].  She was IVC.  Patient became agitated was punching the walls and would not talk to them.  She was sent to the ED due to agitation and violence  Physical Exam  BP (!) 167/73 (BP Location: Right Leg)   Pulse (!) 128   Temp (!) 97.1 F (36.2 C) (Temporal)   Resp 18   Ht 1.829 m (6')   Wt (!) 152.9 kg   SpO2 100%   BMI 45.71 kg/m  Physical Exam General: Well-developed well-nourished female sitting in the bed does not appear to be in any acute distress Cardiac: Patient was tachycardic in the last vital signs but is not tachycardic on my exam Lungs: No respiratory distress normal respiratory rate oxygen saturations 100 Psych: Patient asked if she is here why court order.  She is told that she is she voiced displeasure with this but remained calm  ED Course / MDM  EKG:   I have reviewed the labs performed to date as well as medications administered while in observation.  Recent changes in the last 24 hours include patient has been given diphenhydramine, Versed, and droperidol  Plan  Current plan is for awaiting psychiatric consultation.    Margarita Grizzle, MD 06/14/23 [phone]

## 2023-06-15 NOTE — ED Provider Notes (Signed)
Approached by RN with report the patient attempted to run from the department requiring physical restraint. She is here for aggressive behavior, awaiting placement for acute psychosis.   Order for soft violent restraints placed for patient safety and staff safety.    Elpidio Anis, PA-C 06/15/23 1329    Durwin Glaze, MD 06/15/23 2009

## 2023-06-15 NOTE — ED Provider Notes (Signed)
Emergency Medicine Observation Re-evaluation Note  Cindy Phillips is a 32 y.o. female, seen on rounds today.  Pt initially presented to the ED for complaints of Aggressive Behavior Currently, the patient is lying in bed resting comfortably.  Physical Exam  BP 95/64   Pulse 90   Temp 99 F (37.2 C) (Oral)   Resp 16   Ht 6' (1.829 m)   Wt (!) 152.9 kg   SpO2 100%   BMI 45.71 kg/m  Physical Exam General: No acute distress Cardiac: Regular rate Lungs: No respiratory distress Psych: Resting comfortably  ED Course / MDM  EKG:EKG Interpretation Date/Time:  Friday June 14 2023 14:23:03 EDT Ventricular Rate:  107 PR Interval:  132 QRS Duration:  80 QT Interval:  298 QTC Calculation: 397 R Axis:   75  Text Interpretation: Sinus tachycardia with occasional Premature ventricular complexes Nonspecific T wave abnormality Abnormal ECG When compared with ECG of 10-May-2023 19:53, PREVIOUS ECG IS PRESENT Confirmed by Margarita Grizzle [phone]) on 06/14/2023 2:34:08 PM  I have reviewed the labs performed to date as well as medications administered while in observation.  Recent changes in the last 24 hours include the patient was evaluated by psychiatry yesterday and inpatient psychiatric treatment is recommended.  Plan  Current plan is for psychiatric placement for ongoing treatment.    Durwin Glaze, MD 06/15/23 [phone]

## 2023-06-15 NOTE — ED Notes (Signed)
Patient refuse her lamotrigine

## 2023-06-15 NOTE — Progress Notes (Signed)
LCSW Progress Note  [id number]   Cindy Phillips  06/15/2023  1:50 PM    Inpatient Behavioral Health Placement  Pt meets inpatient criteria per Hillery Jacks, NP. There are no available beds within [facility] per [facility] Kelly Southard,RN. Referral was sent to the following facilities;   Destination  Service Provider Address Phone [facility]  [address] [place], [place] [address] [phone]  [facility]  [address] [place] [address] [phone]  [facility]  [address] [place] [address] [phone]  [facility]  [address] [place], [place] [address] [phone]  [facility]  [address] [place] [address] [phone]  [facility]  [address] [place] [address] [phone]  [facility]  [address] [place] [address] [phone]  [facility]  [address] [place] [address] [phone]  [facility]  [address], [place] [address] [phone]  [facility]  [address] [place], [place] [address] [phone]  [facility]  [address], [place] [address] [phone]  [facility]  [address] [place], [place] [address] [phone]  [facility]  [place] [phone]  [facility]  [address] [place] [address] [phone]  [facility]  [address] [place], [place] [phone]  [facility]  [address], [place] [address] [phone]  [facility] [address]  [address] [place] [address] [phone]  [facility]  [address] [place] [address] [phone]  [facility]  [address] [place] [address] [phone]  [facility]  [address], [place] [address] [phone]  [facility]  [address], [place] [address] [phone]  [facility]  [address] [place] [address] [phone]  [facility] Patient Placement  [facility] [place], [place] [phone]  [facility]  [place] [phone]  [facility]  [address] [place] [address] [phone]  [facility]  [address] [place] [address] [phone]  [facility]  [facility], [place] [phone]  [facility]  [address] [place] [address] [phone]  [facility]  [address] [place] [address] [phone]  [facility]  [address] [place], [place] [address] [phone]    Situation ongoing,  CSW will follow up.    Maryjean Ka, MSW, LCSWA 06/15/2023 1:50 PM

## 2023-06-15 NOTE — Progress Notes (Cosign Needed)
Cindy Phillips  06/15/2023 11:30 AM Cindy Phillips  MRN:  [id number]   Subjective:  Cindy Phillips stated " I am want to go home."  Evaluation: Cindy Phillips 32 year old is under involuntary commitment due to "auditory hallucinations telling her to jump off a bridge."    Cindy Phillips was seen and evaluated face-to-face by this provider.  She presents irritable throughout this assessment.  States I am ready to go home.  She does admit homicidal ideation towards family members.  However, did not elaborate on who particular that she has experiencing thoughts about harming.  She reports she is followed by therapy and psychiatry services.  States her therapist name is Cindy Phillips unable to recall the last time she had a follow-up appointment.  "They tell me I was court ordered to be here."  Per nursing staff patient had refused morning medications with Lamictal.  She reports she resides with her mother and grandmother.  She carries a diagnosis with schizoaffective disorder bipolar type, and intellectual disorder.  Patient was initially recommended for inpatient admission.  Psychiatry to continue to follow.  RN staff to continue to encourage medication at this time.  This provider attempted to contact patient's mother Cindy Phillips at [phone] without answer.     Principal Problem: Schizoaffective disorder, bipolar type (HCC) Diagnosis: Principal Problem:   Schizoaffective disorder, bipolar type (HCC)  Total Time spent with patient: 15 minutes  Past Psychiatric History:   Past Medical History:  Past Medical History:  Diagnosis Date   Arthritis    Bipolar 1 disorder (HCC)    Bowel incontinence    Depression    Diabetes mellitus without complication (HCC)    Headache    migraines   Mental disability    Sleep apnea     Past Surgical History:  Procedure Laterality Date   ANTERIOR CRUCIATE LIGAMENT REPAIR Right 05/11/2022   Procedure: RIGHT KNEE ARTHROSCOPY; LATERAL MENISECTOMY; ANTERIOR  CRUCIATE LIGAMENT (ACL) REPAIR; CHONDROPLASTY;  Surgeon: Frederico Hamman, MD;  Location: [facility];  Service: Orthopedics;  Laterality: Right;   FOOT SURGERY     KNEE ARTHROSCOPY W/ ACL RECONSTRUCTION Left 10/12/2020   TOOTH EXTRACTION     Family History:  Family History  Problem Relation Age of Onset   High blood pressure Mother    Anxiety disorder Mother    Diabetes Maternal Grandmother    Family Psychiatric  History:  Social History:  Social History   Substance and Sexual Activity  Alcohol Use Not Currently     Social History   Substance and Sexual Activity  Drug Use No    Social History   Socioeconomic History   Marital status: Single    Spouse name: Not on file   Number of children: 0   Years of education: Not on file   Highest education level: Not on file  Occupational History   Occupation: Nature conservation officer     Comment: [employer]   Tobacco Use   Smoking status: Never   Smokeless tobacco: Never  Vaping Use   Vaping status: Never Used  Substance and Sexual Activity   Alcohol use: Not Currently   Drug use: No   Sexual activity: Not Currently  Other Topics Concern   Not on file  Social History Narrative   Not on file   Social Determinants of Health   Financial Resource Strain: Not on file  Food Insecurity: No Food Insecurity (04/06/2023)   Hunger Vital Sign    Worried About Running Out of Food in the  Last Year: Never true    Ran Out of Food in the Last Year: Never true  Transportation Needs: No Transportation Needs (04/06/2023)   PRAPARE - Administrator, Civil Service (Medical): No    Lack of Transportation (Non-Medical): No  Physical Activity: Not on file  Stress: Not on file  Social Connections: Not on file   Additional Social History:                         Sleep:  resting in bed  Appetite:  Fair  Current Medications: Current Facility-Administered Medications  Medication Dose Route Frequency Provider Last Rate Last Admin    diphenhydrAMINE (BENADRYL) capsule 50 mg  50 mg Oral Q6H PRN Weber, Kyra A, NP       Or   diphenhydrAMINE (BENADRYL) injection 50 mg  50 mg Intramuscular Q6H PRN Weber, Kyra A, NP       haloperidol (HALDOL) tablet 5 mg  5 mg Oral Q6H PRN Weber, Kyra A, NP       Or   haloperidol lactate (HALDOL) injection 5 mg  5 mg Intramuscular Q6H PRN Weber, Kyra A, NP       lamoTRIgine (LAMICTAL) tablet 150 mg  150 mg Oral Daily Weber, Kyra A, NP   150 mg at 06/14/23 0932   LORazepam (ATIVAN) tablet 2 mg  2 mg Oral Q6H PRN Weber, Kyra A, NP   2 mg at 06/14/23 0931   Or   LORazepam (ATIVAN) injection 2 mg  2 mg Intramuscular Q6H PRN Weber, Kyra A, NP       OLANZapine zydis (ZYPREXA) disintegrating tablet 20 mg  20 mg Oral QHS Weber, Kyra A, NP   20 mg at 06/14/23 2207   Current Outpatient Medications  Medication Sig Dispense Refill   amoxicillin (AMOXIL) 875 MG tablet Take 875 mg by mouth 2 (two) times daily. (Patient not taking: Reported on 05/10/2023)     atorvastatin (LIPITOR) 40 MG tablet Take 40 mg by mouth every evening.     baclofen (LIORESAL) 10 MG tablet Take 10 mg by mouth.     celecoxib (CELEBREX) 200 MG capsule Take 200 mg by mouth 2 (two) times daily.     diclofenac Sodium (VOLTAREN) 1 % GEL      doxycycline (VIBRAMYCIN) 100 MG capsule Take 1 capsule (100 mg total) by mouth 2 (two) times daily. (Patient not taking: Reported on 05/10/2023) 20 capsule 0   gabapentin (NEURONTIN) 300 MG capsule Take 300 mg by mouth every evening.     hydrOXYzine (ATARAX) 50 MG tablet Take 1 tablet (50 mg total) by mouth 2 (two) times daily as needed for anxiety.     ibuprofen (ADVIL) 600 MG tablet Take 600 mg by mouth every 6 (six) hours as needed. (Patient not taking: Reported on 05/10/2023)     lamoTRIgine (LAMICTAL) 100 MG tablet Take 100 mg by mouth daily.     lamoTRIgine (LAMICTAL) 150 MG tablet Take 150 mg by mouth daily.     LINZESS 72 MCG capsule Take 72 mcg by mouth every morning.     medroxyPROGESTERone  (DEPO-PROVERA) 150 MG/ML injection Inject 1 mL (150 mg total) into the muscle every 3 (three) months. For prevention of pregnancy 1 mL    metFORMIN (GLUCOPHAGE) 500 MG tablet Take one tablet by mouth in the morning and one tablet by mouth in the evening (Patient taking differently: Take 500 mg by mouth daily with breakfast.) 60  tablet 0   naproxen (NAPROSYN) 250 MG tablet Take by mouth.     OLANZapine (ZYPREXA) 20 MG tablet Take 20 mg by mouth at bedtime.     OLANZapine zydis (ZYPREXA) 10 MG disintegrating tablet Take 1 tablet (10 mg total) by mouth at bedtime. 30 tablet 0    Lab Results:  Results for orders placed or performed during the hospital encounter of 06/13/23 (from the past 48 hour(s))  Comprehensive metabolic panel     Status: Abnormal   Collection Time: 06/13/23  8:24 PM  Result Value Ref Range   Sodium 136 135 - 145 mmol/L   Potassium 4.0 3.5 - 5.1 mmol/L   Chloride 103 98 - 111 mmol/L   CO2 17 (L) 22 - 32 mmol/L   Glucose, Bld 107 (H) 70 - 99 mg/dL    Comment: Glucose reference range applies only to samples taken after fasting for at least 8 hours.   BUN 7 6 - 20 mg/dL   Creatinine, Ser 5.95 (H) 0.44 - 1.00 mg/dL   Calcium 9.7 8.9 - 63.8 mg/dL   Total Protein 8.6 (H) 6.5 - 8.1 g/dL   Albumin 4.5 3.5 - 5.0 g/dL   AST 23 15 - 41 U/L   ALT 18 0 - 44 U/L   Alkaline Phosphatase 107 38 - 126 U/L   Total Bilirubin 1.1 0.3 - 1.2 mg/dL   GFR, Estimated >75 >64 mL/min    Comment: (Phillips) Calculated using the CKD-EPI Creatinine Equation (2021)    Anion gap 16 (H) 5 - 15    Comment: Performed at [facility], [address], [place], [place] [address]  Ethanol     Status: None   Collection Time: 06/13/23  8:24 PM  Result Value Ref Range   Alcohol, Ethyl (B) <10 <10 mg/dL    Comment: (Phillips) Lowest detectable limit for serum alcohol is 10 mg/dL.  For medical purposes only. Performed at [facility], [address] [place], [place] [address]   CBC with Diff      Status: Abnormal   Collection Time: 06/13/23  8:24 PM  Result Value Ref Range   WBC 15.8 (H) 4.0 - 10.5 K/uL   RBC 4.59 3.87 - 5.11 MIL/uL   Hemoglobin 12.7 12.0 - 15.0 g/dL   HCT 66.0 63.0 - 16.0 %   MCV 87.8 80.0 - 100.0 fL   MCH 27.7 26.0 - 34.0 pg   MCHC 31.5 30.0 - 36.0 g/dL   RDW 10.9 32.3 - 55.7 %   Platelets 295 150 - 400 K/uL   nRBC 0.0 0.0 - 0.2 %   Neutrophils Relative % 77 %   Neutro Abs 12.0 (H) 1.7 - 7.7 K/uL   Lymphocytes Relative 17 %   Lymphs Abs 2.6 0.7 - 4.0 K/uL   Monocytes Relative 6 %   Monocytes Absolute 1.0 0.1 - 1.0 K/uL   Eosinophils Relative 0 %   Eosinophils Absolute 0.0 0.0 - 0.5 K/uL   Basophils Relative 0 %   Basophils Absolute 0.1 0.0 - 0.1 K/uL   Immature Granulocytes 0 %   Abs Immature Granulocytes 0.04 0.00 - 0.07 K/uL    Comment: Performed at [facility], [address] [place], [place] [address]  hCG, serum, qualitative     Status: None   Collection Time: 06/13/23  8:24 PM  Result Value Ref Range   Preg, Serum NEGATIVE NEGATIVE    Comment:  THE SENSITIVITY OF THIS METHODOLOGY IS >10 mIU/mL. Performed at [facility], [address], [place], [place] [address]   Urine rapid drug screen (hosp performed)     Status: Abnormal   Collection Time: 06/14/23  8:48 AM  Result Value Ref Range   Opiates NONE DETECTED NONE DETECTED   Cocaine NONE DETECTED NONE DETECTED   Benzodiazepines POSITIVE (A) NONE DETECTED   Amphetamines NONE DETECTED NONE DETECTED   Tetrahydrocannabinol NONE DETECTED NONE DETECTED   Barbiturates NONE DETECTED NONE DETECTED    Comment: (Phillips) DRUG SCREEN FOR MEDICAL PURPOSES ONLY.  IF CONFIRMATION IS NEEDED FOR ANY PURPOSE, NOTIFY LAB WITHIN 5 DAYS.  LOWEST DETECTABLE LIMITS FOR URINE DRUG SCREEN Drug Class                     Cutoff (ng/mL) Amphetamine and metabolites    1000 Barbiturate and metabolites    200 Benzodiazepine                 200 Opiates and metabolites        300 Cocaine and  metabolites        300 THC                            50 Performed at [facility], [address], [place], [place] [address]     Blood Alcohol level:  Lab Results  Component Value Date   [facility] <10 06/13/2023   ETH <10 05/10/2023    Metabolic Disorder Labs: Lab Results  Component Value Date   HGBA1C 6.4 (H) 04/07/2023   MPG 136.98 04/07/2023   MPG 116.89 05/02/2022   No results found for: "PROLACTIN" Lab Results  Component Value Date   CHOL 123 04/07/2023   TRIG 104 04/07/2023   HDL 39 (L) 04/07/2023   CHOLHDL 3.2 04/07/2023   VLDL 21 04/07/2023   LDLCALC 63 04/07/2023   LDLCALC 96 01/06/2020    Physical Findings: AIMS:  , ,  ,  ,    CIWA:    COWS:     Musculoskeletal: Strength & Muscle Tone: within normal limits Gait & Station: normal Patient leans: N/A  Psychiatric Specialty Exam:  Presentation  General Appearance:  Disheveled  Eye Contact: Fair  Speech: Clear and Coherent  Speech Volume: Normal  Handedness: Right   Mood and Affect  Mood: Depressed; Irritable  Affect: Congruent   Thought Process  Thought Processes: Coherent  Descriptions of Associations:Intact  Orientation:Full (Time, Place and Person)  Thought Content:WDL  History of Schizophrenia/Schizoaffective disorder:No data recorded Duration of Psychotic Symptoms:No data recorded Hallucinations:Hallucinations: Auditory; Visual Description of Auditory Hallucinations: Command hallucinations telling her to jump off a bridge Description of Visual Hallucinations: Patient did not elaborate  Ideas of Reference:None  Suicidal Thoughts:Suicidal Thoughts: Yes, Passive  Homicidal Thoughts:Homicidal Thoughts: Yes, Passive   Sensorium  Memory: Immediate Good; Recent Good; Remote Good  Judgment: Fair  Insight: Fair   Art therapist  Concentration: Good  Attention Span: Good  Recall: Good  Fund of Knowledge: Good  Language: Good   Psychomotor  Activity  Psychomotor Activity: Psychomotor Activity: Normal   Assets  Assets: Communication Skills; Desire for Improvement; Leisure Time   Sleep  Sleep: Sleep: Good Number of Hours of Sleep: 8    Physical Exam: Physical Exam Vitals and nursing Phillips reviewed.  Constitutional:      Appearance: Normal appearance.  Neurological:  Mental Status: She is alert and oriented to person, place, and time.  Psychiatric:        Mood and Affect: Mood normal.        Thought Content: Thought content normal.    Review of Systems  Psychiatric/Behavioral:  Positive for depression. The patient is nervous/anxious.   All other systems reviewed and are negative.  Blood pressure 95/64, pulse 90, temperature 99 F (37.2 C), temperature source Oral, resp. rate 16, height 6' (1.829 m), weight (!) 152.9 kg, SpO2 100%. Body mass index is 45.71 kg/m.   Treatment Plan Summary: Daily contact with patient to assess and evaluate symptoms and progress in treatment and Medication management  CSW continue seeking inpatient mission Patient encouraged to take medication as indicated. Consideration for long-acting injectable   Oneta Rack, NP 06/15/2023, 11:30 AM

## 2023-06-16 NOTE — Progress Notes (Addendum)
[facility] MD Progress Note  06/16/2023 12:23 PM Cindy Phillips  MRN:  [id number]  Subjective:   Cindy Phillips was seen and evaluated face-to-face by this provider.  Patient observed resting in bed.  She continues to endorse homicidal ideations.  Denied thoughts of wanting to hurt himself or anybody else.  Per nursing staff patient has been taking medications as directed.  She reports a good appetite.  States she is resting well throughout the night.  Cindy Phillips reports she did speak to her cousin on yesterday who stated that she would bring her some clothing.  She reports that her mother's telephone is disconnected however she stated that her telephone will be back on later today.  Patient has been calm, cooperative no overt behaviors noted today.  Consider safety planning.  Attempted to contact mother no answer.  Staff to continue to monitor for safety.  Support encouragement reassurance was provided.   As noted by nursing staff-Pt said her grandmother is the only one who as a phone. she said the mother and the grandmother live together. Steffanie Rainwater (grandmother) [phone]. the patient said her moms phone is not working.    Principal Problem: Schizoaffective disorder, bipolar type (HCC) Diagnosis: Principal Problem:   Schizoaffective disorder, bipolar type (HCC)  Total Time spent with patient: 15 minutes  Past Psychiatric History: Documented history with schizoaffective disorder bipolar type, intellectual disability, aggressive behavior, major depressive disorder, generalized anxiety disorder.  Currently prescribed Lamictal 150 mg daily  Past Medical History:  Past Medical History:  Diagnosis Date   Arthritis    Bipolar 1 disorder (HCC)    Bowel incontinence    Depression    Diabetes mellitus without complication (HCC)    Headache    migraines   Mental disability    Sleep apnea     Past Surgical History:  Procedure Laterality Date   ANTERIOR CRUCIATE LIGAMENT REPAIR Right 05/11/2022    Procedure: RIGHT KNEE ARTHROSCOPY; LATERAL MENISECTOMY; ANTERIOR CRUCIATE LIGAMENT (ACL) REPAIR; CHONDROPLASTY;  Surgeon: Frederico Hamman, MD;  Location: [facility];  Service: Orthopedics;  Laterality: Right;   FOOT SURGERY     KNEE ARTHROSCOPY W/ ACL RECONSTRUCTION Left 10/12/2020   TOOTH EXTRACTION     Family History:  Family History  Problem Relation Age of Onset   High blood pressure Mother    Anxiety disorder Mother    Diabetes Maternal Grandmother    Family Psychiatric  History:  Social History:  Social History   Substance and Sexual Activity  Alcohol Use Not Currently     Social History   Substance and Sexual Activity  Drug Use No    Social History   Socioeconomic History   Marital status: Single    Spouse name: Not on file   Number of children: 0   Years of education: Not on file   Highest education level: Not on file  Occupational History   Occupation: Nature conservation officer     Comment: [employer]   Tobacco Use   Smoking status: Never   Smokeless tobacco: Never  Vaping Use   Vaping status: Never Used  Substance and Sexual Activity   Alcohol use: Not Currently   Drug use: No   Sexual activity: Not Currently  Other Topics Concern   Not on file  Social History Narrative   Not on file   Social Determinants of Health   Financial Resource Strain: Not on file  Food Insecurity: No Food Insecurity (04/06/2023)   Hunger Vital Sign    Worried About Running  Out of Food in the Last Year: Never true    Ran Out of Food in the Last Year: Never true  Transportation Needs: No Transportation Needs (04/06/2023)   PRAPARE - Administrator, Civil Service (Medical): No    Lack of Transportation (Non-Medical): No  Physical Activity: Not on file  Stress: Not on file  Social Connections: Not on file   Additional Social History:                         Sleep: Fair  Appetite:  Good  Current Medications: Current Facility-Administered Medications  Medication Dose  Route Frequency Provider Last Rate Last Admin   diphenhydrAMINE (BENADRYL) capsule 50 mg  50 mg Oral Q6H PRN Weber, Kyra A, NP       Or   diphenhydrAMINE (BENADRYL) injection 50 mg  50 mg Intramuscular Q6H PRN Weber, Kyra A, NP   50 mg at 06/15/23 1352   haloperidol (HALDOL) tablet 5 mg  5 mg Oral Q6H PRN Weber, Kyra A, NP       Or   haloperidol lactate (HALDOL) injection 5 mg  5 mg Intramuscular Q6H PRN Weber, Kyra A, NP   5 mg at 06/15/23 1521   lamoTRIgine (LAMICTAL) tablet 150 mg  150 mg Oral Daily Weber, Kyra A, NP   150 mg at 06/16/23 1205   LORazepam (ATIVAN) tablet 2 mg  2 mg Oral Q6H PRN Weber, Kyra A, NP   2 mg at 06/15/23 2100   Or   LORazepam (ATIVAN) injection 2 mg  2 mg Intramuscular Q6H PRN Weber, Kyra A, NP   2 mg at 06/15/23 1315   OLANZapine zydis (ZYPREXA) disintegrating tablet 20 mg  20 mg Oral QHS Weber, Kyra A, NP   20 mg at 06/15/23 2100   Current Outpatient Medications  Medication Sig Dispense Refill   amoxicillin (AMOXIL) 875 MG tablet Take 875 mg by mouth 2 (two) times daily. (Patient not taking: Reported on 05/10/2023)     atorvastatin (LIPITOR) 40 MG tablet Take 40 mg by mouth every evening.     baclofen (LIORESAL) 10 MG tablet Take 10 mg by mouth.     celecoxib (CELEBREX) 200 MG capsule Take 200 mg by mouth 2 (two) times daily.     diclofenac Sodium (VOLTAREN) 1 % GEL      doxycycline (VIBRAMYCIN) 100 MG capsule Take 1 capsule (100 mg total) by mouth 2 (two) times daily. (Patient not taking: Reported on 05/10/2023) 20 capsule 0   gabapentin (NEURONTIN) 300 MG capsule Take 300 mg by mouth every evening.     hydrOXYzine (ATARAX) 50 MG tablet Take 1 tablet (50 mg total) by mouth 2 (two) times daily as needed for anxiety.     ibuprofen (ADVIL) 600 MG tablet Take 600 mg by mouth every 6 (six) hours as needed. (Patient not taking: Reported on 05/10/2023)     lamoTRIgine (LAMICTAL) 100 MG tablet Take 100 mg by mouth daily.     lamoTRIgine (LAMICTAL) 150 MG tablet Take 150  mg by mouth daily.     LINZESS 72 MCG capsule Take 72 mcg by mouth every morning.     medroxyPROGESTERone (DEPO-PROVERA) 150 MG/ML injection Inject 1 mL (150 mg total) into the muscle every 3 (three) months. For prevention of pregnancy 1 mL    metFORMIN (GLUCOPHAGE) 500 MG tablet Take one tablet by mouth in the morning and one tablet by mouth in the evening (Patient  taking differently: Take 500 mg by mouth daily with breakfast.) 60 tablet 0   naproxen (NAPROSYN) 250 MG tablet Take by mouth.     OLANZapine (ZYPREXA) 20 MG tablet Take 20 mg by mouth at bedtime.     OLANZapine zydis (ZYPREXA) 10 MG disintegrating tablet Take 1 tablet (10 mg total) by mouth at bedtime. 30 tablet 0    Lab Results: No results found for this or any previous visit (from the past 48 hour(s)).  Blood Alcohol level:  Lab Results  Component Value Date   ETH <10 06/13/2023   ETH <10 05/10/2023    Metabolic Disorder Labs: Lab Results  Component Value Date   HGBA1C 6.4 (H) 04/07/2023   MPG 136.98 04/07/2023   MPG 116.89 05/02/2022   No results found for: "PROLACTIN" Lab Results  Component Value Date   CHOL 123 04/07/2023   TRIG 104 04/07/2023   HDL 39 (L) 04/07/2023   CHOLHDL 3.2 04/07/2023   VLDL 21 04/07/2023   LDLCALC 63 04/07/2023   LDLCALC 96 01/06/2020    Physical Findings: AIMS:  , ,  ,  ,    CIWA:    COWS:     Musculoskeletal: Strength & Muscle Tone: within normal limits Gait & Station: normal Patient leans: N/A  Psychiatric Specialty Exam:  Presentation  General Appearance:  Disheveled  Eye Contact: Fair  Speech: Clear and Coherent  Speech Volume: Normal  Handedness: Right   Mood and Affect  Mood: Depressed; Irritable  Affect: Congruent   Thought Process  Thought Processes: Coherent  Descriptions of Associations:Intact  Orientation:Full (Time, Place and Person)  Thought Content:WDL  History of Schizophrenia/Schizoaffective disorder:No data  recorded Duration of Psychotic Symptoms:No data recorded Hallucinations:No data recorded Ideas of Reference:None  Suicidal Thoughts:No data recorded Homicidal Thoughts:No data recorded  Sensorium  Memory: Immediate Good; Recent Good; Remote Good  Judgment: Fair  Insight: Fair   Art therapist  Concentration: Good  Attention Span: Good  Recall: Good  Fund of Knowledge: Good  Language: Good   Psychomotor Activity  Psychomotor Activity:No data recorded  Assets  Assets: Communication Skills; Desire for Improvement; Leisure Time   Sleep  Sleep:No data recorded   Physical Exam: Physical Exam Vitals and nursing note reviewed.  Constitutional:      Appearance: Normal appearance.  Neurological:     Mental Status: She is alert and oriented to person, place, and time.  Psychiatric:        Mood and Affect: Mood normal.        Behavior: Behavior normal.    ROS Blood pressure 105/75, pulse (!) 105, temperature 97.9 F (36.6 C), temperature source Oral, resp. rate 18, height 6' (1.829 m), weight (!) 152.9 kg, SpO2 98%. Body mass index is 45.71 kg/m.   Treatment Plan Summary: Continue seeking inpatient admission -Attempted to contact patient's mother for additional collateral-was reported that patient mother's telephone will be on later today.  Oneta Rack, NP 06/16/2023, 12:23 PM

## 2023-06-16 NOTE — ED Notes (Signed)
IVC paperwork verified and current.  IVC documents will expire on 06/20/2023.

## 2023-06-16 NOTE — ED Provider Notes (Signed)
Emergency Medicine Observation Re-evaluation Note  Cindy Phillips is a 32 y.o. female, seen on rounds today.  Pt initially presented to the ED for complaints of Aggressive Behavior Currently, the patient is sleeping.  Physical Exam  BP 105/75 (BP Location: Right Wrist) Comment: Retaken BP  Pulse (!) 105   Temp 97.9 F (36.6 C) (Oral)   Resp 18   Ht 6' (1.829 m)   Wt (!) 152.9 kg   SpO2 98%   BMI 45.71 kg/m  Physical Exam General: No acute distress Lungs: No respiratory distress noted Psych: Sleeping  ED Course / MDM  EKG:EKG Interpretation Date/Time:  Friday June 14 2023 14:23:03 EDT Ventricular Rate:  107 PR Interval:  132 QRS Duration:  80 QT Interval:  298 QTC Calculation: 397 R Axis:   75  Text Interpretation: Sinus tachycardia with occasional Premature ventricular complexes Nonspecific T wave abnormality Abnormal ECG When compared with ECG of 10-May-2023 19:53, PREVIOUS ECG IS PRESENT Confirmed by Margarita Grizzle [phone]) on 06/14/2023 2:34:08 PM  I have reviewed the labs performed to date as well as medications administered while in observation.  Recent changes in the last 24 hours include the patient had episodes of agitation yesterday afternoon but is currently calm.  Plan  Current plan is for continued search for psychiatric placement.  The patient remains on an IVC.Durwin Glaze, MD 06/16/23 [phone]

## 2023-06-16 NOTE — Progress Notes (Signed)
Patient has been denied by [facility] and has been faxed out. Patient meets BH inpatient criteria per Hillery Jacks, NP. Patient has been faxed out to the following facilities:   [facility]  [address] [place], [place] [address] [phone]  [facility]  [address] [place] [address] [phone]  [facility]  [address] [place] [address] [phone]  [facility]  [address] [place], [place] [address] [phone]  [facility]  [address] [place] [address] [phone]  [facility]  [address], [place] [address] [phone]  [facility]  [address] [place] [address] [phone]  [facility]  [address] [place] [address] [phone]  [facility]  [address] [place] [address] [phone]  [facility]  [address] [place], [place] [address] [phone]  [facility]  [address] [place] [address] [phone]  [facility]  [address] [place], [place] [address] [phone]  [facility]  [place] [phone]  [facility]  [address] [place] [address] [phone]  [facility]FAX  [address] [place], [place] [phone]  [facility]  [address] [place] [address] [phone]  [facility] [address]  [address], [place] [address] [phone]  [facility]  [address] [place] [address] [phone]   [facility]  [address], [place] [address] [phone]  [facility]  [address], [place] [address] [phone]  [facility]  [address] [place] [address] [phone]  [facility]  [address] [place] [address] [phone]  [facility] Patient Placement  [facility], [place] [phone]  [facility]  [place] [phone]  [facility]  [address] [place] [address] [phone]  [facility]  [address], [place] [address] [phone]  [facility]D [facility]  [facility], [place] [phone]  [facility]  [address] [place] [address] [phone]  [facility]  [address] [place] [address] [phone]  [facility]  [address] [place], [place] [address] [phone]   Damita Dunnings, MSW, LCSW-A  10:32 AM 06/16/2023

## 2023-06-17 DIAGNOSIS — F250 Schizoaffective disorder, bipolar type: Secondary | ICD-10-CM

## 2023-06-17 NOTE — Progress Notes (Signed)
CSW contacted patients mother [name], [phone] but was unable to reach her.   CSW spoke with patients grandmother Steffanie Rainwater, at [phone]. Ms. Christell Constant stated patient lives with her and her daughter [name]. Ms. Christell Constant stated it was fine to use safe transport or a cab to get patient home. Ms. Christell Constant stated their address is [address] [place]. CSW notified patients RN and EDP.

## 2023-06-17 NOTE — ED Provider Notes (Signed)
Patient has been cleared by psychiatry.  She is awake, alert, and does not appear psychotic.  Will rescind IVC and discharge.   [name], MD 06/17/23 [phone]

## 2023-06-17 NOTE — ED Notes (Signed)
Reviewed discharge instructions with patient. Follow-up care and medications reviewed. Patient verbalized understanding. Patient A&Ox4, VSS, and ambulatory with steady gait upon discharge. Pt transported to residence via safe transport.

## 2023-06-17 NOTE — Progress Notes (Addendum)
LCSW Progress Note  [id number]   Cindy Phillips  06/17/2023  11:32 AM  Description:   Inpatient Psychiatric Referral  Patient was recommended inpatient per Eligha Bridegroom NP There are no available beds at [facility], per [facility] Rona Ravens.  Patient was referred to the following out of network facilities: Production assistant, radio Address Phone Fax  [facility]  [address] [place], [place] [address] [phone]  [facility]  [address] [place] [address] [phone]  [facility]  [address] [place] [address] [phone]  [facility]  [address] [place], [place] [address] [phone]  [facility]  [address] [place] [address] [phone]  [facility]  [address], [place] [address] [phone]  [facility]  [address] [place] [address] [phone]  [facility]  [address] [place] [address] [phone]  [facility]  [address] [place] [address] [phone]  [facility]  [address] [place], [place] [address] [phone]  [facility]  [address] [place] [address] [phone]  [facility]  [address] [place], [place] [address] [phone]  [facility] Inpatient [facility]  [place] [phone]  [facility]  [address] [place] [address] [phone]  [facility] EFAX  [address] [place], [place] [phone]  [facility]  [address] [place] [address] [phone]  [facility] [address]  [address], [place] [address] [phone]  [facility]  [address] [place] [address] [phone]  [facility]  [address], [place] [address] [phone]  [facility]  [address], [place] [address] [phone]  [facility]  [address], [place] [address] [phone]  [facility]  [address] [place] [address] [phone]  [facility] Patient [facility]  [facility], [place] [phone]  [facility] Management Behavioral Health  [place] [phone]  [facility]  [address] [place] [address] [phone]  [facility]  [address], [place] [address] [phone]  [facility]D [facility]  [facility], [place] [phone]  [facility]  [address], [place] [address] [phone]  [facility]  [address] [place] [address] [phone]  [facility]  [address] [place], [place] [address] [phone]       Situation ongoing, CSW to continue following and update chart as more information becomes available.      Cindy Phillips [name] LCSW-A  06/17/2023 11:32 AM

## 2023-06-17 NOTE — Discharge Instructions (Signed)
Behavioral Health Resources:  Intensive Outpatient Programs: [facility]      [address] [place], [place] [phone] Both a day and evening program       [facility] Outpatient     [address]        [place], [place] [address] [phone]         ADS: Alcohol & Drug Svcs [address] [place] [phone]  [facility] Mental Health ACCESS LINE: [phone] or [phone] [address] [place], [place] [address] [email or web address]   Substance Abuse Resources: Alcohol and Drug Services  [phone] [facility] [phone] [facility] [place] [phone] Floydene Flock [phone] Residential & Outpatient Substance Abuse Program  [phone]  Psychological Services: [facility]  [phone] [facility]  [phone] [facility], [phone] [place]. [address] [place], ACCESS LINE: [phone] or [phone], [email or web address]  Mobile Crisis Teams:                                        Therapeutic Alternatives         Mobile Crisis Care Unit [phone]             [facility] [address] Ginette Otto [phone]                                         Interventionist [address] [place] [address] [place] [phone]  Self-Help/Support Groups: Mental Health Assoc. of [employer] of support groups [phone] (call for more info)  Narcotics Anonymous (NA) Caring Services [address] [place] - 2 meetings at this location  Residential Treatment Programs:  [facility]      [address]        [place]       [phone]         [facility] [address], [place] [address] [place], [place]  [address] [phone]  [facility]  [address] [place], [place]  [address] [phone] Admissions: 8am-3pm M-F  [facility]     [address]        [place], [place] [address]       [phone]         [facility] [address] [place], [place] [phone]  [facility] [address] [place], [place] [phone]  [facility] - Intensive Outpatient      [address]     [place], [place]       [phone]         [facility] (Addiction Recovery Care [facility]     [address] [place], [place] [phone] or [phone]  Residential Treatment Services (RTS), Medicaid [address] [place], [place] [phone]  [facility] [address]                                               [address] [place] [phone]  [facility] Resources: Estate manager/land agent [phone]               General Therapy  Angie Fava, PhD        [address] [place], [place] [address]         [phone]   Insurance  [facility] Behavioral   [address] [place], [place] [address] [phone]  [facility] [address] [place], [place] [address] [phone] Insurance/Medicaid/sponsorship through [facility] and Families                                              [address]                                        [place], [place] [address]    Therapy/tele-psych/case         [phone]          [facility] [address], [place]  [address]  Adolescent/group home/case management [phone]                                           Creola Corn PhD       General therapy       Insurance   [phone]         Dr. Lolly Mustache, Insurance, M-F [phone]  [facility]  [employer] [facility] Dept. [address]                 [address]         [address] [place] [address]  Blondell Reveal Phone:  [phone]                                  Phone:  [phone]                   Phone:  [phone]  [facility], [phone] [phone]       -     [facility] in [employer] [place], [address],             [phone], [employer] Health Resources:  Intensive Outpatient Programs: [facility]      [address] [place], [place] [phone] Both a day and evening program       [facility] Outpatient     [address]  [place], [place] [address] [phone]         ADS: Alcohol & Drug Svcs [address] [place] [phone]  [facility] Mental Health ACCESS LINE: [phone] or [phone] [address] [place], [place] [address] [email or web address]   Substance Abuse Resources: [facility] Drug [facility]  [phone] [facility] [phone] [facility] [place] [phone] Floydene Flock [phone] Residential & Outpatient Substance Abuse Program  [phone]  Psychological Services: [facility] Health  [phone] [facility]  [phone] [facility], [phone] [place]. [address] [place], ACCESS LINE: [phone] or [phone], [email or web address]  Mobile Crisis Teams:                                        Therapeutic Alternatives         Mobile Crisis Care Unit [phone]             [facility] [address] Ginette Otto [phone]                                         Interventionist [address] DeE[address] [place] [phone]  Self-Help/Support Groups: Mental Health Assoc. of [employer] of support groups [phone] (call for more  info)  Narcotics Anonymous (NA) [facility] [address] [place] - 2 meetings at this location  Residential Treatment Programs:  [facility]      [address]        [place]       [phone]         [facility] [address], [place] [address] [place], [place]  [address] [phone]  [facility]  [address] [place], [place] [address] [phone] Admissions: 8am-3pm M-F  [facility]     [address]        [place], [place] [address]       [phone]         [facility] [address] [place], [place] [phone]  [facility] [address] [place], [place] [phone]  [facility] - Intensive Outpatient      [address]     [place], [place]       [phone]         [facility] (Addiction Recovery [facility]     [address] [place], [place] [phone] or [phone]  Residential Treatment Services (RTS), Medicaid [address] [place], [place] [phone]  [facility] [address]                                               [address] [place] [phone]  [facility] Resources: [facility]256 [phone]               General Therapy                                                Angie Fava, PhD  [address] [place], [place] [address]         [phone]   Insurance  [facility] Behavioral   [address] [place], [place] [address] [phone]  [facility] [address] [place], [place] [address] [phone] Insurance/Medicaid/sponsorship through [facility] and Families                                              [address]                                        [place], [place] [address]    Therapy/tele-psych/case         [phone]          [facility] [address],  [place]  [address]  Adolescent/group home/case management [phone]                                           Creola Corn PhD       General therapy       Insurance   [phone]         Dr. Lolly Mustache, Insurance, M-F [phone]  [facility]  United Way [facility] Dept. [address]                 [address]         [address] [place] [address]  Blondell Reveal Phone:  [phone]                                  Phone:  [phone]                   Phone:  [phone]  [facility], [phone] [facility]- [phone]       -     [facility] in [place], [address],             [phone], Insurance

## 2023-06-17 NOTE — Progress Notes (Signed)
Patient has been PSYCH cleared per Serina Cowper NP. CSW will remove patient from [facility] shift report.  Cindy Abbygail [place] LCSW-A   06/17/2023 12:44 PM

## 2023-06-17 NOTE — Discharge Summary (Cosign Needed Addendum)
Cindy Phillips  MRN:  [id number]  Principal Problem: Schizoaffective disorder, bipolar type [facility]) Discharge Diagnoses: Principal Problem:   Schizoaffective disorder, bipolar type [facility])  Clinical Impression:  Final diagnoses:  Acute psychosis (HCC)  Aggressive behavior   Subjective:  [facility] seen at [facility] ED for face-to-face psychiatric reevaluation.  Appears patient first arrived on 10/25 with past history of schizoaffective disorder, bipolar type and intellectual disorder with complaints of auditory hallucinations telling her to jump off a bridge.  Per chart review does appear patient has presented to ED/UC a few times in the past few months for SI and behaviors at home.  She lives with her mother and grandmother.  It does appear for the past 2 days patient has been compliant with her medications, requesting to return home, and denying SI/HI/AVH.  Today she continues to deny any suicidal ideations.  Denies homicidal ideations.  Patient stated "my family makes me upset but I don't actually want to hurt them."  She denies any auditory or visual hallucinations.  Patient stated, "I have been good and feeling better and I want to go home."  She is able to contract for safety at this time, stating she does not want to hurt herself or anyone else.  She states she will be compliant with her medications at home.  She does have outpatient and therapy follow-up already established.  I attempted to call her mother at [phone].  Patient stated sometimes her mother does not have cell phone service and is not sure if I would be able to reach her.  She told me to call her grandmother is back up.  So I attempted to call her grandmother, Cindy Phillips, at [phone] however she did not answer either.  During evaluation Cindy Phillips is sitting in no acute distress.  She is alert, oriented x 4, calm, cooperative and attentive.  Her mood is euthymic  with congruent affect.  She has normal speech, and behavior.  Objectively there is no evidence of psychosis/mania or delusional thinking.  Patient is able to converse coherently, goal directed thoughts, no distractibility, or pre-occupation.  She also denies suicidal/self-harm/homicidal ideation, psychosis, and paranoia.  Patient answered question appropriately.   At this time patient is not meeting any IVC or inpatient psychiatric criteria.  At this time I do not think patient would benefit from inpatient psychiatric services and needs to continue following up with her outpatient providers.  I have requested TOC order to help assist with coordination for discharge.  Patient is psych cleared to discharge home.  ED Assessment Time Calculation: Start Time: 1200 Stop Time: 1235 Total Time in Minutes (Assessment Completion): 35    Past Medical History:  Past Medical History:  Diagnosis Date   Arthritis    Bipolar 1 disorder (HCC)    Bowel incontinence    Depression    Diabetes mellitus without complication (HCC)    Headache    migraines   Mental disability    Sleep apnea     Past Surgical History:  Procedure Laterality Date   ANTERIOR CRUCIATE LIGAMENT REPAIR Right 05/11/2022   Procedure: RIGHT KNEE ARTHROSCOPY; LATERAL MENISECTOMY; ANTERIOR CRUCIATE LIGAMENT (ACL) REPAIR; CHONDROPLASTY;  Surgeon: Frederico Hamman, MD;  Location: [facility]S;  Service: Orthopedics;  Laterality: Right;   FOOT SURGERY     KNEE ARTHROSCOPY W/ ACL RECONSTRUCTION Left 10/12/2020   TOOTH EXTRACTION     Family History:  Family History  Problem Relation Age  of Onset   High blood pressure Mother    Anxiety disorder Mother    Diabetes Maternal Grandmother    Family Psychiatric  History: unknown Social History:  Social History   Substance and Sexual Activity  Alcohol Use Not Currently     Social History   Substance and Sexual Activity  Drug Use No    Social History   Socioeconomic History   Marital  status: Single    Spouse name: Not on file   Number of children: 0   Years of education: Not on file   Highest education level: Not on file  Occupational History   Occupation: Nature conservation officer     Comment: [employer]   Tobacco Use   Smoking status: Never   Smokeless tobacco: Never  Vaping Use   Vaping status: Never Used  Substance and Sexual Activity   Alcohol use: Not Currently   Drug use: No   Sexual activity: Not Currently  Other Topics Concern   Not on file  Social History Narrative   Not on file   Social Determinants of Health   Financial Resource Strain: Not on file  Food Insecurity: No Food Insecurity (04/06/2023)   Hunger Vital Sign    Worried About Running Out of Food in the Last Year: Never true    Ran Out of Food in the Last Year: Never true  Transportation Needs: No Transportation Needs (04/06/2023)   PRAPARE - Administrator, Civil Service (Medical): No    Lack of Transportation (Non-Medical): No  Physical Activity: Not on file  Stress: Not on file  Social Connections: Not on file    Tobacco Cessation:  N/A, patient does not currently use tobacco products  Current Medications: Current Facility-Administered Medications  Medication Dose Route Frequency Provider Last Rate Last Admin   diphenhydrAMINE (BENADRYL) capsule 50 mg  50 mg Oral Q6H PRN Weber, Kyra A, NP       Or   diphenhydrAMINE (BENADRYL) injection 50 mg  50 mg Intramuscular Q6H PRN Weber, Kyra A, NP   50 mg at 06/15/23 1352   haloperidol (HALDOL) tablet 5 mg  5 mg Oral Q6H PRN Weber, Kyra A, NP       Or   haloperidol lactate (HALDOL) injection 5 mg  5 mg Intramuscular Q6H PRN Weber, Kyra A, NP   5 mg at 06/15/23 1521   lamoTRIgine (LAMICTAL) tablet 150 mg  150 mg Oral Daily Weber, Kyra A, NP   150 mg at 06/16/23 1205   LORazepam (ATIVAN) tablet 2 mg  2 mg Oral Q6H PRN Weber, Kyra A, NP   2 mg at 06/15/23 2100   Or   LORazepam (ATIVAN) injection 2 mg  2 mg Intramuscular Q6H PRN Weber, Kyra A,  NP   2 mg at 06/15/23 1315   OLANZapine zydis (ZYPREXA) disintegrating tablet 20 mg  20 mg Oral QHS Weber, Kyra A, NP   20 mg at 06/16/23 2226   Current Outpatient Medications  Medication Sig Dispense Refill   atorvastatin (LIPITOR) 40 MG tablet Take 40 mg by mouth every evening.     baclofen (LIORESAL) 10 MG tablet Take 10 mg by mouth.     celecoxib (CELEBREX) 200 MG capsule Take 200 mg by mouth 2 (two) times daily.     gabapentin (NEURONTIN) 300 MG capsule Take 300 mg by mouth every evening.     ibuprofen (ADVIL) 600 MG tablet Take 600 mg by mouth every 6 (six) hours as needed.  lamoTRIgine (LAMICTAL) 150 MG tablet Take 150 mg by mouth daily.     LINZESS 72 MCG capsule Take 72 mcg by mouth every morning.     medroxyPROGESTERone (DEPO-PROVERA) 150 MG/ML injection Inject 1 mL (150 mg total) into the muscle every 3 (three) months. For prevention of pregnancy 1 mL    metFORMIN (GLUCOPHAGE) 500 MG tablet Take one tablet by mouth in the morning and one tablet by mouth in the evening (Patient taking differently: Take 500 mg by mouth daily with breakfast.) 60 tablet 0   naproxen (NAPROSYN) 250 MG tablet Take by mouth.     OLANZapine (ZYPREXA) 20 MG tablet Take 20 mg by mouth at bedtime.     diclofenac Sodium (VOLTAREN) 1 % GEL      hydrOXYzine (ATARAX) 50 MG tablet Take 1 tablet (50 mg total) by mouth 2 (two) times daily as needed for anxiety.     OLANZapine zydis (ZYPREXA) 10 MG disintegrating tablet Take 1 tablet (10 mg total) by mouth at bedtime. (Patient taking differently: Take 15 mg by mouth at bedtime.) 30 tablet 0   PTA Medications: (Not in a hospital admission)   [place] Scale:  Flowsheet Row ED [facility] ED from 05/10/2023 in [facility] Emergency Department at [facility] Admission (Discharged) [facility]  C-SSRS RISK CATEGORY No Risk High Risk High Risk        Musculoskeletal: Strength & Muscle Tone: within normal limits Gait & Station: normal Patient leans: N/A  Psychiatric Specialty Exam: Presentation  General Appearance:  Appropriate for Environment  Eye Contact: Good  Speech: Clear and Coherent  Speech Volume: Normal  Handedness: Right   Mood and Affect  Mood: Irritable  Affect: Congruent   Thought Process  Thought Processes: Coherent  Descriptions of Associations:Intact  Orientation:Full (Time, Place and Person)  Thought Content:WDL  History of Schizophrenia/Schizoaffective disorder:No data recorded Duration of Psychotic Symptoms:No data recorded Hallucinations:Hallucinations: None  Ideas of Reference:None  Suicidal Thoughts:Suicidal Thoughts: No  Homicidal Thoughts:Homicidal Thoughts: No   Sensorium  Memory: Immediate Fair; Recent Fair  Judgment: Fair  Insight: Fair   Art therapist  Concentration: Good  Attention Span: Good  Recall: Good  Fund of Knowledge: Good  Language: Good   Psychomotor Activity  Psychomotor Activity: Psychomotor Activity: Normal   Assets  Assets: Desire for Improvement; Physical Health; Resilience   Sleep  Sleep: Sleep: Good    Physical Exam: Physical Exam Neurological:     Mental Status: She is alert and oriented to person, place, and time.  Psychiatric:        Attention and Perception: Attention normal.        Mood and Affect: Mood normal.        Speech: Speech normal.        Behavior: Behavior is cooperative.        Thought Content: Thought content normal.    Review of Systems  Psychiatric/Behavioral:         IDD  All other systems reviewed and are negative.  Blood pressure 100/61, pulse 100, temperature 98.9 F (37.2 C), temperature source Oral, resp. rate 18, height 6' (1.829 m), weight (!) 152.9 kg, SpO2 100%. Body mass index is 45.71 kg/m.   Demographic Factors:  NA  Loss Factors: NA  Historical  Factors: Impulsivity  Risk Reduction Factors:   Sense of responsibility to family, Religious beliefs about death, Positive social support, and  Positive therapeutic relationship  Continued Clinical Symptoms:  More than one psychiatric diagnosis  Cognitive Features That Contribute To Risk:  Polarized thinking    Suicide Risk:  Mild:  Suicidal ideation of limited frequency, intensity, duration, and specificity.  There are no identifiable plans, no associated intent, mild dysphoria and related symptoms, good self-control (both objective and subjective assessment), few other risk factors, and identifiable protective factors, including available and accessible social support.    Plan Of Care/Follow-up recommendations:  Other:  Continue to follow up with outpatient providers  Medical Decision Making: Pt case reviewed and discussed with Dr. Lucianne Muss. Pt does not meet criteria for IVC or inpatient psychiatric treatment at this time. Pt is psych cleared to discharge home.   - Pt denies needing medication refill - Resources provided in AVS - TOC order placed to help assist with discharge  Disposition: psych cleared for discharge  Eligha Bridegroom, NP 06/17/2023, 12:33 PM

## 2023-06-17 NOTE — ED Provider Notes (Signed)
Emergency Medicine Observation Re-evaluation Note  Cindy Phillips is a 32 y.o. female, seen on rounds today.  Pt initially presented to the ED for complaints of Aggressive Behavior Currently, the patient is asleep.  Physical Exam  BP 100/61 (BP Location: Left Arm)   Pulse 100   Temp 98.9 F (37.2 C) (Oral)   Resp 18   Ht 6' (1.829 m)   Wt (!) 152.9 kg   SpO2 100%   BMI 45.71 kg/m  Physical Exam General: asleep Cardiac: asleep Lungs: asleep Psych: asleep  ED Course / MDM  EKG:EKG Interpretation Date/Time:  Friday June 14 2023 14:23:03 EDT Ventricular Rate:  107 PR Interval:  132 QRS Duration:  80 QT Interval:  298 QTC Calculation: 397 R Axis:   75  Text Interpretation: Sinus tachycardia with occasional Premature ventricular complexes Nonspecific T wave abnormality Abnormal ECG When compared with ECG of 10-May-2023 19:53, PREVIOUS ECG IS PRESENT Confirmed by Margarita Grizzle [phone]) on 06/14/2023 2:34:08 PM  I have reviewed the labs performed to date as well as medications administered while in observation.  No recent changes in the last 24 hours.  Plan  Current plan is for inpatient psychiatric admission.    [name], MD 06/17/23 [phone]

## 2023-06-17 NOTE — ED Notes (Signed)
Pt received for care at 1900.  At this time, pt quietly resting in bed; respirations even and unlabored with no distress noted.  Bed in lowest position, wheels locked.  Pt remains in view of sitter with necessary precautions maintained.

## 2023-07-01 DIAGNOSIS — Z5181 Encounter for therapeutic drug level monitoring: Secondary | ICD-10-CM | POA: Diagnosis not present

## 2023-07-09 DIAGNOSIS — R600 Localized edema: Secondary | ICD-10-CM | POA: Diagnosis not present

## 2023-07-09 DIAGNOSIS — E1169 Type 2 diabetes mellitus with other specified complication: Secondary | ICD-10-CM | POA: Diagnosis not present

## 2023-07-29 DIAGNOSIS — Z5181 Encounter for therapeutic drug level monitoring: Secondary | ICD-10-CM | POA: Diagnosis not present

## 2023-08-04 ENCOUNTER — Ambulatory Visit (HOSPITAL_COMMUNITY)
Admission: EM | Admit: 2023-08-04 | Discharge: 2023-08-04 | Disposition: A | Discharge status: Home / Self Care | Payer: 59 | Attending: Family Medicine | Admitting: Family Medicine

## 2023-08-04 ENCOUNTER — Encounter (HOSPITAL_COMMUNITY): Payer: Self-pay | Admitting: Emergency Medicine

## 2023-08-04 ENCOUNTER — Other Ambulatory Visit: Payer: Self-pay

## 2023-08-04 DIAGNOSIS — R350 Frequency of micturition: Secondary | ICD-10-CM

## 2023-08-04 LAB — POCT URINALYSIS DIP (MANUAL ENTRY)
Bilirubin, UA: NEGATIVE
Blood, UA: NEGATIVE
Glucose, UA: NEGATIVE mg/dL
Ketones, POC UA: NEGATIVE mg/dL
Nitrite, UA: NEGATIVE
Protein Ur, POC: NEGATIVE mg/dL
Spec Grav, UA: 1.01 (ref 1.010–1.025)
Urobilinogen, UA: 0.2 U/dL
pH, UA: 7 (ref 5.0–8.0)

## 2023-08-04 LAB — POCT URINE PREGNANCY: Preg Test, Ur: NEGATIVE

## 2023-08-04 MED ORDER — NITROFURANTOIN MONOHYD MACRO 100 MG PO CAPS: 100.0000 mg | ORAL_CAPSULE | Freq: Two times a day (BID) | ORAL | 0 refills | Status: DC

## 2023-08-04 NOTE — ED Triage Notes (Signed)
Symptoms started 2 days ago.  Complains of odor, urinating more frequently, denies pain.    Patient has not used any medications.

## 2023-08-04 NOTE — Discharge Instructions (Addendum)
There was a little bit of white blood cells on your urinalysis, and this could be a sign of a urine infection.  Take nitrofurantoin 100 mg--1 capsule 2 times daily for 5 days  Urine culture is sent, and staff will notify if it looks like your antibiotic needs to be changed or stopped

## 2023-08-04 NOTE — ED Provider Notes (Addendum)
[facility]    CSN: [id number] Arrival date & time: 08/04/23  1049      History   Chief Complaint Chief Complaint  Patient presents with   Urinary Tract Infection    HPI Cindy Phillips is a 32 y.o. female.    Urinary Tract Infection Here for urinary frequency and strong smelling urine.  The patient deems that this is a UTI.  There is no dysuria and no fever or vomiting.  She is not allergic to any medicine  She does not have any vaginal discharge or abdominal pain.  Past Medical History:  Diagnosis Date   Arthritis    Bipolar 1 disorder (HCC)    Bowel incontinence    Depression    Diabetes mellitus without complication (HCC)    Headache    migraines   Mental disability    Sleep apnea     Patient Active Problem List   Diagnosis Date Noted   Pain in left elbow 07/27/2020   Prediabetes 02/03/2019   Obstructive sleep apnea 04/23/2018   Other fatigue 11/26/2017   Shortness of breath on exertion 11/26/2017   Type 2 diabetes mellitus without complication, without long-term current use of insulin (HCC) 11/26/2017   Other hyperlipidemia 11/26/2017   Vitamin D deficiency 11/26/2017   Schizoaffective disorder, bipolar type (HCC) 09/14/2014   Intellectual disability 09/10/2014    Past Surgical History:  Procedure Laterality Date   ANTERIOR CRUCIATE LIGAMENT REPAIR Right 05/11/2022   Procedure: RIGHT KNEE ARTHROSCOPY; LATERAL MENISECTOMY; ANTERIOR CRUCIATE LIGAMENT (ACL) REPAIR; CHONDROPLASTY;  Surgeon: Frederico Hamman, MD;  Location: [facility];  Service: Orthopedics;  Laterality: Right;   FOOT SURGERY     KNEE ARTHROSCOPY W/ ACL RECONSTRUCTION Left 10/12/2020   TOOTH EXTRACTION      OB History     Gravida  0   Para  0   Term  0   Preterm  0   AB  0   Living  0      SAB  0   IAB  0   Ectopic  0   Multiple  0   Live Births  0            Home Medications    Prior to Admission medications   Medication Sig Start Date End Date  Taking? Authorizing Provider  nitrofurantoin, macrocrystal-monohydrate, (MACROBID) 100 MG capsule Take 1 capsule (100 mg total) by mouth 2 (two) times daily. 08/04/23  Yes Zenia Resides, MD  atorvastatin (LIPITOR) 40 MG tablet Take 40 mg by mouth every evening.    [provider]  baclofen (LIORESAL) 10 MG tablet Take 10 mg by mouth. 05/06/23   [provider]  celecoxib (CELEBREX) 200 MG capsule Take 200 mg by mouth 2 (two) times daily. 05/21/23   [provider]  diclofenac Sodium (VOLTAREN) 1 % GEL  04/03/23   [provider]  gabapentin (NEURONTIN) 300 MG capsule Take 300 mg by mouth every evening. 04/17/22   [provider]  hydrOXYzine (ATARAX) 50 MG tablet Take 1 tablet (50 mg total) by mouth 2 (two) times daily as needed for anxiety. 04/09/23   Lauro Franklin, MD  ibuprofen (ADVIL) 600 MG tablet Take 600 mg by mouth every 6 (six) hours as needed. 04/17/23   [provider]  lamoTRIgine (LAMICTAL) 150 MG tablet Take 150 mg by mouth daily. 06/11/23   [provider]  LINZESS 72 MCG capsule Take 72 mcg by mouth every morning. 04/02/23  [provider]  medroxyPROGESTERone (DEPO-PROVERA) 150 MG/ML injection Inject 1 mL (150 mg total) into the muscle every 3 (three) months. For prevention of pregnancy 09/14/14   Armandina Stammer I, NP  metFORMIN (GLUCOPHAGE) 500 MG tablet Take one tablet by mouth in the morning and one tablet by mouth in the evening Patient taking differently: Take 500 mg by mouth daily with breakfast. 03/21/20   Quillian Quince D, MD  naproxen (NAPROSYN) 250 MG tablet Take by mouth. 05/17/23   [provider]  OLANZapine (ZYPREXA) 20 MG tablet Take 20 mg by mouth at bedtime. 06/04/23   [provider]  OLANZapine zydis (ZYPREXA) 10 MG disintegrating tablet Take 1 tablet (10 mg total) by mouth at bedtime. Patient taking differently: Take 15 mg by mouth at bedtime. 04/09/23   Lauro Franklin, MD    Family History Family History  Problem Relation Age of Onset   High blood pressure Mother    Anxiety disorder Mother    Diabetes Maternal Grandmother     Social History Social History   Tobacco Use   Smoking status: Never   Smokeless tobacco: Never  Vaping Use   Vaping status: Never Used  Substance Use Topics   Alcohol use: Not Currently   Drug use: No     Allergies   Patient has no known allergies.   Review of Systems Review of Systems   Physical Exam Triage Vital Signs ED Triage Vitals  Encounter Vitals Group     BP 08/04/23 1149 (!) 151/88     Systolic BP Percentile --      Diastolic BP Percentile --      Pulse Rate 08/04/23 1149 (!) 108     Resp 08/04/23 1149 20     Temp 08/04/23 1149 98.1 F (36.7 C)     Temp Source 08/04/23 1149 Oral     SpO2 08/04/23 1149 96 %     Weight --      Height --      Head Circumference --      Peak Flow --      Pain Score 08/04/23 1146 0     Pain Loc --      Pain Education --      Exclude from Growth Chart --    No data found.  Updated Vital Signs BP (!) 151/88 (BP Location: Right Arm)   Pulse (!) 108   Temp 98.1 F (36.7 C) (Oral)   Resp 20   SpO2 96%   Visual Acuity Right Eye Distance:   Left Eye Distance:   Bilateral Distance:    Right Eye Near:   Left Eye Near:    Bilateral Near:     Physical Exam Vitals reviewed.  Constitutional:      General: She is not in acute distress.    Appearance: She is not ill-appearing, toxic-appearing or diaphoretic.  HENT:     Mouth/Throat:     Mouth: Mucous membranes are moist.  Eyes:     Extraocular Movements: Extraocular movements intact.     Pupils: Pupils are equal, round, and reactive to light.  Cardiovascular:     Rate and Rhythm: Normal rate and regular rhythm.     Heart sounds: No murmur heard. Pulmonary:     Effort: Pulmonary effort is normal.     Breath sounds: Normal breath sounds.  Abdominal:     Palpations: Abdomen is soft.      Tenderness: There is no abdominal tenderness.  Musculoskeletal:  Cervical back: Neck supple.  Lymphadenopathy:     Cervical: No cervical adenopathy.  Skin:    Coloration: Skin is not pale.  Neurological:     General: No focal deficit present.     Mental Status: She is alert and oriented to person, place, and time.  Psychiatric:        Behavior: Behavior normal.      UC Treatments / Results  Labs (all labs ordered are listed, but only abnormal results are displayed) Labs Reviewed  POCT URINALYSIS DIP (MANUAL ENTRY) - Abnormal; Notable for the following components:      Result Value   Clarity, UA cloudy (*)    Leukocytes, UA Trace (*)    All other components within normal limits  URINE CULTURE  POCT URINE PREGNANCY    EKG   Radiology No results found.  Procedures Procedures (including critical care time)  Medications Ordered in UC Medications - No data to display  Initial Impression / Assessment and Plan / UC Course  I have reviewed the triage vital signs and the nursing notes.  Pertinent labs & imaging results that were available during my care of the patient were reviewed by me and considered in my medical decision making (see chart for details).     There is trace of leukocytes in her urine.  Urine culture is sent.  Macrobid is sent to treat the possible UTI.  Staff will notify her if it looks like antibiotic needs to be changed or if the urine culture is negative Final Clinical Impressions(s) / UC Diagnoses   Final diagnoses:  Urinary frequency     Discharge Instructions      There was a little bit of white blood cells on your urinalysis, and this could be a sign of a urine infection.  Take nitrofurantoin 100 mg--1 capsule 2 times daily for 5 days  Urine culture is sent, and staff will notify if it looks like your antibiotic needs to be changed or stopped     ED Prescriptions     Medication Sig Dispense Auth. Provider   nitrofurantoin,  macrocrystal-monohydrate, (MACROBID) 100 MG capsule Take 1 capsule (100 mg total) by mouth 2 (two) times daily. 10 capsule Zenia Resides, MD      PDMP not reviewed this encounter.   Zenia Resides, MD 08/04/23 1234    Zenia Resides, MD 08/04/23 [phone]

## 2023-08-05 LAB — URINE CULTURE

## 2023-08-11 ENCOUNTER — Ambulatory Visit (HOSPITAL_COMMUNITY)
Admission: EM | Admit: 2023-08-11 | Discharge: 2023-08-11 | Disposition: A | Discharge status: Home / Self Care | Payer: 59 | Attending: Nurse Practitioner | Admitting: Nurse Practitioner

## 2023-08-11 ENCOUNTER — Encounter (HOSPITAL_COMMUNITY): Payer: Self-pay | Admitting: Emergency Medicine

## 2023-08-11 DIAGNOSIS — R350 Frequency of micturition: Secondary | ICD-10-CM | POA: Insufficient documentation

## 2023-08-11 LAB — POCT URINALYSIS DIP (MANUAL ENTRY)
Bilirubin, UA: NEGATIVE
Glucose, UA: NEGATIVE mg/dL
Nitrite, UA: NEGATIVE
Spec Grav, UA: 1.02 (ref 1.010–1.025)
Urobilinogen, UA: 2 U/dL — AB
pH, UA: 7 (ref 5.0–8.0)

## 2023-08-11 MED ORDER — PHENAZOPYRIDINE HCL 100 MG PO TABS: 100.0000 mg | ORAL_TABLET | Freq: Three times a day (TID) | ORAL | 0 refills | Status: DC | PRN

## 2023-08-11 NOTE — Discharge Instructions (Addendum)
The urine sample today does not show a clear UTI.  We are sending for another urine culture and will contact you if we need to start antibiotic therapy.  In the meantime, start taking the Pyridium which should help with the urinary frequency.  In addition, recommend drinking water and avoiding any beverages with caffeine to help with decreased the urinary frequency.  Follow-up with your primary care provider if symptoms do not improve with treatment.

## 2023-08-11 NOTE — ED Provider Notes (Signed)
[facility]    CSN: [id number] Arrival date & time: 08/11/23  1136      History   Chief Complaint Chief Complaint  Patient presents with   Urinary Frequency    HPI Cindy Phillips is a 32 y.o. female.   Patient presents today for ongoing urinary frequency and foul urinary odor that has been ongoing since her last visit here 1 week ago.  She reports 1 episode of dysuria, but none since, no urgency, voiding smaller amounts, hematuria, abdominal pain, flank pain, fever, nausea/vomiting, or abnormal vaginal discharge.  She is also having some suprapubic pressure.    Starting 08/04/23, she took the [facility] as previously prescribed without improvement in symptoms.  Urine culture came back showing multiple species and suggested recollection.    Past Medical History:  Diagnosis Date   Arthritis    Bipolar 1 disorder (HCC)    Bowel incontinence    Depression    Diabetes mellitus without complication (HCC)    Headache    migraines   Mental disability    Sleep apnea     Patient Active Problem List   Diagnosis Date Noted   Pain in left elbow 07/27/2020   Prediabetes 02/03/2019   Obstructive sleep apnea 04/23/2018   Other fatigue 11/26/2017   Shortness of breath on exertion 11/26/2017   Type 2 diabetes mellitus without complication, without long-term current use of insulin (HCC) 11/26/2017   Other hyperlipidemia 11/26/2017   Vitamin D deficiency 11/26/2017   Schizoaffective disorder, bipolar type (HCC) 09/14/2014   Intellectual disability 09/10/2014    Past Surgical History:  Procedure Laterality Date   ANTERIOR CRUCIATE LIGAMENT REPAIR Right 05/11/2022   Procedure: RIGHT KNEE ARTHROSCOPY; LATERAL MENISECTOMY; ANTERIOR CRUCIATE LIGAMENT (ACL) REPAIR; CHONDROPLASTY;  Surgeon: Frederico Hamman, MD;  Location: [facility];  Service: Orthopedics;  Laterality: Right;   FOOT SURGERY     KNEE ARTHROSCOPY W/ ACL RECONSTRUCTION Left 10/12/2020   TOOTH EXTRACTION      OB  History     Gravida  0   Para  0   Term  0   Preterm  0   AB  0   Living  0      SAB  0   IAB  0   Ectopic  0   Multiple  0   Live Births  0            Home Medications    Prior to Admission medications   Medication Sig Start Date End Date Taking? Authorizing Provider  phenazopyridine (PYRIDIUM) 100 MG tablet Take 1 tablet (100 mg total) by mouth 3 (three) times daily as needed for pain (urinary frequency). 08/11/23  Yes Cathlean Marseilles A, NP  atorvastatin (LIPITOR) 40 MG tablet Take 40 mg by mouth every evening.    [provider]  baclofen (LIORESAL) 10 MG tablet Take 10 mg by mouth. 05/06/23   [provider]  celecoxib (CELEBREX) 200 MG capsule Take 200 mg by mouth 2 (two) times daily. 05/21/23   [provider]  diclofenac Sodium (VOLTAREN) 1 % GEL  04/03/23   [provider]  gabapentin (NEURONTIN) 300 MG capsule Take 300 mg by mouth every evening. 04/17/22   [provider]  hydrOXYzine (ATARAX) 50 MG tablet Take 1 tablet (50 mg total) by mouth 2 (two) times daily as needed for anxiety. 04/09/23   Lauro Franklin, MD  ibuprofen (ADVIL) 600 MG tablet Take 600 mg by mouth every 6 (six) hours  as needed. 04/17/23   [provider]  lamoTRIgine (LAMICTAL) 150 MG tablet Take 150 mg by mouth daily. 06/11/23   [provider]  LINZESS 72 MCG capsule Take 72 mcg by mouth every morning. 04/02/23   [provider]  medroxyPROGESTERone (DEPO-PROVERA) 150 MG/ML injection Inject 1 mL (150 mg total) into the muscle every 3 (three) months. For prevention of pregnancy 09/14/14   Armandina Stammer I, NP  metFORMIN (GLUCOPHAGE) 500 MG tablet Take one tablet by mouth in the morning and one tablet by mouth in the evening Patient taking differently: Take 500 mg by mouth daily with breakfast. 03/21/20   Quillian Quince D, MD  naproxen (NAPROSYN) 250 MG tablet Take by mouth. 05/17/23   [provider]   nitrofurantoin, macrocrystal-monohydrate, (MACROBID) 100 MG capsule Take 1 capsule (100 mg total) by mouth 2 (two) times daily. 08/04/23   Zenia Resides, MD  OLANZapine (ZYPREXA) 20 MG tablet Take 20 mg by mouth at bedtime. 06/04/23   [provider]  OLANZapine zydis (ZYPREXA) 10 MG disintegrating tablet Take 1 tablet (10 mg total) by mouth at bedtime. Patient taking differently: Take 15 mg by mouth at bedtime. 04/09/23   Lauro Franklin, MD    Family History Family History  Problem Relation Age of Onset   High blood pressure Mother    Anxiety disorder Mother    Diabetes Maternal Grandmother     Social History Social History   Tobacco Use   Smoking status: Never   Smokeless tobacco: Never  Vaping Use   Vaping status: Never Used  Substance Use Topics   Alcohol use: Not Currently   Drug use: No     Allergies   Patient has no known allergies.   Review of Systems Review of Systems Per HPI  Physical Exam Triage Vital Signs ED Triage Vitals  Encounter Vitals Group     BP 08/11/23 1238 (!) 171/99     Systolic BP Percentile --      Diastolic BP Percentile --      Pulse Rate 08/11/23 1238 100     Resp 08/11/23 1238 18     Temp 08/11/23 1238 98.3 F (36.8 C)     Temp Source 08/11/23 1238 Oral     SpO2 08/11/23 1238 98 %     Weight --      Height --      Head Circumference --      Peak Flow --      Pain Score 08/11/23 1237 0     Pain Loc --      Pain Education --      Exclude from Growth Chart --    No data found.  Updated Vital Signs BP (!) 171/99 (BP Location: Left Arm)   Pulse 100   Temp 98.3 F (36.8 C) (Oral)   Resp 18   SpO2 98%   Visual Acuity Right Eye Distance:   Left Eye Distance:   Bilateral Distance:    Right Eye Near:   Left Eye Near:    Bilateral Near:     Physical Exam Vitals and nursing note reviewed.  Constitutional:      General: She is not in acute distress.    Appearance: She is not toxic-appearing.   Pulmonary:     Effort: Pulmonary effort is normal. No respiratory distress.  Abdominal:     General: Abdomen is flat. Bowel sounds are normal. There is no distension.     Palpations: Abdomen  is soft. There is no mass.     Tenderness: There is no abdominal tenderness. There is no right CVA tenderness, left CVA tenderness or guarding.  Skin:    General: Skin is warm and dry.     Coloration: Skin is not jaundiced or pale.     Findings: No erythema.  Neurological:     Mental Status: She is alert and oriented to person, place, and time.     Motor: No weakness.     Gait: Gait normal.  Psychiatric:        Behavior: Behavior is cooperative.      UC Treatments / Results  Labs (all labs ordered are listed, but only abnormal results are displayed) Labs Reviewed  POCT URINALYSIS DIP (MANUAL ENTRY) - Abnormal; Notable for the following components:      Result Value   Clarity, UA cloudy (*)    Ketones, POC UA trace (5) (*)    Blood, UA small (*)    Protein Ur, POC trace (*)    Urobilinogen, UA 2.0 (*)    Leukocytes, UA Small (1+) (*)    All other components within normal limits  URINE CULTURE    EKG   Radiology No results found.  Procedures Procedures (including critical care time)  Medications Ordered in UC Medications - No data to display  Initial Impression / Assessment and Plan / UC Course  I have reviewed the triage vital signs and the nursing notes.  Pertinent labs & imaging results that were available during my care of the patient were reviewed by me and considered in my medical decision making (see chart for details).   Patient is well-appearing, normotensive, afebrile, not tachycardic, not tachypneic, oxygenating well on room air.  Patient is hypertensive in triage, otherwise vital signs are stable.  1. Urinary frequency Overall, vitals and exam are reassuring Urinalysis is cloudy with small blood, trace leukocyte esterase Urine culture pending Treat with Azo  while waiting urine culture results, recommended avoidance of caffeine, pushing hydration with plenty of water Strict ER precautions discussed with patient  The patient was given the opportunity to ask questions.  All questions answered to their satisfaction.  The patient is in agreement to this plan.    Final Clinical Impressions(s) / UC Diagnoses   Final diagnoses:  Urinary frequency     Discharge Instructions      The urine sample today does not show a clear UTI.  We are sending for another urine culture and will contact you if we need to start antibiotic therapy.  In the meantime, start taking the Pyridium which should help with the urinary frequency.  In addition, recommend drinking water and avoiding any beverages with caffeine to help with decreased the urinary frequency.  Follow-up with your primary care provider if symptoms do not improve with treatment.    ED Prescriptions     Medication Sig Dispense Auth. Provider   phenazopyridine (PYRIDIUM) 100 MG tablet Take 1 tablet (100 mg total) by mouth 3 (three) times daily as needed for pain (urinary frequency). 10 tablet Valentino Nose, NP      PDMP not reviewed this encounter.   Valentino Nose, NP 08/11/23 1343

## 2023-08-11 NOTE — ED Triage Notes (Signed)
Pt reports was seen here last Sunday for UTi and finished medication. Reports has been urinating a lot and has an odor.

## 2023-08-12 LAB — URINE CULTURE

## 2023-08-27 DIAGNOSIS — B356 Tinea cruris: Secondary | ICD-10-CM | POA: Diagnosis not present

## 2023-08-27 DIAGNOSIS — M791 Myalgia, unspecified site: Secondary | ICD-10-CM | POA: Diagnosis not present

## 2023-08-27 DIAGNOSIS — G43719 Chronic migraine without aura, intractable, without status migrainosus: Secondary | ICD-10-CM | POA: Diagnosis not present

## 2023-08-27 DIAGNOSIS — M542 Cervicalgia: Secondary | ICD-10-CM | POA: Diagnosis not present

## 2023-09-30 DIAGNOSIS — Z5181 Encounter for therapeutic drug level monitoring: Secondary | ICD-10-CM | POA: Diagnosis not present

## 2023-10-08 DIAGNOSIS — E1169 Type 2 diabetes mellitus with other specified complication: Secondary | ICD-10-CM | POA: Diagnosis not present

## 2023-10-08 DIAGNOSIS — B356 Tinea cruris: Secondary | ICD-10-CM | POA: Diagnosis not present

## 2023-10-15 DIAGNOSIS — M542 Cervicalgia: Secondary | ICD-10-CM | POA: Diagnosis not present

## 2023-10-15 DIAGNOSIS — G43719 Chronic migraine without aura, intractable, without status migrainosus: Secondary | ICD-10-CM | POA: Diagnosis not present

## 2023-10-15 DIAGNOSIS — M791 Myalgia, unspecified site: Secondary | ICD-10-CM | POA: Diagnosis not present

## 2023-10-21 DIAGNOSIS — H53003 Unspecified amblyopia, bilateral: Secondary | ICD-10-CM | POA: Diagnosis not present

## 2023-10-21 DIAGNOSIS — H5213 Myopia, bilateral: Secondary | ICD-10-CM | POA: Diagnosis not present

## 2023-10-21 DIAGNOSIS — H0288B Meibomian gland dysfunction left eye, upper and lower eyelids: Secondary | ICD-10-CM | POA: Diagnosis not present

## 2023-10-21 DIAGNOSIS — E119 Type 2 diabetes mellitus without complications: Secondary | ICD-10-CM | POA: Diagnosis not present

## 2023-10-21 DIAGNOSIS — H33302 Unspecified retinal break, left eye: Secondary | ICD-10-CM | POA: Diagnosis not present

## 2023-10-21 DIAGNOSIS — H04123 Dry eye syndrome of bilateral lacrimal glands: Secondary | ICD-10-CM | POA: Diagnosis not present

## 2023-10-22 ENCOUNTER — Encounter (INDEPENDENT_AMBULATORY_CARE_PROVIDER_SITE_OTHER): Admitting: Ophthalmology

## 2023-10-22 DIAGNOSIS — H43813 Vitreous degeneration, bilateral: Secondary | ICD-10-CM | POA: Diagnosis not present

## 2023-10-22 DIAGNOSIS — H35412 Lattice degeneration of retina, left eye: Secondary | ICD-10-CM | POA: Diagnosis not present

## 2023-11-12 DIAGNOSIS — E1169 Type 2 diabetes mellitus with other specified complication: Secondary | ICD-10-CM | POA: Diagnosis not present

## 2023-11-12 DIAGNOSIS — N390 Urinary tract infection, site not specified: Secondary | ICD-10-CM | POA: Diagnosis not present

## 2023-11-19 DIAGNOSIS — N390 Urinary tract infection, site not specified: Secondary | ICD-10-CM | POA: Diagnosis not present

## 2023-11-20 ENCOUNTER — Ambulatory Visit
Admission: RE | Admit: 2023-11-20 | Discharge: 2023-11-20 | Disposition: A | Discharge status: Home / Self Care | Source: Ambulatory Visit | Attending: Family Medicine | Admitting: Family Medicine

## 2023-11-20 ENCOUNTER — Other Ambulatory Visit: Payer: Self-pay | Admitting: Family Medicine

## 2023-11-20 DIAGNOSIS — N390 Urinary tract infection, site not specified: Secondary | ICD-10-CM

## 2023-11-20 DIAGNOSIS — R1030 Lower abdominal pain, unspecified: Secondary | ICD-10-CM | POA: Diagnosis not present

## 2023-12-02 DIAGNOSIS — Z5181 Encounter for therapeutic drug level monitoring: Secondary | ICD-10-CM | POA: Diagnosis not present

## 2023-12-05 DIAGNOSIS — G43719 Chronic migraine without aura, intractable, without status migrainosus: Secondary | ICD-10-CM | POA: Diagnosis not present

## 2023-12-05 DIAGNOSIS — M791 Myalgia, unspecified site: Secondary | ICD-10-CM | POA: Diagnosis not present

## 2023-12-05 DIAGNOSIS — M542 Cervicalgia: Secondary | ICD-10-CM | POA: Diagnosis not present

## 2023-12-12 DIAGNOSIS — M25562 Pain in left knee: Secondary | ICD-10-CM | POA: Diagnosis not present

## 2023-12-12 DIAGNOSIS — M25561 Pain in right knee: Secondary | ICD-10-CM | POA: Diagnosis not present

## 2023-12-17 DIAGNOSIS — L290 Pruritus ani: Secondary | ICD-10-CM | POA: Diagnosis not present

## 2024-01-02 DIAGNOSIS — M25562 Pain in left knee: Secondary | ICD-10-CM | POA: Diagnosis not present

## 2024-01-03 ENCOUNTER — Other Ambulatory Visit: Payer: Self-pay | Admitting: Physician Assistant

## 2024-01-03 ENCOUNTER — Ambulatory Visit (HOSPITAL_COMMUNITY)
Admission: RE | Admit: 2024-01-03 | Discharge: 2024-01-03 | Disposition: A | Discharge status: Home / Self Care | Source: Ambulatory Visit | Attending: Vascular Surgery | Admitting: Vascular Surgery

## 2024-01-03 DIAGNOSIS — M79605 Pain in left leg: Secondary | ICD-10-CM

## 2024-01-09 DIAGNOSIS — M25562 Pain in left knee: Secondary | ICD-10-CM | POA: Diagnosis not present

## 2024-01-16 DIAGNOSIS — M542 Cervicalgia: Secondary | ICD-10-CM | POA: Diagnosis not present

## 2024-01-16 DIAGNOSIS — M791 Myalgia, unspecified site: Secondary | ICD-10-CM | POA: Diagnosis not present

## 2024-01-16 DIAGNOSIS — G43719 Chronic migraine without aura, intractable, without status migrainosus: Secondary | ICD-10-CM | POA: Diagnosis not present

## 2024-01-27 DIAGNOSIS — Z5181 Encounter for therapeutic drug level monitoring: Secondary | ICD-10-CM | POA: Diagnosis not present

## 2024-01-28 DIAGNOSIS — M25562 Pain in left knee: Secondary | ICD-10-CM | POA: Diagnosis not present

## 2024-02-11 DIAGNOSIS — M1712 Unilateral primary osteoarthritis, left knee: Secondary | ICD-10-CM | POA: Diagnosis not present

## 2024-02-11 DIAGNOSIS — M1711 Unilateral primary osteoarthritis, right knee: Secondary | ICD-10-CM | POA: Diagnosis not present

## 2024-02-18 DIAGNOSIS — M1712 Unilateral primary osteoarthritis, left knee: Secondary | ICD-10-CM | POA: Diagnosis not present

## 2024-02-24 DIAGNOSIS — R1032 Left lower quadrant pain: Secondary | ICD-10-CM | POA: Diagnosis not present

## 2024-02-24 DIAGNOSIS — E7800 Pure hypercholesterolemia, unspecified: Secondary | ICD-10-CM | POA: Diagnosis not present

## 2024-02-24 DIAGNOSIS — E1169 Type 2 diabetes mellitus with other specified complication: Secondary | ICD-10-CM | POA: Diagnosis not present

## 2024-02-27 DIAGNOSIS — M542 Cervicalgia: Secondary | ICD-10-CM | POA: Diagnosis not present

## 2024-02-27 DIAGNOSIS — G43719 Chronic migraine without aura, intractable, without status migrainosus: Secondary | ICD-10-CM | POA: Diagnosis not present

## 2024-02-27 DIAGNOSIS — M791 Myalgia, unspecified site: Secondary | ICD-10-CM | POA: Diagnosis not present

## 2024-02-27 DIAGNOSIS — M1712 Unilateral primary osteoarthritis, left knee: Secondary | ICD-10-CM | POA: Diagnosis not present

## 2024-03-02 DIAGNOSIS — Z5181 Encounter for therapeutic drug level monitoring: Secondary | ICD-10-CM | POA: Diagnosis not present

## 2024-03-04 ENCOUNTER — Ambulatory Visit: Attending: Orthopedic Surgery

## 2024-03-04 DIAGNOSIS — M25562 Pain in left knee: Secondary | ICD-10-CM | POA: Diagnosis not present

## 2024-03-04 DIAGNOSIS — M6281 Muscle weakness (generalized): Secondary | ICD-10-CM | POA: Diagnosis not present

## 2024-03-04 DIAGNOSIS — R2689 Other abnormalities of gait and mobility: Secondary | ICD-10-CM | POA: Diagnosis not present

## 2024-03-04 DIAGNOSIS — M25561 Pain in right knee: Secondary | ICD-10-CM | POA: Diagnosis not present

## 2024-03-04 DIAGNOSIS — G8929 Other chronic pain: Secondary | ICD-10-CM | POA: Diagnosis not present

## 2024-03-04 NOTE — Therapy (Signed)
 OUTPATIENT PHYSICAL THERAPY LOWER EXTREMITY EVALUATION   Patient Name: Cindy Phillips MRN: [id number] DOB:02/02/1991, 33 y.o., female Today's Date: 03/05/2024  END OF SESSION:  PT End of Session - 03/04/24 1539     Visit Number 1    Number of Visits 17    Date for PT Re-Evaluation 04/29/24    Authorization Type [employer] Dual Complete    PT Start Time 1320    PT Stop Time 1400    PT Time Calculation (min) 40 min    Activity Tolerance Patient tolerated treatment well    Behavior During Therapy WFL for tasks assessed/performed          Past Medical History:  Diagnosis Date   Arthritis    Bipolar 1 disorder (HCC)    Bowel incontinence    Depression    Diabetes mellitus without complication (HCC)    Headache    migraines   Mental disability    Sleep apnea    Past Surgical History:  Procedure Laterality Date   ANTERIOR CRUCIATE LIGAMENT REPAIR Right 05/11/2022   Procedure: RIGHT KNEE ARTHROSCOPY; LATERAL MENISECTOMY; ANTERIOR CRUCIATE LIGAMENT (ACL) REPAIR; CHONDROPLASTY;  Surgeon: Shari Sieving, MD;  Location: [facility];  Service: Orthopedics;  Laterality: Right;   FOOT SURGERY     KNEE ARTHROSCOPY W/ ACL RECONSTRUCTION Left 10/12/2020   TOOTH EXTRACTION     Patient Active Problem List   Diagnosis Date Noted   Pain in left elbow 07/27/2020   Prediabetes 02/03/2019   Obstructive sleep apnea 04/23/2018   Other fatigue 11/26/2017   Shortness of breath on exertion 11/26/2017   Type 2 diabetes mellitus without complication, without long-term current use of insulin  (HCC) 11/26/2017   Other hyperlipidemia 11/26/2017   Vitamin D  deficiency 11/26/2017   Schizoaffective disorder, bipolar type (HCC) 09/14/2014   Intellectual disability 09/10/2014    PCP: Leigh Lung, MD  REFERRING PROVIDER: Shari Sieving, MD  REFERRING DIAG: Hx of ACL Reconstruction   THERAPY DIAG:  Chronic pain of left knee - Plan: PT plan of care cert/re-cert  Chronic pain of right knee - Plan: PT  plan of care cert/re-cert  Muscle weakness (generalized) - Plan: PT plan of care cert/re-cert  Other abnormalities of gait and mobility - Plan: PT plan of care cert/re-cert  Rationale for Evaluation and Treatment: Rehabilitation  ONSET DATE: Chronic  SUBJECTIVE:   SUBJECTIVE STATEMENT: Pt presents to PT with reports of chronic bilateral knee pain in presence of previous ACL repairs. She is well known to therapist and has been seen for both ACL recoveries in past. She currently has significant pain when standing for long periods of time, or when walking/squatting. Denies referral of symptoms past knee, pain is sharp and along bilateral knee joint lines.   PERTINENT HISTORY: Bipolar disorder, Depression, intellectual disability, bilateral ACL reconstruction   PAIN:  Are you having pain?  Yes: NPRS scale: 7/10 Worst: 10/10 Pain location: bilateral inferior knee Pain description: sharp, sore Aggravating factors: standing, stairs,  Relieving factors: rest  PRECAUTIONS: None  RED FLAGS: None   WEIGHT BEARING RESTRICTIONS: No  FALLS:  Has patient fallen in last 6 months? -   LIVING ENVIRONMENT: Lives with: lives with their family Lives in: House/apartment Stairs: 4 STE - handrail  Has following equipment at home: None  OCCUPATION: Disability  PLOF: Independent with basic ADLs  PATIENT GOALS: decrease knee pain, improve strength and be able to work out  NEXT MD VISIT: PRN  OBJECTIVE:  Note: Objective measures were completed at  Evaluation unless otherwise noted.  DIAGNOSTIC FINDINGS: N/A  PATIENT SURVEYS:  LEFS: 34/80  COGNITION: Overall cognitive status: Within functional limits for tasks assessed     SENSATION: WFL  POSTURE: rounded shoulders, forward head, increased lumbar lordosis, and large body habitus  PALPATION: TTP to distal quad  LOWER EXTREMITY ROM:  Active ROM Right eval Left eval  Hip flexion    Hip extension    Hip abduction    Hip  adduction    Hip internal rotation    Hip external rotation    Knee flexion 114 118  Knee extension 0 5  Ankle dorsiflexion    Ankle plantarflexion    Ankle inversion    Ankle eversion     (Blank rows = not tested)  LOWER EXTREMITY MMT:  MMT Right eval Left eval  Hip flexion 3+ 3+  Hip extension    Hip abduction 3+ 3+  Hip adduction    Hip internal rotation    Hip external rotation    Knee flexion 3+ 3+  Knee extension 3+ 3+  Ankle dorsiflexion    Ankle plantarflexion    Ankle inversion    Ankle eversion     (Blank rows = not tested)  LOWER EXTREMITY SPECIAL TESTS:  Knee special tests: Lachman Test: negative  FUNCTIONAL TESTS:  30 Second Sit to Stand: 8 reps  GAIT: Distance walked: 3ft Assistive device utilized: None Level of assistance: Complete Independence Comments: decreased gait speed, antalgia L   TREATMENT: [facility] Adult PT Treatment:                                                DATE: 03/04/2024 Therapeutic Exercise: Heel slide x 5 ea Supine QS x 5 - towel under knees Supine SLR x 5 ea S/L hip abd x 5 ea  PATIENT EDUCATION:  Education details: eval findings, LEFS, HEP, POC Person educated: Patient Education method: Explanation, Demonstration, and Handouts Education comprehension: verbalized understanding and returned demonstration  HOME EXERCISE PROGRAM: Access Code: [email or web address] URL: [email or web address] Date: 03/04/2024 Prepared by: Alm Kingdom  Exercises - Supine Heel Slide  - 1 x daily - 7 x weekly - 2 sets - 10 reps - 5 sec hold - Supine Quadricep Sets  - 1 x daily - 7 x weekly - 2 sets - 10 reps - 5 sec hold - Active Straight Leg Raise with Quad Set  - 1 x daily - 7 x weekly - 3-4 sets - 5 reps - Sidelying Hip Abduction  - 1 x daily - 7 x weekly - 3-4 sets - 5 reps  ASSESSMENT:  CLINICAL IMPRESSION: Patient is a 33 y.o. F who was seen today for physical therapy evaluation and treatment for bilateral knee pain and  discomfort. Physical findings are consistent with injury/surgical hx along with bilateral LE weakness particularly in lateral hip and quad. Her LEFS score shows moderate disability in performance of home ADLs and community level activities. Pt would benefit from skilled PT services with specific focus on LE strengthening for improving movement and decreasing knee pain.   OBJECTIVE IMPAIRMENTS: Abnormal gait, decreased activity tolerance, decreased mobility, difficulty walking, decreased ROM, decreased strength, and pain  ACTIVITY LIMITATIONS: carrying, lifting, sitting, standing, squatting, stairs, transfers, and locomotion level  PARTICIPATION LIMITATIONS: meal prep, cleaning, driving, shopping, community activity, and yard work  PERSONAL FACTORS: Time since onset of  injury/illness/exacerbation and 3+ comorbidities: Bipolar disorder, Depression, intellectual disability, bilateral ACL reconstruction  are also affecting patient's functional outcome.   REHAB POTENTIAL: Good  CLINICAL DECISION MAKING: Evolving/moderate complexity  EVALUATION COMPLEXITY: Moderate   GOALS: Goals reviewed with patient? No  SHORT TERM GOALS: Target date: 03/25/2024   Pt will be compliant and knowledgeable with initial HEP for improved comfort and carryover Baseline: initial HEP given  Goal status: INITIAL  2.  Pt will self report bilateral knee pain no greater than 7/10 for improved comfort and functional ability Baseline: 10/10 at worst Goal status: INITIAL   LONG TERM GOALS: Target date: 04/29/2024   Pt will improve LEFS to no less than 50/80 as proxy for functional improvement with home ADLs and higher level community activity Baseline: 34/80 Goal status: INITIAL   2.  Pt will self report bilateral knee pain no greater than 3/10 for improved comfort and functional ability Baseline: 10/10 at worst Goal status: INITIAL   3.  Pt will increase 30 Second Sit to Stand rep count to no less than 12 reps for  improved balance, strength, and functional mobility Baseline: 8 reps  Goal status: INITIAL   4.  Pt will improve all LE MMT to no less than 4/5 for all tested motions for improved knee stabilization and decreased pain Baseline: see MMT chart Goal status: INITIAL  5.  Pt will be able to squat with hands to floor without increase in bilateral knee pain for improved comfort and functional ability  Baseline: unable Goal status: INITIAL    PLAN:  PT FREQUENCY: 2x/week  PT DURATION: 8 weeks  PLANNED INTERVENTIONS: 97164- PT Re-evaluation, 97110-Therapeutic exercises, 97530- Therapeutic activity, 97112- Neuromuscular re-education, 97535- Self Care, [id number]- Manual therapy, [id number]- Gait training, H9716- Electrical stimulation (unattended), [id number]- Electrical stimulation (manual), 97016- Vasopneumatic device, Cryotherapy, and Moist heat  PLAN FOR NEXT SESSION: assess HEP response, quad and hip strengthening, progress functional lifting movements    Alm JAYSON Kingdom, PT 03/05/2024, 12:04 PM

## 2024-03-05 ENCOUNTER — Other Ambulatory Visit: Payer: Self-pay

## 2024-03-11 ENCOUNTER — Ambulatory Visit

## 2024-03-11 DIAGNOSIS — M25561 Pain in right knee: Secondary | ICD-10-CM | POA: Diagnosis not present

## 2024-03-11 DIAGNOSIS — R2689 Other abnormalities of gait and mobility: Secondary | ICD-10-CM

## 2024-03-11 DIAGNOSIS — M6281 Muscle weakness (generalized): Secondary | ICD-10-CM

## 2024-03-11 DIAGNOSIS — M25562 Pain in left knee: Secondary | ICD-10-CM | POA: Diagnosis not present

## 2024-03-11 DIAGNOSIS — G8929 Other chronic pain: Secondary | ICD-10-CM

## 2024-03-11 NOTE — Therapy (Signed)
 OUTPATIENT PHYSICAL THERAPY TREATMENT NOTE   Patient Name: Cindy Phillips MRN: [id number] DOB:[birth year], 33 y.o., female Today's Date: 03/11/2024  END OF SESSION:  PT End of Session - 03/11/24 1341     Visit Number 2    Number of Visits 17    Date for PT Re-Evaluation 04/29/24    Authorization Type [employer] Dual Complete    PT Start Time 1400    PT Stop Time 1438    PT Time Calculation (min) 38 min    Activity Tolerance Patient tolerated treatment well    Behavior During Therapy WFL for tasks assessed/performed          Past Medical History:  Diagnosis Date   Arthritis    Bipolar 1 disorder (HCC)    Bowel incontinence    Depression    Diabetes mellitus without complication (HCC)    Headache    migraines   Mental disability    Sleep apnea    Past Surgical History:  Procedure Laterality Date   ANTERIOR CRUCIATE LIGAMENT REPAIR Right 05/11/2022   Procedure: RIGHT KNEE ARTHROSCOPY; LATERAL MENISECTOMY; ANTERIOR CRUCIATE LIGAMENT (ACL) REPAIR; CHONDROPLASTY;  Surgeon: Shari Sieving, MD;  Location: [facility];  Service: Orthopedics;  Laterality: Right;   FOOT SURGERY     KNEE ARTHROSCOPY W/ ACL RECONSTRUCTION Left 10/12/2020   TOOTH EXTRACTION     Patient Active Problem List   Diagnosis Date Noted   Pain in left elbow 07/27/2020   Prediabetes 02/03/2019   Obstructive sleep apnea 04/23/2018   Other fatigue 11/26/2017   Shortness of breath on exertion 11/26/2017   Type 2 diabetes mellitus without complication, without long-term current use of insulin  (HCC) 11/26/2017   Other hyperlipidemia 11/26/2017   Vitamin D  deficiency 11/26/2017   Schizoaffective disorder, bipolar type (HCC) 09/14/2014   Intellectual disability 09/10/2014    PCP: Leigh Lung, MD  REFERRING PROVIDER: Shari Sieving, MD  REFERRING DIAG: Hx of ACL Reconstruction   THERAPY DIAG:  Chronic pain of left knee  Chronic pain of right knee  Muscle weakness (generalized)  Other abnormalities of  gait and mobility  Rationale for Evaluation and Treatment: Rehabilitation  ONSET DATE: Chronic  SUBJECTIVE:   SUBJECTIVE STATEMENT: Patient reports that she does have any pain at rest but that she has pain with walking greater than 20 minutes. She states that she had an injection in the Lt knee about a month ago which has helped her pain. She states she has been falling frequently lately and that she feels like her knees just buckle or that she is trying to move too quickly.  EVAL: Pt presents to PT with reports of chronic bilateral knee pain in presence of previous ACL repairs. She is well known to therapist and has been seen for both ACL recoveries in past. She currently has significant pain when standing for long periods of time, or when walking/squatting. Denies referral of symptoms past knee, pain is sharp and along bilateral knee joint lines.   PERTINENT HISTORY: Bipolar disorder, Depression, intellectual disability, bilateral ACL reconstruction   PAIN:  Are you having pain?  Yes: NPRS scale: 7/10 Worst: 10/10 Pain location: bilateral inferior knee Pain description: sharp, sore Aggravating factors: standing, stairs,  Relieving factors: rest  PRECAUTIONS: None  RED FLAGS: None   WEIGHT BEARING RESTRICTIONS: No  FALLS:  Has patient fallen in last 6 months? -   LIVING ENVIRONMENT: Lives with: lives with their family Lives in: House/apartment Stairs: 4 STE - handrail  Has following equipment  at home: None  OCCUPATION: Disability  PLOF: Independent with basic ADLs  PATIENT GOALS: decrease knee pain, improve strength and be able to work out  NEXT MD VISIT: PRN  OBJECTIVE:  Note: Objective measures were completed at Evaluation unless otherwise noted.  DIAGNOSTIC FINDINGS: N/A  PATIENT SURVEYS:  LEFS: 34/80  COGNITION: Overall cognitive status: Within functional limits for tasks assessed     SENSATION: WFL  POSTURE: rounded shoulders, forward head,  increased lumbar lordosis, and large body habitus  PALPATION: TTP to distal quad  LOWER EXTREMITY ROM:  Active ROM Right eval Left eval  Hip flexion    Hip extension    Hip abduction    Hip adduction    Hip internal rotation    Hip external rotation    Knee flexion 114 118  Knee extension 0 5  Ankle dorsiflexion    Ankle plantarflexion    Ankle inversion    Ankle eversion     (Blank rows = not tested)  LOWER EXTREMITY MMT:  MMT Right eval Left eval  Hip flexion 3+ 3+  Hip extension    Hip abduction 3+ 3+  Hip adduction    Hip internal rotation    Hip external rotation    Knee flexion 3+ 3+  Knee extension 3+ 3+  Ankle dorsiflexion    Ankle plantarflexion    Ankle inversion    Ankle eversion     (Blank rows = not tested)  LOWER EXTREMITY SPECIAL TESTS:  Knee special tests: Lachman Test: negative  FUNCTIONAL TESTS:  30 Second Sit to Stand: 8 reps  GAIT: Distance walked: 20ft Assistive device utilized: None Level of assistance: Complete Independence Comments: decreased gait speed, antalgia L   TREATMENT: [facility] Adult PT Treatment:                                                DATE: 03/11/24 Therapeutic Exercise: Bike level 5 x 5 mins while gathering subjective and planning session with patient Omega knee extension 15# 2x10 BIL  Omega knee flexion 45# 2x10 Recumbent leg press seat 9, 80# x15, x10 Therapeutic Activity: Squats with UE support @FM  machine 2x10 Cybex standing hip abduction/extension 42.5# x10 ea BIL   [facility] Adult PT Treatment:                                                DATE: 03/04/2024 Therapeutic Exercise: Heel slide x 5 ea Supine QS x 5 - towel under knees Supine SLR x 5 ea S/L hip abd x 5 ea  PATIENT EDUCATION:  Education details: eval findings, LEFS, HEP, POC Person educated: Patient Education method: Explanation, Demonstration, and Handouts Education comprehension: verbalized understanding and returned demonstration  HOME  EXERCISE PROGRAM: Access Code: [email or web address] URL: [email or web address] Date: 03/04/2024 Prepared by: Alm Kingdom  Exercises - Supine Heel Slide  - 1 x daily - 7 x weekly - 2 sets - 10 reps - 5 sec hold - Supine Quadricep Sets  - 1 x daily - 7 x weekly - 2 sets - 10 reps - 5 sec hold - Active Straight Leg Raise with Quad Set  - 1 x daily - 7 x weekly - 3-4 sets - 5 reps - Sidelying Hip  Abduction  - 1 x daily - 7 x weekly - 3-4 sets - 5 reps  ASSESSMENT:  CLINICAL IMPRESSION: Patient presents to first follow up PT session reporting that she does not have pain at rest but does have pain with walking greater than 20 minutes. Session today focused on proximal hip and LE strengthening with focus on quads. Patient was able to tolerate all prescribed exercises with no adverse effects. Patient continues to benefit from skilled PT services and should be progressed as able to improve functional independence.   EVAL: Patient is a 33 y.o. F who was seen today for physical therapy evaluation and treatment for bilateral knee pain and discomfort. Physical findings are consistent with injury/surgical hx along with bilateral LE weakness particularly in lateral hip and quad. Her LEFS score shows moderate disability in performance of home ADLs and community level activities. Pt would benefit from skilled PT services with specific focus on LE strengthening for improving movement and decreasing knee pain.   OBJECTIVE IMPAIRMENTS: Abnormal gait, decreased activity tolerance, decreased mobility, difficulty walking, decreased ROM, decreased strength, and pain  ACTIVITY LIMITATIONS: carrying, lifting, sitting, standing, squatting, stairs, transfers, and locomotion level  PARTICIPATION LIMITATIONS: meal prep, cleaning, driving, shopping, community activity, and yard work  PERSONAL FACTORS: Time since onset of injury/illness/exacerbation and 3+ comorbidities: Bipolar disorder, Depression, intellectual  disability, bilateral ACL reconstruction  are also affecting patient's functional outcome.   REHAB POTENTIAL: Good  CLINICAL DECISION MAKING: Evolving/moderate complexity  EVALUATION COMPLEXITY: Moderate   GOALS: Goals reviewed with patient? No  SHORT TERM GOALS: Target date: 03/25/2024   Pt will be compliant and knowledgeable with initial HEP for improved comfort and carryover Baseline: initial HEP given  Goal status: INITIAL  2.  Pt will self report bilateral knee pain no greater than 7/10 for improved comfort and functional ability Baseline: 10/10 at worst Goal status: INITIAL   LONG TERM GOALS: Target date: 04/29/2024   Pt will improve LEFS to no less than 50/80 as proxy for functional improvement with home ADLs and higher level community activity Baseline: 34/80 Goal status: INITIAL   2.  Pt will self report bilateral knee pain no greater than 3/10 for improved comfort and functional ability Baseline: 10/10 at worst Goal status: INITIAL   3.  Pt will increase 30 Second Sit to Stand rep count to no less than 12 reps for improved balance, strength, and functional mobility Baseline: 8 reps  Goal status: INITIAL   4.  Pt will improve all LE MMT to no less than 4/5 for all tested motions for improved knee stabilization and decreased pain Baseline: see MMT chart Goal status: INITIAL  5.  Pt will be able to squat with hands to floor without increase in bilateral knee pain for improved comfort and functional ability  Baseline: unable Goal status: INITIAL    PLAN:  PT FREQUENCY: 2x/week  PT DURATION: 8 weeks  PLANNED INTERVENTIONS: 97164- PT Re-evaluation, 97110-Therapeutic exercises, 97530- Therapeutic activity, 97112- Neuromuscular re-education, 97535- Self Care, [id number]- Manual therapy, [id number]- Gait training, H9716- Electrical stimulation (unattended), [id number]- Electrical stimulation (manual), 97016- Vasopneumatic device, Cryotherapy, and Moist heat  PLAN FOR NEXT  SESSION: assess HEP response, quad and hip strengthening, progress functional lifting movements    Corean Pouch, PTA 03/11/2024, 2:37 PM

## 2024-03-17 ENCOUNTER — Ambulatory Visit

## 2024-03-17 DIAGNOSIS — G8929 Other chronic pain: Secondary | ICD-10-CM

## 2024-03-17 DIAGNOSIS — R2689 Other abnormalities of gait and mobility: Secondary | ICD-10-CM | POA: Diagnosis not present

## 2024-03-17 DIAGNOSIS — M25562 Pain in left knee: Secondary | ICD-10-CM | POA: Diagnosis not present

## 2024-03-17 DIAGNOSIS — M6281 Muscle weakness (generalized): Secondary | ICD-10-CM | POA: Diagnosis not present

## 2024-03-17 DIAGNOSIS — M25561 Pain in right knee: Secondary | ICD-10-CM | POA: Diagnosis not present

## 2024-03-17 NOTE — Therapy (Signed)
 OUTPATIENT PHYSICAL THERAPY TREATMENT NOTE   Patient Name: Cindy Phillips MRN: [id number] DOB:[birth year], 33 y.o., female Today's Date: 03/17/2024  END OF SESSION:  PT End of Session - 03/17/24 1431     Visit Number 3    Number of Visits 17    Date for PT Re-Evaluation 04/29/24    Authorization Type [employer] Dual Complete    PT Start Time 1445    PT Stop Time 1523    PT Time Calculation (min) 38 min    Activity Tolerance Patient tolerated treatment well    Behavior During Therapy WFL for tasks assessed/performed          Past Medical History:  Diagnosis Date   Arthritis    Bipolar 1 disorder (HCC)    Bowel incontinence    Depression    Diabetes mellitus without complication (HCC)    Headache    migraines   Mental disability    Sleep apnea    Past Surgical History:  Procedure Laterality Date   ANTERIOR CRUCIATE LIGAMENT REPAIR Right 05/11/2022   Procedure: RIGHT KNEE ARTHROSCOPY; LATERAL MENISECTOMY; ANTERIOR CRUCIATE LIGAMENT (ACL) REPAIR; CHONDROPLASTY;  Surgeon: Shari Sieving, MD;  Location: [facility];  Service: Orthopedics;  Laterality: Right;   FOOT SURGERY     KNEE ARTHROSCOPY W/ ACL RECONSTRUCTION Left 10/12/2020   TOOTH EXTRACTION     Patient Active Problem List   Diagnosis Date Noted   Pain in left elbow 07/27/2020   Prediabetes 02/03/2019   Obstructive sleep apnea 04/23/2018   Other fatigue 11/26/2017   Shortness of breath on exertion 11/26/2017   Type 2 diabetes mellitus without complication, without long-term current use of insulin  (HCC) 11/26/2017   Other hyperlipidemia 11/26/2017   Vitamin D  deficiency 11/26/2017   Schizoaffective disorder, bipolar type (HCC) 09/14/2014   Intellectual disability 09/10/2014    PCP: Leigh Lung, MD  REFERRING PROVIDER: Shari Sieving, MD  REFERRING DIAG: Hx of ACL Reconstruction   THERAPY DIAG:  Chronic pain of left knee  Chronic pain of right knee  Muscle weakness (generalized)  Other abnormalities of  gait and mobility  Rationale for Evaluation and Treatment: Rehabilitation  ONSET DATE: Chronic  SUBJECTIVE:   SUBJECTIVE STATEMENT: Patient reports that her knee almost buckled on her way in, but that it didn't. No pain today.  EVAL: Pt presents to PT with reports of chronic bilateral knee pain in presence of previous ACL repairs. She is well known to therapist and has been seen for both ACL recoveries in past. She currently has significant pain when standing for long periods of time, or when walking/squatting. Denies referral of symptoms past knee, pain is sharp and along bilateral knee joint lines.   PERTINENT HISTORY: Bipolar disorder, Depression, intellectual disability, bilateral ACL reconstruction   PAIN:  Are you having pain?  Yes: NPRS scale: 7/10 Worst: 10/10 Pain location: bilateral inferior knee Pain description: sharp, sore Aggravating factors: standing, stairs,  Relieving factors: rest  PRECAUTIONS: None  RED FLAGS: None   WEIGHT BEARING RESTRICTIONS: No  FALLS:  Has patient fallen in last 6 months? -   LIVING ENVIRONMENT: Lives with: lives with their family Lives in: House/apartment Stairs: 4 STE - handrail  Has following equipment at home: None  OCCUPATION: Disability  PLOF: Independent with basic ADLs  PATIENT GOALS: decrease knee pain, improve strength and be able to work out  NEXT MD VISIT: PRN  OBJECTIVE:  Note: Objective measures were completed at Evaluation unless otherwise noted.  DIAGNOSTIC FINDINGS: N/A  PATIENT SURVEYS:  LEFS: 34/80  COGNITION: Overall cognitive status: Within functional limits for tasks assessed     SENSATION: WFL  POSTURE: rounded shoulders, forward head, increased lumbar lordosis, and large body habitus  PALPATION: TTP to distal quad  LOWER EXTREMITY ROM:  Active ROM Right eval Left eval  Hip flexion    Hip extension    Hip abduction    Hip adduction    Hip internal rotation    Hip external  rotation    Knee flexion 114 118  Knee extension 0 5  Ankle dorsiflexion    Ankle plantarflexion    Ankle inversion    Ankle eversion     (Blank rows = not tested)  LOWER EXTREMITY MMT:  MMT Right eval Left eval  Hip flexion 3+ 3+  Hip extension    Hip abduction 3+ 3+  Hip adduction    Hip internal rotation    Hip external rotation    Knee flexion 3+ 3+  Knee extension 3+ 3+  Ankle dorsiflexion    Ankle plantarflexion    Ankle inversion    Ankle eversion     (Blank rows = not tested)  LOWER EXTREMITY SPECIAL TESTS:  Knee special tests: Lachman Test: negative  FUNCTIONAL TESTS:  30 Second Sit to Stand: 8 reps  GAIT: Distance walked: 74ft Assistive device utilized: None Level of assistance: Complete Independence Comments: decreased gait speed, antalgia L   TREATMENT: [facility] Adult PT Treatment:                                                DATE: 03/17/24 Therapeutic Exercise: Bike level 3 x 5 mins while gathering subjective and planning session with patient Omega knee extension 35# 2x8 BIL  Omega knee flexion 45#, 35# x10 Recumbent leg press seat 9, 80# x8 Therapeutic Activity: Squats with UE support @FM  machine 2x10 Cybex standing hip abduction/extension 42.5# x10 ea BIL  [facility] Adult PT Treatment:                                                DATE: 03/11/24 Therapeutic Exercise: Bike level 5 x 5 mins while gathering subjective and planning session with patient Omega knee extension 15# 2x10 BIL  Omega knee flexion 45# 2x10 Recumbent leg press seat 9, 80# x15, x10 Therapeutic Activity: Squats with UE support @FM  machine 2x10 Cybex standing hip abduction/extension 42.5# x10 ea BIL   [facility] Adult PT Treatment:                                                DATE: 03/04/2024 Therapeutic Exercise: Heel slide x 5 ea Supine QS x 5 - towel under knees Supine SLR x 5 ea S/L hip abd x 5 ea  PATIENT EDUCATION:  Education details: eval findings, LEFS, HEP, POC Person  educated: Patient Education method: Explanation, Demonstration, and Handouts Education comprehension: verbalized understanding and returned demonstration  HOME EXERCISE PROGRAM: Access Code: [email or web address] URL: [email or web address] Date: 03/04/2024 Prepared by: Alm Kingdom  Exercises - Supine Heel Slide  - 1 x daily - 7 x weekly -  2 sets - 10 reps - 5 sec hold - Supine Quadricep Sets  - 1 x daily - 7 x weekly - 2 sets - 10 reps - 5 sec hold - Active Straight Leg Raise with Quad Set  - 1 x daily - 7 x weekly - 3-4 sets - 5 reps - Sidelying Hip Abduction  - 1 x daily - 7 x weekly - 3-4 sets - 5 reps  ASSESSMENT:  CLINICAL IMPRESSION: Patient presents to PT reporting no current pain the knee, but that she notices the pain when she's walking and bending her knee. She states that the Lt knee almost buckled on her way in, but that she was able to catch it before it did. Session today continued to focus on LE strengthening with particular focus on the quads. She did have some pain in the Lt knee with flexion motions during the session. Will focus more on stretching at session tomorrow since back to back days. Patient continues to benefit from skilled PT services and should be progressed as able to improve functional independence.   EVAL: Patient is a 33 y.o. F who was seen today for physical therapy evaluation and treatment for bilateral knee pain and discomfort. Physical findings are consistent with injury/surgical hx along with bilateral LE weakness particularly in lateral hip and quad. Her LEFS score shows moderate disability in performance of home ADLs and community level activities. Pt would benefit from skilled PT services with specific focus on LE strengthening for improving movement and decreasing knee pain.   OBJECTIVE IMPAIRMENTS: Abnormal gait, decreased activity tolerance, decreased mobility, difficulty walking, decreased ROM, decreased strength, and pain  ACTIVITY  LIMITATIONS: carrying, lifting, sitting, standing, squatting, stairs, transfers, and locomotion level  PARTICIPATION LIMITATIONS: meal prep, cleaning, driving, shopping, community activity, and yard work  PERSONAL FACTORS: Time since onset of injury/illness/exacerbation and 3+ comorbidities: Bipolar disorder, Depression, intellectual disability, bilateral ACL reconstruction  are also affecting patient's functional outcome.   REHAB POTENTIAL: Good  CLINICAL DECISION MAKING: Evolving/moderate complexity  EVALUATION COMPLEXITY: Moderate   GOALS: Goals reviewed with patient? No  SHORT TERM GOALS: Target date: 03/25/2024   Pt will be compliant and knowledgeable with initial HEP for improved comfort and carryover Baseline: initial HEP given  Goal status: INITIAL  2.  Pt will self report bilateral knee pain no greater than 7/10 for improved comfort and functional ability Baseline: 10/10 at worst Goal status: INITIAL   LONG TERM GOALS: Target date: 04/29/2024   Pt will improve LEFS to no less than 50/80 as proxy for functional improvement with home ADLs and higher level community activity Baseline: 34/80 Goal status: INITIAL   2.  Pt will self report bilateral knee pain no greater than 3/10 for improved comfort and functional ability Baseline: 10/10 at worst Goal status: INITIAL   3.  Pt will increase 30 Second Sit to Stand rep count to no less than 12 reps for improved balance, strength, and functional mobility Baseline: 8 reps  Goal status: INITIAL   4.  Pt will improve all LE MMT to no less than 4/5 for all tested motions for improved knee stabilization and decreased pain Baseline: see MMT chart Goal status: INITIAL  5.  Pt will be able to squat with hands to floor without increase in bilateral knee pain for improved comfort and functional ability  Baseline: unable Goal status: INITIAL    PLAN:  PT FREQUENCY: 2x/week  PT DURATION: 8 weeks  PLANNED INTERVENTIONS: 02835-  PT Re-evaluation, 97110-Therapeutic  exercises, 97530- Therapeutic activity, [id number]- Neuromuscular re-education, [id number]- Self Care, [id number]- Manual therapy, [id number]- Gait training, [phone]- Electrical stimulation (unattended), [id number]- Electrical stimulation (manual), [id number]- Vasopneumatic device, Cryotherapy, and Moist heat  PLAN FOR NEXT SESSION: assess HEP response, quad and hip strengthening, progress functional lifting movements    Corean Pouch, PTA 03/17/2024, 3:29 PM

## 2024-03-18 ENCOUNTER — Ambulatory Visit

## 2024-03-18 DIAGNOSIS — M25561 Pain in right knee: Secondary | ICD-10-CM | POA: Diagnosis not present

## 2024-03-18 DIAGNOSIS — R2689 Other abnormalities of gait and mobility: Secondary | ICD-10-CM

## 2024-03-18 DIAGNOSIS — M6281 Muscle weakness (generalized): Secondary | ICD-10-CM | POA: Diagnosis not present

## 2024-03-18 DIAGNOSIS — M25562 Pain in left knee: Secondary | ICD-10-CM | POA: Diagnosis not present

## 2024-03-18 DIAGNOSIS — G8929 Other chronic pain: Secondary | ICD-10-CM | POA: Diagnosis not present

## 2024-03-18 NOTE — Therapy (Signed)
 OUTPATIENT PHYSICAL THERAPY TREATMENT NOTE   Patient Name: Cindy Phillips MRN: [id number] DOB:1991/04/04, 33 y.o., female Today's Date: 03/18/2024  END OF SESSION:  PT End of Session - 03/18/24 1437     Visit Number 4    Number of Visits 17    Date for PT Re-Evaluation 04/29/24    Authorization Type [employer] Dual Complete    PT Start Time 1445    PT Stop Time 1523    PT Time Calculation (min) 38 min    Activity Tolerance Patient tolerated treatment well    Behavior During Therapy WFL for tasks assessed/performed           Past Medical History:  Diagnosis Date   Arthritis    Bipolar 1 disorder (HCC)    Bowel incontinence    Depression    Diabetes mellitus without complication (HCC)    Headache    migraines   Mental disability    Sleep apnea    Past Surgical History:  Procedure Laterality Date   ANTERIOR CRUCIATE LIGAMENT REPAIR Right 05/11/2022   Procedure: RIGHT KNEE ARTHROSCOPY; LATERAL MENISECTOMY; ANTERIOR CRUCIATE LIGAMENT (ACL) REPAIR; CHONDROPLASTY;  Surgeon: Shari Sieving, MD;  Location: [facility];  Service: Orthopedics;  Laterality: Right;   FOOT SURGERY     KNEE ARTHROSCOPY W/ ACL RECONSTRUCTION Left 10/12/2020   TOOTH EXTRACTION     Patient Active Problem List   Diagnosis Date Noted   Pain in left elbow 07/27/2020   Prediabetes 02/03/2019   Obstructive sleep apnea 04/23/2018   Other fatigue 11/26/2017   Shortness of breath on exertion 11/26/2017   Type 2 diabetes mellitus without complication, without long-term current use of insulin  (HCC) 11/26/2017   Other hyperlipidemia 11/26/2017   Vitamin D  deficiency 11/26/2017   Schizoaffective disorder, bipolar type (HCC) 09/14/2014   Intellectual disability 09/10/2014    PCP: Leigh Lung, MD  REFERRING PROVIDER: Shari Sieving, MD  REFERRING DIAG: Hx of ACL Reconstruction   THERAPY DIAG:  Chronic pain of left knee  Chronic pain of right knee  Muscle weakness (generalized)  Other abnormalities of  gait and mobility  Rationale for Evaluation and Treatment: Rehabilitation  ONSET DATE: Chronic  SUBJECTIVE:   SUBJECTIVE STATEMENT: Patient reports no pain, no soreness from session yesterday.   EVAL: Pt presents to PT with reports of chronic bilateral knee pain in presence of previous ACL repairs. She is well known to therapist and has been seen for both ACL recoveries in past. She currently has significant pain when standing for long periods of time, or when walking/squatting. Denies referral of symptoms past knee, pain is sharp and along bilateral knee joint lines.   PERTINENT HISTORY: Bipolar disorder, Depression, intellectual disability, bilateral ACL reconstruction   PAIN:  Are you having pain?  Yes: NPRS scale: 7/10 Worst: 10/10 Pain location: bilateral inferior knee Pain description: sharp, sore Aggravating factors: standing, stairs,  Relieving factors: rest  PRECAUTIONS: None  RED FLAGS: None   WEIGHT BEARING RESTRICTIONS: No  FALLS:  Has patient fallen in last 6 months? -   LIVING ENVIRONMENT: Lives with: lives with their family Lives in: House/apartment Stairs: 4 STE - handrail  Has following equipment at home: None  OCCUPATION: Disability  PLOF: Independent with basic ADLs  PATIENT GOALS: decrease knee pain, improve strength and be able to work out  NEXT MD VISIT: PRN  OBJECTIVE:  Note: Objective measures were completed at Evaluation unless otherwise noted.  DIAGNOSTIC FINDINGS: N/A  PATIENT SURVEYS:  LEFS: 34/80  COGNITION:  Overall cognitive status: Within functional limits for tasks assessed     SENSATION: WFL  POSTURE: rounded shoulders, forward head, increased lumbar lordosis, and large body habitus  PALPATION: TTP to distal quad  LOWER EXTREMITY ROM:  Active ROM Right eval Left eval  Hip flexion    Hip extension    Hip abduction    Hip adduction    Hip internal rotation    Hip external rotation    Knee flexion 114 118   Knee extension 0 5  Ankle dorsiflexion    Ankle plantarflexion    Ankle inversion    Ankle eversion     (Blank rows = not tested)  LOWER EXTREMITY MMT:  MMT Right eval Left eval  Hip flexion 3+ 3+  Hip extension    Hip abduction 3+ 3+  Hip adduction    Hip internal rotation    Hip external rotation    Knee flexion 3+ 3+  Knee extension 3+ 3+  Ankle dorsiflexion    Ankle plantarflexion    Ankle inversion    Ankle eversion     (Blank rows = not tested)  LOWER EXTREMITY SPECIAL TESTS:  Knee special tests: Lachman Test: negative  FUNCTIONAL TESTS:  30 Second Sit to Stand: 8 reps  GAIT: Distance walked: 37ft Assistive device utilized: None Level of assistance: Complete Independence Comments: decreased gait speed, antalgia L   TREATMENT: [facility] Adult PT Treatment:                                                DATE: 03/18/24 Therapeutic Exercise: Bike level 3 x 5 mins while gathering subjective and planning session with patient Slant board calf stretch 2x1' Seated hamstring stretch 2x30 BIL SLR 2x10 Therapeutic Activity: Backwards walking on treadmill (not on) x3 mins (next session) Sidestepping RTB at knees x5 laps at [employer] walks fwd/bwd RTB at knees 2 laps x20 ft. Runners step up 8 x10 BIL   [facility] Adult PT Treatment:                                                DATE: 03/17/24 Therapeutic Exercise: Bike level 3 x 5 mins while gathering subjective and planning session with patient Omega knee extension 35# 2x8 BIL  Omega knee flexion 45#, 35# x10 Recumbent leg press seat 9, 80# x8 Therapeutic Activity: Squats with UE support @FM  machine 2x10 Cybex standing hip abduction/extension 42.5# x10 ea BIL  [facility] Adult PT Treatment:                                                DATE: 03/11/24 Therapeutic Exercise: Bike level 5 x 5 mins while gathering subjective and planning session with patient Omega knee extension 15# 2x10 BIL  Omega knee flexion 45#  2x10 Recumbent leg press seat 9, 80# x15, x10 Therapeutic Activity: Squats with UE support @FM  machine 2x10 Cybex standing hip abduction/extension 42.5# x10 ea BIL   PATIENT EDUCATION:  Education details: eval findings, LEFS, HEP, POC Person educated: Patient Education method: Explanation, Demonstration, and Handouts Education comprehension: verbalized understanding and returned  demonstration  HOME EXERCISE PROGRAM: Access Code: [email or web address] URL: [email or web address] Date: 03/04/2024 Prepared by: Alm Kingdom  Exercises - Supine Heel Slide  - 1 x daily - 7 x weekly - 2 sets - 10 reps - 5 sec hold - Supine Quadricep Sets  - 1 x daily - 7 x weekly - 2 sets - 10 reps - 5 sec hold - Active Straight Leg Raise with Quad Set  - 1 x daily - 7 x weekly - 3-4 sets - 5 reps - Sidelying Hip Abduction  - 1 x daily - 7 x weekly - 3-4 sets - 5 reps  ASSESSMENT:  CLINICAL IMPRESSION: Patient presents to PT reporting no current pain and no soreness from yesterdays session. Session today focused more on stretching and hip abductor strengthening since she had back to back sessions this week. Patient was able to tolerate all prescribed exercises with no adverse effects. Patient continues to benefit from skilled PT services and should be progressed as able to improve functional independence.   EVAL: Patient is a 33 y.o. F who was seen today for physical therapy evaluation and treatment for bilateral knee pain and discomfort. Physical findings are consistent with injury/surgical hx along with bilateral LE weakness particularly in lateral hip and quad. Her LEFS score shows moderate disability in performance of home ADLs and community level activities. Pt would benefit from skilled PT services with specific focus on LE strengthening for improving movement and decreasing knee pain.   OBJECTIVE IMPAIRMENTS: Abnormal gait, decreased activity tolerance, decreased mobility, difficulty walking,  decreased ROM, decreased strength, and pain  ACTIVITY LIMITATIONS: carrying, lifting, sitting, standing, squatting, stairs, transfers, and locomotion level  PARTICIPATION LIMITATIONS: meal prep, cleaning, driving, shopping, community activity, and yard work  PERSONAL FACTORS: Time since onset of injury/illness/exacerbation and 3+ comorbidities: Bipolar disorder, Depression, intellectual disability, bilateral ACL reconstruction  are also affecting patient's functional outcome.   REHAB POTENTIAL: Good  CLINICAL DECISION MAKING: Evolving/moderate complexity  EVALUATION COMPLEXITY: Moderate   GOALS: Goals reviewed with patient? No  SHORT TERM GOALS: Target date: 03/25/2024   Pt will be compliant and knowledgeable with initial HEP for improved comfort and carryover Baseline: initial HEP given  Goal status: INITIAL  2.  Pt will self report bilateral knee pain no greater than 7/10 for improved comfort and functional ability Baseline: 10/10 at worst Goal status: INITIAL   LONG TERM GOALS: Target date: 04/29/2024   Pt will improve LEFS to no less than 50/80 as proxy for functional improvement with home ADLs and higher level community activity Baseline: 34/80 Goal status: INITIAL   2.  Pt will self report bilateral knee pain no greater than 3/10 for improved comfort and functional ability Baseline: 10/10 at worst Goal status: INITIAL   3.  Pt will increase 30 Second Sit to Stand rep count to no less than 12 reps for improved balance, strength, and functional mobility Baseline: 8 reps  Goal status: INITIAL   4.  Pt will improve all LE MMT to no less than 4/5 for all tested motions for improved knee stabilization and decreased pain Baseline: see MMT chart Goal status: INITIAL  5.  Pt will be able to squat with hands to floor without increase in bilateral knee pain for improved comfort and functional ability  Baseline: unable Goal status: INITIAL    PLAN:  PT FREQUENCY:  2x/week  PT DURATION: 8 weeks  PLANNED INTERVENTIONS: [id number]- PT Re-evaluation, 97110-Therapeutic exercises, 97530- Therapeutic activity, 97112- Neuromuscular re-education, 97535- Self Care, 02859-  Manual therapy, [id number]- Gait training, [phone]- Electrical stimulation (unattended), [id number]- Electrical stimulation (manual), 02983- Vasopneumatic device, Cryotherapy, and Moist heat  PLAN FOR NEXT SESSION: assess HEP response, quad and hip strengthening, progress functional lifting movements    Corean Pouch, PTA 03/18/2024, 3:24 PM

## 2024-03-19 ENCOUNTER — Ambulatory Visit

## 2024-03-24 DIAGNOSIS — I10 Essential (primary) hypertension: Secondary | ICD-10-CM | POA: Diagnosis not present

## 2024-03-24 DIAGNOSIS — Z0000 Encounter for general adult medical examination without abnormal findings: Secondary | ICD-10-CM | POA: Diagnosis not present

## 2024-03-24 DIAGNOSIS — E1169 Type 2 diabetes mellitus with other specified complication: Secondary | ICD-10-CM | POA: Diagnosis not present

## 2024-03-25 ENCOUNTER — Ambulatory Visit: Attending: Orthopedic Surgery

## 2024-03-25 DIAGNOSIS — G8929 Other chronic pain: Secondary | ICD-10-CM | POA: Diagnosis not present

## 2024-03-25 DIAGNOSIS — R2689 Other abnormalities of gait and mobility: Secondary | ICD-10-CM | POA: Insufficient documentation

## 2024-03-25 DIAGNOSIS — M25561 Pain in right knee: Secondary | ICD-10-CM | POA: Insufficient documentation

## 2024-03-25 DIAGNOSIS — M6281 Muscle weakness (generalized): Secondary | ICD-10-CM | POA: Insufficient documentation

## 2024-03-25 DIAGNOSIS — M25562 Pain in left knee: Secondary | ICD-10-CM | POA: Diagnosis not present

## 2024-03-25 NOTE — Therapy (Signed)
 OUTPATIENT PHYSICAL THERAPY TREATMENT NOTE   Patient Name: Cindy Phillips MRN: [id number] DOB:10-09-1990, 33 y.o., female Today's Date: 03/25/2024  END OF SESSION:  PT End of Session - 03/25/24 1215     Visit Number 5    Number of Visits 17    Date for PT Re-Evaluation 04/29/24    Authorization Type [employer] Dual Complete    PT Start Time 1215    PT Stop Time 1253    PT Time Calculation (min) 38 min    Activity Tolerance Patient tolerated treatment well    Behavior During Therapy WFL for tasks assessed/performed           Past Medical History:  Diagnosis Date   Arthritis    Bipolar 1 disorder (HCC)    Bowel incontinence    Depression    Diabetes mellitus without complication (HCC)    Headache    migraines   Mental disability    Sleep apnea    Past Surgical History:  Procedure Laterality Date   ANTERIOR CRUCIATE LIGAMENT REPAIR Right 05/11/2022   Procedure: RIGHT KNEE ARTHROSCOPY; LATERAL MENISECTOMY; ANTERIOR CRUCIATE LIGAMENT (ACL) REPAIR; CHONDROPLASTY;  Surgeon: Shari Sieving, MD;  Location: [facility];  Service: Orthopedics;  Laterality: Right;   FOOT SURGERY     KNEE ARTHROSCOPY W/ ACL RECONSTRUCTION Left 10/12/2020   TOOTH EXTRACTION     Patient Active Problem List   Diagnosis Date Noted   Pain in left elbow 07/27/2020   Prediabetes 02/03/2019   Obstructive sleep apnea 04/23/2018   Other fatigue 11/26/2017   Shortness of breath on exertion 11/26/2017   Type 2 diabetes mellitus without complication, without long-term current use of insulin  (HCC) 11/26/2017   Other hyperlipidemia 11/26/2017   Vitamin D  deficiency 11/26/2017   Schizoaffective disorder, bipolar type (HCC) 09/14/2014   Intellectual disability 09/10/2014    PCP: Leigh Lung, MD  REFERRING PROVIDER: Shari Sieving, MD  REFERRING DIAG: Hx of ACL Reconstruction   THERAPY DIAG:  Chronic pain of left knee  Chronic pain of right knee  Muscle weakness (generalized)  Other abnormalities of  gait and mobility  Rationale for Evaluation and Treatment: Rehabilitation  ONSET DATE: Chronic  SUBJECTIVE:   SUBJECTIVE STATEMENT: Patent reports pain in her LLE today, states that her RLE is ok. Felt ok after last session.   EVAL: Pt presents to PT with reports of chronic bilateral knee pain in presence of previous ACL repairs. She is well known to therapist and has been seen for both ACL recoveries in past. She currently has significant pain when standing for long periods of time, or when walking/squatting. Denies referral of symptoms past knee, pain is sharp and along bilateral knee joint lines.   PERTINENT HISTORY: Bipolar disorder, Depression, intellectual disability, bilateral ACL reconstruction   PAIN:  Are you having pain?  Yes: NPRS scale: 7/10 Worst: 10/10 Pain location: bilateral inferior knee Pain description: sharp, sore Aggravating factors: standing, stairs,  Relieving factors: rest  PRECAUTIONS: None  RED FLAGS: None   WEIGHT BEARING RESTRICTIONS: No  FALLS:  Has patient fallen in last 6 months? -   LIVING ENVIRONMENT: Lives with: lives with their family Lives in: House/apartment Stairs: 4 STE - handrail  Has following equipment at home: None  OCCUPATION: Disability  PLOF: Independent with basic ADLs  PATIENT GOALS: decrease knee pain, improve strength and be able to work out  NEXT MD VISIT: PRN  OBJECTIVE:  Note: Objective measures were completed at Evaluation unless otherwise noted.  DIAGNOSTIC FINDINGS:  N/A  PATIENT SURVEYS:  LEFS: 34/80  COGNITION: Overall cognitive status: Within functional limits for tasks assessed     SENSATION: WFL  POSTURE: rounded shoulders, forward head, increased lumbar lordosis, and large body habitus  PALPATION: TTP to distal quad  LOWER EXTREMITY ROM:  Active ROM Right eval Left eval  Hip flexion    Hip extension    Hip abduction    Hip adduction    Hip internal rotation    Hip external  rotation    Knee flexion 114 118  Knee extension 0 5  Ankle dorsiflexion    Ankle plantarflexion    Ankle inversion    Ankle eversion     (Blank rows = not tested)  LOWER EXTREMITY MMT:  MMT Right eval Left eval  Hip flexion 3+ 3+  Hip extension    Hip abduction 3+ 3+  Hip adduction    Hip internal rotation    Hip external rotation    Knee flexion 3+ 3+  Knee extension 3+ 3+  Ankle dorsiflexion    Ankle plantarflexion    Ankle inversion    Ankle eversion     (Blank rows = not tested)  LOWER EXTREMITY SPECIAL TESTS:  Knee special tests: Lachman Test: negative  FUNCTIONAL TESTS:  30 Second Sit to Stand: 8 reps  GAIT: Distance walked: 37ft Assistive device utilized: None Level of assistance: Complete Independence Comments: decreased gait speed, antalgia L   TREATMENT: [facility] Adult PT Treatment:                                                DATE: 03/25/24 Therapeutic Exercise: Bike level 3 x 5 mins while gathering subjective and planning session with patient Slant board calf stretch 2x1' Seated hamstring stretch 2x30 BIL SLR x10 BIL Omega knee extension 25# 2x10 BIL  Omega knee flexion 35# 2x10 Recumbent leg press seat 9, 80# x3 (painful today in Lt knee)  [facility] Adult PT Treatment:                                                DATE: 03/18/24 Therapeutic Exercise: Bike level 3 x 5 mins while gathering subjective and planning session with patient Slant board calf stretch 2x1' Seated hamstring stretch 2x30 BIL SLR 2x10 Therapeutic Activity: Backwards walking on treadmill (not on) x3 mins (next session) Sidestepping RTB at knees x5 laps at [employer] walks fwd/bwd RTB at knees 2 laps x20 ft. Runners step up 8 x10 BIL   [facility] Adult PT Treatment:                                                DATE: 03/17/24 Therapeutic Exercise: Bike level 3 x 5 mins while gathering subjective and planning session with patient Omega knee extension 35# 2x8 BIL  Omega knee  flexion 45#, 35# x10 Recumbent leg press seat 9, 80# x8 Therapeutic Activity: Squats with UE support @FM  machine 2x10 Cybex standing hip abduction/extension 42.5# x10 ea BIL    PATIENT EDUCATION:  Education details: eval findings, LEFS, HEP, POC Person educated: Patient Education method:  Explanation, Demonstration, and Handouts Education comprehension: verbalized understanding and returned demonstration  HOME EXERCISE PROGRAM: Access Code: [email or web address] URL: [email or web address] Date: 03/04/2024 Prepared by: Alm Kingdom  Exercises - Supine Heel Slide  - 1 x daily - 7 x weekly - 2 sets - 10 reps - 5 sec hold - Supine Quadricep Sets  - 1 x daily - 7 x weekly - 2 sets - 10 reps - 5 sec hold - Active Straight Leg Raise with Quad Set  - 1 x daily - 7 x weekly - 3-4 sets - 5 reps - Sidelying Hip Abduction  - 1 x daily - 7 x weekly - 3-4 sets - 5 reps  ASSESSMENT:  CLINICAL IMPRESSION: Patient presents to reporting increased pain in her LLE today, no pain in RLE, describes pain in entire leg and not just the knee. Session today focused on strengthening and stretching for BIL LE. She had increased pain with leg press today, describes sharp pain on the lateral and posterior portions of her knee. Patient continues to benefit from skilled PT services and should be progressed as able to improve functional independence.   EVAL: Patient is a 33 y.o. F who was seen today for physical therapy evaluation and treatment for bilateral knee pain and discomfort. Physical findings are consistent with injury/surgical hx along with bilateral LE weakness particularly in lateral hip and quad. Her LEFS score shows moderate disability in performance of home ADLs and community level activities. Pt would benefit from skilled PT services with specific focus on LE strengthening for improving movement and decreasing knee pain.   OBJECTIVE IMPAIRMENTS: Abnormal gait, decreased activity tolerance, decreased  mobility, difficulty walking, decreased ROM, decreased strength, and pain  ACTIVITY LIMITATIONS: carrying, lifting, sitting, standing, squatting, stairs, transfers, and locomotion level  PARTICIPATION LIMITATIONS: meal prep, cleaning, driving, shopping, community activity, and yard work  PERSONAL FACTORS: Time since onset of injury/illness/exacerbation and 3+ comorbidities: Bipolar disorder, Depression, intellectual disability, bilateral ACL reconstruction  are also affecting patient's functional outcome.   REHAB POTENTIAL: Good  CLINICAL DECISION MAKING: Evolving/moderate complexity  EVALUATION COMPLEXITY: Moderate   GOALS: Goals reviewed with patient? No  SHORT TERM GOALS: Target date: 03/25/2024   Pt will be compliant and knowledgeable with initial HEP for improved comfort and carryover Baseline: initial HEP given  Goal status: INITIAL  2.  Pt will self report bilateral knee pain no greater than 7/10 for improved comfort and functional ability Baseline: 10/10 at worst Goal status: INITIAL   LONG TERM GOALS: Target date: 04/29/2024   Pt will improve LEFS to no less than 50/80 as proxy for functional improvement with home ADLs and higher level community activity Baseline: 34/80 Goal status: INITIAL   2.  Pt will self report bilateral knee pain no greater than 3/10 for improved comfort and functional ability Baseline: 10/10 at worst Goal status: INITIAL   3.  Pt will increase 30 Second Sit to Stand rep count to no less than 12 reps for improved balance, strength, and functional mobility Baseline: 8 reps  Goal status: INITIAL   4.  Pt will improve all LE MMT to no less than 4/5 for all tested motions for improved knee stabilization and decreased pain Baseline: see MMT chart Goal status: INITIAL  5.  Pt will be able to squat with hands to floor without increase in bilateral knee pain for improved comfort and functional ability  Baseline: unable Goal status:  INITIAL    PLAN:  PT FREQUENCY: 2x/week  PT DURATION: 8  weeks  PLANNED INTERVENTIONS: 97164- PT Re-evaluation, 97110-Therapeutic exercises, 97530- Therapeutic activity, [id number]- Neuromuscular re-education, 97535- Self Care, [id number]- Manual therapy, [id number]- Gait training, [phone]- Electrical stimulation (unattended), [id number]- Electrical stimulation (manual), 97016- Vasopneumatic device, Cryotherapy, and Moist heat  PLAN FOR NEXT SESSION: assess HEP response, quad and hip strengthening, progress functional lifting movements    Corean Pouch, PTA 03/25/2024, 12:53 PM

## 2024-03-26 ENCOUNTER — Ambulatory Visit

## 2024-03-26 DIAGNOSIS — M25562 Pain in left knee: Secondary | ICD-10-CM | POA: Diagnosis not present

## 2024-03-26 DIAGNOSIS — G8929 Other chronic pain: Secondary | ICD-10-CM | POA: Diagnosis not present

## 2024-03-26 DIAGNOSIS — R2689 Other abnormalities of gait and mobility: Secondary | ICD-10-CM | POA: Diagnosis not present

## 2024-03-26 DIAGNOSIS — M6281 Muscle weakness (generalized): Secondary | ICD-10-CM

## 2024-03-26 DIAGNOSIS — M25561 Pain in right knee: Secondary | ICD-10-CM | POA: Diagnosis not present

## 2024-03-26 NOTE — Therapy (Signed)
 OUTPATIENT PHYSICAL THERAPY TREATMENT NOTE   Patient Name: Cindy Phillips MRN: [id number] DOB:1991-02-09, 33 y.o., female Today's Date: 03/26/2024  END OF SESSION:  PT End of Session - 03/26/24 1151     Visit Number 6    Number of Visits 17    Date for PT Re-Evaluation 04/29/24    Authorization Type [employer] Dual Complete    PT Start Time 1213    PT Stop Time 1251    PT Time Calculation (min) 38 min    Activity Tolerance Patient tolerated treatment well    Behavior During Therapy WFL for tasks assessed/performed            Past Medical History:  Diagnosis Date   Arthritis    Bipolar 1 disorder (HCC)    Bowel incontinence    Depression    Diabetes mellitus without complication (HCC)    Headache    migraines   Mental disability    Sleep apnea    Past Surgical History:  Procedure Laterality Date   ANTERIOR CRUCIATE LIGAMENT REPAIR Right 05/11/2022   Procedure: RIGHT KNEE ARTHROSCOPY; LATERAL MENISECTOMY; ANTERIOR CRUCIATE LIGAMENT (ACL) REPAIR; CHONDROPLASTY;  Surgeon: Shari Sieving, MD;  Location: [facility];  Service: Orthopedics;  Laterality: Right;   FOOT SURGERY     KNEE ARTHROSCOPY W/ ACL RECONSTRUCTION Left 10/12/2020   TOOTH EXTRACTION     Patient Active Problem List   Diagnosis Date Noted   Pain in left elbow 07/27/2020   Prediabetes 02/03/2019   Obstructive sleep apnea 04/23/2018   Other fatigue 11/26/2017   Shortness of breath on exertion 11/26/2017   Type 2 diabetes mellitus without complication, without long-term current use of insulin  (HCC) 11/26/2017   Other hyperlipidemia 11/26/2017   Vitamin D  deficiency 11/26/2017   Schizoaffective disorder, bipolar type (HCC) 09/14/2014   Intellectual disability 09/10/2014    PCP: Leigh Lung, MD  REFERRING PROVIDER: Shari Sieving, MD  REFERRING DIAG: Hx of ACL Reconstruction   THERAPY DIAG:  Chronic pain of left knee  Chronic pain of right knee  Muscle weakness (generalized)  Other abnormalities  of gait and mobility  Rationale for Evaluation and Treatment: Rehabilitation  ONSET DATE: Chronic  SUBJECTIVE:   SUBJECTIVE STATEMENT: Patient reports that she is having moderate pain today, mostly in the RLE.  EVAL: Pt presents to PT with reports of chronic bilateral knee pain in presence of previous ACL repairs. She is well known to therapist and has been seen for both ACL recoveries in past. She currently has significant pain when standing for long periods of time, or when walking/squatting. Denies referral of symptoms past knee, pain is sharp and along bilateral knee joint lines.   PERTINENT HISTORY: Bipolar disorder, Depression, intellectual disability, bilateral ACL reconstruction   PAIN:  Are you having pain?  Yes: NPRS scale: 7/10 Worst: 10/10 Pain location: bilateral inferior knee Pain description: sharp, sore Aggravating factors: standing, stairs,  Relieving factors: rest  PRECAUTIONS: None  RED FLAGS: None   WEIGHT BEARING RESTRICTIONS: No  FALLS:  Has patient fallen in last 6 months? -   LIVING ENVIRONMENT: Lives with: lives with their family Lives in: House/apartment Stairs: 4 STE - handrail  Has following equipment at home: None  OCCUPATION: Disability  PLOF: Independent with basic ADLs  PATIENT GOALS: decrease knee pain, improve strength and be able to work out  NEXT MD VISIT: PRN  OBJECTIVE:  Note: Objective measures were completed at Evaluation unless otherwise noted.  DIAGNOSTIC FINDINGS: N/A  PATIENT SURVEYS:  LEFS: 34/80  COGNITION: Overall cognitive status: Within functional limits for tasks assessed     SENSATION: WFL  POSTURE: rounded shoulders, forward head, increased lumbar lordosis, and large body habitus  PALPATION: TTP to distal quad  LOWER EXTREMITY ROM:  Active ROM Right eval Left eval  Hip flexion    Hip extension    Hip abduction    Hip adduction    Hip internal rotation    Hip external rotation    Knee  flexion 114 118  Knee extension 0 5  Ankle dorsiflexion    Ankle plantarflexion    Ankle inversion    Ankle eversion     (Blank rows = not tested)  LOWER EXTREMITY MMT:  MMT Right eval Left eval  Hip flexion 3+ 3+  Hip extension    Hip abduction 3+ 3+  Hip adduction    Hip internal rotation    Hip external rotation    Knee flexion 3+ 3+  Knee extension 3+ 3+  Ankle dorsiflexion    Ankle plantarflexion    Ankle inversion    Ankle eversion     (Blank rows = not tested)  LOWER EXTREMITY SPECIAL TESTS:  Knee special tests: Lachman Test: negative  FUNCTIONAL TESTS:  30 Second Sit to Stand: 8 reps  GAIT: Distance walked: 2ft Assistive device utilized: None Level of assistance: Complete Independence Comments: decreased gait speed, antalgia L   TREATMENT: [facility] Adult PT Treatment:                                                DATE: 03/26/24 Therapeutic Exercise: Bike level 3 x 5 mins while gathering subjective and planning session with patient Slant board calf stretch 2x1' Seated hamstring stretch 2x30 BIL SLR x10 RLE Omega knee extension 25# 2x10 BIL  Omega knee flexion 35# 2x10 Heel raises 2x10  [facility] Adult PT Treatment:                                                DATE: 03/25/24 Therapeutic Exercise: Bike level 3 x 5 mins while gathering subjective and planning session with patient Slant board calf stretch 2x1' Seated hamstring stretch 2x30 BIL SLR x10 BIL Omega knee extension 25# 2x10 BIL  Omega knee flexion 35# 2x10 Recumbent leg press seat 9, 80# x3 (painful today in Lt knee)  [facility] Adult PT Treatment:                                                DATE: 03/18/24 Therapeutic Exercise: Bike level 3 x 5 mins while gathering subjective and planning session with patient Slant board calf stretch 2x1' Seated hamstring stretch 2x30 BIL SLR 2x10 Therapeutic Activity: Sidestepping RTB at knees x5 laps at [employer] walks fwd/bwd RTB at knees 2 laps x20  ft. Runners step up 8 x10 BIL   PATIENT EDUCATION:  Education details: eval findings, LEFS, HEP, POC Person educated: Patient Education method: Explanation, Demonstration, and Handouts Education comprehension: verbalized understanding and returned demonstration  HOME EXERCISE PROGRAM: Access Code: [email or web address] URL: [email or web address] Date: 03/04/2024 Prepared by: Alm Kingdom  Exercises - Supine Heel Slide  - 1 x daily - 7 x weekly - 2 sets - 10 reps - 5 sec hold - Supine Quadricep Sets  - 1 x daily - 7 x weekly - 2 sets - 10 reps - 5 sec hold - Active Straight Leg Raise with Quad Set  - 1 x daily - 7 x weekly - 3-4 sets - 5 reps - Sidelying Hip Abduction  - 1 x daily - 7 x weekly - 3-4 sets - 5 reps  ASSESSMENT:  CLINICAL IMPRESSION: Patient presents to PT reporting moderate pain in her Rt knee, she also states she is more tired overall today. Session today continued to focus on LE strengthening and stretching. Towards end of session patient began to feel lightheaded, dizzy, and hot. She states she didn't eat very much today and that her blood sugar may be dropping, she does not have her glucose monitor with her to test. Provided patient with water and cookies and rest break. She departs session reporting that she feels better, does not have any more symptoms. Patient continues to benefit from skilled PT services and should be progressed as able to improve functional independence.   EVAL: Patient is a 33 y.o. F who was seen today for physical therapy evaluation and treatment for bilateral knee pain and discomfort. Physical findings are consistent with injury/surgical hx along with bilateral LE weakness particularly in lateral hip and quad. Her LEFS score shows moderate disability in performance of home ADLs and community level activities. Pt would benefit from skilled PT services with specific focus on LE strengthening for improving movement and decreasing knee pain.    OBJECTIVE IMPAIRMENTS: Abnormal gait, decreased activity tolerance, decreased mobility, difficulty walking, decreased ROM, decreased strength, and pain  ACTIVITY LIMITATIONS: carrying, lifting, sitting, standing, squatting, stairs, transfers, and locomotion level  PARTICIPATION LIMITATIONS: meal prep, cleaning, driving, shopping, community activity, and yard work  PERSONAL FACTORS: Time since onset of injury/illness/exacerbation and 3+ comorbidities: Bipolar disorder, Depression, intellectual disability, bilateral ACL reconstruction  are also affecting patient's functional outcome.   REHAB POTENTIAL: Good  CLINICAL DECISION MAKING: Evolving/moderate complexity  EVALUATION COMPLEXITY: Moderate   GOALS: Goals reviewed with patient? No  SHORT TERM GOALS: Target date: 03/25/2024   Pt will be compliant and knowledgeable with initial HEP for improved comfort and carryover Baseline: initial HEP given  Goal status: MET Pt reports compliance 03/26/24  2.  Pt will self report bilateral knee pain no greater than 7/10 for improved comfort and functional ability Baseline: 10/10 at worst Goal status: Ongoing   LONG TERM GOALS: Target date: 04/29/2024   Pt will improve LEFS to no less than 50/80 as proxy for functional improvement with home ADLs and higher level community activity Baseline: 34/80 Goal status: INITIAL   2.  Pt will self report bilateral knee pain no greater than 3/10 for improved comfort and functional ability Baseline: 10/10 at worst Goal status: INITIAL   3.  Pt will increase 30 Second Sit to Stand rep count to no less than 12 reps for improved balance, strength, and functional mobility Baseline: 8 reps  Goal status: INITIAL   4.  Pt will improve all LE MMT to no less than 4/5 for all tested motions for improved knee stabilization and decreased pain Baseline: see MMT chart Goal status: INITIAL  5.  Pt will be able to squat with hands to floor without increase in  bilateral knee pain for improved comfort and functional ability  Baseline: unable  Goal status: INITIAL   PLAN:  PT FREQUENCY: 2x/week  PT DURATION: 8 weeks  PLANNED INTERVENTIONS: 97164- PT Re-evaluation, 97110-Therapeutic exercises, 97530- Therapeutic activity, [id number]- Neuromuscular re-education, 97535- Self Care, [id number]- Manual therapy, [id number]- Gait training, [phone]- Electrical stimulation (unattended), [id number]- Electrical stimulation (manual), 97016- Vasopneumatic device, Cryotherapy, and Moist heat  PLAN FOR NEXT SESSION: assess HEP response, quad and hip strengthening, progress functional lifting movements    Corean Pouch, PTA 03/26/2024, 12:51 PM

## 2024-03-30 DIAGNOSIS — Z5181 Encounter for therapeutic drug level monitoring: Secondary | ICD-10-CM | POA: Diagnosis not present

## 2024-03-31 ENCOUNTER — Ambulatory Visit

## 2024-03-31 DIAGNOSIS — G8929 Other chronic pain: Secondary | ICD-10-CM | POA: Diagnosis not present

## 2024-03-31 DIAGNOSIS — R2689 Other abnormalities of gait and mobility: Secondary | ICD-10-CM | POA: Diagnosis not present

## 2024-03-31 DIAGNOSIS — M25562 Pain in left knee: Secondary | ICD-10-CM | POA: Diagnosis not present

## 2024-03-31 DIAGNOSIS — M6281 Muscle weakness (generalized): Secondary | ICD-10-CM

## 2024-03-31 DIAGNOSIS — M25561 Pain in right knee: Secondary | ICD-10-CM | POA: Diagnosis not present

## 2024-03-31 NOTE — Therapy (Signed)
 OUTPATIENT PHYSICAL THERAPY TREATMENT NOTE   Patient Name: SEATTLE DALPORTO MRN: [id number] DOB:[birth year], 33 y.o., female Today's Date: 03/31/2024  END OF SESSION:  PT End of Session - 03/31/24 1258     Visit Number 7    Number of Visits 17    Date for PT Re-Evaluation 04/29/24    Authorization Type [employer] Dual Complete    PT Start Time 1301    PT Stop Time 1341    PT Time Calculation (min) 40 min    Activity Tolerance Patient tolerated treatment well    Behavior During Therapy WFL for tasks assessed/performed            Past Medical History:  Diagnosis Date   Arthritis    Bipolar 1 disorder (HCC)    Bowel incontinence    Depression    Diabetes mellitus without complication (HCC)    Headache    migraines   Mental disability    Sleep apnea    Past Surgical History:  Procedure Laterality Date   ANTERIOR CRUCIATE LIGAMENT REPAIR Right 05/11/2022   Procedure: RIGHT KNEE ARTHROSCOPY; LATERAL MENISECTOMY; ANTERIOR CRUCIATE LIGAMENT (ACL) REPAIR; CHONDROPLASTY;  Surgeon: Shari Sieving, MD;  Location: [facility];  Service: Orthopedics;  Laterality: Right;   FOOT SURGERY     KNEE ARTHROSCOPY W/ ACL RECONSTRUCTION Left 10/12/2020   TOOTH EXTRACTION     Patient Active Problem List   Diagnosis Date Noted   Pain in left elbow 07/27/2020   Prediabetes 02/03/2019   Obstructive sleep apnea 04/23/2018   Other fatigue 11/26/2017   Shortness of breath on exertion 11/26/2017   Type 2 diabetes mellitus without complication, without long-term current use of insulin  (HCC) 11/26/2017   Other hyperlipidemia 11/26/2017   Vitamin D  deficiency 11/26/2017   Schizoaffective disorder, bipolar type (HCC) 09/14/2014   Intellectual disability 09/10/2014    PCP: Leigh Lung, MD  REFERRING PROVIDER: Shari Sieving, MD  REFERRING DIAG: Hx of ACL Reconstruction   THERAPY DIAG:  Chronic pain of left knee  Chronic pain of right knee  Muscle weakness (generalized)  Rationale for  Evaluation and Treatment: Rehabilitation  ONSET DATE: Chronic  SUBJECTIVE:   SUBJECTIVE STATEMENT: Pt presents to PT with continued pain in bilateral knee, R>L. Notes 3/10 pain today.   EVAL: Pt presents to PT with reports of chronic bilateral knee pain in presence of previous ACL repairs. She is well known to therapist and has been seen for both ACL recoveries in past. She currently has significant pain when standing for long periods of time, or when walking/squatting. Denies referral of symptoms past knee, pain is sharp and along bilateral knee joint lines.   PERTINENT HISTORY: Bipolar disorder, Depression, intellectual disability, bilateral ACL reconstruction   PAIN:  Are you having pain?  Yes: NPRS scale: 7/10 Worst: 10/10 Pain location: bilateral inferior knee Pain description: sharp, sore Aggravating factors: standing, stairs,  Relieving factors: rest  PRECAUTIONS: None  RED FLAGS: None   WEIGHT BEARING RESTRICTIONS: No  FALLS:  Has patient fallen in last 6 months? -   LIVING ENVIRONMENT: Lives with: lives with their family Lives in: House/apartment Stairs: 4 STE - handrail  Has following equipment at home: None  OCCUPATION: Disability  PLOF: Independent with basic ADLs  PATIENT GOALS: decrease knee pain, improve strength and be able to work out  NEXT MD VISIT: PRN  OBJECTIVE:  Note: Objective measures were completed at Evaluation unless otherwise noted.  DIAGNOSTIC FINDINGS: N/A  PATIENT SURVEYS:  LEFS: 34/80  COGNITION:  Overall cognitive status: Within functional limits for tasks assessed     SENSATION: WFL  POSTURE: rounded shoulders, forward head, increased lumbar lordosis, and large body habitus  PALPATION: TTP to distal quad  LOWER EXTREMITY ROM:  Active ROM Right eval Left eval  Hip flexion    Hip extension    Hip abduction    Hip adduction    Hip internal rotation    Hip external rotation    Knee flexion 114 118  Knee  extension 0 5  Ankle dorsiflexion    Ankle plantarflexion    Ankle inversion    Ankle eversion     (Blank rows = not tested)  LOWER EXTREMITY MMT:  MMT Right eval Left eval  Hip flexion 3+ 3+  Hip extension    Hip abduction 3+ 3+  Hip adduction    Hip internal rotation    Hip external rotation    Knee flexion 3+ 3+  Knee extension 3+ 3+  Ankle dorsiflexion    Ankle plantarflexion    Ankle inversion    Ankle eversion     (Blank rows = not tested)  LOWER EXTREMITY SPECIAL TESTS:  Knee special tests: Lachman Test: negative  FUNCTIONAL TESTS:  30 Second Sit to Stand: 8 reps  GAIT: Distance walked: 47ft Assistive device utilized: None Level of assistance: Complete Independence Comments: decreased gait speed, antalgia L   TREATMENT: [facility] Adult PT Treatment:                                                DATE: 03/31/24 Bike level 3 x 3 mins while gathering subjective and planning session with patient Standing hip abd 2x10 30# TKE x 10 red power cord  Omega knee extension 25# 2x10 BIL Omega knee flexion 45# 2x10 TRX squats x 10 Slant board calf stretch 2x30 STS 2x10 15# KB  [facility] Adult PT Treatment:                                                DATE: 03/26/24 Therapeutic Exercise: Bike level 3 x 5 mins while gathering subjective and planning session with patient Slant board calf stretch 2x1' Seated hamstring stretch 2x30 BIL SLR x10 RLE Omega knee extension 25# 2x10 BIL  Omega knee flexion 35# 2x10 Heel raises 2x10  [facility] Adult PT Treatment:                                                DATE: 03/25/24 Therapeutic Exercise: Bike level 3 x 5 mins while gathering subjective and planning session with patient Slant board calf stretch 2x1' Seated hamstring stretch 2x30 BIL SLR x10 BIL Omega knee extension 25# 2x10 BIL  Omega knee flexion 35# 2x10 Recumbent leg press seat 9, 80# x3 (painful today in Lt knee)  [facility] Adult PT Treatment:  DATE: 03/18/24 Therapeutic Exercise: Bike level 3 x 5 mins while gathering subjective and planning session with patient Slant board calf stretch 2x1' Seated hamstring stretch 2x30 BIL SLR 2x10 Therapeutic Activity: Sidestepping RTB at knees x5 laps at [employer] walks fwd/bwd RTB at knees 2 laps x20 ft. Runners step up 8 x10 BIL   PATIENT EDUCATION:  Education details: eval findings, LEFS, HEP, POC Person educated: Patient Education method: Explanation, Demonstration, and Handouts Education comprehension: verbalized understanding and returned demonstration  HOME EXERCISE PROGRAM: Access Code: [email or web address] URL: [email or web address] Date: 03/04/2024 Prepared by: Alm Kingdom  Exercises - Supine Heel Slide  - 1 x daily - 7 x weekly - 2 sets - 10 reps - 5 sec hold - Supine Quadricep Sets  - 1 x daily - 7 x weekly - 2 sets - 10 reps - 5 sec hold - Active Straight Leg Raise with Quad Set  - 1 x daily - 7 x weekly - 3-4 sets - 5 reps - Sidelying Hip Abduction  - 1 x daily - 7 x weekly - 3-4 sets - 5 reps  ASSESSMENT:  CLINICAL IMPRESSION: Pt was able to complete prescribed exercises with no adverse effect. Today we continued to progress quad and proximal hip strength with terminal knee ext and TRX squats. Overall she is progressing quite well with therapy. We will continue to progress per POC as prescribed.   EVAL: Patient is a 33 y.o. F who was seen today for physical therapy evaluation and treatment for bilateral knee pain and discomfort. Physical findings are consistent with injury/surgical hx along with bilateral LE weakness particularly in lateral hip and quad. Her LEFS score shows moderate disability in performance of home ADLs and community level activities. Pt would benefit from skilled PT services with specific focus on LE strengthening for improving movement and decreasing knee pain.   OBJECTIVE IMPAIRMENTS: Abnormal gait, decreased activity  tolerance, decreased mobility, difficulty walking, decreased ROM, decreased strength, and pain  ACTIVITY LIMITATIONS: carrying, lifting, sitting, standing, squatting, stairs, transfers, and locomotion level  PARTICIPATION LIMITATIONS: meal prep, cleaning, driving, shopping, community activity, and yard work  PERSONAL FACTORS: Time since onset of injury/illness/exacerbation and 3+ comorbidities: Bipolar disorder, Depression, intellectual disability, bilateral ACL reconstruction  are also affecting patient's functional outcome.   REHAB POTENTIAL: Good  CLINICAL DECISION MAKING: Evolving/moderate complexity  EVALUATION COMPLEXITY: Moderate   GOALS: Goals reviewed with patient? No  SHORT TERM GOALS: Target date: 03/25/2024   Pt will be compliant and knowledgeable with initial HEP for improved comfort and carryover Baseline: initial HEP given  Goal status: MET Pt reports compliance 03/26/24  2.  Pt will self report bilateral knee pain no greater than 7/10 for improved comfort and functional ability Baseline: 10/10 at worst Goal status: Ongoing   LONG TERM GOALS: Target date: 04/29/2024   Pt will improve LEFS to no less than 50/80 as proxy for functional improvement with home ADLs and higher level community activity Baseline: 34/80 Goal status: INITIAL   2.  Pt will self report bilateral knee pain no greater than 3/10 for improved comfort and functional ability Baseline: 10/10 at worst Goal status: INITIAL   3.  Pt will increase 30 Second Sit to Stand rep count to no less than 12 reps for improved balance, strength, and functional mobility Baseline: 8 reps  Goal status: INITIAL   4.  Pt will improve all LE MMT to no less than 4/5 for all tested motions for improved knee stabilization and decreased pain Baseline:  see MMT chart Goal status: INITIAL  5.  Pt will be able to squat with hands to floor without increase in bilateral knee pain for improved comfort and functional ability   Baseline: unable Goal status: INITIAL   PLAN:  PT FREQUENCY: 2x/week  PT DURATION: 8 weeks  PLANNED INTERVENTIONS: 97164- PT Re-evaluation, 97110-Therapeutic exercises, 97530- Therapeutic activity, 97112- Neuromuscular re-education, 97535- Self Care, [id number]- Manual therapy, [id number]- Gait training, [id number]- Electrical stimulation (unattended), [id number]- Electrical stimulation (manual), 97016- Vasopneumatic device, Cryotherapy, and Moist heat  PLAN FOR NEXT SESSION: assess HEP response, quad and hip strengthening, progress functional lifting movements    Alm JAYSON Kingdom, PT 03/31/2024, 2:13 PM

## 2024-04-02 ENCOUNTER — Ambulatory Visit

## 2024-04-02 DIAGNOSIS — M6281 Muscle weakness (generalized): Secondary | ICD-10-CM | POA: Diagnosis not present

## 2024-04-02 DIAGNOSIS — R2689 Other abnormalities of gait and mobility: Secondary | ICD-10-CM | POA: Diagnosis not present

## 2024-04-02 DIAGNOSIS — M25562 Pain in left knee: Secondary | ICD-10-CM | POA: Diagnosis not present

## 2024-04-02 DIAGNOSIS — G8929 Other chronic pain: Secondary | ICD-10-CM

## 2024-04-02 DIAGNOSIS — M25561 Pain in right knee: Secondary | ICD-10-CM | POA: Diagnosis not present

## 2024-04-02 NOTE — Therapy (Signed)
 OUTPATIENT PHYSICAL THERAPY TREATMENT NOTE   Patient Name: Cindy Phillips MRN: [id number] DOB:1990/11/28, 33 y.o., female Today's Date: 04/02/2024  END OF SESSION:  PT End of Session - 04/02/24 1350     Visit Number 8    Number of Visits 17    Date for PT Re-Evaluation 04/29/24    Authorization Type [employer] Dual Complete    PT Start Time 1355    PT Stop Time 1435    PT Time Calculation (min) 40 min    Activity Tolerance Patient tolerated treatment well    Behavior During Therapy WFL for tasks assessed/performed            Past Medical History:  Diagnosis Date   Arthritis    Bipolar 1 disorder (HCC)    Bowel incontinence    Depression    Diabetes mellitus without complication (HCC)    Headache    migraines   Mental disability    Sleep apnea    Past Surgical History:  Procedure Laterality Date   ANTERIOR CRUCIATE LIGAMENT REPAIR Right 05/11/2022   Procedure: RIGHT KNEE ARTHROSCOPY; LATERAL MENISECTOMY; ANTERIOR CRUCIATE LIGAMENT (ACL) REPAIR; CHONDROPLASTY;  Surgeon: Shari Sieving, MD;  Location: [facility];  Service: Orthopedics;  Laterality: Right;   FOOT SURGERY     KNEE ARTHROSCOPY W/ ACL RECONSTRUCTION Left 10/12/2020   TOOTH EXTRACTION     Patient Active Problem List   Diagnosis Date Noted   Pain in left elbow 07/27/2020   Prediabetes 02/03/2019   Obstructive sleep apnea 04/23/2018   Other fatigue 11/26/2017   Shortness of breath on exertion 11/26/2017   Type 2 diabetes mellitus without complication, without long-term current use of insulin  (HCC) 11/26/2017   Other hyperlipidemia 11/26/2017   Vitamin D  deficiency 11/26/2017   Schizoaffective disorder, bipolar type (HCC) 09/14/2014   Intellectual disability 09/10/2014    PCP: Leigh Lung, MD  REFERRING PROVIDER: Shari Sieving, MD  REFERRING DIAG: Hx of ACL Reconstruction   THERAPY DIAG:  Chronic pain of left knee  Chronic pain of right knee  Muscle weakness (generalized)  Rationale for  Evaluation and Treatment: Rehabilitation  ONSET DATE: Chronic  SUBJECTIVE:   SUBJECTIVE STATEMENT: Pt presents to PT with reports of 7/10 knee pain. Has been compliant with HEP.   EVAL: Pt presents to PT with reports of chronic bilateral knee pain in presence of previous ACL repairs. She is well known to therapist and has been seen for both ACL recoveries in past. She currently has significant pain when standing for long periods of time, or when walking/squatting. Denies referral of symptoms past knee, pain is sharp and along bilateral knee joint lines.   PERTINENT HISTORY: Bipolar disorder, Depression, intellectual disability, bilateral ACL reconstruction   PAIN:  Are you having pain?  Yes: NPRS scale: 7/10 Worst: 10/10 Pain location: bilateral inferior knee Pain description: sharp, sore Aggravating factors: standing, stairs,  Relieving factors: rest  PRECAUTIONS: None  RED FLAGS: None   WEIGHT BEARING RESTRICTIONS: No  FALLS:  Has patient fallen in last 6 months? -   LIVING ENVIRONMENT: Lives with: lives with their family Lives in: House/apartment Stairs: 4 STE - handrail  Has following equipment at home: None  OCCUPATION: Disability  PLOF: Independent with basic ADLs  PATIENT GOALS: decrease knee pain, improve strength and be able to work out  NEXT MD VISIT: PRN  OBJECTIVE:  Note: Objective measures were completed at Evaluation unless otherwise noted.  DIAGNOSTIC FINDINGS: N/A  PATIENT SURVEYS:  LEFS: 34/80  COGNITION:  Overall cognitive status: Within functional limits for tasks assessed     SENSATION: WFL  POSTURE: rounded shoulders, forward head, increased lumbar lordosis, and large body habitus  PALPATION: TTP to distal quad  LOWER EXTREMITY ROM:  Active ROM Right eval Left eval  Hip flexion    Hip extension    Hip abduction    Hip adduction    Hip internal rotation    Hip external rotation    Knee flexion 114 118  Knee extension 0 5   Ankle dorsiflexion    Ankle plantarflexion    Ankle inversion    Ankle eversion     (Blank rows = not tested)  LOWER EXTREMITY MMT:  MMT Right eval Left eval  Hip flexion 3+ 3+  Hip extension    Hip abduction 3+ 3+  Hip adduction    Hip internal rotation    Hip external rotation    Knee flexion 3+ 3+  Knee extension 3+ 3+  Ankle dorsiflexion    Ankle plantarflexion    Ankle inversion    Ankle eversion     (Blank rows = not tested)  LOWER EXTREMITY SPECIAL TESTS:  Knee special tests: Lachman Test: negative  FUNCTIONAL TESTS:  30 Second Sit to Stand: 8 reps  GAIT: Distance walked: 1ft Assistive device utilized: None Level of assistance: Complete Independence Comments: decreased gait speed, antalgia L   TREATMENT: [facility] Adult PT Treatment:                                                DATE: 04/02/24 Bike level 3 x 4 mins while gathering subjective and planning session with patient Standing hip abd 2x10 37.5# Omega knee extension 25# 2x10 BIL Omega knee flexion 45# 2x10 Step ups 8in each x 15 Lateral walk RTB in // x 4 laps TRX squats x 10 - quick fatigue  [facility] Adult PT Treatment:                                                DATE: 03/31/24 Bike level 3 x 3 mins while gathering subjective and planning session with patient Standing hip abd 2x10 30# TKE x 10 red power cord  Omega knee extension 25# 2x10 BIL Omega knee flexion 45# 2x10 TRX squats x 10 Slant board calf stretch 2x30 STS 2x10 15# KB  [facility] Adult PT Treatment:                                                DATE: 03/26/24 Therapeutic Exercise: Bike level 3 x 5 mins while gathering subjective and planning session with patient Slant board calf stretch 2x1' Seated hamstring stretch 2x30 BIL SLR x10 RLE Omega knee extension 25# 2x10 BIL  Omega knee flexion 35# 2x10 Heel raises 2x10  [facility] Adult PT Treatment:                                                DATE: 03/25/24  Therapeutic Exercise: Bike  level 3 x 5 mins while gathering subjective and planning session with patient Slant board calf stretch 2x1' Seated hamstring stretch 2x30 BIL SLR x10 BIL Omega knee extension 25# 2x10 BIL  Omega knee flexion 35# 2x10 Recumbent leg press seat 9, 80# x3 (painful today in Lt knee)  [facility] Adult PT Treatment:                                                DATE: 03/18/24 Therapeutic Exercise: Bike level 3 x 5 mins while gathering subjective and planning session with patient Slant board calf stretch 2x1' Seated hamstring stretch 2x30 BIL SLR 2x10 Therapeutic Activity: Sidestepping RTB at knees x5 laps at [employer] walks fwd/bwd RTB at knees 2 laps x20 ft. Runners step up 8 x10 BIL   PATIENT EDUCATION:  Education details: eval findings, LEFS, HEP, POC Person educated: Patient Education method: Explanation, Demonstration, and Handouts Education comprehension: verbalized understanding and returned demonstration  HOME EXERCISE PROGRAM: Access Code: [email or web address] URL: [email or web address] Date: 03/04/2024 Prepared by: Alm Kingdom  Exercises - Supine Heel Slide  - 1 x daily - 7 x weekly - 2 sets - 10 reps - 5 sec hold - Supine Quadricep Sets  - 1 x daily - 7 x weekly - 2 sets - 10 reps - 5 sec hold - Active Straight Leg Raise with Quad Set  - 1 x daily - 7 x weekly - 3-4 sets - 5 reps - Sidelying Hip Abduction  - 1 x daily - 7 x weekly - 3-4 sets - 5 reps  ASSESSMENT:  CLINICAL IMPRESSION: Pt was able to complete prescribed exercises with no adverse effect. Today we continued to progress quad and proximal hip strength. Overall she is progressing quite well with therapy. We will continue to progress per POC as prescribed.   EVAL: Patient is a 33 y.o. F who was seen today for physical therapy evaluation and treatment for bilateral knee pain and discomfort. Physical findings are consistent with injury/surgical hx along with bilateral LE weakness particularly in lateral  hip and quad. Her LEFS score shows moderate disability in performance of home ADLs and community level activities. Pt would benefit from skilled PT services with specific focus on LE strengthening for improving movement and decreasing knee pain.   OBJECTIVE IMPAIRMENTS: Abnormal gait, decreased activity tolerance, decreased mobility, difficulty walking, decreased ROM, decreased strength, and pain  ACTIVITY LIMITATIONS: carrying, lifting, sitting, standing, squatting, stairs, transfers, and locomotion level  PARTICIPATION LIMITATIONS: meal prep, cleaning, driving, shopping, community activity, and yard work  PERSONAL FACTORS: Time since onset of injury/illness/exacerbation and 3+ comorbidities: Bipolar disorder, Depression, intellectual disability, bilateral ACL reconstruction  are also affecting patient's functional outcome.   REHAB POTENTIAL: Good  CLINICAL DECISION MAKING: Evolving/moderate complexity  EVALUATION COMPLEXITY: Moderate   GOALS: Goals reviewed with patient? No  SHORT TERM GOALS: Target date: 03/25/2024   Pt will be compliant and knowledgeable with initial HEP for improved comfort and carryover Baseline: initial HEP given  Goal status: MET Pt reports compliance 03/26/24  2.  Pt will self report bilateral knee pain no greater than 7/10 for improved comfort and functional ability Baseline: 10/10 at worst Goal status: Ongoing   LONG TERM GOALS: Target date: 04/29/2024   Pt will improve LEFS to no less than 50/80 as proxy for functional improvement with home ADLs  and higher level community activity Baseline: 34/80 Goal status: INITIAL   2.  Pt will self report bilateral knee pain no greater than 3/10 for improved comfort and functional ability Baseline: 10/10 at worst Goal status: INITIAL   3.  Pt will increase 30 Second Sit to Stand rep count to no less than 12 reps for improved balance, strength, and functional mobility Baseline: 8 reps  Goal status: INITIAL   4.   Pt will improve all LE MMT to no less than 4/5 for all tested motions for improved knee stabilization and decreased pain Baseline: see MMT chart Goal status: INITIAL  5.  Pt will be able to squat with hands to floor without increase in bilateral knee pain for improved comfort and functional ability  Baseline: unable Goal status: INITIAL   PLAN:  PT FREQUENCY: 2x/week  PT DURATION: 8 weeks  PLANNED INTERVENTIONS: 97164- PT Re-evaluation, 97110-Therapeutic exercises, 97530- Therapeutic activity, 97112- Neuromuscular re-education, 97535- Self Care, [id number]- Manual therapy, [id number]- Gait training, [id number]- Electrical stimulation (unattended), [id number]- Electrical stimulation (manual), 97016- Vasopneumatic device, Cryotherapy, and Moist heat  PLAN FOR NEXT SESSION: assess HEP response, quad and hip strengthening, progress functional lifting movements    Alm JAYSON Kingdom, PT 04/02/2024, 3:35 PM

## 2024-04-07 ENCOUNTER — Other Ambulatory Visit (HOSPITAL_COMMUNITY): Admit: 2024-04-07

## 2024-04-07 ENCOUNTER — Other Ambulatory Visit (HOSPITAL_COMMUNITY)
Admission: RE | Admit: 2024-04-07 | Discharge: 2024-04-07 | Disposition: A | Discharge status: Home / Self Care | Source: Ambulatory Visit | Attending: Family Medicine | Admitting: Family Medicine

## 2024-04-07 ENCOUNTER — Other Ambulatory Visit: Payer: Self-pay | Admitting: Family Medicine

## 2024-04-07 DIAGNOSIS — Z112 Encounter for screening for other bacterial diseases: Secondary | ICD-10-CM | POA: Diagnosis not present

## 2024-04-07 DIAGNOSIS — Z124 Encounter for screening for malignant neoplasm of cervix: Secondary | ICD-10-CM | POA: Insufficient documentation

## 2024-04-07 DIAGNOSIS — Z0000 Encounter for general adult medical examination without abnormal findings: Secondary | ICD-10-CM | POA: Diagnosis not present

## 2024-04-07 DIAGNOSIS — E1169 Type 2 diabetes mellitus with other specified complication: Secondary | ICD-10-CM | POA: Diagnosis not present

## 2024-04-08 ENCOUNTER — Ambulatory Visit

## 2024-04-08 DIAGNOSIS — M25561 Pain in right knee: Secondary | ICD-10-CM | POA: Diagnosis not present

## 2024-04-08 DIAGNOSIS — M6281 Muscle weakness (generalized): Secondary | ICD-10-CM

## 2024-04-08 DIAGNOSIS — M25562 Pain in left knee: Secondary | ICD-10-CM | POA: Diagnosis not present

## 2024-04-08 DIAGNOSIS — G8929 Other chronic pain: Secondary | ICD-10-CM | POA: Diagnosis not present

## 2024-04-08 DIAGNOSIS — R2689 Other abnormalities of gait and mobility: Secondary | ICD-10-CM | POA: Diagnosis not present

## 2024-04-08 NOTE — Therapy (Signed)
 OUTPATIENT PHYSICAL THERAPY TREATMENT NOTE   Patient Name: Cindy Phillips MRN: [id number] DOB:[birth year], 33 y.o., female Today's Date: 04/08/2024  END OF SESSION:  PT End of Session - 04/08/24 1429     Visit Number 9    Number of Visits 17    Date for PT Re-Evaluation 04/29/24    Authorization Type [employer] Dual Complete    PT Start Time 1445    PT Stop Time 1523    PT Time Calculation (min) 38 min    Activity Tolerance Patient tolerated treatment well    Behavior During Therapy WFL for tasks assessed/performed             Past Medical History:  Diagnosis Date   Arthritis    Bipolar 1 disorder (HCC)    Bowel incontinence    Depression    Diabetes mellitus without complication (HCC)    Headache    migraines   Mental disability    Sleep apnea    Past Surgical History:  Procedure Laterality Date   ANTERIOR CRUCIATE LIGAMENT REPAIR Right 05/11/2022   Procedure: RIGHT KNEE ARTHROSCOPY; LATERAL MENISECTOMY; ANTERIOR CRUCIATE LIGAMENT (ACL) REPAIR; CHONDROPLASTY;  Surgeon: Shari Sieving, MD;  Location: [facility];  Service: Orthopedics;  Laterality: Right;   FOOT SURGERY     KNEE ARTHROSCOPY W/ ACL RECONSTRUCTION Left 10/12/2020   TOOTH EXTRACTION     Patient Active Problem List   Diagnosis Date Noted   Pain in left elbow 07/27/2020   Prediabetes 02/03/2019   Obstructive sleep apnea 04/23/2018   Other fatigue 11/26/2017   Shortness of breath on exertion 11/26/2017   Type 2 diabetes mellitus without complication, without long-term current use of insulin  (HCC) 11/26/2017   Other hyperlipidemia 11/26/2017   Vitamin D  deficiency 11/26/2017   Schizoaffective disorder, bipolar type (HCC) 09/14/2014   Intellectual disability 09/10/2014    PCP: Leigh Lung, MD  REFERRING PROVIDER: Shari Sieving, MD  REFERRING DIAG: Hx of ACL Reconstruction   THERAPY DIAG:  Chronic pain of left knee  Chronic pain of right knee  Muscle weakness (generalized)  Rationale for  Evaluation and Treatment: Rehabilitation  ONSET DATE: Chronic  SUBJECTIVE:   SUBJECTIVE STATEMENT: Pt presents to PT with reports of no current knee pain.   EVAL: Pt presents to PT with reports of chronic bilateral knee pain in presence of previous ACL repairs. She is well known to therapist and has been seen for both ACL recoveries in past. She currently has significant pain when standing for long periods of time, or when walking/squatting. Denies referral of symptoms past knee, pain is sharp and along bilateral knee joint lines.   PERTINENT HISTORY: Bipolar disorder, Depression, intellectual disability, bilateral ACL reconstruction   PAIN:  Are you having pain?  Yes: NPRS scale: 7/10 Worst: 10/10 Pain location: bilateral inferior knee Pain description: sharp, sore Aggravating factors: standing, stairs,  Relieving factors: rest  PRECAUTIONS: None  RED FLAGS: None   WEIGHT BEARING RESTRICTIONS: No  FALLS:  Has patient fallen in last 6 months? -   LIVING ENVIRONMENT: Lives with: lives with their family Lives in: House/apartment Stairs: 4 STE - handrail  Has following equipment at home: None  OCCUPATION: Disability  PLOF: Independent with basic ADLs  PATIENT GOALS: decrease knee pain, improve strength and be able to work out  NEXT MD VISIT: PRN  OBJECTIVE:  Note: Objective measures were completed at Evaluation unless otherwise noted.  DIAGNOSTIC FINDINGS: N/A  PATIENT SURVEYS:  LEFS: 34/80  COGNITION: Overall cognitive status:  Within functional limits for tasks assessed     SENSATION: WFL  POSTURE: rounded shoulders, forward head, increased lumbar lordosis, and large body habitus  PALPATION: TTP to distal quad  LOWER EXTREMITY ROM:  Active ROM Right eval Left eval  Hip flexion    Hip extension    Hip abduction    Hip adduction    Hip internal rotation    Hip external rotation    Knee flexion 114 118  Knee extension 0 5  Ankle dorsiflexion     Ankle plantarflexion    Ankle inversion    Ankle eversion     (Blank rows = not tested)  LOWER EXTREMITY MMT:  MMT Right eval Left eval  Hip flexion 3+ 3+  Hip extension    Hip abduction 3+ 3+  Hip adduction    Hip internal rotation    Hip external rotation    Knee flexion 3+ 3+  Knee extension 3+ 3+  Ankle dorsiflexion    Ankle plantarflexion    Ankle inversion    Ankle eversion     (Blank rows = not tested)  LOWER EXTREMITY SPECIAL TESTS:  Knee special tests: Lachman Test: negative  FUNCTIONAL TESTS: 30 Second Sit to Stand: 8 reps  GAIT: Distance walked: 55ft Assistive device utilized: None Level of assistance: Complete Independence Comments: decreased gait speed, antalgia L   TREATMENT: [facility] Adult PT Treatment:                                                DATE: 04/08/24 Bike level 3 x 4 mins while gathering subjective and planning session with patient Omega leg press 3x8 80# Standing hip abd 2x10 37.5# TRX squat x 5 Omega knee extension 25# 2x10 BIL Omega knee flexion 55# 2x10 Step ups 8in each x 15 with opp march  [facility] Adult PT Treatment:                                                DATE: 04/02/24 Bike level 3 x 4 mins while gathering subjective and planning session with patient Standing hip abd 2x10 37.5# Omega knee extension 25# 2x10 BIL Omega knee flexion 45# 2x10 Step ups 8in each x 15 Lateral walk RTB in // x 4 laps TRX squats x 10 - quick fatigue  [facility] Adult PT Treatment:                                                DATE: 03/31/24 Bike level 3 x 3 mins while gathering subjective and planning session with patient Standing hip abd 2x10 30# TKE x 10 red power cord  Omega knee extension 25# 2x10 BIL Omega knee flexion 45# 2x10 TRX squats x 10 Slant board calf stretch 2x30 STS 2x10 15# KB  [facility] Adult PT Treatment:                                                DATE: 03/26/24  Therapeutic Exercise: Bike level 3 x 5 mins while gathering  subjective and planning session with patient Slant board calf stretch 2x1' Seated hamstring stretch 2x30 BIL SLR x10 RLE Omega knee extension 25# 2x10 BIL  Omega knee flexion 35# 2x10 Heel raises 2x10  [facility] Adult PT Treatment:                                                DATE: 03/25/24 Therapeutic Exercise: Bike level 3 x 5 mins while gathering subjective and planning session with patient Slant board calf stretch 2x1' Seated hamstring stretch 2x30 BIL SLR x10 BIL Omega knee extension 25# 2x10 BIL  Omega knee flexion 35# 2x10 Recumbent leg press seat 9, 80# x3 (painful today in Lt knee)  [facility] Adult PT Treatment:                                                DATE: 03/18/24 Therapeutic Exercise: Bike level 3 x 5 mins while gathering subjective and planning session with patient Slant board calf stretch 2x1' Seated hamstring stretch 2x30 BIL SLR 2x10 Therapeutic Activity: Sidestepping RTB at knees x5 laps at [employer] walks fwd/bwd RTB at knees 2 laps x20 ft. Runners step up 8 x10 BIL   PATIENT EDUCATION:  Education details: eval findings, LEFS, HEP, POC Person educated: Patient Education method: Explanation, Demonstration, and Handouts Education comprehension: verbalized understanding and returned demonstration  HOME EXERCISE PROGRAM: Access Code: [email or web address] URL: [email or web address] Date: 03/04/2024 Prepared by: Alm Kingdom  Exercises - Supine Heel Slide  - 1 x daily - 7 x weekly - 2 sets - 10 reps - 5 sec hold - Supine Quadricep Sets  - 1 x daily - 7 x weekly - 2 sets - 10 reps - 5 sec hold - Active Straight Leg Raise with Quad Set  - 1 x daily - 7 x weekly - 3-4 sets - 5 reps - Sidelying Hip Abduction  - 1 x daily - 7 x weekly - 3-4 sets - 5 reps  ASSESSMENT:  CLINICAL IMPRESSION: Pt was able to complete prescribed exercises with no adverse effect. Today we continued to progress quad and proximal hip strength. Overall she is continuing to  progress quite well with therapy. We will continue to progress per POC as prescribed.   EVAL: Patient is a 33 y.o. F who was seen today for physical therapy evaluation and treatment for bilateral knee pain and discomfort. Physical findings are consistent with injury/surgical hx along with bilateral LE weakness particularly in lateral hip and quad. Her LEFS score shows moderate disability in performance of home ADLs and community level activities. Pt would benefit from skilled PT services with specific focus on LE strengthening for improving movement and decreasing knee pain.   OBJECTIVE IMPAIRMENTS: Abnormal gait, decreased activity tolerance, decreased mobility, difficulty walking, decreased ROM, decreased strength, and pain  ACTIVITY LIMITATIONS: carrying, lifting, sitting, standing, squatting, stairs, transfers, and locomotion level  PARTICIPATION LIMITATIONS: meal prep, cleaning, driving, shopping, community activity, and yard work  PERSONAL FACTORS: Time since onset of injury/illness/exacerbation and 3+ comorbidities: Bipolar disorder, Depression, intellectual disability, bilateral ACL reconstruction  are also affecting patient's functional outcome.   REHAB POTENTIAL: Good  CLINICAL DECISION MAKING: Evolving/moderate complexity  EVALUATION COMPLEXITY: Moderate   GOALS: Goals reviewed with patient? No  SHORT TERM GOALS: Target date: 03/25/2024   Pt will be compliant and knowledgeable with initial HEP for improved comfort and carryover Baseline: initial HEP given  Goal status: MET Pt reports compliance 03/26/24  2.  Pt will self report bilateral knee pain no greater than 7/10 for improved comfort and functional ability Baseline: 10/10 at worst Goal status: Ongoing   LONG TERM GOALS: Target date: 04/29/2024   Pt will improve LEFS to no less than 50/80 as proxy for functional improvement with home ADLs and higher level community activity Baseline: 34/80 Goal status: INITIAL   2.   Pt will self report bilateral knee pain no greater than 3/10 for improved comfort and functional ability Baseline: 10/10 at worst Goal status: INITIAL   3.  Pt will increase 30 Second Sit to Stand rep count to no less than 12 reps for improved balance, strength, and functional mobility Baseline: 8 reps  Goal status: INITIAL   4.  Pt will improve all LE MMT to no less than 4/5 for all tested motions for improved knee stabilization and decreased pain Baseline: see MMT chart Goal status: INITIAL  5.  Pt will be able to squat with hands to floor without increase in bilateral knee pain for improved comfort and functional ability  Baseline: unable Goal status: INITIAL   PLAN:  PT FREQUENCY: 2x/week  PT DURATION: 8 weeks  PLANNED INTERVENTIONS: 97164- PT Re-evaluation, 97110-Therapeutic exercises, 97530- Therapeutic activity, 97112- Neuromuscular re-education, 97535- Self Care, [id number]- Manual therapy, [id number]- Gait training, [id number]- Electrical stimulation (unattended), [id number]- Electrical stimulation (manual), 97016- Vasopneumatic device, Cryotherapy, and Moist heat  PLAN FOR NEXT SESSION: assess HEP response, quad and hip strengthening, progress functional lifting movements    Alm JAYSON Kingdom, PT 04/08/2024, 4:19 PM

## 2024-04-09 ENCOUNTER — Ambulatory Visit

## 2024-04-09 DIAGNOSIS — M6281 Muscle weakness (generalized): Secondary | ICD-10-CM | POA: Diagnosis not present

## 2024-04-09 DIAGNOSIS — G8929 Other chronic pain: Secondary | ICD-10-CM

## 2024-04-09 DIAGNOSIS — R2689 Other abnormalities of gait and mobility: Secondary | ICD-10-CM

## 2024-04-09 DIAGNOSIS — M25562 Pain in left knee: Secondary | ICD-10-CM | POA: Diagnosis not present

## 2024-04-09 DIAGNOSIS — M25561 Pain in right knee: Secondary | ICD-10-CM | POA: Diagnosis not present

## 2024-04-09 LAB — CYTOLOGY - PAP: Diagnosis: NEGATIVE

## 2024-04-09 NOTE — Therapy (Signed)
 OUTPATIENT PHYSICAL THERAPY TREATMENT/PROGRESS NOTE  Progress Note Reporting Period 03/04/2024 to 04/09/2024  See note below for Objective Data and Assessment of Progress/Goals.     Patient Name: Cindy Phillips MRN: [id number] DOB:09-08-1990, 33 y.o., female Today's Date: 04/10/2024  END OF SESSION:  PT End of Session - 04/09/24 1421     Visit Number 10    Number of Visits 17    Date for PT Re-Evaluation 04/29/24    Authorization Type [employer] Dual Complete    PT Start Time 1423    PT Stop Time 1503    PT Time Calculation (min) 40 min    Activity Tolerance Patient tolerated treatment well    Behavior During Therapy WFL for tasks assessed/performed              Past Medical History:  Diagnosis Date   Arthritis    Bipolar 1 disorder (HCC)    Bowel incontinence    Depression    Diabetes mellitus without complication (HCC)    Headache    migraines   Mental disability    Sleep apnea    Past Surgical History:  Procedure Laterality Date   ANTERIOR CRUCIATE LIGAMENT REPAIR Right 05/11/2022   Procedure: RIGHT KNEE ARTHROSCOPY; LATERAL MENISECTOMY; ANTERIOR CRUCIATE LIGAMENT (ACL) REPAIR; CHONDROPLASTY;  Surgeon: Shari Sieving, MD;  Location: [facility];  Service: Orthopedics;  Laterality: Right;   FOOT SURGERY     KNEE ARTHROSCOPY W/ ACL RECONSTRUCTION Left 10/12/2020   TOOTH EXTRACTION     Patient Active Problem List   Diagnosis Date Noted   Pain in left elbow 07/27/2020   Prediabetes 02/03/2019   Obstructive sleep apnea 04/23/2018   Other fatigue 11/26/2017   Shortness of breath on exertion 11/26/2017   Type 2 diabetes mellitus without complication, without long-term current use of insulin  (HCC) 11/26/2017   Other hyperlipidemia 11/26/2017   Vitamin D  deficiency 11/26/2017   Schizoaffective disorder, bipolar type (HCC) 09/14/2014   Intellectual disability 09/10/2014    PCP: Leigh Lung, MD  REFERRING PROVIDER: Shari Sieving, MD  REFERRING DIAG: Hx of ACL  Reconstruction   THERAPY DIAG:  Chronic pain of left knee  Chronic pain of right knee  Muscle weakness (generalized)  Other abnormalities of gait and mobility  Rationale for Evaluation and Treatment: Rehabilitation  ONSET DATE: Chronic  SUBJECTIVE:   SUBJECTIVE STATEMENT: Pt presents to PT with reports of 6/10   EVAL: Pt presents to PT with reports of chronic bilateral knee pain in presence of previous ACL repairs. She is well known to therapist and has been seen for both ACL recoveries in past. She currently has significant pain when standing for long periods of time, or when walking/squatting. Denies referral of symptoms past knee, pain is sharp and along bilateral knee joint lines.   PERTINENT HISTORY: Bipolar disorder, Depression, intellectual disability, bilateral ACL reconstruction   PAIN:  Are you having pain?  Yes: NPRS scale: 7/10 Worst: 10/10 Pain location: bilateral inferior knee Pain description: sharp, sore Aggravating factors: standing, stairs,  Relieving factors: rest  PRECAUTIONS: None  RED FLAGS: None   WEIGHT BEARING RESTRICTIONS: No  FALLS:  Has patient fallen in last 6 months? -   LIVING ENVIRONMENT: Lives with: lives with their family Lives in: House/apartment Stairs: 4 STE - handrail  Has following equipment at home: None  OCCUPATION: Disability  PLOF: Independent with basic ADLs  PATIENT GOALS: decrease knee pain, improve strength and be able to work out  NEXT MD VISIT: PRN  OBJECTIVE:  Note: Objective measures were completed at Evaluation unless otherwise noted.  DIAGNOSTIC FINDINGS: N/A  PATIENT SURVEYS:  LEFS: 34/80  COGNITION: Overall cognitive status: Within functional limits for tasks assessed     SENSATION: WFL  POSTURE: rounded shoulders, forward head, increased lumbar lordosis, and large body habitus  PALPATION: TTP to distal quad  LOWER EXTREMITY ROM:  Active ROM Right eval Left eval  Hip flexion     Hip extension    Hip abduction    Hip adduction    Hip internal rotation    Hip external rotation    Knee flexion 114 118  Knee extension 0 5  Ankle dorsiflexion    Ankle plantarflexion    Ankle inversion    Ankle eversion     (Blank rows = not tested)  LOWER EXTREMITY MMT:  MMT Right eval Left eval  Hip flexion 3+ 3+  Hip extension    Hip abduction 3+ 3+  Hip adduction    Hip internal rotation    Hip external rotation    Knee flexion 3+ 3+  Knee extension 3+ 3+  Ankle dorsiflexion    Ankle plantarflexion    Ankle inversion    Ankle eversion     (Blank rows = not tested)  LOWER EXTREMITY SPECIAL TESTS:  Knee special tests: Lachman Test: negative  FUNCTIONAL TESTS: 30 Second Sit to Stand: 8 reps 04/09/2024: 13 resps GAIT: Distance walked: 27ft Assistive device utilized: None Level of assistance: Complete Independence Comments: decreased gait speed, antalgia L   TREATMENT: [facility] Adult PT Treatment:                                                DATE: 04/09/24 Bike level 3 x 4 mins while gathering subjective and planning session with patient Omega leg press 3x8 80# Standing hip abd 2x10 42.5# TRX squat x 10 Omega knee extension 20# 2x10 BIL Lateral walk x 4 RTB  Lateral step down 2x10 4in ea Reassessment of tests/measures, goals, and outcomes  [facility] Adult PT Treatment:                                                DATE: 04/08/24 Bike level 3 x 4 mins while gathering subjective and planning session with patient Omega leg press 3x8 80# Standing hip abd 2x10 37.5# TRX squat x 5 Omega knee extension 25# 2x10 BIL Omega knee flexion 55# 2x10 Step ups 8in each x 15 with opp march  [facility] Adult PT Treatment:                                                DATE: 04/02/24 Bike level 3 x 4 mins while gathering subjective and planning session with patient Standing hip abd 2x10 37.5# Omega knee extension 25# 2x10 BIL Omega knee flexion 45# 2x10 Step ups 8in each x  15 Lateral walk RTB in // x 4 laps TRX squats x 10 - quick fatigue  [facility] Adult PT Treatment:  DATE: 03/31/24 Bike level 3 x 3 mins while gathering subjective and planning session with patient Standing hip abd 2x10 30# TKE x 10 red power cord  Omega knee extension 25# 2x10 BIL Omega knee flexion 45# 2x10 TRX squats x 10 Slant board calf stretch 2x30 STS 2x10 15# KB  [facility] Adult PT Treatment:                                                DATE: 03/26/24 Therapeutic Exercise: Bike level 3 x 5 mins while gathering subjective and planning session with patient Slant board calf stretch 2x1' Seated hamstring stretch 2x30 BIL SLR x10 RLE Omega knee extension 25# 2x10 BIL  Omega knee flexion 35# 2x10 Heel raises 2x10   PATIENT EDUCATION:  Education details: eval findings, LEFS, HEP, POC Person educated: Patient Education method: Explanation, Demonstration, and Handouts Education comprehension: verbalized understanding and returned demonstration  HOME EXERCISE PROGRAM: Access Code: [email or web address] URL: [email or web address] Date: 03/04/2024 Prepared by: Alm Kingdom  Exercises - Supine Heel Slide  - 1 x daily - 7 x weekly - 2 sets - 10 reps - 5 sec hold - Supine Quadricep Sets  - 1 x daily - 7 x weekly - 2 sets - 10 reps - 5 sec hold - Active Straight Leg Raise with Quad Set  - 1 x daily - 7 x weekly - 3-4 sets - 5 reps - Sidelying Hip Abduction  - 1 x daily - 7 x weekly - 3-4 sets - 5 reps  ASSESSMENT:  CLINICAL IMPRESSION: Pt was able to complete all prescribed exercises with no adverse effect. Over the course of PT treatment thus far she has improved subjectively and shows increased LE strength with met 30 Second Sit to Stand and improved squat form. She does continue to have bilateral knee pain but it is getting less. Pt continues to benefit from skilled PT services and will continue to be seen and progressed as able per POC.    EVAL: Patient is a 33 y.o. F who was seen today for physical therapy evaluation and treatment for bilateral knee pain and discomfort. Physical findings are consistent with injury/surgical hx along with bilateral LE weakness particularly in lateral hip and quad. Her LEFS score shows moderate disability in performance of home ADLs and community level activities. Pt would benefit from skilled PT services with specific focus on LE strengthening for improving movement and decreasing knee pain.   OBJECTIVE IMPAIRMENTS: Abnormal gait, decreased activity tolerance, decreased mobility, difficulty walking, decreased ROM, decreased strength, and pain  ACTIVITY LIMITATIONS: carrying, lifting, sitting, standing, squatting, stairs, transfers, and locomotion level  PARTICIPATION LIMITATIONS: meal prep, cleaning, driving, shopping, community activity, and yard work  PERSONAL FACTORS: Time since onset of injury/illness/exacerbation and 3+ comorbidities: Bipolar disorder, Depression, intellectual disability, bilateral ACL reconstruction  are also affecting patient's functional outcome.   REHAB POTENTIAL: Good  CLINICAL DECISION MAKING: Evolving/moderate complexity  EVALUATION COMPLEXITY: Moderate   GOALS: Goals reviewed with patient? No  SHORT TERM GOALS: Target date: 03/25/2024   Pt will be compliant and knowledgeable with initial HEP for improved comfort and carryover Baseline: initial HEP given  Goal status: MET Pt reports compliance 03/26/24  2.  Pt will self report bilateral knee pain no greater than 7/10 for improved comfort and functional ability Baseline: 10/10 at worst 04/09/2024: 6/10 Goal status: MET  LONG  TERM GOALS: Target date: 04/29/2024   Pt will improve LEFS to no less than 50/80 as proxy for functional improvement with home ADLs and higher level community activity Baseline: 34/80 04/09/2024: 36/80 Goal status: IN PROGRESS   2.  Pt will self report bilateral knee pain no greater  than 3/10 for improved comfort and functional ability Baseline: 10/10 at worst 04/09/2024: 6/10 Goal status: IN PROGRESS   3.  Pt will increase 30 Second Sit to Stand rep count to no less than 12 reps for improved balance, strength, and functional mobility Baseline: 8 reps 04/09/2024: 12 reps Goal status: MET   4.  Pt will improve all LE MMT to no less than 4/5 for all tested motions for improved knee stabilization and decreased pain Baseline: see MMT chart Goal status: INITIAL  5.  Pt will be able to squat with hands to floor without increase in bilateral knee pain for improved comfort and functional ability  Baseline: unable Goal status: INITIAL   PLAN:  PT FREQUENCY: 2x/week  PT DURATION: 8 weeks  PLANNED INTERVENTIONS: 97164- PT Re-evaluation, 97110-Therapeutic exercises, 97530- Therapeutic activity, 97112- Neuromuscular re-education, 97535- Self Care, [id number]- Manual therapy, [id number]- Gait training, [id number]- Electrical stimulation (unattended), [id number]- Electrical stimulation (manual), 97016- Vasopneumatic device, Cryotherapy, and Moist heat  PLAN FOR NEXT SESSION: assess HEP response, quad and hip strengthening, progress functional lifting movements    Alm JAYSON Kingdom, PT 04/10/2024, 8:06 AM

## 2024-04-13 ENCOUNTER — Ambulatory Visit

## 2024-04-13 DIAGNOSIS — M542 Cervicalgia: Secondary | ICD-10-CM | POA: Diagnosis not present

## 2024-04-13 DIAGNOSIS — G43719 Chronic migraine without aura, intractable, without status migrainosus: Secondary | ICD-10-CM | POA: Diagnosis not present

## 2024-04-13 DIAGNOSIS — M791 Myalgia, unspecified site: Secondary | ICD-10-CM | POA: Diagnosis not present

## 2024-04-15 ENCOUNTER — Ambulatory Visit

## 2024-04-15 DIAGNOSIS — M6281 Muscle weakness (generalized): Secondary | ICD-10-CM | POA: Diagnosis not present

## 2024-04-15 DIAGNOSIS — G8929 Other chronic pain: Secondary | ICD-10-CM

## 2024-04-15 DIAGNOSIS — M25561 Pain in right knee: Secondary | ICD-10-CM | POA: Diagnosis not present

## 2024-04-15 DIAGNOSIS — M25562 Pain in left knee: Secondary | ICD-10-CM | POA: Diagnosis not present

## 2024-04-15 DIAGNOSIS — R2689 Other abnormalities of gait and mobility: Secondary | ICD-10-CM | POA: Diagnosis not present

## 2024-04-15 NOTE — Therapy (Signed)
 OUTPATIENT PHYSICAL THERAPY TREATMENT    Patient Name: Cindy Phillips MRN: [id number] DOB:1991/06/02, 33 y.o., female Today's Date: 04/15/2024  END OF SESSION:  PT End of Session - 04/15/24 1327     Visit Number 11    Number of Visits 17    Date for PT Re-Evaluation 04/29/24    Authorization Type [employer] Dual Complete    PT Start Time 1345    PT Stop Time 1428    PT Time Calculation (min) 43 min    Activity Tolerance Patient tolerated treatment well    Behavior During Therapy WFL for tasks assessed/performed              Past Medical History:  Diagnosis Date   Arthritis    Bipolar 1 disorder (HCC)    Bowel incontinence    Depression    Diabetes mellitus without complication (HCC)    Headache    migraines   Mental disability    Sleep apnea    Past Surgical History:  Procedure Laterality Date   ANTERIOR CRUCIATE LIGAMENT REPAIR Right 05/11/2022   Procedure: RIGHT KNEE ARTHROSCOPY; LATERAL MENISECTOMY; ANTERIOR CRUCIATE LIGAMENT (ACL) REPAIR; CHONDROPLASTY;  Surgeon: Shari Sieving, MD;  Location: [facility];  Service: Orthopedics;  Laterality: Right;   FOOT SURGERY     KNEE ARTHROSCOPY W/ ACL RECONSTRUCTION Left 10/12/2020   TOOTH EXTRACTION     Patient Active Problem List   Diagnosis Date Noted   Pain in left elbow 07/27/2020   Prediabetes 02/03/2019   Obstructive sleep apnea 04/23/2018   Other fatigue 11/26/2017   Shortness of breath on exertion 11/26/2017   Type 2 diabetes mellitus without complication, without long-term current use of insulin  (HCC) 11/26/2017   Other hyperlipidemia 11/26/2017   Vitamin D  deficiency 11/26/2017   Schizoaffective disorder, bipolar type (HCC) 09/14/2014   Intellectual disability 09/10/2014    PCP: Leigh Lung, MD  REFERRING PROVIDER: Shari Sieving, MD  REFERRING DIAG: Hx of ACL Reconstruction   THERAPY DIAG:  Chronic pain of left knee  Chronic pain of right knee  Muscle weakness (generalized)  Rationale for  Evaluation and Treatment: Rehabilitation  ONSET DATE: Chronic  SUBJECTIVE:   SUBJECTIVE STATEMENT: Pt presents to PT stating that her knees are feeling better. Has been compliant with HEP.   EVAL: Pt presents to PT with reports of chronic bilateral knee pain in presence of previous ACL repairs. She is well known to therapist and has been seen for both ACL recoveries in past. She currently has significant pain when standing for long periods of time, or when walking/squatting. Denies referral of symptoms past knee, pain is sharp and along bilateral knee joint lines.   PERTINENT HISTORY: Bipolar disorder, Depression, intellectual disability, bilateral ACL reconstruction   PAIN:  Are you having pain?  Yes: NPRS scale: 7/10 Worst: 10/10 Pain location: bilateral inferior knee Pain description: sharp, sore Aggravating factors: standing, stairs,  Relieving factors: rest  PRECAUTIONS: None  RED FLAGS: None   WEIGHT BEARING RESTRICTIONS: No  FALLS:  Has patient fallen in last 6 months? -   LIVING ENVIRONMENT: Lives with: lives with their family Lives in: House/apartment Stairs: 4 STE - handrail  Has following equipment at home: None  OCCUPATION: Disability  PLOF: Independent with basic ADLs  PATIENT GOALS: decrease knee pain, improve strength and be able to work out  NEXT MD VISIT: PRN  OBJECTIVE:  Note: Objective measures were completed at Evaluation unless otherwise noted.  DIAGNOSTIC FINDINGS: N/A  PATIENT SURVEYS:  LEFS:  34/80  COGNITION: Overall cognitive status: Within functional limits for tasks assessed     SENSATION: WFL  POSTURE: rounded shoulders, forward head, increased lumbar lordosis, and large body habitus  PALPATION: TTP to distal quad  LOWER EXTREMITY ROM:  Active ROM Right eval Left eval  Hip flexion    Hip extension    Hip abduction    Hip adduction    Hip internal rotation    Hip external rotation    Knee flexion 114 118  Knee  extension 0 5  Ankle dorsiflexion    Ankle plantarflexion    Ankle inversion    Ankle eversion     (Blank rows = not tested)  LOWER EXTREMITY MMT:  MMT Right eval Left eval  Hip flexion 3+ 3+  Hip extension    Hip abduction 3+ 3+  Hip adduction    Hip internal rotation    Hip external rotation    Knee flexion 3+ 3+  Knee extension 3+ 3+  Ankle dorsiflexion    Ankle plantarflexion    Ankle inversion    Ankle eversion     (Blank rows = not tested)  LOWER EXTREMITY SPECIAL TESTS:  Knee special tests: Lachman Test: negative  FUNCTIONAL TESTS: 30 Second Sit to Stand: 8 reps 04/09/2024: 13 resps GAIT: Distance walked: 85ft Assistive device utilized: None Level of assistance: Complete Independence Comments: decreased gait speed, antalgia L   TREATMENT: [facility] Adult PT Treatment:                                                DATE: 04/15/24 Bike level 3 x 4 mins while gathering subjective and planning session with patient TRX squat 2x10 Omega knee extension 20# 2x10 BIL Lateral step down 2x10 4in ea Omega leg press 3x8 80# Lateral walk RTB x 4 laps Monster walk x 4 laps RTB TRX lunge - increased pain  [facility] Adult PT Treatment:                                                DATE: 04/09/24 Bike level 3 x 4 mins while gathering subjective and planning session with patient Omega leg press 3x8 80# Standing hip abd 2x10 42.5# TRX squat x 10 Omega knee extension 20# 2x10 BIL Lateral walk x 4 RTB  Lateral step down 2x10 4in ea Reassessment of tests/measures, goals, and outcomes  [facility] Adult PT Treatment:                                                DATE: 04/08/24 Bike level 3 x 4 mins while gathering subjective and planning session with patient Omega leg press 3x8 80# Standing hip abd 2x10 37.5# TRX squat x 5 Omega knee extension 25# 2x10 BIL Omega knee flexion 55# 2x10 Step ups 8in each x 15 with opp march  [facility] Adult PT Treatment:  DATE: 04/02/24 Bike level 3 x 4 mins while gathering subjective and planning session with patient Standing hip abd 2x10 37.5# Omega knee extension 25# 2x10 BIL Omega knee flexion 45# 2x10 Step ups 8in each x 15 Lateral walk RTB in // x 4 laps TRX squats x 10 - quick fatigue  PATIENT EDUCATION:  Education details: eval findings, LEFS, HEP, POC Person educated: Patient Education method: Explanation, Demonstration, and Handouts Education comprehension: verbalized understanding and returned demonstration  HOME EXERCISE PROGRAM: Access Code: [email or web address] URL: [email or web address] Date: 03/04/2024 Prepared by: Alm Kingdom  Exercises - Supine Heel Slide  - 1 x daily - 7 x weekly - 2 sets - 10 reps - 5 sec hold - Supine Quadricep Sets  - 1 x daily - 7 x weekly - 2 sets - 10 reps - 5 sec hold - Active Straight Leg Raise with Quad Set  - 1 x daily - 7 x weekly - 3-4 sets - 5 reps - Sidelying Hip Abduction  - 1 x daily - 7 x weekly - 3-4 sets - 5 reps  ASSESSMENT:  CLINICAL IMPRESSION: Pt was able to complete prescribed exercises with no adverse effect. Today we continued to progress quad and proximal hip strength and she was able to perform higher level exercises with only slight discomfort. We will continue to progress per POC as prescribed.   EVAL: Patient is a 33 y.o. F who was seen today for physical therapy evaluation and treatment for bilateral knee pain and discomfort. Physical findings are consistent with injury/surgical hx along with bilateral LE weakness particularly in lateral hip and quad. Her LEFS score shows moderate disability in performance of home ADLs and community level activities. Pt would benefit from skilled PT services with specific focus on LE strengthening for improving movement and decreasing knee pain.   OBJECTIVE IMPAIRMENTS: Abnormal gait, decreased activity tolerance, decreased mobility, difficulty walking, decreased ROM, decreased strength,  and pain  ACTIVITY LIMITATIONS: carrying, lifting, sitting, standing, squatting, stairs, transfers, and locomotion level  PARTICIPATION LIMITATIONS: meal prep, cleaning, driving, shopping, community activity, and yard work  PERSONAL FACTORS: Time since onset of injury/illness/exacerbation and 3+ comorbidities: Bipolar disorder, Depression, intellectual disability, bilateral ACL reconstruction  are also affecting patient's functional outcome.   REHAB POTENTIAL: Good  CLINICAL DECISION MAKING: Evolving/moderate complexity  EVALUATION COMPLEXITY: Moderate   GOALS: Goals reviewed with patient? No  SHORT TERM GOALS: Target date: 03/25/2024   Pt will be compliant and knowledgeable with initial HEP for improved comfort and carryover Baseline: initial HEP given  Goal status: MET Pt reports compliance 03/26/24  2.  Pt will self report bilateral knee pain no greater than 7/10 for improved comfort and functional ability Baseline: 10/10 at worst 04/09/2024: 6/10 Goal status: MET  LONG TERM GOALS: Target date: 04/29/2024   Pt will improve LEFS to no less than 50/80 as proxy for functional improvement with home ADLs and higher level community activity Baseline: 34/80 04/09/2024: 36/80 Goal status: IN PROGRESS   2.  Pt will self report bilateral knee pain no greater than 3/10 for improved comfort and functional ability Baseline: 10/10 at worst 04/09/2024: 6/10 Goal status: IN PROGRESS   3.  Pt will increase 30 Second Sit to Stand rep count to no less than 12 reps for improved balance, strength, and functional mobility Baseline: 8 reps 04/09/2024: 12 reps Goal status: MET   4.  Pt will improve all LE MMT to no less than 4/5 for all tested motions for improved knee stabilization and  decreased pain Baseline: see MMT chart Goal status: INITIAL  5.  Pt will be able to squat with hands to floor without increase in bilateral knee pain for improved comfort and functional ability  Baseline:  unable Goal status: INITIAL   PLAN:  PT FREQUENCY: 2x/week  PT DURATION: 8 weeks  PLANNED INTERVENTIONS: 97164- PT Re-evaluation, 97110-Therapeutic exercises, 97530- Therapeutic activity, 97112- Neuromuscular re-education, 97535- Self Care, [id number]- Manual therapy, [id number]- Gait training, [id number]- Electrical stimulation (unattended), [id number]- Electrical stimulation (manual), 97016- Vasopneumatic device, Cryotherapy, and Moist heat  PLAN FOR NEXT SESSION: assess HEP response, quad and hip strengthening, progress functional lifting movements    Alm JAYSON Kingdom, PT 04/15/2024, 3:12 PM

## 2024-04-21 ENCOUNTER — Ambulatory Visit: Attending: Family Medicine

## 2024-04-21 DIAGNOSIS — M25561 Pain in right knee: Secondary | ICD-10-CM | POA: Insufficient documentation

## 2024-04-21 DIAGNOSIS — G8929 Other chronic pain: Secondary | ICD-10-CM | POA: Insufficient documentation

## 2024-04-21 DIAGNOSIS — R2689 Other abnormalities of gait and mobility: Secondary | ICD-10-CM | POA: Diagnosis not present

## 2024-04-21 DIAGNOSIS — M25562 Pain in left knee: Secondary | ICD-10-CM | POA: Diagnosis not present

## 2024-04-21 DIAGNOSIS — M6281 Muscle weakness (generalized): Secondary | ICD-10-CM | POA: Diagnosis not present

## 2024-04-21 NOTE — Therapy (Signed)
 OUTPATIENT PHYSICAL THERAPY TREATMENT    Patient Name: Cindy Phillips MRN: [id number] DOB:26-Jan-1991, 33 y.o., female Today's Date: 04/21/2024  END OF SESSION:  PT End of Session - 04/21/24 1349     Visit Number 12    Number of Visits 17    Date for PT Re-Evaluation 04/29/24    Authorization Type [employer] Dual Complete    PT Start Time 1400    PT Stop Time 1440    PT Time Calculation (min) 40 min    Activity Tolerance Patient tolerated treatment well    Behavior During Therapy WFL for tasks assessed/performed         Past Medical History:  Diagnosis Date   Arthritis    Bipolar 1 disorder (HCC)    Bowel incontinence    Depression    Diabetes mellitus without complication (HCC)    Headache    migraines   Mental disability    Sleep apnea    Past Surgical History:  Procedure Laterality Date   ANTERIOR CRUCIATE LIGAMENT REPAIR Right 05/11/2022   Procedure: RIGHT KNEE ARTHROSCOPY; LATERAL MENISECTOMY; ANTERIOR CRUCIATE LIGAMENT (ACL) REPAIR; CHONDROPLASTY;  Surgeon: Shari Sieving, MD;  Location: [facility];  Service: Orthopedics;  Laterality: Right;   FOOT SURGERY     KNEE ARTHROSCOPY W/ ACL RECONSTRUCTION Left 10/12/2020   TOOTH EXTRACTION     Patient Active Problem List   Diagnosis Date Noted   Pain in left elbow 07/27/2020   Prediabetes 02/03/2019   Obstructive sleep apnea 04/23/2018   Other fatigue 11/26/2017   Shortness of breath on exertion 11/26/2017   Type 2 diabetes mellitus without complication, without long-term current use of insulin  (HCC) 11/26/2017   Other hyperlipidemia 11/26/2017   Vitamin D  deficiency 11/26/2017   Schizoaffective disorder, bipolar type (HCC) 09/14/2014   Intellectual disability 09/10/2014    PCP: Leigh Lung, MD  REFERRING PROVIDER: Shari Sieving, MD  REFERRING DIAG: Hx of ACL Reconstruction   THERAPY DIAG:  Chronic pain of left knee  Chronic pain of right knee  Muscle weakness (generalized)  Other abnormalities of gait  and mobility  Rationale for Evaluation and Treatment: Rehabilitation  ONSET DATE: Chronic  SUBJECTIVE:   SUBJECTIVE STATEMENT: Patient presents to PT reporting no pain in her knees today, had some soreness after previous session.   EVAL: Pt presents to PT with reports of chronic bilateral knee pain in presence of previous ACL repairs. She is well known to therapist and has been seen for both ACL recoveries in past. She currently has significant pain when standing for long periods of time, or when walking/squatting. Denies referral of symptoms past knee, pain is sharp and along bilateral knee joint lines.   PERTINENT HISTORY: Bipolar disorder, Depression, intellectual disability, bilateral ACL reconstruction   PAIN:  Are you having pain?  Yes: NPRS scale: 7/10 Worst: 10/10 Pain location: bilateral inferior knee Pain description: sharp, sore Aggravating factors: standing, stairs,  Relieving factors: rest  PRECAUTIONS: None  RED FLAGS: None   WEIGHT BEARING RESTRICTIONS: No  FALLS:  Has patient fallen in last 6 months? -   LIVING ENVIRONMENT: Lives with: lives with their family Lives in: House/apartment Stairs: 4 STE - handrail  Has following equipment at home: None  OCCUPATION: Disability  PLOF: Independent with basic ADLs  PATIENT GOALS: decrease knee pain, improve strength and be able to work out  NEXT MD VISIT: PRN  OBJECTIVE:  Note: Objective measures were completed at Evaluation unless otherwise noted.  DIAGNOSTIC FINDINGS: N/A  PATIENT  SURVEYS:  LEFS: 34/80  COGNITION: Overall cognitive status: Within functional limits for tasks assessed     SENSATION: WFL  POSTURE: rounded shoulders, forward head, increased lumbar lordosis, and large body habitus  PALPATION: TTP to distal quad  LOWER EXTREMITY ROM:  Active ROM Right eval Left eval  Hip flexion    Hip extension    Hip abduction    Hip adduction    Hip internal rotation    Hip external  rotation    Knee flexion 114 118  Knee extension 0 5  Ankle dorsiflexion    Ankle plantarflexion    Ankle inversion    Ankle eversion     (Blank rows = not tested)  LOWER EXTREMITY MMT:  MMT Right eval Left eval  Hip flexion 3+ 3+  Hip extension    Hip abduction 3+ 3+  Hip adduction    Hip internal rotation    Hip external rotation    Knee flexion 3+ 3+  Knee extension 3+ 3+  Ankle dorsiflexion    Ankle plantarflexion    Ankle inversion    Ankle eversion     (Blank rows = not tested)  LOWER EXTREMITY SPECIAL TESTS:  Knee special tests: Lachman Test: negative  FUNCTIONAL TESTS: 30 Second Sit to Stand: 8 reps 04/09/2024: 13 resps GAIT: Distance walked: 50ft Assistive device utilized: None Level of assistance: Complete Independence Comments: decreased gait speed, antalgia L   TREATMENT: [facility] Adult PT Treatment:                                                DATE: 04/21/24 Therapeutic Activity: Bike level 3 x 5 mins while gathering subjective and planning session with patient TRX squat 2x10 Omega knee extension 20# 2x10 BIL Omega knee flexion 25# 2x10 BIL Lateral step down 2x10 4in ea Omega leg press 2x10 80# Lateral walk RTB x 4 laps Monster walk x 4 laps RTB TRX lunge - x5 BIL  [facility] Adult PT Treatment:                                                DATE: 04/15/24 Bike level 3 x 4 mins while gathering subjective and planning session with patient TRX squat 2x10 Omega knee extension 20# 2x10 BIL Lateral step down 2x10 4in ea Omega leg press 3x8 80# Lateral walk RTB x 4 laps Monster walk x 4 laps RTB TRX lunge - increased pain  [facility] Adult PT Treatment:                                                DATE: 04/09/24 Bike level 3 x 4 mins while gathering subjective and planning session with patient Omega leg press 3x8 80# Standing hip abd 2x10 42.5# TRX squat x 10 Omega knee extension 20# 2x10 BIL Lateral walk x 4 RTB  Lateral step down 2x10 4in  ea Reassessment of tests/measures, goals, and outcomes    PATIENT EDUCATION:  Education details: eval findings, LEFS, HEP, POC Person educated: Patient Education method: Explanation, Demonstration, and Handouts Education comprehension: verbalized understanding and returned demonstration  HOME EXERCISE PROGRAM: Access Code: [email or web address] URL: [email or web address] Date: 03/04/2024 Prepared by: Alm Kingdom  Exercises - Supine Heel Slide  - 1 x daily - 7 x weekly - 2 sets - 10 reps - 5 sec hold - Supine Quadricep Sets  - 1 x daily - 7 x weekly - 2 sets - 10 reps - 5 sec hold - Active Straight Leg Raise with Quad Set  - 1 x daily - 7 x weekly - 3-4 sets - 5 reps - Sidelying Hip Abduction  - 1 x daily - 7 x weekly - 3-4 sets - 5 reps  ASSESSMENT:  CLINICAL IMPRESSION: Patient presents to PT reporting no current pain and some increased soreness after last session. Session today continued to focus on LE strengthening, she was able to tolerate squats today. Patient was able to tolerate all prescribed exercises with no adverse effects. Patient continues to benefit from skilled PT services and should be progressed as able to improve functional independence.   EVAL: Patient is a 33 y.o. F who was seen today for physical therapy evaluation and treatment for bilateral knee pain and discomfort. Physical findings are consistent with injury/surgical hx along with bilateral LE weakness particularly in lateral hip and quad. Her LEFS score shows moderate disability in performance of home ADLs and community level activities. Pt would benefit from skilled PT services with specific focus on LE strengthening for improving movement and decreasing knee pain.   OBJECTIVE IMPAIRMENTS: Abnormal gait, decreased activity tolerance, decreased mobility, difficulty walking, decreased ROM, decreased strength, and pain  ACTIVITY LIMITATIONS: carrying, lifting, sitting, standing, squatting, stairs, transfers,  and locomotion level  PARTICIPATION LIMITATIONS: meal prep, cleaning, driving, shopping, community activity, and yard work  PERSONAL FACTORS: Time since onset of injury/illness/exacerbation and 3+ comorbidities: Bipolar disorder, Depression, intellectual disability, bilateral ACL reconstruction  are also affecting patient's functional outcome.   REHAB POTENTIAL: Good  CLINICAL DECISION MAKING: Evolving/moderate complexity  EVALUATION COMPLEXITY: Moderate   GOALS: Goals reviewed with patient? No  SHORT TERM GOALS: Target date: 03/25/2024   Pt will be compliant and knowledgeable with initial HEP for improved comfort and carryover Baseline: initial HEP given  Goal status: MET Pt reports compliance 03/26/24  2.  Pt will self report bilateral knee pain no greater than 7/10 for improved comfort and functional ability Baseline: 10/10 at worst 04/09/2024: 6/10 Goal status: MET  LONG TERM GOALS: Target date: 04/29/2024   Pt will improve LEFS to no less than 50/80 as proxy for functional improvement with home ADLs and higher level community activity Baseline: 34/80 04/09/2024: 36/80 Goal status: IN PROGRESS   2.  Pt will self report bilateral knee pain no greater than 3/10 for improved comfort and functional ability Baseline: 10/10 at worst 04/09/2024: 6/10 Goal status: IN PROGRESS   3.  Pt will increase 30 Second Sit to Stand rep count to no less than 12 reps for improved balance, strength, and functional mobility Baseline: 8 reps 04/09/2024: 12 reps Goal status: MET   4.  Pt will improve all LE MMT to no less than 4/5 for all tested motions for improved knee stabilization and decreased pain Baseline: see MMT chart Goal status: INITIAL  5.  Pt will be able to squat with hands to floor without increase in bilateral knee pain for improved comfort and functional ability  Baseline: unable Goal status: INITIAL   PLAN:  PT FREQUENCY: 2x/week  PT DURATION: 8 weeks  PLANNED  INTERVENTIONS: 97164- PT Re-evaluation, 97110-Therapeutic exercises, 97530- Therapeutic activity,  [id number]- Neuromuscular re-education, [id number]- Self Care, 02859- Manual therapy, [id number]- Gait training, [phone]- Electrical stimulation (unattended), [id number]- Electrical stimulation (manual), 97016- Vasopneumatic device, Cryotherapy, and Moist heat  PLAN FOR NEXT SESSION: assess HEP response, quad and hip strengthening, progress functional lifting movements    Corean Pouch, PTA 04/21/2024, 2:40 PM

## 2024-04-22 ENCOUNTER — Encounter (INDEPENDENT_AMBULATORY_CARE_PROVIDER_SITE_OTHER): Admitting: Ophthalmology

## 2024-04-22 DIAGNOSIS — H35412 Lattice degeneration of retina, left eye: Secondary | ICD-10-CM

## 2024-04-23 ENCOUNTER — Ambulatory Visit

## 2024-04-23 DIAGNOSIS — G8929 Other chronic pain: Secondary | ICD-10-CM

## 2024-04-23 DIAGNOSIS — R2689 Other abnormalities of gait and mobility: Secondary | ICD-10-CM | POA: Diagnosis not present

## 2024-04-23 DIAGNOSIS — M6281 Muscle weakness (generalized): Secondary | ICD-10-CM

## 2024-04-23 DIAGNOSIS — M25562 Pain in left knee: Secondary | ICD-10-CM | POA: Diagnosis not present

## 2024-04-23 DIAGNOSIS — M25561 Pain in right knee: Secondary | ICD-10-CM | POA: Diagnosis not present

## 2024-04-23 NOTE — Therapy (Signed)
 OUTPATIENT PHYSICAL THERAPY TREATMENT    Patient Name: Cindy Phillips MRN: [id number] DOB:Aug 05, 1991, 33 y.o., female Today's Date: 04/23/2024  END OF SESSION:  PT End of Session - 04/23/24 1211     Visit Number 13    Number of Visits 17    Date for PT Re-Evaluation 04/29/24    Authorization Type [employer] Dual Complete    PT Start Time 1210    PT Stop Time 1248    PT Time Calculation (min) 38 min    Activity Tolerance Patient tolerated treatment well    Behavior During Therapy WFL for tasks assessed/performed          Past Medical History:  Diagnosis Date   Arthritis    Bipolar 1 disorder (HCC)    Bowel incontinence    Depression    Diabetes mellitus without complication (HCC)    Headache    migraines   Mental disability    Sleep apnea    Past Surgical History:  Procedure Laterality Date   ANTERIOR CRUCIATE LIGAMENT REPAIR Right 05/11/2022   Procedure: RIGHT KNEE ARTHROSCOPY; LATERAL MENISECTOMY; ANTERIOR CRUCIATE LIGAMENT (ACL) REPAIR; CHONDROPLASTY;  Surgeon: Shari Sieving, MD;  Location: [facility];  Service: Orthopedics;  Laterality: Right;   FOOT SURGERY     KNEE ARTHROSCOPY W/ ACL RECONSTRUCTION Left 10/12/2020   TOOTH EXTRACTION     Patient Active Problem List   Diagnosis Date Noted   Pain in left elbow 07/27/2020   Prediabetes 02/03/2019   Obstructive sleep apnea 04/23/2018   Other fatigue 11/26/2017   Shortness of breath on exertion 11/26/2017   Type 2 diabetes mellitus without complication, without long-term current use of insulin  (HCC) 11/26/2017   Other hyperlipidemia 11/26/2017   Vitamin D  deficiency 11/26/2017   Schizoaffective disorder, bipolar type (HCC) 09/14/2014   Intellectual disability 09/10/2014    PCP: Leigh Lung, MD  REFERRING PROVIDER: Shari Sieving, MD  REFERRING DIAG: Hx of ACL Reconstruction   THERAPY DIAG:  Chronic pain of left knee  Chronic pain of right knee  Muscle weakness (generalized)  Rationale for Evaluation and  Treatment: Rehabilitation  ONSET DATE: Chronic  SUBJECTIVE:   SUBJECTIVE STATEMENT: Patient reports that she is having some soreness and pain in her knees, also that she has been having feelings of the knee giving out and causing her to almost fall.   EVAL: Pt presents to PT with reports of chronic bilateral knee pain in presence of previous ACL repairs. She is well known to therapist and has been seen for both ACL recoveries in past. She currently has significant pain when standing for long periods of time, or when walking/squatting. Denies referral of symptoms past knee, pain is sharp and along bilateral knee joint lines.   PERTINENT HISTORY: Bipolar disorder, Depression, intellectual disability, bilateral ACL reconstruction   PAIN:  Are you having pain?  Yes: NPRS scale: 7/10 Worst: 10/10 Pain location: bilateral inferior knee Pain description: sharp, sore Aggravating factors: standing, stairs,  Relieving factors: rest  PRECAUTIONS: None  RED FLAGS: None   WEIGHT BEARING RESTRICTIONS: No  FALLS:  Has patient fallen in last 6 months? -   LIVING ENVIRONMENT: Lives with: lives with their family Lives in: House/apartment Stairs: 4 STE - handrail  Has following equipment at home: None  OCCUPATION: Disability  PLOF: Independent with basic ADLs  PATIENT GOALS: decrease knee pain, improve strength and be able to work out  NEXT MD VISIT: PRN  OBJECTIVE:  Note: Objective measures were completed at Evaluation unless  otherwise noted.  DIAGNOSTIC FINDINGS: N/A  PATIENT SURVEYS:  LEFS: 34/80  COGNITION: Overall cognitive status: Within functional limits for tasks assessed     SENSATION: WFL  POSTURE: rounded shoulders, forward head, increased lumbar lordosis, and large body habitus  PALPATION: TTP to distal quad  LOWER EXTREMITY ROM:  Active ROM Right eval Left eval  Hip flexion    Hip extension    Hip abduction    Hip adduction    Hip internal rotation     Hip external rotation    Knee flexion 114 118  Knee extension 0 5  Ankle dorsiflexion    Ankle plantarflexion    Ankle inversion    Ankle eversion     (Blank rows = not tested)  LOWER EXTREMITY MMT:  MMT Right eval Left eval  Hip flexion 3+ 3+  Hip extension    Hip abduction 3+ 3+  Hip adduction    Hip internal rotation    Hip external rotation    Knee flexion 3+ 3+  Knee extension 3+ 3+  Ankle dorsiflexion    Ankle plantarflexion    Ankle inversion    Ankle eversion     (Blank rows = not tested)  LOWER EXTREMITY SPECIAL TESTS:  Knee special tests: Lachman Test: negative  FUNCTIONAL TESTS: 30 Second Sit to Stand: 8 reps 04/09/2024: 13 resps GAIT: Distance walked: 64ft Assistive device utilized: None Level of assistance: Complete Independence Comments: decreased gait speed, antalgia L   TREATMENT: [facility] Adult PT Treatment:                                                DATE: 04/23/24 Therapeutic Activity: Bike level 3 x 6 mins while gathering subjective and planning session with patient Omega knee extension 20# 2x10 BIL Omega knee flexion 35# 2x10 BIL Lateral step down 2x10 ea 4in Omega leg press 2x10 60#  [facility] Adult PT Treatment:                                                DATE: 04/21/24 Therapeutic Activity: Bike level 3 x 5 mins while gathering subjective and planning session with patient TRX squat 2x10 Omega knee extension 20# 2x10 BIL Omega knee flexion 25# 2x10 BIL Lateral step down 2x10 4in ea Omega leg press 2x10 80# Lateral walk RTB x 4 laps Monster walk x 4 laps RTB TRX lunge - x5 BIL  [facility] Adult PT Treatment:                                                DATE: 04/15/24 Bike level 3 x 4 mins while gathering subjective and planning session with patient TRX squat 2x10 Omega knee extension 20# 2x10 BIL Lateral step down 2x10 4in ea Omega leg press 3x8 80# Lateral walk RTB x 4 laps Monster walk x 4 laps RTB TRX lunge - increased  pain   PATIENT EDUCATION:  Education details: eval findings, LEFS, HEP, POC Person educated: Patient Education method: Explanation, Demonstration, and Handouts Education comprehension: verbalized understanding and returned demonstration  HOME EXERCISE PROGRAM: Access Code:  [email or web address] URL: [email or web address] Date: 03/04/2024 Prepared by: Alm Kingdom  Exercises - Supine Heel Slide  - 1 x daily - 7 x weekly - 2 sets - 10 reps - 5 sec hold - Supine Quadricep Sets  - 1 x daily - 7 x weekly - 2 sets - 10 reps - 5 sec hold - Active Straight Leg Raise with Quad Set  - 1 x daily - 7 x weekly - 3-4 sets - 5 reps - Sidelying Hip Abduction  - 1 x daily - 7 x weekly - 3-4 sets - 5 reps  ASSESSMENT:  CLINICAL IMPRESSION: Patient presents to PT reporting some soreness and pain in her knees and quads today. Session today continued to focus on LE strengthening with decrease in difficulty today to accommodate increased pain/soreness. Patient was able to tolerate all prescribed exercises with no adverse effects. Patient continues to benefit from skilled PT services and should be progressed as able to improve functional independence.   EVAL: Patient is a 33 y.o. F who was seen today for physical therapy evaluation and treatment for bilateral knee pain and discomfort. Physical findings are consistent with injury/surgical hx along with bilateral LE weakness particularly in lateral hip and quad. Her LEFS score shows moderate disability in performance of home ADLs and community level activities. Pt would benefit from skilled PT services with specific focus on LE strengthening for improving movement and decreasing knee pain.   OBJECTIVE IMPAIRMENTS: Abnormal gait, decreased activity tolerance, decreased mobility, difficulty walking, decreased ROM, decreased strength, and pain  ACTIVITY LIMITATIONS: carrying, lifting, sitting, standing, squatting, stairs, transfers, and locomotion  level  PARTICIPATION LIMITATIONS: meal prep, cleaning, driving, shopping, community activity, and yard work  PERSONAL FACTORS: Time since onset of injury/illness/exacerbation and 3+ comorbidities: Bipolar disorder, Depression, intellectual disability, bilateral ACL reconstruction  are also affecting patient's functional outcome.   REHAB POTENTIAL: Good  CLINICAL DECISION MAKING: Evolving/moderate complexity  EVALUATION COMPLEXITY: Moderate   GOALS: Goals reviewed with patient? No  SHORT TERM GOALS: Target date: 03/25/2024   Pt will be compliant and knowledgeable with initial HEP for improved comfort and carryover Baseline: initial HEP given  Goal status: MET Pt reports compliance 03/26/24  2.  Pt will self report bilateral knee pain no greater than 7/10 for improved comfort and functional ability Baseline: 10/10 at worst 04/09/2024: 6/10 Goal status: MET  LONG TERM GOALS: Target date: 04/29/2024   Pt will improve LEFS to no less than 50/80 as proxy for functional improvement with home ADLs and higher level community activity Baseline: 34/80 04/09/2024: 36/80 Goal status: IN PROGRESS   2.  Pt will self report bilateral knee pain no greater than 3/10 for improved comfort and functional ability Baseline: 10/10 at worst 04/09/2024: 6/10 Goal status: IN PROGRESS   3.  Pt will increase 30 Second Sit to Stand rep count to no less than 12 reps for improved balance, strength, and functional mobility Baseline: 8 reps 04/09/2024: 12 reps Goal status: MET   4.  Pt will improve all LE MMT to no less than 4/5 for all tested motions for improved knee stabilization and decreased pain Baseline: see MMT chart Goal status: INITIAL  5.  Pt will be able to squat with hands to floor without increase in bilateral knee pain for improved comfort and functional ability  Baseline: unable Goal status: INITIAL   PLAN:  PT FREQUENCY: 2x/week  PT DURATION: 8 weeks  PLANNED INTERVENTIONS: [id number]-  PT Re-evaluation, 97110-Therapeutic exercises, 97530- Therapeutic activity, 97112- Neuromuscular re-education,  02464- Self Care, 02859- Manual therapy, [id number]- Gait training, [phone]- Electrical stimulation (unattended), [id number]- Electrical stimulation (manual), 97016- Vasopneumatic device, Cryotherapy, and Moist heat  PLAN FOR NEXT SESSION: assess HEP response, quad and hip strengthening, progress functional lifting movements    Corean Pouch, PTA 04/23/2024, 12:50 PM

## 2024-04-27 ENCOUNTER — Encounter

## 2024-04-27 DIAGNOSIS — Z5181 Encounter for therapeutic drug level monitoring: Secondary | ICD-10-CM | POA: Diagnosis not present

## 2024-04-28 ENCOUNTER — Ambulatory Visit

## 2024-04-28 DIAGNOSIS — G8929 Other chronic pain: Secondary | ICD-10-CM | POA: Diagnosis not present

## 2024-04-28 DIAGNOSIS — M6281 Muscle weakness (generalized): Secondary | ICD-10-CM | POA: Diagnosis not present

## 2024-04-28 DIAGNOSIS — M25561 Pain in right knee: Secondary | ICD-10-CM | POA: Diagnosis not present

## 2024-04-28 DIAGNOSIS — R2689 Other abnormalities of gait and mobility: Secondary | ICD-10-CM

## 2024-04-28 DIAGNOSIS — M25562 Pain in left knee: Secondary | ICD-10-CM | POA: Diagnosis not present

## 2024-04-28 NOTE — Therapy (Signed)
 OUTPATIENT PHYSICAL THERAPY TREATMENT    Patient Name: Cindy Phillips MRN: [id number] DOB:1991/06/06, 33 y.o., female Today's Date: 04/28/2024  END OF SESSION:  PT End of Session - 04/28/24 1355     Visit Number 14    Number of Visits 17    Date for PT Re-Evaluation 04/29/24    Authorization Type [employer] Dual Complete    PT Start Time 1400    Activity Tolerance Patient tolerated treatment well    Behavior During Therapy WFL for tasks assessed/performed           Past Medical History:  Diagnosis Date   Arthritis    Bipolar 1 disorder (HCC)    Bowel incontinence    Depression    Diabetes mellitus without complication (HCC)    Headache    migraines   Mental disability    Sleep apnea    Past Surgical History:  Procedure Laterality Date   ANTERIOR CRUCIATE LIGAMENT REPAIR Right 05/11/2022   Procedure: RIGHT KNEE ARTHROSCOPY; LATERAL MENISECTOMY; ANTERIOR CRUCIATE LIGAMENT (ACL) REPAIR; CHONDROPLASTY;  Surgeon: Shari Sieving, MD;  Location: [facility];  Service: Orthopedics;  Laterality: Right;   FOOT SURGERY     KNEE ARTHROSCOPY W/ ACL RECONSTRUCTION Left 10/12/2020   TOOTH EXTRACTION     Patient Active Problem List   Diagnosis Date Noted   Pain in left elbow 07/27/2020   Prediabetes 02/03/2019   Obstructive sleep apnea 04/23/2018   Other fatigue 11/26/2017   Shortness of breath on exertion 11/26/2017   Type 2 diabetes mellitus without complication, without long-term current use of insulin  (HCC) 11/26/2017   Other hyperlipidemia 11/26/2017   Vitamin D  deficiency 11/26/2017   Schizoaffective disorder, bipolar type (HCC) 09/14/2014   Intellectual disability 09/10/2014    PCP: Leigh Lung, MD  REFERRING PROVIDER: Shari Sieving, MD  REFERRING DIAG: Hx of ACL Reconstruction   THERAPY DIAG:  Chronic pain of left knee  Chronic pain of right knee  Muscle weakness (generalized)  Other abnormalities of gait and mobility  Rationale for Evaluation and Treatment:  Rehabilitation  ONSET DATE: Chronic  SUBJECTIVE:   SUBJECTIVE STATEMENT: Pt presents to PT with no current pain. Has been compliant with HEP. Feels like her knees are getting stronger but they give out if she walks a greater distance.   EVAL: Pt presents to PT with reports of chronic bilateral knee pain in presence of previous ACL repairs. She is well known to therapist and has been seen for both ACL recoveries in past. She currently has significant pain when standing for long periods of time, or when walking/squatting. Denies referral of symptoms past knee, pain is sharp and along bilateral knee joint lines.   PERTINENT HISTORY: Bipolar disorder, Depression, intellectual disability, bilateral ACL reconstruction   PAIN:  Are you having pain?  Yes: NPRS scale: 7/10 Worst: 10/10 Pain location: bilateral inferior knee Pain description: sharp, sore Aggravating factors: standing, stairs,  Relieving factors: rest  PRECAUTIONS: None  RED FLAGS: None   WEIGHT BEARING RESTRICTIONS: No  FALLS:  Has patient fallen in last 6 months? -   LIVING ENVIRONMENT: Lives with: lives with their family Lives in: House/apartment Stairs: 4 STE - handrail  Has following equipment at home: None  OCCUPATION: Disability  PLOF: Independent with basic ADLs  PATIENT GOALS: decrease knee pain, improve strength and be able to work out  NEXT MD VISIT: PRN  OBJECTIVE:  Note: Objective measures were completed at Evaluation unless otherwise noted.  DIAGNOSTIC FINDINGS: N/A  PATIENT SURVEYS:  LEFS: 34/80  COGNITION: Overall cognitive status: Within functional limits for tasks assessed     SENSATION: WFL  POSTURE: rounded shoulders, forward head, increased lumbar lordosis, and large body habitus  PALPATION: TTP to distal quad  LOWER EXTREMITY ROM:  Active ROM Right eval Left eval  Hip flexion    Hip extension    Hip abduction    Hip adduction    Hip internal rotation    Hip  external rotation    Knee flexion 114 118  Knee extension 0 5  Ankle dorsiflexion    Ankle plantarflexion    Ankle inversion    Ankle eversion     (Blank rows = not tested)  LOWER EXTREMITY MMT:  MMT Right eval Left eval  Hip flexion 3+ 3+  Hip extension    Hip abduction 3+ 3+  Hip adduction    Hip internal rotation    Hip external rotation    Knee flexion 3+ 3+  Knee extension 3+ 3+  Ankle dorsiflexion    Ankle plantarflexion    Ankle inversion    Ankle eversion     (Blank rows = not tested)  LOWER EXTREMITY SPECIAL TESTS:  Knee special tests: Lachman Test: negative  FUNCTIONAL TESTS: 30 Second Sit to Stand: 8 reps 04/09/2024: 13 resps GAIT: Distance walked: 32ft Assistive device utilized: None Level of assistance: Complete Independence Comments: decreased gait speed, antalgia L   TREATMENT: [facility] Adult PT Treatment:                                                DATE: 04/23/24 Therapeutic Activity: Bike level 3 x 4 mins for functional activity tolerance Assessment of tests/measures, goals, and outcomes Cybex leg press BLE 2x10 60lb Standing hip abd 2x10 37.5lb Step up fwd 10in x 10 ea  [facility] Adult PT Treatment:                                                DATE: 04/21/24 Therapeutic Activity: Bike level 3 x 5 mins while gathering subjective and planning session with patient TRX squat 2x10 Omega knee extension 20# 2x10 BIL Omega knee flexion 25# 2x10 BIL Lateral step down 2x10 4in ea Omega leg press 2x10 80# Lateral walk RTB x 4 laps Monster walk x 4 laps RTB TRX lunge - x5 BIL  [facility] Adult PT Treatment:                                                DATE: 04/15/24 Bike level 3 x 4 mins while gathering subjective and planning session with patient TRX squat 2x10 Omega knee extension 20# 2x10 BIL Lateral step down 2x10 4in ea Omega leg press 3x8 80# Lateral walk RTB x 4 laps Monster walk x 4 laps RTB TRX lunge - increased pain   PATIENT EDUCATION:   Education details: eval findings, LEFS, HEP, POC Person educated: Patient Education method: Explanation, Demonstration, and Handouts Education comprehension: verbalized understanding and returned demonstration  HOME EXERCISE PROGRAM: Access Code: [email or web address] URL: [email or web address] Date: 03/04/2024 Prepared by: Alm Kingdom  Exercises - Supine  Heel Slide  - 1 x daily - 7 x weekly - 2 sets - 10 reps - 5 sec hold - Supine Quadricep Sets  - 1 x daily - 7 x weekly - 2 sets - 10 reps - 5 sec hold - Active Straight Leg Raise with Quad Set  - 1 x daily - 7 x weekly - 3-4 sets - 5 reps - Sidelying Hip Abduction  - 1 x daily - 7 x weekly - 3-4 sets - 5 reps  ASSESSMENT:  CLINICAL IMPRESSION: ***  EVAL: Patient is a 33 y.o. F who was seen today for physical therapy evaluation and treatment for bilateral knee pain and discomfort. Physical findings are consistent with injury/surgical hx along with bilateral LE weakness particularly in lateral hip and quad. Her LEFS score shows moderate disability in performance of home ADLs and community level activities. Pt would benefit from skilled PT services with specific focus on LE strengthening for improving movement and decreasing knee pain.   OBJECTIVE IMPAIRMENTS: Abnormal gait, decreased activity tolerance, decreased mobility, difficulty walking, decreased ROM, decreased strength, and pain  ACTIVITY LIMITATIONS: carrying, lifting, sitting, standing, squatting, stairs, transfers, and locomotion level  PARTICIPATION LIMITATIONS: meal prep, cleaning, driving, shopping, community activity, and yard work  PERSONAL FACTORS: Time since onset of injury/illness/exacerbation and 3+ comorbidities: Bipolar disorder, Depression, intellectual disability, bilateral ACL reconstruction  are also affecting patient's functional outcome.   REHAB POTENTIAL: Good  CLINICAL DECISION MAKING: Evolving/moderate complexity  EVALUATION COMPLEXITY:  Moderate   GOALS: Goals reviewed with patient? No  SHORT TERM GOALS: Target date: 03/25/2024   Pt will be compliant and knowledgeable with initial HEP for improved comfort and carryover Baseline: initial HEP given  Goal status: MET Pt reports compliance 03/26/24  2.  Pt will self report bilateral knee pain no greater than 7/10 for improved comfort and functional ability Baseline: 10/10 at worst 04/09/2024: 6/10 Goal status: MET  LONG TERM GOALS: Target date: 06/19/2024    Pt will improve LEFS to no less than 50/80 as proxy for functional improvement with home ADLs and higher level community activity Baseline: 34/80 04/09/2024: 36/80 Goal status: IN PROGRESS   2.  Pt will self report bilateral knee pain no greater than 3/10 for improved comfort and functional ability Baseline: 10/10 at worst 04/09/2024: 6/10 04/28/2024: 5/10 Goal status: IN PROGRESS   3.  Pt will increase 30 Second Sit to Stand rep count to no less than 12 reps for improved balance, strength, and functional mobility Baseline: 8 reps 04/09/2024: 12 reps Goal status: MET   4.  Pt will improve all LE MMT to no less than 4/5 for all tested motions for improved knee stabilization and decreased pain Baseline: see MMT chart Goal status: INITIAL  5.  Pt will be able to squat with hands to floor without increase in bilateral knee pain for improved comfort and functional ability  Baseline: unable Goal status: INITIAL   PLAN:  PT FREQUENCY: 2x/week  PT DURATION: 8 weeks  PLANNED INTERVENTIONS: 97164- PT Re-evaluation, 97110-Therapeutic exercises, 97530- Therapeutic activity, 97112- Neuromuscular re-education, 97535- Self Care, 02859- Manual therapy, [id number]- Gait training, [id number]- Electrical stimulation (unattended), [id number]- Electrical stimulation (manual), 97016- Vasopneumatic device, Cryotherapy, and Moist heat  PLAN FOR NEXT SESSION: assess HEP response, quad and hip strengthening, progress functional lifting movements     Alm JAYSON Kingdom, PT 04/28/2024, 2:18 PM

## 2024-05-05 ENCOUNTER — Ambulatory Visit

## 2024-05-05 DIAGNOSIS — R2689 Other abnormalities of gait and mobility: Secondary | ICD-10-CM

## 2024-05-05 DIAGNOSIS — M25561 Pain in right knee: Secondary | ICD-10-CM | POA: Diagnosis not present

## 2024-05-05 DIAGNOSIS — M6281 Muscle weakness (generalized): Secondary | ICD-10-CM | POA: Diagnosis not present

## 2024-05-05 DIAGNOSIS — G8929 Other chronic pain: Secondary | ICD-10-CM

## 2024-05-05 DIAGNOSIS — M25562 Pain in left knee: Secondary | ICD-10-CM | POA: Diagnosis not present

## 2024-05-05 NOTE — Therapy (Signed)
 OUTPATIENT PHYSICAL THERAPY TREATMENT    Patient Name: Cindy Phillips MRN: [id number] DOB:June 02, 1991, 33 y.o., female Today's Date: 05/05/2024  END OF SESSION:  PT End of Session - 05/05/24 1449     Visit Number 15    Number of Visits 30    Date for PT Re-Evaluation 06/19/24    Authorization Type [employer] Dual Complete    PT Start Time 1445    PT Stop Time 1524    PT Time Calculation (min) 39 min    Activity Tolerance Patient tolerated treatment well    Behavior During Therapy WFL for tasks assessed/performed            Past Medical History:  Diagnosis Date   Arthritis    Bipolar 1 disorder (HCC)    Bowel incontinence    Depression    Diabetes mellitus without complication (HCC)    Headache    migraines   Mental disability    Sleep apnea    Past Surgical History:  Procedure Laterality Date   ANTERIOR CRUCIATE LIGAMENT REPAIR Right 05/11/2022   Procedure: RIGHT KNEE ARTHROSCOPY; LATERAL MENISECTOMY; ANTERIOR CRUCIATE LIGAMENT (ACL) REPAIR; CHONDROPLASTY;  Surgeon: Shari Sieving, MD;  Location: [facility];  Service: Orthopedics;  Laterality: Right;   FOOT SURGERY     KNEE ARTHROSCOPY W/ ACL RECONSTRUCTION Left 10/12/2020   TOOTH EXTRACTION     Patient Active Problem List   Diagnosis Date Noted   Pain in left elbow 07/27/2020   Prediabetes 02/03/2019   Obstructive sleep apnea 04/23/2018   Other fatigue 11/26/2017   Shortness of breath on exertion 11/26/2017   Type 2 diabetes mellitus without complication, without long-term current use of insulin  (HCC) 11/26/2017   Other hyperlipidemia 11/26/2017   Vitamin D  deficiency 11/26/2017   Schizoaffective disorder, bipolar type (HCC) 09/14/2014   Intellectual disability 09/10/2014    PCP: Leigh Lung, MD  REFERRING PROVIDER: Shari Sieving, MD  REFERRING DIAG: Hx of ACL Reconstruction   THERAPY DIAG:  Chronic pain of left knee  Chronic pain of right knee  Muscle weakness (generalized)  Other abnormalities of  gait and mobility  Rationale for Evaluation and Treatment: Rehabilitation  ONSET DATE: Chronic  SUBJECTIVE:   SUBJECTIVE STATEMENT: Pt presents to PT with no current pain, but she did have a lot of pain over the weekend in her knees.   EVAL: Pt presents to PT with reports of chronic bilateral knee pain in presence of previous ACL repairs. She is well known to therapist and has been seen for both ACL recoveries in past. She currently has significant pain when standing for long periods of time, or when walking/squatting. Denies referral of symptoms past knee, pain is sharp and along bilateral knee joint lines.   PERTINENT HISTORY: Bipolar disorder, Depression, intellectual disability, bilateral ACL reconstruction   PAIN:  Are you having pain?  Yes: NPRS scale: 7/10 Worst: 10/10 Pain location: bilateral inferior knee Pain description: sharp, sore Aggravating factors: standing, stairs,  Relieving factors: rest  PRECAUTIONS: None  RED FLAGS: None   WEIGHT BEARING RESTRICTIONS: No  FALLS:  Has patient fallen in last 6 months? -   LIVING ENVIRONMENT: Lives with: lives with their family Lives in: House/apartment Stairs: 4 STE - handrail  Has following equipment at home: None  OCCUPATION: Disability  PLOF: Independent with basic ADLs  PATIENT GOALS: decrease knee pain, improve strength and be able to work out  NEXT MD VISIT: PRN  OBJECTIVE:  Note: Objective measures were completed at Evaluation unless  otherwise noted.  DIAGNOSTIC FINDINGS: N/A  PATIENT SURVEYS:  LEFS: 34/80  COGNITION: Overall cognitive status: Within functional limits for tasks assessed     SENSATION: WFL  POSTURE: rounded shoulders, forward head, increased lumbar lordosis, and large body habitus  PALPATION: TTP to distal quad  LOWER EXTREMITY ROM:  Active ROM Right eval Left eval  Hip flexion    Hip extension    Hip abduction    Hip adduction    Hip internal rotation    Hip  external rotation    Knee flexion 114 118  Knee extension 0 5  Ankle dorsiflexion    Ankle plantarflexion    Ankle inversion    Ankle eversion     (Blank rows = not tested)  LOWER EXTREMITY MMT:  MMT Right eval Left eval Right 04/28/24 Left 04/28/24  Hip flexion 3+ 3+ 4 4  Hip extension      Hip abduction 3+ 3+ 4 4  Hip adduction      Hip internal rotation      Hip external rotation      Knee flexion 3+ 3+ 4 4  Knee extension 3+ 3+ 4 4  Ankle dorsiflexion      Ankle plantarflexion      Ankle inversion      Ankle eversion       (Blank rows = not tested)  LOWER EXTREMITY SPECIAL TESTS:  Knee special tests: Lachman Test: negative  FUNCTIONAL TESTS: 30 Second Sit to Stand: 8 reps 04/09/2024: 13 resps GAIT: Distance walked: 83ft Assistive device utilized: None Level of assistance: Complete Independence Comments: decreased gait speed, antalgia L   TREATMENT: [facility] Adult PT Treatment:                                                DATE: 05/05/24 Bike level 3 x 4 mins for functional activity tolerance Assessment of tests/measures, goals, and outcomes TRX squat 2x8 Cybex leg press BLE 3x8 60lb Standing hip abd 2x10 37.5lb Single leg RDL 2x10 15lb KB Sit to stand (squat to table) x 10 15lb KB  [facility] Adult PT Treatment:                                                DATE: 04/23/24 Therapeutic Activity: Bike level 3 x 4 mins for functional activity tolerance Assessment of tests/measures, goals, and outcomes Cybex leg press BLE 2x10 60lb Standing hip abd 2x10 37.5lb Step up fwd 10in x 10 ea Wall squats 3x10  [facility] Adult PT Treatment:                                                DATE: 04/21/24 Therapeutic Activity: Bike level 3 x 5 mins while gathering subjective and planning session with patient TRX squat 2x10 Omega knee extension 20# 2x10 BIL Omega knee flexion 25# 2x10 BIL Lateral step down 2x10 4in ea Omega leg press 2x10 80# Lateral walk RTB x 4 laps Monster walk x  4 laps RTB TRX lunge - x5 BIL  [facility] Adult PT Treatment:  DATE: 04/15/24 Bike level 3 x 4 mins while gathering subjective and planning session with patient TRX squat 2x10 Omega knee extension 20# 2x10 BIL Lateral step down 2x10 4in ea Omega leg press 3x8 80# Lateral walk RTB x 4 laps Monster walk x 4 laps RTB TRX lunge - increased pain   PATIENT EDUCATION:  Education details: eval findings, LEFS, HEP, POC Person educated: Patient Education method: Explanation, Demonstration, and Handouts Education comprehension: verbalized understanding and returned demonstration  HOME EXERCISE PROGRAM: Access Code: [email or web address] URL: [email or web address] Date: 03/04/2024 Prepared by: Alm Kingdom  Exercises - Supine Heel Slide  - 1 x daily - 7 x weekly - 2 sets - 10 reps - 5 sec hold - Supine Quadricep Sets  - 1 x daily - 7 x weekly - 2 sets - 10 reps - 5 sec hold - Active Straight Leg Raise with Quad Set  - 1 x daily - 7 x weekly - 3-4 sets - 5 reps - Sidelying Hip Abduction  - 1 x daily - 7 x weekly - 3-4 sets - 5 reps  ASSESSMENT:  CLINICAL IMPRESSION: Pt was able to complete all prescribed exercises with no adverse effect. Continues to demo improved strength, we progressed exercises today to more compound and dynamic movements. She benefit from continued PT services, will continue to progress as able per POC.   EVAL: Patient is a 33 y.o. F who was seen today for physical therapy evaluation and treatment for bilateral knee pain and discomfort. Physical findings are consistent with injury/surgical hx along with bilateral LE weakness particularly in lateral hip and quad. Her LEFS score shows moderate disability in performance of home ADLs and community level activities. Pt would benefit from skilled PT services with specific focus on LE strengthening for improving movement and decreasing knee pain.   OBJECTIVE IMPAIRMENTS: Abnormal gait,  decreased activity tolerance, decreased mobility, difficulty walking, decreased ROM, decreased strength, and pain  ACTIVITY LIMITATIONS: carrying, lifting, sitting, standing, squatting, stairs, transfers, and locomotion level  PARTICIPATION LIMITATIONS: meal prep, cleaning, driving, shopping, community activity, and yard work  PERSONAL FACTORS: Time since onset of injury/illness/exacerbation and 3+ comorbidities: Bipolar disorder, Depression, intellectual disability, bilateral ACL reconstruction  are also affecting patient's functional outcome.   REHAB POTENTIAL: Good  CLINICAL DECISION MAKING: Evolving/moderate complexity  EVALUATION COMPLEXITY: Moderate   GOALS: Goals reviewed with patient? No  SHORT TERM GOALS: Target date: 03/25/2024   Pt will be compliant and knowledgeable with initial HEP for improved comfort and carryover Baseline: initial HEP given  Goal status: MET Pt reports compliance 03/26/24  2.  Pt will self report bilateral knee pain no greater than 7/10 for improved comfort and functional ability Baseline: 10/10 at worst 04/09/2024: 6/10 Goal status: MET  LONG TERM GOALS: Target date: 06/19/2024    Pt will improve LEFS to no less than 50/80 as proxy for functional improvement with home ADLs and higher level community activity Baseline: 34/80 04/09/2024: 36/80 Goal status: IN PROGRESS   2.  Pt will self report bilateral knee pain no greater than 3/10 for improved comfort and functional ability Baseline: 10/10 at worst 04/09/2024: 6/10 04/28/2024: 5/10 Goal status: IN PROGRESS   3.  Pt will increase 30 Second Sit to Stand rep count to no less than 12 reps for improved balance, strength, and functional mobility Baseline: 8 reps 04/09/2024: 12 reps Goal status: MET   4.  Pt will improve all LE MMT to no less than 4/5 for all tested motions for improved knee  stabilization and decreased pain Baseline: see MMT chart Goal status: MET  5.  Pt will be able to squat  with hands to floor without increase in bilateral knee pain for improved comfort and functional ability  Baseline: unable 04/28/2024: slight loss of balance, able to pick up small box Goal status: PARTIALLY MET    6.  Pt will be able to ambulate 2 miles without increase in knee pain or any instance of knee buckling for improve comfort and function Baseline: 1 miles Goal status: INITIAL   PLAN:  PT FREQUENCY: 2x/week  PT DURATION: 8 weeks  PLANNED INTERVENTIONS: 97164- PT Re-evaluation, 97110-Therapeutic exercises, 97530- Therapeutic activity, [id number]- Neuromuscular re-education, 97535- Self Care, [id number]- Manual therapy, [id number]- Gait training, [id number]- Electrical stimulation (unattended), [id number]- Electrical stimulation (manual), 97016- Vasopneumatic device, Cryotherapy, and Moist heat  PLAN FOR NEXT SESSION: assess HEP response, quad and hip strengthening, progress functional lifting movements    Alm JAYSON Kingdom, PT 05/05/2024, 3:50 PM

## 2024-05-12 ENCOUNTER — Ambulatory Visit

## 2024-05-13 ENCOUNTER — Encounter

## 2024-05-13 NOTE — Therapy (Incomplete)
 OUTPATIENT PHYSICAL THERAPY TREATMENT    Patient Name: Cindy Phillips MRN: [id number] DOB:28-Oct-1990, 33 y.o., female Today's Date: 05/13/2024  END OF SESSION:      Past Medical History:  Diagnosis Date   Arthritis    Bipolar 1 disorder (HCC)    Bowel incontinence    Depression    Diabetes mellitus without complication (HCC)    Headache    migraines   Mental disability    Sleep apnea    Past Surgical History:  Procedure Laterality Date   ANTERIOR CRUCIATE LIGAMENT REPAIR Right 05/11/2022   Procedure: RIGHT KNEE ARTHROSCOPY; LATERAL MENISECTOMY; ANTERIOR CRUCIATE LIGAMENT (ACL) REPAIR; CHONDROPLASTY;  Surgeon: Shari Sieving, MD;  Location: [facility];  Service: Orthopedics;  Laterality: Right;   FOOT SURGERY     KNEE ARTHROSCOPY W/ ACL RECONSTRUCTION Left 10/12/2020   TOOTH EXTRACTION     Patient Active Problem List   Diagnosis Date Noted   Pain in left elbow 07/27/2020   Prediabetes 02/03/2019   Obstructive sleep apnea 04/23/2018   Other fatigue 11/26/2017   Shortness of breath on exertion 11/26/2017   Type 2 diabetes mellitus without complication, without long-term current use of insulin  (HCC) 11/26/2017   Other hyperlipidemia 11/26/2017   Vitamin D  deficiency 11/26/2017   Schizoaffective disorder, bipolar type (HCC) 09/14/2014   Intellectual disability 09/10/2014    PCP: Leigh Lung, MD  REFERRING PROVIDER: Shari Sieving, MD  REFERRING DIAG: Hx of ACL Reconstruction   THERAPY DIAG:  No diagnosis found.  Rationale for Evaluation and Treatment: Rehabilitation  ONSET DATE: Chronic  SUBJECTIVE:   SUBJECTIVE STATEMENT: Pt presents to PT with no current pain, but she did have a lot of pain over the weekend in her knees.   EVAL: Pt presents to PT with reports of chronic bilateral knee pain in presence of previous ACL repairs. She is well known to therapist and has been seen for both ACL recoveries in past. She currently has significant pain when standing  for long periods of time, or when walking/squatting. Denies referral of symptoms past knee, pain is sharp and along bilateral knee joint lines.   PERTINENT HISTORY: Bipolar disorder, Depression, intellectual disability, bilateral ACL reconstruction   PAIN:  Are you having pain?  Yes: NPRS scale: 7/10 Worst: 10/10 Pain location: bilateral inferior knee Pain description: sharp, sore Aggravating factors: standing, stairs,  Relieving factors: rest  PRECAUTIONS: None  RED FLAGS: None   WEIGHT BEARING RESTRICTIONS: No  FALLS:  Has patient fallen in last 6 months? -   LIVING ENVIRONMENT: Lives with: lives with their family Lives in: House/apartment Stairs: 4 STE - handrail  Has following equipment at home: None  OCCUPATION: Disability  PLOF: Independent with basic ADLs  PATIENT GOALS: decrease knee pain, improve strength and be able to work out  NEXT MD VISIT: PRN  OBJECTIVE:  Note: Objective measures were completed at Evaluation unless otherwise noted.  DIAGNOSTIC FINDINGS: N/A  PATIENT SURVEYS:  LEFS: 34/80  COGNITION: Overall cognitive status: Within functional limits for tasks assessed     SENSATION: WFL  POSTURE: rounded shoulders, forward head, increased lumbar lordosis, and large body habitus  PALPATION: TTP to distal quad  LOWER EXTREMITY ROM:  Active ROM Right eval Left eval  Hip flexion    Hip extension    Hip abduction    Hip adduction    Hip internal rotation    Hip external rotation    Knee flexion 114 118  Knee extension 0 5  Ankle dorsiflexion  Ankle plantarflexion    Ankle inversion    Ankle eversion     (Blank rows = not tested)  LOWER EXTREMITY MMT:  MMT Right eval Left eval Right 04/28/24 Left 04/28/24  Hip flexion 3+ 3+ 4 4  Hip extension      Hip abduction 3+ 3+ 4 4  Hip adduction      Hip internal rotation      Hip external rotation      Knee flexion 3+ 3+ 4 4  Knee extension 3+ 3+ 4 4  Ankle dorsiflexion       Ankle plantarflexion      Ankle inversion      Ankle eversion       (Blank rows = not tested)  LOWER EXTREMITY SPECIAL TESTS:  Knee special tests: Lachman Test: negative  FUNCTIONAL TESTS: 30 Second Sit to Stand: 8 reps 04/09/2024: 13 resps GAIT: Distance walked: 21ft Assistive device utilized: None Level of assistance: Complete Independence Comments: decreased gait speed, antalgia L   TREATMENT: [facility] Adult PT Treatment:                                                DATE: 05/14/24 Bike level 3 x 4 mins for functional activity tolerance Assessment of tests/measures, goals, and outcomes TRX squat 2x8 Cybex leg press BLE 3x8 60lb Standing hip abd 2x10 37.5lb Single leg RDL 2x10 15lb KB Sit to stand (squat to table) x 10 15lb KB Bike level 3 x 4 mins for functional activity tolerance Assessment of tests/measures, goals, and outcomes TRX squat 2x8 Cybex leg press BLE 3x8 60lb Standing hip abd 2x10 37.5lb Single leg RDL 2x10 15lb KB Sit to stand (squat to table) x 10 15lb KB Therapeutic Exercise: *** Manual Therapy: *** Neuromuscular re-ed: *** Therapeutic Activity: *** Modalities: *** Self Care: ***   [facility] Adult PT Treatment:                                                DATE: 05/05/24 Bike level 3 x 4 mins for functional activity tolerance Assessment of tests/measures, goals, and outcomes TRX squat 2x8 Cybex leg press BLE 3x8 60lb Standing hip abd 2x10 37.5lb Single leg RDL 2x10 15lb KB Sit to stand (squat to table) x 10 15lb KB  [facility] Adult PT Treatment:                                                DATE: 04/23/24 Therapeutic Activity: Bike level 3 x 4 mins for functional activity tolerance Assessment of tests/measures, goals, and outcomes Cybex leg press BLE 2x10 60lb Standing hip abd 2x10 37.5lb Step up fwd 10in x 10 ea Wall squats 3x10  PATIENT EDUCATION:  Education details: eval findings, LEFS, HEP, POC Person educated: Patient Education method:  Explanation, Demonstration, and Handouts Education comprehension: verbalized understanding and returned demonstration  HOME EXERCISE PROGRAM: Access Code: [email or web address] URL: [email or web address] Date: 03/04/2024 Prepared by: Alm Kingdom  Exercises - Supine Heel Slide  - 1 x daily - 7 x weekly - 2 sets - 10 reps - 5 sec hold -  Supine Quadricep Sets  - 1 x daily - 7 x weekly - 2 sets - 10 reps - 5 sec hold - Active Straight Leg Raise with Quad Set  - 1 x daily - 7 x weekly - 3-4 sets - 5 reps - Sidelying Hip Abduction  - 1 x daily - 7 x weekly - 3-4 sets - 5 reps  ASSESSMENT:  CLINICAL IMPRESSION: Pt was able to complete all prescribed exercises with no adverse effect. Continues to demo improved strength, we progressed exercises today to more compound and dynamic movements. She benefit from continued PT services, will continue to progress as able per POC.   EVAL: Patient is a 33 y.o. F who was seen today for physical therapy evaluation and treatment for bilateral knee pain and discomfort. Physical findings are consistent with injury/surgical hx along with bilateral LE weakness particularly in lateral hip and quad. Her LEFS score shows moderate disability in performance of home ADLs and community level activities. Pt would benefit from skilled PT services with specific focus on LE strengthening for improving movement and decreasing knee pain.   OBJECTIVE IMPAIRMENTS: Abnormal gait, decreased activity tolerance, decreased mobility, difficulty walking, decreased ROM, decreased strength, and pain  ACTIVITY LIMITATIONS: carrying, lifting, sitting, standing, squatting, stairs, transfers, and locomotion level  PARTICIPATION LIMITATIONS: meal prep, cleaning, driving, shopping, community activity, and yard work  PERSONAL FACTORS: Time since onset of injury/illness/exacerbation and 3+ comorbidities: Bipolar disorder, Depression, intellectual disability, bilateral ACL reconstruction  are  also affecting patient's functional outcome.   REHAB POTENTIAL: Good  CLINICAL DECISION MAKING: Evolving/moderate complexity  EVALUATION COMPLEXITY: Moderate   GOALS: Goals reviewed with patient? No  SHORT TERM GOALS: Target date: 03/25/2024   Pt will be compliant and knowledgeable with initial HEP for improved comfort and carryover Baseline: initial HEP given  Goal status: MET Pt reports compliance 03/26/24  2.  Pt will self report bilateral knee pain no greater than 7/10 for improved comfort and functional ability Baseline: 10/10 at worst 04/09/2024: 6/10 Goal status: MET  LONG TERM GOALS: Target date: 06/19/2024    Pt will improve LEFS to no less than 50/80 as proxy for functional improvement with home ADLs and higher level community activity Baseline: 34/80 04/09/2024: 36/80 Goal status: IN PROGRESS   2.  Pt will self report bilateral knee pain no greater than 3/10 for improved comfort and functional ability Baseline: 10/10 at worst 04/09/2024: 6/10 04/28/2024: 5/10 Goal status: IN PROGRESS   3.  Pt will increase 30 Second Sit to Stand rep count to no less than 12 reps for improved balance, strength, and functional mobility Baseline: 8 reps 04/09/2024: 12 reps Goal status: MET   4.  Pt will improve all LE MMT to no less than 4/5 for all tested motions for improved knee stabilization and decreased pain Baseline: see MMT chart Goal status: MET  5.  Pt will be able to squat with hands to floor without increase in bilateral knee pain for improved comfort and functional ability  Baseline: unable 04/28/2024: slight loss of balance, able to pick up small box Goal status: PARTIALLY MET    6.  Pt will be able to ambulate 2 miles without increase in knee pain or any instance of knee buckling for improve comfort and function Baseline: 1 miles Goal status: INITIAL   PLAN:  PT FREQUENCY: 2x/week  PT DURATION: 8 weeks  PLANNED INTERVENTIONS: 97164- PT Re-evaluation,  97110-Therapeutic exercises, 97530- Therapeutic activity, 97112- Neuromuscular re-education, 97535- Self Care, 02859- Manual therapy,  02883- Gait training, [id number]- Electrical stimulation (unattended), [id number]- Electrical stimulation (manual), 02983- Vasopneumatic device, Cryotherapy, and Moist heat  PLAN FOR NEXT SESSION: assess HEP response, quad and hip strengthening, progress functional lifting movements    [employer], PT 05/13/2024, 6:14 PM

## 2024-05-14 ENCOUNTER — Ambulatory Visit

## 2024-05-14 DIAGNOSIS — R2689 Other abnormalities of gait and mobility: Secondary | ICD-10-CM | POA: Diagnosis not present

## 2024-05-14 DIAGNOSIS — G8929 Other chronic pain: Secondary | ICD-10-CM

## 2024-05-14 DIAGNOSIS — M6281 Muscle weakness (generalized): Secondary | ICD-10-CM | POA: Diagnosis not present

## 2024-05-14 DIAGNOSIS — M25561 Pain in right knee: Secondary | ICD-10-CM | POA: Diagnosis not present

## 2024-05-14 DIAGNOSIS — M25562 Pain in left knee: Secondary | ICD-10-CM | POA: Diagnosis not present

## 2024-05-14 NOTE — Therapy (Signed)
 OUTPATIENT PHYSICAL THERAPY TREATMENT    Patient Name: Cindy Phillips MRN: [id number] DOB:07/10/1991, 33 y.o., female Today's Date: 05/14/2024  END OF SESSION:  PT End of Session - 05/14/24 1536     Visit Number 16    Number of Visits 30    Date for Recertification  06/19/24    Authorization Type [employer] Dual Complete    PT Start Time 1615    Activity Tolerance Patient tolerated treatment well    Behavior During Therapy [facility] for tasks assessed/performed          PT End of Session - 05/14/24 1536     Visit Number 16    Number of Visits 30    Date for Recertification  06/19/24    Authorization Type U[employer] Dual Complete    PT Start Time 1615    PT End Time 1655   Activity Tolerance Patient tolerated treatment well    Behavior During Therapy WFL for tasks assessed/performed           Past Medical History:  Diagnosis Date   Arthritis    Bipolar 1 disorder (HCC)    Bowel incontinence    Depression    Diabetes mellitus without complication (HCC)    Headache    migraines   Mental disability    Sleep apnea    Past Surgical History:  Procedure Laterality Date   ANTERIOR CRUCIATE LIGAMENT REPAIR Right 05/11/2022   Procedure: RIGHT KNEE ARTHROSCOPY; LATERAL MENISECTOMY; ANTERIOR CRUCIATE LIGAMENT (ACL) REPAIR; CHONDROPLASTY;  Surgeon: Shari Sieving, MD;  Location: [facility];  Service: Orthopedics;  Laterality: Right;   FOOT SURGERY     KNEE ARTHROSCOPY W/ ACL RECONSTRUCTION Left 10/12/2020   TOOTH EXTRACTION     Patient Active Problem List   Diagnosis Date Noted   Pain in left elbow 07/27/2020   Prediabetes 02/03/2019   Obstructive sleep apnea 04/23/2018   Other fatigue 11/26/2017   Shortness of breath on exertion 11/26/2017   Type 2 diabetes mellitus without complication, without long-term current use of insulin  (HCC) 11/26/2017   Other hyperlipidemia 11/26/2017   Vitamin D  deficiency 11/26/2017   Schizoaffective disorder, bipolar type (HCC) 09/14/2014   Intellectual  disability 09/10/2014    PCP: Leigh Lung, MD  REFERRING PROVIDER: Shari Sieving, MD  REFERRING DIAG: Hx of ACL Reconstruction   THERAPY DIAG:  Chronic pain of left knee  Chronic pain of right knee  Muscle weakness (generalized)  Rationale for Evaluation and Treatment: Rehabilitation  ONSET DATE: Chronic  SUBJECTIVE:   SUBJECTIVE STATEMENT: Pt presents to PT with reports of 6/10 pain in each knee after having to walk a mile to the bus stop earlier.   EVAL: Pt presents to PT with reports of chronic bilateral knee pain in presence of previous ACL repairs. She is well known to therapist and has been seen for both ACL recoveries in past. She currently has significant pain when standing for long periods of time, or when walking/squatting. Denies referral of symptoms past knee, pain is sharp and along bilateral knee joint lines.   PERTINENT HISTORY: Bipolar disorder, Depression, intellectual disability, bilateral ACL reconstruction   PAIN:  Are you having pain?  Yes: NPRS scale: 7/10 Worst: 10/10 Pain location: bilateral inferior knee Pain description: sharp, sore Aggravating factors: standing, stairs,  Relieving factors: rest  PRECAUTIONS: None  RED FLAGS: None   WEIGHT BEARING RESTRICTIONS: No  FALLS:  Has patient fallen in last 6 months? -   LIVING ENVIRONMENT: Lives with: lives with their  family Lives in: House/apartment Stairs: 4 STE - handrail  Has following equipment at home: None  OCCUPATION: Disability  PLOF: Independent with basic ADLs  PATIENT GOALS: decrease knee pain, improve strength and be able to work out  NEXT MD VISIT: PRN  OBJECTIVE:  Note: Objective measures were completed at Evaluation unless otherwise noted.  DIAGNOSTIC FINDINGS: N/A  PATIENT SURVEYS:  LEFS: 34/80  COGNITION: Overall cognitive status: Within functional limits for tasks assessed     SENSATION: WFL  POSTURE: rounded shoulders, forward head, increased lumbar  lordosis, and large body habitus  PALPATION: TTP to distal quad  LOWER EXTREMITY ROM:  Active ROM Right eval Left eval  Hip flexion    Hip extension    Hip abduction    Hip adduction    Hip internal rotation    Hip external rotation    Knee flexion 114 118  Knee extension 0 5  Ankle dorsiflexion    Ankle plantarflexion    Ankle inversion    Ankle eversion     (Blank rows = not tested)  LOWER EXTREMITY MMT:  MMT Right eval Left eval Right 04/28/24 Left 04/28/24  Hip flexion 3+ 3+ 4 4  Hip extension      Hip abduction 3+ 3+ 4 4  Hip adduction      Hip internal rotation      Hip external rotation      Knee flexion 3+ 3+ 4 4  Knee extension 3+ 3+ 4 4  Ankle dorsiflexion      Ankle plantarflexion      Ankle inversion      Ankle eversion       (Blank rows = not tested)  LOWER EXTREMITY SPECIAL TESTS:  Knee special tests: Lachman Test: negative  FUNCTIONAL TESTS: 30 Second Sit to Stand: 8 reps 04/09/2024: 13 reps GAIT: Distance walked: 63ft Assistive device utilized: None Level of assistance: Complete Independence Comments: decreased gait speed, antalgia L   TREATMENT: [facility] Adult PT Treatment:                                                DATE: 05/14/24 Bike level 3 x 4 mins for functional activity tolerance Standing hip abd 2x10 37.5lb TRX squat 2x8 Cybex leg press BLE 3x8 45lb Step up 8in x 10 ea BUE Seated hamstring curl 3x10 25lb Airrex squats 3x8 BUE support  [facility] Adult PT Treatment:                                                DATE: 05/05/24 Bike level 3 x 4 mins for functional activity tolerance Assessment of tests/measures, goals, and outcomes TRX squat 2x8 Cybex leg press BLE 3x8 60lb Standing hip abd 2x10 37.5lb Single leg RDL 2x10 15lb KB Sit to stand (squat to table) x 10 15lb KB  [facility] Adult PT Treatment:                                                DATE: 04/23/24 Therapeutic Activity: Bike level 3 x 4 mins for functional activity  tolerance Assessment  of tests/measures, goals, and outcomes Cybex leg press BLE 2x10 60lb Standing hip abd 2x10 37.5lb Step up fwd 10in x 10 ea Wall squats 3x10  [facility] Adult PT Treatment:                                                DATE: 04/21/24 Therapeutic Activity: Bike level 3 x 5 mins while gathering subjective and planning session with patient TRX squat 2x10 Omega knee extension 20# 2x10 BIL Omega knee flexion 25# 2x10 BIL Lateral step down 2x10 4in ea Omega leg press 2x10 80# Lateral walk RTB x 4 laps Monster walk x 4 laps RTB TRX lunge - x5 BIL  [facility] Adult PT Treatment:                                                DATE: 04/15/24 Bike level 3 x 4 mins while gathering subjective and planning session with patient TRX squat 2x10 Omega knee extension 20# 2x10 BIL Lateral step down 2x10 4in ea Omega leg press 3x8 80# Lateral walk RTB x 4 laps Monster walk x 4 laps RTB TRX lunge - increased pain   PATIENT EDUCATION:  Education details: eval findings, LEFS, HEP, POC Person educated: Patient Education method: Explanation, Demonstration, and Handouts Education comprehension: verbalized understanding and returned demonstration  HOME EXERCISE PROGRAM: Access Code: [email or web address] URL: [email or web address] Date: 03/04/2024 Prepared by: Alm Kingdom  Exercises - Supine Heel Slide  - 1 x daily - 7 x weekly - 2 sets - 10 reps - 5 sec hold - Supine Quadricep Sets  - 1 x daily - 7 x weekly - 2 sets - 10 reps - 5 sec hold - Active Straight Leg Raise with Quad Set  - 1 x daily - 7 x weekly - 3-4 sets - 5 reps - Sidelying Hip Abduction  - 1 x daily - 7 x weekly - 3-4 sets - 5 reps  ASSESSMENT:  CLINICAL IMPRESSION: Pt was able to complete all prescribed exercises with no adverse effect. We continued progress on LE strengthening despite increased pain. Continues to benefit from skilled PT, will continue per POC.  EVAL: Patient is a 33 y.o. F who was seen today for  physical therapy evaluation and treatment for bilateral knee pain and discomfort. Physical findings are consistent with injury/surgical hx along with bilateral LE weakness particularly in lateral hip and quad. Her LEFS score shows moderate disability in performance of home ADLs and community level activities. Pt would benefit from skilled PT services with specific focus on LE strengthening for improving movement and decreasing knee pain.   OBJECTIVE IMPAIRMENTS: Abnormal gait, decreased activity tolerance, decreased mobility, difficulty walking, decreased ROM, decreased strength, and pain  ACTIVITY LIMITATIONS: carrying, lifting, sitting, standing, squatting, stairs, transfers, and locomotion level  PARTICIPATION LIMITATIONS: meal prep, cleaning, driving, shopping, community activity, and yard work  PERSONAL FACTORS: Time since onset of injury/illness/exacerbation and 3+ comorbidities: Bipolar disorder, Depression, intellectual disability, bilateral ACL reconstruction  are also affecting patient's functional outcome.   REHAB POTENTIAL: Good  CLINICAL DECISION MAKING: Evolving/moderate complexity  EVALUATION COMPLEXITY: Moderate   GOALS: Goals reviewed with patient? No  SHORT TERM GOALS: Target date: 03/25/2024   Pt will be compliant and knowledgeable  with initial HEP for improved comfort and carryover Baseline: initial HEP given  Goal status: MET Pt reports compliance 03/26/24  2.  Pt will self report bilateral knee pain no greater than 7/10 for improved comfort and functional ability Baseline: 10/10 at worst 04/09/2024: 6/10 Goal status: MET  LONG TERM GOALS: Target date: 06/19/2024    Pt will improve LEFS to no less than 50/80 as proxy for functional improvement with home ADLs and higher level community activity Baseline: 34/80 04/09/2024: 36/80 Goal status: IN PROGRESS   2.  Pt will self report bilateral knee pain no greater than 3/10 for improved comfort and functional  ability Baseline: 10/10 at worst 04/09/2024: 6/10 04/28/2024: 5/10 Goal status: IN PROGRESS   3.  Pt will increase 30 Second Sit to Stand rep count to no less than 12 reps for improved balance, strength, and functional mobility Baseline: 8 reps 04/09/2024: 12 reps Goal status: MET   4.  Pt will improve all LE MMT to no less than 4/5 for all tested motions for improved knee stabilization and decreased pain Baseline: see MMT chart Goal status: MET  5.  Pt will be able to squat with hands to floor without increase in bilateral knee pain for improved comfort and functional ability  Baseline: unable 04/28/2024: slight loss of balance, able to pick up small box Goal status: PARTIALLY MET    6.  Pt will be able to ambulate 2 miles without increase in knee pain or any instance of knee buckling for improve comfort and function Baseline: 1 miles Goal status: INITIAL   PLAN:  PT FREQUENCY: 2x/week  PT DURATION: 8 weeks  PLANNED INTERVENTIONS: 97164- PT Re-evaluation, 97110-Therapeutic exercises, 97530- Therapeutic activity, [id number]- Neuromuscular re-education, 97535- Self Care, [id number]- Manual therapy, [id number]- Gait training, [id number]- Electrical stimulation (unattended), [id number]- Electrical stimulation (manual), 97016- Vasopneumatic device, Cryotherapy, and Moist heat  PLAN FOR NEXT SESSION: assess HEP response, quad and hip strengthening, progress functional lifting movements    Alm JAYSON Kingdom, PT 05/14/2024, 3:36 PM

## 2024-05-19 ENCOUNTER — Ambulatory Visit

## 2024-05-19 DIAGNOSIS — G8929 Other chronic pain: Secondary | ICD-10-CM | POA: Diagnosis not present

## 2024-05-19 DIAGNOSIS — M25561 Pain in right knee: Secondary | ICD-10-CM | POA: Diagnosis not present

## 2024-05-19 DIAGNOSIS — M6281 Muscle weakness (generalized): Secondary | ICD-10-CM

## 2024-05-19 DIAGNOSIS — M25562 Pain in left knee: Secondary | ICD-10-CM | POA: Diagnosis not present

## 2024-05-19 DIAGNOSIS — R2689 Other abnormalities of gait and mobility: Secondary | ICD-10-CM | POA: Diagnosis not present

## 2024-05-19 NOTE — Therapy (Unsigned)
 OUTPATIENT PHYSICAL THERAPY TREATMENT    Patient Name: LAUNA GOEDKEN MRN: [id number] DOB:August 05, 1991, 33 y.o., female Today's Date: 05/19/2024  END OF SESSION:  PT End of Session - 05/19/24 1410     Visit Number 17    Number of Visits 30    Date for Recertification  06/19/24    Authorization Type UHC Dual Complete    PT Start Time 1445    Activity Tolerance Patient tolerated treatment well    Behavior During Therapy WFL for tasks assessed/performed          Past Medical History:  Diagnosis Date   Arthritis    Bipolar 1 disorder (HCC)    Bowel incontinence    Depression    Diabetes mellitus without complication (HCC)    Headache    migraines   Mental disability    Sleep apnea    Past Surgical History:  Procedure Laterality Date   ANTERIOR CRUCIATE LIGAMENT REPAIR Right 05/11/2022   Procedure: RIGHT KNEE ARTHROSCOPY; LATERAL MENISECTOMY; ANTERIOR CRUCIATE LIGAMENT (ACL) REPAIR; CHONDROPLASTY;  Surgeon: Shari Sieving, MD;  Location: [facility];  Service: Orthopedics;  Laterality: Right;   FOOT SURGERY     KNEE ARTHROSCOPY W/ ACL RECONSTRUCTION Left 10/12/2020   TOOTH EXTRACTION     Patient Active Problem List   Diagnosis Date Noted   Pain in left elbow 07/27/2020   Prediabetes 02/03/2019   Obstructive sleep apnea 04/23/2018   Other fatigue 11/26/2017   Shortness of breath on exertion 11/26/2017   Type 2 diabetes mellitus without complication, without long-term current use of insulin  (HCC) 11/26/2017   Other hyperlipidemia 11/26/2017   Vitamin D  deficiency 11/26/2017   Schizoaffective disorder, bipolar type (HCC) 09/14/2014   Intellectual disability 09/10/2014    PCP: Leigh Lung, MD  REFERRING PROVIDER: Shari Sieving, MD  REFERRING DIAG: Hx of ACL Reconstruction   THERAPY DIAG:  Chronic pain of left knee  Chronic pain of right knee  Muscle weakness (generalized)  Rationale for Evaluation and Treatment: Rehabilitation  ONSET DATE:  Chronic  SUBJECTIVE:   SUBJECTIVE STATEMENT: Pt presents to PT with no current pain. Has bene compliant with HEP.   EVAL: Pt presents to PT with reports of chronic bilateral knee pain in presence of previous ACL repairs. She is well known to therapist and has been seen for both ACL recoveries in past. She currently has significant pain when standing for long periods of time, or when walking/squatting. Denies referral of symptoms past knee, pain is sharp and along bilateral knee joint lines.   PERTINENT HISTORY: Bipolar disorder, Depression, intellectual disability, bilateral ACL reconstruction   PAIN:  Are you having pain?  Yes: NPRS scale: 7/10 Worst: 10/10 Pain location: bilateral inferior knee Pain description: sharp, sore Aggravating factors: standing, stairs,  Relieving factors: rest  PRECAUTIONS: None  RED FLAGS: None   WEIGHT BEARING RESTRICTIONS: No  FALLS:  Has patient fallen in last 6 months? -   LIVING ENVIRONMENT: Lives with: lives with their family Lives in: House/apartment Stairs: 4 STE - handrail  Has following equipment at home: None  OCCUPATION: Disability  PLOF: Independent with basic ADLs  PATIENT GOALS: decrease knee pain, improve strength and be able to work out  NEXT MD VISIT: PRN  OBJECTIVE:  Note: Objective measures were completed at Evaluation unless otherwise noted.  DIAGNOSTIC FINDINGS: N/A  PATIENT SURVEYS:  LEFS: 34/80  COGNITION: Overall cognitive status: Within functional limits for tasks assessed     SENSATION: WFL  POSTURE: rounded shoulders, forward  head, increased lumbar lordosis, and large body habitus  PALPATION: TTP to distal quad  LOWER EXTREMITY ROM:  Active ROM Right eval Left eval  Hip flexion    Hip extension    Hip abduction    Hip adduction    Hip internal rotation    Hip external rotation    Knee flexion 114 118  Knee extension 0 5  Ankle dorsiflexion    Ankle plantarflexion    Ankle inversion     Ankle eversion     (Blank rows = not tested)  LOWER EXTREMITY MMT:  MMT Right eval Left eval Right 04/28/24 Left 04/28/24  Hip flexion 3+ 3+ 4 4  Hip extension      Hip abduction 3+ 3+ 4 4  Hip adduction      Hip internal rotation      Hip external rotation      Knee flexion 3+ 3+ 4 4  Knee extension 3+ 3+ 4 4  Ankle dorsiflexion      Ankle plantarflexion      Ankle inversion      Ankle eversion       (Blank rows = not tested)  LOWER EXTREMITY SPECIAL TESTS:  Knee special tests: Lachman Test: negative  FUNCTIONAL TESTS: 30 Second Sit to Stand: 8 reps 04/09/2024: 13 reps GAIT: Distance walked: 28ft Assistive device utilized: None Level of assistance: Complete Independence Comments: decreased gait speed, antalgia L   TREATMENT: [facility] Adult PT Treatment:                                                DATE: 05/19/24 Bike level 3 x 4 mins for functional activity tolerance Lateral walk GTB x 4 laps at [employer] walk GTB x 56ft GTB Standing hip abd 2x10 30lb TRX squat 3x5 Semi tandem on foam x 30 ea Seated knee ext 2x10 20#  PATIENT EDUCATION:  Education details: eval findings, LEFS, HEP, POC Person educated: Patient Education method: Explanation, Demonstration, and Handouts Education comprehension: verbalized understanding and returned demonstration  HOME EXERCISE PROGRAM: Access Code: [email or web address] URL: [email or web address] Date: 03/04/2024 Prepared by: Alm Kingdom  Exercises - Supine Heel Slide  - 1 x daily - 7 x weekly - 2 sets - 10 reps - 5 sec hold - Supine Quadricep Sets  - 1 x daily - 7 x weekly - 2 sets - 10 reps - 5 sec hold - Active Straight Leg Raise with Quad Set  - 1 x daily - 7 x weekly - 3-4 sets - 5 reps - Sidelying Hip Abduction  - 1 x daily - 7 x weekly - 3-4 sets - 5 reps  ASSESSMENT:  CLINICAL IMPRESSION: Pt was able to complete all prescribed exercises with no adverse effect. We continued progress on LE strengthening  despite increased pain. Continues to benefit from skilled PT, will continue per POC.  EVAL: Patient is a 33 y.o. F who was seen today for physical therapy evaluation and treatment for bilateral knee pain and discomfort. Physical findings are consistent with injury/surgical hx along with bilateral LE weakness particularly in lateral hip and quad. Her LEFS score shows moderate disability in performance of home ADLs and community level activities. Pt would benefit from skilled PT services with specific focus on LE strengthening for improving movement and decreasing knee pain.   OBJECTIVE IMPAIRMENTS: Abnormal gait, decreased activity  tolerance, decreased mobility, difficulty walking, decreased ROM, decreased strength, and pain  ACTIVITY LIMITATIONS: carrying, lifting, sitting, standing, squatting, stairs, transfers, and locomotion level  PARTICIPATION LIMITATIONS: meal prep, cleaning, driving, shopping, community activity, and yard work  PERSONAL FACTORS: Time since onset of injury/illness/exacerbation and 3+ comorbidities: Bipolar disorder, Depression, intellectual disability, bilateral ACL reconstruction  are also affecting patient's functional outcome.   REHAB POTENTIAL: Good  CLINICAL DECISION MAKING: Evolving/moderate complexity  EVALUATION COMPLEXITY: Moderate   GOALS: Goals reviewed with patient? No  SHORT TERM GOALS: Target date: 03/25/2024   Pt will be compliant and knowledgeable with initial HEP for improved comfort and carryover Baseline: initial HEP given  Goal status: MET Pt reports compliance 03/26/24  2.  Pt will self report bilateral knee pain no greater than 7/10 for improved comfort and functional ability Baseline: 10/10 at worst 04/09/2024: 6/10 Goal status: MET  LONG TERM GOALS: Target date: 06/19/2024    Pt will improve LEFS to no less than 50/80 as proxy for functional improvement with home ADLs and higher level community activity Baseline: 34/80 04/09/2024:  36/80 Goal status: IN PROGRESS   2.  Pt will self report bilateral knee pain no greater than 3/10 for improved comfort and functional ability Baseline: 10/10 at worst 04/09/2024: 6/10 04/28/2024: 5/10 Goal status: IN PROGRESS   3.  Pt will increase 30 Second Sit to Stand rep count to no less than 12 reps for improved balance, strength, and functional mobility Baseline: 8 reps 04/09/2024: 12 reps Goal status: MET   4.  Pt will improve all LE MMT to no less than 4/5 for all tested motions for improved knee stabilization and decreased pain Baseline: see MMT chart Goal status: MET  5.  Pt will be able to squat with hands to floor without increase in bilateral knee pain for improved comfort and functional ability  Baseline: unable 04/28/2024: slight loss of balance, able to pick up small box Goal status: PARTIALLY MET    6.  Pt will be able to ambulate 2 miles without increase in knee pain or any instance of knee buckling for improve comfort and function Baseline: 1 miles Goal status: INITIAL   PLAN:  PT FREQUENCY: 2x/week  PT DURATION: 8 weeks  PLANNED INTERVENTIONS: 97164- PT Re-evaluation, 97110-Therapeutic exercises, 97530- Therapeutic activity, [id number]- Neuromuscular re-education, 97535- Self Care, 02859- Manual therapy, [id number]- Gait training, H9716- Electrical stimulation (unattended), [id number]- Electrical stimulation (manual), 97016- Vasopneumatic device, Cryotherapy, and Moist heat  PLAN FOR NEXT SESSION: assess HEP response, quad and hip strengthening, progress functional lifting movements    Alm JAYSON Kingdom, PT 05/19/2024, 2:10 PM

## 2024-05-20 ENCOUNTER — Encounter: Payer: Self-pay | Admitting: Podiatry

## 2024-05-20 ENCOUNTER — Ambulatory Visit (INDEPENDENT_AMBULATORY_CARE_PROVIDER_SITE_OTHER): Admitting: Podiatry

## 2024-05-20 ENCOUNTER — Ambulatory Visit (INDEPENDENT_AMBULATORY_CARE_PROVIDER_SITE_OTHER)

## 2024-05-20 DIAGNOSIS — M7752 Other enthesopathy of left foot: Secondary | ICD-10-CM | POA: Diagnosis not present

## 2024-05-20 DIAGNOSIS — M7672 Peroneal tendinitis, left leg: Secondary | ICD-10-CM | POA: Diagnosis not present

## 2024-05-20 DIAGNOSIS — M7751 Other enthesopathy of right foot: Secondary | ICD-10-CM | POA: Diagnosis not present

## 2024-05-20 DIAGNOSIS — M7671 Peroneal tendinitis, right leg: Secondary | ICD-10-CM

## 2024-05-20 DIAGNOSIS — M7750 Other enthesopathy of unspecified foot: Secondary | ICD-10-CM

## 2024-05-20 MED ORDER — MELOXICAM 15 MG PO TABS: 15.0000 mg | ORAL_TABLET | Freq: Every day | ORAL | 0 refills | Status: DC

## 2024-05-20 NOTE — Patient Instructions (Signed)
 Peroneal Tendinopathy Rehab Ask your health care provider which exercises are safe for you. Do exercises exactly as told by your health care provider and adjust them as directed. It is normal to feel mild stretching, pulling, tightness, or discomfort as you do these exercises. Stop right away if you feel sudden pain or your pain gets worse. Do not begin these exercises until told by your health care provider. Stretching and range-of-motion exercises These exercises warm up your muscles and joints. They can help improve the movement and flexibility of your ankle. They may also help to relieve pain and stiffness. Gastrocnemius and soleus stretch, standing This is an exercise in which you stand on a step and use your body weight to stretch your calf muscles. To do this exercise: Stand on the edge of a step on the ball of your left / right foot. The ball of your foot is on the walking surface, right under your toes. Keep your other foot firmly on the same step. Hold on to the wall, a railing, or a chair for balance. Slowly lift your other foot, allowing your body weight to press your left / right heel down over the edge of the step. You should feel a stretch in your left / right calf (gastrocnemius and soleus). Hold this position for __________ seconds. Return both feet to the step. Repeat this exercise with a slight bend in your left / right knee. Repeat __________ times with your left / right knee straight and __________ times with your left / right knee bent. Complete this exercise __________ times a day. Strengthening exercises These exercises build strength and endurance in your foot and ankle. Endurance is the ability to use your muscles for a long time, even after they get tired. Ankle dorsiflexion with band  Secure a rubber exercise band or tube to an object, such as a table leg, that will not move when the band is pulled. Secure the other end of the band around your left / right foot. Sit on  the floor. Face the object with your left / right leg extended. The band or tube should be slightly tense when your foot is relaxed. Slowly flex your left / right ankle and toes to bring your foot toward you (dorsiflexion). Hold this position for __________ seconds. Let the band or tube slowly pull your foot back to the starting position. Repeat __________ times. Complete this exercise __________ times a day. Ankle eversion  Sit on the floor with your legs straight out in front of you. Loop a rubber exercise band or tube around the ball of your left / right foot. The ball of your foot is on the walking surface, right under your toes. Hold the ends of the band in your hands. You can also secure the band to a stable object. The band or tube should be slightly tense when your foot is relaxed. Slowly push your foot outward, away from your other leg (eversion). Hold this position for __________ seconds. Slowly return your foot to the starting position. Repeat __________ times. Complete this exercise __________ times a day. Plantar flexion, standing This exercise is sometimes called a standing heel raise. Stand with your feet shoulder-width apart. Place your hands on a wall or table to steady yourself as needed. Try not to use it for support. Keep your weight spread evenly over the width of your feet while you slowly rise up on your toes (plantar flexion). If told by your health care provider: Shift your weight  toward your left / right leg until you feel challenged. Stand on your left / right leg only. Hold this position for __________ seconds. Repeat __________ times. Complete this exercise __________ times a day. Single leg stand  Without shoes, stand near a railing or in a doorway. You may hold on to the railing or doorframe as needed. Stand on your left / right foot. Keep your big toe down on the floor and try to keep your arch lifted. Do not roll to the outside of your foot. If this  exercise is too easy, you can try it with your eyes closed or while standing on a pillow. Hold this position for __________ seconds. Repeat __________ times. Complete this exercise __________ times a day. This information is not intended to replace advice given to you by your health care provider. Make sure you discuss any questions you have with your health care provider. Document Revised: 11/30/2021 Document Reviewed: 11/30/2021 Elsevier Patient Education  2024 [employer].

## 2024-05-20 NOTE — Progress Notes (Signed)
  Subjective:  Patient ID: Cindy Phillips, female    DOB: 1991/03/22,   MRN: [id number]  Chief Complaint  Patient presents with   Foot Pain    My ankles hurt.  Sometime my feet hurt on the bottom too.    33 y.o. female presents for concern of bilateral ankle pain that has been ongoing for over a year. She denies any injury. States painful when walking. Denies any treatments. She does take tyelnol that does help.  . Denies any other pedal complaints. Denies n/v/f/c.   Past Medical History:  Diagnosis Date   Arthritis    Bipolar 1 disorder (HCC)    Bowel incontinence    Depression    Diabetes mellitus without complication (HCC)    Headache    migraines   Mental disability    Sleep apnea     Objective:  Physical Exam: Vascular: DP/PT pulses 2/4 bilateral. CFT <3 seconds. Normal hair growth on digits.   Skin. No lacerations or abrasions bilateral feet.  Musculoskeletal: MMT 5/5 bilateral lower extremities in DF, PF, Inversion and Eversion. Deceased ROM in DF of ankle joint. Tender to palpation overall about the foot but mostly around peroneal tendon. Pain with eversion and DF bilateral. Edema noted.  Neurological: Sensation intact to light touch.   Assessment:   1. Peroneal tendonitis, left   2. Peroneal tendonitis, right      Plan:  Patient was evaluated and treated and all questions answered. X-rays reviewed and discussed with patient. No acute fractures or dislocations noted.  Discussed peroneal tendinitis and treatment options at length with patient Discussed stretching exercises and provided handout. Prescription for meloxicam provided Dispensed Tri-Lock ankle brace. Discussed that if the symptoms do not improve can consider PT/MRI. Patient to return in 6 to 8 weeks or sooner if symptoms fail to improve or worsen.   Asberry Failing, DPM

## 2024-05-21 ENCOUNTER — Ambulatory Visit: Attending: Orthopedic Surgery

## 2024-05-21 DIAGNOSIS — R2689 Other abnormalities of gait and mobility: Secondary | ICD-10-CM | POA: Insufficient documentation

## 2024-05-21 DIAGNOSIS — M25662 Stiffness of left knee, not elsewhere classified: Secondary | ICD-10-CM | POA: Insufficient documentation

## 2024-05-21 DIAGNOSIS — G8929 Other chronic pain: Secondary | ICD-10-CM | POA: Insufficient documentation

## 2024-05-21 DIAGNOSIS — M25562 Pain in left knee: Secondary | ICD-10-CM | POA: Diagnosis present

## 2024-05-21 DIAGNOSIS — R600 Localized edema: Secondary | ICD-10-CM | POA: Insufficient documentation

## 2024-05-21 DIAGNOSIS — M25571 Pain in right ankle and joints of right foot: Secondary | ICD-10-CM | POA: Insufficient documentation

## 2024-05-21 DIAGNOSIS — M6281 Muscle weakness (generalized): Secondary | ICD-10-CM | POA: Diagnosis present

## 2024-05-21 DIAGNOSIS — M25561 Pain in right knee: Secondary | ICD-10-CM | POA: Insufficient documentation

## 2024-05-21 DIAGNOSIS — M25572 Pain in left ankle and joints of left foot: Secondary | ICD-10-CM | POA: Diagnosis present

## 2024-05-21 NOTE — Therapy (Signed)
 OUTPATIENT PHYSICAL THERAPY TREATMENT    Patient Name: Cindy Phillips MRN: [id number] DOB:07/01/1991, 33 y.o., female Today's Date: 05/21/2024  END OF SESSION:  PT End of Session - 05/21/24 1204     Visit Number 18    Number of Visits 30    Date for Recertification  06/19/24    Authorization Type [employer] Dual Complete    PT Start Time 1148    PT Stop Time 1230    PT Time Calculation (min) 42 min    Activity Tolerance Patient tolerated treatment well    Behavior During Therapy WFL for tasks assessed/performed           Past Medical History:  Diagnosis Date   Arthritis    Bipolar 1 disorder (HCC)    Bowel incontinence    Depression    Diabetes mellitus without complication (HCC)    Headache    migraines   Mental disability    Sleep apnea    Past Surgical History:  Procedure Laterality Date   ANTERIOR CRUCIATE LIGAMENT REPAIR Right 05/11/2022   Procedure: RIGHT KNEE ARTHROSCOPY; LATERAL MENISECTOMY; ANTERIOR CRUCIATE LIGAMENT (ACL) REPAIR; CHONDROPLASTY;  Surgeon: Shari Sieving, MD;  Location: [facility];  Service: Orthopedics;  Laterality: Right;   FOOT SURGERY     KNEE ARTHROSCOPY W/ ACL RECONSTRUCTION Left 10/12/2020   TOOTH EXTRACTION     Patient Active Problem List   Diagnosis Date Noted   Pain in left elbow 07/27/2020   Prediabetes 02/03/2019   Obstructive sleep apnea 04/23/2018   Other fatigue 11/26/2017   Shortness of breath on exertion 11/26/2017   Type 2 diabetes mellitus without complication, without long-term current use of insulin  (HCC) 11/26/2017   Other hyperlipidemia 11/26/2017   Vitamin D  deficiency 11/26/2017   Schizoaffective disorder, bipolar type (HCC) 09/14/2014   Intellectual disability 09/10/2014    PCP: Leigh Lung, MD  REFERRING PROVIDER: Shari Sieving, MD  REFERRING DIAG: Hx of ACL Reconstruction   THERAPY DIAG:  Chronic pain of left knee  Chronic pain of right knee  Muscle weakness (generalized)  Other abnormalities of  gait and mobility  Rationale for Evaluation and Treatment: Rehabilitation  ONSET DATE: Chronic  SUBJECTIVE:   SUBJECTIVE STATEMENT: Pt reports her knees are not bothering her today.  EVAL: Pt presents to PT with reports of chronic bilateral knee pain in presence of previous ACL repairs. She is well known to therapist and has been seen for both ACL recoveries in past. She currently has significant pain when standing for long periods of time, or when walking/squatting. Denies referral of symptoms past knee, pain is sharp and along bilateral knee joint lines.   PERTINENT HISTORY: Bipolar disorder, Depression, intellectual disability, bilateral ACL reconstruction   PAIN:  Are you having pain?  Yes: NPRS scale: currently 0/10 Worst: 10/10 Pain location: bilateral inferior knee Pain description: sharp, sore Aggravating factors: standing, stairs,  Relieving factors: rest  PRECAUTIONS: None  RED FLAGS: None   WEIGHT BEARING RESTRICTIONS: No  FALLS:  Has patient fallen in last 6 months? -   LIVING ENVIRONMENT: Lives with: lives with their family Lives in: House/apartment Stairs: 4 STE - handrail  Has following equipment at home: None  OCCUPATION: Disability  PLOF: Independent with basic ADLs  PATIENT GOALS: decrease knee pain, improve strength and be able to work out  NEXT MD VISIT: PRN  OBJECTIVE:  Note: Objective measures were completed at Evaluation unless otherwise noted.  DIAGNOSTIC FINDINGS: N/A  PATIENT SURVEYS:  LEFS: 34/80  COGNITION:  Overall cognitive status: Within functional limits for tasks assessed     SENSATION: WFL  POSTURE: rounded shoulders, forward head, increased lumbar lordosis, and large body habitus  PALPATION: TTP to distal quad  LOWER EXTREMITY ROM:  Active ROM Right eval Left eval  Hip flexion    Hip extension    Hip abduction    Hip adduction    Hip internal rotation    Hip external rotation    Knee flexion 114 118   Knee extension 0 5  Ankle dorsiflexion    Ankle plantarflexion    Ankle inversion    Ankle eversion     (Blank rows = not tested)  LOWER EXTREMITY MMT:  MMT Right eval Left eval Right 04/28/24 Left 04/28/24  Hip flexion 3+ 3+ 4 4  Hip extension      Hip abduction 3+ 3+ 4 4  Hip adduction      Hip internal rotation      Hip external rotation      Knee flexion 3+ 3+ 4 4  Knee extension 3+ 3+ 4 4  Ankle dorsiflexion      Ankle plantarflexion      Ankle inversion      Ankle eversion       (Blank rows = not tested)  LOWER EXTREMITY SPECIAL TESTS:  Knee special tests: Lachman Test: negative  FUNCTIONAL TESTS: 30 Second Sit to Stand: 8 reps 04/09/2024: 13 reps GAIT: Distance walked: 21ft Assistive device utilized: None Level of assistance: Complete Independence Comments: decreased gait speed, antalgia L  TREATMENT: [facility] Adult PT Treatment:                                                DATE: 05/21/24 Bike level 3 x 4 mins for functional activity tolerance Lateral walk GTB x 8 laps at [employer] walk GTB 4 x 86ft GTB Standing hip abd 2x10 30lb TRX squat 2x10 Semi tandem, SL standing, marching on foam x 30 ea Seated knee ext 3x10 25#  [facility] Adult PT Treatment:                                                DATE: 05/19/24 Bike level 3 x 4 mins for functional activity tolerance Lateral walk GTB x 4 laps at [employer] walk GTB x 42ft GTB Standing hip abd 2x10 30lb TRX squat 3x5 Semi tandem on foam x 30 ea Seated knee ext 2x10 20#  PATIENT EDUCATION:  Education details: eval findings, LEFS, HEP, POC Person educated: Patient Education method: Explanation, Demonstration, and Handouts Education comprehension: verbalized understanding and returned demonstration  HOME EXERCISE PROGRAM: Access Code: [email or web address] URL: [email or web address] Date: 03/04/2024 Prepared by: Alm Kingdom  Exercises - Supine Heel Slide  - 1 x daily - 7 x weekly - 2 sets -  10 reps - 5 sec hold - Supine Quadricep Sets  - 1 x daily - 7 x weekly - 2 sets - 10 reps - 5 sec hold - Active Straight Leg Raise with Quad Set  - 1 x daily - 7 x weekly - 3-4 sets - 5 reps - Sidelying Hip Abduction  - 1 x daily - 7 x weekly - 3-4 sets - 5 reps  ASSESSMENT:  CLINICAL IMPRESSION: Pt presents to PT wearing ASO style ankle braces provided by Dr. Joya yesterday. PT was continued for LE strengthening. Pt completed all prescribed exercises with no adverse effect. Pt reports her knees are not bothering her today. Continues to benefit from skilled PT, will continue per POC.  EVAL: Patient is a 33 y.o. F who was seen today for physical therapy evaluation and treatment for bilateral knee pain and discomfort. Physical findings are consistent with injury/surgical hx along with bilateral LE weakness particularly in lateral hip and quad. Her LEFS score shows moderate disability in performance of home ADLs and community level activities. Pt would benefit from skilled PT services with specific focus on LE strengthening for improving movement and decreasing knee pain.   OBJECTIVE IMPAIRMENTS: Abnormal gait, decreased activity tolerance, decreased mobility, difficulty walking, decreased ROM, decreased strength, and pain  ACTIVITY LIMITATIONS: carrying, lifting, sitting, standing, squatting, stairs, transfers, and locomotion level  PARTICIPATION LIMITATIONS: meal prep, cleaning, driving, shopping, community activity, and yard work  PERSONAL FACTORS: Time since onset of injury/illness/exacerbation and 3+ comorbidities: Bipolar disorder, Depression, intellectual disability, bilateral ACL reconstruction  are also affecting patient's functional outcome.   REHAB POTENTIAL: Good  CLINICAL DECISION MAKING: Evolving/moderate complexity  EVALUATION COMPLEXITY: Moderate   GOALS: Goals reviewed with patient? No  SHORT TERM GOALS: Target date: 03/25/2024   Pt will be compliant and knowledgeable  with initial HEP for improved comfort and carryover Baseline: initial HEP given  Goal status: MET Pt reports compliance 03/26/24  2.  Pt will self report bilateral knee pain no greater than 7/10 for improved comfort and functional ability Baseline: 10/10 at worst 04/09/2024: 6/10 Goal status: MET  LONG TERM GOALS: Target date: 06/19/2024    Pt will improve LEFS to no less than 50/80 as proxy for functional improvement with home ADLs and higher level community activity Baseline: 34/80 04/09/2024: 36/80 Goal status: IN PROGRESS   2.  Pt will self report bilateral knee pain no greater than 3/10 for improved comfort and functional ability Baseline: 10/10 at worst 04/09/2024: 6/10 04/28/2024: 5/10 Goal status: IN PROGRESS   3.  Pt will increase 30 Second Sit to Stand rep count to no less than 12 reps for improved balance, strength, and functional mobility Baseline: 8 reps 04/09/2024: 12 reps Goal status: MET   4.  Pt will improve all LE MMT to no less than 4/5 for all tested motions for improved knee stabilization and decreased pain Baseline: see MMT chart Goal status: MET  5.  Pt will be able to squat with hands to floor without increase in bilateral knee pain for improved comfort and functional ability  Baseline: unable 04/28/2024: slight loss of balance, able to pick up small box Goal status: PARTIALLY MET    6.  Pt will be able to ambulate 2 miles without increase in knee pain or any instance of knee buckling for improve comfort and function Baseline: 1 miles Goal status: INITIAL   PLAN:  PT FREQUENCY: 2x/week  PT DURATION: 8 weeks  PLANNED INTERVENTIONS: [id number]- PT Re-evaluation, 97110-Therapeutic exercises, 97530- Therapeutic activity, [id number]- Neuromuscular re-education, 97535- Self Care, [id number]- Manual therapy, [id number]- Gait training, [id number]- Electrical stimulation (unattended), [id number]- Electrical stimulation (manual), 97016- Vasopneumatic device, Cryotherapy, and Moist heat  PLAN  FOR NEXT SESSION: assess HEP response, quad and hip strengthening, progress functional lifting movements    Christinamarie Tall, PT 05/21/2024, 1:25 PM

## 2024-05-22 ENCOUNTER — Telehealth: Payer: Self-pay | Admitting: Podiatry

## 2024-05-22 ENCOUNTER — Other Ambulatory Visit: Payer: Self-pay | Admitting: Podiatry

## 2024-05-22 DIAGNOSIS — M7671 Peroneal tendinitis, right leg: Secondary | ICD-10-CM

## 2024-05-22 DIAGNOSIS — M7672 Peroneal tendinitis, left leg: Secondary | ICD-10-CM

## 2024-05-22 NOTE — Telephone Encounter (Signed)
 Pt needs Dr. Sikora  to send a referral to [facility] contact number for [phone]

## 2024-05-25 DIAGNOSIS — G43719 Chronic migraine without aura, intractable, without status migrainosus: Secondary | ICD-10-CM | POA: Diagnosis not present

## 2024-05-25 DIAGNOSIS — M542 Cervicalgia: Secondary | ICD-10-CM | POA: Diagnosis not present

## 2024-05-25 DIAGNOSIS — M791 Myalgia, unspecified site: Secondary | ICD-10-CM | POA: Diagnosis not present

## 2024-05-25 NOTE — Therapy (Signed)
 OUTPATIENT PHYSICAL THERAPY TREATMENT    Patient Name: Cindy Phillips MRN: [id number] DOB:01/07/1991, 33 y.o., female Today's Date: 05/26/2024  END OF SESSION:  PT End of Session - 05/26/24 1605     Visit Number 19    Number of Visits 30    Date for Recertification  06/19/24    Authorization Type [employer] Dual Complete    PT Start Time 1552    PT Stop Time 1630    PT Time Calculation (min) 38 min    Activity Tolerance Patient tolerated treatment well    Behavior During Therapy WFL for tasks assessed/performed            Past Medical History:  Diagnosis Date   Arthritis    Bipolar 1 disorder (HCC)    Bowel incontinence    Depression    Diabetes mellitus without complication (HCC)    Headache    migraines   Mental disability    Sleep apnea    Past Surgical History:  Procedure Laterality Date   ANTERIOR CRUCIATE LIGAMENT REPAIR Right 05/11/2022   Procedure: RIGHT KNEE ARTHROSCOPY; LATERAL MENISECTOMY; ANTERIOR CRUCIATE LIGAMENT (ACL) REPAIR; CHONDROPLASTY;  Surgeon: Shari Sieving, MD;  Location: [facility];  Service: Orthopedics;  Laterality: Right;   FOOT SURGERY     KNEE ARTHROSCOPY W/ ACL RECONSTRUCTION Left 10/12/2020   TOOTH EXTRACTION     Patient Active Problem List   Diagnosis Date Noted   Pain in left elbow 07/27/2020   Prediabetes 02/03/2019   Obstructive sleep apnea 04/23/2018   Other fatigue 11/26/2017   Shortness of breath on exertion 11/26/2017   Type 2 diabetes mellitus without complication, without long-term current use of insulin  (HCC) 11/26/2017   Other hyperlipidemia 11/26/2017   Vitamin D  deficiency 11/26/2017   Schizoaffective disorder, bipolar type (HCC) 09/14/2014   Intellectual disability 09/10/2014    PCP: Leigh Lung, MD  REFERRING PROVIDER: Shari Sieving, MD  REFERRING DIAG: Hx of ACL Reconstruction   THERAPY DIAG:  Chronic pain of left knee  Chronic pain of right knee  Muscle weakness (generalized)  Other abnormalities of  gait and mobility  Localized edema  Stiffness of left knee, not elsewhere classified  Rationale for Evaluation and Treatment: Rehabilitation  ONSET DATE: Chronic  SUBJECTIVE:   SUBJECTIVE STATEMENT: Pt reports she is doing pretty good today. Pt's likes her new ankle braces.  EVAL: Pt presents to PT with reports of chronic bilateral knee pain in presence of previous ACL repairs. She is well known to therapist and has been seen for both ACL recoveries in past. She currently has significant pain when standing for long periods of time, or when walking/squatting. Denies referral of symptoms past knee, pain is sharp and along bilateral knee joint lines.   PERTINENT HISTORY: Bipolar disorder, Depression, intellectual disability, bilateral ACL reconstruction   PAIN:  Are you having pain?  Yes: NPRS scale: currently 0/10, not at the moment Worst: 10/10 Pain location: bilateral inferior knee Pain description: sharp, sore Aggravating factors: standing, stairs,  Relieving factors: rest  PRECAUTIONS: None  RED FLAGS: None   WEIGHT BEARING RESTRICTIONS: No  FALLS:  Has patient fallen in last 6 months? -   LIVING ENVIRONMENT: Lives with: lives with their family Lives in: House/apartment Stairs: 4 STE - handrail  Has following equipment at home: None  OCCUPATION: Disability  PLOF: Independent with basic ADLs  PATIENT GOALS: decrease knee pain, improve strength and be able to work out  NEXT MD VISIT: PRN  OBJECTIVE:  Note:  Objective measures were completed at Evaluation unless otherwise noted.  DIAGNOSTIC FINDINGS: N/A  PATIENT SURVEYS:  LEFS: 34/80  COGNITION: Overall cognitive status: Within functional limits for tasks assessed     SENSATION: WFL  POSTURE: rounded shoulders, forward head, increased lumbar lordosis, and large body habitus  PALPATION: TTP to distal quad  LOWER EXTREMITY ROM:  Active ROM Right eval Left eval  Hip flexion    Hip extension     Hip abduction    Hip adduction    Hip internal rotation    Hip external rotation    Knee flexion 114 118  Knee extension 0 5  Ankle dorsiflexion    Ankle plantarflexion    Ankle inversion    Ankle eversion     (Blank rows = not tested)  LOWER EXTREMITY MMT:  MMT Right eval Left eval Right 04/28/24 Left 04/28/24  Hip flexion 3+ 3+ 4 4  Hip extension      Hip abduction 3+ 3+ 4 4  Hip adduction      Hip internal rotation      Hip external rotation      Knee flexion 3+ 3+ 4 4  Knee extension 3+ 3+ 4 4  Ankle dorsiflexion      Ankle plantarflexion      Ankle inversion      Ankle eversion       (Blank rows = not tested)  LOWER EXTREMITY SPECIAL TESTS:  Knee special tests: Lachman Test: negative  FUNCTIONAL TESTS: 30 Second Sit to Stand: 8 reps 04/09/2024: 13 reps GAIT: Distance walked: 81ft Assistive device utilized: None Level of assistance: Complete Independence Comments: decreased gait speed, antalgia L  TREATMENT: [facility] Adult PT Treatment:                                                DATE: 05/26/24 Bike level 3 x 5 mins for functional activity tolerance Lateral walk BluTB  4 x 63ft Monster walk BluTB 4 x 9ft GTB Semi tandem, SL standing, marching on foam x 30 ea Seated knee ext 3x10 30# Seated knee flexion 3x10 40#  [facility] Adult PT Treatment:                                                DATE: 05/21/24 Bike level 3 x 4 mins for functional activity tolerance Lateral walk GTB x 8 laps at [employer] walk GTB 4 x 31ft GTB Standing hip abd 2x10 30lb TRX squat 2x10 Semi tandem, SL standing, marching on foam x 30 ea Seated knee ext 3x10 25#  PATIENT EDUCATION:  Education details: eval findings, LEFS, HEP, POC Person educated: Patient Education method: Explanation, Demonstration, and Handouts Education comprehension: verbalized understanding and returned demonstration  HOME EXERCISE PROGRAM: Access Code: [email or web address] URL:  [email or web address] Date: 03/04/2024 Prepared by: Alm Kingdom  Exercises - Supine Heel Slide  - 1 x daily - 7 x weekly - 2 sets - 10 reps - 5 sec hold - Supine Quadricep Sets  - 1 x daily - 7 x weekly - 2 sets - 10 reps - 5 sec hold - Active Straight Leg Raise with Quad Set  - 1 x daily - 7 x weekly - 3-4 sets -  5 reps - Sidelying Hip Abduction  - 1 x daily - 7 x weekly - 3-4 sets - 5 reps  ASSESSMENT:  CLINICAL IMPRESSION: Continued PT for bilat LE strengthening with gradual increase in demand. Pt tolerated prescribed exs in  PT today without adverse effects. Assessed 5xSTS with pt's performance in that activity improving indicating increased strength and minimized pain.    EVAL: Patient is a 33 y.o. F who was seen today for physical therapy evaluation and treatment for bilateral knee pain and discomfort. Physical findings are consistent with injury/surgical hx along with bilateral LE weakness particularly in lateral hip and quad. Her LEFS score shows moderate disability in performance of home ADLs and community level activities. Pt would benefit from skilled PT services with specific focus on LE strengthening for improving movement and decreasing knee pain.   OBJECTIVE IMPAIRMENTS: Abnormal gait, decreased activity tolerance, decreased mobility, difficulty walking, decreased ROM, decreased strength, and pain  ACTIVITY LIMITATIONS: carrying, lifting, sitting, standing, squatting, stairs, transfers, and locomotion level  PARTICIPATION LIMITATIONS: meal prep, cleaning, driving, shopping, community activity, and yard work  PERSONAL FACTORS: Time since onset of injury/illness/exacerbation and 3+ comorbidities: Bipolar disorder, Depression, intellectual disability, bilateral ACL reconstruction  are also affecting patient's functional outcome.   REHAB POTENTIAL: Good  CLINICAL DECISION MAKING: Evolving/moderate complexity  EVALUATION COMPLEXITY: Moderate   GOALS: Goals  reviewed with patient? No  SHORT TERM GOALS: Target date: 03/25/2024   Pt will be compliant and knowledgeable with initial HEP for improved comfort and carryover Baseline: initial HEP given  Goal status: MET Pt reports compliance 03/26/24  2.  Pt will self report bilateral knee pain no greater than 7/10 for improved comfort and functional ability Baseline: 10/10 at worst 04/09/2024: 6/10 Goal status: MET  LONG TERM GOALS: Target date: 06/19/2024    Pt will improve LEFS to no less than 50/80 as proxy for functional improvement with home ADLs and higher level community activity Baseline: 34/80 04/09/2024: 36/80 Goal status: IN PROGRESS   2.  Pt will self report bilateral knee pain no greater than 3/10 for improved comfort and functional ability Baseline: 10/10 at worst 04/09/2024: 6/10 04/28/2024: 5/10 Goal status: IN PROGRESS   3.  Pt will increase 30 Second Sit to Stand rep count to no less than 12 reps for improved balance, strength, and functional mobility Baseline: 8 reps 04/09/2024: 12 reps 05/26/2024: 14 reps Goal status: MET   4.  Pt will improve all LE MMT to no less than 4/5 for all tested motions for improved knee stabilization and decreased pain Baseline: see MMT chart Goal status: MET  5.  Pt will be able to squat with hands to floor without increase in bilateral knee pain for improved comfort and functional ability  Baseline: unable 04/28/2024: slight loss of balance, able to pick up small box Goal status: PARTIALLY MET    6.  Pt will be able to ambulate 2 miles without increase in knee pain or any instance of knee buckling for improve comfort and function Baseline: 1 miles Goal status: INITIAL   PLAN:  PT FREQUENCY: 2x/week  PT DURATION: 8 weeks  PLANNED INTERVENTIONS: 97164- PT Re-evaluation, 97110-Therapeutic exercises, 97530- Therapeutic activity, [id number]- Neuromuscular re-education, 97535- Self Care, [id number]- Manual therapy, [id number]- Gait training, [id number]- Electrical  stimulation (unattended), [id number]- Electrical stimulation (manual), 97016- Vasopneumatic device, Cryotherapy, and Moist heat  PLAN FOR NEXT SESSION: assess HEP response, quad and hip strengthening, progress functional lifting movements    [employer], PT 05/26/2024, 5:28  PM

## 2024-05-26 ENCOUNTER — Ambulatory Visit

## 2024-05-26 DIAGNOSIS — M6281 Muscle weakness (generalized): Secondary | ICD-10-CM

## 2024-05-26 DIAGNOSIS — R600 Localized edema: Secondary | ICD-10-CM

## 2024-05-26 DIAGNOSIS — M25562 Pain in left knee: Secondary | ICD-10-CM | POA: Diagnosis not present

## 2024-05-26 DIAGNOSIS — G8929 Other chronic pain: Secondary | ICD-10-CM

## 2024-05-26 DIAGNOSIS — R2689 Other abnormalities of gait and mobility: Secondary | ICD-10-CM

## 2024-05-26 DIAGNOSIS — M25662 Stiffness of left knee, not elsewhere classified: Secondary | ICD-10-CM

## 2024-05-28 ENCOUNTER — Ambulatory Visit

## 2024-05-28 DIAGNOSIS — M6281 Muscle weakness (generalized): Secondary | ICD-10-CM

## 2024-05-28 DIAGNOSIS — M25562 Pain in left knee: Secondary | ICD-10-CM | POA: Diagnosis not present

## 2024-05-28 DIAGNOSIS — G8929 Other chronic pain: Secondary | ICD-10-CM

## 2024-05-28 NOTE — Therapy (Signed)
 OUTPATIENT PHYSICAL THERAPY TREATMENT/PROGRESS NOTE  Progress Note Reporting Period 03/04/2024 to 05/29/2024  See note below for Objective Data and Assessment of Progress/Goals.       Patient Name: Cindy Phillips MRN: [id number] DOB:1990/12/15, 33 y.o., female Today's Date: 05/29/2024  END OF SESSION:  PT End of Session - 05/28/24 1353     Visit Number 20    Number of Visits 30    Date for Recertification  06/19/24    Authorization Type [employer] Dual Complete    PT Start Time 1400    PT Stop Time 1440    PT Time Calculation (min) 40 min    Activity Tolerance Patient tolerated treatment well    Behavior During Therapy WFL for tasks assessed/performed             Past Medical History:  Diagnosis Date   Arthritis    Bipolar 1 disorder (HCC)    Bowel incontinence    Depression    Diabetes mellitus without complication (HCC)    Headache    migraines   Mental disability    Sleep apnea    Past Surgical History:  Procedure Laterality Date   ANTERIOR CRUCIATE LIGAMENT REPAIR Right 05/11/2022   Procedure: RIGHT KNEE ARTHROSCOPY; LATERAL MENISECTOMY; ANTERIOR CRUCIATE LIGAMENT (ACL) REPAIR; CHONDROPLASTY;  Surgeon: Shari Sieving, MD;  Location: [facility];  Service: Orthopedics;  Laterality: Right;   FOOT SURGERY     KNEE ARTHROSCOPY W/ ACL RECONSTRUCTION Left 10/12/2020   TOOTH EXTRACTION     Patient Active Problem List   Diagnosis Date Noted   Pain in left elbow 07/27/2020   Prediabetes 02/03/2019   Obstructive sleep apnea 04/23/2018   Other fatigue 11/26/2017   Shortness of breath on exertion 11/26/2017   Type 2 diabetes mellitus without complication, without long-term current use of insulin  (HCC) 11/26/2017   Other hyperlipidemia 11/26/2017   Vitamin D  deficiency 11/26/2017   Schizoaffective disorder, bipolar type (HCC) 09/14/2014   Intellectual disability 09/10/2014    PCP: Leigh Lung, MD  REFERRING PROVIDER: Shari Sieving, MD  REFERRING DIAG: Hx of ACL  Reconstruction   THERAPY DIAG:  Chronic pain of left knee  Chronic pain of right knee  Muscle weakness (generalized)  Rationale for Evaluation and Treatment: Rehabilitation  ONSET DATE: Chronic  SUBJECTIVE:   SUBJECTIVE STATEMENT: Pt presents to PT with reports of bilateral ankle pain. Has a new referral to address this. Knees are feeling better today.   EVAL: Pt presents to PT with reports of chronic bilateral knee pain in presence of previous ACL repairs. She is well known to therapist and has been seen for both ACL recoveries in past. She currently has significant pain when standing for long periods of time, or when walking/squatting. Denies referral of symptoms past knee, pain is sharp and along bilateral knee joint lines.   PERTINENT HISTORY: Bipolar disorder, Depression, intellectual disability, bilateral ACL reconstruction   PAIN:  Are you having pain?  Yes: NPRS scale: currently 0/10, not at the moment Worst: 10/10 Pain location: bilateral inferior knee Pain description: sharp, sore Aggravating factors: standing, stairs,  Relieving factors: rest  PRECAUTIONS: None  RED FLAGS: None   WEIGHT BEARING RESTRICTIONS: No  FALLS:  Has patient fallen in last 6 months? -   LIVING ENVIRONMENT: Lives with: lives with their family Lives in: House/apartment Stairs: 4 STE - handrail  Has following equipment at home: None  OCCUPATION: Disability  PLOF: Independent with basic ADLs  PATIENT GOALS: decrease knee pain, improve strength  and be able to work out  NEXT MD VISIT: PRN  OBJECTIVE:  Note: Objective measures were completed at Evaluation unless otherwise noted.  DIAGNOSTIC FINDINGS: N/A  PATIENT SURVEYS:  LEFS: 34/80 05/28/2024: 54/80  COGNITION: Overall cognitive status: Within functional limits for tasks assessed     SENSATION: WFL  POSTURE: rounded shoulders, forward head, increased lumbar lordosis, and large body habitus  PALPATION: TTP to  distal quad  LOWER EXTREMITY ROM:  Active ROM Right eval Left eval  Hip flexion    Hip extension    Hip abduction    Hip adduction    Hip internal rotation    Hip external rotation    Knee flexion 114 118  Knee extension 0 5  Ankle dorsiflexion    Ankle plantarflexion    Ankle inversion    Ankle eversion     (Blank rows = not tested)  LOWER EXTREMITY MMT:  MMT Right eval Left eval Right 04/28/24 Left 04/28/24  Hip flexion 3+ 3+ 4 4  Hip extension      Hip abduction 3+ 3+ 4 4  Hip adduction      Hip internal rotation      Hip external rotation      Knee flexion 3+ 3+ 4 4  Knee extension 3+ 3+ 4 4  Ankle dorsiflexion      Ankle plantarflexion      Ankle inversion      Ankle eversion       (Blank rows = not tested)  LOWER EXTREMITY SPECIAL TESTS:  Knee special tests: Lachman Test: negative  FUNCTIONAL TESTS: 30 Second Sit to Stand: 8 reps 04/09/2024: 13 reps  GAIT: Distance walked: 53ft Assistive device utilized: None Level of assistance: Complete Independence Comments: decreased gait speed, antalgia L  TREATMENT: [facility] Adult PT Treatment:                                                DATE: 05/28/24 Bike level 3 x 4 mins for functional activity tolerance Standing hip abd 2x10 37.5# Leg press GTB 3x10 60# TRX squat 3x5 STS 2x10 15# KB Seated knee ext 3x10 30# Assessment of tests/measures  [facility] Adult PT Treatment:                                                DATE: 05/26/24 Bike level 3 x 5 mins for functional activity tolerance Lateral walk BluTB  4 x 48ft Monster walk BluTB 4 x 30ft GTB Semi tandem, SL standing, marching on foam x 30 ea Seated knee ext 3x10 30# Seated knee flexion 3x10 40#  [facility] Adult PT Treatment:                                                DATE: 05/21/24 Bike level 3 x 4 mins for functional activity tolerance Lateral walk GTB x 8 laps at [employer] walk GTB 4 x 26ft GTB Standing hip abd 2x10 30lb TRX squat 2x10 Semi  tandem, SL standing, marching on foam x 30 ea Seated knee ext 3x10 25#  PATIENT EDUCATION:  Education details: eval findings, LEFS, HEP, POC Person educated: Patient Education method: Explanation, Demonstration, and Handouts Education comprehension: verbalized understanding and returned demonstration  HOME EXERCISE PROGRAM: Access Code: [email or web address] URL: [email or web address] Date: 03/04/2024 Prepared by: Alm Kingdom  Exercises - Supine Heel Slide  - 1 x daily - 7 x weekly - 2 sets - 10 reps - 5 sec hold - Supine Quadricep Sets  - 1 x daily - 7 x weekly - 2 sets - 10 reps - 5 sec hold - Active Straight Leg Raise with Quad Set  - 1 x daily - 7 x weekly - 3-4 sets - 5 reps - Sidelying Hip Abduction  - 1 x daily - 7 x weekly - 3-4 sets - 5 reps  ASSESSMENT:  CLINICAL IMPRESSION: Pt was able to complete all prescribed exercises with no adverse effect. Demonstrated continued improvement in knee and hip muscle strength. Has now met LEFS score, showing improved subjective functional ability. Pt is progressing with therapy, will assess and add in new referral for bilateral ankles next session.   EVAL: Patient is a 33 y.o. F who was seen today for physical therapy evaluation and treatment for bilateral knee pain and discomfort. Physical findings are consistent with injury/surgical hx along with bilateral LE weakness particularly in lateral hip and quad. Her LEFS score shows moderate disability in performance of home ADLs and community level activities. Pt would benefit from skilled PT services with specific focus on LE strengthening for improving movement and decreasing knee pain.   OBJECTIVE IMPAIRMENTS: Abnormal gait, decreased activity tolerance, decreased mobility, difficulty walking, decreased ROM, decreased strength, and pain  ACTIVITY LIMITATIONS: carrying, lifting, sitting, standing, squatting, stairs, transfers, and locomotion level  PARTICIPATION LIMITATIONS: meal prep,  cleaning, driving, shopping, community activity, and yard work  PERSONAL FACTORS: Time since onset of injury/illness/exacerbation and 3+ comorbidities: Bipolar disorder, Depression, intellectual disability, bilateral ACL reconstruction  are also affecting patient's functional outcome.   REHAB POTENTIAL: Good  CLINICAL DECISION MAKING: Evolving/moderate complexity  EVALUATION COMPLEXITY: Moderate   GOALS: Goals reviewed with patient? No  SHORT TERM GOALS: Target date: 03/25/2024   Pt will be compliant and knowledgeable with initial HEP for improved comfort and carryover Baseline: initial HEP given  Goal status: MET Pt reports compliance 03/26/24  2.  Pt will self report bilateral knee pain no greater than 7/10 for improved comfort and functional ability Baseline: 10/10 at worst 04/09/2024: 6/10 Goal status: MET  LONG TERM GOALS: Target date: 06/19/2024    Pt will improve LEFS to no less than 50/80 as proxy for functional improvement with home ADLs and higher level community activity Baseline: 34/80 04/09/2024: 36/80 05/28/2024: 54/80 Goal status: MET  2.  Pt will self report bilateral knee pain no greater than 3/10 for improved comfort and functional ability Baseline: 10/10 at worst 04/09/2024: 6/10 04/28/2024: 5/10 Goal status: IN PROGRESS   3.  Pt will increase 30 Second Sit to Stand rep count to no less than 12 reps for improved balance, strength, and functional mobility Baseline: 8 reps 04/09/2024: 12 reps 05/26/2024: 14 reps Goal status: MET   4.  Pt will improve all LE MMT to no less than 4/5 for all tested motions for improved knee stabilization and decreased pain Baseline: see MMT chart Goal status: MET  5.  Pt will be able to squat with hands to floor without increase in bilateral knee pain for improved comfort and functional ability  Baseline: unable 04/28/2024: slight loss of balance, able to pick up  small box Goal status: PARTIALLY MET    6.  Pt will be able to  ambulate 2 miles without increase in knee pain or any instance of knee buckling for improve comfort and function Baseline: 1 miles Goal status: INITIAL   PLAN:  PT FREQUENCY: 2x/week  PT DURATION: 8 weeks  PLANNED INTERVENTIONS: 97164- PT Re-evaluation, 97110-Therapeutic exercises, 97530- Therapeutic activity, [id number]- Neuromuscular re-education, 97535- Self Care, [id number]- Manual therapy, [id number]- Gait training, [id number]- Electrical stimulation (unattended), [id number]- Electrical stimulation (manual), 97016- Vasopneumatic device, Cryotherapy, and Moist heat  PLAN FOR NEXT SESSION: assess HEP response, quad and hip strengthening, progress functional lifting movements    Alm JAYSON Kingdom, PT 05/29/2024, 8:07 AM

## 2024-06-02 ENCOUNTER — Ambulatory Visit

## 2024-06-02 DIAGNOSIS — E1169 Type 2 diabetes mellitus with other specified complication: Secondary | ICD-10-CM | POA: Diagnosis not present

## 2024-06-04 ENCOUNTER — Ambulatory Visit

## 2024-06-04 DIAGNOSIS — M25571 Pain in right ankle and joints of right foot: Secondary | ICD-10-CM

## 2024-06-04 DIAGNOSIS — M25572 Pain in left ankle and joints of left foot: Secondary | ICD-10-CM

## 2024-06-04 DIAGNOSIS — M25562 Pain in left knee: Secondary | ICD-10-CM | POA: Diagnosis not present

## 2024-06-04 DIAGNOSIS — R2689 Other abnormalities of gait and mobility: Secondary | ICD-10-CM

## 2024-06-04 DIAGNOSIS — M6281 Muscle weakness (generalized): Secondary | ICD-10-CM

## 2024-06-04 DIAGNOSIS — G8929 Other chronic pain: Secondary | ICD-10-CM

## 2024-06-04 NOTE — Therapy (Signed)
 OUTPATIENT PHYSICAL THERAPY TREATMENT      Patient Name: Cindy Phillips MRN: [id number] DOB:04-Dec-1990, 33 y.o., female Today's Date: 06/07/2024  END OF SESSION:   PT End of Session - 05/28/24 1353       Visit Number 20     Number of Visits 30     Date for Recertification  07/16/2024    Authorization Type [employer] Dual Complete     PT Start Time 1355    PT Stop Time 1435    PT Time Calculation (min) 40 min     Activity Tolerance Patient tolerated treatment well     Behavior During Therapy WFL for tasks assessed/performed           Past Medical History:  Diagnosis Date   Arthritis    Bipolar 1 disorder (HCC)    Bowel incontinence    Depression    Diabetes mellitus without complication (HCC)    Headache    migraines   Mental disability    Sleep apnea    Past Surgical History:  Procedure Laterality Date   ANTERIOR CRUCIATE LIGAMENT REPAIR Right 05/11/2022   Procedure: RIGHT KNEE ARTHROSCOPY; LATERAL MENISECTOMY; ANTERIOR CRUCIATE LIGAMENT (ACL) REPAIR; CHONDROPLASTY;  Surgeon: Shari Sieving, MD;  Location: [facility];  Service: Orthopedics;  Laterality: Right;   FOOT SURGERY     KNEE ARTHROSCOPY W/ ACL RECONSTRUCTION Left 10/12/2020   TOOTH EXTRACTION     Patient Active Problem List   Diagnosis Date Noted   Pain in left elbow 07/27/2020   Prediabetes 02/03/2019   Obstructive sleep apnea 04/23/2018   Other fatigue 11/26/2017   Shortness of breath on exertion 11/26/2017   Type 2 diabetes mellitus without complication, without long-term current use of insulin  (HCC) 11/26/2017   Other hyperlipidemia 11/26/2017   Vitamin D  deficiency 11/26/2017   Schizoaffective disorder, bipolar type (HCC) 09/14/2014   Intellectual disability 09/10/2014    PCP: Leigh Lung, MD  REFERRING PROVIDER: Shari Sieving, MD  REFERRING DIAG: Hx of ACL Reconstruction   THERAPY DIAG:  Pain in left ankle and joints of left foot  Pain in right ankle and joints of right foot  Chronic  pain of left knee  Chronic pain of right knee  Muscle weakness (generalized)  Other abnormalities of gait and mobility  Rationale for Evaluation and Treatment: Rehabilitation  ONSET DATE: Chronic  SUBJECTIVE:   SUBJECTIVE STATEMENT: ***  EVAL: Pt presents to PT with reports of chronic bilateral knee pain in presence of previous ACL repairs. She is well known to therapist and has been seen for both ACL recoveries in past. She currently has significant pain when standing for long periods of time, or when walking/squatting. Denies referral of symptoms past knee, pain is sharp and along bilateral knee joint lines.   PERTINENT HISTORY: Bipolar disorder, Depression, intellectual disability, bilateral ACL reconstruction   PAIN:  Are you having pain?  Yes: NPRS scale: currently 0/10, not at the moment Worst: 10/10 Pain location: bilateral inferior knee Pain description: sharp, sore Aggravating factors: standing, stairs,  Relieving factors: rest  PRECAUTIONS: None  RED FLAGS: None   WEIGHT BEARING RESTRICTIONS: No  FALLS:  Has patient fallen in last 6 months? -   LIVING ENVIRONMENT: Lives with: lives with their family Lives in: House/apartment Stairs: 4 STE - handrail  Has following equipment at home: None  OCCUPATION: Disability  PLOF: Independent with basic ADLs  PATIENT GOALS: decrease knee pain, improve strength and be able to work out  NEXT MD VISIT:  PRN  OBJECTIVE:  Note: Objective measures were completed at Evaluation unless otherwise noted.  DIAGNOSTIC FINDINGS: N/A  PATIENT SURVEYS:  LEFS: 34/80 05/28/2024: 54/80  COGNITION: Overall cognitive status: Within functional limits for tasks assessed     SENSATION: WFL  POSTURE: rounded shoulders, forward head, increased lumbar lordosis, and large body habitus  PALPATION: TTP to distal quad  LOWER EXTREMITY ROM:  Active ROM Right eval Left eval  Hip flexion    Hip extension    Hip abduction     Hip adduction    Hip internal rotation    Hip external rotation    Knee flexion 114 118  Knee extension 0 5  Ankle dorsiflexion    Ankle plantarflexion    Ankle inversion    Ankle eversion     (Blank rows = not tested)  LOWER EXTREMITY MMT:  MMT Right eval Left eval Right 04/28/24 Left 04/28/24  Hip flexion 3+ 3+ 4 4  Hip extension      Hip abduction 3+ 3+ 4 4  Hip adduction      Hip internal rotation      Hip external rotation      Knee flexion 3+ 3+ 4 4  Knee extension 3+ 3+ 4 4  Ankle dorsiflexion      Ankle plantarflexion      Ankle inversion      Ankle eversion       (Blank rows = not tested)  LOWER EXTREMITY SPECIAL TESTS:  Knee special tests: Lachman Test: negative  FUNCTIONAL TESTS: 30 Second Sit to Stand: 8 reps 04/09/2024: 13 reps  GAIT: Distance walked: 67ft Assistive device utilized: None Level of assistance: Complete Independence Comments: decreased gait speed, antalgia L  TREATMENT: [facility] Adult PT Treatment:                                                DATE: 06/04/24 Bike level 3 x 4 mins for functional activity tolerance Tandem stance x 30 each Heel-toe raise x 20 Standing hip abd 3x10 30# Leg press GTB 3x10 60# TRX squat 3x5 STS 2x10 15# KB Seated knee ext 3x10 30# Assessment of tests/measures  [facility] Adult PT Treatment:                                                DATE: 05/28/24 Bike level 3 x 4 mins for functional activity tolerance Standing hip abd 2x10 37.5# Leg press GTB 3x10 60# TRX squat 3x5 STS 2x10 15# KB Seated knee ext 3x10 30# Assessment of tests/measures  [facility] Adult PT Treatment:                                                DATE: 05/26/24 Bike level 3 x 5 mins for functional activity tolerance Lateral walk BluTB  4 x 66ft Monster walk BluTB 4 x 32ft GTB Semi tandem, SL standing, marching on foam x 30 ea Seated knee ext 3x10 30# Seated knee flexion 3x10 40#  [facility] Adult PT Treatment:  DATE: 05/21/24 Bike level 3 x 4 mins for functional activity tolerance Lateral walk GTB x 8 laps at [employer] walk GTB 4 x 18ft GTB Standing hip abd 2x10 30lb TRX squat 2x10 Semi tandem, SL standing, marching on foam x 30 ea Seated knee ext 3x10 25#  PATIENT EDUCATION:  Education details: eval findings, LEFS, HEP, POC Person educated: Patient Education method: Explanation, Demonstration, and Handouts Education comprehension: verbalized understanding and returned demonstration  HOME EXERCISE PROGRAM: Access Code: [email or web address] URL: [email or web address] Date: 03/04/2024 Prepared by: Alm Kingdom  Exercises - Supine Heel Slide  - 1 x daily - 7 x weekly - 2 sets - 10 reps - 5 sec hold - Supine Quadricep Sets  - 1 x daily - 7 x weekly - 2 sets - 10 reps - 5 sec hold - Active Straight Leg Raise with Quad Set  - 1 x daily - 7 x weekly - 3-4 sets - 5 reps - Sidelying Hip Abduction  - 1 x daily - 7 x weekly - 3-4 sets - 5 reps  ASSESSMENT:  CLINICAL IMPRESSION: ***  EVAL: Patient is a 33 y.o. F who was seen today for physical therapy evaluation and treatment for bilateral knee pain and discomfort. Physical findings are consistent with injury/surgical hx along with bilateral LE weakness particularly in lateral hip and quad. Her LEFS score shows moderate disability in performance of home ADLs and community level activities. Pt would benefit from skilled PT services with specific focus on LE strengthening for improving movement and decreasing knee pain.   OBJECTIVE IMPAIRMENTS: Abnormal gait, decreased activity tolerance, decreased mobility, difficulty walking, decreased ROM, decreased strength, and pain  ACTIVITY LIMITATIONS: carrying, lifting, sitting, standing, squatting, stairs, transfers, and locomotion level  PARTICIPATION LIMITATIONS: meal prep, cleaning, driving, shopping, community activity, and yard work  PERSONAL FACTORS: Time since onset of  injury/illness/exacerbation and 3+ comorbidities: Bipolar disorder, Depression, intellectual disability, bilateral ACL reconstruction  are also affecting patient's functional outcome.   REHAB POTENTIAL: Good  CLINICAL DECISION MAKING: Evolving/moderate complexity  EVALUATION COMPLEXITY: Moderate   GOALS: Goals reviewed with patient? No  SHORT TERM GOALS: Target date: 03/25/2024   Pt will be compliant and knowledgeable with initial HEP for improved comfort and carryover Baseline: initial HEP given  Goal status: MET Pt reports compliance 03/26/24  2.  Pt will self report bilateral knee pain no greater than 7/10 for improved comfort and functional ability Baseline: 10/10 at worst 04/09/2024: 6/10 Goal status: MET  LONG TERM GOALS: Target date: 07/16/2024    Pt will improve LEFS to no less than 50/80 as proxy for functional improvement with home ADLs and higher level community activity Baseline: 34/80 04/09/2024: 36/80 05/28/2024: 54/80 Goal status: MET  2.  Pt will self report bilateral knee pain no greater than 3/10 for improved comfort and functional ability Baseline: 10/10 at worst 04/09/2024: 6/10 04/28/2024: 5/10 Goal status: IN PROGRESS   3.  Pt will increase 30 Second Sit to Stand rep count to no less than 12 reps for improved balance, strength, and functional mobility Baseline: 8 reps 04/09/2024: 12 reps 05/26/2024: 14 reps Goal status: MET   4.  Pt will improve all LE MMT to no less than 4/5 for all tested motions for improved knee stabilization and decreased pain Baseline: see MMT chart Goal status: MET  5.  Pt will be able to squat with hands to floor without increase in bilateral knee pain for improved comfort and functional ability  Baseline: unable 04/28/2024: slight loss  of balance, able to pick up small box Goal status: PARTIALLY MET    6.  Pt will be able to ambulate 2 miles without increase in knee pain or any instance of knee buckling for improve comfort and  function Baseline: 1 miles Goal status: INITIAL   PLAN:  PT FREQUENCY: 2x/week  PT DURATION: 8 weeks  PLANNED INTERVENTIONS: 97164- PT Re-evaluation, 97110-Therapeutic exercises, 97530- Therapeutic activity, [id number]- Neuromuscular re-education, 97535- Self Care, [id number]- Manual therapy, [id number]- Gait training, H9716- Electrical stimulation (unattended), [id number]- Electrical stimulation (manual), 97016- Vasopneumatic device, Cryotherapy, and Moist heat  PLAN FOR NEXT SESSION: assess HEP response, quad and hip strengthening, progress functional lifting movements    Alm JAYSON Kingdom, PT 06/07/2024, 8:40 PM

## 2024-06-12 ENCOUNTER — Encounter (HOSPITAL_COMMUNITY): Payer: Self-pay

## 2024-06-12 ENCOUNTER — Ambulatory Visit (HOSPITAL_COMMUNITY)
Admission: EM | Admit: 2024-06-12 | Discharge: 2024-06-12 | Disposition: A | Discharge status: Home / Self Care | Attending: Family Medicine | Admitting: Family Medicine

## 2024-06-12 DIAGNOSIS — R051 Acute cough: Secondary | ICD-10-CM | POA: Diagnosis not present

## 2024-06-12 DIAGNOSIS — K529 Noninfective gastroenteritis and colitis, unspecified: Secondary | ICD-10-CM | POA: Diagnosis not present

## 2024-06-12 LAB — POC SOFIA SARS ANTIGEN FIA: SARS Coronavirus 2 Ag: NEGATIVE

## 2024-06-12 MED ORDER — ONDANSETRON 4 MG PO TBDP: 4.0000 mg | ORAL_TABLET | Freq: Three times a day (TID) | ORAL | 0 refills | Status: DC | PRN

## 2024-06-12 NOTE — Discharge Instructions (Addendum)
 Your COVID test was negative  Ondansetron  dissolved in the mouth every 8 hours as needed for nausea or vomiting. Clear liquids(water, gatorade/pedialyte, ginger ale/sprite, chicken broth/soup) and bland things(crackers/toast, rice, potato, bananas) to eat. Avoid acidic foods like lemon/lime/orange/tomato, and avoid greasy/spicy foods.  Please follow-up with your primary care about these issues.

## 2024-06-12 NOTE — ED Triage Notes (Signed)
 Nausea for the last 4 days. Also having diarrhea but denies emesis. Denies any changes in medications or diet. No one around with similar symptoms.   Patient tried Pepto pills with no relief.

## 2024-06-12 NOTE — ED Provider Notes (Signed)
 [facility]    CSN: [id number] Arrival date & time: 06/12/24  1500      History   Chief Complaint Chief Complaint  Patient presents with   Nausea    HPI Cindy Phillips is a 33 y.o. female.   HPI Here for nausea and diarrhea.  Symptoms began on October 20.  She has thrown up once last evening when she tried to eat a meal.  The diarrhea has been about 3 times a day and is loose.  No blood in the stool.  No blood in the emesis.  She has felt hot but has not measured any fever.  She had a little cough starting yesterday.  No nasal congestion to mention.  NKDA  Uncertain when last period was She denies the possibility of pregnancy Past Medical History:  Diagnosis Date   Arthritis    Bipolar 1 disorder (HCC)    Bowel incontinence    Depression    Diabetes mellitus without complication (HCC)    Headache    migraines   Mental disability    Sleep apnea     Patient Active Problem List   Diagnosis Date Noted   Pain in left elbow 07/27/2020   Prediabetes 02/03/2019   Obstructive sleep apnea 04/23/2018   Other fatigue 11/26/2017   Shortness of breath on exertion 11/26/2017   Type 2 diabetes mellitus without complication, without long-term current use of insulin  (HCC) 11/26/2017   Other hyperlipidemia 11/26/2017   Vitamin D  deficiency 11/26/2017   Schizoaffective disorder, bipolar type (HCC) 09/14/2014   Intellectual disability 09/10/2014    Past Surgical History:  Procedure Laterality Date   ANTERIOR CRUCIATE LIGAMENT REPAIR Right 05/11/2022   Procedure: RIGHT KNEE ARTHROSCOPY; LATERAL MENISECTOMY; ANTERIOR CRUCIATE LIGAMENT (ACL) REPAIR; CHONDROPLASTY;  Surgeon: Shari Sieving, MD;  Location: [facility];  Service: Orthopedics;  Laterality: Right;   FOOT SURGERY     KNEE ARTHROSCOPY W/ ACL RECONSTRUCTION Left 10/12/2020   TOOTH EXTRACTION      OB History     Gravida  0   Para  0   Term  0   Preterm  0   AB  0   Living  0      SAB  0    IAB  0   Ectopic  0   Multiple  0   Live Births  0            Home Medications    Prior to Admission medications   Medication Sig Start Date End Date Taking? Authorizing Provider  atorvastatin  (LIPITOR) 40 MG tablet Take 40 mg by mouth every evening.   Yes [provider]  baclofen  (LIORESAL ) 10 MG tablet Take 10 mg by mouth. 05/06/23  Yes [provider]  gabapentin  (NEURONTIN ) 300 MG capsule Take 300 mg by mouth every evening. 04/17/22  Yes [provider]  hydrOXYzine  (ATARAX ) 50 MG tablet Take 1 tablet (50 mg total) by mouth 2 (two) times daily as needed for anxiety. 04/09/23  Yes Pashayan, Marsa RAMAN, DO  lamoTRIgine  (LAMICTAL ) 150 MG tablet Take 150 mg by mouth daily. 06/11/23  Yes [provider]  LINZESS  72 MCG capsule Take 72 mcg by mouth every morning. 04/02/23  Yes [provider]  meloxicam (MOBIC) 15 MG tablet Take 1 tablet (15 mg total) by mouth daily. 05/20/24  Yes Joya Stabs, DPM  metFORMIN  (GLUCOPHAGE ) 500 MG tablet Take one tablet by mouth in the morning and one tablet by mouth in the  evening Patient taking differently: Take 500 mg by mouth daily with breakfast. 03/21/20  Yes Beasley, Caren D, MD  OLANZapine  (ZYPREXA ) 20 MG tablet Take 20 mg by mouth at bedtime. 06/04/23  Yes [provider]  OLANZapine  zydis (ZYPREXA ) 10 MG disintegrating tablet Take 1 tablet (10 mg total) by mouth at bedtime. Patient taking differently: Take 15 mg by mouth at bedtime. 04/09/23  Yes Pashayan, Alexander S, DO  ondansetron  (ZOFRAN -ODT) 4 MG disintegrating tablet Take 1 tablet (4 mg total) by mouth every 8 (eight) hours as needed for nausea or vomiting. 06/12/24  Yes Vonna Sharlet POUR, MD    Family History Family History  Problem Relation Age of Onset   High blood pressure Mother    Anxiety disorder Mother    Diabetes Maternal Grandmother     Social History Social History   Tobacco Use   Smoking status: Never    Smokeless tobacco: Never  Vaping Use   Vaping status: Never Used  Substance Use Topics   Alcohol use: Not Currently   Drug use: No     Allergies   Patient has no known allergies.   Review of Systems Review of Systems   Physical Exam Triage Vital Signs ED Triage Vitals  Encounter Vitals Group     BP 06/12/24 1613 (!) 146/92     Girls Systolic BP Percentile --      Girls Diastolic BP Percentile --      Boys Systolic BP Percentile --      Boys Diastolic BP Percentile --      Pulse Rate 06/12/24 1613 88     Resp 06/12/24 1613 18     Temp 06/12/24 1613 98.1 F (36.7 C)     Temp Source 06/12/24 1613 Oral     SpO2 06/12/24 1613 97 %     Weight 06/12/24 1613 (!) 332 lb (150.6 kg)     Height 06/12/24 1613 6' (1.829 m)     Head Circumference --      Peak Flow --      Pain Score 06/12/24 1610 10     Pain Loc --      Pain Education --      Exclude from Growth Chart --    No data found.  Updated Vital Signs BP (!) 146/92 (BP Location: Left Arm)   Pulse 88   Temp 98.1 F (36.7 C) (Oral)   Resp 18   Ht 6' (1.829 m)   Wt (!) 150.6 kg   LMP  (LMP Unknown) Comment: recently stopped taking depo injections.  SpO2 97%   BMI 45.03 kg/m   Visual Acuity Right Eye Distance:   Left Eye Distance:   Bilateral Distance:    Right Eye Near:   Left Eye Near:    Bilateral Near:     Physical Exam Vitals reviewed.  Constitutional:      General: She is not in acute distress.    Appearance: She is not toxic-appearing.  HENT:     Right Ear: Tympanic membrane and ear canal normal.     Left Ear: Tympanic membrane and ear canal normal.     Nose: Nose normal.     Mouth/Throat:     Mouth: Mucous membranes are moist.     Pharynx: No oropharyngeal exudate or posterior oropharyngeal erythema.  Eyes:     Extraocular Movements: Extraocular movements intact.     Conjunctiva/sclera: Conjunctivae normal.     Pupils: Pupils are equal, round, and reactive to  light.  Cardiovascular:      Rate and Rhythm: Normal rate and regular rhythm.     Heart sounds: No murmur heard. Pulmonary:     Effort: Pulmonary effort is normal. No respiratory distress.     Breath sounds: No stridor. No wheezing, rhonchi or rales.  Musculoskeletal:     Cervical back: Neck supple.  Lymphadenopathy:     Cervical: No cervical adenopathy.  Skin:    Capillary Refill: Capillary refill takes less than 2 seconds.     Coloration: Skin is not jaundiced or pale.  Neurological:     General: No focal deficit present.     Mental Status: She is alert and oriented to person, place, and time.  Psychiatric:        Behavior: Behavior normal.      UC Treatments / Results  Labs (all labs ordered are listed, but only abnormal results are displayed) Labs Reviewed  POC SOFIA SARS ANTIGEN FIA    EKG   Radiology No results found.  Procedures Procedures (including critical care time)  Medications Ordered in UC Medications - No data to display  Initial Impression / Assessment and Plan / UC Course  I have reviewed the triage vital signs and the nursing notes.  Pertinent labs & imaging results that were available during my care of the patient were reviewed by me and considered in my medical decision making (see chart for details).     COVID test is negative.  Zofran  is sent in for the nausea and Tessalon Perles were sent in for the cough.  I have discussed with her that this most likely is a viral illness.  I have asked her to follow-up with her PCP.  We discussed clear liquids and bland diet. Final Clinical Impressions(s) / UC Diagnoses   Final diagnoses:  Gastroenteritis  Acute cough     Discharge Instructions      Your COVID test was negative  Ondansetron  dissolved in the mouth every 8 hours as needed for nausea or vomiting. Clear liquids(water, gatorade/pedialyte, ginger ale/sprite, chicken broth/soup) and bland things(crackers/toast, rice, potato, bananas) to eat. Avoid acidic foods  like lemon/lime/orange/tomato, and avoid greasy/spicy foods.  Please follow-up with your primary care about these issues.       ED Prescriptions     Medication Sig Dispense Auth. Provider   ondansetron  (ZOFRAN -ODT) 4 MG disintegrating tablet Take 1 tablet (4 mg total) by mouth every 8 (eight) hours as needed for nausea or vomiting. 10 tablet Vonna Irais Mottram K, MD      PDMP not reviewed this encounter.   Vonna Sharlet POUR, MD 06/12/24 [id number]

## 2024-06-16 ENCOUNTER — Ambulatory Visit

## 2024-06-16 ENCOUNTER — Encounter: Payer: Self-pay | Admitting: *Deleted

## 2024-06-16 DIAGNOSIS — G8929 Other chronic pain: Secondary | ICD-10-CM

## 2024-06-16 DIAGNOSIS — M25572 Pain in left ankle and joints of left foot: Secondary | ICD-10-CM

## 2024-06-16 DIAGNOSIS — R2689 Other abnormalities of gait and mobility: Secondary | ICD-10-CM

## 2024-06-16 DIAGNOSIS — M25562 Pain in left knee: Secondary | ICD-10-CM | POA: Diagnosis not present

## 2024-06-16 DIAGNOSIS — M6281 Muscle weakness (generalized): Secondary | ICD-10-CM

## 2024-06-16 DIAGNOSIS — M25571 Pain in right ankle and joints of right foot: Secondary | ICD-10-CM

## 2024-06-16 NOTE — Therapy (Signed)
 OUTPATIENT PHYSICAL THERAPY TREATMENT      Patient Name: Cindy Phillips MRN: [id number] DOB:1990-12-26, 33 y.o., female Today's Date: 06/16/2024  END OF SESSION:  PT End of Session - 06/16/24 1355     Visit Number 22    Number of Visits 35    Date for Recertification  07/16/24    Authorization Type [employer] Dual Complete    PT Start Time 1400    PT Stop Time 1444    PT Time Calculation (min) 44 min    Activity Tolerance Patient tolerated treatment well    Behavior During Therapy WFL for tasks assessed/performed         Past Medical History:  Diagnosis Date   Arthritis    Bipolar 1 disorder (HCC)    Bowel incontinence    Depression    Diabetes mellitus without complication (HCC)    Headache    migraines   Mental disability    Sleep apnea    Past Surgical History:  Procedure Laterality Date   ANTERIOR CRUCIATE LIGAMENT REPAIR Right 05/11/2022   Procedure: RIGHT KNEE ARTHROSCOPY; LATERAL MENISECTOMY; ANTERIOR CRUCIATE LIGAMENT (ACL) REPAIR; CHONDROPLASTY;  Surgeon: Shari Sieving, MD;  Location: [facility];  Service: Orthopedics;  Laterality: Right;   FOOT SURGERY     KNEE ARTHROSCOPY W/ ACL RECONSTRUCTION Left 10/12/2020   TOOTH EXTRACTION     Patient Active Problem List   Diagnosis Date Noted   Pain in left elbow 07/27/2020   Prediabetes 02/03/2019   Obstructive sleep apnea 04/23/2018   Other fatigue 11/26/2017   Shortness of breath on exertion 11/26/2017   Type 2 diabetes mellitus without complication, without long-term current use of insulin  (HCC) 11/26/2017   Other hyperlipidemia 11/26/2017   Vitamin D  deficiency 11/26/2017   Schizoaffective disorder, bipolar type (HCC) 09/14/2014   Intellectual disability 09/10/2014    PCP: Leigh Lung, MD  REFERRING PROVIDER:  02/19/2024 - Knees - Shari Sieving, MD 05/22/2024 - Verlena GLENWOOD Joya Asberry, DPM   REFERRING DIAG:  02/19/2024 - Hx of ACL Reconstruction  05/22/2024 -  M76.72 (ICD-10-CM) - Peroneal tendonitis,  left M76.71 (ICD-10-CM) - Peroneal tendonitis, right    THERAPY DIAG:  Pain in left ankle and joints of left foot  Pain in right ankle and joints of right foot  Chronic pain of left knee  Chronic pain of right knee  Muscle weakness (generalized)  Other abnormalities of gait and mobility  Rationale for Evaluation and Treatment: Rehabilitation  ONSET DATE: Chronic  SUBJECTIVE:   SUBJECTIVE STATEMENT: Patient reports that her ankles feel ok today, is still having trouble with her balance.   EVAL: Pt presents to PT with reports of chronic bilateral knee pain in presence of previous ACL repairs. She is well known to therapist and has been seen for both ACL recoveries in past. She currently has significant pain when standing for long periods of time, or when walking/squatting. Denies referral of symptoms past knee, pain is sharp and along bilateral knee joint lines.   PERTINENT HISTORY: Bipolar disorder, Depression, intellectual disability, bilateral ACL reconstruction   PAIN:  Are you having pain?  Yes: NPRS scale: currently 0/10, not at the moment Worst: 10/10 Pain location: bilateral inferior knee Pain description: sharp, sore Aggravating factors: standing, stairs,  Relieving factors: rest  PRECAUTIONS: None  RED FLAGS: None   WEIGHT BEARING RESTRICTIONS: No  FALLS:  Has patient fallen in last 6 months? -   LIVING ENVIRONMENT: Lives with: lives with their family Lives in: House/apartment Stairs:  4 STE - handrail  Has following equipment at home: None  OCCUPATION: Disability  PLOF: Independent with basic ADLs  PATIENT GOALS: decrease knee pain, improve strength and be able to work out  NEXT MD VISIT: PRN  OBJECTIVE:  Note: Objective measures were completed at Evaluation unless otherwise noted.  DIAGNOSTIC FINDINGS: N/A  PATIENT SURVEYS:  LEFS: 34/80 05/28/2024: 54/80  COGNITION: Overall cognitive status: Within functional limits for tasks  assessed     SENSATION: WFL  POSTURE: rounded shoulders, forward head, increased lumbar lordosis, and large body habitus  PALPATION: TTP to distal quad  LOWER EXTREMITY ROM:  Active ROM Right eval Left eval  Hip flexion    Hip extension    Hip abduction    Hip adduction    Hip internal rotation    Hip external rotation    Knee flexion 114 118  Knee extension 0 5  Ankle dorsiflexion    Ankle plantarflexion    Ankle inversion    Ankle eversion     (Blank rows = not tested)  LOWER EXTREMITY MMT:  MMT Right eval Left eval Right 04/28/24 Left 04/28/24  Hip flexion 3+ 3+ 4 4  Hip extension      Hip abduction 3+ 3+ 4 4  Hip adduction      Hip internal rotation      Hip external rotation      Knee flexion 3+ 3+ 4 4  Knee extension 3+ 3+ 4 4  Ankle dorsiflexion      Ankle plantarflexion      Ankle inversion      Ankle eversion       (Blank rows = not tested)  LOWER EXTREMITY SPECIAL TESTS:  Knee special tests: Lachman Test: negative  FUNCTIONAL TESTS: 30 Second Sit to Stand: 8 reps 04/09/2024: 13 reps  06/04/24:  SLS: 3 sec each  GAIT: Distance walked: 47ft Assistive device utilized: None Level of assistance: Complete Independence Comments: decreased gait speed, antalgia L  TREATMENT: [facility] Adult PT Treatment:                                                DATE: 06/16/24 Therapeutic Exercise: Bike level 3 x 5 mins for functional activity tolerance Heel-toe raise x 20 Standing hip abd 3x10 30# Leg press GTB 3x10 60# TRX squat 3x5 Seated knee ext 2x10 30# Neuromuscular re-ed: Tandem stance x30 each   [facility] Adult PT Treatment:                                                DATE: 06/04/24 Bike level 3 x 4 mins for functional activity tolerance Tandem stance x 30 each Heel-toe raise x 20 Standing hip abd 3x10 30# Leg press GTB 3x10 60# TRX squat 3x5 STS 2x10 15# KB Seated knee ext 3x10 30# Assessment of tests/measures  [facility] Adult PT Treatment:                                                 DATE: 05/28/24 Bike level 3 x 4 mins for functional activity tolerance Standing  hip abd 2x10 37.5# Leg press GTB 3x10 60# TRX squat 3x5 STS 2x10 15# KB Seated knee ext 3x10 30# Assessment of tests/measures  PATIENT EDUCATION:  Education details: eval findings, LEFS, HEP, POC Person educated: Patient Education method: Explanation, Demonstration, and Handouts Education comprehension: verbalized understanding and returned demonstration  HOME EXERCISE PROGRAM: Access Code: [email or web address] URL: [email or web address] Date: 03/04/2024 Prepared by: Alm Kingdom  Exercises - Supine Heel Slide  - 1 x daily - 7 x weekly - 2 sets - 10 reps - 5 sec hold - Supine Quadricep Sets  - 1 x daily - 7 x weekly - 2 sets - 10 reps - 5 sec hold - Active Straight Leg Raise with Quad Set  - 1 x daily - 7 x weekly - 3-4 sets - 5 reps - Sidelying Hip Abduction  - 1 x daily - 7 x weekly - 3-4 sets - 5 reps  ASSESSMENT:  CLINICAL IMPRESSION: Patient presents to PT reporting that her knees and ankles feel ok today, but notices her balance is still off. She has increased fatigue today from UC on Friday. Session today continued to focus on quadriceps and proximal hip strengthening as well as incorporation of balance tasks to strengthen BIL ankles. Patient was able to tolerate all prescribed exercises with no adverse effects. Patient continues to benefit from skilled PT services and should be progressed as able to improve functional independence.   EVAL: Patient is a 33 y.o. F who was seen today for physical therapy evaluation and treatment for bilateral knee pain and discomfort. Physical findings are consistent with injury/surgical hx along with bilateral LE weakness particularly in lateral hip and quad. Her LEFS score shows moderate disability in performance of home ADLs and community level activities. Pt would benefit from skilled PT services with specific focus on  LE strengthening for improving movement and decreasing knee pain.   OBJECTIVE IMPAIRMENTS: Abnormal gait, decreased activity tolerance, decreased mobility, difficulty walking, decreased ROM, decreased strength, and pain  ACTIVITY LIMITATIONS: carrying, lifting, sitting, standing, squatting, stairs, transfers, and locomotion level  PARTICIPATION LIMITATIONS: meal prep, cleaning, driving, shopping, community activity, and yard work  PERSONAL FACTORS: Time since onset of injury/illness/exacerbation and 3+ comorbidities: Bipolar disorder, Depression, intellectual disability, bilateral ACL reconstruction  are also affecting patient's functional outcome.   REHAB POTENTIAL: Good  CLINICAL DECISION MAKING: Evolving/moderate complexity  EVALUATION COMPLEXITY: Moderate   GOALS: Goals reviewed with patient? No  SHORT TERM GOALS: Target date: 03/25/2024   Pt will be compliant and knowledgeable with initial HEP for improved comfort and carryover Baseline: initial HEP given  Goal status: MET Pt reports compliance 03/26/24  2.  Pt will self report bilateral knee pain no greater than 7/10 for improved comfort and functional ability Baseline: 10/10 at worst 04/09/2024: 6/10 Goal status: MET  LONG TERM GOALS: Target date: 07/16/2024    Pt will improve LEFS to no less than 50/80 as proxy for functional improvement with home ADLs and higher level community activity Baseline: 34/80 04/09/2024: 36/80 05/28/2024: 54/80 Goal status: MET  2.  Pt will self report bilateral knee pain no greater than 3/10 for improved comfort and functional ability Baseline: 10/10 at worst 04/09/2024: 6/10 04/28/2024: 5/10 Goal status: IN PROGRESS   3.  Pt will increase 30 Second Sit to Stand rep count to no less than 12 reps for improved balance, strength, and functional mobility Baseline: 8 reps 04/09/2024: 12 reps 05/26/2024: 14 reps Goal status: MET   4.  Pt will improve all LE  MMT to no less than 4/5 for all tested  motions for improved knee stabilization and decreased pain Baseline: see MMT chart Goal status: MET  5.  Pt will be able to squat with hands to floor without increase in bilateral knee pain for improved comfort and functional ability  Baseline: unable 04/28/2024: slight loss of balance, able to pick up small box Goal status: PARTIALLY MET    6.  Pt will be able to ambulate 2 miles without increase in knee pain or any instance of knee buckling for improve comfort and function Baseline: 1 miles Goal status: INITIAL  7.  Pt will improve bilateral SLS time to at least 20 seconds each for improved balance and ankle stability Baseline: 3 seconds each Goal status: INITIAL   PLAN:  PT FREQUENCY: 1-2x/week  PT DURATION: 6 weeks  PLANNED INTERVENTIONS: 97164- PT Re-evaluation, 97110-Therapeutic exercises, 97530- Therapeutic activity, [id number]- Neuromuscular re-education, 97535- Self Care, [id number]- Manual therapy, [id number]- Gait training, H9716- Electrical stimulation (unattended), [id number]- Electrical stimulation (manual), 97016- Vasopneumatic device, Cryotherapy, and Moist heat  PLAN FOR NEXT SESSION: assess HEP response, quad and hip strengthening, progress functional lifting movements    Corean Pouch, PTA 06/16/2024, 2:45 PM

## 2024-06-16 NOTE — Progress Notes (Signed)
 Cindy Phillips                                          MRN: [id number]   06/16/2024   The [facility] Quality Team Specialist reviewed this patient medical record for the purposes of chart review for care gap closure. The following were reviewed: chart review for care gap closure-glycemic status assessment and kidney health evaluation for diabetes:eGFR  and uACR.    [facility] Quality Team

## 2024-06-18 ENCOUNTER — Ambulatory Visit

## 2024-06-18 DIAGNOSIS — M25562 Pain in left knee: Secondary | ICD-10-CM | POA: Diagnosis not present

## 2024-06-18 DIAGNOSIS — R2689 Other abnormalities of gait and mobility: Secondary | ICD-10-CM

## 2024-06-18 DIAGNOSIS — G8929 Other chronic pain: Secondary | ICD-10-CM

## 2024-06-18 DIAGNOSIS — M6281 Muscle weakness (generalized): Secondary | ICD-10-CM

## 2024-06-18 DIAGNOSIS — M25571 Pain in right ankle and joints of right foot: Secondary | ICD-10-CM

## 2024-06-18 DIAGNOSIS — M25572 Pain in left ankle and joints of left foot: Secondary | ICD-10-CM

## 2024-06-18 NOTE — Therapy (Addendum)
 OUTPATIENT PHYSICAL THERAPY TREATMENT      Patient Name: Cindy Phillips MRN: [id number] DOB:1990-09-11, 33 y.o., female Today's Date: 06/18/2024  END OF SESSION:  PT End of Session - 06/18/24 1343     Visit Number 23    Number of Visits 35    Date for Recertification  07/16/24    PT Start Time 1400    PT Stop Time 1443    PT Time Calculation (min) 43 min    Activity Tolerance Patient tolerated treatment well    Behavior During Therapy WFL for tasks assessed/performed          Past Medical History:  Diagnosis Date   Arthritis    Bipolar 1 disorder (HCC)    Bowel incontinence    Depression    Diabetes mellitus without complication (HCC)    Headache    migraines   Mental disability    Sleep apnea    Past Surgical History:  Procedure Laterality Date   ANTERIOR CRUCIATE LIGAMENT REPAIR Right 05/11/2022   Procedure: RIGHT KNEE ARTHROSCOPY; LATERAL MENISECTOMY; ANTERIOR CRUCIATE LIGAMENT (ACL) REPAIR; CHONDROPLASTY;  Surgeon: Shari Sieving, MD;  Location: [facility];  Service: Orthopedics;  Laterality: Right;   FOOT SURGERY     KNEE ARTHROSCOPY W/ ACL RECONSTRUCTION Left 10/12/2020   TOOTH EXTRACTION     Patient Active Problem List   Diagnosis Date Noted   Pain in left elbow 07/27/2020   Prediabetes 02/03/2019   Obstructive sleep apnea 04/23/2018   Other fatigue 11/26/2017   Shortness of breath on exertion 11/26/2017   Type 2 diabetes mellitus without complication, without long-term current use of insulin  (HCC) 11/26/2017   Other hyperlipidemia 11/26/2017   Vitamin D  deficiency 11/26/2017   Schizoaffective disorder, bipolar type (HCC) 09/14/2014   Intellectual disability 09/10/2014    PCP: Leigh Lung, MD  REFERRING PROVIDER:  02/19/2024 - Knees - Shari Sieving, MD 05/22/2024 - Verlena GLENWOOD Joya Asberry, DPM   REFERRING DIAG:  02/19/2024 - Hx of ACL Reconstruction  05/22/2024 -  M76.72 (ICD-10-CM) - Peroneal tendonitis, left M76.71 (ICD-10-CM) - Peroneal  tendonitis, right    THERAPY DIAG:  Pain in left ankle and joints of left foot  Pain in right ankle and joints of right foot  Chronic pain of left knee  Chronic pain of right knee  Muscle weakness (generalized)  Other abnormalities of gait and mobility  Rationale for Evaluation and Treatment: Rehabilitation  ONSET DATE: Chronic  SUBJECTIVE:   SUBJECTIVE STATEMENT: Patient reports that her ankles are okay today, and she had some soreness after last session.  EVAL: Pt presents to PT with reports of chronic bilateral knee pain in presence of previous ACL repairs. She is well known to therapist and has been seen for both ACL recoveries in past. She currently has significant pain when standing for long periods of time, or when walking/squatting. Denies referral of symptoms past knee, pain is sharp and along bilateral knee joint lines.   PERTINENT HISTORY: Bipolar disorder, Depression, intellectual disability, bilateral ACL reconstruction   PAIN:  Are you having pain?  Yes: NPRS scale: currently 0/10, not at the moment Worst: 10/10 Pain location: bilateral inferior knee Pain description: sharp, sore Aggravating factors: standing, stairs,  Relieving factors: rest  PRECAUTIONS: None  RED FLAGS: None   WEIGHT BEARING RESTRICTIONS: No  FALLS:  Has patient fallen in last 6 months? -   LIVING ENVIRONMENT: Lives with: lives with their family Lives in: House/apartment Stairs: 4 STE - handrail  Has following  equipment at home: None  OCCUPATION: Disability  PLOF: Independent with basic ADLs  PATIENT GOALS: decrease knee pain, improve strength and be able to work out  NEXT MD VISIT: PRN  OBJECTIVE:  Note: Objective measures were completed at Evaluation unless otherwise noted.  DIAGNOSTIC FINDINGS: N/A  PATIENT SURVEYS:  LEFS: 34/80 05/28/2024: 54/80  COGNITION: Overall cognitive status: Within functional limits for tasks  assessed     SENSATION: WFL  POSTURE: rounded shoulders, forward head, increased lumbar lordosis, and large body habitus  PALPATION: TTP to distal quad  LOWER EXTREMITY ROM:  Active ROM Right eval Left eval  Hip flexion    Hip extension    Hip abduction    Hip adduction    Hip internal rotation    Hip external rotation    Knee flexion 114 118  Knee extension 0 5  Ankle dorsiflexion    Ankle plantarflexion    Ankle inversion    Ankle eversion     (Blank rows = not tested)  LOWER EXTREMITY MMT:  MMT Right eval Left eval Right 04/28/24 Left 04/28/24  Hip flexion 3+ 3+ 4 4  Hip extension      Hip abduction 3+ 3+ 4 4  Hip adduction      Hip internal rotation      Hip external rotation      Knee flexion 3+ 3+ 4 4  Knee extension 3+ 3+ 4 4  Ankle dorsiflexion      Ankle plantarflexion      Ankle inversion      Ankle eversion       (Blank rows = not tested)  LOWER EXTREMITY SPECIAL TESTS:  Knee special tests: Lachman Test: negative  FUNCTIONAL TESTS: 30 Second Sit to Stand: 8 reps 04/09/2024: 13 reps  06/04/24:  SLS: 3 sec each  GAIT: Distance walked: 97ft Assistive device utilized: None Level of assistance: Complete Independence Comments: decreased gait speed, antalgia L  TREATMENT: [facility] Adult PT Treatment:                                                DATE: 06/18/24 Therapeutic Exercise: Bike level 3 x 5 mins for functional activity tolerance Heel raises on cybex hip machine x 20 Standing hip abd 2x10 30# Leg press GTB 3x10 80# TRX squat 3x5 Seated knee ext 2x10 30# Neuromuscular re-ed: Semi-Tandem stance 2x30 each FT stance on Airex 2x30 Marching on Airex 3x30  [facility] Adult PT Treatment:                                                DATE: 06/16/24 Therapeutic Exercise: Bike level 3 x 5 mins for functional activity tolerance Heel-toe raise x 20 Standing hip abd 3x10 30# Leg press GTB 3x10 60# TRX squat 3x5 Seated knee ext 2x10  30# Neuromuscular re-ed: Tandem stance x30 each   [facility] Adult PT Treatment:                                                DATE: 06/04/24 Bike level 3 x 4 mins for functional activity  tolerance Tandem stance x 30 each Heel-toe raise x 20 Standing hip abd 3x10 30# Leg press GTB 3x10 60# TRX squat 3x5 STS 2x10 15# KB Seated knee ext 3x10 30# Assessment of tests/measures   PATIENT EDUCATION:  Education details: eval findings, LEFS, HEP, POC Person educated: Patient Education method: Explanation, Demonstration, and Handouts Education comprehension: verbalized understanding and returned demonstration  HOME EXERCISE PROGRAM: Access Code: [email or web address] URL: [email or web address] Date: 03/04/2024 Prepared by: Alm Kingdom  Exercises - Supine Heel Slide  - 1 x daily - 7 x weekly - 2 sets - 10 reps - 5 sec hold - Supine Quadricep Sets  - 1 x daily - 7 x weekly - 2 sets - 10 reps - 5 sec hold - Active Straight Leg Raise with Quad Set  - 1 x daily - 7 x weekly - 3-4 sets - 5 reps - Sidelying Hip Abduction  - 1 x daily - 7 x weekly - 3-4 sets - 5 reps  ASSESSMENT:  CLINICAL IMPRESSION: Patient was able to tolerate all exercises well. Patient reported with no pain in either knees or ankles. Today's session focused on hip and quadriceps strengthening as well as balance exercises to increase overall ankle stability. Patient would benefit from continued skilled PT services as tolerated to increase functional independence.  EVAL: Patient is a 33 y.o. F who was seen today for physical therapy evaluation and treatment for bilateral knee pain and discomfort. Physical findings are consistent with injury/surgical hx along with bilateral LE weakness particularly in lateral hip and quad. Her LEFS score shows moderate disability in performance of home ADLs and community level activities. Pt would benefit from skilled PT services with specific focus on LE strengthening for improving movement  and decreasing knee pain.   OBJECTIVE IMPAIRMENTS: Abnormal gait, decreased activity tolerance, decreased mobility, difficulty walking, decreased ROM, decreased strength, and pain  ACTIVITY LIMITATIONS: carrying, lifting, sitting, standing, squatting, stairs, transfers, and locomotion level  PARTICIPATION LIMITATIONS: meal prep, cleaning, driving, shopping, community activity, and yard work  PERSONAL FACTORS: Time since onset of injury/illness/exacerbation and 3+ comorbidities: Bipolar disorder, Depression, intellectual disability, bilateral ACL reconstruction  are also affecting patient's functional outcome.   REHAB POTENTIAL: Good  CLINICAL DECISION MAKING: Evolving/moderate complexity  EVALUATION COMPLEXITY: Moderate   GOALS: Goals reviewed with patient? No  SHORT TERM GOALS: Target date: 03/25/2024   Pt will be compliant and knowledgeable with initial HEP for improved comfort and carryover Baseline: initial HEP given  Goal status: MET Pt reports compliance 03/26/24  2.  Pt will self report bilateral knee pain no greater than 7/10 for improved comfort and functional ability Baseline: 10/10 at worst 04/09/2024: 6/10 Goal status: MET  LONG TERM GOALS: Target date: 07/16/2024    Pt will improve LEFS to no less than 50/80 as proxy for functional improvement with home ADLs and higher level community activity Baseline: 34/80 04/09/2024: 36/80 05/28/2024: 54/80 Goal status: MET  2.  Pt will self report bilateral knee pain no greater than 3/10 for improved comfort and functional ability Baseline: 10/10 at worst 04/09/2024: 6/10 04/28/2024: 5/10 Goal status: IN PROGRESS   3.  Pt will increase 30 Second Sit to Stand rep count to no less than 12 reps for improved balance, strength, and functional mobility Baseline: 8 reps 04/09/2024: 12 reps 05/26/2024: 14 reps Goal status: MET   4.  Pt will improve all LE MMT to no less than 4/5 for all tested motions for improved knee stabilization  and decreased pain Baseline: see  MMT chart Goal status: MET  5.  Pt will be able to squat with hands to floor without increase in bilateral knee pain for improved comfort and functional ability  Baseline: unable 04/28/2024: slight loss of balance, able to pick up small box Goal status: PARTIALLY MET    6.  Pt will be able to ambulate 2 miles without increase in knee pain or any instance of knee buckling for improve comfort and function Baseline: 1 miles Goal status: INITIAL  7.  Pt will improve bilateral SLS time to at least 20 seconds each for improved balance and ankle stability Baseline: 3 seconds each Goal status: INITIAL   PLAN:  PT FREQUENCY: 1-2x/week  PT DURATION: 6 weeks  PLANNED INTERVENTIONS: 97164- PT Re-evaluation, 97110-Therapeutic exercises, 97530- Therapeutic activity, [id number]- Neuromuscular re-education, 97535- Self Care, 02859- Manual therapy, [id number]- Gait training, H9716- Electrical stimulation (unattended), [id number]- Electrical stimulation (manual), 97016- Vasopneumatic device, Cryotherapy, and Moist heat  PLAN FOR NEXT SESSION: assess HEP response, quad and hip strengthening, progress functional lifting movements    Shanda Code 06/18/2024, 3:06 PM

## 2024-06-23 ENCOUNTER — Ambulatory Visit: Attending: Podiatry

## 2024-06-23 DIAGNOSIS — M25562 Pain in left knee: Secondary | ICD-10-CM | POA: Insufficient documentation

## 2024-06-23 DIAGNOSIS — M6281 Muscle weakness (generalized): Secondary | ICD-10-CM | POA: Insufficient documentation

## 2024-06-23 DIAGNOSIS — G8929 Other chronic pain: Secondary | ICD-10-CM | POA: Diagnosis present

## 2024-06-23 DIAGNOSIS — R2689 Other abnormalities of gait and mobility: Secondary | ICD-10-CM | POA: Diagnosis present

## 2024-06-23 DIAGNOSIS — M25561 Pain in right knee: Secondary | ICD-10-CM | POA: Diagnosis present

## 2024-06-23 DIAGNOSIS — M25572 Pain in left ankle and joints of left foot: Secondary | ICD-10-CM | POA: Diagnosis present

## 2024-06-23 DIAGNOSIS — M25571 Pain in right ankle and joints of right foot: Secondary | ICD-10-CM | POA: Insufficient documentation

## 2024-06-23 NOTE — Therapy (Signed)
 OUTPATIENT PHYSICAL THERAPY TREATMENT      Patient Name: Cindy Phillips MRN: [id number] DOB:15-Jun-1991, 33 y.o., female Today's Date: 06/24/2024  END OF SESSION:  PT End of Session - 06/23/24 1324     Visit Number 24    Number of Visits 35    Date for Recertification  07/16/24    PT Start Time 1400    PT Stop Time 1440    PT Time Calculation (min) 40 min    Activity Tolerance Patient tolerated treatment well    Behavior During Therapy WFL for tasks assessed/performed           Past Medical History:  Diagnosis Date   Arthritis    Bipolar 1 disorder (HCC)    Bowel incontinence    Depression    Diabetes mellitus without complication (HCC)    Headache    migraines   Mental disability    Sleep apnea    Past Surgical History:  Procedure Laterality Date   ANTERIOR CRUCIATE LIGAMENT REPAIR Right 05/11/2022   Procedure: RIGHT KNEE ARTHROSCOPY; LATERAL MENISECTOMY; ANTERIOR CRUCIATE LIGAMENT (ACL) REPAIR; CHONDROPLASTY;  Surgeon: Shari Sieving, MD;  Location: [facility];  Service: Orthopedics;  Laterality: Right;   FOOT SURGERY     KNEE ARTHROSCOPY W/ ACL RECONSTRUCTION Left 10/12/2020   TOOTH EXTRACTION     Patient Active Problem List   Diagnosis Date Noted   Pain in left elbow 07/27/2020   Prediabetes 02/03/2019   Obstructive sleep apnea 04/23/2018   Other fatigue 11/26/2017   Shortness of breath on exertion 11/26/2017   Type 2 diabetes mellitus without complication, without long-term current use of insulin  (HCC) 11/26/2017   Other hyperlipidemia 11/26/2017   Vitamin D  deficiency 11/26/2017   Schizoaffective disorder, bipolar type (HCC) 09/14/2014   Intellectual disability 09/10/2014    PCP: Leigh Lung, MD  REFERRING PROVIDER:  02/19/2024 - Knees - Shari Sieving, MD 05/22/2024 - Verlena GLENWOOD Joya Asberry, DPM   REFERRING DIAG:  02/19/2024 - Hx of ACL Reconstruction  05/22/2024 -  M76.72 (ICD-10-CM) - Peroneal tendonitis, left M76.71 (ICD-10-CM) - Peroneal  tendonitis, right    THERAPY DIAG:  Pain in left ankle and joints of left foot  Pain in right ankle and joints of right foot  Chronic pain of left knee  Chronic pain of right knee  Muscle weakness (generalized)  Rationale for Evaluation and Treatment: Rehabilitation  ONSET DATE: Chronic  SUBJECTIVE:   SUBJECTIVE STATEMENT: Pt presents to PT with L knee pain today at 5/10 but no R knee or ankle pain. Has fallen twice since we last saw her, once she was tripped and the other she fell walking over uneven ground.   EVAL: Pt presents to PT with reports of chronic bilateral knee pain in presence of previous ACL repairs. She is well known to therapist and has been seen for both ACL recoveries in past. She currently has significant pain when standing for long periods of time, or when walking/squatting. Denies referral of symptoms past knee, pain is sharp and along bilateral knee joint lines.   PERTINENT HISTORY: Bipolar disorder, Depression, intellectual disability, bilateral ACL reconstruction   PAIN:  Are you having pain?  Yes: NPRS scale: currently 0/10, not at the moment Worst: 10/10 Pain location: bilateral inferior knee Pain description: sharp, sore Aggravating factors: standing, stairs,  Relieving factors: rest  PRECAUTIONS: None  RED FLAGS: None   WEIGHT BEARING RESTRICTIONS: No  FALLS:  Has patient fallen in last 6 months? -   LIVING  ENVIRONMENT: Lives with: lives with their family Lives in: House/apartment Stairs: 4 STE - handrail  Has following equipment at home: None  OCCUPATION: Disability  PLOF: Independent with basic ADLs  PATIENT GOALS: decrease knee pain, improve strength and be able to work out  NEXT MD VISIT: PRN  OBJECTIVE:  Note: Objective measures were completed at Evaluation unless otherwise noted.  DIAGNOSTIC FINDINGS: N/A  PATIENT SURVEYS:  LEFS: 34/80 05/28/2024: 54/80  COGNITION: Overall cognitive status: Within functional  limits for tasks assessed     SENSATION: WFL  POSTURE: rounded shoulders, forward head, increased lumbar lordosis, and large body habitus  PALPATION: TTP to distal quad  LOWER EXTREMITY ROM:  Active ROM Right eval Left eval  Hip flexion    Hip extension    Hip abduction    Hip adduction    Hip internal rotation    Hip external rotation    Knee flexion 114 118  Knee extension 0 5  Ankle dorsiflexion    Ankle plantarflexion    Ankle inversion    Ankle eversion     (Blank rows = not tested)  LOWER EXTREMITY MMT:  MMT Right eval Left eval Right 04/28/24 Left 04/28/24  Hip flexion 3+ 3+ 4 4  Hip extension      Hip abduction 3+ 3+ 4 4  Hip adduction      Hip internal rotation      Hip external rotation      Knee flexion 3+ 3+ 4 4  Knee extension 3+ 3+ 4 4  Ankle dorsiflexion      Ankle plantarflexion      Ankle inversion      Ankle eversion       (Blank rows = not tested)  LOWER EXTREMITY SPECIAL TESTS:  Knee special tests: Lachman Test: negative  FUNCTIONAL TESTS: 30 Second Sit to Stand: 8 reps 04/09/2024: 13 reps  06/04/24:  SLS: 3 sec each  GAIT: Distance walked: 71ft Assistive device utilized: None Level of assistance: Complete Independence Comments: decreased gait speed, antalgia L  TREATMENT: [facility] Adult PT Treatment:                                                DATE: 06/23/24 Therapeutic Exercise: Bike level 3 x 5 mins for functional activity tolerance STS on foam 2x10 - no UE Standing hip abd 2x10 37.5# TRX squat 3x5 Seated heel raise 2x15 25# KB Neuromuscular re-ed: Semi-Tandem stance 2x30 each FT stance on Airex 2x30 Marching on Airex 3x30  [facility] Adult PT Treatment:                                                DATE: 06/18/24 Therapeutic Exercise: Bike level 3 x 5 mins for functional activity tolerance Heel raises on cybex hip machine x 20 Standing hip abd 2x10 30# Leg press GTB 3x10 80# TRX squat 3x5 Seated knee ext 2x10  30# Neuromuscular re-ed: Semi-Tandem stance 2x30 each FT stance on Airex 2x30 Marching on Airex 3x30  [facility] Adult PT Treatment:  DATE: 06/16/24 Therapeutic Exercise: Bike level 3 x 5 mins for functional activity tolerance Heel-toe raise x 20 Standing hip abd 3x10 30# Leg press GTB 3x10 60# TRX squat 3x5 Seated knee ext 2x10 30# Neuromuscular re-ed: Tandem stance x30 each   [facility] Adult PT Treatment:                                                DATE: 06/04/24 Bike level 3 x 4 mins for functional activity tolerance Tandem stance x 30 each Heel-toe raise x 20 Standing hip abd 3x10 30# Leg press GTB 3x10 60# TRX squat 3x5 STS 2x10 15# KB Seated knee ext 3x10 30# Assessment of tests/measures   PATIENT EDUCATION:  Education details: eval findings, LEFS, HEP, POC Person educated: Patient Education method: Explanation, Demonstration, and Handouts Education comprehension: verbalized understanding and returned demonstration  HOME EXERCISE PROGRAM: Access Code: [email or web address] URL: [email or web address] Date: 03/04/2024 Prepared by: Alm Kingdom  Exercises - Supine Heel Slide  - 1 x daily - 7 x weekly - 2 sets - 10 reps - 5 sec hold - Supine Quadricep Sets  - 1 x daily - 7 x weekly - 2 sets - 10 reps - 5 sec hold - Active Straight Leg Raise with Quad Set  - 1 x daily - 7 x weekly - 3-4 sets - 5 reps - Sidelying Hip Abduction  - 1 x daily - 7 x weekly - 3-4 sets - 5 reps  ASSESSMENT:  CLINICAL IMPRESSION: Pt was able to complete prescribed exercises with no adverse effect. We continued to progress LE strengthening and balance for knee and ankle. Pt is progressing with therapy, will continue per POC.  EVAL: Patient is a 33 y.o. F who was seen today for physical therapy evaluation and treatment for bilateral knee pain and discomfort. Physical findings are consistent with injury/surgical hx along with bilateral LE weakness  particularly in lateral hip and quad. Her LEFS score shows moderate disability in performance of home ADLs and community level activities. Pt would benefit from skilled PT services with specific focus on LE strengthening for improving movement and decreasing knee pain.   OBJECTIVE IMPAIRMENTS: Abnormal gait, decreased activity tolerance, decreased mobility, difficulty walking, decreased ROM, decreased strength, and pain  ACTIVITY LIMITATIONS: carrying, lifting, sitting, standing, squatting, stairs, transfers, and locomotion level  PARTICIPATION LIMITATIONS: meal prep, cleaning, driving, shopping, community activity, and yard work  PERSONAL FACTORS: Time since onset of injury/illness/exacerbation and 3+ comorbidities: Bipolar disorder, Depression, intellectual disability, bilateral ACL reconstruction  are also affecting patient's functional outcome.   REHAB POTENTIAL: Good  CLINICAL DECISION MAKING: Evolving/moderate complexity  EVALUATION COMPLEXITY: Moderate   GOALS: Goals reviewed with patient? No  SHORT TERM GOALS: Target date: 03/25/2024   Pt will be compliant and knowledgeable with initial HEP for improved comfort and carryover Baseline: initial HEP given  Goal status: MET Pt reports compliance 03/26/24  2.  Pt will self report bilateral knee pain no greater than 7/10 for improved comfort and functional ability Baseline: 10/10 at worst 04/09/2024: 6/10 Goal status: MET  LONG TERM GOALS: Target date: 07/16/2024    Pt will improve LEFS to no less than 50/80 as proxy for functional improvement with home ADLs and higher level community activity Baseline: 34/80 04/09/2024: 36/80 05/28/2024: 54/80 Goal status: MET  2.  Pt will self report bilateral knee pain no greater  than 3/10 for improved comfort and functional ability Baseline: 10/10 at worst 04/09/2024: 6/10 04/28/2024: 5/10 Goal status: IN PROGRESS   3.  Pt will increase 30 Second Sit to Stand rep count to no less than 12  reps for improved balance, strength, and functional mobility Baseline: 8 reps 04/09/2024: 12 reps 05/26/2024: 14 reps Goal status: MET   4.  Pt will improve all LE MMT to no less than 4/5 for all tested motions for improved knee stabilization and decreased pain Baseline: see MMT chart Goal status: MET  5.  Pt will be able to squat with hands to floor without increase in bilateral knee pain for improved comfort and functional ability  Baseline: unable 04/28/2024: slight loss of balance, able to pick up small box Goal status: PARTIALLY MET    6.  Pt will be able to ambulate 2 miles without increase in knee pain or any instance of knee buckling for improve comfort and function Baseline: 1 miles Goal status: INITIAL  7.  Pt will improve bilateral SLS time to at least 20 seconds each for improved balance and ankle stability Baseline: 3 seconds each Goal status: INITIAL   PLAN:  PT FREQUENCY: 1-2x/week  PT DURATION: 6 weeks  PLANNED INTERVENTIONS: 97164- PT Re-evaluation, 97110-Therapeutic exercises, 97530- Therapeutic activity, [id number]- Neuromuscular re-education, 97535- Self Care, [id number]- Manual therapy, [id number]- Gait training, [id number]- Electrical stimulation (unattended), [id number]- Electrical stimulation (manual), 97016- Vasopneumatic device, Cryotherapy, and Moist heat  PLAN FOR NEXT SESSION: assess HEP response, quad and hip strengthening, progress functional lifting movements    Alm JAYSON Kingdom, PT 06/24/2024, 7:55 AM

## 2024-06-25 ENCOUNTER — Ambulatory Visit

## 2024-06-25 DIAGNOSIS — M6281 Muscle weakness (generalized): Secondary | ICD-10-CM

## 2024-06-25 DIAGNOSIS — M25572 Pain in left ankle and joints of left foot: Secondary | ICD-10-CM | POA: Diagnosis not present

## 2024-06-25 DIAGNOSIS — G8929 Other chronic pain: Secondary | ICD-10-CM

## 2024-06-25 DIAGNOSIS — M25571 Pain in right ankle and joints of right foot: Secondary | ICD-10-CM

## 2024-06-25 NOTE — Therapy (Signed)
 OUTPATIENT PHYSICAL THERAPY TREATMENT      Patient Name: Cindy Phillips MRN: [id number] DOB:December 08, 1990, 33 y.o., female Today's Date: 06/26/2024  END OF SESSION:  PT End of Session - 06/25/24 1359     Visit Number 25    Number of Visits 35    Date for Recertification  07/16/24    PT Start Time 1440    PT Stop Time 1520    PT Time Calculation (min) 40 min    Activity Tolerance Patient tolerated treatment well    Behavior During Therapy WFL for tasks assessed/performed            Past Medical History:  Diagnosis Date   Arthritis    Bipolar 1 disorder (HCC)    Bowel incontinence    Depression    Diabetes mellitus without complication (HCC)    Headache    migraines   Mental disability    Sleep apnea    Past Surgical History:  Procedure Laterality Date   ANTERIOR CRUCIATE LIGAMENT REPAIR Right 05/11/2022   Procedure: RIGHT KNEE ARTHROSCOPY; LATERAL MENISECTOMY; ANTERIOR CRUCIATE LIGAMENT (ACL) REPAIR; CHONDROPLASTY;  Surgeon: Shari Sieving, MD;  Location: [facility];  Service: Orthopedics;  Laterality: Right;   FOOT SURGERY     KNEE ARTHROSCOPY W/ ACL RECONSTRUCTION Left 10/12/2020   TOOTH EXTRACTION     Patient Active Problem List   Diagnosis Date Noted   Pain in left elbow 07/27/2020   Prediabetes 02/03/2019   Obstructive sleep apnea 04/23/2018   Other fatigue 11/26/2017   Shortness of breath on exertion 11/26/2017   Type 2 diabetes mellitus without complication, without long-term current use of insulin  (HCC) 11/26/2017   Other hyperlipidemia 11/26/2017   Vitamin D  deficiency 11/26/2017   Schizoaffective disorder, bipolar type (HCC) 09/14/2014   Intellectual disability 09/10/2014    PCP: Leigh Lung, MD  REFERRING PROVIDER:  02/19/2024 - Knees - Shari Sieving, MD 05/22/2024 - Verlena GLENWOOD Joya Asberry, DPM   REFERRING DIAG:  02/19/2024 - Hx of ACL Reconstruction  05/22/2024 -  M76.72 (ICD-10-CM) - Peroneal tendonitis, left M76.71 (ICD-10-CM) - Peroneal  tendonitis, right    THERAPY DIAG:  Pain in left ankle and joints of left foot  Pain in right ankle and joints of right foot  Chronic pain of left knee  Chronic pain of right knee  Muscle weakness (generalized)  Rationale for Evaluation and Treatment: Rehabilitation  ONSET DATE: Chronic  SUBJECTIVE:   SUBJECTIVE STATEMENT: Pt presents to PT with reports of L knee and L ankle pain. She has been compliant with HEP.  EVAL: Pt presents to PT with reports of chronic bilateral knee pain in presence of previous ACL repairs. She is well known to therapist and has been seen for both ACL recoveries in past. She currently has significant pain when standing for long periods of time, or when walking/squatting. Denies referral of symptoms past knee, pain is sharp and along bilateral knee joint lines.   PERTINENT HISTORY: Bipolar disorder, Depression, intellectual disability, bilateral ACL reconstruction   PAIN:  Are you having pain?  Yes: NPRS scale: currently 0/10, not at the moment Worst: 10/10 Pain location: bilateral inferior knee Pain description: sharp, sore Aggravating factors: standing, stairs,  Relieving factors: rest  PRECAUTIONS: None  RED FLAGS: None   WEIGHT BEARING RESTRICTIONS: No  FALLS:  Has patient fallen in last 6 months? -   LIVING ENVIRONMENT: Lives with: lives with their family Lives in: House/apartment Stairs: 4 STE - handrail  Has following equipment at  home: None  OCCUPATION: Disability  PLOF: Independent with basic ADLs  PATIENT GOALS: decrease knee pain, improve strength and be able to work out  NEXT MD VISIT: PRN  OBJECTIVE:  Note: Objective measures were completed at Evaluation unless otherwise noted.  DIAGNOSTIC FINDINGS: N/A  PATIENT SURVEYS:  LEFS: 34/80 05/28/2024: 54/80  COGNITION: Overall cognitive status: Within functional limits for tasks assessed     SENSATION: WFL  POSTURE: rounded shoulders, forward head, increased  lumbar lordosis, and large body habitus  PALPATION: TTP to distal quad  LOWER EXTREMITY ROM:  Active ROM Right eval Left eval  Hip flexion    Hip extension    Hip abduction    Hip adduction    Hip internal rotation    Hip external rotation    Knee flexion 114 118  Knee extension 0 5  Ankle dorsiflexion    Ankle plantarflexion    Ankle inversion    Ankle eversion     (Blank rows = not tested)  LOWER EXTREMITY MMT:  MMT Right eval Left eval Right 04/28/24 Left 04/28/24  Hip flexion 3+ 3+ 4 4  Hip extension      Hip abduction 3+ 3+ 4 4  Hip adduction      Hip internal rotation      Hip external rotation      Knee flexion 3+ 3+ 4 4  Knee extension 3+ 3+ 4 4  Ankle dorsiflexion      Ankle plantarflexion      Ankle inversion      Ankle eversion       (Blank rows = not tested)  LOWER EXTREMITY SPECIAL TESTS:  Knee special tests: Lachman Test: negative  FUNCTIONAL TESTS: 30 Second Sit to Stand: 8 reps 04/09/2024: 13 reps  06/04/24:  SLS: 3 sec each  GAIT: Distance walked: 5ft Assistive device utilized: None Level of assistance: Complete Independence Comments: decreased gait speed, antalgia L  TREATMENT: [facility] Adult PT Treatment:                                                DATE: 06/26/24 Bike level 3 x 5 mins for functional activity tolerance Semi-Tandem stance on foam 2x30 each SLS 2x30 ea Functional squats 2x10 B UE Seated hamstring curl 2x10 35# Standing hip abd 2x10 37.5# STS on foam 2x10 - no UE Ankle inv/eversion 2x10 GTB Seated heel raise with ball 2x20  [facility] Adult PT Treatment:                                                DATE: 06/18/24 Therapeutic Exercise: Bike level 3 x 5 mins for functional activity tolerance Heel raises on cybex hip machine x 20 Standing hip abd 2x10 30# Leg press GTB 3x10 80# TRX squat 3x5 Seated knee ext 2x10 30# Neuromuscular re-ed: Semi-Tandem stance 2x30 each FT stance on Airex 2x30 Marching on Airex  3x30  PATIENT EDUCATION:  Education details: eval findings, LEFS, HEP, POC Person educated: Patient Education method: Explanation, Demonstration, and Handouts Education comprehension: verbalized understanding and returned demonstration  HOME EXERCISE PROGRAM: Access Code: [email or web address] URL: [email or web address] Date: 06/25/2024 Prepared by: Alm Kingdom  Exercises - Supine Heel Slide  - 1 x daily - 7  x weekly - 2 sets - 10 reps - 5 sec hold - Supine Quadricep Sets  - 1 x daily - 7 x weekly - 2 sets - 10 reps - 5 sec hold - Active Straight Leg Raise with Quad Set  - 1 x daily - 7 x weekly - 3-4 sets - 5 reps - Sidelying Hip Abduction  - 1 x daily - 7 x weekly - 3-4 sets - 5 reps - Tandem Stance  - 1 x daily - 7 x weekly - 2-3 reps - 30 sec hold - Heel Toe Raises with Counter Support  - 1 x daily - 7 x weekly - 2 sets - 15 reps - Long Sitting Ankle Inversion with Anchored Resistance  - 1 x daily - 7 x weekly - 3 sets - 10 reps - green band hold - Seated Calf Raise With Small Ball at Heels  - 1 x daily - 7 x weekly - 3 sets - 15 reps  ASSESSMENT:  CLINICAL IMPRESSION: Pt was able to complete prescribed exercises with no adverse effect. We continued to progress LE strengthening and balance for knee and ankle. HEP updated for continued ankle strengthening. Pt is progressing with therapy, will continue per POC.   EVAL: Patient is a 33 y.o. F who was seen today for physical therapy evaluation and treatment for bilateral knee pain and discomfort. Physical findings are consistent with injury/surgical hx along with bilateral LE weakness particularly in lateral hip and quad. Her LEFS score shows moderate disability in performance of home ADLs and community level activities. Pt would benefit from skilled PT services with specific focus on LE strengthening for improving movement and decreasing knee pain.   OBJECTIVE IMPAIRMENTS: Abnormal gait, decreased activity tolerance, decreased  mobility, difficulty walking, decreased ROM, decreased strength, and pain  ACTIVITY LIMITATIONS: carrying, lifting, sitting, standing, squatting, stairs, transfers, and locomotion level  PARTICIPATION LIMITATIONS: meal prep, cleaning, driving, shopping, community activity, and yard work  PERSONAL FACTORS: Time since onset of injury/illness/exacerbation and 3+ comorbidities: Bipolar disorder, Depression, intellectual disability, bilateral ACL reconstruction  are also affecting patient's functional outcome.   REHAB POTENTIAL: Good  CLINICAL DECISION MAKING: Evolving/moderate complexity  EVALUATION COMPLEXITY: Moderate   GOALS: Goals reviewed with patient? No  SHORT TERM GOALS: Target date: 03/25/2024   Pt will be compliant and knowledgeable with initial HEP for improved comfort and carryover Baseline: initial HEP given  Goal status: MET Pt reports compliance 03/26/24  2.  Pt will self report bilateral knee pain no greater than 7/10 for improved comfort and functional ability Baseline: 10/10 at worst 04/09/2024: 6/10 Goal status: MET  LONG TERM GOALS: Target date: 07/16/2024    Pt will improve LEFS to no less than 50/80 as proxy for functional improvement with home ADLs and higher level community activity Baseline: 34/80 04/09/2024: 36/80 05/28/2024: 54/80 Goal status: MET  2.  Pt will self report bilateral knee pain no greater than 3/10 for improved comfort and functional ability Baseline: 10/10 at worst 04/09/2024: 6/10 04/28/2024: 5/10 Goal status: IN PROGRESS   3.  Pt will increase 30 Second Sit to Stand rep count to no less than 12 reps for improved balance, strength, and functional mobility Baseline: 8 reps 04/09/2024: 12 reps 05/26/2024: 14 reps Goal status: MET   4.  Pt will improve all LE MMT to no less than 4/5 for all tested motions for improved knee stabilization and decreased pain Baseline: see MMT chart Goal status: MET  5.  Pt will be  able to squat with hands to  floor without increase in bilateral knee pain for improved comfort and functional ability  Baseline: unable 04/28/2024: slight loss of balance, able to pick up small box Goal status: PARTIALLY MET    6.  Pt will be able to ambulate 2 miles without increase in knee pain or any instance of knee buckling for improve comfort and function Baseline: 1 miles Goal status: INITIAL  7.  Pt will improve bilateral SLS time to at least 20 seconds each for improved balance and ankle stability Baseline: 3 seconds each Goal status: INITIAL   PLAN:  PT FREQUENCY: 1-2x/week  PT DURATION: 6 weeks  PLANNED INTERVENTIONS: 97164- PT Re-evaluation, 97110-Therapeutic exercises, 97530- Therapeutic activity, [id number]- Neuromuscular re-education, 97535- Self Care, [id number]- Manual therapy, [id number]- Gait training, [id number]- Electrical stimulation (unattended), [id number]- Electrical stimulation (manual), 97016- Vasopneumatic device, Cryotherapy, and Moist heat  PLAN FOR NEXT SESSION: assess HEP response, quad and hip strengthening, progress functional lifting movements    Alm JAYSON Kingdom, PT 06/26/2024, 8:11 AM

## 2024-06-30 ENCOUNTER — Ambulatory Visit

## 2024-06-30 DIAGNOSIS — G8929 Other chronic pain: Secondary | ICD-10-CM

## 2024-06-30 DIAGNOSIS — M25572 Pain in left ankle and joints of left foot: Secondary | ICD-10-CM | POA: Diagnosis not present

## 2024-06-30 DIAGNOSIS — M25571 Pain in right ankle and joints of right foot: Secondary | ICD-10-CM

## 2024-06-30 DIAGNOSIS — M6281 Muscle weakness (generalized): Secondary | ICD-10-CM

## 2024-06-30 NOTE — Therapy (Signed)
 OUTPATIENT PHYSICAL THERAPY TREATMENT      Patient Name: SUNI JARNAGIN MRN: [id number] DOB:02-20-1991, 33 y.o., female Today's Date: 06/30/2024  END OF SESSION:  PT End of Session - 06/30/24 1406     Visit Number 26    Number of Visits 35    Date for Recertification  07/16/24    Authorization Type [employer] Dual Complete    PT Start Time 1400    PT Stop Time 1440    PT Time Calculation (min) 40 min    Activity Tolerance Patient tolerated treatment well    Behavior During Therapy WFL for tasks assessed/performed          Past Medical History:  Diagnosis Date   Arthritis    Bipolar 1 disorder (HCC)    Bowel incontinence    Depression    Diabetes mellitus without complication (HCC)    Headache    migraines   Mental disability    Sleep apnea    Past Surgical History:  Procedure Laterality Date   ANTERIOR CRUCIATE LIGAMENT REPAIR Right 05/11/2022   Procedure: RIGHT KNEE ARTHROSCOPY; LATERAL MENISECTOMY; ANTERIOR CRUCIATE LIGAMENT (ACL) REPAIR; CHONDROPLASTY;  Surgeon: Shari Sieving, MD;  Location: [facility];  Service: Orthopedics;  Laterality: Right;   FOOT SURGERY     KNEE ARTHROSCOPY W/ ACL RECONSTRUCTION Left 10/12/2020   TOOTH EXTRACTION     Patient Active Problem List   Diagnosis Date Noted   Pain in left elbow 07/27/2020   Prediabetes 02/03/2019   Obstructive sleep apnea 04/23/2018   Other fatigue 11/26/2017   Shortness of breath on exertion 11/26/2017   Type 2 diabetes mellitus without complication, without long-term current use of insulin  (HCC) 11/26/2017   Other hyperlipidemia 11/26/2017   Vitamin D  deficiency 11/26/2017   Schizoaffective disorder, bipolar type (HCC) 09/14/2014   Intellectual disability 09/10/2014    PCP: Leigh Lung, MD  REFERRING PROVIDER:  02/19/2024 - Knees - Shari Sieving, MD 05/22/2024 - Verlena GLENWOOD Joya Asberry, DPM   REFERRING DIAG:  02/19/2024 - Hx of ACL Reconstruction  05/22/2024 -  M76.72 (ICD-10-CM) - Peroneal tendonitis,  left M76.71 (ICD-10-CM) - Peroneal tendonitis, right    THERAPY DIAG:  Pain in left ankle and joints of left foot  Pain in right ankle and joints of right foot  Chronic pain of left knee  Chronic pain of right knee  Muscle weakness (generalized)  Rationale for Evaluation and Treatment: Rehabilitation  ONSET DATE: Chronic  SUBJECTIVE:   SUBJECTIVE STATEMENT: Patient reports that she has been having some pain in her left knee recently.  EVAL: Pt presents to PT with reports of chronic bilateral knee pain in presence of previous ACL repairs. She is well known to therapist and has been seen for both ACL recoveries in past. She currently has significant pain when standing for long periods of time, or when walking/squatting. Denies referral of symptoms past knee, pain is sharp and along bilateral knee joint lines.   PERTINENT HISTORY: Bipolar disorder, Depression, intellectual disability, bilateral ACL reconstruction   PAIN:  Are you having pain?  Yes: NPRS scale: currently 0/10, not at the moment Worst: 10/10 Pain location: bilateral inferior knee Pain description: sharp, sore Aggravating factors: standing, stairs,  Relieving factors: rest  PRECAUTIONS: None  RED FLAGS: None   WEIGHT BEARING RESTRICTIONS: No  FALLS:  Has patient fallen in last 6 months? -   LIVING ENVIRONMENT: Lives with: lives with their family Lives in: House/apartment Stairs: 4 STE - handrail  Has following equipment  at home: None  OCCUPATION: Disability  PLOF: Independent with basic ADLs  PATIENT GOALS: decrease knee pain, improve strength and be able to work out  NEXT MD VISIT: PRN  OBJECTIVE:  Note: Objective measures were completed at Evaluation unless otherwise noted.  DIAGNOSTIC FINDINGS: N/A  PATIENT SURVEYS:  LEFS: 34/80 05/28/2024: 54/80  COGNITION: Overall cognitive status: Within functional limits for tasks assessed     SENSATION: WFL  POSTURE: rounded shoulders,  forward head, increased lumbar lordosis, and large body habitus  PALPATION: TTP to distal quad  LOWER EXTREMITY ROM:  Active ROM Right eval Left eval  Hip flexion    Hip extension    Hip abduction    Hip adduction    Hip internal rotation    Hip external rotation    Knee flexion 114 118  Knee extension 0 5  Ankle dorsiflexion    Ankle plantarflexion    Ankle inversion    Ankle eversion     (Blank rows = not tested)  LOWER EXTREMITY MMT:  MMT Right eval Left eval Right 04/28/24 Left 04/28/24  Hip flexion 3+ 3+ 4 4  Hip extension      Hip abduction 3+ 3+ 4 4  Hip adduction      Hip internal rotation      Hip external rotation      Knee flexion 3+ 3+ 4 4  Knee extension 3+ 3+ 4 4  Ankle dorsiflexion      Ankle plantarflexion      Ankle inversion      Ankle eversion       (Blank rows = not tested)  LOWER EXTREMITY SPECIAL TESTS:  Knee special tests: Lachman Test: negative  FUNCTIONAL TESTS: 30 Second Sit to Stand: 8 reps 04/09/2024: 13 reps  06/04/24:  SLS: 3 sec each  GAIT: Distance walked: 28ft Assistive device utilized: None Level of assistance: Complete Independence Comments: decreased gait speed, antalgia L  TREATMENT: [facility] Adult PT Treatment:                                                DATE: 06/30/24 (Ankle braces doffed for all exercises today) Therapeutic Exercise: Treadmill 1.4 speed x 5 mins for functional activity tolerance Seated hamstring curl 2x10 35# x10 45# Slant board gastroc stretch 2x30 Ankle inv/eversion 2x10 GTB Neuromuscular re-ed: Semi-Tandem stance on foam 2x30 each - occasional LOB, able to self correct SLS 2x30 ea Therapeutic Activity: Functional squats 2x10 B UE Standing hip abd 2x10 37.5#  [facility] Adult PT Treatment:                                                DATE: 06/26/24 Bike level 3 x 5 mins for functional activity tolerance Semi-Tandem stance on foam 2x30 each SLS 2x30 ea Functional squats 2x10 B  UE Seated hamstring curl 2x10 35# Standing hip abd 2x10 37.5# STS on foam 2x10 - no UE Ankle inv/eversion 2x10 GTB Seated heel raise with ball 2x20  [facility] Adult PT Treatment:  DATE: 06/18/24 Therapeutic Exercise: Bike level 3 x 5 mins for functional activity tolerance Heel raises on cybex hip machine x 20 Standing hip abd 2x10 30# Leg press GTB 3x10 80# TRX squat 3x5 Seated knee ext 2x10 30# Neuromuscular re-ed: Semi-Tandem stance 2x30 each FT stance on Airex 2x30 Marching on Airex 3x30  PATIENT EDUCATION:  Education details: eval findings, LEFS, HEP, POC Person educated: Patient Education method: Explanation, Demonstration, and Handouts Education comprehension: verbalized understanding and returned demonstration  HOME EXERCISE PROGRAM: Access Code: [email or web address] URL: [email or web address] Date: 06/25/2024 Prepared by: Alm Kingdom  Exercises - Supine Heel Slide  - 1 x daily - 7 x weekly - 2 sets - 10 reps - 5 sec hold - Supine Quadricep Sets  - 1 x daily - 7 x weekly - 2 sets - 10 reps - 5 sec hold - Active Straight Leg Raise with Quad Set  - 1 x daily - 7 x weekly - 3-4 sets - 5 reps - Sidelying Hip Abduction  - 1 x daily - 7 x weekly - 3-4 sets - 5 reps - Tandem Stance  - 1 x daily - 7 x weekly - 2-3 reps - 30 sec hold - Heel Toe Raises with Counter Support  - 1 x daily - 7 x weekly - 2 sets - 15 reps - Long Sitting Ankle Inversion with Anchored Resistance  - 1 x daily - 7 x weekly - 3 sets - 10 reps - green band hold - Seated Calf Raise With Small Ball at Heels  - 1 x daily - 7 x weekly - 3 sets - 15 reps  ASSESSMENT:  CLINICAL IMPRESSION: Patient presents to PT reporting some pain in the left knee. Today's session focused on LE strengthening for the knee and ankle. Patient tolerated treatment well, and was able to tolerate the exercises without having her ankle braces on. Patient will benefit from skilled PT to  increase mobility and functional abilities.  EVAL: Patient is a 33 y.o. F who was seen today for physical therapy evaluation and treatment for bilateral knee pain and discomfort. Physical findings are consistent with injury/surgical hx along with bilateral LE weakness particularly in lateral hip and quad. Her LEFS score shows moderate disability in performance of home ADLs and community level activities. Pt would benefit from skilled PT services with specific focus on LE strengthening for improving movement and decreasing knee pain.   OBJECTIVE IMPAIRMENTS: Abnormal gait, decreased activity tolerance, decreased mobility, difficulty walking, decreased ROM, decreased strength, and pain  ACTIVITY LIMITATIONS: carrying, lifting, sitting, standing, squatting, stairs, transfers, and locomotion level  PARTICIPATION LIMITATIONS: meal prep, cleaning, driving, shopping, community activity, and yard work  PERSONAL FACTORS: Time since onset of injury/illness/exacerbation and 3+ comorbidities: Bipolar disorder, Depression, intellectual disability, bilateral ACL reconstruction  are also affecting patient's functional outcome.   REHAB POTENTIAL: Good  CLINICAL DECISION MAKING: Evolving/moderate complexity  EVALUATION COMPLEXITY: Moderate   GOALS: Goals reviewed with patient? No  SHORT TERM GOALS: Target date: 03/25/2024   Pt will be compliant and knowledgeable with initial HEP for improved comfort and carryover Baseline: initial HEP given  Goal status: MET Pt reports compliance 03/26/24  2.  Pt will self report bilateral knee pain no greater than 7/10 for improved comfort and functional ability Baseline: 10/10 at worst 04/09/2024: 6/10 Goal status: MET  LONG TERM GOALS: Target date: 07/16/2024    Pt will improve LEFS to no less than 50/80 as proxy for functional improvement with home ADLs and higher  level community activity Baseline: 34/80 04/09/2024: 36/80 05/28/2024: 54/80 Goal status: MET  2.   Pt will self report bilateral knee pain no greater than 3/10 for improved comfort and functional ability Baseline: 10/10 at worst 04/09/2024: 6/10 04/28/2024: 5/10 Goal status: IN PROGRESS   3.  Pt will increase 30 Second Sit to Stand rep count to no less than 12 reps for improved balance, strength, and functional mobility Baseline: 8 reps 04/09/2024: 12 reps 05/26/2024: 14 reps Goal status: MET   4.  Pt will improve all LE MMT to no less than 4/5 for all tested motions for improved knee stabilization and decreased pain Baseline: see MMT chart Goal status: MET  5.  Pt will be able to squat with hands to floor without increase in bilateral knee pain for improved comfort and functional ability  Baseline: unable 04/28/2024: slight loss of balance, able to pick up small box Goal status: PARTIALLY MET    6.  Pt will be able to ambulate 2 miles without increase in knee pain or any instance of knee buckling for improve comfort and function Baseline: 1 miles Goal status: INITIAL  7.  Pt will improve bilateral SLS time to at least 20 seconds each for improved balance and ankle stability Baseline: 3 seconds each Goal status: INITIAL   PLAN:  PT FREQUENCY: 1-2x/week  PT DURATION: 6 weeks  PLANNED INTERVENTIONS: 97164- PT Re-evaluation, 97110-Therapeutic exercises, 97530- Therapeutic activity, [id number]- Neuromuscular re-education, 97535- Self Care, [id number]- Manual therapy, [id number]- Gait training, H9716- Electrical stimulation (unattended), [id number]- Electrical stimulation (manual), 97016- Vasopneumatic device, Cryotherapy, and Moist heat  PLAN FOR NEXT SESSION: assess HEP response, quad and hip strengthening, progress functional lifting movements    Shanda Code 06/30/2024, 2:53 PM

## 2024-07-01 ENCOUNTER — Ambulatory Visit (INDEPENDENT_AMBULATORY_CARE_PROVIDER_SITE_OTHER): Admitting: Podiatry

## 2024-07-01 DIAGNOSIS — Z91199 Patient's noncompliance with other medical treatment and regimen due to unspecified reason: Secondary | ICD-10-CM

## 2024-07-01 NOTE — Progress Notes (Signed)
 Cancel 24 hours

## 2024-07-02 ENCOUNTER — Ambulatory Visit

## 2024-07-08 ENCOUNTER — Ambulatory Visit

## 2024-07-08 DIAGNOSIS — M25572 Pain in left ankle and joints of left foot: Secondary | ICD-10-CM

## 2024-07-08 DIAGNOSIS — M25571 Pain in right ankle and joints of right foot: Secondary | ICD-10-CM

## 2024-07-08 DIAGNOSIS — R2689 Other abnormalities of gait and mobility: Secondary | ICD-10-CM

## 2024-07-08 DIAGNOSIS — M6281 Muscle weakness (generalized): Secondary | ICD-10-CM

## 2024-07-08 DIAGNOSIS — G8929 Other chronic pain: Secondary | ICD-10-CM

## 2024-07-08 NOTE — Therapy (Signed)
 OUTPATIENT PHYSICAL THERAPY TREATMENT      Patient Name: Cindy Phillips MRN: [id number] DOB:08/01/1991, 32 y.o., female Today's Date: 07/08/2024  END OF SESSION:  PT End of Session - 07/08/24 1357     Visit Number 27    Number of Visits 35    Date for Recertification  07/16/24    Authorization Type [employer] Dual Complete    PT Start Time 1359    PT Stop Time 1439    PT Time Calculation (min) 40 min    Activity Tolerance Patient tolerated treatment well    Behavior During Therapy WFL for tasks assessed/performed           Past Medical History:  Diagnosis Date   Arthritis    Bipolar 1 disorder (HCC)    Bowel incontinence    Depression    Diabetes mellitus without complication (HCC)    Headache    migraines   Mental disability    Sleep apnea    Past Surgical History:  Procedure Laterality Date   ANTERIOR CRUCIATE LIGAMENT REPAIR Right 05/11/2022   Procedure: RIGHT KNEE ARTHROSCOPY; LATERAL MENISECTOMY; ANTERIOR CRUCIATE LIGAMENT (ACL) REPAIR; CHONDROPLASTY;  Surgeon: Shari Sieving, MD;  Location: [facility];  Service: Orthopedics;  Laterality: Right;   FOOT SURGERY     KNEE ARTHROSCOPY W/ ACL RECONSTRUCTION Left 10/12/2020   TOOTH EXTRACTION     Patient Active Problem List   Diagnosis Date Noted   Pain in left elbow 07/27/2020   Prediabetes 02/03/2019   Obstructive sleep apnea 04/23/2018   Other fatigue 11/26/2017   Shortness of breath on exertion 11/26/2017   Type 2 diabetes mellitus without complication, without long-term current use of insulin  (HCC) 11/26/2017   Other hyperlipidemia 11/26/2017   Vitamin D  deficiency 11/26/2017   Schizoaffective disorder, bipolar type (HCC) 09/14/2014   Intellectual disability 09/10/2014    PCP: Leigh Lung, MD  REFERRING PROVIDER:  02/19/2024 - Knees - Shari Sieving, MD 05/22/2024 - Verlena GLENWOOD Joya Asberry, DPM   REFERRING DIAG:  02/19/2024 - Hx of ACL Reconstruction  05/22/2024 -  M76.72 (ICD-10-CM) - Peroneal  tendonitis, left M76.71 (ICD-10-CM) - Peroneal tendonitis, right    THERAPY DIAG:  Pain in left ankle and joints of left foot  Pain in right ankle and joints of right foot  Chronic pain of left knee  Chronic pain of right knee  Muscle weakness (generalized)  Other abnormalities of gait and mobility  Rationale for Evaluation and Treatment: Rehabilitation  ONSET DATE: Chronic  SUBJECTIVE:   SUBJECTIVE STATEMENT: Patient reports that she has been having pain in her ankles and knees today. She is more fatigued from walking a long way on Sunday.  EVAL: Pt presents to PT with reports of chronic bilateral knee pain in presence of previous ACL repairs. She is well known to therapist and has been seen for both ACL recoveries in past. She currently has significant pain when standing for long periods of time, or when walking/squatting. Denies referral of symptoms past knee, pain is sharp and along bilateral knee joint lines.   PERTINENT HISTORY: Bipolar disorder, Depression, intellectual disability, bilateral ACL reconstruction   PAIN:  Are you having pain?  Yes: NPRS scale: currently 0/10, not at the moment Worst: 10/10 Pain location: bilateral inferior knee Pain description: sharp, sore Aggravating factors: standing, stairs,  Relieving factors: rest  PRECAUTIONS: None  RED FLAGS: None   WEIGHT BEARING RESTRICTIONS: No  FALLS:  Has patient fallen in last 6 months? -   LIVING  ENVIRONMENT: Lives with: lives with their family Lives in: House/apartment Stairs: 4 STE - handrail  Has following equipment at home: None  OCCUPATION: Disability  PLOF: Independent with basic ADLs  PATIENT GOALS: decrease knee pain, improve strength and be able to work out  NEXT MD VISIT: PRN  OBJECTIVE:  Note: Objective measures were completed at Evaluation unless otherwise noted.  DIAGNOSTIC FINDINGS: N/A  PATIENT SURVEYS:  LEFS: 34/80 05/28/2024: 54/80  COGNITION: Overall  cognitive status: Within functional limits for tasks assessed     SENSATION: WFL  POSTURE: rounded shoulders, forward head, increased lumbar lordosis, and large body habitus  PALPATION: TTP to distal quad  LOWER EXTREMITY ROM:  Active ROM Right eval Left eval  Hip flexion    Hip extension    Hip abduction    Hip adduction    Hip internal rotation    Hip external rotation    Knee flexion 114 118  Knee extension 0 5  Ankle dorsiflexion    Ankle plantarflexion    Ankle inversion    Ankle eversion     (Blank rows = not tested)  LOWER EXTREMITY MMT:  MMT Right eval Left eval Right 04/28/24 Left 04/28/24  Hip flexion 3+ 3+ 4 4  Hip extension      Hip abduction 3+ 3+ 4 4  Hip adduction      Hip internal rotation      Hip external rotation      Knee flexion 3+ 3+ 4 4  Knee extension 3+ 3+ 4 4  Ankle dorsiflexion      Ankle plantarflexion      Ankle inversion      Ankle eversion       (Blank rows = not tested)  LOWER EXTREMITY SPECIAL TESTS:  Knee special tests: Lachman Test: negative  FUNCTIONAL TESTS: 30 Second Sit to Stand: 8 reps 04/09/2024: 13 reps  06/04/24:  SLS: 3 sec each  GAIT: Distance walked: 51ft Assistive device utilized: None Level of assistance: Complete Independence Comments: decreased gait speed, antalgia L  TREATMENT: [facility] Adult PT Treatment:                                                DATE: 07/08/24 (Ankle braces donned for all exercises today) Therapeutic Exercise: Treadmill 1.4 speed x 5 mins for functional activity tolerance Seated hamstring curl 2x10 35# Seated knee extension 2x10 20# Recumbent leg press plate 4 [id number] GTB around knees Neuromuscular re-ed: Semi-Tandem stance on foam 2x30 each - occasional LOB, able to self correct SLS 2x30 ea (UE support today) Therapeutic Activity: Standing hip abd/ext 2x10 37.5#  [facility] Adult PT Treatment:                                                DATE: 06/30/24 (Ankle braces doffed  for all exercises today) Therapeutic Exercise: Treadmill 1.4 speed x 5 mins for functional activity tolerance Seated hamstring curl 2x10 35# x10 45# Slant board gastroc stretch 2x30 Ankle inv/eversion 2x10 GTB Neuromuscular re-ed: Semi-Tandem stance on foam 2x30 each - occasional LOB, able to self correct SLS 2x30 ea Therapeutic Activity: Functional squats 2x10 B UE Standing hip abd 2x10 37.5#  [facility] Adult PT Treatment:  DATE: 06/26/24 Bike level 3 x 5 mins for functional activity tolerance Semi-Tandem stance on foam 2x30 each SLS 2x30 ea Functional squats 2x10 B UE Seated hamstring curl 2x10 35# Standing hip abd 2x10 37.5# STS on foam 2x10 - no UE Ankle inv/eversion 2x10 GTB Seated heel raise with ball 2x20   PATIENT EDUCATION:  Education details: eval findings, LEFS, HEP, POC Person educated: Patient Education method: Explanation, Demonstration, and Handouts Education comprehension: verbalized understanding and returned demonstration  HOME EXERCISE PROGRAM: Access Code: [email or web address] URL: [email or web address] Date: 06/25/2024 Prepared by: Alm Kingdom  Exercises - Supine Heel Slide  - 1 x daily - 7 x weekly - 2 sets - 10 reps - 5 sec hold - Supine Quadricep Sets  - 1 x daily - 7 x weekly - 2 sets - 10 reps - 5 sec hold - Active Straight Leg Raise with Quad Set  - 1 x daily - 7 x weekly - 3-4 sets - 5 reps - Sidelying Hip Abduction  - 1 x daily - 7 x weekly - 3-4 sets - 5 reps - Tandem Stance  - 1 x daily - 7 x weekly - 2-3 reps - 30 sec hold - Heel Toe Raises with Counter Support  - 1 x daily - 7 x weekly - 2 sets - 15 reps - Long Sitting Ankle Inversion with Anchored Resistance  - 1 x daily - 7 x weekly - 3 sets - 10 reps - green band hold - Seated Calf Raise With Small Ball at Heels  - 1 x daily - 7 x weekly - 3 sets - 15 reps  ASSESSMENT:  CLINICAL IMPRESSION: Patient presents to PT reporting increased  soreness and fatigue in her knees and ankles from walking over 2.5 miles on Sunday. She also has to walk a long distance today so slightly regressed session to accommodate. Session today focused on LE strengthening and balance tasks to improve ankle proprioception. Patient was able to tolerate all prescribed exercises with no adverse effects. Patient continues to benefit from skilled PT services and should be progressed as able to improve functional independence.   EVAL: Patient is a 33 y.o. F who was seen today for physical therapy evaluation and treatment for bilateral knee pain and discomfort. Physical findings are consistent with injury/surgical hx along with bilateral LE weakness particularly in lateral hip and quad. Her LEFS score shows moderate disability in performance of home ADLs and community level activities. Pt would benefit from skilled PT services with specific focus on LE strengthening for improving movement and decreasing knee pain.   OBJECTIVE IMPAIRMENTS: Abnormal gait, decreased activity tolerance, decreased mobility, difficulty walking, decreased ROM, decreased strength, and pain  ACTIVITY LIMITATIONS: carrying, lifting, sitting, standing, squatting, stairs, transfers, and locomotion level  PARTICIPATION LIMITATIONS: meal prep, cleaning, driving, shopping, community activity, and yard work  PERSONAL FACTORS: Time since onset of injury/illness/exacerbation and 3+ comorbidities: Bipolar disorder, Depression, intellectual disability, bilateral ACL reconstruction  are also affecting patient's functional outcome.   REHAB POTENTIAL: Good  CLINICAL DECISION MAKING: Evolving/moderate complexity  EVALUATION COMPLEXITY: Moderate   GOALS: Goals reviewed with patient? No  SHORT TERM GOALS: Target date: 03/25/2024   Pt will be compliant and knowledgeable with initial HEP for improved comfort and carryover Baseline: initial HEP given  Goal status: MET Pt reports compliance  03/26/24  2.  Pt will self report bilateral knee pain no greater than 7/10 for improved comfort and functional ability Baseline: 10/10 at worst 04/09/2024: 6/10 Goal status: MET  LONG TERM GOALS: Target date: 07/16/2024    Pt will improve LEFS to no less than 50/80 as proxy for functional improvement with home ADLs and higher level community activity Baseline: 34/80 04/09/2024: 36/80 05/28/2024: 54/80 Goal status: MET  2.  Pt will self report bilateral knee pain no greater than 3/10 for improved comfort and functional ability Baseline: 10/10 at worst 04/09/2024: 6/10 04/28/2024: 5/10 Goal status: IN PROGRESS   3.  Pt will increase 30 Second Sit to Stand rep count to no less than 12 reps for improved balance, strength, and functional mobility Baseline: 8 reps 04/09/2024: 12 reps 05/26/2024: 14 reps Goal status: MET   4.  Pt will improve all LE MMT to no less than 4/5 for all tested motions for improved knee stabilization and decreased pain Baseline: see MMT chart Goal status: MET  5.  Pt will be able to squat with hands to floor without increase in bilateral knee pain for improved comfort and functional ability  Baseline: unable 04/28/2024: slight loss of balance, able to pick up small box Goal status: PARTIALLY MET    6.  Pt will be able to ambulate 2 miles without increase in knee pain or any instance of knee buckling for improve comfort and function Baseline: 1 miles Goal status: INITIAL  7.  Pt will improve bilateral SLS time to at least 20 seconds each for improved balance and ankle stability Baseline: 3 seconds each Goal status: INITIAL   PLAN:  PT FREQUENCY: 1-2x/week  PT DURATION: 6 weeks  PLANNED INTERVENTIONS: 97164- PT Re-evaluation, 97110-Therapeutic exercises, 97530- Therapeutic activity, [id number]- Neuromuscular re-education, 97535- Self Care, [id number]- Manual therapy, [id number]- Gait training, H9716- Electrical stimulation (unattended), [id number]- Electrical stimulation  (manual), 97016- Vasopneumatic device, Cryotherapy, and Moist heat  PLAN FOR NEXT SESSION: assess HEP response, quad and hip strengthening, progress functional lifting movements    Shanda Code 07/08/2024, 2:00 PM

## 2024-07-09 ENCOUNTER — Ambulatory Visit

## 2024-07-09 DIAGNOSIS — M25572 Pain in left ankle and joints of left foot: Secondary | ICD-10-CM

## 2024-07-09 DIAGNOSIS — R2689 Other abnormalities of gait and mobility: Secondary | ICD-10-CM

## 2024-07-09 DIAGNOSIS — G8929 Other chronic pain: Secondary | ICD-10-CM

## 2024-07-09 DIAGNOSIS — M25571 Pain in right ankle and joints of right foot: Secondary | ICD-10-CM

## 2024-07-09 DIAGNOSIS — M6281 Muscle weakness (generalized): Secondary | ICD-10-CM

## 2024-07-09 NOTE — Therapy (Signed)
 OUTPATIENT PHYSICAL THERAPY TREATMENT      Patient Name: CUMA POLYAKOV MRN: [id number] DOB:07/18/1991, 33 y.o., female Today's Date: 07/09/2024  END OF SESSION:  PT End of Session - 07/09/24 1443     Visit Number 28    Number of Visits 35    Date for Recertification  07/16/24    Authorization Type [employer] Dual Complete    PT Start Time 1445    PT Stop Time 1525    PT Time Calculation (min) 40 min    Activity Tolerance Patient tolerated treatment well    Behavior During Therapy WFL for tasks assessed/performed          Past Medical History:  Diagnosis Date   Arthritis    Bipolar 1 disorder (HCC)    Bowel incontinence    Depression    Diabetes mellitus without complication (HCC)    Headache    migraines   Mental disability    Sleep apnea    Past Surgical History:  Procedure Laterality Date   ANTERIOR CRUCIATE LIGAMENT REPAIR Right 05/11/2022   Procedure: RIGHT KNEE ARTHROSCOPY; LATERAL MENISECTOMY; ANTERIOR CRUCIATE LIGAMENT (ACL) REPAIR; CHONDROPLASTY;  Surgeon: Shari Sieving, MD;  Location: [facility];  Service: Orthopedics;  Laterality: Right;   FOOT SURGERY     KNEE ARTHROSCOPY W/ ACL RECONSTRUCTION Left 10/12/2020   TOOTH EXTRACTION     Patient Active Problem List   Diagnosis Date Noted   Pain in left elbow 07/27/2020   Prediabetes 02/03/2019   Obstructive sleep apnea 04/23/2018   Other fatigue 11/26/2017   Shortness of breath on exertion 11/26/2017   Type 2 diabetes mellitus without complication, without long-term current use of insulin  (HCC) 11/26/2017   Other hyperlipidemia 11/26/2017   Vitamin D  deficiency 11/26/2017   Schizoaffective disorder, bipolar type (HCC) 09/14/2014   Intellectual disability 09/10/2014    PCP: Leigh Lung, MD  REFERRING PROVIDER:  02/19/2024 - Knees - Shari Sieving, MD 05/22/2024 - Verlena GLENWOOD Joya Asberry, DPM   REFERRING DIAG:  02/19/2024 - Hx of ACL Reconstruction  05/22/2024 -  M76.72 (ICD-10-CM) - Peroneal tendonitis,  left M76.71 (ICD-10-CM) - Peroneal tendonitis, right    THERAPY DIAG:  Pain in left ankle and joints of left foot  Muscle weakness (generalized)  Pain in right ankle and joints of right foot  Chronic pain of left knee  Chronic pain of right knee  Other abnormalities of gait and mobility  Rationale for Evaluation and Treatment: Rehabilitation  ONSET DATE: Chronic  SUBJECTIVE:   SUBJECTIVE STATEMENT: Patient reports no soreness from yesterday's session, no pain today.  EVAL: Pt presents to PT with reports of chronic bilateral knee pain in presence of previous ACL repairs. She is well known to therapist and has been seen for both ACL recoveries in past. She currently has significant pain when standing for long periods of time, or when walking/squatting. Denies referral of symptoms past knee, pain is sharp and along bilateral knee joint lines.   PERTINENT HISTORY: Bipolar disorder, Depression, intellectual disability, bilateral ACL reconstruction   PAIN:  Are you having pain?  Yes: NPRS scale: currently 0/10, not at the moment Worst: 10/10 Pain location: bilateral inferior knee Pain description: sharp, sore Aggravating factors: standing, stairs,  Relieving factors: rest  PRECAUTIONS: None  RED FLAGS: None   WEIGHT BEARING RESTRICTIONS: No  FALLS:  Has patient fallen in last 6 months? -   LIVING ENVIRONMENT: Lives with: lives with their family Lives in: House/apartment Stairs: 4 STE - handrail  Has following equipment at home: None  OCCUPATION: Disability  PLOF: Independent with basic ADLs  PATIENT GOALS: decrease knee pain, improve strength and be able to work out  NEXT MD VISIT: PRN  OBJECTIVE:  Note: Objective measures were completed at Evaluation unless otherwise noted.  DIAGNOSTIC FINDINGS: N/A  PATIENT SURVEYS:  LEFS: 34/80 05/28/2024: 54/80  COGNITION: Overall cognitive status: Within functional limits for tasks  assessed     SENSATION: WFL  POSTURE: rounded shoulders, forward head, increased lumbar lordosis, and large body habitus  PALPATION: TTP to distal quad  LOWER EXTREMITY ROM:  Active ROM Right eval Left eval  Hip flexion    Hip extension    Hip abduction    Hip adduction    Hip internal rotation    Hip external rotation    Knee flexion 114 118  Knee extension 0 5  Ankle dorsiflexion    Ankle plantarflexion    Ankle inversion    Ankle eversion     (Blank rows = not tested)  LOWER EXTREMITY MMT:  MMT Right eval Left eval Right 04/28/24 Left 04/28/24  Hip flexion 3+ 3+ 4 4  Hip extension      Hip abduction 3+ 3+ 4 4  Hip adduction      Hip internal rotation      Hip external rotation      Knee flexion 3+ 3+ 4 4  Knee extension 3+ 3+ 4 4  Ankle dorsiflexion      Ankle plantarflexion      Ankle inversion      Ankle eversion       (Blank rows = not tested)  LOWER EXTREMITY SPECIAL TESTS:  Knee special tests: Lachman Test: negative  FUNCTIONAL TESTS: 30 Second Sit to Stand: 8 reps 04/09/2024: 13 reps  06/04/24:  SLS: 3 sec each  GAIT: Distance walked: 54ft Assistive device utilized: None Level of assistance: Complete Independence Comments: decreased gait speed, antalgia L  TREATMENT: [facility] Adult PT Treatment:                                                DATE: 07/09/24 (Ankle braces donned for all exercises today) Therapeutic Exercise: Bike level 3 x 5 mins Seated hamstring curl 2x10 55# Seated knee extension 2x10 35# Recumbent leg press plate 4 [id number] GTB around knees Seated hamstring stretch 2x30 BIL Neuromuscular re-ed: Semi-Tandem stance on foam 2x30 each - occasional LOB, able to self correct SLS on airex 2x30 ea (UE support today) Therapeutic Activity: Standing hip abd/ext 2x10 37.5# STS 2x10  [facility] Adult PT Treatment:                                                DATE: 07/08/24 (Ankle braces donned for all exercises today) Therapeutic  Exercise: Treadmill 1.4 speed x 5 mins for functional activity tolerance Seated hamstring curl 2x10 35# Seated knee extension 2x10 20# Recumbent leg press plate 4 [id number] GTB around knees Neuromuscular re-ed: Semi-Tandem stance on foam 2x30 each - occasional LOB, able to self correct SLS 2x30 ea (UE support today) Therapeutic Activity: Standing hip abd/ext 2x10 37.5#  [facility] Adult PT Treatment:  DATE: 06/30/24 (Ankle braces doffed for all exercises today) Therapeutic Exercise: Treadmill 1.4 speed x 5 mins for functional activity tolerance Seated hamstring curl 2x10 35# x10 45# Slant board gastroc stretch 2x30 Ankle inv/eversion 2x10 GTB Neuromuscular re-ed: Semi-Tandem stance on foam 2x30 each - occasional LOB, able to self correct SLS 2x30 ea Therapeutic Activity: Functional squats 2x10 B UE Standing hip abd 2x10 37.5#   PATIENT EDUCATION:  Education details: eval findings, LEFS, HEP, POC Person educated: Patient Education method: Explanation, Demonstration, and Handouts Education comprehension: verbalized understanding and returned demonstration  HOME EXERCISE PROGRAM: Access Code: [email or web address] URL: [email or web address] Date: 06/25/2024 Prepared by: Alm Kingdom  Exercises - Supine Heel Slide  - 1 x daily - 7 x weekly - 2 sets - 10 reps - 5 sec hold - Supine Quadricep Sets  - 1 x daily - 7 x weekly - 2 sets - 10 reps - 5 sec hold - Active Straight Leg Raise with Quad Set  - 1 x daily - 7 x weekly - 3-4 sets - 5 reps - Sidelying Hip Abduction  - 1 x daily - 7 x weekly - 3-4 sets - 5 reps - Tandem Stance  - 1 x daily - 7 x weekly - 2-3 reps - 30 sec hold - Heel Toe Raises with Counter Support  - 1 x daily - 7 x weekly - 2 sets - 15 reps - Long Sitting Ankle Inversion with Anchored Resistance  - 1 x daily - 7 x weekly - 3 sets - 10 reps - green band hold - Seated Calf Raise With Small Ball at Heels  - 1 x daily - 7  x weekly - 3 sets - 15 reps  ASSESSMENT:  CLINICAL IMPRESSION: Patient presents to PT reporting no soreness from previous session, did not have to walk far today so pain is currently minimal. Session today continued to focus on LE and proximal hip strengthening as well as balance tasks. Patient was able to tolerate all prescribed exercises with no adverse effects. Patient continues to benefit from skilled PT services and should be progressed as able to improve functional independence.   EVAL: Patient is a 33 y.o. F who was seen today for physical therapy evaluation and treatment for bilateral knee pain and discomfort. Physical findings are consistent with injury/surgical hx along with bilateral LE weakness particularly in lateral hip and quad. Her LEFS score shows moderate disability in performance of home ADLs and community level activities. Pt would benefit from skilled PT services with specific focus on LE strengthening for improving movement and decreasing knee pain.   OBJECTIVE IMPAIRMENTS: Abnormal gait, decreased activity tolerance, decreased mobility, difficulty walking, decreased ROM, decreased strength, and pain  ACTIVITY LIMITATIONS: carrying, lifting, sitting, standing, squatting, stairs, transfers, and locomotion level  PARTICIPATION LIMITATIONS: meal prep, cleaning, driving, shopping, community activity, and yard work  PERSONAL FACTORS: Time since onset of injury/illness/exacerbation and 3+ comorbidities: Bipolar disorder, Depression, intellectual disability, bilateral ACL reconstruction  are also affecting patient's functional outcome.   REHAB POTENTIAL: Good  CLINICAL DECISION MAKING: Evolving/moderate complexity  EVALUATION COMPLEXITY: Moderate   GOALS: Goals reviewed with patient? No  SHORT TERM GOALS: Target date: 03/25/2024   Pt will be compliant and knowledgeable with initial HEP for improved comfort and carryover Baseline: initial HEP given  Goal status: MET Pt  reports compliance 03/26/24  2.  Pt will self report bilateral knee pain no greater than 7/10 for improved comfort and functional ability Baseline: 10/10 at worst 04/09/2024: 6/10  Goal status: MET  LONG TERM GOALS: Target date: 07/16/2024    Pt will improve LEFS to no less than 50/80 as proxy for functional improvement with home ADLs and higher level community activity Baseline: 34/80 04/09/2024: 36/80 05/28/2024: 54/80 Goal status: MET  2.  Pt will self report bilateral knee pain no greater than 3/10 for improved comfort and functional ability Baseline: 10/10 at worst 04/09/2024: 6/10 04/28/2024: 5/10 Goal status: IN PROGRESS   3.  Pt will increase 30 Second Sit to Stand rep count to no less than 12 reps for improved balance, strength, and functional mobility Baseline: 8 reps 04/09/2024: 12 reps 05/26/2024: 14 reps Goal status: MET   4.  Pt will improve all LE MMT to no less than 4/5 for all tested motions for improved knee stabilization and decreased pain Baseline: see MMT chart Goal status: MET  5.  Pt will be able to squat with hands to floor without increase in bilateral knee pain for improved comfort and functional ability  Baseline: unable 04/28/2024: slight loss of balance, able to pick up small box Goal status: PARTIALLY MET    6.  Pt will be able to ambulate 2 miles without increase in knee pain or any instance of knee buckling for improve comfort and function Baseline: 1 miles Goal status: INITIAL  7.  Pt will improve bilateral SLS time to at least 20 seconds each for improved balance and ankle stability Baseline: 3 seconds each Goal status: INITIAL   PLAN:  PT FREQUENCY: 1-2x/week  PT DURATION: 6 weeks  PLANNED INTERVENTIONS: 97164- PT Re-evaluation, 97110-Therapeutic exercises, 97530- Therapeutic activity, [id number]- Neuromuscular re-education, 97535- Self Care, [id number]- Manual therapy, [id number]- Gait training, H9716- Electrical stimulation (unattended), [id number]- Electrical  stimulation (manual), 97016- Vasopneumatic device, Cryotherapy, and Moist heat  PLAN FOR NEXT SESSION: assess HEP response, quad and hip strengthening, progress functional lifting movements    Corean Pouch, PTA 07/09/2024, 3:21 PM

## 2024-07-14 ENCOUNTER — Ambulatory Visit

## 2024-07-14 DIAGNOSIS — M25572 Pain in left ankle and joints of left foot: Secondary | ICD-10-CM | POA: Diagnosis not present

## 2024-07-14 DIAGNOSIS — M25571 Pain in right ankle and joints of right foot: Secondary | ICD-10-CM

## 2024-07-14 DIAGNOSIS — R2689 Other abnormalities of gait and mobility: Secondary | ICD-10-CM

## 2024-07-14 DIAGNOSIS — M6281 Muscle weakness (generalized): Secondary | ICD-10-CM

## 2024-07-14 DIAGNOSIS — G8929 Other chronic pain: Secondary | ICD-10-CM

## 2024-07-14 NOTE — Therapy (Signed)
 OUTPATIENT PHYSICAL THERAPY TREATMENT      Patient Name: Cindy Phillips MRN: [id number] DOB:03-26-1991, 33 y.o., female Today's Date: 07/14/2024  END OF SESSION:  PT End of Session - 07/14/24 1332     Visit Number 29    Number of Visits 35    Date for Recertification  07/16/24    Authorization Type [employer] Dual Complete    PT Start Time 1400    PT Stop Time 1440    PT Time Calculation (min) 40 min    Activity Tolerance Patient tolerated treatment well    Behavior During Therapy WFL for tasks assessed/performed          Past Medical History:  Diagnosis Date   Arthritis    Bipolar 1 disorder (HCC)    Bowel incontinence    Depression    Diabetes mellitus without complication (HCC)    Headache    migraines   Mental disability    Sleep apnea    Past Surgical History:  Procedure Laterality Date   ANTERIOR CRUCIATE LIGAMENT REPAIR Right 05/11/2022   Procedure: RIGHT KNEE ARTHROSCOPY; LATERAL MENISECTOMY; ANTERIOR CRUCIATE LIGAMENT (ACL) REPAIR; CHONDROPLASTY;  Surgeon: Shari Sieving, MD;  Location: [facility];  Service: Orthopedics;  Laterality: Right;   FOOT SURGERY     KNEE ARTHROSCOPY W/ ACL RECONSTRUCTION Left 10/12/2020   TOOTH EXTRACTION     Patient Active Problem List   Diagnosis Date Noted   Pain in left elbow 07/27/2020   Prediabetes 02/03/2019   Obstructive sleep apnea 04/23/2018   Other fatigue 11/26/2017   Shortness of breath on exertion 11/26/2017   Type 2 diabetes mellitus without complication, without long-term current use of insulin  (HCC) 11/26/2017   Other hyperlipidemia 11/26/2017   Vitamin D  deficiency 11/26/2017   Schizoaffective disorder, bipolar type (HCC) 09/14/2014   Intellectual disability 09/10/2014    PCP: Leigh Lung, MD  REFERRING PROVIDER:  02/19/2024 - Knees - Shari Sieving, MD 05/22/2024 - Verlena GLENWOOD Joya Asberry, DPM   REFERRING DIAG:  02/19/2024 - Hx of ACL Reconstruction  05/22/2024 -  M76.72 (ICD-10-CM) - Peroneal tendonitis,  left M76.71 (ICD-10-CM) - Peroneal tendonitis, right   THERAPY DIAG:  Pain in left ankle and joints of left foot  Muscle weakness (generalized)  Pain in right ankle and joints of right foot  Chronic pain of left knee  Other abnormalities of gait and mobility  Chronic pain of right knee  Rationale for Evaluation and Treatment: Rehabilitation  ONSET DATE: Chronic  SUBJECTIVE:   SUBJECTIVE STATEMENT: Patient reports that she was having left ankle pain last night. She is also having some left knee pain.  EVAL: Pt presents to PT with reports of chronic bilateral knee pain in presence of previous ACL repairs. She is well known to therapist and has been seen for both ACL recoveries in past. She currently has significant pain when standing for long periods of time, or when walking/squatting. Denies referral of symptoms past knee, pain is sharp and along bilateral knee joint lines.   PERTINENT HISTORY: Bipolar disorder, Depression, intellectual disability, bilateral ACL reconstruction   PAIN:  Are you having pain?  Yes: NPRS scale: currently 0/10, not at the moment Worst: 10/10 Pain location: bilateral inferior knee Pain description: sharp, sore Aggravating factors: standing, stairs,  Relieving factors: rest  PRECAUTIONS: None  RED FLAGS: None   WEIGHT BEARING RESTRICTIONS: No  FALLS:  Has patient fallen in last 6 months? -   LIVING ENVIRONMENT: Lives with: lives with their family Lives  in: House/apartment Stairs: 4 STE - handrail  Has following equipment at home: None  OCCUPATION: Disability  PLOF: Independent with basic ADLs  PATIENT GOALS: decrease knee pain, improve strength and be able to work out  NEXT MD VISIT: PRN  OBJECTIVE:  Note: Objective measures were completed at Evaluation unless otherwise noted.  DIAGNOSTIC FINDINGS: N/A  PATIENT SURVEYS:  LEFS: 34/80 05/28/2024: 54/80  COGNITION: Overall cognitive status: Within functional limits for  tasks assessed     SENSATION: WFL  POSTURE: rounded shoulders, forward head, increased lumbar lordosis, and large body habitus  PALPATION: TTP to distal quad  LOWER EXTREMITY ROM:  Active ROM Right eval Left eval  Hip flexion    Hip extension    Hip abduction    Hip adduction    Hip internal rotation    Hip external rotation    Knee flexion 114 118  Knee extension 0 5  Ankle dorsiflexion    Ankle plantarflexion    Ankle inversion    Ankle eversion     (Blank rows = not tested)  LOWER EXTREMITY MMT:  MMT Right eval Left eval Right 04/28/24 Left 04/28/24  Hip flexion 3+ 3+ 4 4  Hip extension      Hip abduction 3+ 3+ 4 4  Hip adduction      Hip internal rotation      Hip external rotation      Knee flexion 3+ 3+ 4 4  Knee extension 3+ 3+ 4 4  Ankle dorsiflexion      Ankle plantarflexion      Ankle inversion      Ankle eversion       (Blank rows = not tested)  LOWER EXTREMITY SPECIAL TESTS:  Knee special tests: Lachman Test: negative  FUNCTIONAL TESTS: 30 Second Sit to Stand: 8 reps 04/09/2024: 13 reps  06/04/24:  SLS: 3 sec each  GAIT: Distance walked: 68ft Assistive device utilized: None Level of assistance: Complete Independence Comments: decreased gait speed, antalgia L  TREATMENT: [facility] Adult PT Treatment:                                                DATE: 07/14/24 (Ankle braces donned for all exercises today) Therapeutic Exercise: Bike level 3 x 5 mins Seated hamstring curl 2x10 55# Seated knee extension 2x10 35# Neuromuscular re-ed: Semi-Tandem stance on foam 2x30 each - occasional LOB, able to self correct SLS on airex 2x30 ea (UE support today) Rockerboard AP 2x30 (UE support PRN) Therapeutic Activity: Standing hip abd/ext 2x10 37.5# Sled push/pull 50# x3 laps  [facility] Adult PT Treatment:                                                DATE: 07/09/24 (Ankle braces donned for all exercises today) Therapeutic Exercise: Bike level 3 x 5  mins Seated hamstring curl 2x10 55# Seated knee extension 2x10 35# Recumbent leg press plate 4 [id number] GTB around knees Seated hamstring stretch 2x30 BIL Neuromuscular re-ed: Semi-Tandem stance on foam 2x30 each - occasional LOB, able to self correct SLS on airex 2x30 ea (UE support today) Therapeutic Activity: Standing hip abd/ext 2x10 37.5# STS 2x10  [facility] Adult PT Treatment:  DATE: 07/08/24 (Ankle braces donned for all exercises today) Therapeutic Exercise: Treadmill 1.4 speed x 5 mins for functional activity tolerance Seated hamstring curl 2x10 35# Seated knee extension 2x10 20# Recumbent leg press plate 4 [id number] GTB around knees Neuromuscular re-ed: Semi-Tandem stance on foam 2x30 each - occasional LOB, able to self correct SLS 2x30 ea (UE support today) Therapeutic Activity: Standing hip abd/ext 2x10 37.5#   PATIENT EDUCATION:  Education details: eval findings, LEFS, HEP, POC Person educated: Patient Education method: Explanation, Demonstration, and Handouts Education comprehension: verbalized understanding and returned demonstration  HOME EXERCISE PROGRAM: Access Code: [email or web address] URL: [email or web address] Date: 06/25/2024 Prepared by: Alm Kingdom  Exercises - Supine Heel Slide  - 1 x daily - 7 x weekly - 2 sets - 10 reps - 5 sec hold - Supine Quadricep Sets  - 1 x daily - 7 x weekly - 2 sets - 10 reps - 5 sec hold - Active Straight Leg Raise with Quad Set  - 1 x daily - 7 x weekly - 3-4 sets - 5 reps - Sidelying Hip Abduction  - 1 x daily - 7 x weekly - 3-4 sets - 5 reps - Tandem Stance  - 1 x daily - 7 x weekly - 2-3 reps - 30 sec hold - Heel Toe Raises with Counter Support  - 1 x daily - 7 x weekly - 2 sets - 15 reps - Long Sitting Ankle Inversion with Anchored Resistance  - 1 x daily - 7 x weekly - 3 sets - 10 reps - green band hold - Seated Calf Raise With Small Ball at Heels  - 1 x daily - 7 x weekly  - 3 sets - 15 reps  ASSESSMENT:  CLINICAL IMPRESSION: Patient presents to PT reporting some increased Lt ankle pain last night, and some current Lt knee pain. Session today continued to focus on LE strengthening and balance tasks to improve ankle stability and strength. Patient was able to tolerate all prescribed exercises with no adverse effects. Patient continues to benefit from skilled PT services and should be progressed as able to improve functional independence.   EVAL: Patient is a 33 y.o. F who was seen today for physical therapy evaluation and treatment for bilateral knee pain and discomfort. Physical findings are consistent with injury/surgical hx along with bilateral LE weakness particularly in lateral hip and quad. Her LEFS score shows moderate disability in performance of home ADLs and community level activities. Pt would benefit from skilled PT services with specific focus on LE strengthening for improving movement and decreasing knee pain.   OBJECTIVE IMPAIRMENTS: Abnormal gait, decreased activity tolerance, decreased mobility, difficulty walking, decreased ROM, decreased strength, and pain  ACTIVITY LIMITATIONS: carrying, lifting, sitting, standing, squatting, stairs, transfers, and locomotion level  PARTICIPATION LIMITATIONS: meal prep, cleaning, driving, shopping, community activity, and yard work  PERSONAL FACTORS: Time since onset of injury/illness/exacerbation and 3+ comorbidities: Bipolar disorder, Depression, intellectual disability, bilateral ACL reconstruction  are also affecting patient's functional outcome.   REHAB POTENTIAL: Good  CLINICAL DECISION MAKING: Evolving/moderate complexity  EVALUATION COMPLEXITY: Moderate   GOALS: Goals reviewed with patient? No  SHORT TERM GOALS: Target date: 03/25/2024   Pt will be compliant and knowledgeable with initial HEP for improved comfort and carryover Baseline: initial HEP given  Goal status: MET Pt reports compliance  03/26/24  2.  Pt will self report bilateral knee pain no greater than 7/10 for improved comfort and functional ability Baseline: 10/10 at worst 04/09/2024: 6/10 Goal status:  MET  LONG TERM GOALS: Target date: 07/16/2024    Pt will improve LEFS to no less than 50/80 as proxy for functional improvement with home ADLs and higher level community activity Baseline: 34/80 04/09/2024: 36/80 05/28/2024: 54/80 Goal status: MET  2.  Pt will self report bilateral knee pain no greater than 3/10 for improved comfort and functional ability Baseline: 10/10 at worst 04/09/2024: 6/10 04/28/2024: 5/10 Goal status: IN PROGRESS   3.  Pt will increase 30 Second Sit to Stand rep count to no less than 12 reps for improved balance, strength, and functional mobility Baseline: 8 reps 04/09/2024: 12 reps 05/26/2024: 14 reps Goal status: MET   4.  Pt will improve all LE MMT to no less than 4/5 for all tested motions for improved knee stabilization and decreased pain Baseline: see MMT chart Goal status: MET  5.  Pt will be able to squat with hands to floor without increase in bilateral knee pain for improved comfort and functional ability  Baseline: unable 04/28/2024: slight loss of balance, able to pick up small box Goal status: PARTIALLY MET    6.  Pt will be able to ambulate 2 miles without increase in knee pain or any instance of knee buckling for improve comfort and function Baseline: 1 miles Goal status: INITIAL  7.  Pt will improve bilateral SLS time to at least 20 seconds each for improved balance and ankle stability Baseline: 3 seconds each Goal status: INITIAL   PLAN:  PT FREQUENCY: 1-2x/week  PT DURATION: 6 weeks  PLANNED INTERVENTIONS: 97164- PT Re-evaluation, 97110-Therapeutic exercises, 97530- Therapeutic activity, [id number]- Neuromuscular re-education, 97535- Self Care, 02859- Manual therapy, [id number]- Gait training, H9716- Electrical stimulation (unattended), [id number]- Electrical stimulation  (manual), 97016- Vasopneumatic device, Cryotherapy, and Moist heat  PLAN FOR NEXT SESSION: assess HEP response, quad and hip strengthening, progress functional lifting movements    Corean Pouch, PTA 07/14/2024, 2:41 PM

## 2024-07-15 ENCOUNTER — Ambulatory Visit

## 2024-07-15 ENCOUNTER — Ambulatory Visit (INDEPENDENT_AMBULATORY_CARE_PROVIDER_SITE_OTHER): Admitting: Podiatry

## 2024-07-15 DIAGNOSIS — G8929 Other chronic pain: Secondary | ICD-10-CM

## 2024-07-15 DIAGNOSIS — M7672 Peroneal tendinitis, left leg: Secondary | ICD-10-CM

## 2024-07-15 DIAGNOSIS — M6281 Muscle weakness (generalized): Secondary | ICD-10-CM

## 2024-07-15 DIAGNOSIS — R2689 Other abnormalities of gait and mobility: Secondary | ICD-10-CM

## 2024-07-15 DIAGNOSIS — M7671 Peroneal tendinitis, right leg: Secondary | ICD-10-CM

## 2024-07-15 DIAGNOSIS — M25571 Pain in right ankle and joints of right foot: Secondary | ICD-10-CM

## 2024-07-15 DIAGNOSIS — M25572 Pain in left ankle and joints of left foot: Secondary | ICD-10-CM

## 2024-07-15 MED ORDER — METHYLPREDNISOLONE 4 MG PO TBPK: ORAL_TABLET | ORAL | 0 refills | Status: DC

## 2024-07-15 NOTE — Therapy (Signed)
 OUTPATIENT PHYSICAL THERAPY TREATMENT      Patient Name: EMBREE BRAWLEY MRN: [id number] DOB:July 28, 1991, 33 y.o., female Today's Date: 07/15/2024  END OF SESSION:  PT End of Session - 07/15/24 1531     Visit Number 30    Number of Visits 35    Date for Recertification  07/16/24    Authorization Type [employer] Dual Complete    PT Start Time 1530    PT Stop Time 1608    PT Time Calculation (min) 38 min    Activity Tolerance Patient tolerated treatment well    Behavior During Therapy WFL for tasks assessed/performed           Past Medical History:  Diagnosis Date   Arthritis    Bipolar 1 disorder (HCC)    Bowel incontinence    Depression    Diabetes mellitus without complication (HCC)    Headache    migraines   Mental disability    Sleep apnea    Past Surgical History:  Procedure Laterality Date   ANTERIOR CRUCIATE LIGAMENT REPAIR Right 05/11/2022   Procedure: RIGHT KNEE ARTHROSCOPY; LATERAL MENISECTOMY; ANTERIOR CRUCIATE LIGAMENT (ACL) REPAIR; CHONDROPLASTY;  Surgeon: Shari Sieving, MD;  Location: [facility];  Service: Orthopedics;  Laterality: Right;   FOOT SURGERY     KNEE ARTHROSCOPY W/ ACL RECONSTRUCTION Left 10/12/2020   TOOTH EXTRACTION     Patient Active Problem List   Diagnosis Date Noted   Pain in left elbow 07/27/2020   Prediabetes 02/03/2019   Obstructive sleep apnea 04/23/2018   Other fatigue 11/26/2017   Shortness of breath on exertion 11/26/2017   Type 2 diabetes mellitus without complication, without long-term current use of insulin  (HCC) 11/26/2017   Other hyperlipidemia 11/26/2017   Vitamin D  deficiency 11/26/2017   Schizoaffective disorder, bipolar type (HCC) 09/14/2014   Intellectual disability 09/10/2014    PCP: Leigh Lung, MD  REFERRING PROVIDER:  02/19/2024 - Knees - Shari Sieving, MD 05/22/2024 - Verlena GLENWOOD Joya Asberry, DPM   REFERRING DIAG:  02/19/2024 - Hx of ACL Reconstruction  05/22/2024 -  M76.72 (ICD-10-CM) - Peroneal  tendonitis, left M76.71 (ICD-10-CM) - Peroneal tendonitis, right   THERAPY DIAG:  Pain in left ankle and joints of left foot  Muscle weakness (generalized)  Pain in right ankle and joints of right foot  Chronic pain of left knee  Other abnormalities of gait and mobility  Chronic pain of right knee  Rationale for Evaluation and Treatment: Rehabilitation  ONSET DATE: Chronic  SUBJECTIVE:   SUBJECTIVE STATEMENT: Patient reports that she's having pain in her left ankle and knee today. She reports that she tripped and felt like she almost rolled her left ankle. She saw her MD today who started her on a steroid taper pack.   EVAL: Pt presents to PT with reports of chronic bilateral knee pain in presence of previous ACL repairs. She is well known to therapist and has been seen for both ACL recoveries in past. She currently has significant pain when standing for long periods of time, or when walking/squatting. Denies referral of symptoms past knee, pain is sharp and along bilateral knee joint lines.   PERTINENT HISTORY: Bipolar disorder, Depression, intellectual disability, bilateral ACL reconstruction   PAIN:  Are you having pain?  Yes: NPRS scale: currently 0/10, not at the moment Worst: 10/10 Pain location: bilateral inferior knee Pain description: sharp, sore Aggravating factors: standing, stairs,  Relieving factors: rest  PRECAUTIONS: None  RED FLAGS: None   WEIGHT BEARING RESTRICTIONS:  No  FALLS:  Has patient fallen in last 6 months? -   LIVING ENVIRONMENT: Lives with: lives with their family Lives in: House/apartment Stairs: 4 STE - handrail  Has following equipment at home: None  OCCUPATION: Disability  PLOF: Independent with basic ADLs  PATIENT GOALS: decrease knee pain, improve strength and be able to work out  NEXT MD VISIT: PRN  OBJECTIVE:  Note: Objective measures were completed at Evaluation unless otherwise noted.  DIAGNOSTIC FINDINGS:  N/A  PATIENT SURVEYS:  LEFS: 34/80 05/28/2024: 54/80  COGNITION: Overall cognitive status: Within functional limits for tasks assessed     SENSATION: WFL  POSTURE: rounded shoulders, forward head, increased lumbar lordosis, and large body habitus  PALPATION: TTP to distal quad  LOWER EXTREMITY ROM:  Active ROM Right eval Left eval  Hip flexion    Hip extension    Hip abduction    Hip adduction    Hip internal rotation    Hip external rotation    Knee flexion 114 118  Knee extension 0 5  Ankle dorsiflexion    Ankle plantarflexion    Ankle inversion    Ankle eversion     (Blank rows = not tested)  LOWER EXTREMITY MMT:  MMT Right eval Left eval Right 04/28/24 Left 04/28/24  Hip flexion 3+ 3+ 4 4  Hip extension      Hip abduction 3+ 3+ 4 4  Hip adduction      Hip internal rotation      Hip external rotation      Knee flexion 3+ 3+ 4 4  Knee extension 3+ 3+ 4 4  Ankle dorsiflexion      Ankle plantarflexion      Ankle inversion      Ankle eversion       (Blank rows = not tested)  LOWER EXTREMITY SPECIAL TESTS:  Knee special tests: Lachman Test: negative  FUNCTIONAL TESTS: 30 Second Sit to Stand: 8 reps 04/09/2024: 13 reps  06/04/24:  SLS: 3 sec each  GAIT: Distance walked: 51ft Assistive device utilized: None Level of assistance: Complete Independence Comments: decreased gait speed, antalgia L  TREATMENT: [facility] Adult PT Treatment:                                                DATE: 07/15/24 (Ankle braces donned for all exercises today) Therapeutic Exercise: Bike level 3 x 5 mins Seated hamstring curl 2x10 55# Seated knee extension 2x10 35# Slant board gastroc stretch 3x30 Slant board heel raise 2x10 Neuromuscular re-ed: Semi-Tandem stance on foam 2x30 each - occasional LOB, able to self correct SLS on airex 2x30 ea (UE support today) Therapeutic Activity: Standing hip abd/ext 2x10 37.5# Sled push/pull 50# x4 laps  [facility] Adult PT  Treatment:                                                DATE: 07/14/24 (Ankle braces donned for all exercises today) Therapeutic Exercise: Bike level 3 x 5 mins Seated hamstring curl 2x10 55# Seated knee extension 2x10 35# Neuromuscular re-ed: Semi-Tandem stance on foam 2x30 each - occasional LOB, able to self correct SLS on airex 2x30 ea (UE support today) Rockerboard AP 2x30 (UE support PRN)  Therapeutic Activity: Standing hip abd/ext 2x10 37.5# Sled push/pull 50# x3 laps  [facility] Adult PT Treatment:                                                DATE: 07/09/24 (Ankle braces donned for all exercises today) Therapeutic Exercise: Bike level 3 x 5 mins Seated hamstring curl 2x10 55# Seated knee extension 2x10 35# Recumbent leg press plate 4 [id number] GTB around knees Seated hamstring stretch 2x30 BIL Neuromuscular re-ed: Semi-Tandem stance on foam 2x30 each - occasional LOB, able to self correct SLS on airex 2x30 ea (UE support today) Therapeutic Activity: Standing hip abd/ext 2x10 37.5# STS 2x10   PATIENT EDUCATION:  Education details: eval findings, LEFS, HEP, POC Person educated: Patient Education method: Explanation, Demonstration, and Handouts Education comprehension: verbalized understanding and returned demonstration  HOME EXERCISE PROGRAM: Access Code: [email or web address] URL: [email or web address] Date: 06/25/2024 Prepared by: Alm Kingdom  Exercises - Supine Heel Slide  - 1 x daily - 7 x weekly - 2 sets - 10 reps - 5 sec hold - Supine Quadricep Sets  - 1 x daily - 7 x weekly - 2 sets - 10 reps - 5 sec hold - Active Straight Leg Raise with Quad Set  - 1 x daily - 7 x weekly - 3-4 sets - 5 reps - Sidelying Hip Abduction  - 1 x daily - 7 x weekly - 3-4 sets - 5 reps - Tandem Stance  - 1 x daily - 7 x weekly - 2-3 reps - 30 sec hold - Heel Toe Raises with Counter Support  - 1 x daily - 7 x weekly - 2 sets - 15 reps - Long Sitting Ankle Inversion with Anchored  Resistance  - 1 x daily - 7 x weekly - 3 sets - 10 reps - green band hold - Seated Calf Raise With Small Ball at Heels  - 1 x daily - 7 x weekly - 3 sets - 15 reps  ASSESSMENT:  CLINICAL IMPRESSION: Patient presents to PT reporting increased left ankle and knee pain and that she started a steroid taper pack today and feels more fatigued from this. Session today focused on LE strengthening and ankle stability. Patient was able to tolerate all prescribed exercises with no adverse effects. Patient continues to benefit from skilled PT services and should be progressed as able to improve functional independence.   EVAL: Patient is a 33 y.o. F who was seen today for physical therapy evaluation and treatment for bilateral knee pain and discomfort. Physical findings are consistent with injury/surgical hx along with bilateral LE weakness particularly in lateral hip and quad. Her LEFS score shows moderate disability in performance of home ADLs and community level activities. Pt would benefit from skilled PT services with specific focus on LE strengthening for improving movement and decreasing knee pain.   OBJECTIVE IMPAIRMENTS: Abnormal gait, decreased activity tolerance, decreased mobility, difficulty walking, decreased ROM, decreased strength, and pain  ACTIVITY LIMITATIONS: carrying, lifting, sitting, standing, squatting, stairs, transfers, and locomotion level  PARTICIPATION LIMITATIONS: meal prep, cleaning, driving, shopping, community activity, and yard work  PERSONAL FACTORS: Time since onset of injury/illness/exacerbation and 3+ comorbidities: Bipolar disorder, Depression, intellectual disability, bilateral ACL reconstruction  are also affecting patient's functional outcome.   REHAB POTENTIAL: Good  CLINICAL DECISION MAKING: Evolving/moderate complexity  EVALUATION COMPLEXITY: Moderate   GOALS:  Goals reviewed with patient? No  SHORT TERM GOALS: Target date: 03/25/2024   Pt will be compliant  and knowledgeable with initial HEP for improved comfort and carryover Baseline: initial HEP given  Goal status: MET Pt reports compliance 03/26/24  2.  Pt will self report bilateral knee pain no greater than 7/10 for improved comfort and functional ability Baseline: 10/10 at worst 04/09/2024: 6/10 Goal status: MET  LONG TERM GOALS: Target date: 07/16/2024    Pt will improve LEFS to no less than 50/80 as proxy for functional improvement with home ADLs and higher level community activity Baseline: 34/80 04/09/2024: 36/80 05/28/2024: 54/80 Goal status: MET  2.  Pt will self report bilateral knee pain no greater than 3/10 for improved comfort and functional ability Baseline: 10/10 at worst 04/09/2024: 6/10 04/28/2024: 5/10 Goal status: IN PROGRESS   3.  Pt will increase 30 Second Sit to Stand rep count to no less than 12 reps for improved balance, strength, and functional mobility Baseline: 8 reps 04/09/2024: 12 reps 05/26/2024: 14 reps Goal status: MET   4.  Pt will improve all LE MMT to no less than 4/5 for all tested motions for improved knee stabilization and decreased pain Baseline: see MMT chart Goal status: MET  5.  Pt will be able to squat with hands to floor without increase in bilateral knee pain for improved comfort and functional ability  Baseline: unable 04/28/2024: slight loss of balance, able to pick up small box Goal status: PARTIALLY MET    6.  Pt will be able to ambulate 2 miles without increase in knee pain or any instance of knee buckling for improve comfort and function Baseline: 1 miles Goal status: INITIAL  7.  Pt will improve bilateral SLS time to at least 20 seconds each for improved balance and ankle stability Baseline: 3 seconds each Goal status: INITIAL   PLAN:  PT FREQUENCY: 1-2x/week  PT DURATION: 6 weeks  PLANNED INTERVENTIONS: 97164- PT Re-evaluation, 97110-Therapeutic exercises, 97530- Therapeutic activity, [id number]- Neuromuscular re-education,  97535- Self Care, [id number]- Manual therapy, [id number]- Gait training, H9716- Electrical stimulation (unattended), [id number]- Electrical stimulation (manual), 97016- Vasopneumatic device, Cryotherapy, and Moist heat  PLAN FOR NEXT SESSION: assess HEP response, quad and hip strengthening, progress functional lifting movements    Corean Pouch, PTA 07/15/2024, 4:08 PM

## 2024-07-15 NOTE — Progress Notes (Signed)
  Subjective:  Patient ID: Cindy Phillips, female    DOB: June 06, 1991,   MRN: [id number]  Chief Complaint  Patient presents with   Foot Pain    Bilateral foot pain feels 25% improvement with braces and exercise.    33 y.o. female presents for follow-up of bilateral peroneal tendinitis and foot pain.  She relates she is only doing about 10 to 25% better.  She has been doing stretching exercises and wearing the ankle braces.  She does not believe the meloxicam  has been helping.  Denies any other pedal complaints. Denies n/v/f/c.   Past Medical History:  Diagnosis Date   Arthritis    Bipolar 1 disorder (HCC)    Bowel incontinence    Depression    Diabetes mellitus without complication (HCC)    Headache    migraines   Mental disability    Sleep apnea     Objective:  Physical Exam: Vascular: DP/PT pulses 2/4 bilateral. CFT <3 seconds. Normal hair growth on digits.   Skin. No lacerations or abrasions bilateral feet.  Musculoskeletal: MMT 5/5 bilateral lower extremities in DF, PF, Inversion and Eversion. Deceased ROM in DF of ankle joint. Tender to palpation overall about the foot but mostly around peroneal tendon. Pain with eversion and DF bilateral. Edema noted.  Neurological: Sensation intact to light touch.   Assessment:   1. Peroneal tendonitis, left   2. Peroneal tendonitis, right       Plan:  Patient was evaluated and treated and all questions answered. X-rays reviewed and discussed with patient. No acute fractures or dislocations noted.  Discussed peroneal tendinitis and treatment options at length with patient Can continue brace and stretching. Continue with physical therapy that she has been doing for her knee and working on her ankle as well. Discussed trying steroid pack as opposed to steroid shots today due to patient discomfort.  Medrol  Dosepak sent to pharmacy. Discussed that if the symptoms do not improve can consider PT/MRI. Patient to return in 6 to 8 weeks  or sooner if symptoms fail to improve or worsen.   Asberry Failing, DPM

## 2024-07-21 ENCOUNTER — Ambulatory Visit

## 2024-07-21 DIAGNOSIS — R2689 Other abnormalities of gait and mobility: Secondary | ICD-10-CM | POA: Diagnosis present

## 2024-07-21 DIAGNOSIS — M25562 Pain in left knee: Secondary | ICD-10-CM | POA: Insufficient documentation

## 2024-07-21 DIAGNOSIS — M25572 Pain in left ankle and joints of left foot: Secondary | ICD-10-CM | POA: Diagnosis present

## 2024-07-21 DIAGNOSIS — G8929 Other chronic pain: Secondary | ICD-10-CM | POA: Diagnosis present

## 2024-07-21 DIAGNOSIS — M25571 Pain in right ankle and joints of right foot: Secondary | ICD-10-CM | POA: Insufficient documentation

## 2024-07-21 DIAGNOSIS — M6281 Muscle weakness (generalized): Secondary | ICD-10-CM | POA: Insufficient documentation

## 2024-07-21 NOTE — Therapy (Incomplete)
 OUTPATIENT PHYSICAL THERAPY TREATMENT      Patient Name: Cindy Phillips MRN: [id number] DOB:December 09, 1990, 33 y.o., female Today's Date: 07/21/2024  END OF SESSION:     Past Medical History:  Diagnosis Date   Arthritis    Bipolar 1 disorder (HCC)    Bowel incontinence    Depression    Diabetes mellitus without complication (HCC)    Headache    migraines   Mental disability    Sleep apnea    Past Surgical History:  Procedure Laterality Date   ANTERIOR CRUCIATE LIGAMENT REPAIR Right 05/11/2022   Procedure: RIGHT KNEE ARTHROSCOPY; LATERAL MENISECTOMY; ANTERIOR CRUCIATE LIGAMENT (ACL) REPAIR; CHONDROPLASTY;  Surgeon: Shari Sieving, MD;  Location: [facility];  Service: Orthopedics;  Laterality: Right;   FOOT SURGERY     KNEE ARTHROSCOPY W/ ACL RECONSTRUCTION Left 10/12/2020   TOOTH EXTRACTION     Patient Active Problem List   Diagnosis Date Noted   Pain in left elbow 07/27/2020   Prediabetes 02/03/2019   Obstructive sleep apnea 04/23/2018   Other fatigue 11/26/2017   Shortness of breath on exertion 11/26/2017   Type 2 diabetes mellitus without complication, without long-term current use of insulin  (HCC) 11/26/2017   Other hyperlipidemia 11/26/2017   Vitamin D  deficiency 11/26/2017   Schizoaffective disorder, bipolar type (HCC) 09/14/2014   Intellectual disability 09/10/2014    PCP: Leigh Lung, MD  REFERRING PROVIDER:  02/19/2024 - Knees - Shari Sieving, MD 05/22/2024 - Verlena GLENWOOD Joya Asberry, DPM   REFERRING DIAG:  02/19/2024 - Hx of ACL Reconstruction  05/22/2024 -  M76.72 (ICD-10-CM) - Peroneal tendonitis, left M76.71 (ICD-10-CM) - Peroneal tendonitis, right   THERAPY DIAG:  No diagnosis found.  Rationale for Evaluation and Treatment: Rehabilitation  ONSET DATE: Chronic  SUBJECTIVE:   SUBJECTIVE STATEMENT: ***  EVAL: Pt presents to PT with reports of chronic bilateral knee pain in presence of previous ACL repairs. She is well known to therapist and  has been seen for both ACL recoveries in past. She currently has significant pain when standing for long periods of time, or when walking/squatting. Denies referral of symptoms past knee, pain is sharp and along bilateral knee joint lines.   PERTINENT HISTORY: Bipolar disorder, Depression, intellectual disability, bilateral ACL reconstruction   PAIN:  Are you having pain?  Yes: NPRS scale: currently 0/10, not at the moment Worst: 10/10 Pain location: bilateral inferior knee Pain description: sharp, sore Aggravating factors: standing, stairs,  Relieving factors: rest  PRECAUTIONS: None  RED FLAGS: None   WEIGHT BEARING RESTRICTIONS: No  FALLS:  Has patient fallen in last 6 months? -   LIVING ENVIRONMENT: Lives with: lives with their family Lives in: House/apartment Stairs: 4 STE - handrail  Has following equipment at home: None  OCCUPATION: Disability  PLOF: Independent with basic ADLs  PATIENT GOALS: decrease knee pain, improve strength and be able to work out  NEXT MD VISIT: PRN  OBJECTIVE:  Note: Objective measures were completed at Evaluation unless otherwise noted.  DIAGNOSTIC FINDINGS: N/A  PATIENT SURVEYS:  LEFS: 34/80 05/28/2024: 54/80  COGNITION: Overall cognitive status: Within functional limits for tasks assessed     SENSATION: WFL  POSTURE: rounded shoulders, forward head, increased lumbar lordosis, and large body habitus  PALPATION: TTP to distal quad  LOWER EXTREMITY ROM:  Active ROM Right eval Left eval  Hip flexion    Hip extension    Hip abduction    Hip adduction    Hip internal rotation    Hip  external rotation    Knee flexion 114 118  Knee extension 0 5  Ankle dorsiflexion    Ankle plantarflexion    Ankle inversion    Ankle eversion     (Blank rows = not tested)  LOWER EXTREMITY MMT:  MMT Right eval Left eval Right 04/28/24 Left 04/28/24  Hip flexion 3+ 3+ 4 4  Hip extension      Hip abduction 3+ 3+ 4 4  Hip  adduction      Hip internal rotation      Hip external rotation      Knee flexion 3+ 3+ 4 4  Knee extension 3+ 3+ 4 4  Ankle dorsiflexion      Ankle plantarflexion      Ankle inversion      Ankle eversion       (Blank rows = not tested)  LOWER EXTREMITY SPECIAL TESTS:  Knee special tests: Lachman Test: negative  FUNCTIONAL TESTS: 30 Second Sit to Stand: 8 reps 04/09/2024: 13 reps  06/04/24:  SLS: 3 sec each  GAIT: Distance walked: 72ft Assistive device utilized: None Level of assistance: Complete Independence Comments: decreased gait speed, antalgia L  TREATMENT: [facility] Adult PT Treatment:                                                DATE: 07/15/24 (Ankle braces donned for all exercises today) Therapeutic Exercise: Bike level 3 x 5 mins Seated hamstring curl 2x10 55# Seated knee extension 2x10 35# Slant board gastroc stretch 3x30 Slant board heel raise 2x10 Neuromuscular re-ed: Semi-Tandem stance on foam 2x30 each - occasional LOB, able to self correct SLS on airex 2x30 ea (UE support today) Therapeutic Activity: Standing hip abd/ext 2x10 37.5# Sled push/pull 50# x4 laps  [facility] Adult PT Treatment:                                                DATE: 07/14/24 (Ankle braces donned for all exercises today) Therapeutic Exercise: Bike level 3 x 5 mins Seated hamstring curl 2x10 55# Seated knee extension 2x10 35# Neuromuscular re-ed: Semi-Tandem stance on foam 2x30 each - occasional LOB, able to self correct SLS on airex 2x30 ea (UE support today) Rockerboard AP 2x30 (UE support PRN) Therapeutic Activity: Standing hip abd/ext 2x10 37.5# Sled push/pull 50# x3 laps  [facility] Adult PT Treatment:                                                DATE: 07/09/24 (Ankle braces donned for all exercises today) Therapeutic Exercise: Bike level 3 x 5 mins Seated hamstring curl 2x10 55# Seated knee extension 2x10 35# Recumbent leg press plate 4 [id number] GTB around knees Seated  hamstring stretch 2x30 BIL Neuromuscular re-ed: Semi-Tandem stance on foam 2x30 each - occasional LOB, able to self correct SLS on airex 2x30 ea (UE support today) Therapeutic Activity: Standing hip abd/ext 2x10 37.5# STS 2x10   PATIENT EDUCATION:  Education details: eval findings, LEFS, HEP, POC Person educated: Patient Education method: Explanation, Demonstration, and Handouts Education comprehension: verbalized understanding and returned demonstration  HOME EXERCISE PROGRAM: Access Code: [email or web address] URL: [email or web address] Date: 06/25/2024 Prepared by: Alm Kingdom  Exercises - Supine Heel Slide  - 1 x daily - 7 x weekly - 2 sets - 10 reps - 5 sec hold - Supine Quadricep Sets  - 1 x daily - 7 x weekly - 2 sets - 10 reps - 5 sec hold - Active Straight Leg Raise with Quad Set  - 1 x daily - 7 x weekly - 3-4 sets - 5 reps - Sidelying Hip Abduction  - 1 x daily - 7 x weekly - 3-4 sets - 5 reps - Tandem Stance  - 1 x daily - 7 x weekly - 2-3 reps - 30 sec hold - Heel Toe Raises with Counter Support  - 1 x daily - 7 x weekly - 2 sets - 15 reps - Long Sitting Ankle Inversion with Anchored Resistance  - 1 x daily - 7 x weekly - 3 sets - 10 reps - green band hold - Seated Calf Raise With Small Ball at Heels  - 1 x daily - 7 x weekly - 3 sets - 15 reps  ASSESSMENT:  CLINICAL IMPRESSION: ***  EVAL: Patient is a 33 y.o. F who was seen today for physical therapy evaluation and treatment for bilateral knee pain and discomfort. Physical findings are consistent with injury/surgical hx along with bilateral LE weakness particularly in lateral hip and quad. Her LEFS score shows moderate disability in performance of home ADLs and community level activities. Pt would benefit from skilled PT services with specific focus on LE strengthening for improving movement and decreasing knee pain.   OBJECTIVE IMPAIRMENTS: Abnormal gait, decreased activity tolerance, decreased mobility,  difficulty walking, decreased ROM, decreased strength, and pain  ACTIVITY LIMITATIONS: carrying, lifting, sitting, standing, squatting, stairs, transfers, and locomotion level  PARTICIPATION LIMITATIONS: meal prep, cleaning, driving, shopping, community activity, and yard work  PERSONAL FACTORS: Time since onset of injury/illness/exacerbation and 3+ comorbidities: Bipolar disorder, Depression, intellectual disability, bilateral ACL reconstruction  are also affecting patient's functional outcome.   REHAB POTENTIAL: Good  CLINICAL DECISION MAKING: Evolving/moderate complexity  EVALUATION COMPLEXITY: Moderate   GOALS: Goals reviewed with patient? No  SHORT TERM GOALS: Target date: 03/25/2024   Pt will be compliant and knowledgeable with initial HEP for improved comfort and carryover Baseline: initial HEP given  Goal status: MET Pt reports compliance 03/26/24  2.  Pt will self report bilateral knee pain no greater than 7/10 for improved comfort and functional ability Baseline: 10/10 at worst 04/09/2024: 6/10 Goal status: MET  LONG TERM GOALS: Target date: 07/16/2024    Pt will improve LEFS to no less than 50/80 as proxy for functional improvement with home ADLs and higher level community activity Baseline: 34/80 04/09/2024: 36/80 05/28/2024: 54/80 Goal status: MET  2.  Pt will self report bilateral knee pain no greater than 3/10 for improved comfort and functional ability Baseline: 10/10 at worst 04/09/2024: 6/10 04/28/2024: 5/10 Goal status: IN PROGRESS   3.  Pt will increase 30 Second Sit to Stand rep count to no less than 12 reps for improved balance, strength, and functional mobility Baseline: 8 reps 04/09/2024: 12 reps 05/26/2024: 14 reps Goal status: MET   4.  Pt will improve all LE MMT to no less than 4/5 for all tested motions for improved knee stabilization and decreased pain Baseline: see MMT chart Goal status: MET  5.  Pt will be able to squat with hands to floor  without increase in  bilateral knee pain for improved comfort and functional ability  Baseline: unable 04/28/2024: slight loss of balance, able to pick up small box Goal status: PARTIALLY MET    6.  Pt will be able to ambulate 2 miles without increase in knee pain or any instance of knee buckling for improve comfort and function Baseline: 1 miles Goal status: INITIAL  7.  Pt will improve bilateral SLS time to at least 20 seconds each for improved balance and ankle stability Baseline: 3 seconds each Goal status: INITIAL   PLAN:  PT FREQUENCY: 1-2x/week  PT DURATION: 6 weeks  PLANNED INTERVENTIONS: 97164- PT Re-evaluation, 97110-Therapeutic exercises, 97530- Therapeutic activity, [id number]- Neuromuscular re-education, 97535- Self Care, [id number]- Manual therapy, [id number]- Gait training, [id number]- Electrical stimulation (unattended), [id number]- Electrical stimulation (manual), 97016- Vasopneumatic device, Cryotherapy, and Moist heat  PLAN FOR NEXT SESSION: assess HEP response, quad and hip strengthening, progress functional lifting movements    Alm JAYSON Kingdom, PT 07/21/2024, 9:05 AM

## 2024-07-21 NOTE — Therapy (Unsigned)
 OUTPATIENT PHYSICAL THERAPY TREATMENT      Patient Name: Cindy Phillips MRN: [id number] DOB:1990-09-09, 33 y.o., female Today's Date: 07/22/2024  END OF SESSION:  PT End of Session - 07/21/24 1451     Visit Number 31    Number of Visits 35    Date for Recertification  08/19/24    Authorization Type [employer] Dual Complete    PT Start Time 1530    PT Stop Time 1610    PT Time Calculation (min) 40 min    Activity Tolerance Patient tolerated treatment well    Behavior During Therapy WFL for tasks assessed/performed            Past Medical History:  Diagnosis Date   Arthritis    Bipolar 1 disorder (HCC)    Bowel incontinence    Depression    Diabetes mellitus without complication (HCC)    Headache    migraines   Mental disability    Sleep apnea    Past Surgical History:  Procedure Laterality Date   ANTERIOR CRUCIATE LIGAMENT REPAIR Right 05/11/2022   Procedure: RIGHT KNEE ARTHROSCOPY; LATERAL MENISECTOMY; ANTERIOR CRUCIATE LIGAMENT (ACL) REPAIR; CHONDROPLASTY;  Surgeon: Shari Sieving, MD;  Location: [facility];  Service: Orthopedics;  Laterality: Right;   FOOT SURGERY     KNEE ARTHROSCOPY W/ ACL RECONSTRUCTION Left 10/12/2020   TOOTH EXTRACTION     Patient Active Problem List   Diagnosis Date Noted   Pain in left elbow 07/27/2020   Prediabetes 02/03/2019   Obstructive sleep apnea 04/23/2018   Other fatigue 11/26/2017   Shortness of breath on exertion 11/26/2017   Type 2 diabetes mellitus without complication, without long-term current use of insulin  (HCC) 11/26/2017   Other hyperlipidemia 11/26/2017   Vitamin D  deficiency 11/26/2017   Schizoaffective disorder, bipolar type (HCC) 09/14/2014   Intellectual disability 09/10/2014    PCP: Leigh Lung, MD  REFERRING PROVIDER:  02/19/2024 - Knees - Shari Sieving, MD 05/22/2024 - Verlena GLENWOOD Joya Asberry, DPM   REFERRING DIAG:  02/19/2024 - Hx of ACL Reconstruction  05/22/2024 -  M76.72 (ICD-10-CM) - Peroneal  tendonitis, left M76.71 (ICD-10-CM) - Peroneal tendonitis, right   THERAPY DIAG:  Pain in left ankle and joints of left foot - Plan: PT plan of care cert/re-cert  Muscle weakness (generalized) - Plan: PT plan of care cert/re-cert  Pain in right ankle and joints of right foot - Plan: PT plan of care cert/re-cert  Chronic pain of left knee - Plan: PT plan of care cert/re-cert  Other abnormalities of gait and mobility - Plan: PT plan of care cert/re-cert  Rationale for Evaluation and Treatment: Rehabilitation  ONSET DATE: Chronic  SUBJECTIVE:   SUBJECTIVE STATEMENT: Pt presents to PT with reports of L knee and L ankle pain, R LE is feeling better. She has had two near falls in the last few days.  EVAL: Pt presents to PT with reports of chronic bilateral knee pain in presence of previous ACL repairs. She is well known to therapist and has been seen for both ACL recoveries in past. She currently has significant pain when standing for long periods of time, or when walking/squatting. Denies referral of symptoms past knee, pain is sharp and along bilateral knee joint lines.   PERTINENT HISTORY: Bipolar disorder, Depression, intellectual disability, bilateral ACL reconstruction   PAIN:  Are you having pain?  Yes: NPRS scale: currently 0/10, not at the moment Worst: 10/10 Pain location: bilateral inferior knee Pain description: sharp, sore Aggravating factors:  standing, stairs,  Relieving factors: rest  PRECAUTIONS: None  RED FLAGS: None   WEIGHT BEARING RESTRICTIONS: No  FALLS:  Has patient fallen in last 6 months? -   LIVING ENVIRONMENT: Lives with: lives with their family Lives in: House/apartment Stairs: 4 STE - handrail  Has following equipment at home: None  OCCUPATION: Disability  PLOF: Independent with basic ADLs  PATIENT GOALS: decrease knee pain, improve strength and be able to work out  NEXT MD VISIT: PRN  OBJECTIVE:  Note: Objective measures were  completed at Evaluation unless otherwise noted.  DIAGNOSTIC FINDINGS: N/A  PATIENT SURVEYS:  LEFS: 34/80 05/28/2024: 54/80  COGNITION: Overall cognitive status: Within functional limits for tasks assessed     SENSATION: WFL  POSTURE: rounded shoulders, forward head, increased lumbar lordosis, and large body habitus  PALPATION: TTP to distal quad  LOWER EXTREMITY ROM:  Active ROM Right eval Left eval  Hip flexion    Hip extension    Hip abduction    Hip adduction    Hip internal rotation    Hip external rotation    Knee flexion 114 118  Knee extension 0 5  Ankle dorsiflexion    Ankle plantarflexion    Ankle inversion    Ankle eversion     (Blank rows = not tested)  LOWER EXTREMITY MMT:  MMT Right eval Left eval Right 04/28/24 Left 04/28/24  Hip flexion 3+ 3+ 4 4  Hip extension      Hip abduction 3+ 3+ 4 4  Hip adduction      Hip internal rotation      Hip external rotation      Knee flexion 3+ 3+ 4 4  Knee extension 3+ 3+ 4 4  Ankle dorsiflexion      Ankle plantarflexion      Ankle inversion      Ankle eversion       (Blank rows = not tested)  LOWER EXTREMITY SPECIAL TESTS:  Knee special tests: Lachman Test: negative  FUNCTIONAL TESTS: 30 Second Sit to Stand: 8 reps 04/09/2024: 13 reps  06/04/24:  SLS: 3 sec each  GAIT: Distance walked: 37ft Assistive device utilized: None Level of assistance: Complete Independence Comments: decreased gait speed, antalgia L  TREATMENT: [facility] Adult PT Treatment:                                                DATE: 07/21/24 (Ankle braces donned for all exercises today) Therapeutic Exercise: Bike level 3 x 5 mins Seated hamstring curl 3x10 55# Seated knee extension 3x10 35# Slant board gastroc stretch 3x30 Neuromuscular re-ed: Semi-Tandem stance on foam 2x30 each - occasional LOB, able to self correct Side stepping on foam beam x 3 laps  Therapeutic Activity: Standing hip abd/ext 2x10 37.5# Sled push/pull  50# x4 laps  [facility] Adult PT Treatment:                                                DATE: 07/15/24 (Ankle braces donned for all exercises today) Therapeutic Exercise: Bike level 3 x 5 mins Seated hamstring curl 2x10 55# Seated knee extension 2x10 35# Slant board gastroc stretch 3x30 Slant board heel raise 2x10 Neuromuscular re-ed: Semi-Tandem stance on  foam 2x30 each - occasional LOB, able to self correct SLS on airex 2x30 ea (UE support today) Therapeutic Activity: Standing hip abd/ext 2x10 37.5# Sled push/pull 50# x4 laps  [facility] Adult PT Treatment:                                                DATE: 07/14/24 (Ankle braces donned for all exercises today) Therapeutic Exercise: Bike level 3 x 5 mins Seated hamstring curl 2x10 55# Seated knee extension 2x10 35# Neuromuscular re-ed: Semi-Tandem stance on foam 2x30 each - occasional LOB, able to self correct SLS on airex 2x30 ea (UE support today) Rockerboard AP 2x30 (UE support PRN) Therapeutic Activity: Standing hip abd/ext 2x10 37.5# Sled push/pull 50# x3 laps  [facility] Adult PT Treatment:                                                DATE: 07/09/24 (Ankle braces donned for all exercises today) Therapeutic Exercise: Bike level 3 x 5 mins Seated hamstring curl 2x10 55# Seated knee extension 2x10 35# Recumbent leg press plate 4 [id number] GTB around knees Seated hamstring stretch 2x30 BIL Neuromuscular re-ed: Semi-Tandem stance on foam 2x30 each - occasional LOB, able to self correct SLS on airex 2x30 ea (UE support today) Therapeutic Activity: Standing hip abd/ext 2x10 37.5# STS 2x10   PATIENT EDUCATION:  Education details: eval findings, LEFS, HEP, POC Person educated: Patient Education method: Explanation, Demonstration, and Handouts Education comprehension: verbalized understanding and returned demonstration  HOME EXERCISE PROGRAM: Access Code: [email or web address] URL: [email or web address] Date:  06/25/2024 Prepared by: Alm Kingdom  Exercises - Supine Heel Slide  - 1 x daily - 7 x weekly - 2 sets - 10 reps - 5 sec hold - Supine Quadricep Sets  - 1 x daily - 7 x weekly - 2 sets - 10 reps - 5 sec hold - Active Straight Leg Raise with Quad Set  - 1 x daily - 7 x weekly - 3-4 sets - 5 reps - Sidelying Hip Abduction  - 1 x daily - 7 x weekly - 3-4 sets - 5 reps - Tandem Stance  - 1 x daily - 7 x weekly - 2-3 reps - 30 sec hold - Heel Toe Raises with Counter Support  - 1 x daily - 7 x weekly - 2 sets - 15 reps - Long Sitting Ankle Inversion with Anchored Resistance  - 1 x daily - 7 x weekly - 3 sets - 10 reps - green band hold - Seated Calf Raise With Small Ball at Heels  - 1 x daily - 7 x weekly - 3 sets - 15 reps  ASSESSMENT:  CLINICAL IMPRESSION: Pt was able to complete all prescribed exercises with no adverse effect. Today we continued to progress LE strength and ankle stability. She continues to benefit from skilled PT services, will continue per POC.   EVAL: Patient is a 33 y.o. F who was seen today for physical therapy evaluation and treatment for bilateral knee pain and discomfort. Physical findings are consistent with injury/surgical hx along with bilateral LE weakness particularly in lateral hip and quad. Her LEFS score shows moderate disability in performance of home ADLs and community level activities. Pt would  benefit from skilled PT services with specific focus on LE strengthening for improving movement and decreasing knee pain.   OBJECTIVE IMPAIRMENTS: Abnormal gait, decreased activity tolerance, decreased mobility, difficulty walking, decreased ROM, decreased strength, and pain  ACTIVITY LIMITATIONS: carrying, lifting, sitting, standing, squatting, stairs, transfers, and locomotion level  PARTICIPATION LIMITATIONS: meal prep, cleaning, driving, shopping, community activity, and yard work  PERSONAL FACTORS: Time since onset of injury/illness/exacerbation and 3+  comorbidities: Bipolar disorder, Depression, intellectual disability, bilateral ACL reconstruction  are also affecting patient's functional outcome.   REHAB POTENTIAL: Good  CLINICAL DECISION MAKING: Evolving/moderate complexity  EVALUATION COMPLEXITY: Moderate   GOALS: Goals reviewed with patient? No  SHORT TERM GOALS: Target date: 03/25/2024   Pt will be compliant and knowledgeable with initial HEP for improved comfort and carryover Baseline: initial HEP given  Goal status: MET Pt reports compliance 03/26/24  2.  Pt will self report bilateral knee pain no greater than 7/10 for improved comfort and functional ability Baseline: 10/10 at worst 04/09/2024: 6/10 Goal status: MET  LONG TERM GOALS: Target date: 08/19/2024   Pt will improve LEFS to no less than 50/80 as proxy for functional improvement with home ADLs and higher level community activity Baseline: 34/80 04/09/2024: 36/80 05/28/2024: 54/80 Goal status: MET  2.  Pt will self report bilateral knee pain no greater than 3/10 for improved comfort and functional ability Baseline: 10/10 at worst 04/09/2024: 6/10 04/28/2024: 5/10 Goal status: IN PROGRESS   3.  Pt will increase 30 Second Sit to Stand rep count to no less than 12 reps for improved balance, strength, and functional mobility Baseline: 8 reps 04/09/2024: 12 reps 05/26/2024: 14 reps Goal status: MET   4.  Pt will improve all LE MMT to no less than 4/5 for all tested motions for improved knee stabilization and decreased pain Baseline: see MMT chart Goal status: MET  5.  Pt will be able to squat with hands to floor without increase in bilateral knee pain for improved comfort and functional ability  Baseline: unable 04/28/2024: slight loss of balance, able to pick up small box Goal status: PARTIALLY MET    6.  Pt will be able to ambulate 2 miles without increase in knee pain or any instance of knee buckling for improve comfort and function Baseline: 1 miles Goal  status: INITIAL  7.  Pt will improve bilateral SLS time to at least 20 seconds each for improved balance and ankle stability Baseline: 3 seconds each Goal status: INITIAL   PLAN:  PT FREQUENCY: 1-2x/week  PT DURATION: 6 weeks  PLANNED INTERVENTIONS: 97164- PT Re-evaluation, 97110-Therapeutic exercises, 97530- Therapeutic activity, [id number]- Neuromuscular re-education, 97535- Self Care, [id number]- Manual therapy, [id number]- Gait training, [id number]- Electrical stimulation (unattended), [id number]- Electrical stimulation (manual), 97016- Vasopneumatic device, Cryotherapy, and Moist heat  PLAN FOR NEXT SESSION: assess HEP response, quad and hip strengthening, progress functional lifting movements    Alm JAYSON Kingdom, PT 07/22/2024, 11:56 AM

## 2024-07-23 ENCOUNTER — Ambulatory Visit: Admitting: Physical Therapy

## 2024-07-23 ENCOUNTER — Encounter: Payer: Self-pay | Admitting: Physical Therapy

## 2024-07-23 DIAGNOSIS — M25572 Pain in left ankle and joints of left foot: Secondary | ICD-10-CM | POA: Diagnosis not present

## 2024-07-23 DIAGNOSIS — M6281 Muscle weakness (generalized): Secondary | ICD-10-CM

## 2024-07-23 DIAGNOSIS — M25571 Pain in right ankle and joints of right foot: Secondary | ICD-10-CM

## 2024-07-23 DIAGNOSIS — G8929 Other chronic pain: Secondary | ICD-10-CM

## 2024-07-23 NOTE — Therapy (Signed)
 OUTPATIENT PHYSICAL THERAPY TREATMENT      Patient Name: Cindy Phillips MRN: [id number] DOB:[birth year], 33 y.o., female Today's Date: 07/23/2024  END OF SESSION:  PT End of Session - 07/23/24 1146     Visit Number 32    Number of Visits 35    Date for Recertification  08/19/24    Authorization Type [employer] Dual Complete    PT Start Time 1145    PT Stop Time 1226    PT Time Calculation (min) 41 min    Activity Tolerance Patient tolerated treatment well    Behavior During Therapy WFL for tasks assessed/performed            Past Medical History:  Diagnosis Date   Arthritis    Bipolar 1 disorder (HCC)    Bowel incontinence    Depression    Diabetes mellitus without complication (HCC)    Headache    migraines   Mental disability    Sleep apnea    Past Surgical History:  Procedure Laterality Date   ANTERIOR CRUCIATE LIGAMENT REPAIR Right 05/11/2022   Procedure: RIGHT KNEE ARTHROSCOPY; LATERAL MENISECTOMY; ANTERIOR CRUCIATE LIGAMENT (ACL) REPAIR; CHONDROPLASTY;  Surgeon: Shari Sieving, MD;  Location: [facility];  Service: Orthopedics;  Laterality: Right;   FOOT SURGERY     KNEE ARTHROSCOPY W/ ACL RECONSTRUCTION Left 10/12/2020   TOOTH EXTRACTION     Patient Active Problem List   Diagnosis Date Noted   Pain in left elbow 07/27/2020   Prediabetes 02/03/2019   Obstructive sleep apnea 04/23/2018   Other fatigue 11/26/2017   Shortness of breath on exertion 11/26/2017   Type 2 diabetes mellitus without complication, without long-term current use of insulin  (HCC) 11/26/2017   Other hyperlipidemia 11/26/2017   Vitamin D  deficiency 11/26/2017   Schizoaffective disorder, bipolar type (HCC) 09/14/2014   Intellectual disability 09/10/2014    PCP: Leigh Lung, MD  REFERRING PROVIDER:  02/19/2024 - Knees - Shari Sieving, MD 05/22/2024 - Verlena GLENWOOD Joya Asberry, DPM   REFERRING DIAG:  02/19/2024 - Hx of ACL Reconstruction  05/22/2024 -  M76.72 (ICD-10-CM) - Peroneal  tendonitis, left M76.71 (ICD-10-CM) - Peroneal tendonitis, right   THERAPY DIAG:  Pain in left ankle and joints of left foot  Muscle weakness (generalized)  Pain in right ankle and joints of right foot  Chronic pain of left knee  Rationale for Evaluation and Treatment: Rehabilitation  ONSET DATE: Chronic  SUBJECTIVE:   SUBJECTIVE STATEMENT: Pt reports continued pain in the L ankle and knee.  Some improvement with PT.  EVAL: Pt presents to PT with reports of chronic bilateral knee pain in presence of previous ACL repairs. She is well known to therapist and has been seen for both ACL recoveries in past. She currently has significant pain when standing for long periods of time, or when walking/squatting. Denies referral of symptoms past knee, pain is sharp and along bilateral knee joint lines.   PERTINENT HISTORY: Bipolar disorder, Depression, intellectual disability, bilateral ACL reconstruction   PAIN:  Are you having pain?  Yes: NPRS scale: currently 0/10, not at the moment Worst: 10/10 Pain location: bilateral inferior knee Pain description: sharp, sore Aggravating factors: standing, stairs,  Relieving factors: rest  PRECAUTIONS: None  RED FLAGS: None   WEIGHT BEARING RESTRICTIONS: No  FALLS:  Has patient fallen in last 6 months? -   LIVING ENVIRONMENT: Lives with: lives with their family Lives in: House/apartment Stairs: 4 STE - handrail  Has following equipment at home: None  OCCUPATION: Disability  PLOF: Independent with basic ADLs  PATIENT GOALS: decrease knee pain, improve strength and be able to work out  NEXT MD VISIT: PRN  OBJECTIVE:  Note: Objective measures were completed at Evaluation unless otherwise noted.  DIAGNOSTIC FINDINGS: N/A  PATIENT SURVEYS:  LEFS: 34/80 05/28/2024: 54/80  COGNITION: Overall cognitive status: Within functional limits for tasks assessed     SENSATION: WFL  POSTURE: rounded shoulders, forward head,  increased lumbar lordosis, and large body habitus  PALPATION: TTP to distal quad  LOWER EXTREMITY ROM:  Active ROM Right eval Left eval  Hip flexion    Hip extension    Hip abduction    Hip adduction    Hip internal rotation    Hip external rotation    Knee flexion 114 118  Knee extension 0 5  Ankle dorsiflexion    Ankle plantarflexion    Ankle inversion    Ankle eversion     (Blank rows = not tested)  LOWER EXTREMITY MMT:  MMT Right eval Left eval Right 04/28/24 Left 04/28/24  Hip flexion 3+ 3+ 4 4  Hip extension      Hip abduction 3+ 3+ 4 4  Hip adduction      Hip internal rotation      Hip external rotation      Knee flexion 3+ 3+ 4 4  Knee extension 3+ 3+ 4 4  Ankle dorsiflexion      Ankle plantarflexion      Ankle inversion      Ankle eversion       (Blank rows = not tested)  LOWER EXTREMITY SPECIAL TESTS:  Knee special tests: Lachman Test: negative  FUNCTIONAL TESTS: 30 Second Sit to Stand: 8 reps 04/09/2024: 13 reps  06/04/24:  SLS: 3 sec each  GAIT: Distance walked: 65ft Assistive device utilized: None Level of assistance: Complete Independence Comments: decreased gait speed, antalgia L  TREATMENT: [facility] Adult PT Treatment:                                                DATE: 07/23/24 (Ankle braces donned for all exercises today) Therapeutic Exercise: Bike level 3 x 5 mins Seated hamstring curl 3x10 55# Seated knee extension x10 35# 3x6 SL - 15# Slant board gastroc stretch 2x45''  Neuromuscular re-ed: Semi-Tandem stance on foam 2x30 each - occasional LOB, able to self correct Side stepping on foam beam x 3 laps - CGA with several instance of LOB  Therapeutic Activity: Standing hip abd/ext 2x10 37.5# Sled push/pull 50# x4 laps STS to raised table - 3x5  [facility] Adult PT Treatment:                                                DATE: 07/15/24 (Ankle braces donned for all exercises today) Therapeutic Exercise: Bike level 3 x 5  mins Seated hamstring curl 2x10 55# Seated knee extension 2x10 35# Slant board gastroc stretch 3x30 Slant board heel raise 2x10 Neuromuscular re-ed: Semi-Tandem stance on foam 2x30 each - occasional LOB, able to self correct SLS on airex 2x30 ea (UE support today) Therapeutic Activity: Standing hip abd/ext 2x10 37.5# Sled push/pull 50# x4 laps  [facility] Adult PT Treatment:  DATE: 07/14/24 (Ankle braces donned for all exercises today) Therapeutic Exercise: Bike level 3 x 5 mins Seated hamstring curl 2x10 55# Seated knee extension 2x10 35# Neuromuscular re-ed: Semi-Tandem stance on foam 2x30 each - occasional LOB, able to self correct SLS on airex 2x30 ea (UE support today) Rockerboard AP 2x30 (UE support PRN) Therapeutic Activity: Standing hip abd/ext 2x10 37.5# Sled push/pull 50# x3 laps  [facility] Adult PT Treatment:                                                DATE: 07/09/24 (Ankle braces donned for all exercises today) Therapeutic Exercise: Bike level 3 x 5 mins Seated hamstring curl 2x10 55# Seated knee extension 2x10 35# Recumbent leg press plate 4 [id number] GTB around knees Seated hamstring stretch 2x30 BIL Neuromuscular re-ed: Semi-Tandem stance on foam 2x30 each - occasional LOB, able to self correct SLS on airex 2x30 ea (UE support today) Therapeutic Activity: Standing hip abd/ext 2x10 37.5# STS 2x10   PATIENT EDUCATION:  Education details: eval findings, LEFS, HEP, POC Person educated: Patient Education method: Explanation, Demonstration, and Handouts Education comprehension: verbalized understanding and returned demonstration  HOME EXERCISE PROGRAM: Access Code: [email or web address] URL: [email or web address] Date: 06/25/2024 Prepared by: Alm Kingdom  Exercises - Supine Heel Slide  - 1 x daily - 7 x weekly - 2 sets - 10 reps - 5 sec hold - Supine Quadricep Sets  - 1 x daily - 7 x weekly - 2 sets - 10 reps  - 5 sec hold - Active Straight Leg Raise with Quad Set  - 1 x daily - 7 x weekly - 3-4 sets - 5 reps - Sidelying Hip Abduction  - 1 x daily - 7 x weekly - 3-4 sets - 5 reps - Tandem Stance  - 1 x daily - 7 x weekly - 2-3 reps - 30 sec hold - Heel Toe Raises with Counter Support  - 1 x daily - 7 x weekly - 2 sets - 15 reps - Long Sitting Ankle Inversion with Anchored Resistance  - 1 x daily - 7 x weekly - 3 sets - 10 reps - green band hold - Seated Calf Raise With Small Ball at Heels  - 1 x daily - 7 x weekly - 3 sets - 15 reps  ASSESSMENT:  CLINICAL IMPRESSION: Pt was able to complete all prescribed exercises with no adverse effect. Continuing to work on balance combined with LE strengthening.  Pt with several instance of LOB in sagital plane with airex beam walking requiring PT support for safety.  Tried SL knee ext to avoid compensations.  EVAL: Patient is a 33 y.o. F who was seen today for physical therapy evaluation and treatment for bilateral knee pain and discomfort. Physical findings are consistent with injury/surgical hx along with bilateral LE weakness particularly in lateral hip and quad. Her LEFS score shows moderate disability in performance of home ADLs and community level activities. Pt would benefit from skilled PT services with specific focus on LE strengthening for improving movement and decreasing knee pain.   OBJECTIVE IMPAIRMENTS: Abnormal gait, decreased activity tolerance, decreased mobility, difficulty walking, decreased ROM, decreased strength, and pain  ACTIVITY LIMITATIONS: carrying, lifting, sitting, standing, squatting, stairs, transfers, and locomotion level  PARTICIPATION LIMITATIONS: meal prep, cleaning, driving, shopping, community activity, and yard work  PERSONAL FACTORS: Time since onset  of injury/illness/exacerbation and 3+ comorbidities: Bipolar disorder, Depression, intellectual disability, bilateral ACL reconstruction  are also affecting patient's  functional outcome.   REHAB POTENTIAL: Good  CLINICAL DECISION MAKING: Evolving/moderate complexity  EVALUATION COMPLEXITY: Moderate   GOALS: Goals reviewed with patient? No  SHORT TERM GOALS: Target date: 03/25/2024   Pt will be compliant and knowledgeable with initial HEP for improved comfort and carryover Baseline: initial HEP given  Goal status: MET Pt reports compliance 03/26/24  2.  Pt will self report bilateral knee pain no greater than 7/10 for improved comfort and functional ability Baseline: 10/10 at worst 04/09/2024: 6/10 Goal status: MET  LONG TERM GOALS: Target date: 08/19/2024   Pt will improve LEFS to no less than 50/80 as proxy for functional improvement with home ADLs and higher level community activity Baseline: 34/80 04/09/2024: 36/80 05/28/2024: 54/80 Goal status: MET  2.  Pt will self report bilateral knee pain no greater than 3/10 for improved comfort and functional ability Baseline: 10/10 at worst 04/09/2024: 6/10 04/28/2024: 5/10 Goal status: IN PROGRESS   3.  Pt will increase 30 Second Sit to Stand rep count to no less than 12 reps for improved balance, strength, and functional mobility Baseline: 8 reps 04/09/2024: 12 reps 05/26/2024: 14 reps Goal status: MET   4.  Pt will improve all LE MMT to no less than 4/5 for all tested motions for improved knee stabilization and decreased pain Baseline: see MMT chart Goal status: MET  5.  Pt will be able to squat with hands to floor without increase in bilateral knee pain for improved comfort and functional ability  Baseline: unable 04/28/2024: slight loss of balance, able to pick up small box Goal status: PARTIALLY MET    6.  Pt will be able to ambulate 2 miles without increase in knee pain or any instance of knee buckling for improve comfort and function Baseline: 1 miles Goal status: INITIAL  7.  Pt will improve bilateral SLS time to at least 20 seconds each for improved balance and ankle  stability Baseline: 3 seconds each Goal status: INITIAL   PLAN:  PT FREQUENCY: 1-2x/week  PT DURATION: 6 weeks  PLANNED INTERVENTIONS: 97164- PT Re-evaluation, 97110-Therapeutic exercises, 97530- Therapeutic activity, [id number]- Neuromuscular re-education, 97535- Self Care, [id number]- Manual therapy, [id number]- Gait training, H9716- Electrical stimulation (unattended), [id number]- Electrical stimulation (manual), 97016- Vasopneumatic device, Cryotherapy, and Moist heat  PLAN FOR NEXT SESSION: assess HEP response, quad and hip strengthening, progress functional lifting movements    Helene FORBES Gasmen, PT 07/23/2024, 12:28 PM

## 2024-07-28 ENCOUNTER — Other Ambulatory Visit: Payer: Self-pay

## 2024-07-28 ENCOUNTER — Emergency Department (HOSPITAL_COMMUNITY)
Admission: EM | Admit: 2024-07-28 | Discharge: 2024-07-29 | Disposition: A | Source: Ambulatory Visit | Attending: Emergency Medicine | Admitting: Emergency Medicine

## 2024-07-28 LAB — CBC WITH DIFFERENTIAL/PLATELET
Abs Immature Granulocytes: 0.02 K/uL (ref 0.00–0.07)
Basophils Absolute: 0 K/uL (ref 0.0–0.1)
Basophils Relative: 0 %
Eosinophils Absolute: 0 K/uL (ref 0.0–0.5)
Eosinophils Relative: 0 %
HCT: 34.9 % — ABNORMAL LOW (ref 36.0–46.0)
Hemoglobin: 11.6 g/dL — ABNORMAL LOW (ref 12.0–15.0)
Immature Granulocytes: 0 %
Lymphocytes Relative: 20 %
Lymphs Abs: 1.9 K/uL (ref 0.7–4.0)
MCH: 29.4 pg (ref 26.0–34.0)
MCHC: 33.2 g/dL (ref 30.0–36.0)
MCV: 88.6 fL (ref 80.0–100.0)
Monocytes Absolute: 0.6 K/uL (ref 0.1–1.0)
Monocytes Relative: 7 %
Neutro Abs: 6.8 K/uL (ref 1.7–7.7)
Neutrophils Relative %: 73 %
Platelets: 210 K/uL (ref 150–400)
RBC: 3.94 MIL/uL (ref 3.87–5.11)
RDW: 12.5 % (ref 11.5–15.5)
WBC: 9.4 K/uL (ref 4.0–10.5)
nRBC: 0 % (ref 0.0–0.2)

## 2024-07-28 LAB — COMPREHENSIVE METABOLIC PANEL WITH GFR
ALT: 20 U/L (ref 0–44)
AST: 18 U/L (ref 15–41)
Albumin: 4.2 g/dL (ref 3.5–5.0)
Alkaline Phosphatase: 107 U/L (ref 38–126)
Anion gap: 12 (ref 5–15)
BUN: 6 mg/dL (ref 6–20)
CO2: 24 mmol/L (ref 22–32)
Calcium: 9.3 mg/dL (ref 8.9–10.3)
Chloride: 104 mmol/L (ref 98–111)
Creatinine, Ser: 0.78 mg/dL (ref 0.44–1.00)
GFR, Estimated: >60 mL/min (ref >60)
Glucose, Bld: 97 mg/dL (ref 70–99)
Potassium: 3.4 mmol/L — ABNORMAL LOW (ref 3.5–5.1)
Sodium: 140 mmol/L (ref 135–145)
Total Bilirubin: 0.3 mg/dL (ref 0.0–1.2)
Total Protein: 7.1 g/dL (ref 6.5–8.1)

## 2024-07-28 LAB — ETHANOL: Alcohol, Ethyl (B): <15 mg/dL (ref <15)

## 2024-07-28 MED ORDER — STERILE WATER FOR INJECTION IJ SOLN
INTRAMUSCULAR | Status: AC
  Filled 2024-07-28: qty 10

## 2024-07-28 MED ORDER — MIDAZOLAM HCL 5 MG/5ML IJ SOLN: 5.0000 mg | Freq: Once | INTRAMUSCULAR | Status: DC

## 2024-07-28 MED ORDER — MIDAZOLAM HCL 5 MG/5ML IJ SOLN
5.0000 mg | Freq: Once | INTRAMUSCULAR | Status: AC
  Administered 2024-07-28: 5 mg via INTRAMUSCULAR

## 2024-07-28 MED ORDER — METFORMIN HCL 500 MG PO TABS
500.0000 mg | ORAL_TABLET | Freq: Every day | ORAL | Status: DC
  Filled 2024-07-28: qty 1

## 2024-07-28 MED ORDER — MIDAZOLAM HCL 2 MG/2ML IJ SOLN
INTRAMUSCULAR | Status: DC
  Filled 2024-07-28: qty 6

## 2024-07-28 MED ORDER — ZIPRASIDONE MESYLATE 20 MG IM SOLR
20.0000 mg | Freq: Once | INTRAMUSCULAR | Status: AC
  Administered 2024-07-28: 20 mg via INTRAMUSCULAR
  Filled 2024-07-28: qty 20

## 2024-07-28 NOTE — ED Provider Notes (Signed)
 [place] EMERGENCY DEPARTMENT AT [facility] Provider Note   CSN: [id number] Arrival date & time: 07/28/24  1647     Patient presents with: Psychiatric Evaluation   Cindy Phillips is a 33 y.o. female.   33 year old female with history of bipolar disorder and depression presents due to suicidal homicidal ideations.  Patient saw her therapist today and expressed thoughts when her other people.  Mother at bedside states that patient expressed to family member that she might harm herself.  Patient self does not want to answer any questions at this time.  According to mother, patient has been compliant with her medications.  States that her grandmother is currently dying and that this may have triggered some of the current behavior.  Appears to be very angry in her affect.  No further history obtainable       Prior to Admission medications   Medication Sig Start Date End Date Taking? Authorizing Provider  atorvastatin  (LIPITOR) 40 MG tablet Take 40 mg by mouth every evening.    [provider]  baclofen  (LIORESAL ) 10 MG tablet Take 10 mg by mouth. 05/06/23   [provider]  gabapentin  (NEURONTIN ) 300 MG capsule Take 300 mg by mouth every evening. 04/17/22   [provider]  hydrOXYzine  (ATARAX ) 50 MG tablet Take 1 tablet (50 mg total) by mouth 2 (two) times daily as needed for anxiety. 04/09/23   Raliegh Marsa RAMAN, DO  lamoTRIgine  (LAMICTAL ) 150 MG tablet Take 150 mg by mouth daily. 06/11/23   [provider]  LINZESS  72 MCG capsule Take 72 mcg by mouth every morning. 04/02/23   [provider]  meloxicam  (MOBIC ) 15 MG tablet Take 1 tablet (15 mg total) by mouth daily. 05/20/24   Sikora, Rebecca, DPM  metFORMIN  (GLUCOPHAGE ) 500 MG tablet Take one tablet by mouth in the morning and one tablet by mouth in the evening Patient taking differently: Take 500 mg by mouth daily with breakfast. 03/21/20   Verdon Parry D, MD   methylPREDNISolone  (MEDROL  DOSEPAK) 4 MG TBPK tablet Take as directed 07/15/24   Sikora, Rebecca, DPM  OLANZapine  (ZYPREXA ) 20 MG tablet Take 20 mg by mouth at bedtime. 06/04/23   [provider]  OLANZapine  zydis (ZYPREXA ) 10 MG disintegrating tablet Take 1 tablet (10 mg total) by mouth at bedtime. Patient taking differently: Take 15 mg by mouth at bedtime. 04/09/23   Pashayan, Marsa RAMAN, DO  ondansetron  (ZOFRAN -ODT) 4 MG disintegrating tablet Take 1 tablet (4 mg total) by mouth every 8 (eight) hours as needed for nausea or vomiting. 06/12/24   Banister, Pamela K, MD    Allergies: Patient has no known allergies.    Review of Systems  Unable to perform ROS: Psychiatric disorder    Updated Vital Signs BP (!) 185/115 (BP Location: Left Arm)   Pulse 99   Temp 98.5 F (36.9 C) (Oral)   Resp 20   SpO2 99%   Physical Exam Vitals and nursing note reviewed.  Constitutional:      General: She is not in acute distress.    Appearance: Normal appearance. She is well-developed. She is not toxic-appearing.  HENT:     Head: Normocephalic and atraumatic.  Eyes:     General: Lids are normal.     Conjunctiva/sclera: Conjunctivae normal.     Pupils: Pupils are equal, round, and reactive to light.  Neck:     Thyroid : No thyroid  mass.     Trachea: No tracheal deviation.  Cardiovascular:  Rate and Rhythm: Normal rate and regular rhythm.     Heart sounds: Normal heart sounds. No murmur heard.    No gallop.  Pulmonary:     Effort: Pulmonary effort is normal. No respiratory distress.     Breath sounds: Normal breath sounds. No stridor. No decreased breath sounds, wheezing, rhonchi or rales.  Abdominal:     General: There is no distension.     Palpations: Abdomen is soft.     Tenderness: There is no abdominal tenderness. There is no rebound.  Musculoskeletal:        General: No tenderness. Normal range of motion.     Cervical back: Normal range of motion and neck supple.   Skin:    General: Skin is warm and dry.     Findings: No abrasion or rash.  Neurological:     Mental Status: She is alert and oriented to person, place, and time. Mental status is at baseline.     GCS: GCS eye subscore is 4. GCS verbal subscore is 5. GCS motor subscore is 6.     Cranial Nerves: No cranial nerve deficit.     Sensory: No sensory deficit.     Motor: Motor function is intact.  Psychiatric:        Attention and Perception: She is inattentive.        Mood and Affect: Affect is inappropriate.        Speech: She is noncommunicative.        Behavior: Behavior is agitated and aggressive.     (all labs ordered are listed, but only abnormal results are displayed) Labs Reviewed  CBC WITH DIFFERENTIAL/PLATELET  COMPREHENSIVE METABOLIC PANEL WITH GFR  HCG, SERUM, QUALITATIVE  ETHANOL  URINE DRUG SCREEN    EKG: None  Radiology: No results found.   Procedures   Medications Ordered in the ED  ziprasidone  (GEODON ) injection 20 mg (has no administration in time range)  sterile water  (preservative free) injection (has no administration in time range)                                    Medical Decision Making Amount and/or Complexity of Data Reviewed Labs: ordered.  Risk Prescription drug management.   Patient uncooperative here despite long conversation with patient and her mother.  Patient placed under IVC.  Required Geodon  20 mg.  Patient became combative and then required Versed  5 mg.  Was placed on 4 restraints for her safety as well as safety of staff.  Gradually patient became more cooperative.  Was able to take away her restraints.  Labs are reassuring.  She is now medically cleared for psychiatric disposition.   CRITICAL CARE Performed by: Curtistine ONEIDA Dawn Total critical care time: 60 minutes Critical care time was exclusive of separately billable procedures and treating other patients. Critical care was necessary to treat or prevent imminent or  life-threatening deterioration. Critical care was time spent personally by me on the following activities: development of treatment plan with patient and/or surrogate as well as nursing, discussions with consultants, evaluation of patient's response to treatment, examination of patient, obtaining history from patient or surrogate, ordering and performing treatments and interventions, ordering and review of laboratory studies, ordering and review of radiographic studies, pulse oximetry and re-evaluation of patient's condition.      Final diagnoses:  None    ED Discharge Orders     None  Dasie Faden, MD 07/28/24 2235

## 2024-07-28 NOTE — ED Triage Notes (Signed)
 PT arrives accompanied by mother, Dickey Ada. Pts therapist contacted the mother, and urged her to bring the pt to the ER for a psychiatric evaluation. Therapist was concerned that the pt may harm others.   **During triage, pt would not speak, answer questions, or participate in any way.

## 2024-07-28 NOTE — ED Notes (Signed)
 Patient in bed chest rising and falling

## 2024-07-28 NOTE — ED Notes (Signed)
 Patient walked to the bathroom with security with no issues at this time. Patient calm and cooperative.

## 2024-07-28 NOTE — BH Assessment (Signed)
 Clinician spoke with IRIS to complete pt's TTS assessment. Clinician provided pt's name, MRN, location, age, room number and provider's name. Secure message completed.    Iris coordinator to update secure chat when assessment time and provider are assigned.  Jackson JONETTA Broach, MS, [facility], [facility] Triage Specialist [phone]

## 2024-07-28 NOTE — ED Notes (Addendum)
 Pt attempting to leave violent restraints; seizure pads in place and bed in lowest position for safety. Lights off in attempt to calm pt

## 2024-07-29 ENCOUNTER — Inpatient Hospital Stay (HOSPITAL_COMMUNITY): Admission: AD | Admit: 2024-07-29 | Discharge: 2024-08-03 | DRG: 885 | Disposition: A | Source: Intra-hospital

## 2024-07-29 ENCOUNTER — Encounter (HOSPITAL_COMMUNITY): Payer: Self-pay | Admitting: Psychiatry

## 2024-07-29 DIAGNOSIS — Z791 Long term (current) use of non-steroidal anti-inflammatories (NSAID): Secondary | ICD-10-CM | POA: Diagnosis not present

## 2024-07-29 DIAGNOSIS — Z781 Physical restraint status: Secondary | ICD-10-CM

## 2024-07-29 DIAGNOSIS — F79 Unspecified intellectual disabilities: Secondary | ICD-10-CM | POA: Diagnosis present

## 2024-07-29 DIAGNOSIS — F250 Schizoaffective disorder, bipolar type: Principal | ICD-10-CM | POA: Diagnosis present

## 2024-07-29 DIAGNOSIS — E669 Obesity, unspecified: Secondary | ICD-10-CM | POA: Diagnosis present

## 2024-07-29 DIAGNOSIS — M1990 Unspecified osteoarthritis, unspecified site: Secondary | ICD-10-CM | POA: Diagnosis present

## 2024-07-29 DIAGNOSIS — E785 Hyperlipidemia, unspecified: Secondary | ICD-10-CM | POA: Diagnosis present

## 2024-07-29 DIAGNOSIS — K5900 Constipation, unspecified: Secondary | ICD-10-CM | POA: Diagnosis present

## 2024-07-29 DIAGNOSIS — Z91148 Patient's other noncompliance with medication regimen for other reason: Secondary | ICD-10-CM

## 2024-07-29 DIAGNOSIS — G4733 Obstructive sleep apnea (adult) (pediatric): Secondary | ICD-10-CM | POA: Diagnosis present

## 2024-07-29 DIAGNOSIS — Z7984 Long term (current) use of oral hypoglycemic drugs: Secondary | ICD-10-CM | POA: Diagnosis not present

## 2024-07-29 DIAGNOSIS — Z6841 Body Mass Index (BMI) 40.0 and over, adult: Secondary | ICD-10-CM | POA: Diagnosis not present

## 2024-07-29 DIAGNOSIS — Z818 Family history of other mental and behavioral disorders: Secondary | ICD-10-CM

## 2024-07-29 DIAGNOSIS — E119 Type 2 diabetes mellitus without complications: Secondary | ICD-10-CM | POA: Diagnosis present

## 2024-07-29 DIAGNOSIS — F319 Bipolar disorder, unspecified: Secondary | ICD-10-CM | POA: Diagnosis not present

## 2024-07-29 DIAGNOSIS — R45851 Suicidal ideations: Secondary | ICD-10-CM | POA: Diagnosis not present

## 2024-07-29 DIAGNOSIS — Z833 Family history of diabetes mellitus: Secondary | ICD-10-CM | POA: Diagnosis not present

## 2024-07-29 DIAGNOSIS — R45850 Homicidal ideations: Secondary | ICD-10-CM | POA: Diagnosis not present

## 2024-07-29 DIAGNOSIS — E559 Vitamin D deficiency, unspecified: Secondary | ICD-10-CM | POA: Diagnosis present

## 2024-07-29 DIAGNOSIS — Z9151 Personal history of suicidal behavior: Secondary | ICD-10-CM

## 2024-07-29 DIAGNOSIS — Z79899 Other long term (current) drug therapy: Secondary | ICD-10-CM | POA: Diagnosis not present

## 2024-07-29 DIAGNOSIS — I10 Essential (primary) hypertension: Secondary | ICD-10-CM | POA: Diagnosis present

## 2024-07-29 LAB — URINE DRUG SCREEN
Amphetamines: NEGATIVE
Barbiturates: NEGATIVE
Benzodiazepines: POSITIVE — AB
Cocaine: NEGATIVE
Fentanyl: NEGATIVE
Methadone Scn, Ur: NEGATIVE
Opiates: NEGATIVE
Tetrahydrocannabinol: NEGATIVE

## 2024-07-29 LAB — GLUCOSE, CAPILLARY: Glucose-Capillary: 96 mg/dL (ref 70–99)

## 2024-07-29 LAB — PREGNANCY, URINE: Preg Test, Ur: NEGATIVE

## 2024-07-29 MED ORDER — HALOPERIDOL LACTATE 5 MG/ML IJ SOLN: 10.0000 mg | Freq: Three times a day (TID) | INTRAMUSCULAR | Status: DC | PRN

## 2024-07-29 MED ORDER — LORAZEPAM 2 MG/ML IJ SOLN: 2.0000 mg | Freq: Three times a day (TID) | INTRAMUSCULAR | Status: DC | PRN

## 2024-07-29 MED ORDER — DIPHENHYDRAMINE HCL 25 MG PO CAPS
50.0000 mg | ORAL_CAPSULE | Freq: Three times a day (TID) | ORAL | Status: DC | PRN
  Administered 2024-07-29 – 2024-07-31 (×3): 50 mg via ORAL
  Filled 2024-07-29 (×4): qty 2

## 2024-07-29 MED ORDER — HALOPERIDOL 5 MG PO TABS
5.0000 mg | ORAL_TABLET | Freq: Three times a day (TID) | ORAL | Status: DC | PRN
  Administered 2024-07-29 – 2024-07-31 (×3): 5 mg via ORAL
  Filled 2024-07-29 (×3): qty 1

## 2024-07-29 MED ORDER — DIPHENHYDRAMINE HCL 50 MG/ML IJ SOLN: 50.0000 mg | Freq: Three times a day (TID) | INTRAMUSCULAR | Status: DC | PRN

## 2024-07-29 MED ORDER — OLANZAPINE 5 MG PO TABS: 5.0000 mg | ORAL_TABLET | Freq: Three times a day (TID) | ORAL | Status: DC | PRN

## 2024-07-29 MED ORDER — OLANZAPINE 10 MG IM SOLR: 5.0000 mg | Freq: Three times a day (TID) | INTRAMUSCULAR | Status: DC | PRN

## 2024-07-29 MED ORDER — IBUPROFEN 200 MG PO TABS
600.0000 mg | ORAL_TABLET | Freq: Once | ORAL | Status: AC
  Administered 2024-07-29: 600 mg via ORAL
  Filled 2024-07-29: qty 3

## 2024-07-29 MED ORDER — HALOPERIDOL LACTATE 5 MG/ML IJ SOLN: 5.0000 mg | Freq: Three times a day (TID) | INTRAMUSCULAR | Status: DC | PRN

## 2024-07-29 MED ORDER — LORAZEPAM 2 MG/ML IJ SOLN: 1.0000 mg | Freq: Three times a day (TID) | INTRAMUSCULAR | Status: DC | PRN

## 2024-07-29 MED ORDER — LORAZEPAM 1 MG PO TABS: 1.0000 mg | ORAL_TABLET | Freq: Three times a day (TID) | ORAL | Status: DC | PRN

## 2024-07-29 NOTE — ED Notes (Signed)
 Patient resting in bed breathing eyes closed chest rising and falling.

## 2024-07-29 NOTE — Consult Note (Signed)
 [facility] Consult Note  Patient Name: Cindy Phillips MRN: [id number] DOB: [birth year] DATE OF Consult: 07/29/2024  PRIMARY PSYCHIATRIC DIAGNOSES  1.  Bipolar disorder 2.  Suicidal ideation  3.   Homicidal ideation  RECOMMENDATIONS  Admit to inpatient psych for safety and Stabilization.  Medication recommendations:  Patient is unable to state her current medications. Please reconcile patient's home medications and continue them as prescribed. Give Zyprexa  5mg  PO or IM Q8H PRN and Lorazepam  1mg  PO or IM Q8H PRN for agitation (Zyprexa  and Lorazepam  should be given 1 hour apart due to risk of respiratory depression). Please do not exceed 20 mg of Zyprexa  within a 24-hour period. Please stop all antipsychotic and QTc prolonging medications if patient's QTc is greater than 500 ms.  Non-Medication/therapeutic recommendations:  Maintain close observation and implement suicide precautions. Refer to outpatient psychiatric provider for medication management and therapy upon discharge from inpatient psych admission.   Communication: Treatment team members (and family members if applicable) who were involved in treatment/care discussions and planning, and with whom we spoke or engaged with via secure text/chat, include the following: patient's treatment team.   Thank you for involving us  in the care of this patient. If you have any additional questions or concerns, please call [phone] and ask for me or the provider on-call.  TELEPSYCHIATRY ATTESTATION & CONSENT  As the provider for this telehealth consult, I attest that I verified the patients identity using two separate identifiers, introduced myself to the patient, provided my credentials, disclosed my location, and performed this encounter via a HIPAA-compliant, real-time, face-to-face, two-way, interactive audio and video platform and with the full consent and agreement of the patient (or guardian as applicable.)  Patient physical  location: [facility]. Telehealth provider physical location: home office in state of [place].  Video start time: 0114 (Central Time) Video end time: 0125 (Central Time)  IDENTIFYING DATA  Cindy Phillips is a 33 y.o. year-old female for whom a psychiatric consultation has been ordered by the primary provider. The patient was identified using two separate identifiers.  CHIEF COMPLAINT/REASON FOR CONSULT  - I got a lot of stuff on my mind. - Presented for evaluation due to suicidal and homicidal ideations, with recent expression of intent to harm self and family, leading to involuntary commitment for safety.  HISTORY OF PRESENT ILLNESS (HPI)  A 33 year old woman was evaluated following involuntary commitment due to concerns regarding her mental health and safety. She presented with significant psychological distress, describing having a lot of stuff on her mind, though she was reluctant to elaborate on specific stressors or triggers. Depressive symptoms were acknowledged, and she admitted to experiencing thoughts of self-harm, though she characterized these as mild and without a specific plan. A prior suicide attempt was disclosed, though details regarding the method and timing were limited, with the event described as occurring a while back.  Recent history includes admission to a mental health facility earlier this year for similar reasons, though the exact date was not recalled. She reported adherence to prescribed medications but was unable to specify the names or dosages. Sleep and appetite were described as adequate, and she currently lives alone, supported by disability care. She denied current substance use, including tobacco, alcohol, and illicit drugs.  Psychotic symptoms were noted, with intermittent auditory hallucinations reported. The voices were described as commanding, at times instructing her to go and, on occasion, to harm herself or others, specifically her family.  These homicidal and suicidal ideations were attributed  to the voices rather than personal intent or anger. She confirmed ongoing outpatient psychiatric care, seeing her therapist weekly, and described her family as generally supportive. Admission for safety and stabilization was recommended due to the presence of active suicidal and homicidal ideations in the context of a history of bipolar disorder and psychosis.  PAST PSYCHIATRIC HISTORY  - History of bipolar disorder noted. Previous suicide attempt reported, described as an attempt to kill herself, though details were not elaborated. Homicidal ideation towards family has also been present, attributed to auditory hallucinations. - Recent psychiatric admission earlier this year for similar symptoms; specific dates and facility not recalled. Engaged in weekly therapy sessions. Otherwise as per HPI above.  PAST MEDICAL HISTORY  Past Medical History:  Diagnosis Date   Arthritis    Bipolar 1 disorder (HCC)    Bowel incontinence    Depression    Diabetes mellitus without complication (HCC)    Headache    migraines   Mental disability    Sleep apnea      HOME MEDICATIONS  Facility Ordered Medications  Medication   [COMPLETED] sterile water  (preservative free) injection   [COMPLETED] ziprasidone  (GEODON ) injection 20 mg   [COMPLETED] midazolam  (VERSED ) 5 MG/5ML injection 5 mg   metFORMIN  (GLUCOPHAGE ) tablet 500 mg   PTA Medications  Medication Sig   atorvastatin  (LIPITOR) 40 MG tablet Take 40 mg by mouth every evening.   metFORMIN  (GLUCOPHAGE ) 500 MG tablet Take one tablet by mouth in the morning and one tablet by mouth in the evening (Patient taking differently: Take 500 mg by mouth daily with breakfast.)   gabapentin  (NEURONTIN ) 300 MG capsule Take 300 mg by mouth every evening.   LINZESS  72 MCG capsule Take 72 mcg by mouth every morning.   hydrOXYzine  (ATARAX ) 50 MG tablet Take 1 tablet (50 mg total) by mouth 2 (two) times daily as  needed for anxiety.   OLANZapine  zydis (ZYPREXA ) 10 MG disintegrating tablet Take 1 tablet (10 mg total) by mouth at bedtime. (Patient taking differently: Take 15 mg by mouth at bedtime.)   baclofen  (LIORESAL ) 10 MG tablet Take 10 mg by mouth.   lamoTRIgine  (LAMICTAL ) 150 MG tablet Take 150 mg by mouth daily.   OLANZapine  (ZYPREXA ) 20 MG tablet Take 20 mg by mouth at bedtime.   meloxicam  (MOBIC ) 15 MG tablet Take 1 tablet (15 mg total) by mouth daily.   ondansetron  (ZOFRAN -ODT) 4 MG disintegrating tablet Take 1 tablet (4 mg total) by mouth every 8 (eight) hours as needed for nausea or vomiting.   methylPREDNISolone  (MEDROL  DOSEPAK) 4 MG TBPK tablet Take as directed     ALLERGIES  No Known Allergies  SOCIAL & SUBSTANCE USE HISTORY  Social History   Socioeconomic History   Marital status: Single    Spouse name: Not on file   Number of children: 0   Years of education: Not on file   Highest education level: Not on file  Occupational History   Occupation: Nature Conservation Officer     Comment: [employer]   Tobacco Use   Smoking status: Never   Smokeless tobacco: Never  Vaping Use   Vaping status: Never Used  Substance and Sexual Activity   Alcohol use: Not Currently   Drug use: No   Sexual activity: Not Currently  Other Topics Concern   Not on file  Social History Narrative   Not on file   Social Drivers of Health   Financial Resource Strain: Not on file  Food Insecurity: No  Food Insecurity (04/06/2023)   Hunger Vital Sign    Worried About Running Out of Food in the Last Year: Never true    Ran Out of Food in the Last Year: Never true  Transportation Needs: No Transportation Needs (04/06/2023)   PRAPARE - Administrator, Civil Service (Medical): No    Lack of Transportation (Non-Medical): No  Physical Activity: Not on file  Stress: Not on file  Social Connections: Not on file   Social History   Tobacco Use  Smoking Status Never  Smokeless Tobacco Never   Social History    Substance and Sexual Activity  Alcohol Use Not Currently   Social History   Substance and Sexual Activity  Drug Use No    Additional pertinent information She lives alone and receives disability care for financial support. Employment is not current. Family is described as supportive.SABRA  FAMILY HISTORY  Family History  Problem Relation Age of Onset   High blood pressure Mother    Anxiety disorder Mother    Diabetes Maternal Grandmother    Family Psychiatric History (if known):  not really  MENTAL STATUS EXAM (MSE)  Mental Status Exam: General Appearance: Appropriate  Orientation:  Full (Time, Place, and Person)  Memory:  fair  Concentration:  fair  Recall:  Fair  Attention  Fair  Eye Contact:  Poor  Speech:  Clear and Coherent  Language:  Good  Volume:  Normal  Mood: Depressed.  Affect:  Constricted  Thought Process:  Coherent  Thought Content:   She endorsed having a lot on her mind and feeling depressed. She reported passive suicidal ideation, stating she feels like she wants to harm herself a little bit, but not much. She described a prior suicide attempt and acknowledged hearing voices that sometimes tell her you must go and, at times, to harm herself or others, including her family. There is clear evidence of auditory hallucinations and command hallucinations. There is no mention of visual hallucinations,  Suicidal Thoughts:   She endorsed passive suicidal ideation   Homicidal Thoughts:  She also endorsed homicidal ideation towards her family, primarily as a result of command auditory hallucinations.  Judgement:  Impaired  Insight:  Lacking  Psychomotor Activity:  Normal  Akathisia:  Negative  Fund of Knowledge:  Fair    Assets:  Engineer, Maintenance Social Support  Cognition:  WNL  ADL's:  Intact  AIMS (if indicated):       VITALS  Blood pressure (!) 149/110, pulse 78, temperature 98.5 F (36.9 C), temperature source Oral, resp. rate 20, SpO2  99%.  LABS  Admission on 07/28/2024  Component Date Value Ref Range Status   WBC 07/28/2024 9.4  4.0 - 10.5 K/uL Final   RBC 07/28/2024 3.94  3.87 - 5.11 MIL/uL Final   Hemoglobin 07/28/2024 11.6 (L)  12.0 - 15.0 g/dL Final   HCT 87/90/7974 34.9 (L)  36.0 - 46.0 % Final   MCV 07/28/2024 88.6  80.0 - 100.0 fL Final   MCH 07/28/2024 29.4  26.0 - 34.0 pg Final   MCHC 07/28/2024 33.2  30.0 - 36.0 g/dL Final   RDW 87/90/7974 12.5  11.5 - 15.5 % Final   Platelets 07/28/2024 210  150 - 400 K/uL Final   nRBC 07/28/2024 0.0  0.0 - 0.2 % Final   Neutrophils Relative % 07/28/2024 73  % Final   Neutro Abs 07/28/2024 6.8  1.7 - 7.7 K/uL Final   Lymphocytes Relative 07/28/2024 20  %  Final   Lymphs Abs 07/28/2024 1.9  0.7 - 4.0 K/uL Final   Monocytes Relative 07/28/2024 7  % Final   Monocytes Absolute 07/28/2024 0.6  0.1 - 1.0 K/uL Final   Eosinophils Relative 07/28/2024 0  % Final   Eosinophils Absolute 07/28/2024 0.0  0.0 - 0.5 K/uL Final   Basophils Relative 07/28/2024 0  % Final   Basophils Absolute 07/28/2024 0.0  0.0 - 0.1 K/uL Final   Immature Granulocytes 07/28/2024 0  % Final   Abs Immature Granulocytes 07/28/2024 0.02  0.00 - 0.07 K/uL Final   Performed at [facility], [address], [place], [place] [address]   Sodium 07/28/2024 140  135 - 145 mmol/L Final   Potassium 07/28/2024 3.4 (L)  3.5 - 5.1 mmol/L Final   Chloride 07/28/2024 104  98 - 111 mmol/L Final   CO2 07/28/2024 24  22 - 32 mmol/L Final   Glucose, Bld 07/28/2024 97  70 - 99 mg/dL Final   Glucose reference range applies only to samples taken after fasting for at least 8 hours.   BUN 07/28/2024 6  6 - 20 mg/dL Final   Creatinine, Ser 07/28/2024 0.78  0.44 - 1.00 mg/dL Final   Calcium  07/28/2024 9.3  8.9 - 10.3 mg/dL Final   Total Protein 87/90/7974 7.1  6.5 - 8.1 g/dL Final   Albumin 87/90/7974 4.2  3.5 - 5.0 g/dL Final   AST 87/90/7974 18  15 - 41 U/L Final   ALT 07/28/2024 20  0 - 44 U/L Final    Alkaline Phosphatase 07/28/2024 107  38 - 126 U/L Final   Total Bilirubin 07/28/2024 0.3  0.0 - 1.2 mg/dL Final   GFR, Estimated 07/28/2024 >60  >60 mL/min Final   Comment: (NOTE) Calculated using the CKD-EPI Creatinine Equation (2021)    Anion gap 07/28/2024 12  5 - 15 Final   Performed at [facility], [address] [place], [place] [address]   Alcohol, Ethyl (B) 07/28/2024 <15  <15 mg/dL Final   Comment: (NOTE) For medical purposes only. Performed at [facility], [address], [place], [place] [address]    Opiates 07/28/2024 NEGATIVE  NEGATIVE Final   Cocaine 07/28/2024 NEGATIVE  NEGATIVE Final   Benzodiazepines 07/28/2024 POSITIVE (A)  NEGATIVE Final   Amphetamines 07/28/2024 NEGATIVE  NEGATIVE Final   Tetrahydrocannabinol 07/28/2024 NEGATIVE  NEGATIVE Final   Barbiturates 07/28/2024 NEGATIVE  NEGATIVE Final   Methadone Scn, Ur 07/28/2024 NEGATIVE  NEGATIVE Final   Fentanyl  07/28/2024 NEGATIVE  NEGATIVE Final   Comment: (NOTE) Drug screen is for Medical Purposes only. Positive results are preliminary only. If confirmation is needed, notify lab within 5 days.  Drug Class                 Cutoff (ng/mL) Amphetamine and metabolites 1000 Barbiturate and metabolites 200 Benzodiazepine              200 Opiates and metabolites     300 Cocaine and metabolites     300 THC                         50 Fentanyl                     5 Methadone                   300  Trazodone  is metabolized in vivo to several metabolites,  including pharmacologically active m-CPP, which is excreted in the  urine.  Immunoassay screens for amphetamines and MDMA have potential  cross-reactivity with these compounds and may provide false positive  result.  Performed at [facility], [address], [place], [place] [address]     PSYCHIATRIC REVIEW OF SYSTEMS (ROS)  ROS: Notable for the following relevant positive  findings: ROS  Additional findings:      Musculoskeletal: No abnormal movements observed      Gait & Station: Laying/Sitting      Pain Screening: Denies      Nutrition & Dental Concerns: none reported  RISK FORMULATION/ASSESSMENT  Is the patient experiencing any suicidal or homicidal ideations: Yes       Explain if yes: She endorsed passive suicidal ideation and a history of suicide attempt. She also endorsed homicidal ideation towards her family, primarily as a result of command auditory hallucinations. Protective factors considered for safety management: include weekly therapy, supportive family, and engagement with mental health services  Risk factors/concerns considered for safety management:  include history of bipolar disorder, prior suicide attempt, current passive suicidal ideation, current homicidal ideation, command auditory hallucinations, unemployment, living alone, and recent psychiatric hospitalization. Modifiable risk factors include ongoing stressors related to family, active mental health symptoms, and possible medication non-adherence.  Is there a safety management plan with the patient and treatment team to minimize risk factors and promote protective factors: Yes           Explain: admit to psych Is crisis care placement or psychiatric hospitalization recommended: Yes     Based on my current evaluation and risk assessment, patient is determined at this time to be at:  Long-term suicide risk is high given multiple risk factors and chronic mental health issues. Short-term suicide risk is moderate to high due to current ideation, history of attempt, and presence of command hallucinations.  *RISK ASSESSMENT Risk assessment is a dynamic process; it is possible that this patient's condition, and risk level, may change. This should be re-evaluated and managed over time as appropriate. Please re-consult psychiatric consult services if additional assistance is needed in terms of  risk assessment and management. If your team decides to discharge this patient, please advise the patient how to best access emergency psychiatric services, or to call 911, if their condition worsens or they feel unsafe in any way.   Ines Hock, NP Telepsychiatry Consult Services

## 2024-07-29 NOTE — Progress Notes (Addendum)
 Cindy CHRISTELLA Boyer, RN, 07/29/24, Time of arrival: 1501  Patient is a new admit to unit. Patient is Involuntary. Patient belongings addressed and stored. Skin check performed with two staff, results unremarkable. Vital signs unremarkable with exception of Blood pressure 155/124 which provider was notified. No reported or observed physiological concerns or abnormalities. Patient engaged with assessment with encouragement. Patient orientated to facility, unit and room. All questions and concerns addressed at this time.  Patient stated reason for admission/reason for being here: theres no reason why I should be here 'Im hearing voices to do bad things   Patient current presentation remarkable for: Loud and needing encouragement to complete admission

## 2024-07-29 NOTE — ED Notes (Signed)
 Pt appears to be very angry. Nurse attempted to talk to pt and obtain labs. Pt declined and became very agitated. Pt declined ziprasidone  and at least 5 security officers and staff members had to restrain pt to administer medication. Violent restraints applied

## 2024-07-29 NOTE — ED Notes (Signed)
 Patient walked back to room without incident. Patient ate breakfast without incident but refused medications. Patient will not verbally respond to questions, just shakes head for yes and no. Patient is non combative. 1:1 sitter reports no incidents.

## 2024-07-29 NOTE — Progress Notes (Signed)
 Pt has been accepted to [facility] on 07/29/2024 Bed assignment: 504-01  Pt meets inpatient criteria per: Ines Hock NP  Attending Physician will be: Dr. Prentis    Report can be called to: unit: [phone]  Pt can arrive after [facility] WILL UPDATE   Care Team Notified: [facility] Cherylynn Ernst RN, Alyssa Godaire RN  [place] Li Bobo LCSW-A   07/29/2024 8:48 AM

## 2024-07-29 NOTE — Progress Notes (Signed)
 Patient is accepted to [facility] BICU pending discharges  Room/Bed  Pending discharges  Accepting Sherifat Velna Np  Attending Dr Prentis  Call report to   [phone]  Patient to arrive at  when notified by the day shift HS  Still waiting for med clearance labs to include EKG and pregnancy test. Information was relayed to [facility] via secure chat.

## 2024-07-29 NOTE — Plan of Care (Signed)
 Pt was out in the milieu during the day acting appropriately. Went to [employer] and ate adequately. Denies SI/HI/SH/paranoia. She does endorse CAH to kill herself but she doesn't want to.She also endorses seeing dead people all the time. She asked for agitation medication since they didn't give me any at the other hospital. Will continue to monitor.

## 2024-07-29 NOTE — [facility] Group Notes (Signed)
 [facility] Group Notes:  (Nursing/MHT/Case Management/Adjunct)  Date:  07/29/2024  Time:  8:55 PM  Type of Therapy:  Psychoeducational Skills  Participation Level:  Did Not Attend  Participation Quality:  Did Not Attend   Affect:  Did Not Attend   Cognitive:  Did Not Attend  Insight:  None  Engagement in Group:  Did Not Attend  Modes of Intervention:  Education  Summary of Progress/Problems: Patient did not attend group this evening.   [name] 07/29/2024, 8:55 PM

## 2024-07-29 NOTE — ED Notes (Signed)
 Patient refused morning medicaiton.

## 2024-07-29 NOTE — ED Notes (Signed)
 Dispatch called, PD on the way.

## 2024-07-29 NOTE — ED Notes (Signed)
 Pt report given to Juliene, RN at [facility]. Bed is not ready yet.

## 2024-07-29 NOTE — ED Notes (Signed)
 Patient resting in bed.

## 2024-07-29 NOTE — Tx Team (Signed)
 Initial Treatment Plan 07/29/2024 4:36 PM Cindy Phillips [id number]    PATIENT STRESSORS: Medication change or noncompliance     PATIENT STRENGTHS: Supportive family/friends    PATIENT IDENTIFIED PROBLEMS: Hearing Voices                     DISCHARGE CRITERIA:  Improved stabilization in mood, thinking, and/or behavior Reduction of life-threatening or endangering symptoms to within safe limits  PRELIMINARY DISCHARGE PLAN: Attend aftercare/continuing care group Return to previous living arrangement  PATIENT/FAMILY INVOLVEMENT: This treatment plan has been presented to and reviewed with the patient, Cindy Phillips The patient has been given the opportunity to ask questions and make suggestions.  Ronnald CHRISTELLA Boyer, RN 07/29/2024, 4:36 PM

## 2024-07-29 NOTE — ED Notes (Addendum)
 SABRA

## 2024-07-29 NOTE — ED Provider Notes (Addendum)
 Emergency Medicine Observation Re-evaluation Note  Cindy Phillips is a 33 y.o. female, seen on rounds today.  Pt initially presented to the ED for complaints of Psychiatric Evaluation Currently, the patient is resting.  Physical Exam  BP 135/70 (BP Location: Left Arm)   Pulse 99   Temp 98.9 F (37.2 C) (Oral)   Resp 18   Ht 6' (1.829 m)   Wt (!) 143.8 kg   SpO2 99%   BMI 42.99 kg/m  Physical Exam General: NAD Cardiac: Well perfused Lungs: Even and unlabored Psych: no agitation  ED Course / MDM  EKG:   I have reviewed the labs performed to date as well as medications administered while in observation.  Recent changes in the last 24 hours include evaluated by psychiatry, recommendations included inpatient.  Notified  by nursing that patient was complaining of ankle pain on the left.  She states that she had been seen outpatient for this and was diagnosed with tendinitis, denies any recent falls or trauma.  She has no tenderness to palpation about the medial or lateral malleolus.  Range of motion of the ankle is intact, distal pulses are intact.  Suspect likely mild tendinitis for which Motrin  was provided.  Plan  Current plan is for inpatient psychiatric management.    Jerrol Agent, MD 07/29/24 [id number]    Jerrol Agent, MD 07/29/24 [phone]

## 2024-07-30 LAB — GLUCOSE, CAPILLARY: Glucose-Capillary: 95 mg/dL (ref 70–99)

## 2024-07-30 MED ORDER — OLANZAPINE 10 MG PO TABS: 20.0000 mg | ORAL_TABLET | Freq: Every day | ORAL | Status: DC

## 2024-07-30 MED ORDER — METFORMIN HCL 500 MG PO TABS
500.0000 mg | ORAL_TABLET | Freq: Every day | ORAL | Status: DC
  Administered 2024-07-31 – 2024-08-03 (×4): 500 mg via ORAL
  Filled 2024-07-30 (×4): qty 1

## 2024-07-30 MED ORDER — OLANZAPINE 10 MG PO TABS
20.0000 mg | ORAL_TABLET | Freq: Every day | ORAL | Status: DC
  Administered 2024-07-30 – 2024-08-02 (×4): 20 mg via ORAL
  Filled 2024-07-30 (×5): qty 2

## 2024-07-30 MED ORDER — GABAPENTIN 300 MG PO CAPS
300.0000 mg | ORAL_CAPSULE | Freq: Every evening | ORAL | Status: DC
  Administered 2024-07-30 – 2024-08-02 (×4): 300 mg via ORAL
  Filled 2024-07-30 (×4): qty 1

## 2024-07-30 MED ORDER — NAPROXEN 250 MG PO TABS
250.0000 mg | ORAL_TABLET | Freq: Three times a day (TID) | ORAL | Status: DC
  Administered 2024-07-30 – 2024-08-03 (×11): 250 mg via ORAL
  Filled 2024-07-30 (×11): qty 1

## 2024-07-30 MED ORDER — ATORVASTATIN CALCIUM 40 MG PO TABS
40.0000 mg | ORAL_TABLET | Freq: Every evening | ORAL | Status: DC
  Administered 2024-07-30 – 2024-08-02 (×4): 40 mg via ORAL
  Filled 2024-07-30 (×4): qty 1

## 2024-07-30 MED ORDER — LAMOTRIGINE 25 MG PO TABS
50.0000 mg | ORAL_TABLET | Freq: Every day | ORAL | Status: DC
  Administered 2024-07-31 – 2024-08-03 (×4): 50 mg via ORAL
  Filled 2024-07-30 (×4): qty 2

## 2024-07-30 MED ORDER — AMLODIPINE BESYLATE 5 MG PO TABS
5.0000 mg | ORAL_TABLET | Freq: Every day | ORAL | Status: DC
  Administered 2024-07-30 – 2024-08-03 (×5): 5 mg via ORAL
  Filled 2024-07-30 (×5): qty 1

## 2024-07-30 NOTE — Plan of Care (Signed)
   Problem: Education: Goal: Knowledge of [facility] Education information/materials will improve Outcome: Progressing Goal: Emotional status will improve Outcome: Progressing Goal: Mental status will improve Outcome: Progressing Goal: Verbalization of understanding the information provided will improve Outcome: Progressing

## 2024-07-30 NOTE — Group Note (Signed)
 Recreation Therapy Group Note   Group Topic:Relaxation  Group Date: 07/30/2024 Start Time: 1010 End Time: 1048 Facilitators: Quinto Tippy-McCall, LRT,CTRS Location: [address] Dayroom   Group Topic: Music Therapy  Goal Area(s) Addresses:  Patient will identify how music can be used for stress management. Patient will identify benefits of using stress management post d/c.  Behavioral Response:   Intervention: Music  Activity: Patients were given the opportunity to pick songs that are influential and impact them. Patients had to choose songs that were clean and appropriate during group session.    Education:  Stress Management, Discharge Planning.   Education Outcome: Acknowledges Education   Affect/Mood: N/A   Participation Level: Did not attend    Clinical Observations/Individualized Feedback:      Plan: Continue to engage patient in RT group sessions 2-3x/week.   Kailene Steinhart-McCall, LRT,CTRS 07/30/2024 11:32 AM

## 2024-07-30 NOTE — BHH Counselor (Signed)
 Adult Comprehensive Assessment  Patient ID: Cindy Phillips, female   DOB: 12-11-1990, 33 y.o.   MRN: [id number]  Information Source: Information source: Patient  Current Stressors:  Patient states their primary concerns and needs for treatment are:: Lots of things.  Pt is concerned about their thoughts of self-harming. Patient states their goals for this hospitilization and ongoing recovery are:: I don't really know.  I guess not to have thoughts of self-harming. Educational / Learning stressors: None reported. Employment / Job issues: Unemployed. Family Relationships: They are okay. Financial / Lack of resources (include bankruptcy): None reported. Housing / Lack of housing: None reported. Physical health (include injuries & life threatening diseases): Not that good. Social relationships: Okay. Substance abuse: Pt denies the use of substances. Bereavement / Loss: None reported.  Living/Environment/Situation:  Living Arrangements: Alone Living conditions (as described by patient or guardian): I live in governmental housing. Who else lives in the home?: No one else lives with patient. How long has patient lived in current situation?: 4 months What is atmosphere in current home: Supportive  Family History:  Marital status: Single Are you sexually active?: No What is your sexual orientation?: Homosexual Has your sexual activity been affected by drugs, alcohol, medication, or emotional stress?: N/A Does patient have children?: No  Childhood History:  By whom was/is the patient raised?: Mother Additional childhood history information: n/a Description of patient's relationship with caregiver when they were a child: Not really good. Patient's description of current relationship with people who raised him/her: Not really good. How were you disciplined when you got in trouble as a child/adolescent?: Pt reported not receiving any discipline as a child. Does patient have  siblings?: Yes Number of Siblings: 4 Description of patient's current relationship with siblings: Pt reported having one brother from her mother, she doesnt have a relationship with him, he lives out of town.  She has 3 half-siblings on father's side. Did patient suffer any verbal/emotional/physical/sexual abuse as a child?: No Did patient suffer from severe childhood neglect?: No Has patient ever been sexually abused/assaulted/raped as an adolescent or adult?: No Was the patient ever a victim of a crime or a disaster?: No Witnessed domestic violence?: No Has patient been affected by domestic violence as an adult?: No  Education:  Highest grade of school patient has completed: 12 Currently a student?: No Learning disability?: Yes What learning problems does patient have?: Pt endorses learning disability, however unable to vocalize what specifically she is either diagnosed with at this time.  Employment/Work Situation:   Employment Situation: Unemployed Patient's Job has Been Impacted by Current Illness: Yes Describe how Patient's Job has Been Impacted: Pt unable to work What is the Longest Time Patient has Held a Job?: 10 years Where was the Patient Employed at that Time?: [employer] Has Patient ever Been in the [employer]?: No  Financial Resources:   Surveyor, Quantity resources: [employer], [employer], Medicaid Does patient have a lawyer or guardian?: No  Alcohol/Substance Abuse:   What has been your use of drugs/alcohol within the last 12 months?: Denies the use of substances. If attempted suicide, did drugs/alcohol play a role in this?: No Alcohol/Substance Abuse Treatment Hx: Denies past history Has alcohol/substance abuse ever caused legal problems?: No  Social Support System:   Patient's Community Support System: [employer], Community Support System: Friends, some family. Type of faith/religion: Denies any religious affiliation.  She states, Oh, I don't get into any  of that. 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  Strengths/Needs:   What is the patient's perception of their strengths?: Pt reports she is strong and resilient. Patient states they can use these personal strengths during their treatment to contribute to their recovery: Yes, she is able to isolate and address mental health issues and when faced with situational challenge(s). Patient states these barriers may affect/interfere with their treatment: None reported. Patient states these barriers may affect their return to the community: None reported. Other important information patient would like considered in planning for their treatment: None reported.  Discharge Plan:   Currently receiving community mental health services: Yes (From Whom) ([facility]) Patient states concerns and preferences for aftercare planning are: None reported. Patient states they will know when they are safe and ready for discharge when: I don't know.  Maybe when my mind is clear and I feel better. Does patient have access to transportation?: Yes Does patient have financial barriers related to discharge medications?: No Patient description of barriers related to discharge medications: N/A Will patient be returning to same living situation after discharge?: Yes  Summary/Recommendations:   Summary and Recommendations (to be completed by the evaluator): Patient, Cindy Phillips, a 33 year old single African-American female who involuntarily presents to [facility] from [facility] for suicidal ideation and auditory hallucinations, as well.  Pt does not have a specific plan to hurt herself, however feels she has too much going on mentally and has reached breaking point when she could not stop the voices.  She actively attends  [facility] in [place] for outpatient services, which she reports as helpful.  Pt denies the use of any substances either present or past; results of her UDS upon admission indicate presence of benzodiazapines and no other substances as she reported.  She reports having an okay relationship with her mother who is her strongest support at this time along with her grandmother who is in the process of transitioning to death.  Mood is dysthymic; affect is flat to depressed; endorses AVH, denies active SI/HI/DI/SIB.  Thoughts are goal-oriented; speech is linear. While here, Jazminn can benefit from crisis stabilization, medication management, therapeutic milieu, and referrals for services.   Cindy JONELLE Blanch, LCSW 07/30/2024

## 2024-07-30 NOTE — Group Note (Signed)
 Date:  07/30/2024 Time:  8:50 PM  Group Topic/Focus:  Wrap-Up Group:   The focus of this group is to help patients review their daily goal of treatment and discuss progress on daily workbooks.    Participation Level:  Did Not Attend  Cindy Phillips 07/30/2024, 8:50 PM

## 2024-07-30 NOTE — BH Assessment (Signed)
(  Sleep Hours) - 10.5 (Any PRNs that were needed, meds refused, or side effects to meds)-  (Any disturbances and when (visitation, over night)- None (Concerns raised by the patient)- None (SI/HI/AVH)- Denies

## 2024-07-30 NOTE — BHH Group Notes (Signed)
 Adult Psychoeducational Group Note  Date:  07/30/2024 Time:  1:57 PM  Group Topic/Focus: Kellin group   Participation Level:  Did Not Attend Ronnell Puller 07/30/2024, 1:57 PM

## 2024-07-30 NOTE — Plan of Care (Signed)
   Problem: Education: Goal: Emotional status will improve Outcome: Progressing Goal: Mental status will improve Outcome: Progressing

## 2024-07-30 NOTE — Progress Notes (Incomplete)
° °(  Sleep Hours) -  (Any PRNs that were needed, meds refused, or side effects to meds)-   (Any disturbances and when (visitation, over night)-  (Concerns raised by the patient)-   (SI/HI/AVH)- Denies SI/HI; Endorses AH

## 2024-07-30 NOTE — BHH Suicide Risk Assessment (Cosign Needed Addendum)
 [facility] Admission Suicide Risk Assessment   Nursing information obtained from:  Patient Demographic factors:  Low socioeconomic status, Living alone Current Mental Status:  Suicidal ideation indicated by patient Loss Factors:  NA Historical Factors:  Impulsivity Risk Reduction Factors:  Positive social support  Principal Problem: Bipolar affective disorder (HCC) Diagnosis:  Principal Problem:   Bipolar affective disorder (HCC)   Subjective Data:   HPI: Cindy Phillips is a 33 y.o. female  with a past psychiatric history of schizoaffective disorder, bipolar type and intellectual disability. Patient initially arrived to [facility] on 12/9 for suicidal and homicidal ideations in the setting of command auditory hallucionations, and admitted to [facility] under IVC on 12/10 for acute safety concerns. PMHx is significant for intellectual disability, T2DM, hyperlipidemia, vitamin D  deficiency, OSA, pre-diabetes, bilateral ACL tears.   Continued Clinical Symptoms:  Alcohol Use Disorder Identification Test Final Score (AUDIT): 0 The Alcohol Use Disorders Identification Test, Guidelines for Use in Primary Care, Second Edition.  World Science Writer [facility]). Score between 0-7:  no or low risk or alcohol related problems. Score between 8-15:  moderate risk of alcohol related problems. Score between 16-19:  high risk of alcohol related problems. Score 20 or above:  warrants further diagnostic evaluation for alcohol dependence and treatment.   CLINICAL FACTORS:   Severe Anxiety and/or Agitation Panic Attacks Depression:   Aggression Hopelessness Severe Schizophrenia:   Command hallucinatons Depressive state Less than 70 years old Chronic Pain More than one psychiatric diagnosis Currently Psychotic Previous Psychiatric Diagnoses and Treatments   Physical Examination:  Physical Exam Constitutional:      General: She is not in acute distress.    Appearance: She is obese. She is not ill-appearing.   Pulmonary:     Effort: Pulmonary effort is normal. No respiratory distress.  Neurological:     Mental Status: She is alert.     Appearance: obese black woman laying in bed and speaking to provider  Behavior: Cooperative  Attitude: Accommodating  Speech: simpler syntax, normal rate, rhythm, tone, volume  Mood: Mad  Affect: Euthymic, noncongruent  Thought Process: Linear, logical, goal directed  Thought Content: Endorsed paranoia surrounding visual hallucinations she sees  SI/HI: Endorses recent active suicidal ideation with plan, denies HI at present  Perceptions: CAH to kill self  Judgement: fair  Insight: fair  Fund of Knowledge: WNL     Review of Systems  Constitutional:  Negative for chills and fever.  Gastrointestinal:  Negative for nausea and vomiting.  All other systems reviewed and are negative.  Blood pressure (!) 144/83, pulse 92, temperature 98.1 F (36.7 C), temperature source Oral, resp. rate 16, height 6' (1.829 m), weight (!) 142.9 kg, SpO2 100%. Body mass index is 42.72 kg/m.   COGNITIVE FEATURES THAT CONTRIBUTE TO RISK:  Closed-mindedness, Loss of executive function, and Polarized thinking    SUICIDE RISK:  Severe:  Command auditory hallucinations to end life, although ego-dystonic. Frequent, intense, and enduring suicidal ideation, specific plan, no subjective intent, but some objective markers of intent (i.e., choice of lethal method), the method is accessible, evidence of impaired self-control, severe dysphoria/symptomatology, multiple risk factors present, and few protective factors.  PLAN OF CARE: See H&P for assessment and plan.   I certify that inpatient services furnished can reasonably be expected to improve the patient's condition.   Christofer Shen, MD 07/30/2024, 7:39 PM

## 2024-07-30 NOTE — BHH Group Notes (Signed)
 Adult Psychoeducational Group Note  Date:  07/30/2024 Time:  11:47 AM  Group Topic/Focus: Nutrition group   Participation Level:  Did Not Attend   Cindy Phillips 07/30/2024, 11:47 AM

## 2024-07-30 NOTE — Progress Notes (Signed)
°   07/30/24 2300  Psych Admission Type (Psych Patients Only)  Admission Status Voluntary  Psychosocial Assessment  Patient Complaints Isolation  Eye Contact Fair  Facial Expression Animated  Affect Appropriate to circumstance  Speech Logical/coherent  Interaction Isolative  Motor Activity Other (Comment) (WDL)  Appearance/Hygiene Unremarkable  Behavior Characteristics Unable to participate  Mood Preoccupied  Thought Process  Coherency WDL  Content WDL  Delusions None reported or observed  Perception Hallucinations  Hallucination Auditory  Judgment Impaired  Confusion None  Danger to Self  Current suicidal ideation? Denies  Danger to Others  Danger to Others None reported or observed

## 2024-07-30 NOTE — BHH Group Notes (Signed)
 Adult Psychoeducational Group Note  Date:  07/30/2024 Time:  9:53 AM  Group Topic/Focus:  Goals Group:   The focus of this group is to help patients establish daily goals to achieve during treatment and discuss how the patient can incorporate goal setting into their daily lives to aide in recovery. Orientation:   The focus of this group is to educate the patient on the purpose and policies of crisis stabilization and provide a format to answer questions about their admission.  The group details unit policies and expectations of patients while admitted.  Participation Level:  Did Not Attend  Participation Quality:    Affect:    Cognitive:    Insight:   Engagement in Group:    Modes of Intervention:    Additional Comments:    Cindy Phillips O 07/30/2024, 9:53 AM

## 2024-07-30 NOTE — Plan of Care (Signed)
   Problem: Education: Goal: Emotional status will improve Outcome: Not Progressing Goal: Mental status will improve Outcome: Not Progressing

## 2024-07-30 NOTE — H&P (Cosign Needed Addendum)
 Psychiatric Admission Assessment Adult  Patient Identification:  Cindy Phillips MRN:  [id number] Date of Evaluation:  07/30/2024 Chief Complaint:  Bipolar affective disorder (HCC) [F31.9] Principal Diagnosis:  Bipolar affective disorder (HCC) Diagnosis:  Principal Problem:   Bipolar affective disorder (HCC)    SUBJECTIVE:  CC:   Grandma is getting ready to leave soon  HPI: Cindy Phillips is a 33 y.o. female  with a past psychiatric history of schizoaffective disorder, bipolar type and intellectual disability. Patient initially arrived to [facility] on 12/9 for suicidal and homicidal ideations in the setting of command auditory hallucionations, and admitted to [facility] under IVC on 12/10 for acute safety concerns. PMHx is significant for intellectual disability, T2DM, hyperlipidemia, vitamin D  deficiency, OSA, pre-diabetes, bilateral ACL tears.   Initial assessment on 07/30/2024 , the patient reports that her grandma is getting ready to leave soon.  She clarifies that she means that she is going to die soon.  Also, she had to deal with another person.  Had a conversation with her therapist-who wanted her to go to the hospital and not do nothing crazy.  Says that she has been very frustrated and mad as of late, and wanted to come in before she got really mad. Patient endorses command auditory hallucinations that tell her to kill herself.  These are ego-dystonic voices, as she does not want to kill herself.  Takes medication at home for these, which helps sometimes.  Endorses currently hearing the voices during this interview -- they are telling her not to speak with this clinical research associate.  Also is paranoid about the ghosts that she sees.  She believes the ghosts wish her harm. Reacted poorly when asked to give blood at the ED. Says that she got mad. Has a diagnosis of bipolar disorder.  Is on many medications and does not know what they are.  No previous history of suicide attempt.  No substance use.  Is  living by herself.  Receives disability.  Allowed clinical research associate to reach out to mother.  Collateral information via mother, Dickey Ada ([phone]): Patient has intellectual disability. Having problems with a girlfriend. Grandmother is sick. Her grandmother passed yesterday and mother is going to visit her to let her know tomorrow during visiting hours 7:00-7:30. She expects that this will go poorly and staff will need to be prepared for it. Lives alone but spends time at cousins house. Can discharge to cousins house. Three times this year has been to the psychiatric hospital.  Told mother that she wanted to kill herself. Says she was taking her medications. Was off her meds for a while -- sent them to the wrong address. Was off her meds for 1 month in early November/end of September. Unreliable with medications at baseline. Off birth control. Not good to go home. No firearms.   Past Psychiatric History: Current psychiatrist: Sees a psychiatrist.  Current therapist: [facility]  Previous psychiatric diagnoses: Schizoaffective disorder, bipolar type Psychiatric hospitalization(s): [facility]: 2024, 2016. Hitting walls, thoughts to jump off a roof.  Current medications: Gabapentin  300 mg at bedtime, Lamictal  150 mg BID, Olanzapine  20 mg QHS History of suicide (obtained from HPI): Reported no previous suicide attempts, has had suicidal ideation to jump off a roof before. Did admit to SA to [facility] NP 12/10 but did not go into detail. History of homicide or aggression (obtained in HPI): Recently required violent restraints and 5 security team members to ensure safety at [facility] before arrival here.   Substance Abuse  History:  Denied all. Mother confirmed.  Past Medical History: Unremarkable / Remarkable for  intellectual disability, T2DM, hyperlipidemia, vitamin D  deficiency, OSA, pre-diabetes, bilateral ACL tears.   Medications: Amlodipine 5 mg, lipitor 40 mg, metformin  500 mg BID.  Naproxen 250 mg TID with meals. No insulin  therapy for DM2 but checks every day Surgeries: Bilateral ACL tears, achilles heel tear Trauma: Denies Seizures: Denies Contraceptives: none currently  Social History: Living situation: Alone Education: IEP Occupational history: Disability  Access to firearms: Mother and patient deny  Family Psychiatric History:  Uncle heard voices  Family Medical History: None pertinent   Is the patient at risk to self? Yes.    Has the patient been a risk to self in the past 6 months? Yes.    Has the patient been a risk to self within the distant past? Yes.     Is the patient a risk to others? Yes.    Has the patient been a risk to others in the past 6 months? Yes.    Has the patient been a risk to others within the distant past? Yes.     Columbia Scale:  Flowsheet Row Ad[facility] (Current) [facility] 08[facility]11/2023 [facility] from 08/04/2023 in [facility] RISK CATEGORY No Risk No Risk No Risk     Tobacco Screening:  Tobacco Use History[1]  BH Tobacco Counseling     Are you interested in Tobacco Cessation Medications?  No, patient refused Counseled patient on smoking cessation:  Refused/Declined practical counseling Reason Tobacco Screening Not Completed: Patient Refused Screening    Allergies:   Allergies[2]  OBJECTIVE:  Physical Examination:  Physical Exam Constitutional:      General: She is not in acute distress.    Appearance: She is obese. She is not ill-appearing.  Pulmonary:     Effort: Pulmonary effort is normal. No respiratory distress.  Neurological:     Mental Status: She is alert.    Review of Systems  Constitutional:  Negative for chills and fever.  Gastrointestinal:  Negative for nausea and vomiting.  All other systems reviewed and are negative.  Blood pressure (!) 144/83, pulse 92,  temperature 98.1 F (36.7 C), temperature source Oral, resp. rate 16, height 6' (1.829 m), weight (!) 142.9 kg, SpO2 100%. Body mass index is 42.72 kg/m.  Lab Results:  - Metabolic profile and EKG monitoring obtained while on an atypical antipsychotic BMI: 42 TSH: Ordered Lipid panel: Orderred HbgA1c: Ordered QTc: 437  Metabolic disorder labs:  Lab Results  Component Value Date   HGBA1C 6.4 (H) 04/07/2023   MPG 136.98 04/07/2023   MPG 116.89 05/02/2022   No results found for: PROLACTIN Lab Results  Component Value Date   CHOL 123 04/07/2023   TRIG 104 04/07/2023   HDL 39 (L) 04/07/2023   CHOLHDL 3.2 04/07/2023   VLDL 21 04/07/2023   LDLCALC 63 04/07/2023   LDLCALC 96 01/06/2020    Results for orders placed or performed during the hospital encounter of 07/29/24 (from the past 48 hours)  Glucose, capillary     Status: None   Collection Time: 07/29/24  5:26 PM  Result Value Ref Range   Glucose-Capillary 96 70 - 99 mg/dL    Comment: Glucose reference range applies only to samples taken after fasting for at least 8 hours.  Glucose, capillary     Status: None   Collection  Time: 07/30/24  5:11 PM  Result Value Ref Range   Glucose-Capillary 95 70 - 99 mg/dL    Comment: Glucose reference range applies only to samples taken after fasting for at least 8 hours.    Blood alcohol level:  Lab Results  Component Value Date   [facility] <15 07/28/2024   ETH <10 06/13/2023    Current Medications: Current Facility-Administered Medications  Medication Dose Route Frequency Provider Last Rate Last Admin   amLODipine (NORVASC) tablet 5 mg  5 mg Oral Daily Rollene Katz, MD   5 mg at 07/30/24 1131   atorvastatin  (LIPITOR) tablet 40 mg  40 mg Oral QPM Rollene Katz, MD   40 mg at 07/30/24 1715   haloperidol  (HALDOL ) tablet 5 mg  5 mg Oral TID PRN Onuoha, Chinwendu V, NP   5 mg at 07/30/24 9173   And   diphenhydrAMINE  (BENADRYL ) capsule 50 mg  50 mg Oral TID PRN Onuoha, Chinwendu  V, NP   50 mg at 07/30/24 9173   haloperidol  lactate (HALDOL ) injection 5 mg  5 mg Intramuscular TID PRN Onuoha, Chinwendu V, NP       And   diphenhydrAMINE  (BENADRYL ) injection 50 mg  50 mg Intramuscular TID PRN Onuoha, Chinwendu V, NP       And   LORazepam  (ATIVAN ) injection 2 mg  2 mg Intramuscular TID PRN Onuoha, Chinwendu V, NP       haloperidol  lactate (HALDOL ) injection 10 mg  10 mg Intramuscular TID PRN Onuoha, Chinwendu V, NP       And   diphenhydrAMINE  (BENADRYL ) injection 50 mg  50 mg Intramuscular TID PRN Onuoha, Chinwendu V, NP       And   LORazepam  (ATIVAN ) injection 2 mg  2 mg Intramuscular TID PRN Onuoha, Chinwendu V, NP       gabapentin  (NEURONTIN ) capsule 300 mg  300 mg Oral QPM Rollene Katz, MD   300 mg at 07/30/24 1715   [START ON 07/31/2024] lamoTRIgine  (LAMICTAL ) tablet 50 mg  50 mg Oral Daily Rollene Katz, MD       NOREEN ON 07/31/2024] metFORMIN  (GLUCOPHAGE ) tablet 500 mg  500 mg Oral Q breakfast Rollene Katz, MD       naproxen (NAPROSYN) tablet 250 mg  250 mg Oral TID WC Rollene Katz, MD   250 mg at 07/30/24 1715   OLANZapine  (ZYPREXA ) tablet 20 mg  20 mg Oral QHS Rollene Katz, MD        PTA Medications: Medications Prior to Admission  Medication Sig Dispense Refill Last Dose/Taking   metFORMIN  (GLUCOPHAGE ) 500 MG tablet Take 500 mg by mouth daily with breakfast.   Taking   atorvastatin  (LIPITOR) 40 MG tablet Take 40 mg by mouth every evening.      baclofen  (LIORESAL ) 10 MG tablet Take 10 mg by mouth daily as needed (for headaches).      gabapentin  (NEURONTIN ) 300 MG capsule Take 300 mg by mouth every evening.      hydrOXYzine  (ATARAX ) 50 MG tablet Take 1 tablet (50 mg total) by mouth 2 (two) times daily as needed for anxiety.      lamoTRIgine  (LAMICTAL ) 150 MG tablet Take 150 mg by mouth 2 (two) times daily.      meloxicam  (MOBIC ) 15 MG tablet Take 1 tablet (15 mg total) by mouth daily. 30 tablet 0    OLANZapine  (ZYPREXA ) 20 MG  tablet Take 20 mg by mouth at bedtime.       Psychiatric Specialty Exam:  Mental Status Exam:  Appearance: obese black woman laying in bed and speaking to provider  Behavior: Cooperative  Attitude: Accommodating  Speech: simpler syntax, normal rate, rhythm, tone, volume  Mood: Mad  Affect: Euthymic, noncongruent  Thought Process: Linear, logical, goal directed  Thought Content: Endorsed paranoia surrounding visual hallucinations she sees  SI/HI: Endorses recent active suicidal ideation with plan, denies HI at present  Perceptions: CAH to kill self  Judgement: fair  Insight: fair  Fund of Knowledge: WNL   ASSESSMENT AND PLAN:  Kylah Maresh is a 33 year old female with a history of intellectual disability and schizoaffective disorder. She presented to the ED on the advice of her therapist. Social stressors include problem with girlfriend and thoughts of her grandmother's passing. Grandmother *has* passed away as of 08/13/24 but patient does not know. Mom will discuss it with her during visitation from 7 to 7:30.   Given patient's history of punching walls when upset, patient's home Olanzapine  20 mg will be scheduled for 6 PM going forward. May be prudent to withhold this news until patient is on a more stable psychiatrically, will reach out to mother again tomorrow.   Regarding patient's Lamictal : mom is unsure of patient's compliance as patient lives at home alone. Is currently on lamictal  150 mg BID Has been noncompliant with meds before. Will gather lamictal  level tomorrow AM, mainly to get a baseline as to patient's medication compliance and restart at a lower dose of 50 mg qday. If level returns adequate, may titrate upwards more rapidly.   # Schiozaffective disorder, bipolar type, current episode depressed -- Completed IVC 12/11 given safety concerns.  -- Continue home Olanzapine  20 mg qday -- Start home lamictal  at 50 mg qday. Ordered lamictal  serum labs.  #HTN -- Home  amlodipine 5 -- Given high BPs, may increase  #HLD -- Home atorvastatin  40  #Pain -- Gabapentin  300 mg at bedtime -- Scheduled naproxen   #T2DM -- Patient checks glucoses at home but does not take insulin . Glucoses WNL yesterday.  # Safety and Monitoring: - Involuntary admission to inpatient psychiatric unit for safety, stabilization and treatment. - Daily contact with patient to assess and evaluate symptoms and progress in treatment - Patient's case to be discussed in multi-disciplinary team meeting -  Observation Level : q15 minute checks -  Vital signs:  q12 hours -  Precautions: suicide, elopement, and assault  4. Discharge Planning:  - Estimated discharge date: 4-7 days - Social work and case management to assist with discharge planning and identification of hospital follow-up needs prior to discharge. - Discharge concerns: Need to establish a safety plan; medication compliance and effectiveness. - Discharge goals: Return home with outpatient referrals for mental health follow-up including medication management/psychotherapy.  - Encouraged patient to participate in unit milieu and in scheduled group therapies  - Short Term Goals: Ability to identify changes in lifestyle to reduce recurrence of condition will improve, Ability to verbalize feelings will improve, and Ability to disclose and discuss suicidal ideas - Long Term Goals: Improvement in symptoms so as ready for discharge  I certify that inpatient services furnished can reasonably be expected to improve the patient's condition.    NB: This note was created using a voice recognition software as a result there may be grammatical errors inadvertently enclosed that do not reflect the nature of this encounter.   Jenisse Vullo, MD, PGY-2, Psychiatry Residency  12/11/20256:46 PM       [1]  Social History Tobacco Use  Smoking Status Never  Smokeless Tobacco Never  [2] No Known Allergies

## 2024-07-31 ENCOUNTER — Encounter (HOSPITAL_COMMUNITY): Payer: Self-pay

## 2024-07-31 LAB — LIPID PANEL
Cholesterol: 180 mg/dL (ref 0–200)
HDL: 55 mg/dL (ref >40)
LDL Cholesterol: 96 mg/dL (ref 0–99)
Total CHOL/HDL Ratio: 3.3 ratio
Triglycerides: 145 mg/dL (ref <150)
VLDL: 29 mg/dL (ref 0–40)

## 2024-07-31 LAB — TSH: TSH: 1.52 u[IU]/mL (ref 0.350–4.500)

## 2024-07-31 LAB — HEMOGLOBIN A1C
Hgb A1c MFr Bld: 5.1 % (ref 4.8–5.6)
Mean Plasma Glucose: 99.67 mg/dL

## 2024-07-31 LAB — GLUCOSE, CAPILLARY: Glucose-Capillary: 109 mg/dL — ABNORMAL HIGH (ref 70–99)

## 2024-07-31 LAB — VITAMIN B12: Vitamin B-12: 435 pg/mL (ref 180–914)

## 2024-07-31 LAB — VITAMIN D 25 HYDROXY (VIT D DEFICIENCY, FRACTURES): Vit D, 25-Hydroxy: 36.73 ng/mL (ref 30–100)

## 2024-07-31 MED ORDER — PROPRANOLOL HCL 10 MG PO TABS
10.0000 mg | ORAL_TABLET | Freq: Three times a day (TID) | ORAL | Status: DC
  Administered 2024-07-31 – 2024-08-03 (×9): 10 mg via ORAL
  Filled 2024-07-31 (×9): qty 1

## 2024-07-31 MED ORDER — POLYETHYLENE GLYCOL 3350 17 G PO PACK
17.0000 g | PACK | Freq: Every day | ORAL | Status: DC
  Administered 2024-07-31 – 2024-08-03 (×3): 17 g via ORAL
  Filled 2024-07-31 (×4): qty 1

## 2024-07-31 NOTE — Plan of Care (Signed)
   Problem: Education: Goal: Emotional status will improve Outcome: Progressing Goal: Mental status will improve Outcome: Progressing Goal: Verbalization of understanding the information provided will improve Outcome: Progressing   Problem: Activity: Goal: Interest or engagement in activities will improve Outcome: Progressing

## 2024-07-31 NOTE — Group Note (Signed)
 Date:  07/31/2024 Time:  9:35 PM  Group Topic/Focus:  Wrap-Up Group:   The focus of this group is to help patients review their daily goal of treatment and discuss progress on daily workbooks.    Participation Level:  Minimal  Participation Quality:  Appropriate  Affect:  Angry, Defensive, Depressed, and Irritable  Cognitive:  Disorganized and Confused  Insight: Appropriate  Engagement in Group:  Limited  Modes of Intervention:  Discussion  Additional Comments:  Pt stated her goal for today was to focus on her treatment plan. Pt stated she accomplished her goal today. Pt stated she spoke with her doctor and with her social worker about her care today. Pt rated her overall day a 10 but it change to one after visitation for her mother. Pt was informed at visitation that her grandmother had passed today. Pt stated she did not feel to good about  herself tonight. Pt stated she took all medications provided today. Pt stated her appetite was poor today. Pt rated her sleep last night was pretty good. Pt stated the goal tonight was to get some rest. Pt stated she had no physical pain tonight. Pt admitted to dealing with visual hallucinations and auditory issues tonight. Pt nurse was updated on the situation. Pt denies thoughts of harming herself or others. Pt stated she would alert staff if anything changed.  Lonni Na 07/31/2024, 9:35 PM

## 2024-07-31 NOTE — [facility] Group Notes (Signed)
 Adult Psychoeducational Group Note  Date:  07/31/2024 Time:  1:28 PM  Group Topic/Focus: Physical wellness  Participation Level:  Did Not Attend  Participation Quality:    Affect:    Cognitive:    Insight:   Engagement in Group:    Modes of Intervention:    Additional Comments:    Phoenix Dresser O 07/31/2024, 1:28 PM

## 2024-07-31 NOTE — BH IP Treatment Plan (Signed)
 Interdisciplinary Treatment and Diagnostic Plan Update  07/31/2024 Time of Session: 11:00 AM Cindy Phillips MRN: [id number]  Principal Diagnosis: Bipolar affective disorder [facility])  Secondary Diagnoses: Principal Problem:   Bipolar affective disorder (HCC)   Current Medications:  Current Facility-Administered Medications  Medication Dose Route Frequency Provider Last Rate Last Admin   amLODipine (NORVASC) tablet 5 mg  5 mg Oral Daily Rollene Katz, MD   5 mg at 07/31/24 0820   atorvastatin  (LIPITOR) tablet 40 mg  40 mg Oral QPM Rollene Katz, MD   40 mg at 07/31/24 1703   haloperidol  (HALDOL ) tablet 5 mg  5 mg Oral TID PRN Onuoha, Chinwendu V, NP   5 mg at 07/30/24 9173   And   diphenhydrAMINE  (BENADRYL ) capsule 50 mg  50 mg Oral TID PRN Onuoha, Chinwendu V, NP   50 mg at 07/30/24 9173   haloperidol  lactate (HALDOL ) injection 5 mg  5 mg Intramuscular TID PRN Onuoha, Chinwendu V, NP       And   diphenhydrAMINE  (BENADRYL ) injection 50 mg  50 mg Intramuscular TID PRN Onuoha, Chinwendu V, NP       And   LORazepam  (ATIVAN ) injection 2 mg  2 mg Intramuscular TID PRN Onuoha, Chinwendu V, NP       haloperidol  lactate (HALDOL ) injection 10 mg  10 mg Intramuscular TID PRN Onuoha, Chinwendu V, NP       And   diphenhydrAMINE  (BENADRYL ) injection 50 mg  50 mg Intramuscular TID PRN Onuoha, Chinwendu V, NP       And   LORazepam  (ATIVAN ) injection 2 mg  2 mg Intramuscular TID PRN Onuoha, Chinwendu V, NP       gabapentin  (NEURONTIN ) capsule 300 mg  300 mg Oral QPM Rollene Katz, MD   300 mg at 07/31/24 1703   lamoTRIgine  (LAMICTAL ) tablet 50 mg  50 mg Oral Daily Rollene Katz, MD   50 mg at 07/31/24 0820   metFORMIN  (GLUCOPHAGE ) tablet 500 mg  500 mg Oral Q breakfast Rollene Katz, MD   500 mg at 07/31/24 0820   naproxen (NAPROSYN) tablet 250 mg  250 mg Oral TID WC Rollene Katz, MD   250 mg at 07/31/24 1703   OLANZapine  (ZYPREXA ) tablet 20 mg  20 mg Oral QHS Crawford,  Benjamin, MD   20 mg at 07/31/24 1703   polyethylene glycol (MIRALAX  / GLYCOLAX ) packet 17 g  17 g Oral Daily Rollene Katz, MD   17 g at 07/31/24 1155   propranolol (INDERAL) tablet 10 mg  10 mg Oral TID Rollene Katz, MD   10 mg at 07/31/24 1703   PTA Medications: Medications Prior to Admission  Medication Sig Dispense Refill Last Dose/Taking   metFORMIN  (GLUCOPHAGE ) 500 MG tablet Take 500 mg by mouth daily with breakfast.   Taking   atorvastatin  (LIPITOR) 40 MG tablet Take 40 mg by mouth every evening.      baclofen  (LIORESAL ) 10 MG tablet Take 10 mg by mouth daily as needed (for headaches).      gabapentin  (NEURONTIN ) 300 MG capsule Take 300 mg by mouth every evening.      hydrOXYzine  (ATARAX ) 50 MG tablet Take 1 tablet (50 mg total) by mouth 2 (two) times daily as needed for anxiety.      lamoTRIgine  (LAMICTAL ) 150 MG tablet Take 150 mg by mouth 2 (two) times daily.      meloxicam  (MOBIC ) 15 MG tablet Take 1 tablet (15 mg total) by mouth daily. 30 tablet 0  OLANZapine  (ZYPREXA ) 20 MG tablet Take 20 mg by mouth at bedtime.       Patient Stressors: Medication change or noncompliance    Patient Strengths: Supportive family/friends   Treatment Modalities: Medication Management, Group therapy, Case management,  1 to 1 session with clinician, Psychoeducation, Recreational therapy.   Physician Treatment Plan for Primary Diagnosis: Bipolar affective disorder (HCC) Long Term Goal(s):     Short Term Goals: Ability to identify changes in lifestyle to reduce recurrence of condition will improve Ability to verbalize feelings will improve Ability to disclose and discuss suicidal ideas  Medication Management: Evaluate patient's response, side effects, and tolerance of medication regimen.  Therapeutic Interventions: 1 to 1 sessions, Unit Group sessions and Medication administration.  Evaluation of Outcomes: Not Progressing  Physician Treatment Plan for Secondary Diagnosis:  Principal Problem:   Bipolar affective disorder (HCC)  Long Term Goal(s):     Short Term Goals: Ability to identify changes in lifestyle to reduce recurrence of condition will improve Ability to verbalize feelings will improve Ability to disclose and discuss suicidal ideas     Medication Management: Evaluate patient's response, side effects, and tolerance of medication regimen.  Therapeutic Interventions: 1 to 1 sessions, Unit Group sessions and Medication administration.  Evaluation of Outcomes: Not Progressing   RN Treatment Plan for Primary Diagnosis: Bipolar affective disorder (HCC) Long Term Goal(s): Knowledge of disease and therapeutic regimen to maintain health will improve  Short Term Goals: Ability to remain free from injury will improve, Ability to verbalize frustration and anger appropriately will improve, Ability to demonstrate self-control, Ability to participate in decision making will improve, Ability to verbalize feelings will improve, Ability to disclose and discuss suicidal ideas, Ability to identify and develop effective coping behaviors will improve, and Compliance with prescribed medications will improve  Medication Management: RN will administer medications as ordered by provider, will assess and evaluate patient's response and provide education to patient for prescribed medication. RN will report any adverse and/or side effects to prescribing provider.  Therapeutic Interventions: 1 on 1 counseling sessions, Psychoeducation, Medication administration, Evaluate responses to treatment, Monitor vital signs and CBGs as ordered, Perform/monitor CIWA, COWS, AIMS and Fall Risk screenings as ordered, Perform wound care treatments as ordered.  Evaluation of Outcomes: Not Progressing   LCSW Treatment Plan for Primary Diagnosis: Bipolar affective disorder (HCC) Long Term Goal(s): Safe transition to appropriate next level of care at discharge, Engage patient in therapeutic  group addressing interpersonal concerns.  Short Term Goals: Engage patient in aftercare planning with referrals and resources, Increase social support, Increase ability to appropriately verbalize feelings, Increase emotional regulation, Facilitate acceptance of mental health diagnosis and concerns, Facilitate patient progression through stages of change regarding substance use diagnoses and concerns, Identify triggers associated with mental health/substance abuse issues, and Increase skills for wellness and recovery  Therapeutic Interventions: Assess for all discharge needs, 1 to 1 time with Social worker, Explore available resources and support systems, Assess for adequacy in community support network, Educate family and significant other(s) on suicide prevention, Complete Psychosocial Assessment, Interpersonal group therapy.  Evaluation of Outcomes: Not Progressing   Progress in Treatment: Attending groups: No. Participating in groups: No. Taking medication as prescribed: Yes. Toleration medication: Yes. Family/Significant other contact made: No, will contact:  consents are pending Patient understands diagnosis: No. Discussing patient identified problems/goals with staff: No. Medical problems stabilized or resolved: Yes. Denies suicidal/homicidal ideation: Yes. Issues/concerns per patient self-inventory: No.  New problem(s) identified:  No  New Short Term/Long Term Goal(s):  medication stabilization, elimination of SI thoughts, development of comprehensive mental wellness plan.    Patient Goals:  I don't really have any goals.  Discharge Plan or Barriers:  Patient recently admitted. CSW will continue to follow and assess for appropriate referrals and possible discharge planning.    Reason for Continuation of Hospitalization: Hallucinations Medication stabilization Suicidal ideation  Estimated Length of Stay:  5 - 7 days  Last 3 [place] Suicide Severity Risk  Score: Flowsheet Row [facility] (Current) [facility]/11/2023 in [facility] UC from 08/04/2023 in [facility] RISK CATEGORY No Risk No Risk No Risk    Last PHQ 2/9 Scores:    04/10/2021    2:53 PM 01/21/2020    3:15 PM 11/26/2017    8:14 AM  Depression screen PHQ 2/9  Decreased Interest 0 0 0  Down, Depressed, Hopeless 1 2 2   PHQ - 2 Score 1 2 2   Altered sleeping 1 1 3   Tired, decreased energy 1 3 3   Change in appetite 1 1 2   Feeling bad or failure about yourself  0 0 0  Trouble concentrating 0 1 3  Moving slowly or fidgety/restless 0 0 2  Suicidal thoughts 0 0 3  PHQ-9 Score 4  8  18    Difficult doing work/chores Somewhat difficult Somewhat difficult Very difficult     Data saved with a previous flowsheet row definition    Scribe for Treatment Team: Kenadie Royce O Yena Tisby, LCSWA 07/31/2024 7:00 PM

## 2024-07-31 NOTE — Group Note (Signed)
 Recreation Therapy Group Note   Group Topic:Self-Esteem  Group Date: 07/31/2024 Start Time: 1030 End Time: 1046 Facilitators: Marea Reasner-McCall, LRT,CTRS Location: [address] Dayroom   Group Topic: Self-esteem  Goal Area(s) Addresses:  Patient will identify and write positive traits about themselves. Patient will successfully identify influential people in their life and why they admire them.  Behavioral Response:    Intervention: Personalized Plate- printed license plate template, markers or colored pencils   Activity: Patients were then instructed to design a personalized license plate, with words and drawings, representing at least 3 positive things about themselves. Pts were encouraged to include favorites, things they are proud of, what they enjoy doing, and dreams for their future. If a patient had a life motto or a meaningful phase that expressed their life values, pt's were asked to incorporate that into their design as well. Patients were given the opportunity to share their completed work with the group.  Education: Healthy self-esteem, Positive character traits, Accepting compliments, Leisure as competence and coping, Support Systems, Discharge planning   Education Outcome: Acknowledges education/In group clarification offered   Affect/Mood: N/A   Participation Level: Did not attend    Clinical Observations/Individualized Feedback:      Plan: Continue to engage patient in RT group sessions 2-3x/week.   Stefanos Haynesworth-McCall, LRT,CTRS 07/31/2024 12:25 PM

## 2024-07-31 NOTE — BHH Group Notes (Signed)
 Spirituality Group   Description: Participant directed exploration of values, beliefs and meaning   **Focus on Community & Connections/naming sources of support (especially as a response to despair, urge to self-harm). Reflect and locate spiritual aspects (eg, purpose, joy, meaning, sense of self) of connection.   **Group focused on self-compassion as additional supportive and healing method   Following a brief framework of chaplains role and ground rules of group behavior, participants are invited to share concerns or questions that engage spiritual life. Emphasis placed on common themes and shared experiences and ways to make meaning and clarify living into ones values.   Theory/Process/Goal: Utilize the theoretical framework of group therapy established by Celena Kite, Relational Cultural Theory and Rogerian approaches to facilitate relational empathy and use of the here and now to foster reflection, self-awareness, and sharing.   Observations: Ramiya was reserved and hesitant to engage. Did participate when encouraged and engaged directly. Feels hopeless and expressed concern about grandmother nearing EOL.  Glena Pharris L. Delores HERO.Div

## 2024-07-31 NOTE — BHH Group Notes (Signed)
 Adult Psychoeducational Group Note  Date:  07/31/2024 Time:  1:27 PM  Group Topic/Focus: Rec. Therapy Group  Participation Level:  Did Not Attend  Ronnell Puller 07/31/2024, 1:27 PM

## 2024-07-31 NOTE — Progress Notes (Addendum)
 [facility] Inpatient Psychiatry Progress Note  Date: 07/31/2024 Patient: Cindy Phillips MRN: [id number]   ASSESSMENT AND PLAN:   Maisley Hainsworth is a 33 year old female with a history of intellectual disability and schizoaffective disorder. She presented to the ED on the advice of her therapist for suicidal thoughts/irritation in the setting of command auditory hallucinations. Social stressors include problem with girlfriend and thoughts of her grandmother's passing.   Patient calm on assessment this morning. Continued command auditory hallucinations saying the same things. No SI, however, the voices are telling her to kill herself. Endorsed vague HI towards others in her life, but not to staff: That's why I'm here. Cause I might do something I don't want to do. OK with starting propranolol TID. Has shown promise in managing behavioral dysregulation ISO intellectual disability. Mood is all right, however, no sign of irritation on interview.  At home, patient has been taking medications irregularly, only when she feels she needs to. Educated patient regarding regular medication use. Labs did not get done this AM, will repeat. Will otherwise continue home medications as she likely has not been fully compliant. Noted constipation, last BM unknown. OK with miralax  powder.   Mother will visit today to inform patient of grandmother's death. Staff informed. Will schedule propranolol and olanzapine  1 hour before meeting. Feel as if it is in the greatest interest of the patient to be informed of this in the inpatient setting, where her threat to self and others is reduced. Will need to know when service for deceased grandmother will be held and weigh safety of discharge with importance of her attendance .   # Schiozaffective disorder, bipolar type, current episode depressed # Behavioral dysregulation/aggression ISO intellectual disability -- Completed IVC 12/11 given safety  concerns.  -- Continue home Olanzapine  20 mg qday -- Continue home lamictal  at 50 mg qday. Awaiting lamictal  serum labs. -- Start Propranolol 10 mg TID for behavioral dysregulation.   #HTN -- Home amlodipine 5 -- Likely has not been taking regularly, will consider increase on Sunday 12/14 after administration   #HLD -- Home atorvastatin  40 -- Lipids pending   #Pain -- Gabapentin  300 mg at bedtime -- Scheduled naproxen    #T2DM -- Patient checks glucoses at home but does not take insulin . Glucoses WNL yesterday. -- A1c Pending  #Constipation   Risk Assessment: Patient continues to require inpatient hospitalization for safety and stabilization of behavioral dysregulation, suicidal ideation .  Discharge Planning: Barriers to Discharge: observation and medication adjustment, command hallucinations ongoing Estimated Length of Stay: 5-7 days Predicted Discharge Location: Home   INTERVAL HISTORY: HR 94. BP 141/85. Slept 10.5 hours. No concerns overnight. Denied SI and HI. Endorsed AH. Refused labs. Will re-attempt this afternoon.  Patient is all right. Was having a nightmare when interviewer woke her up. Still hearing off and on AH telling her to kill herself. Does not want to die.  Vague HI towards others in her life, but not to staff. Good judgment: That's why I'm in here. Does not want to hurt anyone.  Has only been taking medications when I need it, educated patient.  Denied CP or shortness of breath. Endorsed stomach discomfort, likely constipation. OK with miralax  packet.  Says VH are wearing off.   Physical Examination:  Vitals and nursing note reviewed MSK: Normal gait and station  MENTAL STATUS EXAM:  Appearance: obese black woman laying in bed and speaking to provider  Behavior: Cooperative  Attitude: Accommodating  Speech: simpler  syntax, normal rate, rhythm, tone, volume  Mood: OK  Affect: Euthymic, brightens occasionally  Thought Process:  Linear, logical, goal directed  Thought Content: Endorsed paranoia surrounding visual hallucinations she sees  SI/HI: Vague HI towards others, no plan. No suicidal intent but CAH to kill self unchanged.   Perceptions: CAH to kill self  Judgement: fair  Insight: fair  Fund of Knowledge: WNL     Lab Results:  Admission on 07/29/2024  Component Date Value Ref Range Status   Glucose-Capillary 07/29/2024 96  70 - 99 mg/dL Final   Glucose-Capillary 07/30/2024 95  70 - 99 mg/dL Final   Glucose-Capillary 07/31/2024 109 (H)  70 - 99 mg/dL Final     Vitals: Blood pressure (!) 141/85, pulse 94, temperature 97.8 F (36.6 C), temperature source Oral, resp. rate 16, height 6' (1.829 m), weight (!) 142.9 kg, SpO2 99%.    Odis Cleveland PGY-2, Psychiatry Residency  07/31/2024, 10:08 AM

## 2024-07-31 NOTE — Plan of Care (Signed)
   Problem: Education: Goal: Emotional status will improve Outcome: Progressing Goal: Mental status will improve Outcome: Progressing Goal: Verbalization of understanding the information provided will improve Outcome: Progressing

## 2024-07-31 NOTE — BHH Group Notes (Signed)
 Adult Psychoeducational Group Note  Date:  07/31/2024 Time:  1:28 PM  Group Topic/Focus:  Goals Group:   The focus of this group is to help patients establish daily goals to achieve during treatment and discuss how the patient can incorporate goal setting into their daily lives to aide in recovery. Orientation:   The focus of this group is to educate the patient on the purpose and policies of crisis stabilization and provide a format to answer questions about their admission.  The group details unit policies and expectations of patients while admitted.  Participation Level:  Did Not Attend  Participation Quality:    Affect:    Cognitive:    Insight:   Engagement in Group:    Modes of Intervention:    Additional Comments:    Caryl Fate O 07/31/2024, 1:28 PM

## 2024-07-31 NOTE — BHH Group Notes (Signed)
 Adult Psychoeducational Group Note  Date:  07/31/2024 Time:  3:51 PM  Group Topic/Focus: RN Group Social Wellness  Participation Level:  Did Not Attend  Participation Quality:    Affect:    Cognitive:    Insight:   Engagement in Group:    Modes of Intervention:    Additional Comments:    Jamelle Cassondra KIDD 07/31/2024, 3:51 PM

## 2024-07-31 NOTE — BHH Group Notes (Signed)
 Adult Psychoeducational Group Note  Date:  07/31/2024 Time:  1:49 PM  Group Topic/Focus: Spiritual Wellness group  Participation Level:  Active  Participation Quality:  Attentive  Affect:  Appropriate  Cognitive:  Appropriate  Insight: Appropriate  Engagement in Group:  Developing/Improving  Modes of Intervention:  Activity and Clarification  Additional Comments:    Ronnell Puller 07/31/2024, 1:49 PM

## 2024-08-01 LAB — GLUCOSE, CAPILLARY
Glucose-Capillary: 110 mg/dL — ABNORMAL HIGH (ref 70–99)
Glucose-Capillary: 99 mg/dL (ref 70–99)

## 2024-08-01 NOTE — Group Note (Signed)
 LCSW Group Therapy Note   Group Date: 08/01/2024 Start Time: 1035 End Time: 1135   Type of Therapy and Topic:  Group Therapy: Boundaries  Participation Level:  Active  Description of Group: This group will address the use of boundaries in their personal lives. Patients will explore why boundaries are important, the difference between healthy and unhealthy boundaries, and negative and postive outcomes of different boundaries and will look at how boundaries can be crossed.  Patients will be encouraged to identify current boundaries in their own lives and identify what kind of boundary is being set. Facilitators will guide patients in utilizing problem-solving interventions to address and correct types boundaries being used and to address when no boundary is being used. Understanding and applying boundaries will be explored and addressed for obtaining and maintaining a balanced life. Patients will be encouraged to explore ways to assertively make their boundaries and needs known to significant others in their lives, using other group members and facilitator for role play, support, and feedback.  Therapeutic Goals:  1.  Patient will identify areas in their life where setting clear boundaries could be used to improve their life.  2.  Patient will identify signs/triggers that a boundary is not being respected. 3.  Patient will identify two ways to set boundaries in order to achieve balance in their lives: 4.  Patient will demonstrate ability to communicate their needs and set boundaries  through discussion and/or role plays  Summary of Patient Progress:  Lorraine was active throughout the session and proved open to feedback from CSW and peers. Patient demonstrated good insight into the subject matter, was respectful of peers, and was present throughout the entire session.  Therapeutic Modalities:   Cognitive Behavioral Therapy Solution-Focused Therapy  Hunter JONELLE Alto ISRAEL 08/01/2024  2:23 PM

## 2024-08-01 NOTE — Group Note (Deleted)
 LCSW Group Therapy Note   Group Date: 08/01/2024 Start Time: 1000 End Time: 1045   Type of Therapy and Topic:  Group Therapy: Boundaries  Participation Level:  {BHH PARTICIPATION OZCZO:[id number]}  Description of Group: This group will address the use of boundaries in their personal lives. Patients will explore why boundaries are important, the difference between healthy and unhealthy boundaries, and negative and postive outcomes of different boundaries and will look at how boundaries can be crossed.  Patients will be encouraged to identify current boundaries in their own lives and identify what kind of boundary is being set. Facilitators will guide patients in utilizing problem-solving interventions to address and correct types boundaries being used and to address when no boundary is being used. Understanding and applying boundaries will be explored and addressed for obtaining and maintaining a balanced life. Patients will be encouraged to explore ways to assertively make their boundaries and needs known to significant others in their lives, using other group members and facilitator for role play, support, and feedback.  Therapeutic Goals:  1.  Patient will identify areas in their life where setting clear boundaries could be  used to improve their life.  2.  Patient will identify signs/triggers that a boundary is not being respected. 3.  Patient will identify two ways to set boundaries in order to achieve balance in  their lives: 4.  Patient will demonstrate ability to communicate their needs and set boundaries  through discussion and/or role plays  Summary of Patient Progress:  *** was ***present/active throughout the session and proved open to feedback from CSW and peers. Patient demonstrated *** insight into the subject matter, was respectful of peers, and was present throughout the entire session.  Therapeutic Modalities:   Cognitive Behavioral Therapy Solution-Focused Therapy  Camelia Olden, ISRAEL 08/01/2024  11:31 AM

## 2024-08-01 NOTE — BHH Group Notes (Signed)
 Adult Psychoeducational Group Note  Date:  08/01/2024 Time:  3:28 PM  Group Topic/Focus: EmotionalWellness Wellness Toolbox:   The focus of this group is to discuss various aspects of wellness, balancing those aspects and exploring ways to increase the ability to experience wellness.  Patients will create a wellness toolbox for use upon discharge.  Participation Level:  Did Not Attend  Participation Quality:    Affect:    Cognitive:    Insight:   Engagement in Group:    Modes of Intervention:    Additional Comments:    Annett Berle Hoyer 08/01/2024, 3:28 PM

## 2024-08-01 NOTE — Progress Notes (Signed)
°   07/31/24 1940  Psych Admission Type (Psych Patients Only)  Admission Status Voluntary  Psychosocial Assessment  Patient Complaints Anxiety;Depression;Self-harm thoughts;Agitation  Eye Contact Fair  Facial Expression Worried  Affect Anxious  Speech Logical/coherent  Interaction Assertive  Motor Activity Other (Comment) (WDL)  Appearance/Hygiene Unremarkable  Behavior Characteristics Cooperative  Mood Anxious;Preoccupied  Thought Process  Coherency Circumstantial  Content WDL  Delusions None reported or observed  Perception Hallucinations  Hallucination Auditory  Judgment Impaired  Confusion None  Danger to Self  Current suicidal ideation? Passive  Self-Injurious Behavior Some self-injurious ideation observed or expressed.  No lethal plan expressed   Agreement Not to Harm Self Yes  Description of Agreement Verbal  Danger to Others  Danger to Others None reported or observed

## 2024-08-01 NOTE — Plan of Care (Signed)
   Problem: Education: Goal: Knowledge of [facility] Education information/materials will improve Outcome: Progressing Goal: Emotional status will improve Outcome: Progressing Goal: Mental status will improve Outcome: Progressing Goal: Verbalization of understanding the information provided will improve Outcome: Progressing

## 2024-08-01 NOTE — [facility] Group Notes (Signed)
 Adult Psychoeducational Group Note  Date:  08/01/2024 Time:  11:44 AM  Group Topic/Focus: Goals Group Goals Group:   The focus of this group is to help patients establish daily goals to achieve during treatment and discuss how the patient can incorporate goal setting into their daily lives to aide in recovery.  Participation Level:  Did Not Attend  Participation Quality:    Affect:    Cognitive:    Insight:   Engagement in Group:    Modes of Intervention:    Additional Comments:    Annett Berle Hoyer 08/01/2024, 11:44 AM

## 2024-08-01 NOTE — Progress Notes (Signed)
 Patient is pleasant this shift and interacting with Peers and Staff visible in Milieu support provided as needed. Q 15 minutes safety checks ongoing. Endorses depression and sadness related to grand mother passing. Denies SI/HI/A/VH and verbally contracts for safety. No medications adverse effects noted.

## 2024-08-01 NOTE — [facility] Group Notes (Signed)
 Adult Psychoeducational Group Note  Date:  08/01/2024 Time:  12:47 PM  Group Topic/Focus: Intellectual Wellness Developing a Wellness Toolbox:   The focus of this group is to help patients develop a wellness toolbox with skills and strategies to promote recovery upon discharge.  Participation Level:  Did Not Attend  Participation Quality:    Affect:    Cognitive:    Insight:   Engagement in Group:    Modes of Intervention:    Additional Comments:    Annett Berle Hoyer 08/01/2024, 12:47 PM

## 2024-08-01 NOTE — Group Note (Signed)
 Date:  08/01/2024 Time:  8:45 PM  Group Topic/Focus:  Wrap-Up Group:   The focus of this group is to help patients review their daily goal of treatment and discuss progress on daily workbooks.    Participation Level:  Active  Participation Quality:  Appropriate  Affect:  Appropriate  Cognitive:  Appropriate  Insight: Appropriate  Engagement in Group:  Developing/Improving  Modes of Intervention:  Discussion  Additional Comments:  Pt stated her goal for today was to focus on her treatment plan. Pt stated she accomplished her goal today. Pt stated she talked with her doctor and with her social worker about her care today. Pt rated her overall day a 6 out of 10. Pt stated she was able to contact her mother today which improved her overall day. Pt stated she felt better about herself tonight. Pt stated she was able to attend all meals today. Pt stated she took all medications provided today. Pt stated her appetite was pretty good today. Pt rated her sleep last night was fair. Pt stated the goal tonight was to get some rest. Pt stated she had no physical pain tonight. Pt deny visual hallucinations and auditory issues tonight. Pt denies thoughts of harming herself or others. Pt stated she would alert staff if anything changed.  Lonni Na 08/01/2024, 8:45 PM

## 2024-08-01 NOTE — Progress Notes (Signed)
(  Sleep Hours) - 10 (Any PRNs that were needed, meds refused, or side effects to meds)- PRN haldol  5 mg and benadryl  50 mg PO per mild agitation protocol.  (Any disturbances and when (visitation, over night)- None  (Concerns raised by the patient)- Pt expressed anxiety, agitation, and sadness following visitation, where she was informed that her grandmother had passed away.  (SI/HI/AVH)- Endorses passive suicidal ideation with no plan, verbally contracts for safety. Denies HI. Endorses auditory hallucinations, not observed by this clinical research associate to be responding to internal stimuli.

## 2024-08-01 NOTE — Progress Notes (Signed)
 [facility] Inpatient Psychiatry Progress Note  Date: 08/01/2024 Patient: Cindy Phillips MRN: [id number]   ASSESSMENT AND PLAN:   Cindy Phillips is a 33 year old female with a history of intellectual disability and schizoaffective disorder. She presented to the ED on the advice of her therapist for suicidal thoughts/irritation in the setting of command auditory hallucinations. Social stressors include problem with girlfriend and thoughts of her grandmother's passing.   12/13: Patient calm on assessment this morning.  She reports that she is extremely sad about the news with her grandmother passing. she reported lessening of hallucinations.  Denies command hallucinations this morning. Denies SI/HI this morning.  Doing well on propranolol  TID. Has shown promise in managing behavioral dysregulation ISO intellectual disability.  Patient beginning to think about discharge  Reports: Sleep: fair Appetite: good, but does not like the food Depression: very high 8/9 but reports this is in the setting of grief about her grandmother Anxiety: low 3 or 4 / 10 Auditory Hallucinations: denies, not seen responding Visual Hallucinations: denies, not seen responding Paranoia: denies Delusions: denies SI: denies active or passive HI: denies active or passive   # Schiozaffective disorder, bipolar type, current episode depressed # Behavioral dysregulation/aggression ISO intellectual disability -- Completed IVC 12/11 given safety concerns.  -- Continue home Olanzapine  20 mg qday -- Continue home lamictal  at 50 mg qday. Awaiting lamictal  serum labs. -- Start Propranolol  10 mg TID for behavioral dysregulation.   #HTN -- Home amlodipine  5 -- Likely has not been taking regularly, will consider increase on Sunday 12/14 after administration   #HLD -- Home atorvastatin  40 -- Lipids from 12/12 all within normal limits.    #Pain -- Gabapentin  300 mg at bedtime -- Scheduled  naproxen     #T2DM -- Patient checks glucoses at home but does not take insulin . Glucoses WNL yesterday. -- A1c 5.1 on 12/12  #Constipation   Risk Assessment: Patient continues to require inpatient hospitalization for safety and stabilization of behavioral dysregulation, suicidal ideation .  Discharge Planning: Barriers to Discharge: observation and medication adjustment, command hallucinations ongoing Estimated Length of Stay: 5-7 days Predicted Discharge Location: Home   INTERVAL HISTORY: HR 80. BP 136/82. Sleep Hours) - 10 (Any PRNs that were needed, meds refused, or side effects to meds)- PRN haldol  5 mg and benadryl  50 mg PO per mild agitation protocol.  (Any disturbances and when (visitation, over night)- None  (Concerns raised by the patient)- Pt expressed anxiety, agitation, and sadness following visitation, where she was informed that her grandmother had passed away.  (SI/HI/AVH)- Endorses passive suicidal ideation with no plan, verbally contracts for safety. Denies HI. Endorses auditory hallucinations, not observed by this writer to be responding to internal stimuli  Patient is all right. Was having a nightmare when interviewer woke her up. Still hearing off and on AH telling her to kill herself. Does not want to die.  Vague HI towards others in her life, but not to staff. Good judgment: That's why I'm in here. Does not want to hurt anyone.  Has only been taking medications when I need it, educated patient.  Denied CP or shortness of breath. Endorsed stomach discomfort, likely constipation. OK with miralax  packet.  Says VH are wearing off.   Physical Examination:  Vitals and nursing note reviewed MSK: Normal gait and station  MENTAL STATUS EXAM:  Appearance: obese black woman sitting upright on bench, calmly eating popcorn while she talks to provider  Behavior: Cooperative  Attitude: Accommodating  Speech:  simpler syntax, normal rate, rhythm, tone, volume   Mood: Really sad  Affect: Dysphoric with reactive mood  Thought Process: Linear, logical, goal directed  Thought Content: Denied paranoia this morning  SI/HI: Denied active or passive SI or HI this morning  Perceptions: denies, not seen responding  Judgement: fair  Insight: fair  Fund of Knowledge: WNL     Lab Results:  Admission on 07/29/2024  Component Date Value Ref Range Status   Glucose-Capillary 07/29/2024 96  70 - 99 mg/dL Final   Glucose-Capillary 07/30/2024 95  70 - 99 mg/dL Final   Hgb J8r MFr Bld 07/31/2024 5.1  4.8 - 5.6 % Final   Mean Plasma Glucose 07/31/2024 99.67  mg/dL Final   TSH 87/87/7974 1.520  0.350 - 4.500 uIU/mL Final   Cholesterol 07/31/2024 180  0 - 200 mg/dL Final   Triglycerides 87/87/7974 145  <150 mg/dL Final   HDL 87/87/7974 55  >40 mg/dL Final   Total CHOL/HDL Ratio 07/31/2024 3.3  RATIO Final   VLDL 07/31/2024 29  0 - 40 mg/dL Final   LDL Cholesterol 07/31/2024 96  0 - 99 mg/dL Final   Vit D, 74-Ybimnkb 07/31/2024 36.73  30 - 100 ng/mL Final   Vitamin B-12 07/31/2024 435  180 - 914 pg/mL Final   Glucose-Capillary 07/31/2024 109 (H)  70 - 99 mg/dL Final   Glucose-Capillary 08/01/2024 110 (H)  70 - 99 mg/dL Final   Glucose-Capillary 08/01/2024 99  70 - 99 mg/dL Final     Vitals: Blood pressure (!) 131/90, pulse 78, temperature 98.3 F (36.8 C), temperature source Oral, resp. rate 16, height 6' (1.829 m), weight (!) 142.9 kg, SpO2 99%.   Signed: JINNY Morene GORMAN Delsie, MD [facility] Physician, PGY-2 08/01/2024 1:41 PM

## 2024-08-02 LAB — LAMOTRIGINE LEVEL: Lamotrigine Lvl: 3.2 ug/mL (ref 2.0–20.0)

## 2024-08-02 LAB — GLUCOSE, CAPILLARY: Glucose-Capillary: 119 mg/dL — ABNORMAL HIGH (ref 70–99)

## 2024-08-02 NOTE — Progress Notes (Signed)
 [facility] Inpatient Psychiatry Progress Note  Date: 08/02/2024 Patient: Cindy Phillips MRN: [id number]   ASSESSMENT AND PLAN:   Ayari Liwanag is a 33 year old female with a history of intellectual disability and schizoaffective disorder. She presented to the ED on the advice of her therapist for suicidal thoughts/irritation in the setting of command auditory hallucinations. Social stressors include problem with girlfriend and thoughts of her grandmother's passing.   12/14 Patient believes she is doing much better than she came in, although still expresses some tactile hallucinations (I can feel other people's bodies on me). No command auditory hallucinations to hurt self. No desire to hurt others and no worry that she will hurt other people. Denied suicidal ideation for 48 hours. I believe that patient is approaching baseline -- she has been nothing but very kind on the unit and more than capable of keeping control of her behavior, even when she was told that her grandmother had passed. She will be discharged in care of her cousin, who will keep an eye on her over the next few days during this very sensitive time.  # Schiozaffective disorder, bipolar type, current episode depressed # Behavioral dysregulation/aggression ISO intellectual disability -- Completed IVC 12/11 given safety concerns.  -- Continue home Olanzapine  20 mg qday -- Continue home lamictal  at 50 mg qday. Awaiting lamictal  serum labs. -- Continue  Propranolol  10 mg TID for behavioral dysregulation.   #HTN -- Home amlodipine  5 -- Likely has not been taking regularly, will consider increase on Sunday 12/14 after administration   #HLD -- Home atorvastatin  40 -- Lipids from 12/12 all within normal limits.    #Pain -- Gabapentin  300 mg at bedtime -- Scheduled naproxen     #T2DM -- Patient checks glucoses at home but does not take insulin . Glucoses WNL yesterday. -- A1c 5.1 on  12/12  #Constipation   Risk Assessment: Patient continues to require inpatient hospitalization for safety and stabilization of behavioral dysregulation, suicidal ideation .  Discharge Planning: Barriers to Discharge: observation and medication adjustment, command hallucinations ongoing Estimated Length of Stay: 5-7 days Predicted Discharge Location: Home   INTERVAL HISTORY: HR 86, 125/87 No medication refusals. No PRNs. Slept 8 hours. No concerns. Denied SI/HI/AVH. No command hallucinations over last 24 hours.   Although sad that her grandmother has passed, patient feels much better since coming into the hospital. Grandmother's service tomorrow. Denied hearing command auditory hallucination but said that I can feel other people's bodies on me. Denied SI and HI otherwise. Still somewhat afraid that she will act out, but would like to be discharged soon. Amenable with discharge tomorrow. Gave permission to call mother. Later said she does not have any concerns about hurting other people.   Per mother and cousin Priscilla Blush [phone]: patient can return to cousin's place in AM of 12/15. No large stashes of medications. Believes patient. Will pick her up 9 or 9:30 tomorrow. Mother is somewhat wary of letting her come to the service since it will be a very emotional moment tomorrow, but if she want's to do it, I'm going to let her do it.  Physical Examination:  Vitals and nursing note reviewed MSK: Normal gait and station  MENTAL STATUS EXAM:  Appearance: obese black woman sitting upright on bench, calmly laying in bed  Behavior: Cooperative  Attitude: Accommodating  Speech: simpler syntax, normal rate, rhythm, tone, volume  Mood: Not doing too good  Affect: Dysphoric with reactive mood  Thought Process: Linear,  logical, goal directed  Thought Content: Denied paranoia this morning  SI/HI: Denied active or passive SI or HI this morning  Perceptions: denies, not seen  responding  Judgement: fair  Insight: fair  Fund of Knowledge: WNL     Lab Results:  Admission on 07/29/2024  Component Date Value Ref Range Status   Glucose-Capillary 07/29/2024 96  70 - 99 mg/dL Final   Glucose-Capillary 07/30/2024 95  70 - 99 mg/dL Final   Hgb J8r MFr Bld 07/31/2024 5.1  4.8 - 5.6 % Final   Mean Plasma Glucose 07/31/2024 99.67  mg/dL Final   TSH 87/87/7974 1.520  0.350 - 4.500 uIU/mL Final   Cholesterol 07/31/2024 180  0 - 200 mg/dL Final   Triglycerides 87/87/7974 145  <150 mg/dL Final   HDL 87/87/7974 55  >40 mg/dL Final   Total CHOL/HDL Ratio 07/31/2024 3.3  RATIO Final   VLDL 07/31/2024 29  0 - 40 mg/dL Final   LDL Cholesterol 07/31/2024 96  0 - 99 mg/dL Final   Vit D, 74-Ybimnkb 07/31/2024 36.73  30 - 100 ng/mL Final   Vitamin B-12 07/31/2024 435  180 - 914 pg/mL Final   Glucose-Capillary 07/31/2024 109 (H)  70 - 99 mg/dL Final   Glucose-Capillary 08/01/2024 110 (H)  70 - 99 mg/dL Final   Glucose-Capillary 08/01/2024 99  70 - 99 mg/dL Final   Glucose-Capillary 08/02/2024 119 (H)  70 - 99 mg/dL Final     Vitals: Blood pressure 131/82, pulse 66, temperature 98 F (36.7 C), temperature source Oral, resp. rate 16, height 6' (1.829 m), weight (!) 142.9 kg, SpO2 99%.   Signed: Morene Cleveland, MD [facility] Physician, PGY-2 08/02/2024 6:01 PM

## 2024-08-02 NOTE — Plan of Care (Signed)
   Problem: Education: Goal: Knowledge of [facility] Education information/materials will improve Outcome: Progressing Goal: Emotional status will improve Outcome: Progressing Goal: Mental status will improve Outcome: Progressing Goal: Verbalization of understanding the information provided will improve Outcome: Progressing

## 2024-08-02 NOTE — Plan of Care (Signed)
   Problem: Education: Goal: Knowledge of [facility] Education information/materials will improve Outcome: Progressing   Problem: Activity: Goal: Interest or engagement in activities will improve Outcome: Progressing   Problem: Coping: Goal: Ability to verbalize frustrations and anger appropriately will improve Outcome: Progressing   Problem: Safety: Goal: Periods of time without injury will increase Outcome: Progressing

## 2024-08-02 NOTE — Progress Notes (Signed)
 Patient denies SI, HI, AVH. Patient stated they slept Good last night. Scored zero on anxiety and depression. Patient has been calm, cooperative, and med compliant.      08/02/24 1000  Psych Admission Type (Psych Patients Only)  Admission Status Voluntary  Psychosocial Assessment  Patient Complaints None  Eye Contact Fair  Facial Expression Animated  Affect Appropriate to circumstance  Speech Logical/coherent  Interaction Assertive  Motor Activity Other (Comment) (WDL)  Appearance/Hygiene Unremarkable  Behavior Characteristics Cooperative;Appropriate to situation;Calm  Mood Pleasant;Euthymic  Thought Process  Coherency Circumstantial  Content WDL  Delusions None reported or observed  Perception WDL  Hallucination None reported or observed  Judgment Poor  Confusion None  Danger to Self  Current suicidal ideation? Denies  Self-Injurious Behavior No self-injurious ideation or behavior indicators observed or expressed   Agreement Not to Harm Self Yes  Description of Agreement Verbal  Danger to Others  Danger to Others None reported or observed

## 2024-08-02 NOTE — Group Note (Signed)
 Date:  08/02/2024 Time:  10:00 AM  Group Topic/Focus:  Breathing exercises     Participation Level:  Did Not Attend

## 2024-08-02 NOTE — Progress Notes (Signed)
(  Sleep Hours) - 8 (Any PRNs that were needed, meds refused, or side effects to meds)- none (Any disturbances and when (visitation, over night)- n/a (Concerns raised by the patient)- none (SI/HI/AVH)- Denies

## 2024-08-02 NOTE — Group Note (Signed)
 Date:  08/02/2024 Time:  8:44 PM  Group Topic/Focus:  Wrap-Up Group:   The focus of this group is to help patients review their daily goal of treatment and discuss progress on daily workbooks.    Participation Level:  Active  Participation Quality:  Appropriate  Affect:  Appropriate  Cognitive:  Appropriate  Insight: Appropriate  Engagement in Group:  Engaged  Modes of Intervention:  Education and Exploration  Additional Comments:  Patient attended and participated in group tonight.  She reports that today was OK.   She spoke with her mother.  She is schedule to leave tomorrow.  Gwenn Chillington Dacosta 08/02/2024, 8:44 PM

## 2024-08-02 NOTE — Group Note (Signed)
 Date:  08/02/2024 Time:  9:30 AM  Group Topic/Focus:  Goals Group:   The focus of this group is to help patients establish daily goals to achieve during treatment and discuss how the patient can incorporate goal setting into their daily lives to aide in recovery. Orientation:   The focus of this group is to educate the patient on the purpose and policies of crisis stabilization and provide a format to answer questions about their admission.  The group details unit policies and expectations of patients while admitted.    Participation Level:  Minimal  Participation Quality:  Resistant  Affect:  Appropriate  Cognitive:  Appropriate  Insight: Appropriate  Engagement in Group:  Engaged  Modes of Intervention:  Discussion and Orientation  Additional Comments:    Cindy Phillips 08/02/2024, 9:30 AM

## 2024-08-03 DIAGNOSIS — F319 Bipolar disorder, unspecified: Secondary | ICD-10-CM

## 2024-08-03 LAB — GLUCOSE, CAPILLARY: Glucose-Capillary: 123 mg/dL — ABNORMAL HIGH (ref 70–99)

## 2024-08-03 MED ORDER — LAMOTRIGINE 25 MG PO TABS: 50.0000 mg | ORAL_TABLET | Freq: Every day | ORAL | 0 refills | Status: AC

## 2024-08-03 MED ORDER — OLANZAPINE 20 MG PO TABS: 20.0000 mg | ORAL_TABLET | Freq: Every day | ORAL | 0 refills | Status: AC

## 2024-08-03 MED ORDER — METFORMIN HCL 500 MG PO TABS: 500.0000 mg | ORAL_TABLET | Freq: Every day | ORAL | 0 refills | Status: AC

## 2024-08-03 MED ORDER — AMLODIPINE BESYLATE 5 MG PO TABS: 5.0000 mg | ORAL_TABLET | Freq: Every day | ORAL | 0 refills | Status: AC

## 2024-08-03 MED ORDER — PROPRANOLOL HCL 10 MG PO TABS: 10.0000 mg | ORAL_TABLET | Freq: Three times a day (TID) | ORAL | 0 refills | Status: AC

## 2024-08-03 NOTE — Progress Notes (Signed)
°  [facility] Adult Case Management Discharge Plan :  Will you be returning to the same living situation after discharge:  Yes,  Pt will return home with cousin Priscilla Blush [phone]. At discharge, do you have transportation home?: Yes,  Pt will be picked up by Priscilla Blush [phone]. Do you have the ability to pay for your medications: Yes,  Pt has medical insurance.  Release of information consent forms completed and in the chart;  Patient's signature needed at discharge.  Patient to Follow up at:  Follow-up Information     [facility] Schedule an appointment as soon as possible for a visit.   Specialty: Professional Counselor Contact information: [address] [place] [address] [phone]         [facility] Schedule an appointment as soon as possible for a visit.   Contact information: [address] [place] [address] [phone]                 Next level of care provider has access to [facility] Link:no  Safety Planning and Suicide Prevention discussed: Yes,  Completed with mother and cousin, Priscilla Blush [phone].     Has patient been referred to the Quitline?: Patient does not use tobacco/nicotine products  Patient has been referred for addiction treatment: No known substance use disorder.  Derick JONELLE Blanch, LCSW 08/03/2024, 8:07 AM

## 2024-08-03 NOTE — Progress Notes (Signed)
(  Sleep Hours) -12.5 (Any PRNs that were needed, meds refused, or side effects to meds)- none (Any disturbances and when (visitation, over night)- n/a (Concerns raised by the patient)- none (SI/HI/AVH)- Denies

## 2024-08-03 NOTE — Discharge Summary (Signed)
 Physician Discharge Summary Note  Patient:  Cindy Phillips is an 33 y.o., female MRN:  [id number] DOB:  [birth year] Patient phone:  [phone] (home)  Patient address:   [address] [place] [address],   Date of Admission:  07/29/2024 Date of Discharge: 10/25  Reason for Admission:  CAH of self-harm and behavioral outbursts  Principal Problem: Bipolar affective disorder [facility]) Discharge Diagnoses: Principal Problem:   Bipolar affective disorder [facility])   Past Psychiatric History:   Current psychiatrist: Sees a psychiatrist.  Current therapist: [facility]  Previous psychiatric diagnoses: Schizoaffective disorder, bipolar type Psychiatric hospitalization(s): [facility]: 2024, 2016. Hitting walls, thoughts to jump off a roof.  Current medications: Gabapentin  300 mg at bedtime, Lamictal  150 mg BID, Olanzapine  20 mg QHS History of suicide (obtained from HPI): Reported no previous suicide attempts, has had suicidal ideation to jump off a roof before. Did admit to SA to [facility] NP 12/10 but did not go into detail. History of homicide or aggression (obtained in HPI): Recently required violent restraints and 5 security team members to ensure safety at [facility] before arrival here.   Past Medical History:  Past Medical History:  Diagnosis Date   Arthritis    Bipolar 1 disorder (HCC)    Bowel incontinence    Depression    Diabetes mellitus without complication (HCC)    Headache    migraines   Mental disability    Sleep apnea     Past Surgical History:  Procedure Laterality Date   ANTERIOR CRUCIATE LIGAMENT REPAIR Right 05/11/2022   Procedure: RIGHT KNEE ARTHROSCOPY; LATERAL MENISECTOMY; ANTERIOR CRUCIATE LIGAMENT (ACL) REPAIR; CHONDROPLASTY;  Surgeon: Shari Sieving, MD;  Location: [facility];  Service: Orthopedics;  Laterality: Right;   FOOT SURGERY     KNEE ARTHROSCOPY W/ ACL RECONSTRUCTION Left 10/12/2020   TOOTH EXTRACTION     Family History:   Family History  Problem Relation Age of Onset   High blood pressure Mother    Anxiety disorder Mother    Diabetes Maternal Grandmother    Family Psychiatric  History: Uncle heard voices  Social History:  Social History   Substance and Sexual Activity  Alcohol Use Not Currently     Social History   Substance and Sexual Activity  Drug Use No    Social History   Socioeconomic History   Marital status: Single    Spouse name: Not on file   Number of children: 0   Years of education: Not on file   Highest education level: Not on file  Occupational History   Occupation: Nature Conservation Officer     Comment: [employer]   Tobacco Use   Smoking status: Never   Smokeless tobacco: Never  Vaping Use   Vaping status: Never Used  Substance and Sexual Activity   Alcohol use: Not Currently   Drug use: No   Sexual activity: Not Currently  Other Topics Concern   Not on file  Social History Narrative   Not on file   Social Drivers of Health   Tobacco Use: Low Risk (07/29/2024)   Patient History    Smoking Tobacco Use: Never    Smokeless Tobacco Use: Never    Passive Exposure: Not on file  Financial Resource Strain: Not on file  Food Insecurity: No Food Insecurity (07/29/2024)   Epic    Worried About Programme Researcher, Broadcasting/film/video in the Last Year: Never true    Ran Out of Food in the Last Year: Never true  Transportation Needs: No Transportation Needs (07/29/2024)   Epic    Lack of Transportation (Medical): No    Lack of Transportation (Non-Medical): No  Physical Activity: Not on file  Stress: Not on file  Social Connections: Not on file  Depression (EYV7-0): Not on file  Alcohol Screen: Low Risk (07/29/2024)   Alcohol Screen    Last Alcohol Screening Score (AUDIT): 0  Housing: Low Risk (07/29/2024)   Epic    Unable to Pay for Housing in the Last Year: No    Number of Times Moved in the Last Year: 0    Homeless in the Last Year: No  Utilities: Not At Risk (07/29/2024)   Epic    Threatened  with loss of utilities: No  Health Literacy: Not on file    Hospital Course:    During the patient's hospitalization, patient had extensive initial psychiatric evaluation, and follow-up psychiatric evaluations every day.  Psychiatric diagnoses provided upon initial assessment: Schiozaffective disorder, bipolar type, current episode depressed   Patient's psychiatric medications were adjusted on admission:   -- Completed IVC 12/11 given safety concerns.  -- Continue home Olanzapine  20 mg qday -- Start home lamictal  at 50 mg qday. Ordered lamictal  serum labs.   #HTN -- Home amlodipine  5 -- Given high BPs, may increase   #HLD -- Home atorvastatin  40   #Pain -- Gabapentin  300 mg at bedtime -- Scheduled naproxen     #T2DM -- Patient checks glucoses at home but does not take insulin . Glucoses WNL yesterday.  During the hospitalization, other adjustments were made to the patient's psychiatric medication regimen:   -- Completed IVC 12/11 given safety concerns.  -- Continue home Olanzapine  20 mg qday -- Continue home lamictal  at 50 mg qday. Ordered lamictal  serum labs (patient refused). -- Started Propranolol  10 mg TID for behavioral control and anxiety.    #HTN -- Home amlodipine  5 -- Given high BPs, may increase   #HLD -- Home atorvastatin  40   #Pain -- Gabapentin  300 mg at bedtime -- Scheduled naproxen     #T2DM -- Patient checks glucoses at home but does not take insulin . Glucoses WNL yesterday.  Patient's care was discussed during the interdisciplinary team meeting every day during the hospitalization.  The patient denied having side effects to prescribed psychiatric medication.  Gradually, patient started adjusting to milieu. The patient was evaluated each day by a clinical provider to ascertain response to treatment. Improvement was noted by the patient's report of decreasing symptoms, improved sleep and appetite, affect, medication tolerance, behavior, and  participation in unit programming.  Patient was asked each day to complete a self inventory noting mood, mental status, pain, new symptoms, anxiety and concerns.   Symptoms were reported as significantly decreased or resolved completely by discharge.  The patient reports that their mood is stable.  The patient denied having suicidal thoughts for more than 48 hours prior to discharge.  Patient denies having homicidal thoughts.  Patient denies having auditory hallucinations.  Patient denies any visual hallucinations or other symptoms of psychosis.  The patient was motivated to continue taking medication with a goal of continued improvement in mental health.   The patient reports their target psychiatric symptoms of command auditory hallucinations with suicidal thinking responded well to the psychiatric medications, and the patient reports overall benefit other psychiatric hospitalization. Supportive psychotherapy was provided to the patient. The patient also participated in regular group therapy while hospitalized. Coping skills, problem solving as well as relaxation therapies were also part of the  unit programming.  Labs were reviewed with the patient, and abnormal results were discussed with the patient.  The patient is able to verbalize their individual safety plan to this provider.  # It is recommended to the patient to continue psychiatric medications as prescribed, after discharge from the hospital.    # It is recommended to the patient to follow up with your outpatient psychiatric provider and PCP.  # It was discussed with the patient, the impact of alcohol, drugs, tobacco have been there overall psychiatric and medical wellbeing, and total abstinence from substance use was recommended the patient.ed.  # Prescriptions provided or sent directly to preferred pharmacy at discharge. Patient agreeable to plan. Given opportunity to ask questions. Appears to feel comfortable with discharge.    # In  the event of worsening symptoms, the patient is instructed to call the crisis hotline, 911 and or go to the nearest ED for appropriate evaluation and treatment of symptoms. To follow-up with primary care provider for other medical issues, concerns and or health care needs  # Patient was discharged home with cousin with a plan to follow up as noted below.  On day of discharge, patient described feeling at baseline. Denied voices and suicidal ideation. Affect appears at baseline. Understands that grandmother's service will be a very sad experience but denies HI and can verbalize plan to spend some time alone if she gets overwhelmed. Discussed thoroughly with patient calling 911, 988 or going to the ER if things get out of hand. Has needed no PRNs for agitation for 72 hours. Otherwise very calm and pleasant, brightening appropriately. No concerns from family at discharge.   Added propranolol  10 mg TID for behavioral control. Patient said that she had not been regularly taking her medications, counseled her to do this every day. Restarted on home meds otherwise. Patient was prescribed Lamictal  150 mg BID -- per mother, patient had been without all of her medications for about a month earlier this year given a pharmacy issue. Also reported inconsistencies taking them in the past. For this reason, and given no history of seizure, this medication was reduced to 50 mg qday out of an excess of caution. Recommend re-titrating slowly.    Physical Findings: AIMS:  , ,  ,  ,  ,  ,   CIWA:    COWS:     Musculoskeletal: Strength & Muscle Tone: within normal limits Gait & Station: normal Patient leans: N/A   Psychiatric Specialty Exam:  Appearance: obese black woman sitting upright on bench, calmly laying in bed  Behavior: Cooperative  Attitude: Accommodating  Speech: simpler syntax, normal rate, rhythm, tone, volume  Mood: Not doing too good  Affect: Dysphoric with reactive mood  Thought Process:  Linear, logical, goal directed  Thought Content: Denied paranoia this morning  SI/HI: Denied active or passive SI or HI this morning  Perceptions: denies, not seen responding  Judgement: fair  Insight: fair  Fund of Knowledge: WNL     Physical Exam: Physical Exam Vitals reviewed.  Constitutional:      Appearance: She is obese. She is not ill-appearing or diaphoretic.  Pulmonary:     Effort: Pulmonary effort is normal. No respiratory distress.  Neurological:     Mental Status: She is alert.  Psychiatric:        Mood and Affect: Mood normal.        Behavior: Behavior normal.        Thought Content: Thought content normal.  Review of Systems  Constitutional:  Negative for chills and fever.  Gastrointestinal:  Negative for nausea and vomiting.   Blood pressure 127/82, pulse 86, temperature 98.4 F (36.9 C), resp. rate 16, height 6' (1.829 m), weight (!) 142.9 kg, SpO2 100%. Body mass index is 42.72 kg/m.   Tobacco Use History[1] Tobacco Cessation:  N/A, patient does not currently use tobacco products   Blood Alcohol level:  Lab Results  Component Value Date   [facility] <15 07/28/2024   ETH <10 06/13/2023    Metabolic Disorder Labs:  Lab Results  Component Value Date   HGBA1C 5.1 07/31/2024   MPG 99.67 07/31/2024   MPG 136.98 04/07/2023   No results found for: PROLACTIN Lab Results  Component Value Date   CHOL 180 07/31/2024   TRIG 145 07/31/2024   HDL 55 07/31/2024   CHOLHDL 3.3 07/31/2024   VLDL 29 07/31/2024   LDLCALC 96 07/31/2024   LDLCALC 63 04/07/2023    See Psychiatric Specialty Exam and Suicide Risk Assessment completed by Attending Physician prior to discharge.  Discharge destination:  Other:  Home with cousin  Is patient on multiple antipsychotic therapies at discharge:  No     Allergies as of 08/03/2024   No Known Allergies      Medication List     STOP taking these medications    hydrOXYzine  50 MG tablet Commonly known as:  ATARAX        TAKE these medications      Indication  amLODipine  5 MG tablet Commonly known as: NORVASC  Take 1 tablet (5 mg total) by mouth daily. Start taking on: August 04, 2024  Indication: High Blood Pressure   atorvastatin  40 MG tablet Commonly known as: LIPITOR Take 40 mg by mouth every evening.  Indication: High Amount of Fats in the Blood   baclofen  10 MG tablet Commonly known as: LIORESAL  Take 10 mg by mouth daily as needed (for headaches).  Indication: Headaches   gabapentin  300 MG capsule Commonly known as: NEURONTIN  Take 300 mg by mouth every evening.  Indication: Generalized Anxiety Disorder   lamoTRIgine  25 MG tablet Commonly known as: LAMICTAL  Take 2 tablets (50 mg total) by mouth daily. Start taking on: August 04, 2024 What changed:  medication strength how much to take when to take this  Indication: Manic-Depression   meloxicam  15 MG tablet Commonly known as: MOBIC  Take 1 tablet (15 mg total) by mouth daily.  Indication: Acute Pain   metFORMIN  500 MG tablet Commonly known as: GLUCOPHAGE  Take 1 tablet (500 mg total) by mouth daily with breakfast. Start taking on: August 04, 2024  Indication: Body Weight Gain due to Antipsychotic Medication Use   OLANZapine  20 MG tablet Commonly known as: ZYPREXA  Take 1 tablet (20 mg total) by mouth at bedtime.  Indication: Schizophrenia   propranolol  10 MG tablet Commonly known as: INDERAL  Take 1 tablet (10 mg total) by mouth 3 (three) times daily.  Indication: Feeling Anxious        Follow-up Information     Inc[facility] Schedule an appointment as soon as possible for a visit.   Specialty: Professional Counselor Contact information: [address] [place] [address] [phone]         [facility] Schedule an appointment as soon as possible for a visit.   Contact information: [address] [place] [address] [phone]  Follow-up recommendations/Comments:    -Follow-up with your outpatient psychiatric provider -instructions on appointment date, time, and address (location) are provided to you in discharge paperwork.  -Take your psychiatric medications as prescribed at discharge - instructions are provided to you in the discharge paperwork  -Follow-up with outpatient primary care doctor and other specialists -for management of chronic medical disease, including: Diabetes, high blood pressure.   -Recommend abstinence from alcohol, tobacco, and other illicit drug use at discharge.   -If your psychiatric symptoms recur, worsen, or if you have side effects to your psychiatric medications, call your outpatient psychiatric provider, 911, 988 or go to the nearest emergency department.  -If suicidal thoughts recur, call your outpatient psychiatric provider, 911, 988 or go to the nearest emergency department.  Signed: Zaiyden Strozier, MD 08/03/2024, 1:06 PM           [1]  Social History Tobacco Use  Smoking Status Never  Smokeless Tobacco Never

## 2024-08-03 NOTE — [facility] Suicide Risk Assessment (Signed)
 [facility] Discharge Suicide Risk Assessment   Principal Problem: Bipolar affective disorder [facility]) Discharge Diagnoses: Principal Problem:   Bipolar affective disorder (HCC)  HPI: Cindy Phillips is a 33 y.o. female  with a past psychiatric history of schizoaffective disorder, bipolar type and intellectual disability. Patient initially arrived to [facility] on 12/9 for suicidal and homicidal ideations in the setting of command auditory hallucionations, and admitted to [facility] under IVC on 12/10 for acute safety concerns. PMHx is significant for intellectual disability, T2DM, hyperlipidemia, vitamin D  deficiency, OSA, pre-diabetes, bilateral ACL tears.    Musculoskeletal: Strength & Muscle Tone: within normal limits Gait & Station: normal Patient leans: N/A  Psychiatric Specialty Exam  Appearance: obese black woman sitting upright on bench, calmly laying in bed  Behavior: Cooperative  Attitude: Accommodating  Speech: simpler syntax, normal rate, rhythm, tone, volume  Mood: Not doing too good  Affect: Dysphoric with reactive mood  Thought Process: Linear, logical, goal directed  Thought Content: Denied paranoia this morning  SI/HI: Denied active or passive SI or HI this morning  Perceptions: denies, not seen responding  Judgement: fair  Insight: fair  Fund of Knowledge: WNL    Physical Exam: Physical Exam Vitals reviewed.  Constitutional:      Appearance: She is obese. She is not ill-appearing or diaphoretic.  Pulmonary:     Effort: No respiratory distress.  Neurological:     Mental Status: She is alert.    Review of Systems  Constitutional:  Negative for chills and fever.  Gastrointestinal:  Negative for abdominal pain, nausea and vomiting.  All other systems reviewed and are negative.  Blood pressure 127/82, pulse 86, temperature 98.4 F (36.9 C), resp. rate 16, height 6' (1.829 m), weight (!) 142.9 kg, SpO2 100%. Body mass index is 42.72 kg/m.  Mental Status Per Nursing  Assessment::   On Admission:  Suicidal ideation indicated by patient  Nursing information obtained from:  Patient Demographic factors:  Low socioeconomic status, Living alone Current Mental Status:  Suicidal ideation indicated by patient Loss Factors:  NA Historical Factors:  Impulsivity Risk Reduction Factors:  Positive social support   Continued Clinical Symptoms:  Depression:   Impulsivity Recent sense of peace/wellbeing Schizophrenia:   Depressive state Previous Psychiatric Diagnoses and Treatments  Cognitive Features That Contribute To Risk:  Closed-mindedness and Loss of executive function    Suicide Risk:  Mild: There are no identifiable suicide plans, no associated intent, mild dysphoria and related symptoms, good self-control (both objective and subjective assessment), few other risk factors, and identifiable protective factors, including available and accessible social support.   Follow-up Information     [facility] Schedule an appointment as soon as possible for a visit.   Specialty: Professional Counselor Contact information: [address] [place] [address] [phone]         [facility] Schedule an appointment as soon as possible for a visit.   Contact information: [address] [place] [address] [phone]                 Plan Of Care/Follow-up recommendations:   -Follow-up with your outpatient psychiatric provider -instructions on appointment date, time, and address (location) are provided to you in discharge paperwork.  -Take your psychiatric medications as prescribed at discharge - instructions are provided to you in the discharge paperwork  -Recommend abstinence from alcohol, tobacco, and other illicit drug use at discharge.   -If your psychiatric symptoms recur, worsen, or if  you have side effects to your psychiatric medications, call your outpatient psychiatric provider, 911, 988 or  go to the nearest emergency department.  -If suicidal thoughts recur, call your outpatient psychiatric provider, 911, 988 or go to the nearest emergency department.   Mikhai Bienvenue, MD 08/03/2024, 8:54 AM

## 2024-08-03 NOTE — Discharge Instructions (Addendum)
-  Follow-up with your outpatient psychiatric provider -instructions on appointment date, time, and address (location) are provided to you in discharge paperwork.  -Take your psychiatric medications as prescribed at discharge - instructions are provided to you in the discharge paperwork  -Follow-up with outpatient primary care doctor and other specialists -for management of chronic medical disease, including: Diabetes, high blood pressure.   -Recommend abstinence from alcohol, tobacco, and other illicit drug use at discharge.   -If your psychiatric symptoms recur, worsen, or if you have side effects to your psychiatric medications, call your outpatient psychiatric provider, 911, 988 or go to the nearest emergency department.  -If suicidal thoughts recur, call your outpatient psychiatric provider, 911, 988 or go to the nearest emergency department.

## 2024-08-03 NOTE — Group Note (Signed)
 Date:  08/03/2024 Time:  9:20 AM  Group Topic/Focus:  Goals Group:   The focus of this group is to help patients establish daily goals to achieve during treatment and discuss how the patient can incorporate goal setting into their daily lives to aide in recovery.    Participation Level:  Active  Participation Quality:  Appropriate  Affect:  Appropriate  Cognitive:  Appropriate  Insight: Appropriate  Engagement in Group:  Improving  Modes of Intervention:  Discussion    Cindy Phillips [name] 08/03/2024, 9:20 AM

## 2024-08-03 NOTE — Progress Notes (Signed)
 Pt discharged to lobby. Pt was stable and appreciative at that time. All papers and prescriptions were given and valuables returned. Verbal understanding expressed. Denies SI/HI and A/VH. Pt given opportunity to express concerns and ask questions.

## 2024-08-05 ENCOUNTER — Ambulatory Visit

## 2024-08-05 DIAGNOSIS — M25572 Pain in left ankle and joints of left foot: Secondary | ICD-10-CM | POA: Diagnosis present

## 2024-08-05 DIAGNOSIS — M25562 Pain in left knee: Secondary | ICD-10-CM | POA: Diagnosis present

## 2024-08-05 DIAGNOSIS — M6281 Muscle weakness (generalized): Secondary | ICD-10-CM

## 2024-08-05 DIAGNOSIS — R2689 Other abnormalities of gait and mobility: Secondary | ICD-10-CM

## 2024-08-05 DIAGNOSIS — M25571 Pain in right ankle and joints of right foot: Secondary | ICD-10-CM | POA: Diagnosis present

## 2024-08-05 DIAGNOSIS — G8929 Other chronic pain: Secondary | ICD-10-CM

## 2024-08-05 NOTE — Therapy (Signed)
 OUTPATIENT PHYSICAL THERAPY TREATMENT      Patient Name: Cindy Phillips MRN: [id number] DOB:06-13-1991, 33 y.o., female Today's Date: 08/05/2024  END OF SESSION:  PT End of Session - 08/05/24 1529     Visit Number 33    Number of Visits 35    Date for Recertification  08/19/24    Authorization Type [employer] Dual Complete    PT Start Time 1530    PT Stop Time 1608    PT Time Calculation (min) 38 min    Activity Tolerance Patient tolerated treatment well    Behavior During Therapy WFL for tasks assessed/performed          Past Medical History:  Diagnosis Date   Arthritis    Bipolar 1 disorder (HCC)    Bowel incontinence    Depression    Diabetes mellitus without complication (HCC)    Headache    migraines   Mental disability    Sleep apnea    Past Surgical History:  Procedure Laterality Date   ANTERIOR CRUCIATE LIGAMENT REPAIR Right 05/11/2022   Procedure: RIGHT KNEE ARTHROSCOPY; LATERAL MENISECTOMY; ANTERIOR CRUCIATE LIGAMENT (ACL) REPAIR; CHONDROPLASTY;  Surgeon: Shari Sieving, MD;  Location: [facility];  Service: Orthopedics;  Laterality: Right;   FOOT SURGERY     KNEE ARTHROSCOPY W/ ACL RECONSTRUCTION Left 10/12/2020   TOOTH EXTRACTION     Patient Active Problem List   Diagnosis Date Noted   Bipolar affective disorder (HCC) 07/29/2024   Pain in left elbow 07/27/2020   Prediabetes 02/03/2019   Obstructive sleep apnea 04/23/2018   Other fatigue 11/26/2017   Shortness of breath on exertion 11/26/2017   Type 2 diabetes mellitus without complication, without long-term current use of insulin  (HCC) 11/26/2017   Other hyperlipidemia 11/26/2017   Vitamin D  deficiency 11/26/2017   Schizoaffective disorder, bipolar type (HCC) 09/14/2014   Intellectual disability 09/10/2014    PCP: Leigh Lung, MD  REFERRING PROVIDER:  02/19/2024 - Knees - Shari Sieving, MD 05/22/2024 - Verlena GLENWOOD Joya Asberry, DPM   REFERRING DIAG:  02/19/2024 - Hx of ACL Reconstruction   05/22/2024 -  M76.72 (ICD-10-CM) - Peroneal tendonitis, left M76.71 (ICD-10-CM) - Peroneal tendonitis, right   THERAPY DIAG:  Pain in left ankle and joints of left foot  Muscle weakness (generalized)  Pain in right ankle and joints of right foot  Chronic pain of left knee  Other abnormalities of gait and mobility  Rationale for Evaluation and Treatment: Rehabilitation  ONSET DATE: Chronic  SUBJECTIVE:   SUBJECTIVE STATEMENT: Patient reports that she had a death in the family recently so she isn't feeling very good today.   EVAL: Pt presents to PT with reports of chronic bilateral knee pain in presence of previous ACL repairs. She is well known to therapist and has been seen for both ACL recoveries in past. She currently has significant pain when standing for long periods of time, or when walking/squatting. Denies referral of symptoms past knee, pain is sharp and along bilateral knee joint lines.   PERTINENT HISTORY: Bipolar disorder, Depression, intellectual disability, bilateral ACL reconstruction   PAIN:  Are you having pain?  Yes: NPRS scale: currently 0/10, not at the moment Worst: 10/10 Pain location: bilateral inferior knee Pain description: sharp, sore Aggravating factors: standing, stairs,  Relieving factors: rest  PRECAUTIONS: None  RED FLAGS: None   WEIGHT BEARING RESTRICTIONS: No  FALLS:  Has patient fallen in last 6 months? -   LIVING ENVIRONMENT: Lives with: lives with their family  Lives in: House/apartment Stairs: 4 STE - handrail  Has following equipment at home: None  OCCUPATION: Disability  PLOF: Independent with basic ADLs  PATIENT GOALS: decrease knee pain, improve strength and be able to work out  NEXT MD VISIT: PRN  OBJECTIVE:  Note: Objective measures were completed at Evaluation unless otherwise noted.  DIAGNOSTIC FINDINGS: N/A  PATIENT SURVEYS:  LEFS: 34/80 05/28/2024: 54/80  COGNITION: Overall cognitive status: Within  functional limits for tasks assessed     SENSATION: WFL  POSTURE: rounded shoulders, forward head, increased lumbar lordosis, and large body habitus  PALPATION: TTP to distal quad  LOWER EXTREMITY ROM:  Active ROM Right eval Left eval  Hip flexion    Hip extension    Hip abduction    Hip adduction    Hip internal rotation    Hip external rotation    Knee flexion 114 118  Knee extension 0 5  Ankle dorsiflexion    Ankle plantarflexion    Ankle inversion    Ankle eversion     (Blank rows = not tested)  LOWER EXTREMITY MMT:  MMT Right eval Left eval Right 04/28/24 Left 04/28/24  Hip flexion 3+ 3+ 4 4  Hip extension      Hip abduction 3+ 3+ 4 4  Hip adduction      Hip internal rotation      Hip external rotation      Knee flexion 3+ 3+ 4 4  Knee extension 3+ 3+ 4 4  Ankle dorsiflexion      Ankle plantarflexion      Ankle inversion      Ankle eversion       (Blank rows = not tested)  LOWER EXTREMITY SPECIAL TESTS:  Knee special tests: Lachman Test: negative  FUNCTIONAL TESTS: 30 Second Sit to Stand: 8 reps 04/09/2024: 13 reps  06/04/24:  SLS: 3 sec each  GAIT: Distance walked: 51ft Assistive device utilized: None Level of assistance: Complete Independence Comments: decreased gait speed, antalgia L  TREATMENT: [facility] Adult PT Treatment:                                                DATE: 08/05/24 Therapeutic Exercise: Bike level 3 x 5 mins Seated hamstring curl 3x10 45# Seated knee extension 2x10 35# 3x6 SL - 15# Slant board gastroc stretch 2x1' Neuromuscular re-ed: Semi-Tandem stance on foam 2x30 each - occasional LOB, able to self correct Side stepping on foam beam x 3 laps - CGA with several instance of LOB Therapeutic Activity: Sled push/pull 50# x4 laps STS to raised table - 3x5  [facility] Adult PT Treatment:                                                DATE: 07/23/24 (Ankle braces donned for all exercises today) Therapeutic Exercise: Bike  level 3 x 5 mins Seated hamstring curl 3x10 55# Seated knee extension x10 35# 3x6 SL - 15# Slant board gastroc stretch 2x45''  Neuromuscular re-ed: Semi-Tandem stance on foam 2x30 each - occasional LOB, able to self correct Side stepping on foam beam x 3 laps - CGA with several instance of LOB  Therapeutic Activity: Standing hip abd/ext 2x10 37.5# Sled push/pull 50#  x4 laps STS to raised table - 3x5  [facility] Adult PT Treatment:                                                DATE: 07/15/24 (Ankle braces donned for all exercises today) Therapeutic Exercise: Bike level 3 x 5 mins Seated hamstring curl 2x10 55# Seated knee extension 2x10 35# Slant board gastroc stretch 3x30 Slant board heel raise 2x10 Neuromuscular re-ed: Semi-Tandem stance on foam 2x30 each - occasional LOB, able to self correct SLS on airex 2x30 ea (UE support today) Therapeutic Activity: Standing hip abd/ext 2x10 37.5# Sled push/pull 50# x4 laps    PATIENT EDUCATION:  Education details: eval findings, LEFS, HEP, POC Person educated: Patient Education method: Explanation, Demonstration, and Handouts Education comprehension: verbalized understanding and returned demonstration  HOME EXERCISE PROGRAM: Access Code: [email or web address] URL: [email or web address] Date: 06/25/2024 Prepared by: Alm Kingdom  Exercises - Supine Heel Slide  - 1 x daily - 7 x weekly - 2 sets - 10 reps - 5 sec hold - Supine Quadricep Sets  - 1 x daily - 7 x weekly - 2 sets - 10 reps - 5 sec hold - Active Straight Leg Raise with Quad Set  - 1 x daily - 7 x weekly - 3-4 sets - 5 reps - Sidelying Hip Abduction  - 1 x daily - 7 x weekly - 3-4 sets - 5 reps - Tandem Stance  - 1 x daily - 7 x weekly - 2-3 reps - 30 sec hold - Heel Toe Raises with Counter Support  - 1 x daily - 7 x weekly - 2 sets - 15 reps - Long Sitting Ankle Inversion with Anchored Resistance  - 1 x daily - 7 x weekly - 3 sets - 10 reps - green band hold -  Seated Calf Raise With Small Ball at Heels  - 1 x daily - 7 x weekly - 3 sets - 15 reps  ASSESSMENT:  CLINICAL IMPRESSION: Patient presents to PT in a muted mood today due to recent death in the family, she does not report any pain in her knees or ankles. Session today focused on balance and LE strengthening to improve functional independence. Patient was able to tolerate all prescribed exercises with no adverse effects, though she moved slower through all exercises today. Patient continues to benefit from skilled PT services and should be progressed as able to improve functional independence.   EVAL: Patient is a 33 y.o. F who was seen today for physical therapy evaluation and treatment for bilateral knee pain and discomfort. Physical findings are consistent with injury/surgical hx along with bilateral LE weakness particularly in lateral hip and quad. Her LEFS score shows moderate disability in performance of home ADLs and community level activities. Pt would benefit from skilled PT services with specific focus on LE strengthening for improving movement and decreasing knee pain.   OBJECTIVE IMPAIRMENTS: Abnormal gait, decreased activity tolerance, decreased mobility, difficulty walking, decreased ROM, decreased strength, and pain  ACTIVITY LIMITATIONS: carrying, lifting, sitting, standing, squatting, stairs, transfers, and locomotion level  PARTICIPATION LIMITATIONS: meal prep, cleaning, driving, shopping, community activity, and yard work  PERSONAL FACTORS: Time since onset of injury/illness/exacerbation and 3+ comorbidities: Bipolar disorder, Depression, intellectual disability, bilateral ACL reconstruction  are also affecting patient's functional outcome.   REHAB POTENTIAL: Good  CLINICAL DECISION MAKING: Evolving/moderate  complexity  EVALUATION COMPLEXITY: Moderate   GOALS: Goals reviewed with patient? No  SHORT TERM GOALS: Target date: 03/25/2024   Pt will be compliant and  knowledgeable with initial HEP for improved comfort and carryover Baseline: initial HEP given  Goal status: MET Pt reports compliance 03/26/24  2.  Pt will self report bilateral knee pain no greater than 7/10 for improved comfort and functional ability Baseline: 10/10 at worst 04/09/2024: 6/10 Goal status: MET  LONG TERM GOALS: Target date: 08/19/2024   Pt will improve LEFS to no less than 50/80 as proxy for functional improvement with home ADLs and higher level community activity Baseline: 34/80 04/09/2024: 36/80 05/28/2024: 54/80 Goal status: MET  2.  Pt will self report bilateral knee pain no greater than 3/10 for improved comfort and functional ability Baseline: 10/10 at worst 04/09/2024: 6/10 04/28/2024: 5/10 Goal status: IN PROGRESS   3.  Pt will increase 30 Second Sit to Stand rep count to no less than 12 reps for improved balance, strength, and functional mobility Baseline: 8 reps 04/09/2024: 12 reps 05/26/2024: 14 reps Goal status: MET   4.  Pt will improve all LE MMT to no less than 4/5 for all tested motions for improved knee stabilization and decreased pain Baseline: see MMT chart Goal status: MET  5.  Pt will be able to squat with hands to floor without increase in bilateral knee pain for improved comfort and functional ability  Baseline: unable 04/28/2024: slight loss of balance, able to pick up small box Goal status: PARTIALLY MET    6.  Pt will be able to ambulate 2 miles without increase in knee pain or any instance of knee buckling for improve comfort and function Baseline: 1 miles Goal status: INITIAL  7.  Pt will improve bilateral SLS time to at least 20 seconds each for improved balance and ankle stability Baseline: 3 seconds each Goal status: INITIAL   PLAN:  PT FREQUENCY: 1-2x/week  PT DURATION: 6 weeks  PLANNED INTERVENTIONS: 97164- PT Re-evaluation, 97110-Therapeutic exercises, 97530- Therapeutic activity, [id number]- Neuromuscular re-education, 97535-  Self Care, [id number]- Manual therapy, [id number]- Gait training, H9716- Electrical stimulation (unattended), [id number]- Electrical stimulation (manual), 97016- Vasopneumatic device, Cryotherapy, and Moist heat  PLAN FOR NEXT SESSION: assess HEP response, quad and hip strengthening, progress functional lifting movements    Corean Pouch, PTA 08/05/2024, 4:08 PM

## 2024-08-11 ENCOUNTER — Ambulatory Visit

## 2024-08-18 NOTE — Therapy (Unsigned)
 " OUTPATIENT PHYSICAL THERAPY TREATMENT/PROGRESS NOTE/RE-CERT      Patient Name: JORITA BOHANON MRN: [id number] DOB:05/25/1991, 33 y.o., female Today's Date: 08/19/2024  END OF SESSION:  PT End of Session - 08/19/24 1222     Visit Number 34    Number of Visits 35    Date for Recertification  08/19/24    Authorization Type [employer] Dual Complete    PT Start Time 1215    PT Stop Time 1255    PT Time Calculation (min) 40 min    Activity Tolerance Patient tolerated treatment well    Behavior During Therapy WFL for tasks assessed/performed           Past Medical History:  Diagnosis Date   Arthritis    Bipolar 1 disorder (HCC)    Bowel incontinence    Depression    Diabetes mellitus without complication (HCC)    Headache    migraines   Mental disability    Sleep apnea    Past Surgical History:  Procedure Laterality Date   ANTERIOR CRUCIATE LIGAMENT REPAIR Right 05/11/2022   Procedure: RIGHT KNEE ARTHROSCOPY; LATERAL MENISECTOMY; ANTERIOR CRUCIATE LIGAMENT (ACL) REPAIR; CHONDROPLASTY;  Surgeon: Shari Sieving, MD;  Location: [facility];  Service: Orthopedics;  Laterality: Right;   FOOT SURGERY     KNEE ARTHROSCOPY W/ ACL RECONSTRUCTION Left 10/12/2020   TOOTH EXTRACTION     Patient Active Problem List   Diagnosis Date Noted   Bipolar affective disorder (HCC) 07/29/2024   Pain in left elbow 07/27/2020   Prediabetes 02/03/2019   Obstructive sleep apnea 04/23/2018   Other fatigue 11/26/2017   Shortness of breath on exertion 11/26/2017   Type 2 diabetes mellitus without complication, without long-term current use of insulin  (HCC) 11/26/2017   Other hyperlipidemia 11/26/2017   Vitamin D  deficiency 11/26/2017   Schizoaffective disorder, bipolar type (HCC) 09/14/2014   Intellectual disability 09/10/2014    PCP: Leigh Lung, MD  REFERRING PROVIDER:  02/19/2024 - Knees - Shari Sieving, MD 05/22/2024 - Verlena GLENWOOD Joya Asberry, DPM   REFERRING DIAG:  02/19/2024 - Hx of ACL  Reconstruction  05/22/2024 -  M76.72 (ICD-10-CM) - Peroneal tendonitis, left M76.71 (ICD-10-CM) - Peroneal tendonitis, right   THERAPY DIAG:  Pain in left ankle and joints of left foot  Muscle weakness (generalized)  Pain in right ankle and joints of right foot  Chronic pain of left knee  Rationale for Evaluation and Treatment: Rehabilitation  ONSET DATE: Chronic  SUBJECTIVE:   SUBJECTIVE STATEMENT: Patient arrives for re-assessment of knee ankle symptoms.  Describes symptoms of B PF dysfunction as well as chronic achilles and peroneal tendinitis.  TRPs noted in medial gastroc regions, pes planus B and limited DF due to soft tissue restrictions and altered foot biomechanics.  HEP updated, recommend continue PT to address found deficits.   EVAL: Pt presents to PT with reports of chronic bilateral knee pain in presence of previous ACL repairs. She is well known to therapist and has been seen for both ACL recoveries in past. She currently has significant pain when standing for long periods of time, or when walking/squatting. Denies referral of symptoms past knee, pain is sharp and along bilateral knee joint lines.   PERTINENT HISTORY: Bipolar disorder, Depression, intellectual disability, bilateral ACL reconstruction   PAIN:  Are you having pain?  Yes: NPRS scale: currently 0/10, not at the moment Worst: 10/10 Pain location: bilateral inferior knee Pain description: sharp, sore Aggravating factors: standing, stairs,  Relieving factors: rest  PRECAUTIONS: None  RED FLAGS: None   WEIGHT BEARING RESTRICTIONS: No  FALLS:  Has patient fallen in last 6 months? -   LIVING ENVIRONMENT: Lives with: lives with their family Lives in: House/apartment Stairs: 4 STE - handrail  Has following equipment at home: None  OCCUPATION: Disability  PLOF: Independent with basic ADLs  PATIENT GOALS: decrease knee pain, improve strength and be able to work out  NEXT MD VISIT:  PRN  OBJECTIVE:  Note: Objective measures were completed at Evaluation unless otherwise noted.  DIAGNOSTIC FINDINGS: N/A  PATIENT SURVEYS:  LEFS: 34/80 05/28/2024: 54/80  COGNITION: Overall cognitive status: Within functional limits for tasks assessed     SENSATION: WFL  POSTURE: rounded shoulders, forward head, increased lumbar lordosis, and large body habitus  PALPATION: TTP to distal quad  LOWER EXTREMITY ROM:  Active ROM Right eval Left eval 08/19/24 B  Hip flexion     Hip extension     Hip abduction     Hip adduction     Hip internal rotation     Hip external rotation     Knee flexion 114 118 120d  Knee extension 0 5 0d  Ankle dorsiflexion     Ankle plantarflexion     Ankle inversion     Ankle eversion      (Blank rows = not tested)  LOWER EXTREMITY MMT:  MMT Right eval Left eval Right 04/28/24 Left 04/28/24 08/19/24 B  Hip flexion 3+ 3+ 4 4 4   Hip extension       Hip abduction 3+ 3+ 4 4 4   Hip adduction       Hip internal rotation       Hip external rotation       Knee flexion 3+ 3+ 4 4 4   Knee extension 3+ 3+ 4 4 4   Ankle dorsiflexion     4  Ankle plantarflexion     4  Ankle inversion       Ankle eversion        (Blank rows = not tested)  LOWER EXTREMITY SPECIAL TESTS:  Knee special tests: Lachman Test: negative  FUNCTIONAL TESTS: 30 Second Sit to Stand: 8 reps 04/09/2024: 13 reps  06/04/24:  SLS: 3 sec each  GAIT: Distance walked: 40ft Assistive device utilized: None Level of assistance: Complete Independence Comments: decreased gait speed, antalgia L  TREATMENT: [facility] Adult PT Treatment:                                                DATE: 08/19/24 Therapeutic Activity: B gastroc stretch with supination bias 30s x2 TKE with adduction 2s hold 15x Re-assessment of progress, ROM and LEFS for re-certification  [facility] Adult PT Treatment:                                                DATE: 08/05/24 Therapeutic Exercise: Bike level 3  x 5 mins Seated hamstring curl 3x10 45# Seated knee extension 2x10 35# 3x6 SL - 15# Slant board gastroc stretch 2x1' Neuromuscular re-ed: Semi-Tandem stance on foam 2x30 each - occasional LOB, able to self correct Side stepping on foam beam x 3 laps - CGA with several instance of LOB Therapeutic Activity: Sled push/pull  50# x4 laps STS to raised table - 3x5  [facility] Adult PT Treatment:                                                DATE: 07/23/24 (Ankle braces donned for all exercises today) Therapeutic Exercise: Bike level 3 x 5 mins Seated hamstring curl 3x10 55# Seated knee extension x10 35# 3x6 SL - 15# Slant board gastroc stretch 2x45''  Neuromuscular re-ed: Semi-Tandem stance on foam 2x30 each - occasional LOB, able to self correct Side stepping on foam beam x 3 laps - CGA with several instance of LOB  Therapeutic Activity: Standing hip abd/ext 2x10 37.5# Sled push/pull 50# x4 laps STS to raised table - 3x5  [facility] Adult PT Treatment:                                                DATE: 07/15/24 (Ankle braces donned for all exercises today) Therapeutic Exercise: Bike level 3 x 5 mins Seated hamstring curl 2x10 55# Seated knee extension 2x10 35# Slant board gastroc stretch 3x30 Slant board heel raise 2x10 Neuromuscular re-ed: Semi-Tandem stance on foam 2x30 each - occasional LOB, able to self correct SLS on airex 2x30 ea (UE support today) Therapeutic Activity: Standing hip abd/ext 2x10 37.5# Sled push/pull 50# x4 laps    PATIENT EDUCATION:  Education details: eval findings, LEFS, HEP, POC Person educated: Patient Education method: Explanation, Demonstration, and Handouts Education comprehension: verbalized understanding and returned demonstration  HOME EXERCISE PROGRAM: Access Code: [email or web address] URL: [email or web address] Date: 08/19/2024 Prepared by: Marthann Abshier  Exercises - Supine Heel Slide  - 1 x daily - 7 x weekly - 2 sets - 10 reps -  5 sec hold - Supine Quadricep Sets  - 1 x daily - 7 x weekly - 2 sets - 10 reps - 5 sec hold - Active Straight Leg Raise with Quad Set  - 1 x daily - 7 x weekly - 3-4 sets - 5 reps - Sidelying Hip Abduction  - 1 x daily - 7 x weekly - 3-4 sets - 5 reps - Tandem Stance  - 1 x daily - 7 x weekly - 2-3 reps - 30 sec hold - Heel Toe Raises with Counter Support  - 1 x daily - 7 x weekly - 2 sets - 15 reps - Long Sitting Ankle Inversion with Anchored Resistance  - 1 x daily - 7 x weekly - 3 sets - 10 reps - green band hold - Seated Calf Raise With Small Ball at Heels  - 1 x daily - 7 x weekly - 3 sets - 15 reps - Knee extension with ball squeeze  - 1 x daily - 5 x weekly - 2 sets - 15 reps - Gastroc Stretch on Wall  - 1 x daily - 5 x weekly - 1 sets - 2 reps - 30s hold  ASSESSMENT:  CLINICAL IMPRESSION: Focus of session was re-assessment of ROM and progress as time allowed due to multiple affected regions.  EVAL: Patient is a 33 y.o. F who was seen today for physical therapy evaluation and treatment for bilateral knee pain and discomfort. Physical findings are consistent with injury/surgical hx along with bilateral LE weakness  particularly in lateral hip and quad. Her LEFS score shows moderate disability in performance of home ADLs and community level activities. Pt would benefit from skilled PT services with specific focus on LE strengthening for improving movement and decreasing knee pain.   OBJECTIVE IMPAIRMENTS: Abnormal gait, decreased activity tolerance, decreased mobility, difficulty walking, decreased ROM, decreased strength, and pain  ACTIVITY LIMITATIONS: carrying, lifting, sitting, standing, squatting, stairs, transfers, and locomotion level  PARTICIPATION LIMITATIONS: meal prep, cleaning, driving, shopping, community activity, and yard work  PERSONAL FACTORS: Time since onset of injury/illness/exacerbation and 3+ comorbidities: Bipolar disorder, Depression, intellectual disability,  bilateral ACL reconstruction  are also affecting patient's functional outcome.   REHAB POTENTIAL: Good  CLINICAL DECISION MAKING: Evolving/moderate complexity  EVALUATION COMPLEXITY: Moderate   GOALS: Goals reviewed with patient? No  SHORT TERM GOALS: Target date: 03/25/2024   Pt will be compliant and knowledgeable with initial HEP for improved comfort and carryover Baseline: initial HEP given  Goal status: MET Pt reports compliance 03/26/24  2.  Pt will self report bilateral knee pain no greater than 7/10 for improved comfort and functional ability Baseline: 10/10 at worst 04/09/2024: 6/10 Goal status: MET  LONG TERM GOALS: Target date: 10/17/24   Pt will improve LEFS to no less than 50/80 as proxy for functional improvement with home ADLs and higher level community activity Baseline: 34/80 04/09/2024: 36/80 05/28/2024: 54/80 08/19/24 19/80 including B ankles Goal status: Ongoing  2.  Pt will self report bilateral knee pain no greater than 3/10 for improved comfort and functional ability Baseline: 10/10 at worst 04/09/2024: 6/10 04/28/2024: 5/10 Goal status: IN PROGRESS   3.  Pt will increase 30 Second Sit to Stand rep count to no less than 12 reps for improved balance, strength, and functional mobility Baseline: 8 reps 04/09/2024: 12 reps 05/26/2024: 14 reps Goal status: MET   4.  Pt will improve all LE MMT to no less than 4/5 for all tested motions for improved knee stabilization and decreased pain Baseline: see MMT chart Goal status: Ongoing including ankles  5.  Pt will be able to squat with hands to floor without increase in bilateral knee pain for improved comfort and functional ability  Baseline: unable 04/28/2024: slight loss of balance, able to pick up small box Goal status: PARTIALLY MET    6.  Pt will be able to ambulate 2 miles without increase in knee pain or any instance of knee buckling for improve comfort and function Baseline: 1 miles Goal status:  Ongoing  7.  Pt will improve bilateral SLS time to at least 20 seconds each for improved balance and ankle stability Baseline: 3 seconds each Goal status: Ongoing   PLAN:  PT FREQUENCY: 1x/week  PT DURATION: 8 weeks  PLANNED INTERVENTIONS: 97164- PT Re-evaluation, 97110-Therapeutic exercises, 97530- Therapeutic activity, [id number]- Neuromuscular re-education, 97535- Self Care, [id number]- Manual therapy, [id number]- Gait training, H9716- Electrical stimulation (unattended), [id number]- Electrical stimulation (manual), 97016- Vasopneumatic device, Cryotherapy, and Moist heat  PLAN FOR NEXT SESSION: assess HEP response, quad and hip strengthening, progress functional lifting movements    Reyes CHRISTELLA Kohut, PT 08/19/2024, 1:03 PM    "

## 2024-08-19 ENCOUNTER — Ambulatory Visit

## 2024-08-19 DIAGNOSIS — G8929 Other chronic pain: Secondary | ICD-10-CM

## 2024-08-19 DIAGNOSIS — M25571 Pain in right ankle and joints of right foot: Secondary | ICD-10-CM

## 2024-08-19 DIAGNOSIS — M6281 Muscle weakness (generalized): Secondary | ICD-10-CM

## 2024-08-19 DIAGNOSIS — M25572 Pain in left ankle and joints of left foot: Secondary | ICD-10-CM | POA: Diagnosis not present

## 2024-08-24 ENCOUNTER — Ambulatory Visit: Attending: Podiatry

## 2024-08-24 DIAGNOSIS — R2689 Other abnormalities of gait and mobility: Secondary | ICD-10-CM | POA: Insufficient documentation

## 2024-08-24 DIAGNOSIS — M25571 Pain in right ankle and joints of right foot: Secondary | ICD-10-CM | POA: Insufficient documentation

## 2024-08-24 DIAGNOSIS — M25562 Pain in left knee: Secondary | ICD-10-CM | POA: Diagnosis present

## 2024-08-24 DIAGNOSIS — M25572 Pain in left ankle and joints of left foot: Secondary | ICD-10-CM | POA: Insufficient documentation

## 2024-08-24 DIAGNOSIS — G8929 Other chronic pain: Secondary | ICD-10-CM | POA: Diagnosis present

## 2024-08-24 DIAGNOSIS — M6281 Muscle weakness (generalized): Secondary | ICD-10-CM | POA: Insufficient documentation

## 2024-08-24 NOTE — Therapy (Signed)
 " OUTPATIENT PHYSICAL THERAPY TREATMENT      Patient Name: Cindy Phillips MRN: [id number] DOB:06/01/1991, 34 y.o., female Today's Date: 08/24/2024  END OF SESSION:  PT End of Session - 08/24/24 1036     Visit Number 35    Date for Recertification  10/17/24    Authorization Type [employer] Dual Complete    PT Start Time 1045    PT Stop Time 1123    PT Time Calculation (min) 38 min    Activity Tolerance Patient tolerated treatment well    Behavior During Therapy WFL for tasks assessed/performed            Past Medical History:  Diagnosis Date   Arthritis    Bipolar 1 disorder (HCC)    Bowel incontinence    Depression    Diabetes mellitus without complication (HCC)    Headache    migraines   Mental disability    Sleep apnea    Past Surgical History:  Procedure Laterality Date   ANTERIOR CRUCIATE LIGAMENT REPAIR Right 05/11/2022   Procedure: RIGHT KNEE ARTHROSCOPY; LATERAL MENISECTOMY; ANTERIOR CRUCIATE LIGAMENT (ACL) REPAIR; CHONDROPLASTY;  Surgeon: Shari Sieving, MD;  Location: [facility];  Service: Orthopedics;  Laterality: Right;   FOOT SURGERY     KNEE ARTHROSCOPY W/ ACL RECONSTRUCTION Left 10/12/2020   TOOTH EXTRACTION     Patient Active Problem List   Diagnosis Date Noted   Bipolar affective disorder (HCC) 07/29/2024   Pain in left elbow 07/27/2020   Prediabetes 02/03/2019   Obstructive sleep apnea 04/23/2018   Other fatigue 11/26/2017   Shortness of breath on exertion 11/26/2017   Type 2 diabetes mellitus without complication, without long-term current use of insulin  (HCC) 11/26/2017   Other hyperlipidemia 11/26/2017   Vitamin D  deficiency 11/26/2017   Schizoaffective disorder, bipolar type (HCC) 09/14/2014   Intellectual disability 09/10/2014    PCP: Leigh Lung, MD  REFERRING PROVIDER:  02/19/2024 - Knees - Shari Sieving, MD 05/22/2024 - Verlena GLENWOOD Joya Asberry, DPM   REFERRING DIAG:  02/19/2024 - Hx of ACL Reconstruction  05/22/2024 -  M76.72  (ICD-10-CM) - Peroneal tendonitis, left M76.71 (ICD-10-CM) - Peroneal tendonitis, right   THERAPY DIAG:  Pain in left ankle and joints of left foot  Muscle weakness (generalized)  Pain in right ankle and joints of right foot  Chronic pain of left knee  Other abnormalities of gait and mobility  Rationale for Evaluation and Treatment: Rehabilitation  ONSET DATE: Chronic  SUBJECTIVE:   SUBJECTIVE STATEMENT: Patient reports that she had some knee pain after the last session.   EVAL: Pt presents to PT with reports of chronic bilateral knee pain in presence of previous ACL repairs. She is well known to therapist and has been seen for both ACL recoveries in past. She currently has significant pain when standing for long periods of time, or when walking/squatting. Denies referral of symptoms past knee, pain is sharp and along bilateral knee joint lines.   PERTINENT HISTORY: Bipolar disorder, Depression, intellectual disability, bilateral ACL reconstruction   PAIN:  Are you having pain?  Yes: NPRS scale: currently 0/10, not at the moment Worst: 10/10 Pain location: bilateral inferior knee Pain description: sharp, sore Aggravating factors: standing, stairs,  Relieving factors: rest  PRECAUTIONS: None  RED FLAGS: None   WEIGHT BEARING RESTRICTIONS: No  FALLS:  Has patient fallen in last 6 months? -   LIVING ENVIRONMENT: Lives with: lives with their family Lives in: House/apartment Stairs: 4 STE - handrail  Has  following equipment at home: None  OCCUPATION: Disability  PLOF: Independent with basic ADLs  PATIENT GOALS: decrease knee pain, improve strength and be able to work out  NEXT MD VISIT: PRN  OBJECTIVE:  Note: Objective measures were completed at Evaluation unless otherwise noted.  DIAGNOSTIC FINDINGS: N/A  PATIENT SURVEYS:  LEFS: 34/80 05/28/2024: 54/80  COGNITION: Overall cognitive status: Within functional limits for tasks  assessed     SENSATION: WFL  POSTURE: rounded shoulders, forward head, increased lumbar lordosis, and large body habitus  PALPATION: TTP to distal quad  LOWER EXTREMITY ROM:  Active ROM Right eval Left eval 08/19/24 B  Hip flexion     Hip extension     Hip abduction     Hip adduction     Hip internal rotation     Hip external rotation     Knee flexion 114 118 120d  Knee extension 0 5 0d  Ankle dorsiflexion     Ankle plantarflexion     Ankle inversion     Ankle eversion      (Blank rows = not tested)  LOWER EXTREMITY MMT:  MMT Right eval Left eval Right 04/28/24 Left 04/28/24 08/19/24 B  Hip flexion 3+ 3+ 4 4 4   Hip extension       Hip abduction 3+ 3+ 4 4 4   Hip adduction       Hip internal rotation       Hip external rotation       Knee flexion 3+ 3+ 4 4 4   Knee extension 3+ 3+ 4 4 4   Ankle dorsiflexion     4  Ankle plantarflexion     4  Ankle inversion       Ankle eversion        (Blank rows = not tested)  LOWER EXTREMITY SPECIAL TESTS:  Knee special tests: Lachman Test: negative  FUNCTIONAL TESTS: 30 Second Sit to Stand: 8 reps 04/09/2024: 13 reps  06/04/24:  SLS: 3 sec each  GAIT: Distance walked: 37ft Assistive device utilized: None Level of assistance: Complete Independence Comments: decreased gait speed, antalgia L  TREATMENT: [facility] Adult PT Treatment:                                                DATE: 08/24/24 Therapeutic Exercise: Bike level 3 x 5 mins Seated hamstring curl 3x10 45# Seated knee extension 2x10 35# 3x6 SL - 15# Slant board gastroc stretch 2x1' Recumbent leg press 80# 2x10 Therapeutic Activity: Sled push/pull 50# x4 laps STS in chair with 10# KB - 3x5 Cybex hip abduction 37.5# 2x10 BIL  [facility] Adult PT Treatment:                                                DATE: 08/19/24 Therapeutic Activity: B gastroc stretch with supination bias 30s x2 TKE with adduction 2s hold 15x Re-assessment of progress, ROM and LEFS for  re-certification  [facility] Adult PT Treatment:  DATE: 08/05/24 Therapeutic Exercise: Bike level 3 x 5 mins Seated hamstring curl 3x10 45# Seated knee extension 2x10 35# 3x6 SL - 15# Slant board gastroc stretch 2x1' Neuromuscular re-ed: Semi-Tandem stance on foam 2x30 each - occasional LOB, able to self correct Side stepping on foam beam x 3 laps - CGA with several instance of LOB Therapeutic Activity: Sled push/pull 50# x4 laps STS to raised table - 3x5    PATIENT EDUCATION:  Education details: eval findings, LEFS, HEP, POC Person educated: Patient Education method: Explanation, Demonstration, and Handouts Education comprehension: verbalized understanding and returned demonstration  HOME EXERCISE PROGRAM: Access Code: [email or web address] URL: [email or web address] Date: 08/19/2024 Prepared by: Jeffrey Ziemba  Exercises - Supine Heel Slide  - 1 x daily - 7 x weekly - 2 sets - 10 reps - 5 sec hold - Supine Quadricep Sets  - 1 x daily - 7 x weekly - 2 sets - 10 reps - 5 sec hold - Active Straight Leg Raise with Quad Set  - 1 x daily - 7 x weekly - 3-4 sets - 5 reps - Sidelying Hip Abduction  - 1 x daily - 7 x weekly - 3-4 sets - 5 reps - Tandem Stance  - 1 x daily - 7 x weekly - 2-3 reps - 30 sec hold - Heel Toe Raises with Counter Support  - 1 x daily - 7 x weekly - 2 sets - 15 reps - Long Sitting Ankle Inversion with Anchored Resistance  - 1 x daily - 7 x weekly - 3 sets - 10 reps - green band hold - Seated Calf Raise With Small Ball at Heels  - 1 x daily - 7 x weekly - 3 sets - 15 reps - Knee extension with ball squeeze  - 1 x daily - 5 x weekly - 2 sets - 15 reps - Gastroc Stretch on Wall  - 1 x daily - 5 x weekly - 1 sets - 2 reps - 30s hold  ASSESSMENT:  CLINICAL IMPRESSION: Patient presents to PT reporting she had some pain after her last session in her knees. Session today continued to focus on ankle and knee  strengthening as well as stretching. Patient was able to tolerate all prescribed exercises with no adverse effects. Patient continues to benefit from skilled PT services and should be progressed as able to improve functional independence.   EVAL: Patient is a 34 y.o. F who was seen today for physical therapy evaluation and treatment for bilateral knee pain and discomfort. Physical findings are consistent with injury/surgical hx along with bilateral LE weakness particularly in lateral hip and quad. Her LEFS score shows moderate disability in performance of home ADLs and community level activities. Pt would benefit from skilled PT services with specific focus on LE strengthening for improving movement and decreasing knee pain.   OBJECTIVE IMPAIRMENTS: Abnormal gait, decreased activity tolerance, decreased mobility, difficulty walking, decreased ROM, decreased strength, and pain  ACTIVITY LIMITATIONS: carrying, lifting, sitting, standing, squatting, stairs, transfers, and locomotion level  PARTICIPATION LIMITATIONS: meal prep, cleaning, driving, shopping, community activity, and yard work  PERSONAL FACTORS: Time since onset of injury/illness/exacerbation and 3+ comorbidities: Bipolar disorder, Depression, intellectual disability, bilateral ACL reconstruction  are also affecting patient's functional outcome.   REHAB POTENTIAL: Good  CLINICAL DECISION MAKING: Evolving/moderate complexity  EVALUATION COMPLEXITY: Moderate   GOALS: Goals reviewed with patient? No  SHORT TERM GOALS: Target date: 03/25/2024   Pt will be compliant and knowledgeable with initial HEP for improved  comfort and carryover Baseline: initial HEP given  Goal status: MET Pt reports compliance 03/26/24  2.  Pt will self report bilateral knee pain no greater than 7/10 for improved comfort and functional ability Baseline: 10/10 at worst 04/09/2024: 6/10 Goal status: MET  LONG TERM GOALS: Target date: 10/17/24   Pt will improve  LEFS to no less than 50/80 as proxy for functional improvement with home ADLs and higher level community activity Baseline: 34/80 04/09/2024: 36/80 05/28/2024: 54/80 08/19/24 19/80 including B ankles Goal status: Ongoing  2.  Pt will self report bilateral knee pain no greater than 3/10 for improved comfort and functional ability Baseline: 10/10 at worst 04/09/2024: 6/10 04/28/2024: 5/10 Goal status: IN PROGRESS   3.  Pt will increase 30 Second Sit to Stand rep count to no less than 12 reps for improved balance, strength, and functional mobility Baseline: 8 reps 04/09/2024: 12 reps 05/26/2024: 14 reps Goal status: MET   4.  Pt will improve all LE MMT to no less than 4/5 for all tested motions for improved knee stabilization and decreased pain Baseline: see MMT chart Goal status: Ongoing including ankles  5.  Pt will be able to squat with hands to floor without increase in bilateral knee pain for improved comfort and functional ability  Baseline: unable 04/28/2024: slight loss of balance, able to pick up small box Goal status: PARTIALLY MET    6.  Pt will be able to ambulate 2 miles without increase in knee pain or any instance of knee buckling for improve comfort and function Baseline: 1 miles Goal status: Ongoing  7.  Pt will improve bilateral SLS time to at least 20 seconds each for improved balance and ankle stability Baseline: 3 seconds each Goal status: Ongoing   PLAN:  PT FREQUENCY: 1x/week  PT DURATION: 8 weeks  PLANNED INTERVENTIONS: 97164- PT Re-evaluation, 97110-Therapeutic exercises, 97530- Therapeutic activity, [id number]- Neuromuscular re-education, 97535- Self Care, [id number]- Manual therapy, [id number]- Gait training, [id number]- Electrical stimulation (unattended), [id number]- Electrical stimulation (manual), 97016- Vasopneumatic device, Cryotherapy, and Moist heat  PLAN FOR NEXT SESSION: assess HEP response, quad and hip strengthening, progress functional lifting movements    Corean Pouch, PTA 08/24/2024, 11:24 AM    "

## 2024-08-26 ENCOUNTER — Ambulatory Visit: Admitting: Podiatry

## 2024-09-01 ENCOUNTER — Ambulatory Visit

## 2024-09-01 ENCOUNTER — Emergency Department (HOSPITAL_COMMUNITY)

## 2024-09-01 ENCOUNTER — Emergency Department (HOSPITAL_COMMUNITY)
Admission: EM | Admit: 2024-09-01 | Discharge: 2024-09-02 | Disposition: A | Discharge status: Home / Self Care | Attending: Emergency Medicine | Admitting: Emergency Medicine

## 2024-09-01 DIAGNOSIS — X501XXA Overexertion from prolonged static or awkward postures, initial encounter: Secondary | ICD-10-CM | POA: Insufficient documentation

## 2024-09-01 DIAGNOSIS — S99911A Unspecified injury of right ankle, initial encounter: Secondary | ICD-10-CM | POA: Insufficient documentation

## 2024-09-01 DIAGNOSIS — M25571 Pain in right ankle and joints of right foot: Secondary | ICD-10-CM | POA: Diagnosis present

## 2024-09-01 LAB — CBG MONITORING, ED: Glucose-Capillary: 88 mg/dL (ref 70–99)

## 2024-09-01 NOTE — Therapy (Incomplete)
 " OUTPATIENT PHYSICAL THERAPY TREATMENT      Patient Name: Cindy Phillips MRN: [id number] DOB:1991/03/25, 34 y.o., female Today's Date: 09/01/2024  END OF SESSION:      Past Medical History:  Diagnosis Date   Arthritis    Bipolar 1 disorder (HCC)    Bowel incontinence    Depression    Diabetes mellitus without complication (HCC)    Headache    migraines   Mental disability    Sleep apnea    Past Surgical History:  Procedure Laterality Date   ANTERIOR CRUCIATE LIGAMENT REPAIR Right 05/11/2022   Procedure: RIGHT KNEE ARTHROSCOPY; LATERAL MENISECTOMY; ANTERIOR CRUCIATE LIGAMENT (ACL) REPAIR; CHONDROPLASTY;  Surgeon: Shari Sieving, MD;  Location: [facility];  Service: Orthopedics;  Laterality: Right;   FOOT SURGERY     KNEE ARTHROSCOPY W/ ACL RECONSTRUCTION Left 10/12/2020   TOOTH EXTRACTION     Patient Active Problem List   Diagnosis Date Noted   Bipolar affective disorder (HCC) 07/29/2024   Pain in left elbow 07/27/2020   Prediabetes 02/03/2019   Obstructive sleep apnea 04/23/2018   Other fatigue 11/26/2017   Shortness of breath on exertion 11/26/2017   Type 2 diabetes mellitus without complication, without long-term current use of insulin  (HCC) 11/26/2017   Other hyperlipidemia 11/26/2017   Vitamin D  deficiency 11/26/2017   Schizoaffective disorder, bipolar type (HCC) 09/14/2014   Intellectual disability 09/10/2014    PCP: Leigh Lung, MD  REFERRING PROVIDER:  02/19/2024 - Knees - Shari Sieving, MD 05/22/2024 - Verlena GLENWOOD Joya Asberry, DPM   REFERRING DIAG:  02/19/2024 - Hx of ACL Reconstruction  05/22/2024 -  M76.72 (ICD-10-CM) - Peroneal tendonitis, left M76.71 (ICD-10-CM) - Peroneal tendonitis, right   THERAPY DIAG:  No diagnosis found.  Rationale for Evaluation and Treatment: Rehabilitation  ONSET DATE: Chronic  SUBJECTIVE:   SUBJECTIVE STATEMENT: ***  EVAL: Pt presents to PT with reports of chronic bilateral knee pain in presence of previous  ACL repairs. She is well known to therapist and has been seen for both ACL recoveries in past. She currently has significant pain when standing for long periods of time, or when walking/squatting. Denies referral of symptoms past knee, pain is sharp and along bilateral knee joint lines.   PERTINENT HISTORY: Bipolar disorder, Depression, intellectual disability, bilateral ACL reconstruction   PAIN:  Are you having pain?  Yes: NPRS scale: currently 0/10, not at the moment Worst: 10/10 Pain location: bilateral inferior knee Pain description: sharp, sore Aggravating factors: standing, stairs,  Relieving factors: rest  PRECAUTIONS: None  RED FLAGS: None   WEIGHT BEARING RESTRICTIONS: No  FALLS:  Has patient fallen in last 6 months? -   LIVING ENVIRONMENT: Lives with: lives with their family Lives in: House/apartment Stairs: 4 STE - handrail  Has following equipment at home: None  OCCUPATION: Disability  PLOF: Independent with basic ADLs  PATIENT GOALS: decrease knee pain, improve strength and be able to work out  NEXT MD VISIT: PRN  OBJECTIVE:  Note: Objective measures were completed at Evaluation unless otherwise noted.  DIAGNOSTIC FINDINGS: N/A  PATIENT SURVEYS:  LEFS: 34/80 05/28/2024: 54/80  COGNITION: Overall cognitive status: Within functional limits for tasks assessed     SENSATION: WFL  POSTURE: rounded shoulders, forward head, increased lumbar lordosis, and large body habitus  PALPATION: TTP to distal quad  LOWER EXTREMITY ROM:  Active ROM Right eval Left eval 08/19/24 B  Hip flexion     Hip extension     Hip abduction  Hip adduction     Hip internal rotation     Hip external rotation     Knee flexion 114 118 120d  Knee extension 0 5 0d  Ankle dorsiflexion     Ankle plantarflexion     Ankle inversion     Ankle eversion      (Blank rows = not tested)  LOWER EXTREMITY MMT:  MMT Right eval Left eval Right 04/28/24 Left 04/28/24  08/19/24 B  Hip flexion 3+ 3+ 4 4 4   Hip extension       Hip abduction 3+ 3+ 4 4 4   Hip adduction       Hip internal rotation       Hip external rotation       Knee flexion 3+ 3+ 4 4 4   Knee extension 3+ 3+ 4 4 4   Ankle dorsiflexion     4  Ankle plantarflexion     4  Ankle inversion       Ankle eversion        (Blank rows = not tested)  LOWER EXTREMITY SPECIAL TESTS:  Knee special tests: Lachman Test: negative  FUNCTIONAL TESTS: 30 Second Sit to Stand: 8 reps 04/09/2024: 13 reps  06/04/24:  SLS: 3 sec each  GAIT: Distance walked: 21ft Assistive device utilized: None Level of assistance: Complete Independence Comments: decreased gait speed, antalgia L  TREATMENT: [facility] Adult PT Treatment:                                                DATE: 08/24/24 Therapeutic Exercise: Bike level 3 x 5 mins Seated hamstring curl 3x10 45# Seated knee extension 2x10 35# 3x6 SL - 15# Slant board gastroc stretch 2x1' Recumbent leg press 80# 2x10 Therapeutic Activity: Sled push/pull 50# x4 laps STS in chair with 10# KB - 3x5 Cybex hip abduction 37.5# 2x10 BIL  [facility] Adult PT Treatment:                                                DATE: 08/19/24 Therapeutic Activity: B gastroc stretch with supination bias 30s x2 TKE with adduction 2s hold 15x Re-assessment of progress, ROM and LEFS for re-certification  [facility] Adult PT Treatment:                                                DATE: 08/05/24 Therapeutic Exercise: Bike level 3 x 5 mins Seated hamstring curl 3x10 45# Seated knee extension 2x10 35# 3x6 SL - 15# Slant board gastroc stretch 2x1' Neuromuscular re-ed: Semi-Tandem stance on foam 2x30 each - occasional LOB, able to self correct Side stepping on foam beam x 3 laps - CGA with several instance of LOB Therapeutic Activity: Sled push/pull 50# x4 laps STS to raised table - 3x5    PATIENT EDUCATION:  Education details: eval findings, LEFS, HEP, POC Person educated:  Patient Education method: Explanation, Demonstration, and Handouts Education comprehension: verbalized understanding and returned demonstration  HOME EXERCISE PROGRAM: Access Code: [email or web address] URL: [email or web address] Date: 08/19/2024 Prepared by: Jeffrey Ziemba  Exercises - Supine Heel Slide  - 1  x daily - 7 x weekly - 2 sets - 10 reps - 5 sec hold - Supine Quadricep Sets  - 1 x daily - 7 x weekly - 2 sets - 10 reps - 5 sec hold - Active Straight Leg Raise with Quad Set  - 1 x daily - 7 x weekly - 3-4 sets - 5 reps - Sidelying Hip Abduction  - 1 x daily - 7 x weekly - 3-4 sets - 5 reps - Tandem Stance  - 1 x daily - 7 x weekly - 2-3 reps - 30 sec hold - Heel Toe Raises with Counter Support  - 1 x daily - 7 x weekly - 2 sets - 15 reps - Long Sitting Ankle Inversion with Anchored Resistance  - 1 x daily - 7 x weekly - 3 sets - 10 reps - green band hold - Seated Calf Raise With Small Ball at Heels  - 1 x daily - 7 x weekly - 3 sets - 15 reps - Knee extension with ball squeeze  - 1 x daily - 5 x weekly - 2 sets - 15 reps - Gastroc Stretch on Wall  - 1 x daily - 5 x weekly - 1 sets - 2 reps - 30s hold  ASSESSMENT:  CLINICAL IMPRESSION: ***  EVAL: Patient is a 34 y.o. F who was seen today for physical therapy evaluation and treatment for bilateral knee pain and discomfort. Physical findings are consistent with injury/surgical hx along with bilateral LE weakness particularly in lateral hip and quad. Her LEFS score shows moderate disability in performance of home ADLs and community level activities. Pt would benefit from skilled PT services with specific focus on LE strengthening for improving movement and decreasing knee pain.   OBJECTIVE IMPAIRMENTS: Abnormal gait, decreased activity tolerance, decreased mobility, difficulty walking, decreased ROM, decreased strength, and pain  ACTIVITY LIMITATIONS: carrying, lifting, sitting, standing, squatting, stairs, transfers, and  locomotion level  PARTICIPATION LIMITATIONS: meal prep, cleaning, driving, shopping, community activity, and yard work  PERSONAL FACTORS: Time since onset of injury/illness/exacerbation and 3+ comorbidities: Bipolar disorder, Depression, intellectual disability, bilateral ACL reconstruction  are also affecting patient's functional outcome.   REHAB POTENTIAL: Good  CLINICAL DECISION MAKING: Evolving/moderate complexity  EVALUATION COMPLEXITY: Moderate   GOALS: Goals reviewed with patient? No  SHORT TERM GOALS: Target date: 03/25/2024   Pt will be compliant and knowledgeable with initial HEP for improved comfort and carryover Baseline: initial HEP given  Goal status: MET Pt reports compliance 03/26/24  2.  Pt will self report bilateral knee pain no greater than 7/10 for improved comfort and functional ability Baseline: 10/10 at worst 04/09/2024: 6/10 Goal status: MET  LONG TERM GOALS: Target date: 10/17/24   Pt will improve LEFS to no less than 50/80 as proxy for functional improvement with home ADLs and higher level community activity Baseline: 34/80 04/09/2024: 36/80 05/28/2024: 54/80 08/19/24 19/80 including B ankles Goal status: Ongoing  2.  Pt will self report bilateral knee pain no greater than 3/10 for improved comfort and functional ability Baseline: 10/10 at worst 04/09/2024: 6/10 04/28/2024: 5/10 Goal status: IN PROGRESS   3.  Pt will increase 30 Second Sit to Stand rep count to no less than 12 reps for improved balance, strength, and functional mobility Baseline: 8 reps 04/09/2024: 12 reps 05/26/2024: 14 reps Goal status: MET   4.  Pt will improve all LE MMT to no less than 4/5 for all tested motions for improved knee stabilization and decreased  pain Baseline: see MMT chart Goal status: Ongoing including ankles  5.  Pt will be able to squat with hands to floor without increase in bilateral knee pain for improved comfort and functional ability  Baseline:  unable 04/28/2024: slight loss of balance, able to pick up small box Goal status: PARTIALLY MET    6.  Pt will be able to ambulate 2 miles without increase in knee pain or any instance of knee buckling for improve comfort and function Baseline: 1 miles Goal status: Ongoing  7.  Pt will improve bilateral SLS time to at least 20 seconds each for improved balance and ankle stability Baseline: 3 seconds each Goal status: Ongoing   PLAN:  PT FREQUENCY: 1x/week  PT DURATION: 8 weeks  PLANNED INTERVENTIONS: 97164- PT Re-evaluation, 97110-Therapeutic exercises, 97530- Therapeutic activity, [id number]- Neuromuscular re-education, 97535- Self Care, [id number]- Manual therapy, [id number]- Gait training, [id number]- Electrical stimulation (unattended), [id number]- Electrical stimulation (manual), 97016- Vasopneumatic device, Cryotherapy, and Moist heat  PLAN FOR NEXT SESSION: assess HEP response, quad and hip strengthening, progress functional lifting movements    Alm JAYSON Kingdom, PT 09/01/2024, 8:00 AM    "

## 2024-09-01 NOTE — ED Notes (Signed)
 Spoke with pt.'s mother about why the patient has been in the waiting room for 8 hours and is unable to get herself to the bathroom. Mother also concerned about pt.'s blood sugar since she had been in the lobby for so long. Writer explained that we see patients based on level of acuity. The patient has had vital signs checked by our first look rounder and we will be happy to check her blood sugar if she is concerned. Pt. States that she asked the previous shift for help getting to the restroom and no one help her. I explained that that is not a reflection of our [employer] values. PT. And mother given a pt. Experience card.

## 2024-09-01 NOTE — ED Triage Notes (Signed)
 Patient [place], mechanical fall, deneis hitting head/LOC/thiners. C/o right ankle pain/swelling. Patient is alert and oriented x 4. Airway patent, respirations even and unlabored. Skin normal, warm and dry. PMS intact no deformities.

## 2024-09-02 MED ORDER — TRAMADOL HCL 50 MG PO TABS: 50.0000 mg | ORAL_TABLET | Freq: Four times a day (QID) | ORAL | 0 refills | Status: DC | PRN

## 2024-09-02 MED ORDER — TRAMADOL HCL 50 MG PO TABS: 50.0000 mg | ORAL_TABLET | Freq: Four times a day (QID) | ORAL | 0 refills | Status: AC | PRN

## 2024-09-02 MED ORDER — OXYCODONE-ACETAMINOPHEN 5-325 MG PO TABS
1.0000 | ORAL_TABLET | Freq: Once | ORAL | Status: AC
  Administered 2024-09-02: 1 via ORAL
  Filled 2024-09-02: qty 1

## 2024-09-02 NOTE — ED Provider Notes (Signed)
 "  EMERGENCY DEPARTMENT AT [facility] Provider Note   CSN: [id number] Arrival date & time: 09/01/24  1212     Patient presents with: Fall and Ankle Pain   Cindy Phillips is a 34 y.o. female.   The history is provided by the patient and medical records.  Fall  Ankle Pain  34 y.o. F here with right ankle pain.  She states she lost her footing and right ankle twisted causing her to fall.  Denies head injury or LOC.  Pain along right lateral ankle.  Denies numbness/weakness.  Cannot bear weight due to pain.  No meds PTA.  Prior to Admission medications  Medication Sig Start Date End Date Taking? Authorizing Provider  amLODipine  (NORVASC ) 5 MG tablet Take 1 tablet (5 mg total) by mouth daily. 08/04/24   Rollene Katz, MD  atorvastatin  (LIPITOR) 40 MG tablet Take 40 mg by mouth every evening.    [provider]  baclofen  (LIORESAL ) 10 MG tablet Take 10 mg by mouth daily as needed (for headaches). 05/06/23   [provider]  gabapentin  (NEURONTIN ) 300 MG capsule Take 300 mg by mouth every evening. 04/17/22   [provider]  lamoTRIgine  (LAMICTAL ) 25 MG tablet Take 2 tablets (50 mg total) by mouth daily. 08/04/24   Rollene Katz, MD  meloxicam  (MOBIC ) 15 MG tablet Take 1 tablet (15 mg total) by mouth daily. 05/20/24   Sikora, Rebecca, DPM  metFORMIN  (GLUCOPHAGE ) 500 MG tablet Take 1 tablet (500 mg total) by mouth daily with breakfast. 08/04/24   Rollene Katz, MD  OLANZapine  (ZYPREXA ) 20 MG tablet Take 1 tablet (20 mg total) by mouth at bedtime. 08/03/24   Rollene Katz, MD  propranolol  (INDERAL ) 10 MG tablet Take 1 tablet (10 mg total) by mouth 3 (three) times daily. 08/03/24   Rollene Katz, MD  traMADol  (ULTRAM ) 50 MG tablet Take 1 tablet (50 mg total) by mouth every 6 (six) hours as needed. 09/02/24   Jarold Olam HERO, PA-C    Allergies: Patient has no known allergies.    Review of Systems  Musculoskeletal:   Positive for arthralgias.  All other systems reviewed and are negative.   Updated Vital Signs BP (!) 158/100 (BP Location: Left Arm)   Pulse 79   Temp 97.6 F (36.4 C)   Resp 16   SpO2 98%   Physical Exam Vitals and nursing note reviewed.  Constitutional:      Appearance: She is well-developed.  HENT:     Head: Normocephalic and atraumatic.  Eyes:     Conjunctiva/sclera: Conjunctivae normal.     Pupils: Pupils are equal, round, and reactive to light.  Cardiovascular:     Rate and Rhythm: Normal rate and regular rhythm.     Heart sounds: Normal heart sounds.  Pulmonary:     Effort: Pulmonary effort is normal.     Breath sounds: Normal breath sounds.  Abdominal:     General: Bowel sounds are normal.     Palpations: Abdomen is soft.  Musculoskeletal:        General: Normal range of motion.     Cervical back: Normal range of motion.     Comments: Swelling surrounding right lateral malleolus, DP pulse intact, able to wiggle toes on command, normal sensation distally  Skin:    General: Skin is warm and dry.  Neurological:     Mental Status: She is alert and oriented to person, place, and time.     (all labs  ordered are listed, but only abnormal results are displayed) Labs Reviewed  CBG MONITORING, ED    EKG: None  Radiology: DG Ankle Complete Right Result Date: 09/01/2024 EXAM: 3 OR MORE VIEW(S) XRAY OF THE ANKLE 09/01/2024 02:12:14 PM CLINICAL HISTORY: Ankle pain COMPARISON: None available. FINDINGS: BONES AND JOINTS: No widening of the ankle mortise. The talar dome is intact. Focal cortical irregularity and defect along the anterior surface of the tibial plafond. No acute fracture. No malalignment. No joint effusion. SOFT TISSUES: Moderate soft tissue swelling about the ankle. IMPRESSION: 1. Moderate soft tissue swelling about the ankle. Focal cortical irregularity and defect along the anterior surface of the tibial plafond, worrisome for an osteochondral defect. 2. No  ankle dislocation. Electronically signed by: Rogelia Myers MD 09/01/2024 02:53 PM EST RP Workstation: [id number]     .Ortho Injury Treatment  Date/Time: 09/02/2024 12:59 AM  Performed by: Jarold Olam HERO, PA-C Authorized by: Jarold Olam HERO, PA-C   Consent:    Consent obtained:  Verbal   Consent given by:  Patient   Risks discussed:  Fracture, nerve damage and stiffness   Alternatives discussed:  No treatmentInjury location: ankle Location details: right ankle Injury type: fracture Fracture type: tibial plafond Pre-procedure neurovascular assessment: neurovascularly intact  Anesthesia: Local anesthesia used: no  Patient sedated: NoManipulation performed: no Immobilization: splint Splint type: short leg Splint Applied by: ED Provider and Ortho Tech Post-procedure neurovascular assessment: post-procedure neurovascularly intact      Medications Ordered in the ED  oxyCODONE -acetaminophen  (PERCOCET/ROXICET) 5-325 MG per tablet 1 tablet (1 tablet Oral Given 09/02/24 0022)                                    Medical Decision Making Amount and/or Complexity of Data Reviewed Radiology: ordered and independent interpretation performed.  Risk Prescription drug management.   34 year old female here with right ankle pain after a fall.  This sounds mechanical in nature.  There was no head injury or loss of consciousness.  She has large amount of swelling surrounding the right lateral malleolus but no acute bony deformity.  Her foot remains neurovascularly intact.  She does have cortical irregularity along the tibial plafond concerning for possible fracture.  She will be placed in short leg splint with crutches and referred to orthopedics for follow-up.  Can return here for new concerns.  Final diagnoses:  Ankle injury, right, initial encounter    ED Discharge Orders          Ordered    traMADol  (ULTRAM ) 50 MG tablet  Every 6 hours PRN,   Status:  Discontinued        09/02/24  0044    traMADol  (ULTRAM ) 50 MG tablet  Every 6 hours PRN        09/02/24 0047               Jarold Olam HERO, PA-C 09/02/24 [id number]    Raford Lenis, MD 09/02/24 [phone]  "

## 2024-09-02 NOTE — Progress Notes (Signed)
 Orthopedic Tech Progress Note Patient Details:  Cindy Phillips 1991-01-24 [id number] Applied posterior short leg splint with assistance from PA and adjusted crutches for pt per order.  Ortho Devices Type of Ortho Device: Ace wrap, Cotton web roll, Crutches, Post (short) splint Splint Material: Fiberglass Ortho Device/Splint Location: RLE, BLE Ortho Device/Splint Interventions: Ordered, Application, Adjustment   Post Interventions Patient Tolerated: Well Instructions Provided: Adjustment of device, Poper ambulation with device, Care of device  Morna Pink 09/02/2024, 1:20 AM

## 2024-09-02 NOTE — Discharge Instructions (Signed)
 Take the prescribed medication as directed.  Try to keep ankle elevated to keep swelling down. Follow-up with Dr. Jerri-- call his office to schedule appt. Return to the ED for new or worsening symptoms.

## 2024-09-08 ENCOUNTER — Ambulatory Visit

## 2024-09-08 ENCOUNTER — Encounter: Payer: Self-pay | Admitting: Orthopedic Surgery

## 2024-09-08 ENCOUNTER — Ambulatory Visit: Admitting: Orthopedic Surgery

## 2024-09-08 DIAGNOSIS — S93491A Sprain of other ligament of right ankle, initial encounter: Secondary | ICD-10-CM

## 2024-09-08 NOTE — Progress Notes (Signed)
 "  Office Visit Note   Patient: Cindy Phillips           Date of Birth: Jan 01, 1991           MRN: [id number] Visit Date: 09/08/2024              Requested by: Leigh Lung, MD [address] [place],  [place] [address] PCP: Leigh Lung, MD  Chief Complaint  Patient presents with   Right Ankle - Injury    Patient fell on 09/01/24      HPI: Discussed the use of AI scribe software for clinical note transcription with the patient, who gave verbal consent to proceed.  History of Present Illness Cindy Phillips is a 34 year old female with prior right ACL reconstruction who presents with right ankle pain and swelling following an acute injury.  She describes acute onset pain and swelling localized to the right lateral ankle. Discomfort is most pronounced laterally, with only mild medial ankle pain. She denies radiation of pain to the knee or midfoot.  Swelling exacerbates her pain, and she experiences partial relief with elevation and compression. She is currently ambulating with crutches.  She has a history of right ACL tear and underwent reconstruction approximately two to three years ago.     Assessment & Plan: Visit Diagnoses:  1. Sprain of anterior talofibular ligament of right ankle, initial encounter     Plan: Assessment and Plan Assessment & Plan Right lateral ankle ligament sprain Acute sprain with significant swelling and tenderness over the anterior talofibular ligament. No fracture on radiographs. Conservative management expected to heal in three months. Surgery not indicated. - Placed in short boot for support and ambulation. - Applied Ace wrap for compression. - Advised elevation to reduce swelling. - Recommended NSAIDs for pain and swelling. - Ordered outpatient physical therapy for rehabilitation. - Instructed to increase activities as tolerated. - Anticipated boot discontinuation in one month.  Primary osteoarthritis of the right ankle Radiographs  show anterior osteophytic spurring consistent with osteoarthritis. Joint congruent without acute fracture.      Follow-Up Instructions: Return if symptoms worsen or fail to improve.   Ortho Exam  Patient is alert, oriented, no adenopathy, well-dressed, normal affect, normal respiratory effort. Physical Exam EXTREMITIES: Swelling of the lateral ankle with maximal tenderness over the anterior talofibular ligament. Deltoid and syndesmosis are not tender to palpation. Proximal fibula is not tender to palpation. Anterior drawer test is stable.      Imaging: No results found. No images are attached to the encounter.  Labs: Lab Results  Component Value Date   HGBA1C 5.1 07/31/2024   HGBA1C 6.4 (H) 04/07/2023   HGBA1C 5.7 (H) 05/02/2022   REPTSTATUS 08/12/2023 FINAL 08/11/2023   CULT MULTIPLE SPECIES PRESENT, SUGGEST RECOLLECTION (A) 08/11/2023     Lab Results  Component Value Date   ALBUMIN 4.2 07/28/2024   ALBUMIN 4.5 06/13/2023   ALBUMIN 4.4 05/10/2023    Lab Results  Component Value Date   MG 2.0 04/04/2023   Lab Results  Component Value Date   VD25OH 36.73 07/31/2024   VD25OH 34.1 01/06/2020   VD25OH 30.7 07/27/2019    No results found for: PREALBUMIN    Latest Ref Rng & Units 07/28/2024    8:00 PM 06/13/2023    8:24 PM 05/10/2023    5:05 PM  CBC EXTENDED  WBC 4.0 - 10.5 K/uL 9.4  15.8  7.7   RBC 3.87 - 5.11 MIL/uL 3.94  4.59  4.16   Hemoglobin 12.0 - 15.0 g/dL 88.3  87.2  87.9   HCT 36.0 - 46.0 % 34.9  40.3  36.6   Platelets 150 - 400 K/uL 210  295  226   NEUT# 1.7 - 7.7 K/uL 6.8  12.0    Lymph# 0.7 - 4.0 K/uL 1.9  2.6       There is no height or weight on file to calculate BMI.  Orders:  Orders Placed This Encounter  Procedures   Ambulatory referral to Physical Therapy   No orders of the defined types were placed in this encounter.    Procedures: No procedures performed  Clinical Data: No additional findings.  ROS:  All other systems  negative, except as noted in the HPI. Review of Systems  Objective: Vital Signs: There were no vitals taken for this visit.  Specialty Comments:  No specialty comments available.  PMFS History: Patient Active Problem List   Diagnosis Date Noted   Bipolar affective disorder (HCC) 07/29/2024   Pain in left elbow 07/27/2020   Prediabetes 02/03/2019   Obstructive sleep apnea 04/23/2018   Other fatigue 11/26/2017   Shortness of breath on exertion 11/26/2017   Type 2 diabetes mellitus without complication, without long-term current use of insulin  (HCC) 11/26/2017   Other hyperlipidemia 11/26/2017   Vitamin D  deficiency 11/26/2017   Schizoaffective disorder, bipolar type (HCC) 09/14/2014   Intellectual disability 09/10/2014   Past Medical History:  Diagnosis Date   Arthritis    Bipolar 1 disorder (HCC)    Bowel incontinence    Depression    Diabetes mellitus without complication (HCC)    Headache    migraines   Mental disability    Sleep apnea     Family History  Problem Relation Age of Onset   High blood pressure Mother    Anxiety disorder Mother    Diabetes Maternal Grandmother     Past Surgical History:  Procedure Laterality Date   ANTERIOR CRUCIATE LIGAMENT REPAIR Right 05/11/2022   Procedure: RIGHT KNEE ARTHROSCOPY; LATERAL MENISECTOMY; ANTERIOR CRUCIATE LIGAMENT (ACL) REPAIR; CHONDROPLASTY;  Surgeon: Shari Sieving, MD;  Location: [facility];  Service: Orthopedics;  Laterality: Right;   FOOT SURGERY     KNEE ARTHROSCOPY W/ ACL RECONSTRUCTION Left 10/12/2020   TOOTH EXTRACTION     Social History   Occupational History   Occupation: Nature Conservation Officer     Comment: [employer]   Tobacco Use   Smoking status: Never   Smokeless tobacco: Never  Vaping Use   Vaping status: Never Used  Substance and Sexual Activity   Alcohol use: Not Currently   Drug use: No   Sexual activity: Not Currently         "

## 2024-09-14 ENCOUNTER — Other Ambulatory Visit: Payer: Self-pay | Admitting: Podiatry

## 2024-09-15 ENCOUNTER — Ambulatory Visit

## 2024-09-16 ENCOUNTER — Ambulatory Visit (INDEPENDENT_AMBULATORY_CARE_PROVIDER_SITE_OTHER): Admitting: Podiatry

## 2024-09-16 ENCOUNTER — Encounter: Payer: Self-pay | Admitting: Podiatry

## 2024-09-16 DIAGNOSIS — M7671 Peroneal tendinitis, right leg: Secondary | ICD-10-CM

## 2024-09-16 MED ORDER — DICLOFENAC SODIUM 75 MG PO TBEC: 75.0000 mg | DELAYED_RELEASE_TABLET | Freq: Two times a day (BID) | ORAL | 0 refills | Status: AC

## 2024-09-16 NOTE — Progress Notes (Signed)
"  °  Subjective:  Patient ID: Wyline LITTIE Curls, female    DOB: [birth year],   MRN: [id number]  Chief Complaint  Patient presents with   Tendonitis    It's worse.  I fell a couple of weeks ago and I went to the ER.  They said I have a tear.  It hurts on the side of it and the back.  It's swollen.    34 y.o. female presents for follow-up of bilateral peroneal tendinitis and foot pain.Relates she is now doing worse. She did roll her right ankle and had been seen in the ED she was given a CAM boot and relates the swelling and pain is much worse. She has been stretching and medications have not helped for pain.  Denies any other pedal complaints. Denies n/v/f/c.   Past Medical History:  Diagnosis Date   Arthritis    Bipolar 1 disorder (HCC)    Bowel incontinence    Depression    Diabetes mellitus without complication (HCC)    Headache    migraines   Mental disability    Sleep apnea     Objective:  Physical Exam: Vascular: DP/PT pulses 2/4 bilateral. CFT <3 seconds. Normal hair growth on digits.   Skin. No lacerations or abrasions bilateral feet.  Musculoskeletal: MMT 5/5 bilateral lower extremities in DF, PF, Inversion and Eversion. Deceased ROM in DF of ankle joint.Most tenderness now to the right ankle Tender over ATFL with signficant edema and pain along peroneal tendons to peroneal insertion. Limited exam due to significant pain.  Neurological: Sensation intact to light touch.   Assessment:   1. Peroneal tendonitis, right        Plan:  Patient was evaluated and treated and all questions answered. X-rays reviewed and discussed with patient. No acute fractures or dislocations noted.  Discussed peroneal tendinitis and treatment options at length with patient Continue with CAM boot given by ED  Diclofenac  sent to pharmacy for pain relief.  Continue with physical therapy that she has been doing for her knee and working on her ankle as well. MRI ordered for furter evaluation   Discussed that if the symptoms do not improve can consider injection vs surgery  Patient to return after MRI    Asberry Failing, DPM    "

## 2024-09-24 ENCOUNTER — Encounter (HOSPITAL_COMMUNITY): Payer: Self-pay | Admitting: Emergency Medicine

## 2024-09-24 ENCOUNTER — Other Ambulatory Visit: Payer: Self-pay

## 2024-09-24 ENCOUNTER — Emergency Department (HOSPITAL_COMMUNITY)
Admission: EM | Admit: 2024-09-24 | Discharge: 2024-09-24 | Disposition: A | Discharge status: Home / Self Care | Attending: Emergency Medicine | Admitting: Emergency Medicine

## 2024-09-24 DIAGNOSIS — Z79899 Other long term (current) drug therapy: Secondary | ICD-10-CM | POA: Insufficient documentation

## 2024-09-24 DIAGNOSIS — M25571 Pain in right ankle and joints of right foot: Secondary | ICD-10-CM | POA: Insufficient documentation

## 2024-09-24 NOTE — ED Triage Notes (Signed)
 Pt bib ems from home with c/o right ankle pain x 3 weeks. Has not been wearing her cam boot, was seen here for the fall and diagnosed with a sprain.

## 2024-09-24 NOTE — ED Provider Notes (Signed)
 " [place] EMERGENCY DEPARTMENT AT [facility] Provider Note   CSN: [id number] Arrival date & time: 09/24/24  0041     Patient presents with: Ankle Pain   CORTEZ STEELMAN is a 34 y.o. female.   The history is provided by the patient and medical records.  Ankle Pain  34 year old female here with right ankle pain.  Familiar to myself from prior visit, I saw her after her initial injury.  States her ankle still hurts.  She is not wearing her cam walker, when asked why she states I did not want to.  It appears she did see orthopedics and was referred to podiatry who ordered an MRI of her foot.  She has not had this done either.  When asked about this she states I dont know.  Prior to Admission medications  Medication Sig Start Date End Date Taking? Authorizing Provider  amLODipine  (NORVASC ) 5 MG tablet Take 1 tablet (5 mg total) by mouth daily. 08/04/24   Rollene Katz, MD  atorvastatin  (LIPITOR) 40 MG tablet Take 40 mg by mouth every evening.    [provider]  baclofen  (LIORESAL ) 10 MG tablet Take 10 mg by mouth daily as needed (for headaches). 05/06/23   [provider]  diclofenac  (VOLTAREN ) 75 MG EC tablet Take 1 tablet (75 mg total) by mouth 2 (two) times daily. 09/16/24   Sikora, Rebecca, DPM  gabapentin  (NEURONTIN ) 300 MG capsule Take 300 mg by mouth every evening. 04/17/22   [provider]  lamoTRIgine  (LAMICTAL ) 25 MG tablet Take 2 tablets (50 mg total) by mouth daily. 08/04/24   Rollene Katz, MD  metFORMIN  (GLUCOPHAGE ) 500 MG tablet Take 1 tablet (500 mg total) by mouth daily with breakfast. 08/04/24   Rollene Katz, MD  OLANZapine  (ZYPREXA ) 20 MG tablet Take 1 tablet (20 mg total) by mouth at bedtime. 08/03/24   Rollene Katz, MD  propranolol  (INDERAL ) 10 MG tablet Take 1 tablet (10 mg total) by mouth 3 (three) times daily. 08/03/24   Rollene Katz, MD  traMADol  (ULTRAM ) 50 MG tablet Take 1 tablet (50 mg  total) by mouth every 6 (six) hours as needed. 09/02/24   Jarold Olam HERO, PA-C    Allergies: Patient has no known allergies.    Review of Systems  Musculoskeletal:  Positive for arthralgias.  All other systems reviewed and are negative.   Updated Vital Signs BP (!) 159/112 (BP Location: Left Arm)   Pulse 82   Temp 97.7 F (36.5 C) (Oral)   Resp 20   Ht 6' (1.829 m)   Wt (!) 142.9 kg   LMP  (LMP Unknown) Comment: I may be starting today.  I normally didn't have periods because I was on Depo.  SpO2 100%   BMI 42.73 kg/m   Physical Exam Vitals and nursing note reviewed.  Constitutional:      Appearance: She is well-developed.  HENT:     Head: Normocephalic and atraumatic.  Eyes:     Conjunctiva/sclera: Conjunctivae normal.     Pupils: Pupils are equal, round, and reactive to light.  Cardiovascular:     Rate and Rhythm: Normal rate and regular rhythm.     Heart sounds: Normal heart sounds.  Pulmonary:     Effort: Pulmonary effort is normal.     Breath sounds: Normal breath sounds.  Abdominal:     General: Bowel sounds are normal.     Palpations: Abdomen is soft.  Musculoskeletal:  General: Normal range of motion.     Cervical back: Normal range of motion.     Comments: Not wearing CAM walker, swelling noted along lateral malleolus; seems to have fairly good ROM but with some mild pain, foot is NVI  Skin:    General: Skin is warm and dry.  Neurological:     Mental Status: She is alert and oriented to person, place, and time.     (all labs ordered are listed, but only abnormal results are displayed) Labs Reviewed - No data to display  EKG: None  Radiology: No results found.   Procedures   Medications Ordered in the ED - No data to display                                  Medical Decision Making  34 year old female who with continued right ankle pain after a fall a few weeks ago.  Has since had follow-up with orthopedics as well as podiatry.   Scheduled for outpatient MRI but has not yet had this done.  She is also not wearing her cam walker.  She does not have any significant deformities on exam, has fairly normal range of motion.  Foot is neurovascular intact.  Explained that no further intervention from the ER, she will need to have her MRI done and follow-up with specialist for ongoing management.  I have stressed importance of wearing her cam walker.  She was prescribed diclofenac  from podiatry, can take this for pain.  Return here for new concerns.  Final diagnoses:  Acute right ankle pain    ED Discharge Orders     None          Jarold Olam CHRISTELLA DEVONNA 09/24/24 0140    Griselda Norris, MD 09/24/24 0300  "

## 2024-09-24 NOTE — Discharge Instructions (Signed)
 You need to be wearing your walking boot. Have your MRI done and follow-up with podiatry.

## 2024-10-01 ENCOUNTER — Other Ambulatory Visit
